# Patient Record
Sex: Female | Born: 1935
Health system: Southern US, Community
[De-identification: ages and names within clinical notes are randomized; demographics above are authoritative.]

## PROBLEM LIST (undated history)

## (undated) DIAGNOSIS — Z7901 Long term (current) use of anticoagulants: Secondary | ICD-10-CM

## (undated) DIAGNOSIS — I482 Chronic atrial fibrillation, unspecified: Secondary | ICD-10-CM

## (undated) DIAGNOSIS — I10 Essential (primary) hypertension: Secondary | ICD-10-CM

## (undated) DIAGNOSIS — K219 Gastro-esophageal reflux disease without esophagitis: Secondary | ICD-10-CM

## (undated) DIAGNOSIS — I639 Cerebral infarction, unspecified: Secondary | ICD-10-CM

## (undated) DIAGNOSIS — I519 Heart disease, unspecified: Secondary | ICD-10-CM

## (undated) DIAGNOSIS — Z8679 Personal history of other diseases of the circulatory system: Secondary | ICD-10-CM

## (undated) DIAGNOSIS — E7800 Pure hypercholesterolemia, unspecified: Secondary | ICD-10-CM

## (undated) DIAGNOSIS — I252 Old myocardial infarction: Secondary | ICD-10-CM

## (undated) DIAGNOSIS — N8110 Cystocele, unspecified: Secondary | ICD-10-CM

## (undated) DIAGNOSIS — E039 Hypothyroidism, unspecified: Secondary | ICD-10-CM

## (undated) DIAGNOSIS — Z8774 Personal history of (corrected) congenital malformations of heart and circulatory system: Secondary | ICD-10-CM

## (undated) DIAGNOSIS — C801 Malignant (primary) neoplasm, unspecified: Secondary | ICD-10-CM

## (undated) HISTORY — DX: Personal history of other diseases of the circulatory system: Z86.79

## (undated) HISTORY — PX: CHOLECYSTECTOMY: SHX55

## (undated) HISTORY — DX: Chronic atrial fibrillation, unspecified: I48.20

## (undated) HISTORY — DX: Malignant (primary) neoplasm, unspecified: C80.1

## (undated) HISTORY — DX: Old myocardial infarction: I25.2

## (undated) HISTORY — DX: Cerebral infarction, unspecified: I63.9

## (undated) HISTORY — DX: Essential (primary) hypertension: I10

## (undated) HISTORY — PX: CATARACT EXTRACTION: SUR2

## (undated) HISTORY — DX: Heart disease, unspecified: I51.9

## (undated) HISTORY — PX: ABDOMINAL HYSTERECTOMY: SHX81

## (undated) HISTORY — DX: Hypothyroidism, unspecified: E03.9

## (undated) HISTORY — DX: Gastro-esophageal reflux disease without esophagitis: K21.9

## (undated) HISTORY — PX: OTHER SURGICAL HISTORY: SHX169

## (undated) HISTORY — PX: TONSILLECTOMY: SUR1361

## (undated) HISTORY — DX: Personal history of (corrected) congenital malformations of heart and circulatory system: Z87.74

## (undated) HISTORY — DX: Chronic diastolic (congestive) heart failure: I50.32

## (undated) HISTORY — DX: Long term (current) use of anticoagulants: Z79.01

## (undated) HISTORY — DX: Pure hypercholesterolemia, unspecified: E78.00

---

## 1998-03-15 ENCOUNTER — Other Ambulatory Visit: Admission: RE | Admit: 1998-03-15 | Discharge: 1998-03-15 | Payer: Self-pay | Admitting: Gastroenterology

## 1998-03-24 ENCOUNTER — Inpatient Hospital Stay (HOSPITAL_COMMUNITY): Admission: EM | Admit: 1998-03-24 | Discharge: 1998-03-26 | Payer: Self-pay | Admitting: Internal Medicine

## 1998-04-28 ENCOUNTER — Emergency Department (HOSPITAL_COMMUNITY): Admission: EM | Admit: 1998-04-28 | Discharge: 1998-04-28 | Payer: Self-pay | Admitting: Emergency Medicine

## 1998-07-10 ENCOUNTER — Encounter: Admission: RE | Admit: 1998-07-10 | Discharge: 1998-10-08 | Payer: Self-pay | Admitting: Internal Medicine

## 1998-08-09 ENCOUNTER — Ambulatory Visit (HOSPITAL_COMMUNITY): Admission: RE | Admit: 1998-08-09 | Discharge: 1998-08-09 | Payer: Self-pay | Admitting: Gastroenterology

## 1998-09-27 ENCOUNTER — Other Ambulatory Visit: Admission: RE | Admit: 1998-09-27 | Discharge: 1998-09-27 | Payer: Self-pay | Admitting: Obstetrics and Gynecology

## 1998-11-21 ENCOUNTER — Emergency Department (HOSPITAL_COMMUNITY): Admission: EM | Admit: 1998-11-21 | Discharge: 1998-11-21 | Payer: Self-pay | Admitting: Internal Medicine

## 1999-03-14 ENCOUNTER — Encounter: Payer: Self-pay | Admitting: Gastroenterology

## 1999-03-14 ENCOUNTER — Ambulatory Visit (HOSPITAL_COMMUNITY): Admission: RE | Admit: 1999-03-14 | Discharge: 1999-03-14 | Payer: Self-pay | Admitting: Gastroenterology

## 1999-09-18 ENCOUNTER — Emergency Department (HOSPITAL_COMMUNITY): Admission: EM | Admit: 1999-09-18 | Discharge: 1999-09-18 | Payer: Self-pay | Admitting: Emergency Medicine

## 1999-09-19 ENCOUNTER — Encounter: Payer: Self-pay | Admitting: Emergency Medicine

## 1999-12-21 ENCOUNTER — Other Ambulatory Visit: Admission: RE | Admit: 1999-12-21 | Discharge: 1999-12-21 | Payer: Self-pay | Admitting: Obstetrics and Gynecology

## 2000-03-17 ENCOUNTER — Ambulatory Visit (HOSPITAL_COMMUNITY): Admission: RE | Admit: 2000-03-17 | Discharge: 2000-03-17 | Payer: Self-pay | Admitting: Gastroenterology

## 2000-03-25 ENCOUNTER — Encounter: Payer: Self-pay | Admitting: Emergency Medicine

## 2000-03-25 ENCOUNTER — Emergency Department (HOSPITAL_COMMUNITY): Admission: EM | Admit: 2000-03-25 | Discharge: 2000-03-25 | Payer: Self-pay | Admitting: Emergency Medicine

## 2000-03-28 ENCOUNTER — Encounter: Payer: Self-pay | Admitting: General Surgery

## 2000-03-28 ENCOUNTER — Observation Stay (HOSPITAL_COMMUNITY): Admission: RE | Admit: 2000-03-28 | Discharge: 2000-03-29 | Payer: Self-pay | Admitting: General Surgery

## 2000-08-20 ENCOUNTER — Encounter: Admission: RE | Admit: 2000-08-20 | Discharge: 2000-08-20 | Payer: Self-pay | Admitting: Family Medicine

## 2000-08-20 ENCOUNTER — Encounter: Payer: Self-pay | Admitting: Family Medicine

## 2000-11-17 ENCOUNTER — Encounter: Payer: Self-pay | Admitting: Emergency Medicine

## 2000-11-17 ENCOUNTER — Inpatient Hospital Stay (HOSPITAL_COMMUNITY): Admission: EM | Admit: 2000-11-17 | Discharge: 2000-11-18 | Payer: Self-pay | Admitting: Emergency Medicine

## 2002-03-08 ENCOUNTER — Other Ambulatory Visit: Admission: RE | Admit: 2002-03-08 | Discharge: 2002-03-08 | Payer: Self-pay | Admitting: Obstetrics and Gynecology

## 2002-09-22 ENCOUNTER — Ambulatory Visit (HOSPITAL_COMMUNITY): Admission: RE | Admit: 2002-09-22 | Discharge: 2002-09-22 | Payer: Self-pay | Admitting: Gastroenterology

## 2003-02-12 ENCOUNTER — Encounter: Payer: Self-pay | Admitting: Emergency Medicine

## 2003-02-12 ENCOUNTER — Encounter: Payer: Self-pay | Admitting: Family Medicine

## 2003-02-12 ENCOUNTER — Inpatient Hospital Stay (HOSPITAL_COMMUNITY): Admission: EM | Admit: 2003-02-12 | Discharge: 2003-02-15 | Payer: Self-pay | Admitting: Emergency Medicine

## 2003-03-30 ENCOUNTER — Ambulatory Visit (HOSPITAL_COMMUNITY): Admission: RE | Admit: 2003-03-30 | Discharge: 2003-03-30 | Payer: Self-pay | Admitting: Cardiology

## 2004-04-05 ENCOUNTER — Encounter: Admission: RE | Admit: 2004-04-05 | Discharge: 2004-04-05 | Payer: Self-pay | Admitting: Obstetrics and Gynecology

## 2004-06-06 ENCOUNTER — Ambulatory Visit (HOSPITAL_COMMUNITY): Admission: RE | Admit: 2004-06-06 | Discharge: 2004-06-06 | Payer: Self-pay | Admitting: Obstetrics and Gynecology

## 2004-10-25 ENCOUNTER — Encounter: Admission: RE | Admit: 2004-10-25 | Discharge: 2004-10-25 | Payer: Self-pay | Admitting: Obstetrics and Gynecology

## 2004-12-20 ENCOUNTER — Ambulatory Visit (HOSPITAL_COMMUNITY): Admission: RE | Admit: 2004-12-20 | Discharge: 2004-12-20 | Payer: Self-pay | Admitting: Surgery

## 2005-05-02 ENCOUNTER — Emergency Department (HOSPITAL_COMMUNITY): Admission: EM | Admit: 2005-05-02 | Discharge: 2005-05-03 | Payer: Self-pay | Admitting: Emergency Medicine

## 2005-08-11 ENCOUNTER — Inpatient Hospital Stay (HOSPITAL_COMMUNITY): Admission: EM | Admit: 2005-08-11 | Discharge: 2005-08-12 | Payer: Self-pay | Admitting: *Deleted

## 2005-10-12 ENCOUNTER — Emergency Department (HOSPITAL_COMMUNITY): Admission: EM | Admit: 2005-10-12 | Discharge: 2005-10-12 | Payer: Self-pay | Admitting: Emergency Medicine

## 2005-11-06 ENCOUNTER — Encounter: Admission: RE | Admit: 2005-11-06 | Discharge: 2005-11-06 | Payer: Self-pay | Admitting: Obstetrics and Gynecology

## 2005-12-09 ENCOUNTER — Ambulatory Visit (HOSPITAL_COMMUNITY): Admission: RE | Admit: 2005-12-09 | Discharge: 2005-12-09 | Payer: Self-pay | Admitting: Surgery

## 2007-04-02 ENCOUNTER — Encounter: Admission: RE | Admit: 2007-04-02 | Discharge: 2007-04-02 | Payer: Self-pay | Admitting: Obstetrics and Gynecology

## 2008-04-04 ENCOUNTER — Encounter: Admission: RE | Admit: 2008-04-04 | Discharge: 2008-04-04 | Payer: Self-pay | Admitting: Obstetrics and Gynecology

## 2008-06-15 DIAGNOSIS — Z8679 Personal history of other diseases of the circulatory system: Secondary | ICD-10-CM

## 2008-06-15 HISTORY — DX: Personal history of other diseases of the circulatory system: Z86.79

## 2008-08-23 ENCOUNTER — Encounter: Admission: RE | Admit: 2008-08-23 | Discharge: 2008-08-23 | Payer: Self-pay | Admitting: Surgery

## 2008-08-23 ENCOUNTER — Encounter (INDEPENDENT_AMBULATORY_CARE_PROVIDER_SITE_OTHER): Payer: Self-pay | Admitting: Interventional Radiology

## 2008-08-23 ENCOUNTER — Other Ambulatory Visit: Admission: RE | Admit: 2008-08-23 | Discharge: 2008-08-23 | Payer: Self-pay | Admitting: Surgery

## 2008-10-10 ENCOUNTER — Encounter (INDEPENDENT_AMBULATORY_CARE_PROVIDER_SITE_OTHER): Payer: Self-pay | Admitting: Surgery

## 2008-10-10 ENCOUNTER — Ambulatory Visit (HOSPITAL_COMMUNITY): Admission: RE | Admit: 2008-10-10 | Discharge: 2008-10-11 | Payer: Self-pay | Admitting: Surgery

## 2008-11-22 ENCOUNTER — Encounter (HOSPITAL_COMMUNITY): Admission: RE | Admit: 2008-11-22 | Discharge: 2009-02-20 | Payer: Self-pay | Admitting: Internal Medicine

## 2008-12-30 ENCOUNTER — Emergency Department (HOSPITAL_COMMUNITY): Admission: EM | Admit: 2008-12-30 | Discharge: 2008-12-30 | Payer: Self-pay | Admitting: Emergency Medicine

## 2009-04-10 ENCOUNTER — Encounter: Admission: RE | Admit: 2009-04-10 | Discharge: 2009-04-10 | Payer: Self-pay | Admitting: Internal Medicine

## 2009-04-26 ENCOUNTER — Encounter: Admission: RE | Admit: 2009-04-26 | Discharge: 2009-04-26 | Payer: Self-pay | Admitting: Family Medicine

## 2010-01-01 ENCOUNTER — Encounter (HOSPITAL_COMMUNITY): Admission: RE | Admit: 2010-01-01 | Discharge: 2010-03-20 | Payer: Self-pay | Admitting: Internal Medicine

## 2010-02-22 ENCOUNTER — Encounter: Admission: RE | Admit: 2010-02-22 | Discharge: 2010-02-22 | Payer: Self-pay | Admitting: Gastroenterology

## 2010-03-23 ENCOUNTER — Ambulatory Visit: Payer: Self-pay | Admitting: Cardiology

## 2010-04-24 ENCOUNTER — Ambulatory Visit: Payer: Self-pay | Admitting: Cardiology

## 2010-04-27 ENCOUNTER — Encounter: Admission: RE | Admit: 2010-04-27 | Discharge: 2010-04-27 | Payer: Self-pay | Admitting: Obstetrics and Gynecology

## 2010-05-25 ENCOUNTER — Ambulatory Visit: Payer: Self-pay | Admitting: Cardiology

## 2010-06-26 ENCOUNTER — Ambulatory Visit: Payer: Self-pay | Admitting: Cardiovascular Disease

## 2010-07-27 ENCOUNTER — Ambulatory Visit: Payer: Self-pay | Admitting: Cardiology

## 2010-08-28 ENCOUNTER — Ambulatory Visit: Payer: Self-pay | Admitting: Cardiology

## 2010-09-06 ENCOUNTER — Encounter
Admission: RE | Admit: 2010-09-06 | Discharge: 2010-09-06 | Payer: Self-pay | Source: Home / Self Care | Attending: Internal Medicine | Admitting: Internal Medicine

## 2010-09-09 ENCOUNTER — Encounter: Payer: Self-pay | Admitting: Surgery

## 2010-09-09 ENCOUNTER — Encounter: Payer: Self-pay | Admitting: Obstetrics and Gynecology

## 2010-09-09 ENCOUNTER — Encounter: Payer: Self-pay | Admitting: Internal Medicine

## 2010-09-25 ENCOUNTER — Encounter (INDEPENDENT_AMBULATORY_CARE_PROVIDER_SITE_OTHER): Payer: Medicare Other

## 2010-09-25 DIAGNOSIS — Z7901 Long term (current) use of anticoagulants: Secondary | ICD-10-CM

## 2010-09-25 DIAGNOSIS — I4892 Unspecified atrial flutter: Secondary | ICD-10-CM

## 2010-10-09 ENCOUNTER — Encounter (INDEPENDENT_AMBULATORY_CARE_PROVIDER_SITE_OTHER): Payer: Medicare Other

## 2010-10-09 DIAGNOSIS — Z7901 Long term (current) use of anticoagulants: Secondary | ICD-10-CM

## 2010-10-09 DIAGNOSIS — I4892 Unspecified atrial flutter: Secondary | ICD-10-CM

## 2010-10-23 ENCOUNTER — Encounter (INDEPENDENT_AMBULATORY_CARE_PROVIDER_SITE_OTHER): Payer: Medicare Other

## 2010-10-23 DIAGNOSIS — Z7901 Long term (current) use of anticoagulants: Secondary | ICD-10-CM

## 2010-10-23 DIAGNOSIS — I4892 Unspecified atrial flutter: Secondary | ICD-10-CM

## 2010-11-01 ENCOUNTER — Ambulatory Visit (INDEPENDENT_AMBULATORY_CARE_PROVIDER_SITE_OTHER): Payer: Medicare Other | Admitting: Cardiology

## 2010-11-01 DIAGNOSIS — Z7901 Long term (current) use of anticoagulants: Secondary | ICD-10-CM

## 2010-11-01 DIAGNOSIS — Z79899 Other long term (current) drug therapy: Secondary | ICD-10-CM

## 2010-11-01 DIAGNOSIS — I4891 Unspecified atrial fibrillation: Secondary | ICD-10-CM

## 2010-11-05 LAB — THYROGLOBULIN ANTIBODY: Thyroglobulin Ab: 36.2 U/mL (ref 0.0–60.0)

## 2010-11-05 LAB — THYROGLOBULIN LEVEL: Thyroglobulin: 0.7 ng/mL (ref 0.0–55.0)

## 2010-11-05 LAB — TSH: TSH: 69.104 u[IU]/mL — ABNORMAL HIGH (ref 0.350–4.500)

## 2010-11-07 ENCOUNTER — Telehealth: Payer: Self-pay | Admitting: *Deleted

## 2010-11-07 NOTE — Telephone Encounter (Signed)
Patient phoned c/o left wrist /hand pain. Was getting better, but some aching in elbow.  Advised to contact pcp per dr Patty Sermons.

## 2010-11-27 LAB — CBC
RBC: 4.3 MIL/uL (ref 3.87–5.11)
RDW: 17.9 % — ABNORMAL HIGH (ref 11.5–15.5)
WBC: 4.6 10*3/uL (ref 4.0–10.5)

## 2010-11-27 LAB — POCT I-STAT, CHEM 8
BUN: 15 mg/dL (ref 6–23)
Calcium, Ion: 1.01 mmol/L — ABNORMAL LOW (ref 1.12–1.32)
HCT: 42 % (ref 36.0–46.0)
Sodium: 139 mEq/L (ref 135–145)
TCO2: 25 mmol/L (ref 0–100)

## 2010-11-27 LAB — DIFFERENTIAL
Basophils Absolute: 0 10*3/uL (ref 0.0–0.1)
Eosinophils Absolute: 0.1 10*3/uL (ref 0.0–0.7)
Lymphocytes Relative: 28 % (ref 12–46)
Neutro Abs: 2.8 10*3/uL (ref 1.7–7.7)

## 2010-11-29 ENCOUNTER — Encounter: Payer: Self-pay | Admitting: *Deleted

## 2010-11-29 ENCOUNTER — Other Ambulatory Visit: Payer: Self-pay | Admitting: Dermatology

## 2010-12-03 ENCOUNTER — Ambulatory Visit (INDEPENDENT_AMBULATORY_CARE_PROVIDER_SITE_OTHER): Payer: Medicare Other | Admitting: *Deleted

## 2010-12-03 DIAGNOSIS — Z7901 Long term (current) use of anticoagulants: Secondary | ICD-10-CM

## 2010-12-03 DIAGNOSIS — I4891 Unspecified atrial fibrillation: Secondary | ICD-10-CM

## 2010-12-03 LAB — POCT INR: INR: 2.6

## 2010-12-04 LAB — BASIC METABOLIC PANEL
BUN: 13 mg/dL (ref 6–23)
Calcium: 9.6 mg/dL (ref 8.4–10.5)
Chloride: 102 mEq/L (ref 96–112)
Creatinine, Ser: 0.93 mg/dL (ref 0.4–1.2)
GFR calc Af Amer: >60 mL/min (ref >60)
GFR calc non Af Amer: 58 mL/min — ABNORMAL LOW (ref >60)

## 2010-12-04 LAB — CALCIUM: Calcium: 9.1 mg/dL (ref 8.4–10.5)

## 2010-12-04 LAB — DIFFERENTIAL
Eosinophils Absolute: 0.2 10*3/uL (ref 0.0–0.7)
Lymphocytes Relative: 49 % — ABNORMAL HIGH (ref 12–46)
Lymphs Abs: 2.7 10*3/uL (ref 0.7–4.0)
Monocytes Relative: 12 % (ref 3–12)
Neutro Abs: 1.9 10*3/uL (ref 1.7–7.7)
Neutrophils Relative %: 35 % — ABNORMAL LOW (ref 43–77)

## 2010-12-04 LAB — CBC
Platelets: 298 10*3/uL (ref 150–400)
RBC: 4.66 MIL/uL (ref 3.87–5.11)
WBC: 5.5 10*3/uL (ref 4.0–10.5)

## 2010-12-04 LAB — PROTIME-INR
INR: 1.2 (ref 0.00–1.49)
INR: 2.7 — ABNORMAL HIGH (ref 0.00–1.49)
Prothrombin Time: 15.5 seconds — ABNORMAL HIGH (ref 11.6–15.2)
Prothrombin Time: 30.8 seconds — ABNORMAL HIGH (ref 11.6–15.2)

## 2010-12-04 LAB — URINALYSIS, ROUTINE W REFLEX MICROSCOPIC
Hgb urine dipstick: NEGATIVE
Nitrite: NEGATIVE
Protein, ur: NEGATIVE mg/dL
Specific Gravity, Urine: 1.008 (ref 1.005–1.030)
Urobilinogen, UA: 0.2 mg/dL (ref 0.0–1.0)

## 2010-12-04 LAB — URINE MICROSCOPIC-ADD ON

## 2010-12-04 LAB — APTT: aPTT: 43 seconds — ABNORMAL HIGH (ref 24–37)

## 2011-01-01 ENCOUNTER — Ambulatory Visit (INDEPENDENT_AMBULATORY_CARE_PROVIDER_SITE_OTHER): Payer: Medicare Other | Admitting: *Deleted

## 2011-01-01 DIAGNOSIS — Z7901 Long term (current) use of anticoagulants: Secondary | ICD-10-CM

## 2011-01-01 DIAGNOSIS — I4891 Unspecified atrial fibrillation: Secondary | ICD-10-CM

## 2011-01-01 LAB — POCT INR: INR: 2.6

## 2011-01-01 NOTE — Op Note (Signed)
Kristen Whitehead, Kristen Whitehead           ACCOUNT NO.:  [id number]   MEDICAL RECORD NO.:  [id number]          PATIENT TYPE:  OIB   LOCATION:  5125                         FACILITY:  [facility]   PHYSICIAN:  Velora Heckler, MD      DATE OF BIRTH:  1923/05/09   DATE OF PROCEDURE:  10/10/2008  DATE OF DISCHARGE:                               OPERATIVE REPORT   PREOPERATIVE DIAGNOSIS:  Left thyroid nodule with cytologic atypia.   POSTOPERATIVE DIAGNOSIS:  Left thyroid nodule with cytologic atypia.   PROCEDURE:  Total thyroidectomy.   SURGEON:  Velora Heckler, MD, FACS   ANESTHESIA:  General per Dr. Autumn Patty.   ESTIMATED BLOOD LOSS:  Minimal.   PREPARATION:  Betadine.   COMPLICATIONS:  None.   INDICATIONS:  The patient is an 77 year old white female diagnosed with  thyroid nodules.  Ultrasound identified an abnormal-appearing 11.6-mm  nodule in the left thyroid lobe with calcifications.  Fine-needle  aspiration cytology was obtained and showed atypical follicular  epithelial cells, worrisome for malignancy.  The patient now comes to  Surgery for resection.   BODY OF REPORT:  Procedure was done in OR #60 at the [facility].  The patient was brought to the operating room and  placed in the supine position on the operating room table.  Following  administration of general anesthesia, the patient was positioned and  then prepped and draped in the usual strict aseptic fashion.  After  ascertaining that an adequate level of anesthesia had been achieved, a  Kocher incision was made with a #15 blade.  Dissection was carried  through the subcutaneous tissues.  Platysma was divided with  electrocautery.  Skin flaps were elevated cephalad and caudad from the  thyroid notch to the sternal notch.  A Mahorner self-retaining retractor  was placed for exposure.  Strap muscles were incised in the midline and  dissection was begun on the left side.  Strap muscles were reflected  laterally.  Strap muscles were mildly adherent to the left thyroid lobe.  Plane was developed with the electrocautery.  The middle thyroid vein  was divided between medium Ligaclips with the harmonic scalpel.  Superior pole vessels were divided between medium Ligaclips with the  harmonic scalpel.  Gland was rolled anteriorly.  Superior and inferior  parathyroid glands were identified and preserved on the vascular  pedicle.  Branches of the inferior thyroid artery were divided between  small Ligaclips with the harmonic scalpel.  Recurrent laryngeal nerve  was identified and preserved.  Gland was rolled further anteriorly.  Ligament of Allyson Sabal was transected with electrocautery and the gland was  dissected off the trachea.  Isthmus was mobilized across the midline.  A  moderate-sized pyramidal lobe was excised off the thyroid cartilage with  the electrocautery.  The dominant nodule in the mid left thyroid lobe  measures approximately 12 mm in size and it was quite firm, worrisome  for malignancy.   Next, we turned our attention to the right thyroid lobe.  Right thyroid  lobe was slightly larger in size.  It contained some subcentimeter  nodules.  Strap muscles were reflected laterally.  Middle thyroid vein  was divided between medium Ligaclips with the harmonic scalpel.  Superior pole was dissected out.  Superior pole vessels were divided  between medium Ligaclips with the harmonic scalpel.  Parathyroid tissue  was identified and preserved.  Recurrent laryngeal nerve was identified  and preserved.  Inferior venous tributaries were divided between medium  Ligaclips with the harmonic scalpel.  Ligament of Allyson Sabal was transected  with electrocautery and the gland was rolled onto the anterior trachea  and fully excised off the anterior trachea with the electrocautery.  The  entire thyroid was submitted to pathology.  The sutures were used to  mark the left superior pole.  Neck was irrigated with  warm saline.  Surgicel was placed in the operative field.  Good hemostasis was noted  bilaterally.  Strap muscles were reapproximated in the midline with  interrupted 3-0 Vicryl sutures.  Platysma was closed with interrupted 3-  0 Vicryl sutures.  Skin was closed with a running 4-0 Monocryl  subcuticular suture.  Wound was washed and dried.  Benzoin and Steri-  Strips were applied.  Sterile dressings were applied.  The patient was  awakened from anesthesia and brought to the recovery room in stable  condition.  The patient tolerated the procedure well.      Velora Heckler, MD  Electronically Signed     TMG/MEDQ  D:  10/10/2008  T:  10/10/2008  Job:  [id number]   cc:   Cassell Clement, M.D.  Juluis Mire, M.D.

## 2011-01-04 NOTE — Op Note (Signed)
NAME:  Kristen Whitehead, Kristen Whitehead                     ACCOUNT NO.:  [id number]   MEDICAL RECORD NO.:  [id number]                   PATIENT TYPE:  AMB   LOCATION:  ENDO                                 FACILITY:  [facility]   PHYSICIAN:  Bernette Redbird, M.D.                DATE OF BIRTH:  1923/01/05   DATE OF PROCEDURE:  09/22/2002  DATE OF DISCHARGE:                                 OPERATIVE REPORT   PROCEDURE:  Colonoscopy.   INDICATIONS FOR PROCEDURE:  A 77 year old female with recent hematochezia  after going on Coumadin for cardiac arrhythmias.   FINDINGS:  Sigmoid diverticulosis, prominent internal hemorrhoids.   DESCRIPTION OF PROCEDURE:  The nature, purpose, and risks of the procedure  were familiar to the patient from prior examination.  She provided written  consent.  Sedation was fentanyl 75 mcg and Versed 7.5 mg IV without  arrhythmias or desaturation.  The patient was hypotensive with measured  blood pressure in the 60 systolic range at the end of the procedure, but  without diaphoresis or any cardiac rhythm problem or hypoxemia. The patient  was arousable at this time. She was placed in Trendelenburg and given  further IV fluids.   The Olympus adjustable tension pediatric video colonoscope was advanced  without to much difficulty to the cecum as identified by typical cecal  appearance including the ileocecal valve. Pullback was then performed. The  quality of the prep was excellent and it is felt that all areas were well  seen.   There was rather extensive diverticulosis which I think involved the right  side of the colon as well as the left side.  No polyps, cancer, or colitis  were observed. Specifically, there was no evidence of ischemic colitis.  Retroflexion in the rectum as well as reinspection in the rectosigmoid was  unremarkable. There were internal hemorrhoids observed during retroflexion  and pullout through the anal canal and there was quite a bit of sigmoid  muscular thickening as well as the above mentioned diverticulosis in that  region.   No biopsies were obtained. The patient tolerated the procedure well and  there were no apparent complications.   PLAN:  The patient will be observed in recovery for restitution of normal  blood pressure.   I will get in touch with her cardiologist.  My best impression is that the  patient's rectal bleeding probably arose from her hemorrhoids based on the  character and pattern of the bleeding, although, a diverticular bleed would  also be a possibility.                                               Bernette Redbird, M.D.    RB/MEDQ  D:  09/22/2002  T:  09/22/2002  Job:  [id number]   cc:   Maisie Fus  Patty Sermons, M.D.  [address]  [place]  [place] [address]  Fax: [phone]   Marjory Lies, M.D.  [address]  [place]  [place] [address]  Fax: [phone]

## 2011-01-04 NOTE — H&P (Signed)
NAMECHRISTASIA, [name]           ACCOUNT NO.:  [id number]   MEDICAL RECORD NO.:  [id number]          PATIENT TYPE:  INP   LOCATION:  2005                         FACILITY:  [facility]   PHYSICIAN:  Kristen Whitehead, M.D.DATE OF BIRTH:  08/03/1923   DATE OF ADMISSION:  08/11/2005  DATE OF DISCHARGE:                                HISTORY & PHYSICAL   REASON FOR ADMISSION:  Left arm and chest discomfort and atrial  fibrillation.   HISTORY:  The patient is an 77 year old female admitted to the hospital to  rule out myocardial infarction and to evaluate for atrial fibrillation.  The  patient has a previous history of mitral valve prolapse, as well as  intermittent arrhythmias.  She had a previous TIA several years ago and was  in atrial fibrillation at that time and has been on Coumadin since then.  She had evidently an arm bleed in 2004 that was treated and was evaluated.  She had cardioversion done in August of 2004, converting back to sinus  rhythm, and has been maintained on propafenone since then.  She has been  under some stress taking care of her husband.  She was in the emergency room  with chest discomfort occurring while cooking dinner in September of this  year, and following that had a Cardiolite test which reportedly was  unremarkable.  Several weeks ago, she awoke with substernal chest discomfort  that was better if she laid still and did not move around.  She had this  episode for several days prior to that.  This then resolved.  This morning,  she awoke, and then when laying on her left side had the onset of fairly  significant left arm discomfort with radiation into her chest.  She took  nitroglycerin without relief, and felt as if her heart was irregular and  jumping out of her chest.  She presented to the emergency room and has been  found to be in atrial fibrillation.  She has not had any recurrent chest  pain, and is currently pain free, and her initial point of care  enzymes were  unremarkable, and her EKG was non-ischemic.  She complains that her heart is  irregular and has mild dyspnea and shortness of breath.  She has not had any  recent change in her medications.   PAST HISTORY:  1.  Remarkable for hypertension.  2.  Mitral valve prolapse.  3.  History of esophageal reflux.   PREVIOUS SURGERY:  1.  Cholecystectomy.  2.  Hysterectomy.  3.  Tonsillectomy.  4.  She has had a previous A&P bladder repair.   ALLERGIES:  None true allergies to medications.  Reports that  ERYTHROMYCIN,  TETRACYCLINE, TETANUS, DEMEROL and COMPAZINE make her nauseous.   CURRENT MEDICATIONS:  1.  Atenolol 25 mg  2.  Propafenone 150 t.i.d.  3.  Norvasc 5 mg.  4.  Tricor 145 mg daily.  5.  Protonix 40 mg daily.  6.  Coumadin 2.5 four days a week and 5 mg three days a week.  7.  Protonix 40 mg daily.  8.  Fosamax 70 mg.  9.  Furosemide 20 mg p.r.n.   SOCIAL HISTORY:  She lives at home with her elderly husband which is quite  disabled.  She is a nonsmoker.  Does not use alcohol to excess.  Attends  [employer].  She has a son who lives in [place].   REVIEW OF SYSTEMS:  She has been obese.  She has no skin problems.  She has  had bilateral cataract extractions previously.  Years ago, had a history of  vitreous deterioration that reportedly was miraculously healed.  No  difficulty hearing.  No difficult swallowing.  History of esophageal reflux.  History of bleeding diverticular disease in the past.  She has a history of  a prolapsed bladder and has some urinary incontinence, as well as urgency  and frequency.  She has a history of scoliosis, and also has a history of  celiac sprue.  She complains of some mild generalized arthritis.  History of  a TIA several years ago when she was in atrial fibrillation.  No definite  history of stroke or headache.  She is allergic to cats.  Other than as  noted above, the remainder of the review of systems is  unremarkable.   PHYSICAL EXAMINATION:  GENERAL:  She is an elderly woman who is polite and  is in no acute distress.  VITAL SIGNS:  Her blood pressure is 109/56, pulse is 74 and irregular.  SKIN:  Warm and dry.  HEENT:  Extraocular movements intact.  Pupils equal, round and reactive to  light and accommodation.  Conjunctivae and sclerae clear.  Fundi not  examined.  Pharynx negative.  NECK:  Supple without masses, JVD, thyromegaly, or bruits.  LUNGS:  Clear to auscultation and percussion.  CARDIAC:  Irregular rhythm.  Normal S1 and S2.  A mid-systolic click with  mild murmur noted.  ABDOMEN:  Soft, nontender.  Femoral and distal pulses are 3+.  There was no  edema noted.  NEUROLOGIC:  Normal.   EKG:  A 12-lead EKG shows atrial fibrillation with nonspecific ST  abnormality.   LABORATORY DATA:  Hemoglobin of 13.0, hematocrit 41.  Pro time INR is 1.9.  Sodium is 141, potassium 3.7, glucose 110.  Two point of care enzymes were  negative.   IMPRESSION:  1.  Chest and arm discomfort, rule out unstable angina.  2.  Atrial fibrillation with recurrence.  3.  Hypertensive heart disease.  4.  History of a transient ischemic attack.   RECOMMENDATIONS:  She will be admitted to a telemetry bed.  Continue  Coumadin at this time.  Rule out MI with serial enzymes.  Further work-up  per Dr. Patty Sermons.      Kristen Whitehead, M.D.  Electronically Signed     WST/MEDQ  D:  08/11/2005  T:  08/12/2005  Job:  [id number]   cc:   Kristen Whitehead, M.D.  Fax: [phone]   Kristen Whitehead, M.D.  Fax: [phone]

## 2011-01-04 NOTE — Discharge Summary (Signed)
NAME:  Kristen Whitehead, Kristen Whitehead                     ACCOUNT NO.:  [id number]   MEDICAL RECORD NO.:  [id number]                   PATIENT TYPE:  INP   LOCATION:  5702                                 FACILITY:  [facility]   PHYSICIAN:  Lonia Blood, M.D.            DATE OF BIRTH:  11-08-1922   DATE OF ADMISSION:  02/12/2003  DATE OF DISCHARGE:  02/15/2003                                 DISCHARGE SUMMARY   DISCHARGE DIAGNOSES:  1. Acute bleeding into right upper extremity secondary to supratherapeutic     INR, observation for compartment syndrome.     A. Celiac sprue.  2. Hypertension.  3. Degenerative joint disease of the spine.  4. Osteoarthritis.  5. Hyperlipidemia.  6. Atrial fibrillation on Coumadin.   DISCHARGE MEDICATIONS:  1. Coumadin 2.5 mg q.d.  2. No aspirin until further directed.  3. Evista 60 mg q.d.  4. Tenormin 25 mg q.d.  5. Norvasc 5 mg q.d.  6. Vioxx 12.5 mg q.d.  7. Tricor 160 mg q.d.  8. Protonix 40 mg q.d.  9. Imodium over-the-counter as directed.  10.      Vicodin 5/500 q.4h. p.r.n.   WOUND CARE:  Ace wrap to right arm.   FOLLOW UP:  1. Follow up with Dr. Amanda Pea, hand surgery, as directed.  2. Follow up with primary care doctor as needed.  3. Will check INR in two days.   CONSULTANTS:  Dr. Amanda Pea, hand surgery.   PROCEDURE:  X-ray of the right forearm negative for fracture.   HISTORY OF PRESENT ILLNESS:  Kristen Whitehead is a very pleasant 77 year old  female who was on chronic Coumadin for atrial fibrillation. She presented to  the emergency room with the development of acute swelling in the right arm.  On presentation to the emergency room, she was noted to have a markedly  enlarged right arm, and her INR was noted to be significantly elevated at  3.0. Evaluation revealed significant ecchymosis of the arm, and it was felt  that the patient likely was suffering a spontaneous bleed into her right  forearm. As a result, the patient was admitted to the  hospital for acute  care.   HOSPITAL COURSE:  Kristen Whitehead was admitted to the hospital to care for her  significant bleeding episode out of fear of compartment syndrome in the  right forearm. Dr. Amanda Pea, hand surgery, was consulted for evaluation. It  was not felt that the patient was suffering with compartment syndrome at  that time. The decision was made that she should be monitored closely. The  arm was immobilized, and serial enzymes were carried out. The Coumadin was  held. Fortunately, with close observation, the patient did improve  significantly. Coumadin was held until the patient's hemoglobin was stable,  and the compartment syndrome had been ruled out. Because of her atrial  fibrillation, it is felt that ongoing Coumadin is appropriate, and this was  agreed to by  Dr. Amanda Pea. It is recommended, however, that INR be kept closer  to the lower end of the therapeutic range with a goal of approximately 2.0.  By February 15, 2003, the patient was noted to have stable blood pressure and  vitals. Hemoglobin was stable at around 12.5. INR had decreased down to 1.6  with simple holding of Coumadin, and therefore, the patient's Coumadin was  resumed. She was advised to not use aspirin until followup with Dr. Amanda Pea  and until he clears her to do so. Otherwise, the patient will continue her  above  listed medications.                                                Lonia Blood, M.D.    JTM/MEDQ  D:  03/23/2003  T:  03/24/2003  Job:  [id number]

## 2011-01-04 NOTE — Op Note (Signed)
   NAME:  Kristen Whitehead, Kristen Whitehead                     ACCOUNT NO.:  [id number]   MEDICAL RECORD NO.:  [id number]                   PATIENT TYPE:  OIB   LOCATION:  2869                                 FACILITY:  [facility]   PHYSICIAN:  Cassell Clement, M.D.              DATE OF BIRTH:  1923/06/18   DATE OF PROCEDURE:  03/30/2003  DATE OF DISCHARGE:  03/30/2003                                 OPERATIVE REPORT   PROCEDURE:  DC cardioversion.   HISTORY:  This 77 year old woman has been followed with atrial fibrillation.  She has failed to convert on outpatient therapy.  She has been on long-term  Coumadin.  She is brought in now for elective electrical cardioversion.   HOSPITAL COURSE:  After 150 mg of IV Pentothal by Dr. Jacklynn Bue, the patient  was given a single 400 watt second shock using the biphasic defibrillator  and AP pads.  She converted promptly from atrial fibrillation to normal  sinus rhythm with first-degree AV block.  She tolerated the procedure well.  There were no immediate post-cardioversion complications.   The patient is to continue her present medications and to be rechecked in  the office in one week for office visit pro time and electrocardiogram.                                                    Cassell Clement, M.D.    TB/MEDQ  D:  03/30/2003  T:  03/31/2003  Job:  [id number]   cc:   Evelena Peat, M.D.  [facility]   Burna Forts, M.D.  [facility] [address], [place]  [place]  [place] [address]  Fax: [phone]

## 2011-01-04 NOTE — H&P (Signed)
NAME:  Kristen Whitehead, GAVEL                     ACCOUNT NO.:  [id number]   MEDICAL RECORD NO.:  [id number]                   PATIENT TYPE:  INP   LOCATION:  5702                                 FACILITY:  [facility]   PHYSICIAN:  Teena Irani. Arlyce Dice, M.D.               DATE OF BIRTH:  [birth year]   DATE OF ADMISSION:  02/12/2003  DATE OF DISCHARGE:                                HISTORY & PHYSICAL   CHIEF COMPLAINT:  My arm hurts.   HISTORY OF PRESENT ILLNESS:  This 77 year old female was fine until she  awoke this morning.  She noted pain in her right forearm.  There was a sense  of it radiating up towards her chest.  She was concerned that this may have  something to do with her heart.  She called her cardiologist, Dr. Patty Sermons.  Dr. Deborah Chalk was on call and he recommended that she come to the emergency  room.  In the emergency room, she was assessed by Lorre Nick, M.D.  He  found no evidence of acute cardiac problem, but she did have a hard, swollen  right forearm with some ecchymosis.  Because of the concern that being on  warfarin therapy that she may continue to bleed into that forearm, she is  admitted for observation.   She is on chronic warfarin therapy because of atrial fibrillation.  Her INR  today was 3.0.   PAST MEDICAL HISTORY:  1. Significant in that she has valvular heart disease which is asymptomatic.  2. She has celiac sprue which is diet controlled and monitored by Bernette Redbird, M.D.  3. She had childbirth x 3.  4. Hypertension.  5. DJD of her spine.  6. Osteoporosis.  7. Hyperlipidemia.   PAST SURGICAL HISTORY:  She had prior surgeries which included:  1. T&A in childhood.  2. Cholecystectomy.  3. Total abdominal hysterectomy.  4. AP repair.  Despite that, she has some urinary incontinence and she uses     estrogen vaginal cream plus Maxitrol patch for this.   CURRENT MEDICATIONS:  1. Coumadin.  2. Fosamax 70 mg.  3. Evista 60 mg.  4. Xanax 0.5 mg  h.s. p.r.n. sleep.  5. Atenolol 50 mg a day.  6. Norvasc 5 mg a day.  7. Celebrex 200 mg a day.  8. Aspirin 81 mg a day.  9. Furosemide 20 mg p.r.n.  10.      TriCor in its generic form 200 mg.   ALLERGIES:  She is allergic to IBUPROFEN, ERYTHROMYCIN, DEMEROL, TETANUS,  TETRACYCLINE, COMPAZINE, and ADRENALINE.   FAMILY AND SOCIAL HISTORY:  She does not smoke or use alcohol.  She does not  public work.  She lives with her spouse.  Three grown daughters live in this  area.   PHYSICAL EXAMINATION:  VITAL SIGNS:  Temperature 98.6 degrees, blood  pressure 136/51, pulse 70, respiratory rate 20, pulse oximetry 95.  GENERAL APPEARANCE:  She is alert, pleasant, and in no distress, but  complaining of right forearm pain.  HEENT:  Normocephalic.  ACs not obstructed.  TMs translucent.  No middle ear  fluid.  Pupils equal and reactive to light.  Fundi benign.  NECK:  Supple without nodes, masses, bruits, thyroid enlargement or tracheal  deviation.  There is no thyromegaly or bruits.  CHEST:  Expands symmetrically.  No wheezing, rhonchi, or rales.  HEART:  Normal size clinically.  There is an irregularly irregular rhythm  with an efficient rate of 70.  She has no murmurs, rubs, or gallops.  ABDOMEN:  Soft.  No masses, guarding, tenderness, or organomegaly.  She does  have right lower quadrant tenderness, however, which has been present for  20+ years.  EXTREMITIES:  No edema of the lower extremities.  No rash, atrophy, or  weakness.  The same with the left upper extremity, but the right upper  extremity shows full tight right forearm with ecchymosis of the medial  anterior forearm and tenderness to palpation is present.  CENTRAL NERVOUS SYSTEM:  Cranial nerves II-XII intact.  No gross motor or  sensory deficits.  Cerebellar function is intact.  Deep tendon reflexes are  present, equal, and brisk.   IMPRESSION:  1. Bleeding into the right forearm secondary to Coumadin.  2. Celiac sprue.  3.  Multiple surgeries.  4. Hypertension.  5. Degenerative joint disease of the spine.  6. Osteoporosis.  7. Hyperlipidemia.   PLAN:  1. Admit.  2. Consult Dr. Amanda Pea.  3. Observe for compartment syndrome.                                               Teena Irani. Arlyce Dice, M.D.    DMK/MEDQ  D:  02/12/2003  T:  02/13/2003  Job:  [id number]

## 2011-02-07 ENCOUNTER — Other Ambulatory Visit: Payer: Self-pay | Admitting: *Deleted

## 2011-02-07 DIAGNOSIS — F419 Anxiety disorder, unspecified: Secondary | ICD-10-CM

## 2011-02-09 MED ORDER — ALPRAZOLAM 0.5 MG PO TABS: 0.5000 mg | ORAL_TABLET | Freq: Every evening | ORAL | Status: DC | PRN

## 2011-02-11 ENCOUNTER — Ambulatory Visit (INDEPENDENT_AMBULATORY_CARE_PROVIDER_SITE_OTHER): Payer: Medicare Other | Admitting: Cardiology

## 2011-02-11 ENCOUNTER — Encounter: Payer: Self-pay | Admitting: Cardiology

## 2011-02-11 ENCOUNTER — Ambulatory Visit (INDEPENDENT_AMBULATORY_CARE_PROVIDER_SITE_OTHER): Payer: Medicare Other | Admitting: *Deleted

## 2011-02-11 ENCOUNTER — Ambulatory Visit: Payer: Medicare Other | Admitting: Cardiology

## 2011-02-11 VITALS — BP 120/70 | HR 80 | Wt 126.0 lb

## 2011-02-11 DIAGNOSIS — I4891 Unspecified atrial fibrillation: Secondary | ICD-10-CM

## 2011-02-11 DIAGNOSIS — E785 Hyperlipidemia, unspecified: Secondary | ICD-10-CM

## 2011-02-11 DIAGNOSIS — I059 Rheumatic mitral valve disease, unspecified: Secondary | ICD-10-CM

## 2011-02-11 DIAGNOSIS — I119 Hypertensive heart disease without heart failure: Secondary | ICD-10-CM

## 2011-02-11 DIAGNOSIS — E7800 Pure hypercholesterolemia, unspecified: Secondary | ICD-10-CM

## 2011-02-11 DIAGNOSIS — I340 Nonrheumatic mitral (valve) insufficiency: Secondary | ICD-10-CM | POA: Insufficient documentation

## 2011-02-11 DIAGNOSIS — Z79899 Other long term (current) drug therapy: Secondary | ICD-10-CM

## 2011-02-11 DIAGNOSIS — Z7901 Long term (current) use of anticoagulants: Secondary | ICD-10-CM

## 2011-02-11 DIAGNOSIS — E039 Hypothyroidism, unspecified: Secondary | ICD-10-CM | POA: Insufficient documentation

## 2011-02-11 DIAGNOSIS — G4700 Insomnia, unspecified: Secondary | ICD-10-CM

## 2011-02-11 DIAGNOSIS — I10 Essential (primary) hypertension: Secondary | ICD-10-CM | POA: Insufficient documentation

## 2011-02-11 LAB — POCT INR: INR: 1.9

## 2011-02-11 MED ORDER — TEMAZEPAM 15 MG PO CAPS: 15.0000 mg | ORAL_CAPSULE | Freq: Every evening | ORAL | Status: DC | PRN

## 2011-02-11 NOTE — Progress Notes (Signed)
Kristen Whitehead Date of Birth:  06/12/1923 [facility] / [facility] [address]   [address] [place], [place]  [address] [phone]           Fax   [phone]  HPI: This pleasant 39 her on his seen for a scheduled followup office visit.  She has a history of established atrial fibrillation.  She's had a history of mild mitral regurgitation possibly secondary to mitral valve prolapse per her last echocardiogram on 09/17/07 showed mild to moderate mitral regurgitation but no definite mitral valve prolapse.  She had normal systolic function and no segmental wall motion abnormalities and she was in atrial fibrillation.  He has not been expressing any TIA symptoms she has a history of hypothyroidism followed by Dr. Sharl Ma.  She's had a past history of mild essential hypertension.  Current Outpatient Prescriptions  Medication Sig Dispense Refill  . ALPRAZolam (XANAX) 0.5 MG tablet Take 1 tablet (0.5 mg total) by mouth at bedtime as needed.  45 tablet  3  . amLODipine (NORVASC) 5 MG tablet Take 2.5 mg by mouth daily. ( 1/2 tablet )       . atenolol (TENORMIN) 25 MG tablet Take 25 mg by mouth 2 (two) times daily.        . calcium gluconate 500 MG tablet Take 500 mg by mouth. 3 tablets daily       . Cholecalciferol (VITAMIN D) 2000 UNITS tablet Take 5,000 Units by mouth daily.       . cycloSPORINE (RESTASIS) 0.05 % ophthalmic emulsion Place 1 drop into both eyes daily.        Marland Kitchen estradiol (ESTRACE VAGINAL) 0.1 MG/GM vaginal cream Place 2 g vaginally. Twice a week      . fenofibrate (TRICOR) 145 MG tablet Take 145 mg by mouth daily.        . furosemide (LASIX) 40 MG tablet Take 40 mg by mouth daily.        . isosorbide dinitrate (ISORDIL) 30 MG tablet Take 30 mg by mouth daily.       Marland Kitchen levothyroxine (SYNTHROID, LEVOTHROID) 88 MCG tablet Take 88 mcg by mouth daily.        . nitroGLYCERIN (NITROSTAT) 0.4 MG SL tablet Place 0.4 mg under the tongue every 5 (five) minutes as needed.         . simvastatin (ZOCOR) 10 MG tablet Take 10 mg by mouth at bedtime.        . travoprost, benzalkonium, (TRAVATAN) 0.004 % ophthalmic solution Place 1 drop into both eyes at bedtime.        Marland Kitchen warfarin (COUMADIN) 5 MG tablet Take 5 mg by mouth as directed.        . temazepam (RESTORIL) 15 MG capsule Take 1 capsule (15 mg total) by mouth at bedtime as needed for sleep.  30 capsule  0    Allergies  Allergen Reactions  . Demerol   . Ebastine     EBS  . Ibuprofen   . Tetanus Toxoids   . Tetracyclines & Related     Patient Active Problem List  Diagnoses  . Atrial fibrillation  . Benign hypertensive heart disease without heart failure  . Mitral regurgitation  . Hypothyroidism  . Hypercholesterolemia    History  Smoking status  . Never Smoker   Smokeless tobacco  . Not on file    History  Alcohol Use No    Family History  Problem Relation Age of Onset  .  Heart attack Neg Hx   . Heart disease Neg Hx     Review of Systems: The patient denies any heat or cold intolerance.  No weight gain or weight loss.  The patient denies headaches or blurry vision.  There is no cough or sputum production.  The patient denies dizziness.  There is no hematuria or hematochezia.  The patient denies any muscle aches or arthritis.  The patient denies any rash.  The patient denies frequent falling or instability.  There is no history of depression or anxiety.  All other systems were reviewed and are negative.   Physical Exam: Filed Vitals:   02/11/11 1415  BP: 120/70  Pulse: 80  The general appearance feels a well-developed well-nourished elderly woman in no distress.Pupils equal and reactive.   Extraocular Movements are full.  There is no scleral icterus.  The mouth and pharynx are normal.  The neck is supple.  The carotids reveal no bruits.  The jugular venous pressure is normal.  The thyroid is not enlarged.  There is no lymphadenopathy.The chest is clear to percussion and auscultation. There  are no rales or rhonchi. Expansion of the chest is symmetrical.  The heart reveals a soft apical murmur of mitral regurgitation .The abdomen is soft and nontender. Bowel sounds are normal. The liver and spleen are not enlarged. There Are no abdominal masses. There are no bruits.  Normal extremity without phlebitis or edema.Strength is normal and symmetrical in all extremities.  There is no lateralizing weakness.  There are no sensory deficits.The skin is warm and dry.  There is no rash.    Assessment / Plan: Continue present medication.  INR is 1.9 and she'll return in 2 weeks for a followup protime will staying on the same dose.  She'll return in 3 months for followup office visit lipid panel chemistries and a CBC

## 2011-02-11 NOTE — Assessment & Plan Note (Signed)
The patient has a history of established atrial fibrillation and is on long-term Coumadin.  She has not been expressing any new cardiac symptoms.  She denies any chest pain or shortness of breath.  She has been on furosemide 20 mg twice a day and does have a lot of nocturia.  She feels that she could probably do just as well with a smaller dose and leaving off the evening dose.  She has not been sleeping well.  She goes to bed at 1 AM but falls asleep at about 4 or 5 am.  She does have nocturia 2-3 times during the night as well

## 2011-02-21 ENCOUNTER — Other Ambulatory Visit: Payer: Self-pay | Admitting: *Deleted

## 2011-02-21 DIAGNOSIS — I341 Nonrheumatic mitral (valve) prolapse: Secondary | ICD-10-CM

## 2011-02-21 MED ORDER — ISOSORBIDE MONONITRATE ER 30 MG PO TB24: 30.0000 mg | ORAL_TABLET | Freq: Every day | ORAL | Status: DC

## 2011-02-21 NOTE — Telephone Encounter (Signed)
Phoned refill for isosorbide mononitrate 30 mg.

## 2011-02-22 ENCOUNTER — Other Ambulatory Visit: Payer: Self-pay | Admitting: *Deleted

## 2011-02-22 DIAGNOSIS — G4700 Insomnia, unspecified: Secondary | ICD-10-CM

## 2011-02-27 ENCOUNTER — Other Ambulatory Visit: Payer: Self-pay | Admitting: *Deleted

## 2011-02-27 MED ORDER — TEMAZEPAM 15 MG PO CAPS: 15.0000 mg | ORAL_CAPSULE | Freq: Every evening | ORAL | Status: DC | PRN

## 2011-03-01 ENCOUNTER — Telehealth: Payer: Self-pay | Admitting: Cardiology

## 2011-03-01 ENCOUNTER — Ambulatory Visit (INDEPENDENT_AMBULATORY_CARE_PROVIDER_SITE_OTHER): Payer: Medicare Other | Admitting: *Deleted

## 2011-03-01 DIAGNOSIS — Z7901 Long term (current) use of anticoagulants: Secondary | ICD-10-CM

## 2011-03-01 DIAGNOSIS — I4891 Unspecified atrial fibrillation: Secondary | ICD-10-CM

## 2011-03-01 LAB — POCT INR: INR: 3

## 2011-03-01 NOTE — Telephone Encounter (Signed)
Will await results of protime

## 2011-03-01 NOTE — Telephone Encounter (Signed)
Pt said she has a few new bruises She wanted to talk you about

## 2011-03-01 NOTE — Telephone Encounter (Signed)
Spoke with [name] and patient to come today for protime

## 2011-03-11 ENCOUNTER — Encounter: Payer: Medicare Other | Admitting: *Deleted

## 2011-03-19 ENCOUNTER — Telehealth: Payer: Self-pay | Admitting: Cardiology

## 2011-03-19 ENCOUNTER — Ambulatory Visit (INDEPENDENT_AMBULATORY_CARE_PROVIDER_SITE_OTHER): Payer: Medicare Other | Admitting: *Deleted

## 2011-03-19 DIAGNOSIS — Z7901 Long term (current) use of anticoagulants: Secondary | ICD-10-CM

## 2011-03-19 DIAGNOSIS — I4891 Unspecified atrial fibrillation: Secondary | ICD-10-CM

## 2011-03-19 NOTE — Telephone Encounter (Signed)
Pt calling c/o more bruising noted on her legs and is concerned with her INR being to high;  Pt requesting an appt today to get this check;  appt given to pt;

## 2011-03-19 NOTE — Telephone Encounter (Signed)
Pt called said she has several bruises on her leg and she takes warfarin. She says that is sore to the touch please call she would like to see Dr Patty Sermons asap

## 2011-03-19 NOTE — Progress Notes (Signed)
Pt reports bruise to back of right calf. Note bruise to be approx 3" x 1" yellow/light purple in color. Pt reports she noted it last evening when taking off support hose. She does not have her hose on today due to them not being dry but usually wears them everyday.  She had no calf tenderndess except small area that she had to feel her leg several times to find. No redness or ankle swelling to lower extremities bilaterally. Positive pedal pulse.  She verbalizes understanding to call us if she has increase ankle swelling, redness, calf tenderness or increase in temperature to the lower extremity.

## 2011-03-29 ENCOUNTER — Encounter: Payer: Medicare Other | Admitting: *Deleted

## 2011-04-02 ENCOUNTER — Ambulatory Visit (INDEPENDENT_AMBULATORY_CARE_PROVIDER_SITE_OTHER): Payer: Medicare Other | Admitting: *Deleted

## 2011-04-02 DIAGNOSIS — Z7901 Long term (current) use of anticoagulants: Secondary | ICD-10-CM

## 2011-04-02 DIAGNOSIS — I4891 Unspecified atrial fibrillation: Secondary | ICD-10-CM

## 2011-04-08 ENCOUNTER — Ambulatory Visit (INDEPENDENT_AMBULATORY_CARE_PROVIDER_SITE_OTHER): Payer: Medicare Other | Admitting: Nurse Practitioner

## 2011-04-08 ENCOUNTER — Ambulatory Visit: Payer: Medicare Other | Admitting: Nurse Practitioner

## 2011-04-08 ENCOUNTER — Encounter: Payer: Self-pay | Admitting: Nurse Practitioner

## 2011-04-08 VITALS — BP 110/80 | HR 70 | Ht 61.5 in | Wt 125.2 lb

## 2011-04-08 DIAGNOSIS — R5383 Other fatigue: Secondary | ICD-10-CM

## 2011-04-08 DIAGNOSIS — I4891 Unspecified atrial fibrillation: Secondary | ICD-10-CM

## 2011-04-08 DIAGNOSIS — I10 Essential (primary) hypertension: Secondary | ICD-10-CM

## 2011-04-08 DIAGNOSIS — R5381 Other malaise: Secondary | ICD-10-CM

## 2011-04-08 DIAGNOSIS — I119 Hypertensive heart disease without heart failure: Secondary | ICD-10-CM

## 2011-04-08 LAB — BASIC METABOLIC PANEL
BUN: 13 mg/dL (ref 6–23)
CO2: 29 mEq/L (ref 19–32)
Calcium: 8.8 mg/dL (ref 8.4–10.5)
Chloride: 102 mEq/L (ref 96–112)
Creatinine, Ser: 0.8 mg/dL (ref 0.4–1.2)
GFR: 71.19 mL/min (ref >60.00)
Glucose, Bld: 96 mg/dL (ref 70–99)
Potassium: 4.7 mEq/L (ref 3.5–5.1)
Sodium: 139 mEq/L (ref 135–145)

## 2011-04-08 LAB — CBC WITH DIFFERENTIAL/PLATELET
Basophils Absolute: 0 10*3/uL (ref 0.0–0.1)
Basophils Relative: 0.6 % (ref 0.0–3.0)
Eosinophils Absolute: 0.1 10*3/uL (ref 0.0–0.7)
Eosinophils Relative: 2 % (ref 0.0–5.0)
HCT: 39.3 % (ref 36.0–46.0)
Hemoglobin: 13.2 g/dL (ref 12.0–15.0)
Lymphocytes Relative: 46.9 % — ABNORMAL HIGH (ref 12.0–46.0)
Lymphs Abs: 2.3 10*3/uL (ref 0.7–4.0)
MCHC: 33.5 g/dL (ref 30.0–36.0)
MCV: 94.1 fl (ref 78.0–100.0)
Monocytes Absolute: 0.6 10*3/uL (ref 0.1–1.0)
Monocytes Relative: 11.3 % (ref 3.0–12.0)
Neutro Abs: 1.9 10*3/uL (ref 1.4–7.7)
Neutrophils Relative %: 39.2 % — ABNORMAL LOW (ref 43.0–77.0)
Platelets: 261 10*3/uL (ref 150.0–400.0)
RBC: 4.18 Mil/uL (ref 3.87–5.11)
RDW: 14.9 % — ABNORMAL HIGH (ref 11.5–14.6)
WBC: 4.9 10*3/uL (ref 4.5–10.5)

## 2011-04-08 LAB — TSH: TSH: 0.19 u[IU]/mL — ABNORMAL LOW (ref 0.35–5.50)

## 2011-04-08 NOTE — Progress Notes (Signed)
Kristen Whitehead Date of Birth: 1923/07/11   History of Present Illness: Kristen Whitehead is seen today for a work in visit. She is seen for Dr. Patty Sermons. She has not really felt well for the past 3 weeks. She feels "lost". She has no energy. Last night her blood pressure was up and she felt like she had a belt tied tight around her waist. She was chilled. She had some indigestion and took pickle juice with relief. She did call EMS but refused to go the hospital. Today she just has no energy. She wants to know what is wrong. She is not sleeping. She has had no recent labs. Blood pressure normally runs on the lower side. No real chest pain. She has chronic atrial fib.   Current Outpatient Prescriptions on File Prior to Visit  Medication Sig Dispense Refill  . ALPRAZolam (XANAX) 0.5 MG tablet Take 1 tablet (0.5 mg total) by mouth at bedtime as needed.  45 tablet  3  . amLODipine (NORVASC) 5 MG tablet Take 2.5 mg by mouth daily. ( 1/2 tablet )       . atenolol (TENORMIN) 25 MG tablet Take 25 mg by mouth 2 (two) times daily.        . Cholecalciferol (VITAMIN D) 2000 UNITS tablet Take 5,000 Units by mouth daily.       . cycloSPORINE (RESTASIS) 0.05 % ophthalmic emulsion Place 1 drop into both eyes daily.        Marland Kitchen estradiol (ESTRACE VAGINAL) 0.1 MG/GM vaginal cream Place 2 g vaginally. Twice a week      . fenofibrate (TRICOR) 145 MG tablet Take 145 mg by mouth daily.        . furosemide (LASIX) 40 MG tablet Take 40 mg by mouth daily.        . isosorbide dinitrate (ISORDIL) 30 MG tablet Take 30 mg by mouth daily.       Marland Kitchen levothyroxine (SYNTHROID, LEVOTHROID) 88 MCG tablet Take 88 mcg by mouth daily.        . nitroGLYCERIN (NITROSTAT) 0.4 MG SL tablet Place 0.4 mg under the tongue every 5 (five) minutes as needed.        . simvastatin (ZOCOR) 10 MG tablet Take 10 mg by mouth at bedtime.        . travoprost, benzalkonium, (TRAVATAN) 0.004 % ophthalmic solution Place 1 drop into both eyes at  bedtime.        Marland Kitchen warfarin (COUMADIN) 5 MG tablet Take 5 mg by mouth as directed.          Allergies  Allergen Reactions  . Demerol   . Ebastine     EBS  . Ibuprofen   . Tetanus Toxoids   . Tetracyclines & Related     Past Medical History  Diagnosis Date  . History of mitral valve prolapse 06/15/2008  . Chronic atrial fibrillation   . Fatigue   . History of congenital mitral regurgitation     mild  . Edema   . Hypothyroidism   . Hypercholesterolemia   . GERD (gastroesophageal reflux disease)   . Chest pain   . Hypertension     Past Surgical History  Procedure Date  . Thyroidectomy   . Cholecystectomy     History  Smoking status  . Never Smoker   Smokeless tobacco  . Not on file    History  Alcohol Use No    Family History  Problem Relation Age of Onset  .  Heart attack Neg Hx   . Heart disease Neg Hx     Review of Systems: The review of systems is positive for significant fatigue. She wonders if she might be depressed.  All other systems were reviewed and are negative.  Physical Exam: BP 110/80  Pulse 70  Ht 5' 1.5" (1.562 m)  Wt 125 lb 3.2 oz (56.79 kg)  BMI 23.27 kg/m2 Patient is in no acute distress. Affect is flat. Skin is warm and dry. Color is normal.  HEENT is unremarkable. Normocephalic/atraumatic. PERRL. Sclera are nonicteric. Neck is supple. No masses. No JVD. Lungs are clear. Cardiac exam shows an irregular rhythm. Rate is controlled. Abdomen is soft. Extremities are without edema. Gait and ROM are intact. No gross neurologic deficits noted.   LABORATORY DATA:   Assessment / Plan:

## 2011-04-08 NOTE — Assessment & Plan Note (Signed)
Blood pressure is ok with her current regimen.

## 2011-04-08 NOTE — Patient Instructions (Signed)
We are going to check some labs today to see why you are not feeling well. For now, stay on your current medicines. See Dr. Patty Sermons as scheduled in September

## 2011-04-08 NOTE — Assessment & Plan Note (Signed)
This is chronic. She is on coumadin. Eating habits are very erratic. I tried to emphasize the importance of keeping her diet consistent with her coumadin therapy.

## 2011-04-08 NOTE — Assessment & Plan Note (Signed)
Will check her labs today. I wonder if she may just be depressed. We will see what the labs show. She is to stay on her current medicines. She has planned follow up with Dr. Patty Sermons next month. Patient is agreeable to this plan and will call if any problems develop in the interim.

## 2011-04-09 NOTE — Progress Notes (Signed)
No answer

## 2011-04-11 ENCOUNTER — Telehealth: Payer: Self-pay | Admitting: *Deleted

## 2011-04-11 NOTE — Progress Notes (Signed)
Patient states she doesn't want to change thyroid medications at this time.  Advised I would fax results to Dr Sharl Ma and let him handle it then.  States she will call his office and follow and try to get an appointment

## 2011-04-11 NOTE — Telephone Encounter (Signed)
Advised of labs. Patient states she does not want to change thyroid medication at this time.  Advise I would fax results to Dr Sharl Ma and she will call his office for follow up appointment.

## 2011-04-11 NOTE — Telephone Encounter (Signed)
Message copied by Burnell Blanks on Thu Apr 11, 2011  3:59 PM ------      Message from: Rosalio Macadamia      Created: Tue Apr 09, 2011  7:34 AM       Ok to report. TSH is abnormal. Cut her dose to 75 mcg. Send copy to Dr. Sharl Ma. I think he see her too. Recheck with him. This may be why she has not been feeling good. All other labs ok.

## 2011-04-16 NOTE — Telephone Encounter (Signed)
Agree with plan 

## 2011-04-18 ENCOUNTER — Other Ambulatory Visit: Payer: Self-pay | Admitting: *Deleted

## 2011-04-18 DIAGNOSIS — I4891 Unspecified atrial fibrillation: Secondary | ICD-10-CM

## 2011-04-18 MED ORDER — ATENOLOL 25 MG PO TABS: 25.0000 mg | ORAL_TABLET | Freq: Two times a day (BID) | ORAL | Status: DC

## 2011-04-23 ENCOUNTER — Other Ambulatory Visit: Payer: Self-pay | Admitting: *Deleted

## 2011-04-23 DIAGNOSIS — E781 Pure hyperglyceridemia: Secondary | ICD-10-CM

## 2011-04-24 MED ORDER — FENOFIBRATE 145 MG PO TABS: 145.0000 mg | ORAL_TABLET | Freq: Every day | ORAL | Status: DC

## 2011-04-24 NOTE — Telephone Encounter (Signed)
Discussed with [name] and ok since patient has been taking

## 2011-05-01 ENCOUNTER — Other Ambulatory Visit: Payer: Medicare Other | Admitting: *Deleted

## 2011-05-01 ENCOUNTER — Encounter: Payer: Medicare Other | Admitting: *Deleted

## 2011-05-03 ENCOUNTER — Ambulatory Visit (INDEPENDENT_AMBULATORY_CARE_PROVIDER_SITE_OTHER): Payer: Medicare Other | Admitting: *Deleted

## 2011-05-03 DIAGNOSIS — I4891 Unspecified atrial fibrillation: Secondary | ICD-10-CM

## 2011-05-03 DIAGNOSIS — Z7901 Long term (current) use of anticoagulants: Secondary | ICD-10-CM

## 2011-05-10 ENCOUNTER — Other Ambulatory Visit: Payer: Medicare Other | Admitting: *Deleted

## 2011-05-10 ENCOUNTER — Other Ambulatory Visit (INDEPENDENT_AMBULATORY_CARE_PROVIDER_SITE_OTHER): Payer: Medicare Other | Admitting: *Deleted

## 2011-05-10 DIAGNOSIS — E785 Hyperlipidemia, unspecified: Secondary | ICD-10-CM

## 2011-05-10 DIAGNOSIS — Z79899 Other long term (current) drug therapy: Secondary | ICD-10-CM

## 2011-05-10 LAB — HEPATIC FUNCTION PANEL
Albumin: 3.9 g/dL (ref 3.5–5.2)
Alkaline Phosphatase: 45 U/L (ref 39–117)
Bilirubin, Direct: 0.2 mg/dL (ref 0.0–0.3)
Total Protein: 6.5 g/dL (ref 6.0–8.3)

## 2011-05-10 LAB — CBC WITH DIFFERENTIAL/PLATELET
Basophils Relative: 0.4 % (ref 0.0–3.0)
HCT: 39.6 % (ref 36.0–46.0)
Hemoglobin: 13.1 g/dL (ref 12.0–15.0)
Lymphocytes Relative: 45.3 % (ref 12.0–46.0)
Lymphs Abs: 2 10*3/uL (ref 0.7–4.0)
Monocytes Relative: 13.2 % — ABNORMAL HIGH (ref 3.0–12.0)
Neutro Abs: 1.7 10*3/uL (ref 1.4–7.7)
RBC: 4.17 Mil/uL (ref 3.87–5.11)

## 2011-05-10 LAB — LIPID PANEL
Cholesterol: 114 mg/dL (ref 0–200)
LDL Cholesterol: 37 mg/dL (ref 0–99)
Total CHOL/HDL Ratio: 2
Triglycerides: 44 mg/dL (ref 0.0–149.0)
VLDL: 8.8 mg/dL (ref 0.0–40.0)

## 2011-05-13 ENCOUNTER — Other Ambulatory Visit: Payer: Self-pay | Admitting: *Deleted

## 2011-05-13 MED ORDER — NITROGLYCERIN 0.4 MG SL SUBL: 0.4000 mg | SUBLINGUAL_TABLET | SUBLINGUAL | Status: DC | PRN

## 2011-05-13 NOTE — Telephone Encounter (Signed)
Fax Received. Refill Completed. Kristen Whitehead (M.A)  

## 2011-05-15 ENCOUNTER — Encounter: Payer: Medicare Other | Admitting: *Deleted

## 2011-05-15 ENCOUNTER — Ambulatory Visit: Payer: Medicare Other | Admitting: Cardiology

## 2011-05-17 ENCOUNTER — Encounter: Payer: Medicare Other | Admitting: *Deleted

## 2011-05-17 ENCOUNTER — Ambulatory Visit: Payer: Medicare Other | Admitting: Cardiology

## 2011-05-17 ENCOUNTER — Ambulatory Visit (INDEPENDENT_AMBULATORY_CARE_PROVIDER_SITE_OTHER): Payer: Medicare Other | Admitting: Cardiology

## 2011-05-17 ENCOUNTER — Encounter: Payer: Self-pay | Admitting: Cardiology

## 2011-05-17 ENCOUNTER — Ambulatory Visit (INDEPENDENT_AMBULATORY_CARE_PROVIDER_SITE_OTHER): Payer: Medicare Other | Admitting: *Deleted

## 2011-05-17 VITALS — BP 110/70 | HR 74 | Ht 59.0 in | Wt 126.0 lb

## 2011-05-17 DIAGNOSIS — R5381 Other malaise: Secondary | ICD-10-CM

## 2011-05-17 DIAGNOSIS — R5383 Other fatigue: Secondary | ICD-10-CM

## 2011-05-17 DIAGNOSIS — I340 Nonrheumatic mitral (valve) insufficiency: Secondary | ICD-10-CM

## 2011-05-17 DIAGNOSIS — I059 Rheumatic mitral valve disease, unspecified: Secondary | ICD-10-CM

## 2011-05-17 DIAGNOSIS — I119 Hypertensive heart disease without heart failure: Secondary | ICD-10-CM

## 2011-05-17 DIAGNOSIS — E7800 Pure hypercholesterolemia, unspecified: Secondary | ICD-10-CM

## 2011-05-17 DIAGNOSIS — I4891 Unspecified atrial fibrillation: Secondary | ICD-10-CM

## 2011-05-17 LAB — POCT INR: INR: 2

## 2011-05-17 NOTE — Progress Notes (Signed)
Kristen Whitehead Date of Birth:  [birth year] [facility] / [facility] [address]   [address] [place], [place]  [address] [phone]           Fax   [phone]  History of Present Illness: This pleasant 77 year old woman is seen for a scheduled followup office visit.  She has a history of established atrial fibrillation.  She also has a history of hypercholesterolemia, essential hypertension, and hypothyroidism.  She is followed for her hypothyroidism by Dr. Sharl Ma.  Since last visit.  She has felt better since her dose of Synthroid was reduced.  She has not been expressing any chest pain or increased shortness of breath.  She is not expressing any dizziness or syncope  Current Outpatient Prescriptions  Medication Sig Dispense Refill  . ALPRAZolam (XANAX) 0.5 MG tablet Take 1 tablet (0.5 mg total) by mouth at bedtime as needed.  45 tablet  3  . amLODipine (NORVASC) 5 MG tablet Take 2.5 mg by mouth daily. ( 1/2 tablet )       . atenolol (TENORMIN) 25 MG tablet Take 1 tablet (25 mg total) by mouth 2 (two) times daily.  180 tablet  3  . calcium gluconate 500 MG tablet Take 500 mg by mouth daily.        . Cholecalciferol (VITAMIN D) 2000 UNITS tablet Take 5,000 Units by mouth daily.       . cycloSPORINE (RESTASIS) 0.05 % ophthalmic emulsion Place 1 drop into both eyes daily.        Marland Kitchen estradiol (ESTRACE VAGINAL) 0.1 MG/GM vaginal cream Place 2 g vaginally. Twice a week      . fenofibrate (TRICOR) 145 MG tablet Take 1 tablet (145 mg total) by mouth daily.  90 tablet  3  . furosemide (LASIX) 40 MG tablet 1/2 tab po bid      . isosorbide dinitrate (ISORDIL) 30 MG tablet Take 30 mg by mouth daily.       Marland Kitchen levothyroxine (SYNTHROID, LEVOTHROID) 75 MCG tablet Take 75 mcg by mouth daily.        . nitroGLYCERIN (NITROSTAT) 0.4 MG SL tablet Place 1 tablet (0.4 mg total) under the tongue every 5 (five) minutes as needed.  25 tablet  5  . simvastatin (ZOCOR) 10 MG tablet Take 10 mg by  mouth at bedtime.        . travoprost, benzalkonium, (TRAVATAN) 0.004 % ophthalmic solution Place 1 drop into both eyes at bedtime.        Marland Kitchen warfarin (COUMADIN) 5 MG tablet Take 5 mg by mouth as directed.          Allergies  Allergen Reactions  . Demerol   . Ebastine     EBS  . Ibuprofen   . Tetanus Toxoids   . Tetracyclines & Related     Patient Active Problem List  Diagnoses  . Atrial fibrillation  . Benign hypertensive heart disease without heart failure  . Mitral regurgitation  . Hypothyroidism  . Hypercholesterolemia  . Fatigue    History  Smoking status  . Never Smoker   Smokeless tobacco  . Not on file    History  Alcohol Use No    Family History  Problem Relation Age of Onset  . Heart attack Neg Hx   . Heart disease Neg Hx     Review of Systems: Constitutional: no fever chills diaphoresis or fatigue or change in weight.  Head and neck: no hearing loss, no epistaxis,  no photophobia or visual disturbance. Respiratory: No cough, shortness of breath or wheezing. Cardiovascular: No chest pain peripheral edema, palpitations. Gastrointestinal: No abdominal distention, no abdominal pain, no change in bowel habits hematochezia or melena. Genitourinary: No dysuria, no frequency, no urgency, no nocturia. Musculoskeletal:No arthralgias, no back pain, no gait disturbance or myalgias. Neurological: No dizziness, no headaches, no numbness, no seizures, no syncope, no weakness, no tremors. Hematologic: No lymphadenopathy, no easy bruising. Psychiatric: No confusion, no hallucinations, no sleep disturbance.    Physical Exam: Filed Vitals:   05/17/11 0920  BP: 110/70  Pulse: 74   the general appearance reveals an elderly woman in no acute distress.Pupils equal and reactive.   Extraocular Movements are full.  There is no scleral icterus.  The mouth and pharynx are normal.  The neck is supple.  The carotids reveal no bruits.  The jugular venous pressure is normal.   The thyroid is not enlarged.  There is no lymphadenopathy.  The chest is clear to percussion and auscultation. There are no rales or rhonchi. Expansion of the chest is symmetrical.  The precordium is quiet.  The first heart sound is normal.  The second heart sound is physiologically split.  There is no  gallop rub or click.  There is no abnormal lift or heave.  There is a soft apical murmur of mitral regurgitation.  The rhythm is irregular in nature fibrillation The abdomen is soft and nontender. Bowel sounds are normal. The liver and spleen are not enlarged. There Are no abdominal masses. There are no bruits.  The pedal pulses are good.  There is no phlebitis or edema.  There is no cyanosis or clubbing. Strength is normal and symmetrical in all extremities.  There is no lateralizing weakness.  There are no sensory deficits.  The skin is warm and dry.  There is no rash.      Assessment / Plan:

## 2011-05-17 NOTE — Assessment & Plan Note (Signed)
We reviewed today.  Her recent labs drawn one week ago and they are excellent

## 2011-05-17 NOTE — Assessment & Plan Note (Signed)
The patient feels better and her fatigue is improved.

## 2011-05-17 NOTE — Assessment & Plan Note (Signed)
The patient is on long-term Coumadin for her atrial fibrillation.  Her INR today is therapeutic at 2.0 and she will stay on same Coumadin.  She has not had any thromboembolic events or TIA events.

## 2011-05-17 NOTE — Patient Instructions (Signed)
Stay on your same medicines Stay active.   

## 2011-05-17 NOTE — Assessment & Plan Note (Signed)
No symptoms of congestive heart failure from her mitral regurgitation

## 2011-06-17 ENCOUNTER — Encounter: Payer: Medicare Other | Admitting: *Deleted

## 2011-07-05 ENCOUNTER — Ambulatory Visit (INDEPENDENT_AMBULATORY_CARE_PROVIDER_SITE_OTHER): Payer: Medicare Other | Admitting: *Deleted

## 2011-07-05 DIAGNOSIS — Z7901 Long term (current) use of anticoagulants: Secondary | ICD-10-CM

## 2011-07-05 DIAGNOSIS — I4891 Unspecified atrial fibrillation: Secondary | ICD-10-CM

## 2011-07-05 LAB — POCT INR: INR: 2.5

## 2011-07-24 ENCOUNTER — Other Ambulatory Visit: Payer: Self-pay | Admitting: Pharmacist

## 2011-07-24 MED ORDER — WARFARIN SODIUM 5 MG PO TABS: 5.0000 mg | ORAL_TABLET | ORAL | Status: DC

## 2011-08-16 ENCOUNTER — Ambulatory Visit (INDEPENDENT_AMBULATORY_CARE_PROVIDER_SITE_OTHER): Payer: Medicare Other | Admitting: *Deleted

## 2011-08-16 DIAGNOSIS — Z7901 Long term (current) use of anticoagulants: Secondary | ICD-10-CM

## 2011-08-16 DIAGNOSIS — I4891 Unspecified atrial fibrillation: Secondary | ICD-10-CM

## 2011-08-16 LAB — POCT INR: INR: 1.9

## 2011-09-09 ENCOUNTER — Telehealth: Payer: Self-pay | Admitting: Cardiology

## 2011-09-09 NOTE — Telephone Encounter (Signed)
New Problem:    Patient called in wanting you to call her back because her blood pressure is real high and she has bronchitis. Please call back.

## 2011-09-09 NOTE — Telephone Encounter (Signed)
Coughing a lot and feels bad,  blood pressure running 150's/80's-91.  Not taking an antibiotic but is taking a Benzonatate 100 mg three times a day. Did call her PCP this am and has not heard back, advised to call again.  She is just concerned about her blood pressure

## 2011-09-09 NOTE — Telephone Encounter (Signed)
Advised patient of increase and to continue to monitor.  Keep appointment 1/28.  PCP is calling in antibiotic for her.  Advised needs to keep INR check this week, not to worry about fasting labs if she doesn't feel up to it

## 2011-09-09 NOTE — Telephone Encounter (Signed)
Could go up to 5 mg of norvasc and continue to monitor her blood pressure.

## 2011-09-11 ENCOUNTER — Other Ambulatory Visit: Payer: Medicare Other | Admitting: *Deleted

## 2011-09-11 ENCOUNTER — Encounter: Payer: Medicare Other | Admitting: *Deleted

## 2011-09-13 ENCOUNTER — Encounter: Payer: Medicare Other | Admitting: *Deleted

## 2011-09-16 ENCOUNTER — Encounter: Payer: Self-pay | Admitting: Cardiology

## 2011-09-16 ENCOUNTER — Ambulatory Visit (INDEPENDENT_AMBULATORY_CARE_PROVIDER_SITE_OTHER): Payer: Medicare Other | Admitting: Pharmacist

## 2011-09-16 ENCOUNTER — Ambulatory Visit (INDEPENDENT_AMBULATORY_CARE_PROVIDER_SITE_OTHER): Payer: Medicare Other | Admitting: Cardiology

## 2011-09-16 VITALS — BP 100/70 | HR 80 | Ht 59.0 in | Wt 124.0 lb

## 2011-09-16 DIAGNOSIS — R05 Cough: Secondary | ICD-10-CM

## 2011-09-16 DIAGNOSIS — I119 Hypertensive heart disease without heart failure: Secondary | ICD-10-CM

## 2011-09-16 DIAGNOSIS — R059 Cough, unspecified: Secondary | ICD-10-CM

## 2011-09-16 DIAGNOSIS — I340 Nonrheumatic mitral (valve) insufficiency: Secondary | ICD-10-CM

## 2011-09-16 DIAGNOSIS — I4891 Unspecified atrial fibrillation: Secondary | ICD-10-CM

## 2011-09-16 DIAGNOSIS — E7800 Pure hypercholesterolemia, unspecified: Secondary | ICD-10-CM

## 2011-09-16 DIAGNOSIS — Z7901 Long term (current) use of anticoagulants: Secondary | ICD-10-CM

## 2011-09-16 DIAGNOSIS — I059 Rheumatic mitral valve disease, unspecified: Secondary | ICD-10-CM

## 2011-09-16 NOTE — Assessment & Plan Note (Signed)
Patient has a past history of high blood pressure.  She notes that for the past several weeks her pressure has been running borderline high.  She attributes this to the stress of having had a severe respiratory illness.

## 2011-09-16 NOTE — Assessment & Plan Note (Signed)
The patient has a history of cholesterolemia and is on simvastatin and TriCor.  She has not been having any myalgias or side effects from the statin therapy.

## 2011-09-16 NOTE — Assessment & Plan Note (Signed)
The patient has had a bad cough for several weeks.  A week ago she went to the inguinal walk-in clinic and had a chest x-ray which was negative for pneumonia.  She was treated for bronchitis with antibiotics.  She thinks it was amoxicillin.  It may have been Augmentin.  He was twice a day.  She went back yesterday because of persistent cough and was given a nebulizer treatment which helped.  She had been running fever but no longer is running fever.  She is coughing up small amounts of sputum.  He has been taking Tylenol for myalgias.  She did take the flu shot.  Her sputum is still thick and she will add Mucinex 600 mg twice a day.

## 2011-09-16 NOTE — Assessment & Plan Note (Signed)
The patient has not been having any symptoms from her mitral regurgitation.  She is not having any symptoms of congestive heart failure.  She has been able to decrease her furosemide to just 20 mg once a day.

## 2011-09-16 NOTE — Patient Instructions (Signed)
Will have you come back on 1/30 after 8:30 for fasting labs Your physician recommends that you continue on your current medications as directed. Please refer to the Current Medication list given to you today. Add Mucinex plain OTC twice daily as needed Your physician recommends that you schedule a follow-up appointment in: 4 months ov/ekg/bmet

## 2011-09-16 NOTE — Assessment & Plan Note (Signed)
The patient has a history of chronic established atrial fibrillation.  She is on long-term Coumadin.  Normally she gets her prothrombin times checked out at [facility] because it is closer to where she lives.  He had an INR yesterday which was 1.9.  She has been on recent antibiotics.

## 2011-09-16 NOTE — Progress Notes (Signed)
[name] Date of Birth:  05-17-1923 [facility] [address] [place], [place]  [address] [phone]         Fax   [phone]  History of Present Illness: This pleasant 78 year old woman is seen for a scheduled four-month followup office visit.  She has a history of established atrial fibrillation.  She has a past history of suspected mitral valve prolapse.  7 hypercholesterolemia essential hypertension and hypothyroidism as well as diastolic congestive heart failure.  Her last echocardiogram was 09/17/07 showed normal LV systolic function with an ejection fraction of 55-60% and mild aortic sclerosis, trivial aortic insufficiency, and mild to moderate mitral regurgitation with normal pulmonary artery pressure.  As the previous echocardiogram of 09/06/02, the mitral regurgitation had increased.  The patient does not have any history of ischemic heart disease and she had a normal myocardial perfusion imaging study on 09/06/09 and her ejection fraction on that study was 80% with no evidence of ischemia.  Current Outpatient Prescriptions  Medication Sig Dispense Refill  . amLODipine (NORVASC) 5 MG tablet Take 2.5 mg by mouth.       Marland Kitchen atenolol (TENORMIN) 25 MG tablet Take 1 tablet (25 mg total) by mouth 2 (two) times daily.  180 tablet  3  . calcium gluconate 500 MG tablet Take 500 mg by mouth daily.        . Cholecalciferol (VITAMIN D) 2000 UNITS tablet Take 5,000 Units by mouth daily.       . cycloSPORINE (RESTASIS) 0.05 % ophthalmic emulsion Place 1 drop into both eyes daily.        Marland Kitchen estradiol (ESTRACE VAGINAL) 0.1 MG/GM vaginal cream Place 2 g vaginally. Twice a week      . fenofibrate (TRICOR) 145 MG tablet Take 1 tablet (145 mg total) by mouth daily.  90 tablet  3  . furosemide (LASIX) 40 MG tablet 1/2 tab podaily      . isosorbide dinitrate (ISORDIL) 30 MG tablet Take 30 mg by mouth daily.       Marland Kitchen levothyroxine (SYNTHROID, LEVOTHROID) 75 MCG tablet Take 75  mcg by mouth daily.        . nitroGLYCERIN (NITROSTAT) 0.4 MG SL tablet Place 1 tablet (0.4 mg total) under the tongue every 5 (five) minutes as needed.  25 tablet  5  . simvastatin (ZOCOR) 10 MG tablet Take 10 mg by mouth at bedtime.        . temazepam (RESTORIL) 30 MG capsule Take 30 mg by mouth at bedtime as needed. As needed for sleep       . travoprost, benzalkonium, (TRAVATAN) 0.004 % ophthalmic solution Place 1 drop into both eyes at bedtime.        Marland Kitchen warfarin (COUMADIN) 5 MG tablet Take 1 tablet (5 mg total) by mouth as directed.  30 tablet  3    Allergies  Allergen Reactions  . Demerol   . Ebastine     EBS  . Ibuprofen   . Tetanus Toxoids   . Tetracyclines & Related     Patient Active Problem List  Diagnoses  . Atrial fibrillation  . Benign hypertensive heart disease without heart failure  . Mitral regurgitation  . Hypothyroidism  . Hypercholesterolemia  . Fatigue    History  Smoking status  . Never Smoker   Smokeless tobacco  . Not on file    History  Alcohol Use No    Family History  Problem Relation Age of Onset  .  Heart attack Neg Hx   . Heart disease Neg Hx     Review of Systems: Constitutional: no fever chills diaphoresis or fatigue or change in weight.  Head and neck: no hearing loss, no epistaxis, no photophobia or visual disturbance. Respiratory: No cough, shortness of breath or wheezing. Cardiovascular: No chest pain peripheral edema, palpitations. Gastrointestinal: No abdominal distention, no abdominal pain, no change in bowel habits hematochezia or melena. Genitourinary: No dysuria, no frequency, no urgency, no nocturia. Musculoskeletal:No arthralgias, no back pain, no gait disturbance or myalgias. Neurological: No dizziness, no headaches, no numbness, no seizures, no syncope, no weakness, no tremors. Hematologic: No lymphadenopathy, no easy bruising. Psychiatric: No confusion, no hallucinations, no sleep disturbance.    Physical  Exam: Filed Vitals:   09/16/11 1509  BP: 100/70  Pulse: 80   the general appearance reveals a elderly woman in no acute distress.Pupils equal and reactive.   Extraocular Movements are full.  There is no scleral icterus.  The mouth and pharynx are normal.  The neck is supple.  The carotids reveal no bruits.  The jugular venous pressure is normal.  The thyroid is not enlarged.  There is no lymphadenopathy.  Chest reveals coarse rhonchi throughout all lung fields.  The heart reveals a faint murmur of mitral regurgitation at apex.  Rhythm is irregular.  No gallop or rub.The abdomen is soft and nontender. Bowel sounds are normal. The liver and spleen are not enlarged. There Are no abdominal masses. There are no bruits.  The pedal pulses are good.  There is no phlebitis or edema.  There is no cyanosis or clubbing. Strength is normal and symmetrical in all extremities.  There is no lateralizing weakness.  There are no sensory deficits.  The skin is warm and dry.  There is no rash.      Assessment / Plan: She ate lunch today so we will have her return in several days for fasting lab work including CBC lipid panel hepatic function panel and basal metabolic panel.  She will return in 4 months for followup office visit EKG and basal metabolic panel

## 2011-09-18 ENCOUNTER — Other Ambulatory Visit (INDEPENDENT_AMBULATORY_CARE_PROVIDER_SITE_OTHER): Payer: Medicare Other | Admitting: *Deleted

## 2011-09-18 DIAGNOSIS — E7800 Pure hypercholesterolemia, unspecified: Secondary | ICD-10-CM

## 2011-09-18 DIAGNOSIS — I4891 Unspecified atrial fibrillation: Secondary | ICD-10-CM

## 2011-09-18 LAB — CBC WITH DIFFERENTIAL/PLATELET
Eosinophils Absolute: 0.2 10*3/uL (ref 0.0–0.7)
Eosinophils Relative: 2.2 % (ref 0.0–5.0)
Hemoglobin: 13.4 g/dL (ref 12.0–15.0)
Lymphocytes Relative: 38 % (ref 12.0–46.0)
Lymphs Abs: 2.8 10*3/uL (ref 0.7–4.0)
Monocytes Absolute: 0.8 10*3/uL (ref 0.1–1.0)
Monocytes Relative: 11.3 % (ref 3.0–12.0)
Neutro Abs: 3.5 10*3/uL (ref 1.4–7.7)
Platelets: 369 10*3/uL (ref 150.0–400.0)
RDW: 14.3 % (ref 11.5–14.6)
WBC: 7.3 10*3/uL (ref 4.5–10.5)

## 2011-09-18 LAB — BASIC METABOLIC PANEL
BUN: 14 mg/dL (ref 6–23)
Calcium: 8.7 mg/dL (ref 8.4–10.5)
GFR: 73.2 mL/min (ref >60.00)
Glucose, Bld: 98 mg/dL (ref 70–99)
Sodium: 139 mEq/L (ref 135–145)

## 2011-09-18 LAB — HEPATIC FUNCTION PANEL
ALT: 15 U/L (ref 0–35)
Bilirubin, Direct: 0.2 mg/dL (ref 0.0–0.3)
Total Protein: 7 g/dL (ref 6.0–8.3)

## 2011-09-18 LAB — LIPID PANEL
Total CHOL/HDL Ratio: 2
Triglycerides: 46 mg/dL (ref 0.0–149.0)

## 2011-09-19 ENCOUNTER — Telehealth: Payer: Self-pay | Admitting: *Deleted

## 2011-09-19 DIAGNOSIS — E876 Hypokalemia: Secondary | ICD-10-CM

## 2011-09-19 MED ORDER — POTASSIUM CHLORIDE CRYS ER 20 MEQ PO TBCR: 20.0000 meq | EXTENDED_RELEASE_TABLET | Freq: Every day | ORAL | Status: DC

## 2011-09-19 NOTE — Telephone Encounter (Signed)
Message copied by Burnell Blanks on Thu Sep 19, 2011 12:42 PM ------      Message from: Cassell Clement      Created: Thu Sep 19, 2011 12:08 PM       Lab okay except K is low 3.3.  Add Kdur 20 meq one daily.

## 2011-09-19 NOTE — Telephone Encounter (Signed)
Advised of labs and scheduled recheck at next INR visit

## 2011-10-10 ENCOUNTER — Other Ambulatory Visit (INDEPENDENT_AMBULATORY_CARE_PROVIDER_SITE_OTHER): Payer: Medicare Other

## 2011-10-10 ENCOUNTER — Ambulatory Visit (INDEPENDENT_AMBULATORY_CARE_PROVIDER_SITE_OTHER): Payer: Medicare Other | Admitting: *Deleted

## 2011-10-10 DIAGNOSIS — E876 Hypokalemia: Secondary | ICD-10-CM

## 2011-10-10 DIAGNOSIS — Z7901 Long term (current) use of anticoagulants: Secondary | ICD-10-CM

## 2011-10-10 DIAGNOSIS — I4891 Unspecified atrial fibrillation: Secondary | ICD-10-CM

## 2011-10-11 ENCOUNTER — Telehealth: Payer: Self-pay | Admitting: *Deleted

## 2011-10-11 LAB — BASIC METABOLIC PANEL
BUN: 16 mg/dL (ref 6–23)
Chloride: 106 mEq/L (ref 96–112)
Potassium: 3.9 mEq/L (ref 3.5–5.1)

## 2011-10-11 NOTE — Telephone Encounter (Signed)
Advised of labs 

## 2011-10-11 NOTE — Telephone Encounter (Signed)
Message copied by Burnell Blanks on Fri Oct 11, 2011  1:38 PM ------      Message from: Cassell Clement      Created: Fri Oct 11, 2011 12:18 PM       The potassium is back to normal now.  Continue same medication.

## 2011-10-24 ENCOUNTER — Ambulatory Visit (INDEPENDENT_AMBULATORY_CARE_PROVIDER_SITE_OTHER): Payer: Medicare Other | Admitting: Pharmacist

## 2011-10-24 DIAGNOSIS — Z7901 Long term (current) use of anticoagulants: Secondary | ICD-10-CM

## 2011-10-24 DIAGNOSIS — I4891 Unspecified atrial fibrillation: Secondary | ICD-10-CM

## 2011-10-24 LAB — POCT INR: INR: 3.2

## 2011-11-07 ENCOUNTER — Encounter (INDEPENDENT_AMBULATORY_CARE_PROVIDER_SITE_OTHER): Payer: Medicare Other | Admitting: *Deleted

## 2011-11-07 ENCOUNTER — Other Ambulatory Visit: Payer: Self-pay | Admitting: Cardiology

## 2011-11-07 DIAGNOSIS — Z7901 Long term (current) use of anticoagulants: Secondary | ICD-10-CM

## 2011-11-07 DIAGNOSIS — I4891 Unspecified atrial fibrillation: Secondary | ICD-10-CM

## 2011-11-07 LAB — POCT INR: INR: 4.1

## 2011-11-07 NOTE — Telephone Encounter (Signed)
Refilled simvastatin.

## 2011-11-21 ENCOUNTER — Ambulatory Visit (INDEPENDENT_AMBULATORY_CARE_PROVIDER_SITE_OTHER): Payer: Medicare Other | Admitting: Pharmacist

## 2011-11-21 DIAGNOSIS — Z7901 Long term (current) use of anticoagulants: Secondary | ICD-10-CM

## 2011-11-21 DIAGNOSIS — I4891 Unspecified atrial fibrillation: Secondary | ICD-10-CM

## 2011-11-21 LAB — POCT INR: INR: 1.5

## 2011-12-05 ENCOUNTER — Ambulatory Visit (INDEPENDENT_AMBULATORY_CARE_PROVIDER_SITE_OTHER): Payer: Medicare Other

## 2011-12-05 DIAGNOSIS — Z7901 Long term (current) use of anticoagulants: Secondary | ICD-10-CM

## 2011-12-05 DIAGNOSIS — I4891 Unspecified atrial fibrillation: Secondary | ICD-10-CM

## 2011-12-06 ENCOUNTER — Other Ambulatory Visit: Payer: Self-pay | Admitting: Cardiology

## 2011-12-06 NOTE — Telephone Encounter (Signed)
Refilled amlodipine 

## 2011-12-27 ENCOUNTER — Ambulatory Visit (INDEPENDENT_AMBULATORY_CARE_PROVIDER_SITE_OTHER): Payer: Medicare Other | Admitting: Pharmacist

## 2011-12-27 DIAGNOSIS — I4891 Unspecified atrial fibrillation: Secondary | ICD-10-CM

## 2011-12-27 DIAGNOSIS — Z7901 Long term (current) use of anticoagulants: Secondary | ICD-10-CM

## 2012-01-15 ENCOUNTER — Ambulatory Visit (INDEPENDENT_AMBULATORY_CARE_PROVIDER_SITE_OTHER): Payer: Medicare Other | Admitting: Pharmacist

## 2012-01-15 ENCOUNTER — Encounter: Payer: Self-pay | Admitting: Cardiology

## 2012-01-15 ENCOUNTER — Ambulatory Visit (INDEPENDENT_AMBULATORY_CARE_PROVIDER_SITE_OTHER): Payer: Medicare Other | Admitting: Cardiology

## 2012-01-15 VITALS — BP 130/76 | HR 76 | Ht 59.0 in | Wt 126.8 lb

## 2012-01-15 DIAGNOSIS — I340 Nonrheumatic mitral (valve) insufficiency: Secondary | ICD-10-CM

## 2012-01-15 DIAGNOSIS — E7800 Pure hypercholesterolemia, unspecified: Secondary | ICD-10-CM

## 2012-01-15 DIAGNOSIS — I4891 Unspecified atrial fibrillation: Secondary | ICD-10-CM

## 2012-01-15 DIAGNOSIS — Z7901 Long term (current) use of anticoagulants: Secondary | ICD-10-CM

## 2012-01-15 DIAGNOSIS — I119 Hypertensive heart disease without heart failure: Secondary | ICD-10-CM

## 2012-01-15 DIAGNOSIS — I059 Rheumatic mitral valve disease, unspecified: Secondary | ICD-10-CM

## 2012-01-15 DIAGNOSIS — M2550 Pain in unspecified joint: Secondary | ICD-10-CM

## 2012-01-15 NOTE — Patient Instructions (Signed)
Hold your Tricor and Simvastatin for 3 weeks and call us with update on your legs  Your physician recommends that you schedule a follow-up appointment in: 3 months with fasting labs (LP/BMET/HFP)

## 2012-01-15 NOTE — Progress Notes (Signed)
Kristen Whitehead Date of Birth:  March 22, 1923 [facility] [address] [place], [place]  [address] [phone]         Fax   [phone]  History of Present Illness: This pleasant 78 year old woman is seen for a scheduled followup office visit.  He has a past history of established atrial fibrillation.  She has a history of hypercholesterolemia, essential hypertension, diastolic congestive heart failure, hypothyroidism, and suspected mitral valve prolapse.  Last echocardiogram in January 2009 showed mild to moderate mitral regurgitation with normal pulmonary artery pressure.  The patient does not have any history of ischemic heart disease and had a normal bicarbonate perfusion imaging study on 09/06/09.  Her ejection fraction was 80%.  Current Outpatient Prescriptions  Medication Sig Dispense Refill  . amLODipine (NORVASC) 5 MG tablet TAKE 1/2 TABLET DAILY.  90 tablet  PRN  . atenolol (TENORMIN) 25 MG tablet Take 1 tablet (25 mg total) by mouth 2 (two) times daily.  180 tablet  3  . calcium gluconate 500 MG tablet Take 500 mg by mouth daily.        . Cholecalciferol (VITAMIN D) 2000 UNITS tablet Take 5,000 Units by mouth daily.       . cycloSPORINE (RESTASIS) 0.05 % ophthalmic emulsion Place 1 drop into both eyes daily.        Marland Kitchen estradiol (ESTRACE VAGINAL) 0.1 MG/GM vaginal cream Place 2 g vaginally. Twice a week      . fenofibrate (TRICOR) 145 MG tablet Take 1 tablet (145 mg total) by mouth daily.  90 tablet  3  . furosemide (LASIX) 40 MG tablet 1/2 tab podaily      . isosorbide dinitrate (ISORDIL) 30 MG tablet Take 30 mg by mouth daily.       Marland Kitchen levothyroxine (SYNTHROID, LEVOTHROID) 75 MCG tablet Take 75 mcg by mouth daily.        . nitroGLYCERIN (NITROSTAT) 0.4 MG SL tablet Place 1 tablet (0.4 mg total) under the tongue every 5 (five) minutes as needed.  25 tablet  5  . potassium chloride SA (K-DUR,KLOR-CON) 20 MEQ tablet Take 1 tablet (20 mEq total) by mouth  daily.  30 tablet  11  . simvastatin (ZOCOR) 10 MG tablet take 1 tablet by mouth once daily  90 tablet  PRN  . temazepam (RESTORIL) 30 MG capsule Take 30 mg by mouth at bedtime as needed. As needed for sleep       . travoprost, benzalkonium, (TRAVATAN) 0.004 % ophthalmic solution Place 1 drop into both eyes at bedtime.        Marland Kitchen warfarin (COUMADIN) 5 MG tablet Take 1 tablet (5 mg total) by mouth as directed.  30 tablet  3  . temazepam (RESTORIL) 15 MG capsule Take 1 capsule (15 mg total) by mouth at bedtime as needed for sleep.  30 capsule  3    Allergies  Allergen Reactions  . Demerol   . Ebastine     EBS  . Ibuprofen   . Tetanus Toxoids   . Tetracyclines & Related     Patient Active Problem List  Diagnoses  . Atrial fibrillation  . Benign hypertensive heart disease without heart failure  . Mitral regurgitation  . Hypothyroidism  . Hypercholesterolemia  . Fatigue  . Cough    History  Smoking status  . Never Smoker   Smokeless tobacco  . Not on file    History  Alcohol Use No    Family History  Problem  Relation Age of Onset  . Heart attack Neg Hx   . Heart disease Neg Hx     Review of Systems: Constitutional: no fever chills diaphoresis or fatigue or change in weight.  Head and neck: no hearing loss, no epistaxis, no photophobia or visual disturbance. Respiratory: No cough, shortness of breath or wheezing. Cardiovascular: No chest pain peripheral edema, palpitations. Gastrointestinal: No abdominal distention, no abdominal pain, no change in bowel habits hematochezia or melena. Genitourinary: No dysuria, no frequency, no urgency, no nocturia. Musculoskeletal:No arthralgias, no back pain, no gait disturbance or myalgias. Neurological: No dizziness, no headaches, no numbness, no seizures, no syncope, no weakness, no tremors. Hematologic: No lymphadenopathy, no easy bruising. Psychiatric: No confusion, no hallucinations, no sleep disturbance.    Physical  Exam: Filed Vitals:   01/15/12 1356  BP: 130/76  Pulse: 76   the general appearance reveals a well-developed elderly woman in no distress.Pupils equal and reactive.   Extraocular Movements are full.  There is no scleral icterus.  The mouth and pharynx are normal.  The neck is supple.  The carotids reveal no bruits.  The jugular venous pressure is normal.  The thyroid is not enlarged.  There is no lymphadenopathy.  The chest is clear to percussion and auscultation. There are no rales or rhonchi. Expansion of the chest is symmetrical.  The heart reveals a soft apical murmur of mitral regurgitation.  The rhythm is irregular The abdomen is soft and nontender. Bowel sounds are normal. The liver and spleen are not enlarged. There Are no abdominal masses. There are no bruits.  The pedal pulses are good.  There is no phlebitis or edema.  There is no cyanosis or clubbing. Strength is normal and symmetrical in all extremities.  There is no lateralizing weakness.  There are no sensory deficits.     Assessment / Plan: Continue same medication except hold her TriCor and her simvastatin for the next treat weeks to see if this will help her leg discomfort and cramps symptoms.  Recheck in 3 months for followup office visit and at that point we will plan to get fasting lab work

## 2012-01-15 NOTE — Assessment & Plan Note (Signed)
The patient has been having a lot of myalgias and which she describes as spasms in her legs and leg cramps.  On her she started to take some magnesium for the leg cramps.  She has been on both generic TriCor and simvastatin.  These could be causing her leg symptoms also.  We will have her hold both of these drugs for the next 3 weeks and she will report back as to whether she is improved or not.

## 2012-01-15 NOTE — Assessment & Plan Note (Signed)
The patient denies any orthopnea or paroxysmal nocturnal dyspnea.  She has not been experiencing any excessive peripheral edema

## 2012-01-15 NOTE — Assessment & Plan Note (Signed)
Her blood pressure has been stable on current medication.  She has not been having any symptoms of exacerbation of her congestive heart failure.

## 2012-01-15 NOTE — Assessment & Plan Note (Signed)
The patient is in chronic atrial fibrillation on Coumadin.  She has been discouraged that her prothrombin times have been quite labile over the past several months.  This may be due in part to the fact that she has had a lot of bronchitis and has been on several rounds of antibiotics.  The patient has not had any TIA symptoms from her atrial fib

## 2012-01-17 ENCOUNTER — Other Ambulatory Visit: Payer: Self-pay | Admitting: Cardiology

## 2012-02-05 ENCOUNTER — Ambulatory Visit (INDEPENDENT_AMBULATORY_CARE_PROVIDER_SITE_OTHER): Payer: Medicare Other | Admitting: Pharmacist

## 2012-02-05 DIAGNOSIS — I4891 Unspecified atrial fibrillation: Secondary | ICD-10-CM

## 2012-02-05 DIAGNOSIS — Z7901 Long term (current) use of anticoagulants: Secondary | ICD-10-CM

## 2012-02-05 LAB — POCT INR: INR: 1.1

## 2012-02-10 ENCOUNTER — Telehealth: Payer: Self-pay | Admitting: Cardiology

## 2012-02-10 NOTE — Telephone Encounter (Signed)
Has been off the Tricor and Simvastatin for 3 weeks now and leg symptoms are much better.  Would like to try Red Yeast Rice and pharmacist said she check cholesterol prior to starting.  Will forward to  Dr. Patty Sermons for review.  Patient aware will be Wednesday before call back and she is ok with that

## 2012-02-10 NOTE — Telephone Encounter (Signed)
Yes get a baseline Lipid panel then okay to start red yeast rice.

## 2012-02-10 NOTE — Telephone Encounter (Signed)
New msg Pt wants to discuss being off med for about three months. Please call

## 2012-02-12 ENCOUNTER — Other Ambulatory Visit: Payer: Self-pay | Admitting: *Deleted

## 2012-02-12 ENCOUNTER — Ambulatory Visit (INDEPENDENT_AMBULATORY_CARE_PROVIDER_SITE_OTHER): Payer: Medicare Other | Admitting: *Deleted

## 2012-02-12 DIAGNOSIS — Z7901 Long term (current) use of anticoagulants: Secondary | ICD-10-CM

## 2012-02-12 DIAGNOSIS — I4891 Unspecified atrial fibrillation: Secondary | ICD-10-CM

## 2012-02-12 DIAGNOSIS — E7800 Pure hypercholesterolemia, unspecified: Secondary | ICD-10-CM

## 2012-02-12 NOTE — Telephone Encounter (Signed)
Advised patient and she will get labs done in 2weeks at INR recheck

## 2012-02-12 NOTE — Telephone Encounter (Signed)
Left message

## 2012-02-14 ENCOUNTER — Telehealth: Payer: Self-pay | Admitting: Cardiology

## 2012-02-14 NOTE — Telephone Encounter (Signed)
New msg Pt wants to talk to you about upcoming blood work appt.

## 2012-02-14 NOTE — Telephone Encounter (Signed)
Will be getting fasting labs at PCP on 7/11 and will just have them send to [place]

## 2012-02-26 ENCOUNTER — Ambulatory Visit (INDEPENDENT_AMBULATORY_CARE_PROVIDER_SITE_OTHER): Payer: Medicare Other | Admitting: Pharmacist

## 2012-02-26 ENCOUNTER — Other Ambulatory Visit: Payer: Medicare Other

## 2012-02-26 DIAGNOSIS — I4891 Unspecified atrial fibrillation: Secondary | ICD-10-CM

## 2012-02-26 DIAGNOSIS — Z7901 Long term (current) use of anticoagulants: Secondary | ICD-10-CM

## 2012-02-26 LAB — POCT INR: INR: 1.1

## 2012-02-27 ENCOUNTER — Encounter: Payer: Self-pay | Admitting: Cardiology

## 2012-03-04 ENCOUNTER — Ambulatory Visit (INDEPENDENT_AMBULATORY_CARE_PROVIDER_SITE_OTHER): Payer: Medicare Other | Admitting: *Deleted

## 2012-03-04 DIAGNOSIS — Z7901 Long term (current) use of anticoagulants: Secondary | ICD-10-CM

## 2012-03-04 DIAGNOSIS — I4891 Unspecified atrial fibrillation: Secondary | ICD-10-CM

## 2012-03-04 LAB — POCT INR: INR: 1.4

## 2012-03-06 ENCOUNTER — Other Ambulatory Visit: Payer: Self-pay | Admitting: *Deleted

## 2012-03-06 MED ORDER — FUROSEMIDE 40 MG PO TABS: ORAL_TABLET | ORAL | Status: DC

## 2012-03-06 NOTE — Telephone Encounter (Signed)
Refilled lasix

## 2012-03-11 ENCOUNTER — Telehealth: Payer: Self-pay | Admitting: *Deleted

## 2012-03-11 ENCOUNTER — Ambulatory Visit (INDEPENDENT_AMBULATORY_CARE_PROVIDER_SITE_OTHER): Payer: Medicare Other | Admitting: *Deleted

## 2012-03-11 DIAGNOSIS — I4891 Unspecified atrial fibrillation: Secondary | ICD-10-CM

## 2012-03-11 DIAGNOSIS — Z7901 Long term (current) use of anticoagulants: Secondary | ICD-10-CM

## 2012-03-11 LAB — POCT INR: INR: 1.3

## 2012-03-11 NOTE — Telephone Encounter (Signed)
Patient came in for coumadin check today and was complaining of heaviness in chest for last couple of months.  Last night had some left arm pain and hand pain.  Patients states she has not been using her NTG, instructed to use as needed. Discussed with  Dr. Patty Sermons and will see patient tomorrow.  Advised patient of appointment

## 2012-03-12 ENCOUNTER — Ambulatory Visit (INDEPENDENT_AMBULATORY_CARE_PROVIDER_SITE_OTHER): Payer: Medicare Other | Admitting: Cardiology

## 2012-03-12 ENCOUNTER — Encounter: Payer: Self-pay | Admitting: Cardiology

## 2012-03-12 VITALS — BP 118/80 | HR 66 | Ht 59.0 in | Wt 132.0 lb

## 2012-03-12 DIAGNOSIS — R079 Chest pain, unspecified: Secondary | ICD-10-CM | POA: Insufficient documentation

## 2012-03-12 DIAGNOSIS — I4891 Unspecified atrial fibrillation: Secondary | ICD-10-CM

## 2012-03-12 NOTE — Assessment & Plan Note (Signed)
The patient is in chronic atrial fibrillation.  She has not had any TIA symptoms.

## 2012-03-12 NOTE — Patient Instructions (Signed)
Your physician has requested that you have a lexiscan myoview. For further information please visit [email or web address] Please follow instruction sheet, as given.  Your physician recommends that you continue on your current medications as directed. Please refer to the Current Medication list given to you today.  Keep your August appointment

## 2012-03-12 NOTE — Progress Notes (Signed)
Kristen Whitehead Date of Birth:  August 05, 1923 [facility] [address] [place], [place]  [address] [phone]         Fax   [phone]  History of Present Illness: This 78 year old woman is seen as a work in because of chest pain.  Tuesday night she stated that she felt weak all day.  She went to bed.  During the night she developed left shoulder discomfort which radiated across her chest.  She also noted some numbness and tingling of her left hand.  She used the words that it felt like an elephant standing on her chest.  She did not try any sublingual nitroglycerin.  Her last Myoview stress test was in January 2011.  She has a past history of established atrial fibrillation.  She states that her common times have been more erratic recently.  The patient had recent complete lab work on 02/24/12 from Dr. Catha Gosselin including lipid panel which showed total cholesterol of 188 with LDL of 87  Current Outpatient Prescriptions  Medication Sig Dispense Refill  . amLODipine (NORVASC) 5 MG tablet TAKE 1/2 TABLET DAILY.  90 tablet  PRN  . atenolol (TENORMIN) 25 MG tablet Take 1 tablet (25 mg total) by mouth 2 (two) times daily.  180 tablet  3  . calcium gluconate 500 MG tablet Take 500 mg by mouth daily.        . Cholecalciferol (VITAMIN D) 2000 UNITS tablet Take 5,000 Units by mouth daily.       Marland Kitchen estradiol (ESTRACE VAGINAL) 0.1 MG/GM vaginal cream Place 2 g vaginally. Twice a week      . furosemide (LASIX) 40 MG tablet 1/2 tab podaily  30 tablet  11  . isosorbide dinitrate (ISORDIL) 30 MG tablet Take 30 mg by mouth daily.       Marland Kitchen levothyroxine (SYNTHROID, LEVOTHROID) 75 MCG tablet Take 75 mcg by mouth daily.        . nitroGLYCERIN (NITROSTAT) 0.4 MG SL tablet Place 1 tablet (0.4 mg total) under the tongue every 5 (five) minutes as needed.  25 tablet  5  . potassium chloride SA (K-DUR,KLOR-CON) 20 MEQ tablet Take 1 tablet (20 mEq total) by mouth daily.  30 tablet  11  .  temazepam (RESTORIL) 30 MG capsule Take 30 mg by mouth at bedtime as needed. As needed for sleep       . travoprost, benzalkonium, (TRAVATAN) 0.004 % ophthalmic solution Place 1 drop into both eyes at bedtime.        Marland Kitchen warfarin (COUMADIN) 5 MG tablet Take as directed by [facility]  30 tablet  3  . temazepam (RESTORIL) 15 MG capsule Take 1 capsule (15 mg total) by mouth at bedtime as needed for sleep.  30 capsule  3    Allergies  Allergen Reactions  . Demerol   . Ebastine     EBS  . Ibuprofen   . Tetanus Toxoids   . Tetracyclines & Related     Patient Active Problem List  Diagnosis  . Atrial fibrillation  . Benign hypertensive heart disease without heart failure  . Mitral regurgitation  . Hypothyroidism  . Hypercholesterolemia  . Fatigue  . Cough    History  Smoking status  . Never Smoker   Smokeless tobacco  . Not on file    History  Alcohol Use No    Family History  Problem Relation Age of Onset  . Heart attack Neg Hx   .  Heart disease Neg Hx     Review of Systems: Constitutional: no fever chills diaphoresis or fatigue or change in weight.  Head and neck: no hearing loss, no epistaxis, no photophobia or visual disturbance. Respiratory: No cough, shortness of breath or wheezing. Cardiovascular: No chest pain peripheral edema, palpitations. Gastrointestinal: No abdominal distention, no abdominal pain, no change in bowel habits hematochezia or melena. Genitourinary: No dysuria, no frequency, no urgency, no nocturia. Musculoskeletal:No arthralgias, no back pain, no gait disturbance or myalgias. Neurological: No dizziness, no headaches, no numbness, no seizures, no syncope, no weakness, no tremors. Hematologic: No lymphadenopathy, no easy bruising. Psychiatric: No confusion, no hallucinations, no sleep disturbance.    Physical Exam: Filed Vitals:   03/12/12 1039  BP: 118/80  Pulse: 66   the general appearance reveals an elderly woman in no  distress.Pupils equal and reactive.   Extraocular Movements are full.  There is no scleral icterus.  The mouth and pharynx are normal.  The neck is supple.  The carotids reveal no bruits.  The jugular venous pressure is normal.  The thyroid is not enlarged.  There is no lymphadenopathy.  The chest is clear to percussion and auscultation. There are no rales or rhonchi. Expansion of the chest is symmetrical.  The precordium is quiet.  The first heart sound is normal.  The second heart sound is physiologically split.  There is a soft apical systolic murmur There is no abnormal lift or heave.  The rhythm is irregularly irregular. The abdomen is soft and nontender. Bowel sounds are normal. The liver and spleen are not enlarged. There Are no abdominal masses. There are no bruits.  The pedal pulses are good.  There is no phlebitis or edema.  There is no cyanosis or clubbing. Strength is normal and symmetrical in all extremities.  There is no lateralizing weakness.  There are no sensory deficits.  The skin is warm and dry.  There is no rash.  EKG shows no ischemic changes    Assessment / Plan:  Continue same medication.  Try sublingual nitroglycerin if pain recurs.  Return for [employer]

## 2012-03-12 NOTE — Assessment & Plan Note (Signed)
Her chest pain which occurred 36 hours ago began in her left shoulder blade area and radiated across her chest.  It also involved her left jaw and down her left arm to her hand.  She did not try sublingual nitroglycerin.  Her electrocardiogram today shows atrial fibrillation but no acute ST-T wave changes.  He is still having minimal residual left jaw pain.  She denies any recent dental problems.  For further evaluation of her chest discomfort she will return for a LexiScan Myoview soon.

## 2012-03-18 ENCOUNTER — Ambulatory Visit (HOSPITAL_COMMUNITY): Payer: Medicare Other | Attending: Cardiovascular Disease | Admitting: Radiology

## 2012-03-18 ENCOUNTER — Ambulatory Visit (INDEPENDENT_AMBULATORY_CARE_PROVIDER_SITE_OTHER): Payer: Medicare Other | Admitting: *Deleted

## 2012-03-18 VITALS — BP 100/68 | Ht 59.0 in | Wt 132.0 lb

## 2012-03-18 DIAGNOSIS — R0602 Shortness of breath: Secondary | ICD-10-CM

## 2012-03-18 DIAGNOSIS — I4891 Unspecified atrial fibrillation: Secondary | ICD-10-CM

## 2012-03-18 DIAGNOSIS — R079 Chest pain, unspecified: Secondary | ICD-10-CM

## 2012-03-18 DIAGNOSIS — Z7901 Long term (current) use of anticoagulants: Secondary | ICD-10-CM

## 2012-03-18 MED ORDER — AMINOPHYLLINE 25 MG/ML IV SOLN
75.0000 mg | Freq: Once | INTRAVENOUS | Status: AC
  Administered 2012-03-18: 75 mg via INTRAVENOUS

## 2012-03-18 MED ORDER — REGADENOSON 0.4 MG/5ML IV SOLN
0.4000 mg | Freq: Once | INTRAVENOUS | Status: AC
  Administered 2012-03-18: 0.4 mg via INTRAVENOUS

## 2012-03-18 MED ORDER — TECHNETIUM TC 99M TETROFOSMIN IV KIT
30.0000 | PACK | Freq: Once | INTRAVENOUS | Status: AC | PRN
  Administered 2012-03-18: 30 via INTRAVENOUS

## 2012-03-18 MED ORDER — TECHNETIUM TC 99M TETROFOSMIN IV KIT
10.0000 | PACK | Freq: Once | INTRAVENOUS | Status: AC | PRN
  Administered 2012-03-18: 10 via INTRAVENOUS

## 2012-03-18 NOTE — Progress Notes (Signed)
[facility] SITE 3 NUCLEAR MED [address] [place] [address] [phone]  Cardiology Nuclear Med Study  Kristen Whitehead is a 78 y.o. female     MRN : [id number]     DOB: 1923/05/04  Procedure Date: 03/18/2012  Nuclear Med Background Indication for Stress Test:  Evaluation for Ischemia History:  AFIB, 01/09, ECHO: EF: 55-60% mod MR mild AS 01/11 MPS: NL EF: 80%  (-) ischemia  Cardiac Risk Factors: Hypertension and Lipids  Symptoms:  Chest Pain, Fatigue and Palpitations   Nuclear Pre-Procedure Caffeine/Decaff Intake:  None NPO After: 11:00am   Lungs:  clear O2 Sat: 93% on room air. IV 0.9% NS with Angio Cath:  22g  IV Site: R Forearm  IV Started by:  Stanton Kidney, EMT-P  Chest Size (in):  38 Cup Size: B  Height: 4\' 11"  (1.499 m)  Weight:  132 lb (59.875 kg)  BMI:  Body mass index is 26.66 kg/(m^2). Tech Comments:  Atenolol held > 12 hours, per patient.    Nuclear Med Study 1 or 2 day study: 1 day  Stress Test Type:  Eugenie Birks  Reading MD: Charlton Haws, MD  Order Authorizing Provider:  T.Brackbill MD  Resting Radionuclide: Technetium 42m Tetrofosmin  Resting Radionuclide Dose: 10.9 mCi   Stress Radionuclide:  Technetium 71m Tetrofosmin  Stress Radionuclide Dose: 33.0 mCi           Stress Protocol Rest HR: 67 Stress HR: 126  Rest BP: 100/68 Stress BP: 126/77  Exercise Time (min): n/a METS: n/a   Predicted Max HR: 133 bpm % Max HR: 94.74 bpm Rate Pressure Product: 21308   Dose of Adenosine (mg):  n/a Dose of Lexiscan: 0.4 mg  Dose of Atropine (mg): n/a Dose of Dobutamine: n/a mcg/kg/min (at max HR)  Stress Test Technologist: Milana Na, EMT-P  Nuclear Technologist:  Domenic Polite, CNMT     Rest Procedure:  Myocardial perfusion imaging was performed at rest 45 minutes following the intravenous administration of Technetium 26m Tetrofosmin. Rest ECG: Atrial Fibrilliation  Stress Procedure:  The patient received IV Lexiscan 0.4 mg over  15-seconds.  Technetium 15m Tetrofosmin injected at 30-seconds.  There were no significant changes, + sob, and feels bad all over with Lexiscan.  Quantitative spect images were obtained after a 45 minute delay. Stress ECG: No significant change from baseline ECG  QPS Raw Data Images:  Normal; no motion artifact; normal heart/lung ratio. Stress Images:  Normal homogeneous uptake in all areas of the myocardium. Rest Images:  Normal homogeneous uptake in all areas of the myocardium. Subtraction (SDS):  Normal Transient Ischemic Dilatation (Normal <1.22):  0.99 Lung/Heart Ratio (Normal <0.45):  0.19  Quantitative Gated Spect Images QGS EDV:  47 ml QGS ESV:  12 ml  Impression Exercise Capacity:  Lexiscan with no exercise. BP Response:  Normal blood pressure response. Clinical Symptoms:  No significant symptoms noted. ECG Impression:  No significant ST segment change suggestive of ischemia. Comparison with Prior Nuclear Study: No images to compare  Overall Impression:  Normal stress nuclear study.  LV Ejection Fraction: 75%.  LV Wall Motion:  NL LV Function; NL Wall Motion    Charlton Haws

## 2012-03-20 ENCOUNTER — Telehealth: Payer: Self-pay | Admitting: *Deleted

## 2012-03-20 NOTE — Telephone Encounter (Signed)
Message copied by Burnell Blanks on Fri Mar 20, 2012  2:44 PM ------      Message from: Cassell Clement      Created: Thu Mar 19, 2012  2:47 PM       Stress test was normal.  No blockage. Continue same meds.

## 2012-03-20 NOTE — Telephone Encounter (Signed)
Advised of stress test results 

## 2012-03-22 ENCOUNTER — Other Ambulatory Visit: Payer: Self-pay | Admitting: Cardiology

## 2012-03-23 NOTE — Telephone Encounter (Signed)
Fax Received. Refill Completed. Kristen Whitehead (R.M.A)   

## 2012-04-16 ENCOUNTER — Other Ambulatory Visit (INDEPENDENT_AMBULATORY_CARE_PROVIDER_SITE_OTHER): Payer: Medicare Other

## 2012-04-16 ENCOUNTER — Ambulatory Visit (INDEPENDENT_AMBULATORY_CARE_PROVIDER_SITE_OTHER): Payer: Medicare Other | Admitting: Pharmacist

## 2012-04-16 ENCOUNTER — Ambulatory Visit (INDEPENDENT_AMBULATORY_CARE_PROVIDER_SITE_OTHER): Payer: Medicare Other | Admitting: Cardiology

## 2012-04-16 ENCOUNTER — Encounter: Payer: Self-pay | Admitting: Cardiology

## 2012-04-16 VITALS — BP 110/68 | HR 70 | Ht 60.0 in | Wt 135.0 lb

## 2012-04-16 DIAGNOSIS — I059 Rheumatic mitral valve disease, unspecified: Secondary | ICD-10-CM

## 2012-04-16 DIAGNOSIS — I4891 Unspecified atrial fibrillation: Secondary | ICD-10-CM

## 2012-04-16 DIAGNOSIS — E7800 Pure hypercholesterolemia, unspecified: Secondary | ICD-10-CM

## 2012-04-16 DIAGNOSIS — I340 Nonrheumatic mitral (valve) insufficiency: Secondary | ICD-10-CM

## 2012-04-16 DIAGNOSIS — Z7901 Long term (current) use of anticoagulants: Secondary | ICD-10-CM

## 2012-04-16 LAB — BASIC METABOLIC PANEL
Chloride: 102 mEq/L (ref 96–112)
Creatinine, Ser: 0.6 mg/dL (ref 0.4–1.2)
Potassium: 4 mEq/L (ref 3.5–5.1)

## 2012-04-16 LAB — HEPATIC FUNCTION PANEL
Albumin: 3.7 g/dL (ref 3.5–5.2)
Total Protein: 6.7 g/dL (ref 6.0–8.3)

## 2012-04-16 LAB — LIPID PANEL
Cholesterol: 199 mg/dL (ref 0–200)
LDL Cholesterol: 109 mg/dL — ABNORMAL HIGH (ref 0–99)
Triglycerides: 114 mg/dL (ref 0.0–149.0)
VLDL: 22.8 mg/dL (ref 0.0–40.0)

## 2012-04-16 NOTE — Assessment & Plan Note (Signed)
The patient has a history of mild mitral regurgitation.  She is on low-dose furosemide.  She objects to the furosemide because it keeps her running to the bathroom all the time.  We are going to decrease her furosemide to just 20 mg every third day.  She will also decrease her potassium to just every third day

## 2012-04-16 NOTE — Assessment & Plan Note (Signed)
The patient has chronic atrial fibrillation.  She is on Coumadin.  She has had no thromboembolic episodes.

## 2012-04-16 NOTE — Patient Instructions (Signed)
Decrease your Lasix to 40 mg 1/2 tablet to every 3 days  Decrease your Potassium to 1 every 3 days   Your physician recommends that you schedule a follow-up appointment in: 3 month ov/ekg

## 2012-04-16 NOTE — Progress Notes (Signed)
Kristen Whitehead Date of Birth:  07/01/1923 [facility] [address] [place], [place]  [address] [phone]  Fax   [phone]  HPI: This pleasant 78 year old woman is seen for a scheduled followup office visit.  She has a history of established atrial fibrillation.  She does not have any history of ischemic heart disease.  She had a normal Myoview stress test in January 2011.  Her saw her as a work in on 03/12/12 and she had another Myoview on 03/18/12 which showed no evidence of ischemia and her ejection fraction was 75% The patient has a history of compensated diastolic congestive heart failure.  She has been on furosemide 20 mg daily.  Current Outpatient Prescriptions  Medication Sig Dispense Refill  . amLODipine (NORVASC) 5 MG tablet TAKE 1/2 TABLET DAILY.  90 tablet  PRN  . atenolol (TENORMIN) 25 MG tablet take 1 tablet by mouth twice a day  180 tablet  3  . calcium gluconate 500 MG tablet Take 500 mg by mouth daily.        . Cholecalciferol (VITAMIN D) 2000 UNITS tablet Take 5,000 Units by mouth daily.       Marland Kitchen estradiol (ESTRACE VAGINAL) 0.1 MG/GM vaginal cream Place 2 g vaginally. Twice a week      . furosemide (LASIX) 40 MG tablet 1/2 tab every 3 days      . isosorbide dinitrate (ISORDIL) 30 MG tablet Take 30 mg by mouth daily.       . isosorbide mononitrate (IMDUR) 30 MG 24 hr tablet take 1 tablet by mouth once daily  30 tablet  5  . levothyroxine (SYNTHROID, LEVOTHROID) 75 MCG tablet Take 75 mcg by mouth daily.        . nitroGLYCERIN (NITROSTAT) 0.4 MG SL tablet Place 1 tablet (0.4 mg total) under the tongue every 5 (five) minutes as needed.  25 tablet  5  . potassium chloride SA (K-DUR,KLOR-CON) 20 MEQ tablet Take 20 mEq by mouth as directed. 1 tablet every 3 days      . temazepam (RESTORIL) 30 MG capsule Take 30 mg by mouth at bedtime as needed. As needed for sleep       . travoprost, benzalkonium, (TRAVATAN) 0.004 % ophthalmic solution Place 1 drop  into both eyes at bedtime.        Marland Kitchen warfarin (COUMADIN) 5 MG tablet Take as directed by [facility]  30 tablet  3  . DISCONTD: furosemide (LASIX) 40 MG tablet 1/2 tab podaily  30 tablet  11  . DISCONTD: potassium chloride SA (K-DUR,KLOR-CON) 20 MEQ tablet Take 1 tablet (20 mEq total) by mouth daily.  30 tablet  11  . temazepam (RESTORIL) 15 MG capsule Take 1 capsule (15 mg total) by mouth at bedtime as needed for sleep.  30 capsule  3    Allergies  Allergen Reactions  . Demerol   . Ebastine     EBS  . Ibuprofen   . Tetanus Toxoids   . Tetracyclines & Related     Patient Active Problem List  Diagnosis  . Atrial fibrillation  . Benign hypertensive heart disease without heart failure  . Mitral regurgitation  . Hypothyroidism  . Hypercholesterolemia  . Fatigue  . Cough  . Chest pain at rest    History  Smoking status  . Never Smoker   Smokeless tobacco  . Not on file    History  Alcohol Use No    Family History  Problem Relation  Age of Onset  . Heart attack Neg Hx   . Heart disease Neg Hx     Review of Systems: The patient denies any heat or cold intolerance.  No weight gain or weight loss.  The patient denies headaches or blurry vision.  There is no cough or sputum production.  The patient denies dizziness.  There is no hematuria or hematochezia.  The patient denies any muscle aches or arthritis.  The patient denies any rash.  The patient denies frequent falling or instability.  There is no history of depression or anxiety.  All other systems were reviewed and are negative.   Physical Exam: Filed Vitals:   04/16/12 1055  BP: 110/68  Pulse: 70   the general appearance reveals a well-developed elderly woman who appears younger than her stated age.The head and neck exam reveals pupils equal and reactive.  Extraocular movements are full.  There is no scleral icterus.  The mouth and pharynx are normal.  The neck is supple.  The carotids reveal no bruits.  The  jugular venous pressure is normal.  The  thyroid is not enlarged.  There is no lymphadenopathy.  The chest is clear to percussion and auscultation.  There are no rales or rhonchi.  Expansion of the chest is symmetrical.  The pulse is irregularly irregular The precordium is quiet.  The first heart sound is normal.  The second heart sound is physiologically split.  There is no  gallop rub or click.  There is a soft apical systolic murmur There is no abnormal lift or heave.  The abdomen is soft and nontender.  The bowel sounds are normal.  The liver and spleen are not enlarged.  There are no abdominal masses.  There are no abdominal bruits.  Extremities reveal good pedal pulses.  There is no phlebitis or edema.  There is no cyanosis or clubbing.  Strength is normal and symmetrical in all extremities.  There is no lateralizing weakness.  There are no sensory deficits.  The skin is warm and dry.  There is no rash.      Assessment / Plan:  Continue same medication except for the reduction in furosemide in the reduction in potassium to allow her not to have so much urinary frequency.  If she begins to retain fluid we will have to go back up on the dose.  She will continue to limit her dietary salt. Recheck in 3 months for office visit and EKG

## 2012-04-18 NOTE — Progress Notes (Signed)
Quick Note:  Please report to patient. The recent labs are stable. Continue same medication and careful diet.Cholesterol has gone back up. Watch diet carefully. ______

## 2012-05-05 ENCOUNTER — Telehealth: Payer: Self-pay | Admitting: Cardiology

## 2012-05-05 ENCOUNTER — Ambulatory Visit (INDEPENDENT_AMBULATORY_CARE_PROVIDER_SITE_OTHER): Payer: Medicare Other

## 2012-05-05 DIAGNOSIS — I4891 Unspecified atrial fibrillation: Secondary | ICD-10-CM

## 2012-05-05 DIAGNOSIS — Z7901 Long term (current) use of anticoagulants: Secondary | ICD-10-CM

## 2012-05-05 LAB — POCT INR: INR: 2.8

## 2012-05-05 NOTE — Telephone Encounter (Signed)
plz return call to patient at hm#, c/o of pain in left arm. Pt said she has not slept well, been up all night.

## 2012-05-05 NOTE — Telephone Encounter (Signed)
Per Weston Brass, pt. Is scheduled to have INR check today @ 12:15, pt. Aware.

## 2012-05-19 ENCOUNTER — Telehealth: Payer: Self-pay | Admitting: Cardiology

## 2012-05-19 MED ORDER — FUROSEMIDE 40 MG PO TABS: ORAL_TABLET | ORAL | Status: DC

## 2012-05-19 MED ORDER — ISOSORBIDE MONONITRATE ER 30 MG PO TB24: 30.0000 mg | ORAL_TABLET | Freq: Every day | ORAL | Status: DC

## 2012-05-19 NOTE — Telephone Encounter (Signed)
New problem:  Would like to discuss medication.

## 2012-05-19 NOTE — Telephone Encounter (Signed)
Refilled as requested  

## 2012-05-26 ENCOUNTER — Telehealth: Payer: Self-pay | Admitting: Cardiology

## 2012-05-26 NOTE — Telephone Encounter (Signed)
Pt requesting appt , not sure if here or pcp, feeling fatigue, last night had chest pain and took a nitro, felt better

## 2012-05-26 NOTE — Telephone Encounter (Signed)
At midnight on Saturday blood pressure up to 130's-140's and just didn't feel well.  Has been having problems with fatigue for last couple of weeks and just getting worse, visual changes (blurred vision) and headache for last several days.  Headache does seem to be some better this morning.   Patient stated that she had decreased her potassium and lasix but now back on what she was taking. Patient had a normal myoview 03/18/12, advised patient to contact her PCP to be evaluated, verbalized understanding.

## 2012-05-26 NOTE — Telephone Encounter (Signed)
Called patient to follow up and make sure seeing PCP, states she has appointment in 45 minutes with primary care office

## 2012-05-28 ENCOUNTER — Emergency Department (HOSPITAL_COMMUNITY): Payer: Medicare Other

## 2012-05-28 ENCOUNTER — Encounter (HOSPITAL_COMMUNITY): Payer: Self-pay | Admitting: Family Medicine

## 2012-05-28 ENCOUNTER — Emergency Department (HOSPITAL_COMMUNITY)
Admission: EM | Admit: 2012-05-28 | Discharge: 2012-05-28 | Disposition: A | Discharge status: Home / Self Care | Payer: Medicare Other | Attending: Emergency Medicine | Admitting: Emergency Medicine

## 2012-05-28 DIAGNOSIS — E7800 Pure hypercholesterolemia, unspecified: Secondary | ICD-10-CM | POA: Insufficient documentation

## 2012-05-28 DIAGNOSIS — R7989 Other specified abnormal findings of blood chemistry: Secondary | ICD-10-CM

## 2012-05-28 DIAGNOSIS — I10 Essential (primary) hypertension: Secondary | ICD-10-CM | POA: Insufficient documentation

## 2012-05-28 DIAGNOSIS — K219 Gastro-esophageal reflux disease without esophagitis: Secondary | ICD-10-CM | POA: Insufficient documentation

## 2012-05-28 DIAGNOSIS — F29 Unspecified psychosis not due to a substance or known physiological condition: Secondary | ICD-10-CM | POA: Insufficient documentation

## 2012-05-28 DIAGNOSIS — R5383 Other fatigue: Secondary | ICD-10-CM | POA: Insufficient documentation

## 2012-05-28 DIAGNOSIS — I4891 Unspecified atrial fibrillation: Secondary | ICD-10-CM | POA: Insufficient documentation

## 2012-05-28 DIAGNOSIS — R5381 Other malaise: Secondary | ICD-10-CM | POA: Insufficient documentation

## 2012-05-28 DIAGNOSIS — R51 Headache: Secondary | ICD-10-CM | POA: Insufficient documentation

## 2012-05-28 DIAGNOSIS — E039 Hypothyroidism, unspecified: Secondary | ICD-10-CM | POA: Insufficient documentation

## 2012-05-28 DIAGNOSIS — R799 Abnormal finding of blood chemistry, unspecified: Secondary | ICD-10-CM | POA: Insufficient documentation

## 2012-05-28 DIAGNOSIS — Z79899 Other long term (current) drug therapy: Secondary | ICD-10-CM | POA: Insufficient documentation

## 2012-05-28 DIAGNOSIS — R0602 Shortness of breath: Secondary | ICD-10-CM | POA: Insufficient documentation

## 2012-05-28 DIAGNOSIS — R079 Chest pain, unspecified: Secondary | ICD-10-CM | POA: Insufficient documentation

## 2012-05-28 HISTORY — DX: Cystocele, unspecified: N81.10

## 2012-05-28 LAB — CBC
HCT: 46 % (ref 36.0–46.0)
Hemoglobin: 15.7 g/dL — ABNORMAL HIGH (ref 12.0–15.0)
MCH: 30.9 pg (ref 26.0–34.0)
MCHC: 34.1 g/dL (ref 30.0–36.0)

## 2012-05-28 LAB — URINE MICROSCOPIC-ADD ON

## 2012-05-28 LAB — URINALYSIS, ROUTINE W REFLEX MICROSCOPIC
Nitrite: NEGATIVE
Specific Gravity, Urine: 1.005 (ref 1.005–1.030)
pH: 7.5 (ref 5.0–8.0)

## 2012-05-28 LAB — COMPREHENSIVE METABOLIC PANEL
BUN: 7 mg/dL (ref 6–23)
Calcium: 9 mg/dL (ref 8.4–10.5)
Creatinine, Ser: 0.53 mg/dL (ref 0.50–1.10)
GFR calc Af Amer: >90 mL/min (ref >90)
Glucose, Bld: 91 mg/dL (ref 70–99)
Sodium: 139 mEq/L (ref 135–145)
Total Protein: 8.1 g/dL (ref 6.0–8.3)

## 2012-05-28 LAB — POCT I-STAT TROPONIN I

## 2012-05-28 LAB — PRO B NATRIURETIC PEPTIDE: Pro B Natriuretic peptide (BNP): 1429 pg/mL — ABNORMAL HIGH (ref 0–450)

## 2012-05-28 LAB — PROTIME-INR: INR: 3.02 — ABNORMAL HIGH (ref 0.00–1.49)

## 2012-05-28 MED ORDER — ONDANSETRON HCL 4 MG/2ML IJ SOLN
INTRAMUSCULAR | Status: AC
  Filled 2012-05-28: qty 2

## 2012-05-28 MED ORDER — SODIUM CHLORIDE 0.9 % IV SOLN
1000.0000 mL | INTRAVENOUS | Status: DC
  Administered 2012-05-28: 1000 mL via INTRAVENOUS

## 2012-05-28 MED ORDER — ONDANSETRON HCL 4 MG/2ML IJ SOLN
4.0000 mg | Freq: Once | INTRAMUSCULAR | Status: AC
  Administered 2012-05-28: 4 mg via INTRAVENOUS

## 2012-05-28 NOTE — ED Notes (Signed)
Pt c/o generalized weakness and left sided headache.  Pt c/o dyspnea at rest and on exertion.  Pt states saw a physician Dr. Ihor Dow on Wednesday afternoon and had blood tests drawn.  Pt states Dr. Ihor Dow called today at 3pm with the test results and requested pt come to ED because "The test results looked like I was in heart failure".

## 2012-05-28 NOTE — ED Provider Notes (Signed)
History     CSN: [id number] Arrival date & time 05/28/12  1642 First MD Initiated Contact with Patient 05/28/12 1900     Chief Complaint  Patient presents with  . Weakness    HPI SHe has been feeling fatigued for weeks.  She has had some pain in her chest at times as well as shortness of breath.  That is intermittent.  She has been taking her nitro without relief.  No history of CAD. Some pain with deep breathing  She also has had some headache on the left side of her head and usually does not get headaches.  She had tests at her primary care doctors's office on Wednesday.  She was told today that her BNP was high and she should come to the ED.  Pt states she just does not want to do anything.  Hx normal stress test 3-4 months ago. Past Medical History  Diagnosis Date  . History of mitral valve prolapse 06/15/2008  . Chronic atrial fibrillation   . Fatigue   . History of congenital mitral regurgitation     mild  . Edema   . Hypothyroidism   . Hypercholesterolemia   . GERD (gastroesophageal reflux disease)   . Chest pain   . Hypertension   . Bladder prolapse, female, acquired     Past Surgical History  Procedure Date  . Thyroidectomy   . Cholecystectomy   . Abdominal hysterectomy     Family History  Problem Relation Age of Onset  . Heart attack Neg Hx   . Heart disease Neg Hx     History  Substance Use Topics  . Smoking status: Never Smoker   . Smokeless tobacco: Not on file  . Alcohol Use: No    OB History    Grav Para Term Preterm Abortions TAB SAB Ect Mult Living                  Review of Systems  Constitutional: Positive for fatigue. Negative for fever.  Respiratory: Negative for cough.   Cardiovascular: Positive for chest pain (none currently).  Gastrointestinal: Negative for nausea, vomiting and diarrhea.  Genitourinary: Negative for dysuria.  Musculoskeletal: Negative for myalgias.  Skin: Negative for rash.  Neurological: Positive for weakness  (general) and headaches. Negative for tremors, seizures and numbness.  Psychiatric/Behavioral: Positive for confusion (had some trouble finding words in conversation today).    Allergies  Demerol; Ebastine; Ibuprofen; Tetanus toxoids; and Tetracyclines & related  Home Medications   Current Outpatient Rx  Name Route Sig Dispense Refill  . AMLODIPINE BESYLATE 5 MG PO TABS Oral Take 2.5 mg by mouth daily.    . ATENOLOL 25 MG PO TABS Oral Take 25 mg by mouth 2 (two) times daily.    . OCUVITE PO TABS Oral Take 1 tablet by mouth daily.    Marland Kitchen CALCIUM CARBONATE 1250 MG PO TABS Oral Take 1 tablet by mouth daily.    Marland Kitchen VITAMIN D 2000 UNITS PO TABS Oral Take 2,000 Units by mouth daily.     Marland Kitchen ESTRADIOL 0.1 MG/GM VA CREA Vaginal Place 2 g vaginally 2 (two) times a week.     . FUROSEMIDE 20 MG PO TABS Oral Take 20 mg by mouth daily.    . ISOSORBIDE DINITRATE 30 MG PO TABS Oral Take 15 mg by mouth daily.     Marland Kitchen LEVOTHYROXINE SODIUM 75 MCG PO TABS Oral Take 75 mcg by mouth daily.      Marland Kitchen NITROGLYCERIN 0.4  MG SL SUBL Sublingual Place 0.4 mg under the tongue every 5 (five) minutes x 3 doses as needed. For chest pain.    Marland Kitchen POTASSIUM CHLORIDE CRYS ER 20 MEQ PO TBCR Oral Take 20 mEq by mouth every Monday, Wednesday, and Friday.     Marland Kitchen TEMAZEPAM 15 MG PO CAPS Oral Take 15 mg by mouth at bedtime.    . TRAVOPROST 0.004 % OP SOLN Left Eye Place 1 drop into the left eye at bedtime.     Marland Kitchen VITAMIN C 500 MG PO TABS Oral Take 500 mg by mouth daily.    . WARFARIN SODIUM 5 MG PO TABS Oral Take 5 mg by mouth daily.      BP 140/121  Pulse 66  Temp 98.2 F (36.8 C) (Oral)  Resp 11  SpO2 96%  Physical Exam  Nursing note and vitals reviewed. Constitutional: She is oriented to person, place, and time. She appears well-developed and well-nourished. No distress.  HENT:  Head: Normocephalic and atraumatic.  Right Ear: External ear normal.  Left Ear: External ear normal.  Eyes: Conjunctivae normal are normal. Right eye  exhibits no discharge. Left eye exhibits no discharge. No scleral icterus.  Neck: Neck supple. No tracheal deviation present.  Cardiovascular: Normal rate, regular rhythm and intact distal pulses.   Pulmonary/Chest: Effort normal and breath sounds normal. No stridor. No respiratory distress. She has no wheezes. She has no rales. She exhibits no tenderness.  Abdominal: Soft. Bowel sounds are normal. She exhibits no distension. There is no tenderness. There is no rebound and no guarding.  Musculoskeletal: She exhibits no edema and no tenderness.  Neurological: She is alert and oriented to person, place, and time. She has normal strength. No cranial nerve deficit ( no gross defecits noted) or sensory deficit. She exhibits normal muscle tone. She displays no seizure activity. Coordination normal.       Patient has been able to ambulate around the emergency department without difficulty  Skin: Skin is warm and dry. No rash noted.  Psychiatric: She has a normal mood and affect.    ED Course  Procedures (including critical care time)  Labs Reviewed  CBC - Abnormal; Notable for the following:    Hemoglobin 15.7 (*)     All other components within normal limits  COMPREHENSIVE METABOLIC PANEL - Abnormal; Notable for the following:    GFR calc non Af Amer 83 (*)     All other components within normal limits  URINALYSIS, ROUTINE W REFLEX MICROSCOPIC - Abnormal; Notable for the following:    Hgb urine dipstick SMALL (*)     Leukocytes, UA MODERATE (*)     All other components within normal limits  POCT I-STAT TROPONIN I  URINE MICROSCOPIC-ADD ON  PRO B NATRIURETIC PEPTIDE  PROTIME-INR   Dg Chest 2 View  05/28/2012  *RADIOLOGY REPORT*  Clinical Data: Headache, weakness, hypertension  CHEST - 2 VIEW  Comparison: 10/05/2008; 10/12/2005  Findings: Grossly unchanged cardiac silhouette and mediastinal contours and mild tortuosity of the thoracic aorta.  The lungs appear mildly hyperexpanded with  flattening of bilateral hemidiaphragms.  There is mild diffuse thickening of the pulmonary interstitium.  No focal airspace opacity.  No pleural effusion or pneumothorax.  No acute osseous abnormalities.  Post cholecystectomy.  IMPRESSION: Hyperexpanded lungs without acute cardiopulmonary disease.   Original Report Authenticated By: Waynard Reeds, M.D.    Ct Head Wo Contrast  05/28/2012  *RADIOLOGY REPORT*  Clinical Data: Generalized weakness with left  sided headache.  CT HEAD WITHOUT CONTRAST  Technique:  Contiguous axial images were obtained from the base of the skull through the vertex without contrast.  Comparison: Head CT 12/30/2008.  Findings: There is no evidence of acute intracranial hemorrhage, mass lesion, brain edema or extra-axial fluid collection.  The ventricles and subarachnoid spaces are appropriately sized for age. There is no CT evidence of acute cortical infarction.  Intracranial vascular calcifications are noted.  The visualized paranasal sinuses are clear. The calvarium is intact.  IMPRESSION: Stable age appropriate atrophy.  No acute intracranial findings.   Original Report Authenticated By: Gerrianne Scale, M.D.       MDM  Unclear as to the source of her fatigue.  No anemia.  No dehydration.  Pt does have an increase in her BNP but no overt sign of pulmonary edema on CXR.  This could be a contributing factor.  Pt does have BP that is mildly elevated.  Reviewed old records. Nl stress test in July of this year.  No history of CAD.  I have discussed her medications.  The pharmacy review suggest she is supposed to be on isordil and tenormin twice daily.  Pt states she is only taking medications in the morning.  Discussed with on call cardiology.  Will have her follow up with her cardiologist to review her medications as planned.  At this time, stable for discharge.        Celene Kras, MD 05/28/12 [phone]

## 2012-05-28 NOTE — ED Notes (Signed)
Patient is alert and oriented x3.  She was given DC instructions and follow up visit instructions.  Patient gave verbal understanding. She was DC via wheelchair with family to home.  V/S stable.  She was not showing any signs of distress on DC

## 2012-06-01 ENCOUNTER — Encounter: Payer: Self-pay | Admitting: Nurse Practitioner

## 2012-06-01 ENCOUNTER — Ambulatory Visit (INDEPENDENT_AMBULATORY_CARE_PROVIDER_SITE_OTHER): Payer: Medicare Other | Admitting: Nurse Practitioner

## 2012-06-01 VITALS — BP 110/66 | HR 73 | Resp 18 | Ht 59.0 in | Wt 134.4 lb

## 2012-06-01 DIAGNOSIS — R5381 Other malaise: Secondary | ICD-10-CM

## 2012-06-01 DIAGNOSIS — R5383 Other fatigue: Secondary | ICD-10-CM

## 2012-06-01 DIAGNOSIS — N390 Urinary tract infection, site not specified: Secondary | ICD-10-CM

## 2012-06-01 LAB — URINALYSIS WITH CULTURE, IF INDICATED
Bilirubin Urine: NEGATIVE
Ketones, ur: NEGATIVE
Nitrite: NEGATIVE
Specific Gravity, Urine: 1.01 (ref 1.000–1.030)
Total Protein, Urine: NEGATIVE
Urine Glucose: NEGATIVE
Urobilinogen, UA: 0.2 (ref 0.0–1.0)
pH: 8 (ref 5.0–8.0)

## 2012-06-01 MED ORDER — AMLODIPINE BESYLATE 5 MG PO TABS: ORAL_TABLET | ORAL | Status: DC

## 2012-06-01 NOTE — Progress Notes (Signed)
Kristen Whitehead Date of Birth: 1923-05-21 Medical Record #[id number]  History of Present Illness: [name] is seen back today for a work in visit. She is seen for Dr. Patty Sermons. She has a history of chronic atrial fib, on coumadin. Remote TIA. Other issues include diastolic heart failure, hypothyroidism, HTN and advanced age. She is 89. She does not have any known CAD and had a negative Myoview back in July showing nor ischemia and EF of 75%.   She comes in today. She is here with a friend. She says she is feeling "draggy" for the past several weeks. Feels "lazier". Has had some chest pains off and on. Uses NTG which helps. Blood pressure has been running higher at home. She originally went to [facility] for an evaluation. Had complete labs. BNP was up slightly. She was referred to the ER. There she had a CT of her head. Negative CXR. Labs were ok except she had a moderate amount of leukocytes in her urine. No med changes. She was told to follow up here. ProBNP there was 1400. She had already increased her Lasix back to every day due to some swelling. She is not all that short of breath. BP has been running higher at home. She has a headache. No longer on statin/Tricor due to "seizures" of her arms and legs which she says has improved greatly with the discontinuation of these medicines. Last echo in 2009 showed a normal EF with LVH.   Current Outpatient Prescriptions on File Prior to Visit  Medication Sig Dispense Refill  . atenolol (TENORMIN) 25 MG tablet Take 25 mg by mouth 2 (two) times daily.      . beta carotene w/minerals (OCUVITE) tablet Take 1 tablet by mouth daily.      . calcium carbonate (OS-CAL - DOSED IN MG OF ELEMENTAL CALCIUM) 1250 MG tablet Take 1 tablet by mouth daily.      . Cholecalciferol (VITAMIN D) 2000 UNITS tablet Take 2,000 Units by mouth daily.       Marland Kitchen estradiol (ESTRACE VAGINAL) 0.1 MG/GM vaginal cream Place 2 g vaginally 2 (two) times a week.       . furosemide  (LASIX) 20 MG tablet Take 20 mg by mouth daily.      . isosorbide dinitrate (ISORDIL) 30 MG tablet Take 30 mg by mouth daily.       Marland Kitchen levothyroxine (SYNTHROID, LEVOTHROID) 75 MCG tablet Take 75 mcg by mouth daily.        . nitroGLYCERIN (NITROSTAT) 0.4 MG SL tablet Place 0.4 mg under the tongue every 5 (five) minutes x 3 doses as needed. For chest pain.      . potassium chloride SA (K-DUR,KLOR-CON) 20 MEQ tablet Take 20 mEq by mouth every Monday, Wednesday, and Friday.       . temazepam (RESTORIL) 15 MG capsule Take 15 mg by mouth at bedtime.      . travoprost, benzalkonium, (TRAVATAN) 0.004 % ophthalmic solution Place 1 drop into the left eye at bedtime.       . vitamin C (ASCORBIC ACID) 500 MG tablet Take 500 mg by mouth daily.      Marland Kitchen warfarin (COUMADIN) 5 MG tablet Take 5 mg by mouth daily.        Allergies  Allergen Reactions  . Demerol   . Ebastine     EBS  . Ibuprofen   . Tetanus Toxoids   . Tetracyclines & Related     Past Medical History  Diagnosis Date  . History of mitral valve prolapse 06/15/2008  . Chronic atrial fibrillation     managed with rate control and coumadin  . Fatigue   . History of congenital mitral regurgitation     mild  . Edema   . Hypothyroidism   . Hypercholesterolemia   . GERD (gastroesophageal reflux disease)   . Chest pain     no known ischemic heart disease; negative Myoview July 2013. EF 75% with no ischemia.   . Hypertension   . Bladder prolapse, female, acquired   . Diastolic heart failure   . Chronic anticoagulation     Past Surgical History  Procedure Date  . Thyroidectomy   . Cholecystectomy   . Abdominal hysterectomy     History  Smoking status  . Never Smoker   Smokeless tobacco  . Not on file    History  Alcohol Use No    Family History  Problem Relation Age of Onset  . Heart attack Neg Hx   . Heart disease Neg Hx     Review of Systems: The review of systems is per the HPI.  All other systems were reviewed and  are negative.  Physical Exam: BP 110/66  Pulse 73  Resp 18  Ht 4\' 11"  (1.499 m)  Wt 134 lb 6.4 oz (60.963 kg)  BMI 27.15 kg/m2  SpO2 93% Patient is a very pleasant elderly female who is in no acute distress. Her weight is down a pound. Skin is warm and dry. Color is normal.  HEENT is unremarkable. Normocephalic/atraumatic. PERRL. Sclera are nonicteric. Neck is supple. No masses. No JVD. Lungs are clear. Cardiac exam shows an irregular rhythm. Rate is controlled. Abdomen is soft. Extremities are without any edema. Gait and ROM are intact. No gross neurologic deficits noted.  LABORATORY DATA:  Lab Results  Component Value Date   WBC 6.2 05/28/2012   HGB 15.7* 05/28/2012   HCT 46.0 05/28/2012   PLT 268 05/28/2012   GLUCOSE 91 05/28/2012   CHOL 199 04/16/2012   TRIG 114.0 04/16/2012   HDL 66.80 04/16/2012   LDLCALC 109* 04/16/2012   ALT 21 05/28/2012   AST 30 05/28/2012   NA 139 05/28/2012   K 3.8 05/28/2012   CL 100 05/28/2012   CREATININE 0.53 05/28/2012   BUN 7 05/28/2012   CO2 29 05/28/2012   TSH 0.19* 04/08/2011   INR 3.02* 05/28/2012    Lab Results  Component Value Date   INR 3.02* 05/28/2012   INR 2.8 05/05/2012   INR 2.3 04/16/2012   CXR IMPRESSION:  Hyperexpanded lungs without acute cardiopulmonary disease.  Original Report Authenticated By: Waynard Reeds, M.D.        Last Resulted: 05/28/12 7:59 PM     CT HEAD IMPRESSION: Stable age appropriate atrophy. No acute intracranial findings.   Original Report Authenticated By: Gerrianne Scale, M.D.        Last Resulted: 05/28/12 7:56 PM       Assessment / Plan: 1. Fatigue - probably multifactorial - need to recheck her urine today.   2. Elevated Pro BNP - she has already increased her Lasix. Weight is down. No evidence of volume overload on exam today.   3. HTN - Repeat blood pressure by me is 140/80. Her readings are elevated at home. She is agreeable to increasing her Norvasc to 2.5 mg BID. She  will continue to monitor.   4. Chest pain - has had a recent negative Myoview.  Has NTG on hand. Treating her blood pressure with extra Norvasc may help.  She is to see Dr. Patty Sermons next month. She is to monitor her blood pressure at home. Patient is agreeable to this plan and will call if any problems develop in the interim.

## 2012-06-01 NOTE — Patient Instructions (Addendum)
Increase your Norvasc to 1/2 in the am and 1/2 in the evening  Dr. Patty Sermons will see you next month  Monitor your blood pressure at home and record some readings for him to look at.  Let's recheck a urine today. You made need some antibiotics  You do not need to come tomorrow for your coumadin check.   Call the [facility] office at [phone] if you have any questions, problems or concerns.

## 2012-06-03 ENCOUNTER — Telehealth: Payer: Self-pay | Admitting: Cardiology

## 2012-06-03 NOTE — Telephone Encounter (Signed)
Advised of results

## 2012-06-03 NOTE — Telephone Encounter (Signed)
New problem:  Test results.  

## 2012-06-05 ENCOUNTER — Telehealth: Payer: Self-pay | Admitting: Cardiology

## 2012-06-05 NOTE — Telephone Encounter (Signed)
Discussed urinalysis with patient and sent to her PCP

## 2012-06-05 NOTE — Telephone Encounter (Signed)
New Problem:    Patient called in wanting to speak with you because she wanted to know if she is supposed to come by for an urinalysis test.  Patient would like to have a call back today.  Please call back.

## 2012-06-08 ENCOUNTER — Other Ambulatory Visit: Payer: Self-pay | Admitting: Cardiology

## 2012-06-09 ENCOUNTER — Other Ambulatory Visit: Payer: Self-pay | Admitting: *Deleted

## 2012-06-10 ENCOUNTER — Encounter: Payer: Self-pay | Admitting: Cardiology

## 2012-06-11 ENCOUNTER — Encounter: Payer: Self-pay | Admitting: Cardiology

## 2012-06-22 ENCOUNTER — Telehealth: Payer: Self-pay | Admitting: Cardiology

## 2012-06-22 NOTE — Telephone Encounter (Signed)
Advised patient

## 2012-06-22 NOTE — Telephone Encounter (Signed)
plz return call to patient [phone], elevated BP and swelling

## 2012-06-22 NOTE — Telephone Encounter (Signed)
Feet have been swelling, dose sleep with them elevated some.  Swelling does not go down at night.  Did recently have Amlodipine increased by Dawayne Patricia NP mid October to 5 mg 1/2 twice a day. Patient did double Lasix to 40 mg over the weekend, has helped some. Does watch her salt intake closely. Will forward to  Dr. Patty Sermons for review.

## 2012-06-22 NOTE — Telephone Encounter (Signed)
I would try reducing the amlodipine back to just one half tablet once a day and see if the edema will resolve

## 2012-07-06 ENCOUNTER — Ambulatory Visit (INDEPENDENT_AMBULATORY_CARE_PROVIDER_SITE_OTHER): Payer: Medicare Other | Admitting: Cardiology

## 2012-07-06 ENCOUNTER — Encounter: Payer: Self-pay | Admitting: Cardiology

## 2012-07-06 ENCOUNTER — Ambulatory Visit (INDEPENDENT_AMBULATORY_CARE_PROVIDER_SITE_OTHER): Payer: Medicare Other | Admitting: *Deleted

## 2012-07-06 VITALS — BP 112/64 | HR 65 | Ht 60.0 in | Wt 138.0 lb

## 2012-07-06 DIAGNOSIS — I4891 Unspecified atrial fibrillation: Secondary | ICD-10-CM

## 2012-07-06 DIAGNOSIS — I119 Hypertensive heart disease without heart failure: Secondary | ICD-10-CM

## 2012-07-06 DIAGNOSIS — Z7901 Long term (current) use of anticoagulants: Secondary | ICD-10-CM

## 2012-07-06 DIAGNOSIS — I340 Nonrheumatic mitral (valve) insufficiency: Secondary | ICD-10-CM

## 2012-07-06 DIAGNOSIS — I059 Rheumatic mitral valve disease, unspecified: Secondary | ICD-10-CM

## 2012-07-06 NOTE — Patient Instructions (Addendum)
Your physician recommends that you continue on your current medications as directed. Please refer to the Current Medication list given to you today.  Your physician recommends that you schedule a follow-up appointment in: 4 month ov  

## 2012-07-06 NOTE — Assessment & Plan Note (Signed)
The patient is an established atrial fibrillation on warfarin.  She has not been experiencing any TIAs.  She is not having any falling spells.

## 2012-07-06 NOTE — Progress Notes (Signed)
Kristen Whitehead Date of Birth:  [birth year] [facility] [address] [place], [place]  [address] [phone]         Fax   [phone]  History of Present Illness: This pleasant 78 year old woman is seen for a scheduled followup office visit. She has a history of established atrial fibrillation. She does not have any history of ischemic heart disease. She had a normal Myoview stress test in January 2011. Kristen Whitehead saw her as a work in on 03/12/12 and she had another Myoview on 03/18/12 which showed no evidence of ischemia and her ejection fraction was 75%  The patient has a history of compensated diastolic congestive heart failure. She has been on furosemide 20 mg daily.   Current Outpatient Prescriptions  Medication Sig Dispense Refill  . amLODipine (NORVASC) 5 MG tablet 1/2 tablet once a day      . atenolol (TENORMIN) 25 MG tablet Take 25 mg by mouth 2 (two) times daily.      . beta carotene w/minerals (OCUVITE) tablet Take 1 tablet by mouth daily.      . calcium carbonate (OS-CAL - DOSED IN MG OF ELEMENTAL CALCIUM) 1250 MG tablet Take 1 tablet by mouth daily.      . Cholecalciferol (VITAMIN D) 2000 UNITS tablet Take 2,000 Units by mouth daily.       Marland Kitchen estradiol (ESTRACE VAGINAL) 0.1 MG/GM vaginal cream Place 2 g vaginally 2 (two) times a week.       . furosemide (LASIX) 20 MG tablet Take 20 mg by mouth daily.      . isosorbide dinitrate (ISORDIL) 30 MG tablet Take 30 mg by mouth daily.       Marland Kitchen levothyroxine (SYNTHROID, LEVOTHROID) 75 MCG tablet Take 75 mcg by mouth daily.        . nitroGLYCERIN (NITROSTAT) 0.4 MG SL tablet Place 0.4 mg under the tongue every 5 (five) minutes x 3 doses as needed. For chest pain.      . potassium chloride SA (K-DUR,KLOR-CON) 20 MEQ tablet Take 20 mEq by mouth every Monday, Wednesday, and Friday.       . temazepam (RESTORIL) 15 MG capsule Take 15 mg by mouth at bedtime.      . travoprost, benzalkonium, (TRAVATAN) 0.004 % ophthalmic  solution Place 1 drop into the left eye at bedtime.       . vitamin C (ASCORBIC ACID) 500 MG tablet Take 500 mg by mouth daily.      Marland Kitchen warfarin (COUMADIN) 5 MG tablet take as directed BY COUMADIN CLINIC  40 tablet  3    Allergies  Allergen Reactions  . Demerol   . Ebastine     EBS  . Ibuprofen   . Statins     myalgias  . Tetanus Toxoids   . Tetracyclines & Related     Patient Active Problem List  Diagnosis  . Atrial fibrillation  . Benign hypertensive heart disease without heart failure  . Mitral regurgitation  . Hypothyroidism  . Hypercholesterolemia  . Fatigue  . Cough  . Chest pain at rest    History  Smoking status  . Never Smoker   Smokeless tobacco  . Not on file    History  Alcohol Use No    Family History  Problem Relation Age of Onset  . Heart attack Neg Hx   . Heart disease Neg Hx     Review of Systems: Constitutional: no fever chills diaphoresis or fatigue or change in  weight.  Head and neck: no hearing loss, no epistaxis, no photophobia or visual disturbance. Respiratory: No cough, shortness of breath or wheezing. Cardiovascular: No chest pain peripheral edema, palpitations. Gastrointestinal: No abdominal distention, no abdominal pain, no change in bowel habits hematochezia or melena. Genitourinary: No dysuria, no frequency, no urgency, no nocturia. Musculoskeletal:No arthralgias, no back pain, no gait disturbance or myalgias. Neurological: No dizziness, no headaches, no numbness, no seizures, no syncope, no weakness, no tremors. Hematologic: No lymphadenopathy, no easy bruising. Psychiatric: No confusion, no hallucinations, no sleep disturbance.    Physical Exam: Filed Vitals:   07/06/12 1420  BP: 112/64  Pulse: 65   the general appearance reveals a well-developed well-nourished elderly woman in no distress.The head and neck exam reveals pupils equal and reactive.  Extraocular movements are full.  There is no scleral icterus.  The mouth  and pharynx are normal.  The neck is supple.  The carotids reveal no bruits.  The jugular venous pressure is normal.  The  thyroid is not enlarged.  There is no lymphadenopathy.  The chest is clear to percussion and auscultation.  There are no rales or rhonchi.  Expansion of the chest is symmetrical.  The precordium is quiet.  The first heart sound is normal.  The second heart sound is physiologically split.  There is a soft grade 1/6 murmur of mitral regurgitation and apex.  The rhythm is irregular in atrial fibrillation  There is no abnormal lift or heave.  The abdomen is soft and nontender.  The bowel sounds are normal.  The liver and spleen are not enlarged.  There are no abdominal masses.  There are no abdominal bruits.  Extremities reveal good pedal pulses.  There is no phlebitis or edema.  There is no cyanosis or clubbing.  Strength is normal and symmetrical in all extremities.  There is no lateralizing weakness.  There are no sensory deficits.  The skin is warm and dry.  There is no rash.  EKG today she atrial fibrillation with a controlled ventricular response and no significant change since 05/28/12  Assessment / Plan: Continue same medication.  Okay to increase physical activity.  Check in 4 months for followup office visit.  Consider followup echo after next visit.

## 2012-07-06 NOTE — Assessment & Plan Note (Signed)
Blood pressure in the past has been labile but recently blood pressure has been in a therapeutic range and she has been feeling well

## 2012-07-06 NOTE — Assessment & Plan Note (Signed)
Patient has not had any symptoms of CHF.  She has been getting regular exercise by going to the [facility] and doing water walking exercises and I encouraged her to continue to do that

## 2012-08-03 ENCOUNTER — Other Ambulatory Visit: Payer: Self-pay | Admitting: Internal Medicine

## 2012-08-03 DIAGNOSIS — C73 Malignant neoplasm of thyroid gland: Secondary | ICD-10-CM

## 2012-08-07 ENCOUNTER — Ambulatory Visit (INDEPENDENT_AMBULATORY_CARE_PROVIDER_SITE_OTHER): Payer: Medicare Other | Admitting: Pharmacist

## 2012-08-07 DIAGNOSIS — Z7901 Long term (current) use of anticoagulants: Secondary | ICD-10-CM

## 2012-08-07 DIAGNOSIS — I4891 Unspecified atrial fibrillation: Secondary | ICD-10-CM

## 2012-08-17 ENCOUNTER — Ambulatory Visit
Admission: RE | Admit: 2012-08-17 | Discharge: 2012-08-17 | Disposition: A | Discharge status: Home / Self Care | Payer: Medicare Other | Source: Ambulatory Visit | Attending: Internal Medicine | Admitting: Internal Medicine

## 2012-08-17 DIAGNOSIS — C73 Malignant neoplasm of thyroid gland: Secondary | ICD-10-CM

## 2012-09-02 ENCOUNTER — Telehealth: Payer: Self-pay | Admitting: Physician Assistant

## 2012-09-02 NOTE — Telephone Encounter (Signed)
Patient called the answering service re: hand discoloration and pain. She takes Coumadin for atrial fibrillation, and has had difficulty maintaining a therapeutic INR in the past. She does not recall injuring her hand. It was painful throughout the day today, then she noticed a well defined blue/black lesion near her wrist, and bluish discoloration spreading to her hand. She has not noticed bleeding elsewhere. She otherwise has no other complaints, and has been doing well. She has a Coumadin appointment on Friday, but would like to be seen sooner as the discoloration is worrying her. Advised this may represent a hematoma with bruising which is common on blood thinners. Given her history of difficulty to control INRs, would like to exclude Coumadin-induced coagulopathy. Will call to see if she can have her appointment moved to tomorrow, and have someone evaluate her hand. Advised if it becomes excruciatingly painful or she experiences sensation changes in her hand, to present to the ED/urgent care. She understood and agreed.   Jacqulyn Bath, PA-C 09/02/2012 9:44 PM

## 2012-09-03 ENCOUNTER — Ambulatory Visit (INDEPENDENT_AMBULATORY_CARE_PROVIDER_SITE_OTHER): Payer: Medicare PPO | Admitting: *Deleted

## 2012-09-03 ENCOUNTER — Telehealth: Payer: Self-pay | Admitting: *Deleted

## 2012-09-03 DIAGNOSIS — I4891 Unspecified atrial fibrillation: Secondary | ICD-10-CM

## 2012-09-03 DIAGNOSIS — Z7901 Long term (current) use of anticoagulants: Secondary | ICD-10-CM

## 2012-09-03 LAB — POCT INR: INR: 4.6

## 2012-09-03 NOTE — Telephone Encounter (Signed)
Pt called stating has been having problems with HTN and that she has had edema left arm and noted black area at wrist that is 4 inches long and 2 inches wide and now has red streaks noted  States hand is not sore now. Did call PA on call last night and this nurse changed pt appt to today instead of on Friday to have INR checked and to assess area on arm.

## 2012-09-09 ENCOUNTER — Other Ambulatory Visit: Payer: Self-pay

## 2012-09-09 MED ORDER — NITROGLYCERIN 0.4 MG SL SUBL: 0.4000 mg | SUBLINGUAL_TABLET | SUBLINGUAL | Status: DC | PRN

## 2012-09-10 ENCOUNTER — Encounter: Payer: Self-pay | Admitting: Physician Assistant

## 2012-09-10 ENCOUNTER — Ambulatory Visit (INDEPENDENT_AMBULATORY_CARE_PROVIDER_SITE_OTHER): Payer: Medicare PPO | Admitting: Pharmacist

## 2012-09-10 ENCOUNTER — Ambulatory Visit (INDEPENDENT_AMBULATORY_CARE_PROVIDER_SITE_OTHER): Payer: Medicare PPO | Admitting: Physician Assistant

## 2012-09-10 ENCOUNTER — Telehealth: Payer: Self-pay | Admitting: Physician Assistant

## 2012-09-10 VITALS — BP 160/82 | HR 68 | Ht 60.0 in | Wt 140.1 lb

## 2012-09-10 DIAGNOSIS — R252 Cramp and spasm: Secondary | ICD-10-CM

## 2012-09-10 DIAGNOSIS — R5383 Other fatigue: Secondary | ICD-10-CM

## 2012-09-10 DIAGNOSIS — R0602 Shortness of breath: Secondary | ICD-10-CM

## 2012-09-10 DIAGNOSIS — Z7901 Long term (current) use of anticoagulants: Secondary | ICD-10-CM

## 2012-09-10 DIAGNOSIS — I059 Rheumatic mitral valve disease, unspecified: Secondary | ICD-10-CM

## 2012-09-10 DIAGNOSIS — I4891 Unspecified atrial fibrillation: Secondary | ICD-10-CM

## 2012-09-10 DIAGNOSIS — R5381 Other malaise: Secondary | ICD-10-CM

## 2012-09-10 DIAGNOSIS — I10 Essential (primary) hypertension: Secondary | ICD-10-CM

## 2012-09-10 DIAGNOSIS — I340 Nonrheumatic mitral (valve) insufficiency: Secondary | ICD-10-CM

## 2012-09-10 LAB — BASIC METABOLIC PANEL
BUN: 9 mg/dL (ref 6–23)
CO2: 32 mEq/L (ref 19–32)
Chloride: 101 mEq/L (ref 96–112)
Creatinine, Ser: 0.6 mg/dL (ref 0.4–1.2)
Glucose, Bld: 91 mg/dL (ref 70–99)
Potassium: 4 mEq/L (ref 3.5–5.1)

## 2012-09-10 MED ORDER — POTASSIUM CHLORIDE ER 10 MEQ PO TBCR: 10.0000 meq | EXTENDED_RELEASE_TABLET | Freq: Every day | ORAL | Status: DC

## 2012-09-10 MED ORDER — FUROSEMIDE 20 MG PO TABS: 20.0000 mg | ORAL_TABLET | Freq: Every day | ORAL | Status: DC

## 2012-09-10 NOTE — Patient Instructions (Addendum)
START LASIX 20 MG DAILY START POTASSIUM 10 MEQ DAILY  LABS TODAY; BMET, CBC W.DIFF, TSH, BNP, B12, IRON, FERRITIN  REPEAT BMET 1 WEEK  Your physician has requested that you have an echocardiogram DX MITRAL REGURGITATION. Echocardiography is a painless test that uses sound waves to create images of your heart. It provides your doctor with information about the size and shape of your heart and how well your heart's chambers and valves are working. This procedure takes approximately one hour. There are no restrictions for this procedure.  YOU HAVE A FOLLOW UP APPT WITH DR. Patty Sermons ON 11/04/12 @ 2:30

## 2012-09-10 NOTE — Telephone Encounter (Signed)
Pt was in today, would like rx for dizziness at [facility] [phone]

## 2012-09-10 NOTE — Progress Notes (Signed)
[address] [place], [place]  [address] Phone: [phone], Fax:  [phone]  Date:  09/10/2012   ID:  Kristen, Whitehead Dec 09, 1922, MRN [id number]  PCP:  Mickie Hillier, MD  Primary Cardiologist:  Dr. Cassell Clement     History of Present Illness: Kristen Whitehead is a 79 y.o. female who returns for followup on bruising.  She has a hx of AFib, HTN, HL, hypothyroidism.  Echo 1/09: EF 55-60%, mild LVH, trivial AI, mild to mod MR.  Lexiscan Myoview 7/13: no ischemia, EF 75%. Patient recently was seen by one of our nurse practitioners when she presented to the Coumadin clinic and noted some discoloration and pain in her left arm. By her description, it sounds as though she had extensive ecchymosis. This is completely resolved. She had a supratherapeutic INR that day. She is due for repeat INR today. The patient does note a history of dyspnea with exertion. She seems to be describing class IIb-III symptoms. She has occasional orthopnea and what sounds like PND. She denies significant cough. She has occasional atypical chest pain. She has been evaluated for this in the past. As noted, a Myoview was normal in the last 12 months. She thinks she may hear some wheezing at times. She denies syncope.  Labs (10/13): K 3.8, creatinine 0.53, ALT 21, BNP 1429, Hgb 15.7  Wt Readings from Last 3 Encounters:  09/10/12 140 lb 1.9 oz (63.558 kg)  07/06/12 138 lb (62.596 kg)  06/01/12 134 lb 6.4 oz (60.963 kg)     Past Medical History  Diagnosis Date  . History of mitral valve prolapse 06/15/2008  . Chronic atrial fibrillation     managed with rate control and coumadin  . Fatigue   . History of congenital mitral regurgitation     mild  . Edema   . Hypothyroidism   . Hypercholesterolemia   . GERD (gastroesophageal reflux disease)   . Chest pain     no known ischemic heart disease; negative Myoview July 2013. EF 75% with no ischemia.   . Hypertension   .  Bladder prolapse, female, acquired   . Diastolic heart failure   . Chronic anticoagulation     Current Outpatient Prescriptions  Medication Sig Dispense Refill  . amLODipine (NORVASC) 5 MG tablet 1/2 tablet once a day      . atenolol (TENORMIN) 25 MG tablet Take 25 mg by mouth 2 (two) times daily.      . beta carotene w/minerals (OCUVITE) tablet Take 1 tablet by mouth daily.      . calcium carbonate (OS-CAL - DOSED IN MG OF ELEMENTAL CALCIUM) 1250 MG tablet Take 1 tablet by mouth daily.      . Cholecalciferol (VITAMIN D) 2000 UNITS tablet Take 2,000 Units by mouth daily.       Marland Kitchen estradiol (ESTRACE VAGINAL) 0.1 MG/GM vaginal cream Place 2 g vaginally 2 (two) times a week.       . isosorbide dinitrate (ISORDIL) 30 MG tablet Take 30 mg by mouth daily.       Marland Kitchen levothyroxine (SYNTHROID, LEVOTHROID) 75 MCG tablet Take 75 mcg by mouth daily.        . nitroGLYCERIN (NITROSTAT) 0.4 MG SL tablet Place 1 tablet (0.4 mg total) under the tongue every 5 (five) minutes x 3 doses as needed. For chest pain.  25 tablet  5  . potassium chloride SA (K-DUR,KLOR-CON) 20 MEQ tablet Take 20 mEq by mouth every Monday, Wednesday,  and Friday.       . temazepam (RESTORIL) 15 MG capsule Take 15 mg by mouth at bedtime.      . travoprost, benzalkonium, (TRAVATAN) 0.004 % ophthalmic solution Place 1 drop into the left eye at bedtime.       . vitamin C (ASCORBIC ACID) 500 MG tablet Take 500 mg by mouth daily.      Marland Kitchen warfarin (COUMADIN) 5 MG tablet take as directed BY COUMADIN CLINIC  40 tablet  3    Allergies:    Allergies  Allergen Reactions  . Demerol   . Ebastine     EBS  . Ibuprofen   . Statins     myalgias  . Tetanus Toxoids   . Tetracyclines & Related     Social History:  The patient  reports that she has never smoked. She does not have any smokeless tobacco history on file. She reports that she does not drink alcohol or use illicit drugs.   ROS:  Please see the history of present illness.   She complains  of fatigue. She also complains of significant cramping in her legs that sounds consistent with restless leg syndrome. All other systems reviewed and negative.   PHYSICAL EXAM: VS:  BP 160/82  Pulse 68  Ht 5' (1.524 m)  Wt 140 lb 1.9 oz (63.558 kg)  BMI 27.37 kg/m2 Well nourished, well developed, in no acute distress HEENT: normal Neck: + JVD Cardiac:  normal S1, S2; RRR; 1/6 systolic murmur heard best at the left lower sternal border Lungs:  clear to auscultation bilaterally, no wheezing, rhonchi or rales Abd: soft, nontender, no hepatomegaly Ext: no edema; left arm without edema or ecchymosis Skin: warm and dry Neuro:  CNs 2-12 intact, no focal abnormalities noted  EKG:  Atrial fibrillation, HR 66, left axis deviation, nonspecific ST-T wave changes, no change from prior tracing     ASSESSMENT AND PLAN:  1. Atrial Fibrillation:  I had a long discussion with the patient today regarding continuing Coumadin versus other options with novel oral anticoagulants. At this point in time, I would recommend she remain on Coumadin. I reviewed her chart. She has not had a significant amount of time outside of therapeutic range. She had several weeks back in the summer where she was below 2. However, since that time, she has remained above 2. Her most recent supratherapeutic INR was the first time it has been above 3 in a while.  She can certainly discuss this further with Dr. Patty Sermons in the future.  Eliquis is an option if she decides she wants to try a NOAC. 2. Mitral Regurgitation:  I am concerned that she is having symptoms of congestive heart failure from her mitral regurgitation. I will set her up for a followup echocardiogram. Check a basic metabolic panel, BNP today. I have asked her to restart her Lasix at 20 mg daily. She will also resume potassium 10 mEq daily. Check a basic metabolic panel in one week. 3. Hypertension:  I have asked her to restart Lasix. Continue to monitor blood pressure for  now. 4. Leg Pain:  This is most distressing for her. I have recommended she have her iron levels checked as this may be restless leg syndrome. I will check an iron level, ferritin and CBC today. She would also like a B12 checked.  5. Fatigue:  Check TSH, CBC and B12 level. 6. Disposition: She has a followup with Dr. Patty Sermons scheduled in March. She should keep that  appointment.  Luna Glasgow, PA-C  12:08 PM 09/10/2012

## 2012-09-11 NOTE — Telephone Encounter (Signed)
cb to advise to call PCP for rx for dizziness. pt then states she is doing better said she bent over and when she stood up she felt better, said thank you though for me cb, told pt she was welcome

## 2012-09-16 ENCOUNTER — Telehealth: Payer: Self-pay | Admitting: Pharmacist

## 2012-09-16 NOTE — Telephone Encounter (Signed)
Pt noted new bruising on her thighs this morning and called to see if she should take whole or 1/2 tab today.  Instructed her to take 1/2 tab today and she will keep her appt for INR check 1/30.

## 2012-09-17 ENCOUNTER — Ambulatory Visit (INDEPENDENT_AMBULATORY_CARE_PROVIDER_SITE_OTHER): Payer: Medicare PPO | Admitting: *Deleted

## 2012-09-17 ENCOUNTER — Ambulatory Visit (HOSPITAL_COMMUNITY): Payer: Medicare PPO | Attending: Internal Medicine | Admitting: Radiology

## 2012-09-17 DIAGNOSIS — R5381 Other malaise: Secondary | ICD-10-CM | POA: Insufficient documentation

## 2012-09-17 DIAGNOSIS — I080 Rheumatic disorders of both mitral and aortic valves: Secondary | ICD-10-CM | POA: Insufficient documentation

## 2012-09-17 DIAGNOSIS — I4891 Unspecified atrial fibrillation: Secondary | ICD-10-CM | POA: Insufficient documentation

## 2012-09-17 DIAGNOSIS — I10 Essential (primary) hypertension: Secondary | ICD-10-CM | POA: Insufficient documentation

## 2012-09-17 DIAGNOSIS — I340 Nonrheumatic mitral (valve) insufficiency: Secondary | ICD-10-CM

## 2012-09-17 DIAGNOSIS — I509 Heart failure, unspecified: Secondary | ICD-10-CM | POA: Insufficient documentation

## 2012-09-17 LAB — BASIC METABOLIC PANEL
BUN: 15 mg/dL (ref 6–23)
CO2: 31 mEq/L (ref 19–32)
Calcium: 8.3 mg/dL — ABNORMAL LOW (ref 8.4–10.5)
GFR: 83.97 mL/min (ref >60.00)
Glucose, Bld: 90 mg/dL (ref 70–99)

## 2012-09-17 NOTE — Progress Notes (Signed)
Echocardiogram performed.  

## 2012-09-17 NOTE — Progress Notes (Signed)
Quick Note:  Please report to patient. The recent labs are stable. Continue same medication and careful diet. ______ 

## 2012-09-18 ENCOUNTER — Telehealth: Payer: Self-pay | Admitting: *Deleted

## 2012-09-18 ENCOUNTER — Encounter: Payer: Self-pay | Admitting: Physician Assistant

## 2012-09-18 NOTE — Telephone Encounter (Signed)
Advised patient of lab results  

## 2012-09-18 NOTE — Telephone Encounter (Signed)
Message copied by Burnell Blanks on Fri Sep 18, 2012  5:50 PM ------      Message from: Cassell Clement      Created: Thu Sep 17, 2012  9:08 PM       Please report to patient.  The recent labs are stable. Continue same medication and careful diet.

## 2012-09-21 ENCOUNTER — Telehealth: Payer: Self-pay | Admitting: *Deleted

## 2012-09-21 NOTE — Telephone Encounter (Signed)
Message copied by Tarri Fuller on Mon Sep 21, 2012 11:18 AM ------      Message from: VIA, PATRICIA M      Created: Fri Sep 18, 2012  4:16 PM       Will forward to nurse

## 2012-09-21 NOTE — Telephone Encounter (Signed)
pt notified about echo with verbal understanding; pt then c/o heart racing/pounding could hear in her ears when sho got up to the bathroom around 9 am, then again a few minutes ago, not racing/pounding when sitting still, will d/w PA

## 2012-09-21 NOTE — Telephone Encounter (Signed)
Message copied by Tarri Fuller on Mon Sep 21, 2012 11:07 AM ------      Message from: VIA, PATRICIA M      Created: Fri Sep 18, 2012  4:16 PM       Will forward to nurse

## 2012-09-24 ENCOUNTER — Ambulatory Visit (INDEPENDENT_AMBULATORY_CARE_PROVIDER_SITE_OTHER): Payer: Medicare PPO

## 2012-09-24 ENCOUNTER — Ambulatory Visit (INDEPENDENT_AMBULATORY_CARE_PROVIDER_SITE_OTHER): Payer: Medicare PPO | Admitting: *Deleted

## 2012-09-24 VITALS — BP 100/60 | HR 66 | Ht 60.0 in | Wt 140.8 lb

## 2012-09-24 DIAGNOSIS — I4891 Unspecified atrial fibrillation: Secondary | ICD-10-CM

## 2012-09-24 DIAGNOSIS — R079 Chest pain, unspecified: Secondary | ICD-10-CM

## 2012-09-24 DIAGNOSIS — R5381 Other malaise: Secondary | ICD-10-CM

## 2012-09-24 DIAGNOSIS — E7800 Pure hypercholesterolemia, unspecified: Secondary | ICD-10-CM

## 2012-09-24 DIAGNOSIS — I119 Hypertensive heart disease without heart failure: Secondary | ICD-10-CM

## 2012-09-24 DIAGNOSIS — E039 Hypothyroidism, unspecified: Secondary | ICD-10-CM

## 2012-09-24 DIAGNOSIS — R059 Cough, unspecified: Secondary | ICD-10-CM

## 2012-09-24 DIAGNOSIS — I059 Rheumatic mitral valve disease, unspecified: Secondary | ICD-10-CM

## 2012-09-24 DIAGNOSIS — R5383 Other fatigue: Secondary | ICD-10-CM

## 2012-09-24 DIAGNOSIS — R0609 Other forms of dyspnea: Secondary | ICD-10-CM

## 2012-09-24 DIAGNOSIS — Z7901 Long term (current) use of anticoagulants: Secondary | ICD-10-CM

## 2012-09-24 DIAGNOSIS — I340 Nonrheumatic mitral (valve) insufficiency: Secondary | ICD-10-CM

## 2012-09-24 DIAGNOSIS — R05 Cough: Secondary | ICD-10-CM

## 2012-09-24 LAB — FERRITIN: Ferritin: 25.9 ng/mL (ref 10.0–291.0)

## 2012-09-24 LAB — IRON: Iron: 72 ug/dL (ref 42–145)

## 2012-09-24 LAB — BASIC METABOLIC PANEL
CO2: 31 mEq/L (ref 19–32)
Calcium: 8 mg/dL — ABNORMAL LOW (ref 8.4–10.5)
Creatinine, Ser: 0.7 mg/dL (ref 0.4–1.2)
GFR: 88.32 mL/min (ref >60.00)
Sodium: 138 mEq/L (ref 135–145)

## 2012-09-24 LAB — CBC WITH DIFFERENTIAL/PLATELET
Basophils Absolute: 0 10*3/uL (ref 0.0–0.1)
Eosinophils Absolute: 0.2 10*3/uL (ref 0.0–0.7)
HCT: 41.9 % (ref 36.0–46.0)
Lymphs Abs: 1.8 10*3/uL (ref 0.7–4.0)
MCHC: 33.8 g/dL (ref 30.0–36.0)
Monocytes Absolute: 0.6 10*3/uL (ref 0.1–1.0)
Monocytes Relative: 10.4 % (ref 3.0–12.0)
Platelets: 250 10*3/uL (ref 150.0–400.0)
RDW: 14.5 % (ref 11.5–14.6)

## 2012-09-24 LAB — TSH: TSH: 1.76 u[IU]/mL (ref 0.35–5.50)

## 2012-09-24 NOTE — Progress Notes (Signed)
Patient had appointment for EKG.EKG was done and shown to DOD Dr.Klein.EKG revealed atrial fib rate 66 beats/min.

## 2012-09-25 ENCOUNTER — Telehealth: Payer: Self-pay | Admitting: *Deleted

## 2012-09-25 NOTE — Telephone Encounter (Signed)
pt notified about lab results with verbal understanding today.  

## 2012-09-25 NOTE — Telephone Encounter (Signed)
Message copied by Tarri Fuller on Fri Sep 25, 2012  4:12 PM ------      Message from: [place], [place] T      Created: Fri Sep 25, 2012  2:31 PM       Labs ok.      BNP up a little but improved when compared to previous.      Continue current dose of Lasix.      [name], PA-C  2:31 PM 09/25/2012

## 2012-10-05 ENCOUNTER — Ambulatory Visit (INDEPENDENT_AMBULATORY_CARE_PROVIDER_SITE_OTHER): Payer: Medicare PPO | Admitting: Pharmacist

## 2012-10-05 DIAGNOSIS — Z7901 Long term (current) use of anticoagulants: Secondary | ICD-10-CM

## 2012-10-05 DIAGNOSIS — I4891 Unspecified atrial fibrillation: Secondary | ICD-10-CM

## 2012-10-05 LAB — POCT INR: INR: 2.4

## 2012-10-14 ENCOUNTER — Telehealth: Payer: Self-pay | Admitting: Cardiology

## 2012-10-14 NOTE — Telephone Encounter (Signed)
Advised patient, to call if shortness of breath no better

## 2012-10-14 NOTE — Telephone Encounter (Signed)
New Prob   Pt is experiencing swelling and would like to know if she should increase one of her medications. Would like to speak to nurse.

## 2012-10-14 NOTE — Telephone Encounter (Signed)
Okay to double her lasix for the next two days to get rid of extra fluid.

## 2012-10-14 NOTE — Telephone Encounter (Signed)
Swelling in feet last few days but had been eating more salt lately. Decreased dietary salt intake and not really any better. Also c/o increase shortness of breath. Does sleep with her feet elevated. Takes Lasix 20 mg daily. Will forward to  Dr. Patty Sermons for review

## 2012-10-15 ENCOUNTER — Ambulatory Visit (INDEPENDENT_AMBULATORY_CARE_PROVIDER_SITE_OTHER): Payer: Medicare PPO | Admitting: *Deleted

## 2012-10-15 DIAGNOSIS — I4891 Unspecified atrial fibrillation: Secondary | ICD-10-CM

## 2012-10-15 DIAGNOSIS — Z7901 Long term (current) use of anticoagulants: Secondary | ICD-10-CM

## 2012-10-15 LAB — POCT INR: INR: 2.6

## 2012-11-04 ENCOUNTER — Encounter: Payer: Self-pay | Admitting: Cardiology

## 2012-11-04 ENCOUNTER — Ambulatory Visit (INDEPENDENT_AMBULATORY_CARE_PROVIDER_SITE_OTHER): Payer: Medicare PPO | Admitting: *Deleted

## 2012-11-04 ENCOUNTER — Ambulatory Visit (INDEPENDENT_AMBULATORY_CARE_PROVIDER_SITE_OTHER): Payer: Medicare PPO | Admitting: Cardiology

## 2012-11-04 VITALS — BP 120/68 | HR 62 | Ht 59.0 in | Wt 144.1 lb

## 2012-11-04 DIAGNOSIS — I119 Hypertensive heart disease without heart failure: Secondary | ICD-10-CM

## 2012-11-04 DIAGNOSIS — Z7901 Long term (current) use of anticoagulants: Secondary | ICD-10-CM

## 2012-11-04 DIAGNOSIS — I4891 Unspecified atrial fibrillation: Secondary | ICD-10-CM

## 2012-11-04 DIAGNOSIS — E039 Hypothyroidism, unspecified: Secondary | ICD-10-CM

## 2012-11-04 DIAGNOSIS — E7800 Pure hypercholesterolemia, unspecified: Secondary | ICD-10-CM

## 2012-11-04 LAB — LDL CHOLESTEROL, DIRECT: Direct LDL: 98.8 mg/dL

## 2012-11-04 LAB — BASIC METABOLIC PANEL
CO2: 33 mEq/L — ABNORMAL HIGH (ref 19–32)
Calcium: 8.7 mg/dL (ref 8.4–10.5)
Creatinine, Ser: 0.7 mg/dL (ref 0.4–1.2)
GFR: 85.35 mL/min (ref >60.00)
Sodium: 139 mEq/L (ref 135–145)

## 2012-11-04 LAB — LIPID PANEL
HDL: 66.8 mg/dL (ref >39.00)
Total CHOL/HDL Ratio: 3
Triglycerides: 201 mg/dL — ABNORMAL HIGH (ref 0.0–149.0)
VLDL: 40.2 mg/dL — ABNORMAL HIGH (ref 0.0–40.0)

## 2012-11-04 LAB — HEPATIC FUNCTION PANEL
Alkaline Phosphatase: 69 U/L (ref 39–117)
Bilirubin, Direct: 0.1 mg/dL (ref 0.0–0.3)
Total Bilirubin: 0.7 mg/dL (ref 0.3–1.2)
Total Protein: 7.2 g/dL (ref 6.0–8.3)

## 2012-11-04 LAB — POCT INR: INR: 1.8

## 2012-11-04 NOTE — Assessment & Plan Note (Signed)
The patient is in established permanent atrial fibrillation.  She is not having any TIA symptoms.  She is not having any problems from the warfarin

## 2012-11-04 NOTE — Assessment & Plan Note (Signed)
The patient is clinically euthyroid on current therapy 

## 2012-11-04 NOTE — Progress Notes (Signed)
Kristen Whitehead Date of Birth:  05/06/1923 [facility] [address] [place], [place]  [address] [phone]         Fax   [phone]  History of Present Illness: This pleasant 79 year old woman is seen for a scheduled followup office visit. She has a history of established atrial fibrillation. She does not have any history of ischemic heart disease. She had a normal Myoview stress test in January 2011. Kristen Whitehead saw her as a work in on 03/12/12 and she had another Myoview on 03/18/12 which showed no evidence of ischemia and her ejection fraction was 75%.  In January 2014 she was seen by Kristen Whitehead.  She subsequently had an update of her echocardiogram on 09/17/12 which showed an ejection fraction of 60-65% and biatrial enlargement.  There was mild mitral regurgitation. The patient has a history of compensated diastolic congestive heart failure. She has been on furosemide 20 mg daily.  Since last visit the patient has generally been doing well.  She will sometimes have a sensation of an elephant sitting on her chest with radiation to the left arm.  When this occurs she prays and rests quietly and the symptoms resolve.  Current Outpatient Prescriptions  Medication Sig Dispense Refill  . amLODipine (NORVASC) 5 MG tablet 1/2 tablet once a day      . atenolol (TENORMIN) 25 MG tablet Take 25 mg by mouth 2 (two) times daily.      . beta carotene w/minerals (OCUVITE) tablet Take 1 tablet by mouth daily.      . calcium carbonate (OS-CAL - DOSED IN MG OF ELEMENTAL CALCIUM) 1250 MG tablet Take 1 tablet by mouth daily.      . Cholecalciferol (VITAMIN D) 2000 UNITS tablet Take 2,000 Units by mouth daily.       Marland Kitchen estradiol (ESTRACE VAGINAL) 0.1 MG/GM vaginal cream Place 2 g vaginally 2 (two) times a week.       . furosemide (LASIX) 20 MG tablet Take 1 tablet (20 mg total) by mouth daily.  30 tablet  11  . isosorbide dinitrate (ISORDIL) 30 MG tablet Take 30 mg by mouth daily.       Marland Kitchen  levothyroxine (SYNTHROID, LEVOTHROID) 75 MCG tablet Take 75 mcg by mouth daily.        . nitroGLYCERIN (NITROSTAT) 0.4 MG SL tablet Place 1 tablet (0.4 mg total) under the tongue every 5 (five) minutes x 3 doses as needed. For chest pain.  25 tablet  5  . potassium chloride (K-DUR) 10 MEQ tablet Take 1 tablet (10 mEq total) by mouth daily.  30 tablet  11  . temazepam (RESTORIL) 15 MG capsule Take 15 mg by mouth at bedtime.      . travoprost, benzalkonium, (TRAVATAN) 0.004 % ophthalmic solution Place 1 drop into the left eye at bedtime.       . vitamin C (ASCORBIC ACID) 500 MG tablet Take 500 mg by mouth daily.      Marland Kitchen warfarin (COUMADIN) 5 MG tablet take as directed BY COUMADIN CLINIC  40 tablet  3  . potassium chloride SA (K-DUR,KLOR-CON) 20 MEQ tablet Take 20 mEq by mouth every Monday, Wednesday, and Friday.        No current facility-administered medications for this visit.    Allergies  Allergen Reactions  . Demerol   . Ebastine     EBS  . Ibuprofen   . Statins     myalgias  . Tetanus Toxoids   .  Tetracyclines & Related     Patient Active Problem List  Diagnosis  . Atrial fibrillation  . Benign hypertensive heart disease without heart failure  . Mitral regurgitation  . Hypothyroidism  . Hypercholesterolemia  . Fatigue  . Cough  . Chest pain at rest    History  Smoking status  . Never Smoker   Smokeless tobacco  . Not on file    History  Alcohol Use No    Family History  Problem Relation Age of Onset  . Heart attack Neg Hx   . Heart disease Neg Hx     Review of Systems: Constitutional: no fever chills diaphoresis or fatigue or change in weight.  Head and neck: no hearing loss, no epistaxis, no photophobia or visual disturbance. Respiratory: No cough, shortness of breath or wheezing. Cardiovascular: No chest pain peripheral edema, palpitations. Gastrointestinal: No abdominal distention, no abdominal pain, no change in bowel habits hematochezia or  melena. Genitourinary: No dysuria, no frequency, no urgency, no nocturia. Musculoskeletal:No arthralgias, no back pain, no gait disturbance or myalgias. Neurological: No dizziness, no headaches, no numbness, no seizures, no syncope, no weakness, no tremors. Hematologic: No lymphadenopathy, no easy bruising. Psychiatric: No confusion, no hallucinations, no sleep disturbance.    Physical Exam: Filed Vitals:   11/04/12 1427  BP: 120/68  Pulse: 62   the general appearance reveals a well-developed elderly woman in no distress.  She has gained 4 pounds since last visit.The head and neck exam reveals pupils equal and reactive.  Extraocular movements are full.  There is no scleral icterus.  The mouth and pharynx are normal.  The neck is supple.  The carotids reveal no bruits.  The jugular venous pressure is normal.  The  thyroid is not enlarged.  There is no lymphadenopathy.  The chest is clear to percussion and auscultation.  There are no rales or rhonchi.  Expansion of the chest is symmetrical.  The precordium is quiet.  The first heart sound is normal.  The second heart sound is physiologically split.  There is no  gallop rub or click.  There is a faint systolic murmur at the left sternal edge and apex There is no abnormal lift or heave.  The abdomen is soft and nontender.  The bowel sounds are normal.  The liver and spleen are not enlarged.  There are no abdominal masses.  There are no abdominal bruits.  Extremities reveal good pedal pulses.  There is no phlebitis or edema.  There is no cyanosis or clubbing.  Strength is normal and symmetrical in all extremities.  There is no lateralizing weakness.  There are no sensory deficits.  The skin is warm and dry.  There is no rash.     Assessment / Plan: The patient is to continue same medication.  We are checking blood work today including lipid panel hepatic function panel and basal metabolic panel.  She'll return in 2 months for followup office visit EKG  and basal metabolic panel

## 2012-11-04 NOTE — Patient Instructions (Signed)
Will obtain labs today and call you with the results (lp/bmet/hfp)  Your physician recommends that you continue on your current medications as directed. Please refer to the Current Medication list given to you today.  Your physician recommends that you schedule a follow-up appointment in: 2 month ov/bmet/ekg

## 2012-11-04 NOTE — Assessment & Plan Note (Signed)
Blood pressures remaining stable on current therapy. The patient recently had a [facility] screening exam which showed that she had good arterial pulses and good carotids and no evidence of abdominal aortic aneurysm.

## 2012-11-05 ENCOUNTER — Telehealth: Payer: Self-pay | Admitting: *Deleted

## 2012-11-05 NOTE — Progress Notes (Signed)
Quick Note:  Please report to patient. The recent labs are stable. Continue same medication and careful diet. LFTs are normal. TG are too high so avoid sweets and carbs as much as possible. ______

## 2012-11-05 NOTE — Telephone Encounter (Signed)
Message copied by Burnell Blanks on Thu Nov 05, 2012  8:59 AM ------      Message from: Cassell Clement      Created: Thu Nov 05, 2012  5:43 AM       Please report to patient.  The recent labs are stable. Continue same medication and careful diet. LFTs are normal. TG are too high so avoid sweets and carbs as much as possible. ------

## 2012-11-05 NOTE — Telephone Encounter (Signed)
Advised patient of lab results  

## 2012-11-16 ENCOUNTER — Other Ambulatory Visit: Payer: Self-pay | Admitting: *Deleted

## 2012-11-16 DIAGNOSIS — R0602 Shortness of breath: Secondary | ICD-10-CM

## 2012-11-16 MED ORDER — FUROSEMIDE 20 MG PO TABS
20.0000 mg | ORAL_TABLET | Freq: Every day | ORAL | Status: DC
Start: 2012-11-16 — End: 2012-11-18

## 2012-11-16 MED ORDER — ISOSORBIDE DINITRATE 30 MG PO TABS: 30.0000 mg | ORAL_TABLET | Freq: Every day | ORAL | Status: DC

## 2012-11-16 MED ORDER — AMLODIPINE BESYLATE 5 MG PO TABS: 2.5000 mg | ORAL_TABLET | Freq: Every day | ORAL | Status: DC

## 2012-11-16 MED ORDER — POTASSIUM CHLORIDE CRYS ER 20 MEQ PO TBCR: 20.0000 meq | EXTENDED_RELEASE_TABLET | ORAL | Status: DC

## 2012-11-16 MED ORDER — NITROGLYCERIN 0.4 MG SL SUBL: 0.4000 mg | SUBLINGUAL_TABLET | SUBLINGUAL | Status: DC | PRN

## 2012-11-16 MED ORDER — ATENOLOL 25 MG PO TABS: 25.0000 mg | ORAL_TABLET | Freq: Two times a day (BID) | ORAL | Status: DC

## 2012-11-18 ENCOUNTER — Telehealth: Payer: Self-pay | Admitting: Cardiology

## 2012-11-18 ENCOUNTER — Ambulatory Visit (INDEPENDENT_AMBULATORY_CARE_PROVIDER_SITE_OTHER): Payer: Medicare PPO | Admitting: *Deleted

## 2012-11-18 DIAGNOSIS — Z7901 Long term (current) use of anticoagulants: Secondary | ICD-10-CM

## 2012-11-18 DIAGNOSIS — I4891 Unspecified atrial fibrillation: Secondary | ICD-10-CM

## 2012-11-18 LAB — POCT INR: INR: 2.4

## 2012-11-18 NOTE — Telephone Encounter (Signed)
Increase Lasix to 40 mg daily and observe response

## 2012-11-18 NOTE — Telephone Encounter (Signed)
Will forward to coumadin clinic so it can be refilled

## 2012-11-18 NOTE — Telephone Encounter (Signed)
New problem    Per pt you need to notify [facility] of her warfarin prescription because they still didn't get it

## 2012-11-18 NOTE — Telephone Encounter (Signed)
Mostly in right foot, ankle. Does not go down at night. Sleeps with feet elevated. Does wear support stockings. Does not seem to leave and indention when she presses on it. Will forward to  Dr. Patty Sermons for review

## 2012-11-18 NOTE — Telephone Encounter (Signed)
Advised patient, will call back if no better 

## 2012-11-18 NOTE — Telephone Encounter (Signed)
New Prob   Pt states her ankles are swelling and would like to know if Dr. Patty Sermons would like her to increase medication. Would like to speak to nurse.

## 2012-11-19 MED ORDER — WARFARIN SODIUM 5 MG PO TABS: ORAL_TABLET | ORAL | Status: DC

## 2012-11-24 ENCOUNTER — Telehealth: Payer: Self-pay | Admitting: Cardiology

## 2012-12-01 MED ORDER — ISOSORBIDE MONONITRATE ER 30 MG PO TB24: 30.0000 mg | ORAL_TABLET | Freq: Every day | ORAL | Status: DC

## 2012-12-01 NOTE — Telephone Encounter (Signed)
Spoke with patient and she was requesting refill of Imdur (verified Isosorbide mon with patient) be sent to mail order pharmacy

## 2012-12-01 NOTE — Telephone Encounter (Signed)
New problem    Need to discuss RX clarification .

## 2012-12-16 ENCOUNTER — Ambulatory Visit (INDEPENDENT_AMBULATORY_CARE_PROVIDER_SITE_OTHER): Payer: Medicare PPO | Admitting: Pharmacist

## 2012-12-16 DIAGNOSIS — I4891 Unspecified atrial fibrillation: Secondary | ICD-10-CM

## 2012-12-16 DIAGNOSIS — Z7901 Long term (current) use of anticoagulants: Secondary | ICD-10-CM

## 2012-12-16 LAB — POCT INR: INR: 1.8

## 2012-12-30 ENCOUNTER — Telehealth: Payer: Self-pay | Admitting: Cardiology

## 2012-12-30 NOTE — Telephone Encounter (Signed)
New Prob      Pt is having some issues with her BP and would like to speak to nurse regarding this.

## 2012-12-30 NOTE — Telephone Encounter (Signed)
Advised patient

## 2012-12-30 NOTE — Telephone Encounter (Signed)
Increase atenolol to 25 mg 3 times a day

## 2012-12-30 NOTE — Telephone Encounter (Signed)
Yesterday no energy and felt "staggery". Blood pressure this am 163/86, sat down and rested 145. If she gets up and does anything her blood pressure goes up. Blood pressure now 134/77 heart rate 94 and she is just watching TV. Heart rate is 77-105. She did take a 1/2 of Norvasc about 10 minutes ago. States she does not seem to be thinking clearly and worn out. Will forward to  Dr. Patty Sermons for review

## 2013-01-13 ENCOUNTER — Telehealth: Payer: Self-pay | Admitting: Cardiology

## 2013-01-13 NOTE — Telephone Encounter (Signed)
New Problem  Pt states she noticed a bruise on her rt knee about a week ago. She says she has two new bruises on her right thigh. She states she has an appt on Friday with Dr Patty Sermons.  Pt states she is on Amoxicillin & Prednisone for Bronchitis.  She is wondering if that is affecting her medicine and could be causing the bruising.

## 2013-01-13 NOTE — Telephone Encounter (Signed)
Patient states 4  to 5 days ago she got 2 big bruce's on her leg at the beginning the bruce's were  brite red, now they are purple . Last night she got another bruce on her inner thigh and is purple. She notice that, because in such of short time she got three bruce's. pt wants to know if she needs to weight to come till Friday for the Coumadin clinic appointment.

## 2013-01-14 NOTE — Telephone Encounter (Signed)
New Prob      Pt would like to speak to nurse regarding some bruises she currently have. Wants to know if coumadin dosage should be adjusted. Please call.

## 2013-01-14 NOTE — Telephone Encounter (Signed)
Pt is being treated for bronchitis started on Amoxicillin and Prednisone taper 20mg  tablets 3x2, 2x2, 1x2, 1/2x2, has 2 tablets left.  Went back to [facility] on 01/07/13 abx changed to Levaquin 500mg  has 2 days left of abx.  Pt is coming in tomorrow to see Dr Patty Sermons.  Consulted with Weston Brass PharmD, advised pt both Levaquin and Prednisone can increase the effects of Coumadin.  Given increased bruising and appt scheduled for tomorrow instructed pt to hold Coumadin dosage today 1/2 tablet dosage and keep scheduled appt for tomorrow to check INR.

## 2013-01-15 ENCOUNTER — Ambulatory Visit (INDEPENDENT_AMBULATORY_CARE_PROVIDER_SITE_OTHER): Payer: Medicare PPO | Admitting: Cardiology

## 2013-01-15 ENCOUNTER — Ambulatory Visit (INDEPENDENT_AMBULATORY_CARE_PROVIDER_SITE_OTHER): Payer: Medicare PPO

## 2013-01-15 ENCOUNTER — Encounter: Payer: Self-pay | Admitting: Cardiology

## 2013-01-15 VITALS — BP 126/70 | HR 72 | Ht 59.0 in | Wt 145.4 lb

## 2013-01-15 DIAGNOSIS — E7800 Pure hypercholesterolemia, unspecified: Secondary | ICD-10-CM

## 2013-01-15 DIAGNOSIS — I340 Nonrheumatic mitral (valve) insufficiency: Secondary | ICD-10-CM

## 2013-01-15 DIAGNOSIS — I4891 Unspecified atrial fibrillation: Secondary | ICD-10-CM

## 2013-01-15 DIAGNOSIS — Z7901 Long term (current) use of anticoagulants: Secondary | ICD-10-CM

## 2013-01-15 DIAGNOSIS — I059 Rheumatic mitral valve disease, unspecified: Secondary | ICD-10-CM

## 2013-01-15 NOTE — Assessment & Plan Note (Signed)
She has a history of hypercholesterolemia.  She is not on statin therapy.  She has had a lot of leg and foot and hand cramps. She is using natural foods to try to bring her cholesterol down

## 2013-01-15 NOTE — Progress Notes (Signed)
Kristen Whitehead Date of Birth:  1922-09-22 [facility] [address] [place], [place]  [address] [phone]         Fax   [phone]  History of Present Illness:  This pleasant 79 year old woman is seen for a scheduled followup office visit. She has a history of established atrial fibrillation. She does not have any history of ischemic heart disease. She had a normal Myoview stress test in January 2011. Kristen Whitehead saw her as a work in on 03/12/12 and she had another Myoview on 03/18/12 which showed no evidence of ischemia and her ejection fraction was 75%. In January 2014 she was seen by Kristen Whitehead. She subsequently had an update of her echocardiogram on 09/17/12 which showed an ejection fraction of 60-65% and biatrial enlargement. There was mild mitral regurgitation.  The patient has a history of compensated diastolic congestive heart failure. She has been on furosemide 20 mg daily. Since last visit the patient has generally been doing well. She will sometimes have a sensation of an elephant sitting on her chest with radiation to the left arm. When this occurs she prays and rests quietly and the symptoms resolve.  Since last visit she had a episode of bronchitis for which she was given Levaquin with good results.  Current Outpatient Prescriptions  Medication Sig Dispense Refill  . amLODipine (NORVASC) 5 MG tablet Take 0.5 tablets (2.5 mg total) by mouth daily. 1/2 tablet once a day  45 tablet  3  . atenolol (TENORMIN) 25 MG tablet Take 25 mg by mouth as directed. 2 to 3 times a day      . beta carotene w/minerals (OCUVITE) tablet Take 1 tablet by mouth daily.      . calcium carbonate (OS-CAL - DOSED IN MG OF ELEMENTAL CALCIUM) 1250 MG tablet Take 1 tablet by mouth daily.      . Cholecalciferol (VITAMIN D) 2000 UNITS tablet Take 2,000 Units by mouth daily.       Marland Kitchen estradiol (ESTRACE VAGINAL) 0.1 MG/GM vaginal cream Place 2 g vaginally 2 (two) times a week.       .  furosemide (LASIX) 20 MG tablet Take 40 mg by mouth daily.      . isosorbide mononitrate (IMDUR) 30 MG 24 hr tablet Take 1 tablet (30 mg total) by mouth daily.  90 tablet  3  . levothyroxine (SYNTHROID, LEVOTHROID) 75 MCG tablet Take 75 mcg by mouth daily.        . nitroGLYCERIN (NITROSTAT) 0.4 MG SL tablet Place 1 tablet (0.4 mg total) under the tongue every 5 (five) minutes x 3 doses as needed. For chest pain.  25 tablet  5  . potassium chloride (K-DUR) 10 MEQ tablet Take 1 tablet (10 mEq total) by mouth daily.  30 tablet  11  . potassium chloride SA (K-DUR,KLOR-CON) 20 MEQ tablet Take 1 tablet (20 mEq total) by mouth every Monday, Wednesday, and Friday.  40 tablet  3  . temazepam (RESTORIL) 15 MG capsule Take 15 mg by mouth at bedtime.      . travoprost, benzalkonium, (TRAVATAN) 0.004 % ophthalmic solution Place 1 drop into the left eye at bedtime.       . vitamin C (ASCORBIC ACID) 500 MG tablet Take 500 mg by mouth daily.      Marland Kitchen warfarin (COUMADIN) 5 MG tablet Take as directed by anticoagulation clinic  90 tablet  1   No current facility-administered medications for this visit.    Allergies  Allergen Reactions  . Demerol   . Ebastine     EBS  . Ibuprofen   . Statins     myalgias  . Tetanus Toxoids   . Tetracyclines & Related     Patient Active Problem List   Diagnosis Date Noted  . Chest pain at rest 03/12/2012  . Cough 09/16/2011  . Fatigue 04/08/2011  . Benign hypertensive heart disease without heart failure 02/11/2011  . Mitral regurgitation 02/11/2011  . Hypothyroidism 02/11/2011  . Hypercholesterolemia 02/11/2011  . Atrial fibrillation 12/03/2010    History  Smoking status  . Never Smoker   Smokeless tobacco  . Not on file    History  Alcohol Use No    Family History  Problem Relation Age of Onset  . Heart attack Neg Hx   . Heart disease Neg Hx     Review of Systems: Constitutional: no fever chills diaphoresis or fatigue or change in weight.  Head and  neck: no hearing loss, no epistaxis, no photophobia or visual disturbance. Respiratory: No cough, shortness of breath or wheezing. Cardiovascular: No chest pain peripheral edema, palpitations. Gastrointestinal: No abdominal distention, no abdominal pain, no change in bowel habits hematochezia or melena. Genitourinary: No dysuria, no frequency, no urgency, no nocturia. Musculoskeletal:No arthralgias, no back pain, no gait disturbance or myalgias. Neurological: No dizziness, no headaches, no numbness, no seizures, no syncope, no weakness, no tremors. Hematologic: No lymphadenopathy, no easy bruising. Psychiatric: No confusion, no hallucinations, no sleep disturbance.    Physical Exam: Filed Vitals:   01/15/13 1338  BP: 126/70  Pulse: 72   the general appearance reveals a well-developed well-nourished woman in no distress.  Rhythm is irregularly irregular.The head and neck exam reveals pupils equal and reactive.  Extraocular movements are full.  There is no scleral icterus.  The mouth and pharynx are normal.  The neck is supple.  The carotids reveal no bruits.  The jugular venous pressure is normal.  The  thyroid is not enlarged.  There is no lymphadenopathy.  The chest is clear to percussion and auscultation.  There are no rales or rhonchi.  Expansion of the chest is symmetrical.  The precordium is quiet.  The first heart sound is normal.  The second heart sound is physiologically split.  There is no murmur gallop rub or click.  There is no abnormal lift or heave.  The abdomen is soft and nontender.  The bowel sounds are normal.  The liver and spleen are not enlarged.  There are no abdominal masses.  There are no abdominal bruits.  Extremities reveal good pedal pulses.  There is no phlebitis or edema.  There is no cyanosis or clubbing.  Strength is normal and symmetrical in all extremities.  There is no lateralizing weakness.  There are no sensory deficits.  The skin is warm and dry.  There is no  rash.  EKG shows atrial fibrillation with controlled ventricular response and no ischemic changes   Assessment / Plan: Continue same medication.  Recheck in 3 months for followup office visit.

## 2013-01-15 NOTE — Assessment & Plan Note (Signed)
The patient is not having any symptoms of congestive heart failure.  She's not having any paroxysmal nocturnal dyspnea.

## 2013-01-15 NOTE — Patient Instructions (Addendum)
Your physician recommends that you continue on your current medications as directed. Please refer to the Current Medication list given to you today.  Your physician recommends that you schedule a follow-up appointment in: 3 months  

## 2013-01-15 NOTE — Assessment & Plan Note (Signed)
The patient has established atrial fibrillation.  She is on long-term Coumadin.  She has not been having any TIA symptoms.

## 2013-01-21 ENCOUNTER — Telehealth: Payer: Self-pay | Admitting: Cardiology

## 2013-01-21 NOTE — Telephone Encounter (Signed)
New Problem  Pt states she has a bad bruise on her right hand. She was advised to call when she gets a bruise due to her medication.

## 2013-01-21 NOTE — Telephone Encounter (Signed)
Spoke wit patient and she is not aware of any injury to hand. Scheduled INR for tomorrow, patient aware

## 2013-01-22 ENCOUNTER — Ambulatory Visit (INDEPENDENT_AMBULATORY_CARE_PROVIDER_SITE_OTHER): Payer: Medicare PPO | Admitting: *Deleted

## 2013-01-22 DIAGNOSIS — I4891 Unspecified atrial fibrillation: Secondary | ICD-10-CM

## 2013-01-22 DIAGNOSIS — Z7901 Long term (current) use of anticoagulants: Secondary | ICD-10-CM

## 2013-01-25 ENCOUNTER — Telehealth: Payer: Self-pay | Admitting: Cardiology

## 2013-01-25 NOTE — Telephone Encounter (Signed)
Will also use NTG as needed for chest pains

## 2013-01-25 NOTE — Telephone Encounter (Signed)
Advised patient. Will call back if swelling continues

## 2013-01-25 NOTE — Telephone Encounter (Signed)
New Problem:    Patient called in because her right foot is swelling, her right hand has a bruise and she is having pain in her left chest.  Patient statiend that she is not going to the hospital and would like to speak with you.  Please call back.

## 2013-01-25 NOTE — Telephone Encounter (Signed)
Continue current medication and get extra rest

## 2013-01-25 NOTE — Telephone Encounter (Signed)
Had coumadin check on Friday for her right hand being bruised, states it is sore denies injury.  Patient having swelling in right foot, took lasix 40 mg yesterday (takes 20 mg daily), not sure if that helped or if keeping them elevated helped. Last night around 2:30 am had a sharp pain in chest, laid down on her right side. Woke up about 4 hours later and continued to have pain. Since up and moving around pain has subsided. Patient didn't use any NTG. Did take her blood pressure and it was ok. Will forward to  Dr. Patty Sermons for review

## 2013-02-09 ENCOUNTER — Other Ambulatory Visit: Payer: Self-pay | Admitting: *Deleted

## 2013-02-09 MED ORDER — ATENOLOL 25 MG PO TABS: 25.0000 mg | ORAL_TABLET | ORAL | Status: DC

## 2013-02-12 ENCOUNTER — Ambulatory Visit (INDEPENDENT_AMBULATORY_CARE_PROVIDER_SITE_OTHER): Payer: Medicare PPO

## 2013-02-12 DIAGNOSIS — Z7901 Long term (current) use of anticoagulants: Secondary | ICD-10-CM

## 2013-02-12 DIAGNOSIS — I4891 Unspecified atrial fibrillation: Secondary | ICD-10-CM

## 2013-02-17 ENCOUNTER — Ambulatory Visit (INDEPENDENT_AMBULATORY_CARE_PROVIDER_SITE_OTHER): Payer: Medicare PPO | Admitting: Neurology

## 2013-02-17 ENCOUNTER — Encounter: Payer: Self-pay | Admitting: Neurology

## 2013-02-17 VITALS — BP 106/65 | HR 68 | Temp 97.1°F | Ht 59.0 in | Wt 140.0 lb

## 2013-02-17 DIAGNOSIS — G609 Hereditary and idiopathic neuropathy, unspecified: Secondary | ICD-10-CM

## 2013-02-17 DIAGNOSIS — G2581 Restless legs syndrome: Secondary | ICD-10-CM

## 2013-02-17 MED ORDER — PRAMIPEXOLE DIHYDROCHLORIDE 0.125 MG PO TABS: ORAL_TABLET | ORAL | Status: DC

## 2013-02-17 NOTE — Progress Notes (Signed)
Subjective:    Patient ID: Kristen Whitehead is a 79 y.o. female.  HPI  Huston Foley, MD, PhD [facility] [address] [address] [place], [place] [address]  Dear Dr. Clarene Duke, I saw your patient, Kristen Whitehead, upon your kind request in my neurologic clinic today for initial consultation of her foot pain, concern for neuropathy. The patient is accompanied by her friend, [place], today. As you know, Ms. Pompa is a very pleasant 79 year old right-handed woman with an underlying medical history of hypertension, hyperlipidemia, atrial fibrillation, celiac disease, scoliosis, reflux disease, who has been experiencing bilateral foot pain particularly in her salt 4 months or possibly for years. She has seen a podiatrist in the past 2 has done nerve biopsy which reportedly was normal. She has had swelling of her feet for the past few months. She does have a stricture fibrillation as well as compensated tach systolic heart failure and is followed by Dr. Patty Sermons. Echocardiogram in January of this year showed biatrial enlargement and mild mitral regurgitation and an EF of 60-65% with diastolic dysfunction. She is currently on nitroglycerin as needed, atenolol 25 mg 3 times a day, vitamin D, Coumadin, Klor-Con, Lasix 20 mg daily, isosorbide dinitrate 30 mg twice daily, Travatan, magnesium, fish oil, calcium, Estrace, Norvasc 2.5 mg, Synthroid 88 mcg, temazepam 15 mg as needed for sleep. For years she has had burning in her feet and some months ago she started having a crawling sensation in her distal legs. She has also had cramping in her muscles for years and she was taken off of her statin about a year ago and her cramping has lately been somewhat better. She has had pins and needles sensation and it helps to walk around. She has worse symptoms at night or by the end of the day.  She remembers having "terrible growing pains" as a child and does not have a FHx of RLS  as far as she knows. She had nerve biopsies both legs by her podiatrist, which did not show inflammation.  While she has a history of chronic back pain she denies any radiating back pain to her legs. In fact she feels that her pins and needle and crawling sensation starts in the distal lower extremities in works itself up.   Her Past Medical History Is Significant For: Past Medical History  Diagnosis Date  . History of mitral valve prolapse 06/15/2008    a. echo 1/14: mild LVH, EF 60-65%, mild MR, mild to mod BAE, PASP 35  . Chronic atrial fibrillation     managed with rate control and coumadin  . History of congenital mitral regurgitation     mild  . Hypothyroidism   . Hypercholesterolemia   . GERD (gastroesophageal reflux disease)   . Chest pain     no known ischemic heart disease; negative Myoview July 2013. EF 75% with no ischemia.   . Hypertension   . Bladder prolapse, female, acquired   . Chronic diastolic heart failure   . Chronic anticoagulation   . Heart disease     Her Past Surgical History Is Significant For: Past Surgical History  Procedure Laterality Date  . Thyroidectomy    . Cholecystectomy    . Abdominal hysterectomy      Her Family History Is Significant For: Family History  Problem Relation Age of Onset  . Heart attack Neg Hx   . Heart disease Neg Hx   . Stroke Mother   . Stroke Father  Her Social History Is Significant For: History   Social History  . Marital Status: Divorced    Spouse Name: N/A    Number of Children: 3  . Years of Education: BA   Occupational History  .     Social History Main Topics  . Smoking status: Never Smoker   . Smokeless tobacco: Never Used  . Alcohol Use: No  . Drug Use: No  . Sexually Active: No   Other Topics Concern  . None   Social History Narrative   Pt lives at home alone.   Caffeine Use: quit 76yrs ago    Her Allergies Are:  Allergies  Allergen Reactions  . Demerol   . Ebastine     EBS   . Ibuprofen   . Statins     myalgias  . Tetanus Toxoids   . Tetracyclines & Related   :   Her Current Medications Are:  Outpatient Encounter Prescriptions as of 02/17/2013  Medication Sig Dispense Refill  . amLODipine (NORVASC) 5 MG tablet Take 0.5 tablets (2.5 mg total) by mouth daily. 1/2 tablet once a day  45 tablet  3  . atenolol (TENORMIN) 25 MG tablet Take 1 tablet (25 mg total) by mouth as directed. 2 to 3 times a day  60 tablet  3  . beta carotene w/minerals (OCUVITE) tablet Take 1 tablet by mouth daily.      . calcium carbonate (OS-CAL - DOSED IN MG OF ELEMENTAL CALCIUM) 1250 MG tablet Take 1 tablet by mouth daily.      . Cholecalciferol (VITAMIN D) 2000 UNITS tablet Take 2,000 Units by mouth daily.       Marland Kitchen estradiol (ESTRACE VAGINAL) 0.1 MG/GM vaginal cream Place 2 g vaginally 2 (two) times a week.       . furosemide (LASIX) 20 MG tablet Take 40 mg by mouth daily.      . isosorbide mononitrate (IMDUR) 30 MG 24 hr tablet Take 1 tablet (30 mg total) by mouth daily.  90 tablet  3  . levothyroxine (SYNTHROID, LEVOTHROID) 75 MCG tablet Take 75 mcg by mouth daily.        . nitroGLYCERIN (NITROSTAT) 0.4 MG SL tablet Place 1 tablet (0.4 mg total) under the tongue every 5 (five) minutes x 3 doses as needed. For chest pain.  25 tablet  5  . potassium chloride (K-DUR) 10 MEQ tablet Take 1 tablet (10 mEq total) by mouth daily.  30 tablet  11  . pramipexole (MIRAPEX) 0.125 MG tablet 1 pill each night for 1 week, the 2 pills each night thereafter.  60 tablet  5  . temazepam (RESTORIL) 15 MG capsule Take 15 mg by mouth at bedtime.      . travoprost, benzalkonium, (TRAVATAN) 0.004 % ophthalmic solution Place 1 drop into the left eye at bedtime.       . vitamin C (ASCORBIC ACID) 500 MG tablet Take 500 mg by mouth daily.      Marland Kitchen warfarin (COUMADIN) 5 MG tablet Take as directed by anticoagulation clinic  90 tablet  1  . [DISCONTINUED] potassium chloride SA (K-DUR,KLOR-CON) 20 MEQ tablet Take 1 tablet  (20 mEq total) by mouth every Monday, Wednesday, and Friday.  40 tablet  3   No facility-administered encounter medications on file as of 02/17/2013.   Review of Systems  Constitutional: Positive for fatigue and unexpected weight change (weight gain).  HENT: Positive for trouble swallowing and tinnitus.   Eyes:  Blurred vision  Respiratory: Positive for cough and shortness of breath.   Cardiovascular: Positive for chest pain and leg swelling.  Gastrointestinal:       Incontinence  Endocrine: Positive for cold intolerance, heat intolerance and polydipsia.  Genitourinary:       Incontinence  Musculoskeletal:       Cramps  Neurological: Positive for tremors, weakness and numbness.       Difficulty swallowing  Hematological: Bruises/bleeds easily.  Psychiatric/Behavioral: Positive for sleep disturbance (insomnia).       Decreased energy    Objective:  Neurologic Exam  Physical Exam Physical Examination:   Filed Vitals:   02/17/13 1328  BP: 106/65  Pulse: 68  Temp: 97.1 F (36.2 C)    General Examination: The patient is a very pleasant 79 y.o. female in no acute distress. She appears well-developed and well-nourished and well groomed.   HEENT: Normocephalic, atraumatic, pupils are equal, round and reactive to light and accommodation. Extraocular tracking is good without limitation to gaze excursion or nystagmus noted. Normal smooth pursuit is noted. Hearing is grossly intact. Tympanic membranes are clear bilaterally. Face is symmetric with normal facial animation and normal facial sensation. Speech is clear with no dysarthria noted. There is no hypophonia. There is no lip, neck/head, jaw or voice tremor. Neck is supple with full range of passive and active motion. There are no carotid bruits on auscultation. Oropharynx exam reveals: mild mouth dryness, adequate dental hygiene and no airway crowding. Tongue protrudes centrally and palate elevates symmetrically.    Chest: Clear  to auscultation without wheezing, rhonchi or crackles noted.  Heart: S1+S2+0, regular and normal without murmurs, rubs or gallops noted.   Abdomen: Soft, non-tender and non-distended with normal bowel sounds appreciated on auscultation.  Extremities: There is 1+ pitting edema in the distal lower extremities bilaterally. Pedal pulses are intact.  Skin: Warm and dry without trophic changes noted. There are no varicose veins.  Musculoskeletal: exam reveals no obvious joint deformities, tenderness or joint swelling or erythema.   Neurologically:  Mental status: The patient is awake, alert and oriented in all 4 spheres. Her memory, attention, language and knowledge are appropriate. There is no aphasia, agnosia, apraxia or anomia. Speech is clear with normal prosody and enunciation. Thought process is linear. Mood is congruent and affect is normal.  Cranial nerves are as described above under HEENT exam. In addition, shoulder shrug is normal with equal shoulder height noted. Motor exam: Normal bulk, strength and tone is noted. There is no drift, tremor or rebound. Romberg is negative. Reflexes are 1+ in the UEs and trace in the knees and absent in both ankles. Toes are downgoing bilaterally. Fine motor skills are intact with normal finger taps, normal hand movements, normal rapid alternating patting, normal foot taps and normal foot agility.  Cerebellar testing shows no dysmetria or intention tremor on finger to nose testing. Heel to shin is unremarkable bilaterally. There is no truncal or gait ataxia.  Sensory exam is intact to light touch, pinprick, vibration, temperature sense in the upper extremities, but decreased to PP and temp and to a lesser degree to vibration sense in the distal LEs.  Gait, station and balance:  are unremarkable. No veering to one side is noted. No leaning to one side is noted. Posture is age-appropriate and stance is narrow based. No problems turning are noted. She turns in 3  steps. Tandem walk is unremarkable. Intact toe and heel stance is noted.  Assessment and Plan:   In summary, KATERINA ZURN is a very pleasant 79 y.o.-year old female with a history of leg and foot pain. Her physical exam is consistent with mild neuropathy in the absence of a Hx of DM with normal HBA1c recently and normal B12. I do believe, there is a component of restless leg syndrome as well.  I had a long chat with the patient and her friend about my findings and the diagnosis of neuropathy and RLS, the prognosis and treatment options. We talked about medical treatments and non-pharmacological approaches.  As far as further diagnostic testing is concerned, I suggested the following today: EMG and nerve conduction test her lower extremities, blood work. As far symptomatic treatment: At this juncture I would like to try her on a small dose of Mirapex for restless leg syndrome type symptoms. This will be 0.125 mg strength at night only. She will start with one pill for one week and then increase it to 2 pills. I did talk to her about common side effects with this medication and what to look out for. Since she has trouble sleeping at night it may help her sleep as well. We will be calling her with the test results. I would like to see her back in 3 months from now, sooner if the need arises. She is encouraged to call with any interim questions, concerns, problems or updates and refill requests and test results.   Thank you very much for allowing me to participate in the care of this nice patient. If I can be of any further assistance to you please do not hesitate to call me at [phone].  Sincerely,   Huston Foley, MD, PhD

## 2013-02-17 NOTE — Patient Instructions (Addendum)
I think overall you are doing fairly well but I do want to suggest a few things today:  Remember to drink plenty of fluid, eat healthy meals and do not skip any meals. Try to eat protein with a every meal and eat a healthy snack such as fruit or nuts in between meals. Try to keep a regular sleep-wake schedule and try to exercise daily, particularly in the form of walking, 20-30 minutes a day, if you can.   Engage in social activities in your community and with your family and try to keep up with current events by reading the newspaper or watching the news.   As far as your medications are concerned, I would like to suggest: Mirapex 0.125 mg: Take 1 pill each night for 1 week, the 2 pills each night thereafter.    As far as diagnostic testing: nerve and muscle electrical testing. Blood work today.  I would like to see you back in 3 months, sooner if we need to. Please call us with any interim questions, concerns, problems, updates or refill requests.  Please also call us for any test results so we can go over those with you on the phone. Brett Canales is my clinical assistant and will answer any of your questions and relay your messages to me and also relay most of my messages to you.  Our phone number is [phone]. We also have an after hours call service for urgent matters and there is a physician on-call for urgent questions. For any emergencies you know to call 911 or go to the nearest emergency room.

## 2013-02-22 LAB — IFE AND PE, SERUM
Albumin SerPl Elph-Mcnc: 3.8 g/dL (ref 3.2–5.6)
Alpha 1: 0.2 g/dL (ref 0.1–0.4)
B-Globulin SerPl Elph-Mcnc: 1 g/dL (ref 0.6–1.3)
Gamma Glob SerPl Elph-Mcnc: 1 g/dL (ref 0.5–1.6)
IgA/Immunoglobulin A, Serum: 222 mg/dL (ref 91–414)

## 2013-02-22 LAB — CBC WITH DIFFERENTIAL
Basophils Absolute: 0 10*3/uL (ref 0.0–0.2)
Eosinophils Absolute: 0.1 10*3/uL (ref 0.0–0.4)
Immature Grans (Abs): 0 10*3/uL (ref 0.0–0.1)
Lymphs: 35 % (ref 14–46)
MCHC: 33.6 g/dL (ref 31.5–35.7)
Neutrophils Relative %: 50 % (ref 40–74)
RDW: 15.1 % (ref 12.3–15.4)

## 2013-02-22 LAB — ANA W/REFLEX: Anti Nuclear Antibody(ANA): NEGATIVE

## 2013-02-22 LAB — CK TOTAL AND CKMB (NOT AT ARMC)
CK-MB Index: 3.2 ng/mL — ABNORMAL HIGH (ref 0.0–2.9)
Total CK: 99 U/L (ref 24–173)

## 2013-02-22 NOTE — Progress Notes (Signed)
Quick Note:  Please call patient and advise her that her blood work was for the most part unremarkable. We checked for inflammatory markers and muscle enzymes. Her cardiac muscle enzyme is borderline elevated but unless she has any shortness of breath or history of chest pain there is nothing we need to do further or differently at this time. Please inquire and related to patient, thanks Huston Foley, MD, PhD [facility] ([facility]) ______

## 2013-02-24 ENCOUNTER — Ambulatory Visit (INDEPENDENT_AMBULATORY_CARE_PROVIDER_SITE_OTHER): Payer: Medicare PPO | Admitting: *Deleted

## 2013-02-24 DIAGNOSIS — Z7901 Long term (current) use of anticoagulants: Secondary | ICD-10-CM

## 2013-02-24 DIAGNOSIS — I4891 Unspecified atrial fibrillation: Secondary | ICD-10-CM

## 2013-02-24 NOTE — Progress Notes (Signed)
Quick Note:  Spoke with patient and relayed results of blood work. The pt did state that she has recently been diagnosed with Diastolic heart failure and that she does occasionally have shortness of breath.   ______

## 2013-03-03 ENCOUNTER — Ambulatory Visit (INDEPENDENT_AMBULATORY_CARE_PROVIDER_SITE_OTHER): Payer: Medicare PPO | Admitting: Neurology

## 2013-03-03 ENCOUNTER — Encounter (INDEPENDENT_AMBULATORY_CARE_PROVIDER_SITE_OTHER): Payer: Medicare PPO | Admitting: Radiology

## 2013-03-03 DIAGNOSIS — Z0289 Encounter for other administrative examinations: Secondary | ICD-10-CM

## 2013-03-03 DIAGNOSIS — G2581 Restless legs syndrome: Secondary | ICD-10-CM

## 2013-03-03 DIAGNOSIS — G544 Lumbosacral root disorders, not elsewhere classified: Secondary | ICD-10-CM

## 2013-03-03 DIAGNOSIS — G609 Hereditary and idiopathic neuropathy, unspecified: Secondary | ICD-10-CM

## 2013-03-03 NOTE — Procedures (Signed)
HISTORY:  Kristen Whitehead is an 79 year old patient with a history of lumbosacral spondylosis and scoliosis. The patient has chronic low back pain, and she has also had chronic discomfort in the feet with occasional muscle cramps. The patient is being evaluated for a possible neuropathy or a lumbosacral radiculopathy.  NERVE CONDUCTION STUDIES:  Nerve conduction studies were performed on the left upper extremity. The distal motor latencies and motor amplitudes for the median and ulnar nerves were within normal limits. The F wave latencies and nerve conduction velocities for these nerves were also normal. The sensory latencies for the median and ulnar nerves were normal.  Nerve conduction studies were performed on both lower extremities. The distal motor latencies and motor amplitudes for the peroneal and posterior tibial nerves were within normal limits. The nerve conduction velocities for these nerves were also normal. The H reflex latencies were normal. The sensory latencies for the peroneal nerves were within normal limits on the right, absent on the left.    EMG STUDIES:  EMG study was performed on the left lower extremity:  The tibialis anterior muscle reveals 2 to 4K motor units with full recruitment. No fibrillations or positive waves were seen. The peroneus tertius muscle reveals 2 to 5K motor units with decreased recruitment. No fibrillations or positive waves were seen. The medial gastrocnemius muscle reveals 2 to 6K motor units with decreased recruitment. No fibrillations or positive waves were seen. The vastus lateralis muscle reveals 2 to 4K motor units with full recruitment. No fibrillations or positive waves were seen. The iliopsoas muscle reveals 2 to 4K motor units with full recruitment. No fibrillations or positive waves were seen. The biceps femoris muscle (long head) reveals 2 to 4K motor units with full recruitment. No fibrillations or positive waves were seen. Complex  repetitive discharges were seen The lumbosacral paraspinal muscles were tested at 3 levels, and revealed 1+ fibrillations and positive waves at all 3 levels tested. Complex repetitive discharges were seen at the middle level. There was good relaxation.  EMG study was performed on the right lower extremity:  The tibialis anterior muscle reveals 2 to 6K motor units with decreased recruitment. 3+ fibrillations and positive waves were seen. The peroneus tertius muscle reveals 2 to 5K motor units with decreased recruitment. No fibrillations or positive waves were seen. The medial gastrocnemius muscle reveals 1 to 3K motor units with full recruitment. One plus fibrillations and positive waves were seen. The vastus lateralis muscle reveals 2 to 4K motor units with full recruitment. No fibrillations or positive waves were seen. The iliopsoas muscle reveals 2 to 4K motor units with full recruitment. No fibrillations or positive waves were seen. The biceps femoris muscle (long head) reveals 2 to 4K motor units with full recruitment. No fibrillations or positive waves were seen. Complex repetitive discharges were seen. The lumbosacral paraspinal muscles were tested at 3 levels, and revealed 1+ fibrillations and positive waves at the upper and middle levels tested. No abnormalities of insertional activity were seen at the lower level. There was good relaxation.   IMPRESSION:  Nerve conduction studies done on both lower extremities and on the left upper extremity were relatively unremarkable, without clear evidence of a peripheral neuropathy. EMG evaluation of the left lower extremity shows findings consistent with a primarily chronic, low-grade S1 radiculopathy. EMG evaluation of the right lower extremity again shows a primarily chronic, low-grade S1 radiculopathy with some features of a mild acute L5 radiculopathy as well. No other significant abnormalities were seen.  Marlan Palau MD 03/03/2013 4:45  PM  [facility] [address] [place], [place] [address]  Phone [phone] Fax [phone]

## 2013-03-09 ENCOUNTER — Telehealth: Payer: Self-pay | Admitting: Cardiology

## 2013-03-09 NOTE — Telephone Encounter (Signed)
Will forward to the [facility] for review of frequency of need and to see if they can do a letter for her .

## 2013-03-09 NOTE — Telephone Encounter (Signed)
New prob  Pt needs a letter for her to send to [facility] because they are refusing to pay for her coumadin checks.  She said they need a letter how frequently she is suppose to have them. Pt will be bring the letter from [facility] when she comes in for her coumadin check on tom.

## 2013-03-09 NOTE — Progress Notes (Signed)
Quick Note:  Please advise patient that the EMG and nerve conduction study which is the nerve and muscle electrical testing showed no significant abnormalities in terms of nerve damage. There is mild evidence of a staple or chronic radiculopathy meaning compression of nerve roots coming from the back. This may cause symptoms such as radiating back pain and chronic low back pain. Huston Foley, MD, PhD [facility]  ______

## 2013-03-10 ENCOUNTER — Ambulatory Visit (INDEPENDENT_AMBULATORY_CARE_PROVIDER_SITE_OTHER): Payer: Medicare PPO | Admitting: *Deleted

## 2013-03-10 DIAGNOSIS — Z7901 Long term (current) use of anticoagulants: Secondary | ICD-10-CM

## 2013-03-10 DIAGNOSIS — I4891 Unspecified atrial fibrillation: Secondary | ICD-10-CM

## 2013-03-11 NOTE — Progress Notes (Signed)
Quick Note:  I called pt and relayed the results as per below. Will fax results and ofv note to Dr. Catha Gosselin, pcp. ______

## 2013-03-11 NOTE — Telephone Encounter (Signed)
Pt brought in EOB from [facility] yesterday.  OV from 7/9 was denied.  Spoke with [facility]; they state it was denied because Dr. Johney Frame (DOD for that day) is listed as out-of-network.  This has not been the case with any other physicians within the practice.  Unsure why only affects him.  Have left message with Cydney Ok to help.  Pt aware we are working on the situation.

## 2013-03-11 NOTE — Telephone Encounter (Signed)
Follow up  Pt would to speak with you regarding her [employer] and some referrals.

## 2013-03-18 ENCOUNTER — Other Ambulatory Visit: Payer: Self-pay

## 2013-03-18 DIAGNOSIS — Z1231 Encounter for screening mammogram for malignant neoplasm of breast: Secondary | ICD-10-CM

## 2013-03-19 ENCOUNTER — Other Ambulatory Visit: Payer: Self-pay

## 2013-03-21 ENCOUNTER — Other Ambulatory Visit: Payer: Self-pay | Admitting: Family Medicine

## 2013-03-21 ENCOUNTER — Ambulatory Visit (INDEPENDENT_AMBULATORY_CARE_PROVIDER_SITE_OTHER): Payer: Medicare PPO | Admitting: Family Medicine

## 2013-03-21 ENCOUNTER — Ambulatory Visit: Payer: Medicare PPO

## 2013-03-21 VITALS — BP 146/74 | HR 74 | Temp 97.8°F | Resp 16 | Ht 60.0 in | Wt 141.0 lb

## 2013-03-21 DIAGNOSIS — R8290 Unspecified abnormal findings in urine: Secondary | ICD-10-CM

## 2013-03-21 DIAGNOSIS — R5381 Other malaise: Secondary | ICD-10-CM

## 2013-03-21 DIAGNOSIS — R109 Unspecified abdominal pain: Secondary | ICD-10-CM

## 2013-03-21 DIAGNOSIS — R5383 Other fatigue: Secondary | ICD-10-CM

## 2013-03-21 DIAGNOSIS — R110 Nausea: Secondary | ICD-10-CM

## 2013-03-21 DIAGNOSIS — R197 Diarrhea, unspecified: Secondary | ICD-10-CM

## 2013-03-21 DIAGNOSIS — Z8679 Personal history of other diseases of the circulatory system: Secondary | ICD-10-CM

## 2013-03-21 DIAGNOSIS — K529 Noninfective gastroenteritis and colitis, unspecified: Secondary | ICD-10-CM

## 2013-03-21 LAB — POCT CBC
Granulocyte percent: 51.7 % (ref 37–80)
HCT, POC: 44.8 % (ref 37.7–47.9)
Hemoglobin: 14.5 g/dL (ref 12.2–16.2)
Lymph, poc: 2.1 (ref 0.6–3.4)
MCH, POC: 31.3 pg — AB (ref 27–31.2)
MCHC: 32.4 g/dL (ref 31.8–35.4)
MCV: 96.5 fL (ref 80–97)
MID (cbc): 0.6 (ref 0–0.9)
MPV: 8.2 fL (ref 0–99.8)
POC Granulocyte: 2.9 (ref 2–6.9)
POC LYMPH PERCENT: 37 %L (ref 10–50)
POC MID %: 11.3 %M (ref 0–12)
Platelet Count, POC: 269 10*3/uL (ref 142–424)
RBC: 4.64 M/uL (ref 4.04–5.48)
RDW, POC: 14.9 %
WBC: 5.6 10*3/uL (ref 4.6–10.2)

## 2013-03-21 LAB — POCT UA - MICROSCOPIC ONLY
Casts, Ur, LPF, POC: NEGATIVE
Crystals, Ur, HPF, POC: NEGATIVE
Mucus, UA: NEGATIVE
Yeast, UA: NEGATIVE

## 2013-03-21 LAB — POCT URINALYSIS DIPSTICK
Bilirubin, UA: NEGATIVE
Glucose, UA: NEGATIVE
Ketones, UA: NEGATIVE
Nitrite, UA: NEGATIVE
Protein, UA: NEGATIVE
Spec Grav, UA: 1.01
Urobilinogen, UA: 0.2
pH, UA: 7

## 2013-03-21 NOTE — Patient Instructions (Signed)
Diet for Diarrhea, Adult Frequent, runny stools (diarrhea) may be caused or worsened by food or drink. Diarrhea may be relieved by changing your diet. Since diarrhea can last up to 7 days, it is easy for you to lose too much fluid from the body and become dehydrated. Fluids that are lost need to be replaced. Along with a modified diet, make sure you drink enough fluids to keep your urine clear or pale yellow. DIET INSTRUCTIONS  Ensure adequate fluid intake (hydration): have 1 cup (8 oz) of fluid for each diarrhea episode. Avoid fluids that contain simple sugars or sports drinks, fruit juices, whole milk products, and sodas. Your urine should be clear or pale yellow if you are drinking enough fluids. Hydrate with an oral rehydration solution that you can purchase at pharmacies, retail stores, and online. You can prepare an oral rehydration solution at home by mixing the following ingredients together:    tsp table salt.   tsp baking soda.   tsp salt substitute containing potassium chloride.  1  tablespoons sugar.  1 L (34 oz) of water.  Certain foods and beverages may increase the speed at which food moves through the gastrointestinal (GI) tract. These foods and beverages should be avoided and include:  Caffeinated and alcoholic beverages.  High-fiber foods, such as raw fruits and vegetables, nuts, seeds, and whole grain breads and cereals.  Foods and beverages sweetened with sugar alcohols, such as xylitol, sorbitol, and mannitol.  Some foods may be well tolerated and may help thicken stool including:  Starchy foods, such as rice, toast, pasta, low-sugar cereal, oatmeal, grits, baked potatoes, crackers, and bagels.   Bananas.   Applesauce.  Add probiotic-rich foods to help increase healthy bacteria in the GI tract, such as yogurt and fermented milk products. RECOMMENDED FOODS AND BEVERAGES Starches Choose foods with less than 2 g of fiber per serving.  Recommended:  White,  [place], and pita breads, plain rolls, buns, bagels. Plain muffins, matzo. Soda, saltine, or graham crackers. Pretzels, melba toast, zwieback. Cooked cereals made with water: cornmeal, farina, cream cereals. Dry cereals: refined corn, wheat, rice. Potatoes prepared any way without skins, refined macaroni, spaghetti, noodles, refined rice.  Avoid:  Bread, rolls, or crackers made with whole wheat, multi-grains, rye, bran seeds, nuts, or coconut. Corn tortillas or taco shells. Cereals containing whole grains, multi-grains, bran, coconut, nuts, raisins. Cooked or dry oatmeal. Coarse wheat cereals, granola. Cereals advertised as "high-fiber." Potato skins. Whole grain pasta, wild or brown rice. Popcorn. Sweet potatoes, yams. Sweet rolls, doughnuts, waffles, pancakes, sweet breads. Vegetables  Recommended: Strained tomato and vegetable juices. Most well-cooked and canned vegetables without seeds. Fresh: Tender lettuce, cucumber without the skin, cabbage, spinach, bean sprouts.  Avoid: Fresh, cooked, or canned: Artichokes, baked beans, beet greens, broccoli, Brussels sprouts, corn, kale, legumes, peas, sweet potatoes. Cooked: Green or red cabbage, spinach. Avoid large servings of any vegetables because vegetables shrink when cooked, and they contain more fiber per serving than fresh vegetables. Fruit  Recommended: Cooked or canned: Apricots, applesauce, cantaloupe, cherries, fruit cocktail, grapefruit, grapes, kiwi, mandarin oranges, peaches, pears, plums, watermelon. Fresh: Apples without skin, ripe banana, grapes, cantaloupe, cherries, grapefruit, peaches, oranges, plums. Keep servings limited to  cup or 1 piece.  Avoid: Fresh: Apples with skin, apricots, mangoes, pears, raspberries, strawberries. Prune juice, stewed or dried prunes. Dried fruits, raisins, dates. Large servings of all fresh fruits. Protein  Recommended: Ground or well-cooked tender beef, ham, veal, lamb, pork, or poultry. Eggs. Fish,  oysters, shrimp,  lobster, other seafoods. Liver, organ meats.  Avoid: Tough, fibrous meats with gristle. Peanut butter, smooth or chunky. Cheese, nuts, seeds, legumes, dried peas, beans, lentils. Dairy  Recommended: Yogurt, lactose-free milk, kefir, drinkable yogurt, buttermilk, soy milk, or plain hard cheese.  Avoid: Milk, chocolate milk, beverages made with milk, such as milkshakes. Soups  Recommended: Bouillon, broth, or soups made from allowed foods. Any strained soup.  Avoid: Soups made from vegetables that are not allowed, cream or milk-based soups. Desserts and Sweets  Recommended: Sugar-free gelatin, sugar-free frozen ice pops made without sugar alcohol.  Avoid: Plain cakes and cookies, pie made with fruit, pudding, custard, cream pie. Gelatin, fruit, ice, sherbet, frozen ice pops. Ice cream, ice milk without nuts. Plain hard candy, honey, jelly, molasses, syrup, sugar, chocolate syrup, gumdrops, marshmallows. Fats and Oils  Recommended: Limit fats to less than 8 tsp per day.  Avoid: Seeds, nuts, olives, avocados. Margarine, butter, cream, mayonnaise, salad oils, plain salad dressings. Plain gravy, crisp bacon without rind. Beverages  Recommended: Water, decaffeinated teas, oral rehydration solutions, sugar-free beverages not sweetened with sugar alcohols.  Avoid: Fruit juices, caffeinated beverages (coffee, tea, soda), alcohol, sports drinks, or lemon-lime soda. Condiments  Recommended: Ketchup, mustard, horseradish, vinegar, cocoa powder. Spices in moderation: allspice, basil, bay leaves, celery powder or leaves, cinnamon, cumin powder, curry powder, ginger, mace, marjoram, onion or garlic powder, oregano, paprika, parsley flakes, ground pepper, rosemary, sage, savory, tarragon, thyme, turmeric.  Avoid: Coconut, honey. Document Released: 10/26/2003 Document Revised: 04/29/2012 Document Reviewed: 12/20/2011 [facility] Patient Information 2014 [place],  [place]. Diarrhea Diarrhea is watery poop (stool). It can make you feel weak, tired, thirsty, or give you a dry mouth (signs of dehydration). Watery poop is a sign of another problem, most often an infection. It often lasts 2 3 days. It can last longer if it is a sign of something serious. Take care of yourself as told by your doctor. HOME CARE   Drink 1 cup (8 ounces) of fluid each time you have watery poop.  Do not drink the following fluids:  Those that contain simple sugars (fructose, glucose, galactose, lactose, sucrose, maltose).  Sports drinks.  Fruit juices.  Whole milk products.  Sodas.  Drinks with caffeine (coffee, tea, soda) or alcohol.  Oral rehydration solution may be used if the doctor says it is okay. You may make your own solution. Follow this recipe:    teaspoon table salt.   teaspoon baking soda.   teaspoon salt substitute containing potassium chloride.  1 tablespoons sugar.  1 liter (34 ounces) of water.  Avoid the following foods:  High fiber foods, such as raw fruits and vegetables.  Nuts, seeds, and whole grain breads and cereals.   Those that are sweetened with sugar alcohols (xylitol, sorbitol, mannitol).  Try eating the following foods:  Starchy foods, such as rice, toast, pasta, low-sugar cereal, oatmeal, baked potatoes, crackers, and bagels.  Bananas.  Applesauce.  Eat probiotic-rich foods, such as yogurt and milk products that are fermented.  Wash your hands well after each time you have watery poop.  Only take medicine as told by your doctor.  Take a warm bath to help lessen burning or pain from having watery poop. GET HELP RIGHT AWAY IF:   You cannot drink fluids without throwing up (vomiting).  You keep throwing up.  You have blood in your poop, or your poop looks black and tarry.  You do not pee (urinate) in 6 8 hours, or there is only a small amount  of very dark pee.  You have belly (abdominal) pain that gets worse or  stays in the same spot (localizes).  You are weak, dizzy, confused, or lightheaded.  You have a very bad headache.  Your watery poop gets worse or does not get better.  You have a fever or lasting symptoms for more than 2 3 days.  You have a fever and your symptoms suddenly get worse. MAKE SURE YOU:   Understand these instructions.  Will watch your condition.  Will get help right away if you are not doing well or get worse. Document Released: 01/22/2008 Document Revised: 04/29/2012 Document Reviewed: 04/12/2012 [facility] Patient Information 2014 [place], [place].

## 2013-03-21 NOTE — Progress Notes (Signed)
[facility]:  Office Visit  Chief Complaint:  Chief Complaint  Patient presents with  . Abdominal Pain  . Diarrhea    x 3 days    HPI: Kristen Whitehead is a 79 y.o. female who complains of  abdmonial pain from midepigastrum to pelvis, has been tired and not feeling herself.  Has had nonbloody diarrhea, green loose stool, 8-9 epsiodes x 3 days. Has been on abx. Last BM was yesterday. Took immodium with some relief. Yesterday she went 3-4 times and did not want to do it and so got Immodium. She has not had BM since. No family history of colitis, colon cancer, diverticular disease. Med changes about 1-2 months ago, had been on abx several months ago for bronchitis, no new foods, no new travels, no rashes.  Denies fevers or chills. Deneis CP, she has chronic SOB. She had to buy new clothes last week since nothing was fitting her. Denies any leg swelling She has cough, she sleeps with her feet up with 2 foam pieces, She uses 1 pillow, she has a history of afib which she is chronic anticoagulation and also CHF.  She has been compliant with her meds, she has not had any increased salt intake.  She just has "not been feeling like myself" She has a prolapsed bladder and wears a pessary She denies any new urinary  She has not taken any new meds except for a newly dx of Parkinsons and was started on Mirapex in July She also has a history of thyroid cancer and her thyroid medication was incrased from 88 mcg to 100 mcg about 2 months ago, has not had recheck of TSH since change   Past Medical History  Diagnosis Date  . History of mitral valve prolapse 06/15/2008    a. echo 1/14: mild LVH, EF 60-65%, mild MR, mild to mod BAE, PASP 35  . Chronic atrial fibrillation     managed with rate control and coumadin  . History of congenital mitral regurgitation     mild  . Hypothyroidism   . Hypercholesterolemia   . GERD (gastroesophageal reflux disease)   . Chest pain     no known  ischemic heart disease; negative Myoview July 2013. EF 75% with no ischemia.   . Hypertension   . Bladder prolapse, female, acquired   . Chronic diastolic heart failure   . Chronic anticoagulation    Thyroid Cancer   . Heart disease    Past Surgical History  Procedure Laterality Date  . Thyroidectomy    . Cholecystectomy    . Abdominal hysterectomy     History   Social History  . Marital Status: Widowed    Spouse Name: N/A    Number of Children: 3  . Years of Education: BA   Occupational History  .     Social History Main Topics  . Smoking status: Never Smoker   . Smokeless tobacco: Never Used  . Alcohol Use: No  . Drug Use: No  . Sexually Active: No   Other Topics Concern  . None   Social History Narrative   Pt lives at home alone.   Caffeine Use: quit 65yrs ago   Family History  Problem Relation Age of Onset  . Heart attack Neg Hx   . Heart disease Neg Hx   . Stroke Mother   . Stroke Father    Allergies  Allergen Reactions  . Demerol   . Ebastine  EBS  . Ibuprofen   . Statins     myalgias  . Tetanus Toxoids   . Tetracyclines & Related    Prior to Admission medications   Medication Sig Start Date End Date Taking? Authorizing Provider  amLODipine (NORVASC) 5 MG tablet Take 0.5 tablets (2.5 mg total) by mouth daily. 1/2 tablet once a day 11/16/12  Yes Cassell Clement, MD  atenolol (TENORMIN) 25 MG tablet Take 1 tablet (25 mg total) by mouth as directed. 2 to 3 times a day 02/09/13  Yes Cassell Clement, MD  beta carotene w/minerals (OCUVITE) tablet Take 1 tablet by mouth daily.   Yes Historical Provider, MD  calcium carbonate (OS-CAL - DOSED IN MG OF ELEMENTAL CALCIUM) 1250 MG tablet Take 1 tablet by mouth daily.   Yes Historical Provider, MD  Cholecalciferol (VITAMIN D) 2000 UNITS tablet Take 2,000 Units by mouth daily.    Yes Historical Provider, MD  estradiol (ESTRACE VAGINAL) 0.1 MG/GM vaginal cream Place 2 g vaginally 2 (two) times a week.    Yes  Historical Provider, MD  furosemide (LASIX) 20 MG tablet Take 40 mg by mouth daily. 11/16/12  Yes Cassell Clement, MD  isosorbide mononitrate (IMDUR) 30 MG 24 hr tablet Take 1 tablet (30 mg total) by mouth daily. 12/01/12  Yes Cassell Clement, MD  levothyroxine (SYNTHROID, LEVOTHROID) 75 MCG tablet Take 75 mcg by mouth daily.     Yes Historical Provider, MD  nitroGLYCERIN (NITROSTAT) 0.4 MG SL tablet Place 1 tablet (0.4 mg total) under the tongue every 5 (five) minutes x 3 doses as needed. For chest pain. 11/16/12  Yes Cassell Clement, MD  potassium chloride (K-DUR) 10 MEQ tablet Take 1 tablet (10 mEq total) by mouth daily. 09/10/12  Yes Scott Moishe Spice, PA-C  pramipexole (MIRAPEX) 0.125 MG tablet 1 pill each night for 1 week, the 2 pills each night thereafter. 02/17/13  Yes Huston Foley, MD  temazepam (RESTORIL) 15 MG capsule Take 15 mg by mouth at bedtime.   Yes Historical Provider, MD  travoprost, benzalkonium, (TRAVATAN) 0.004 % ophthalmic solution Place 1 drop into the left eye at bedtime.    Yes Historical Provider, MD  vitamin C (ASCORBIC ACID) 500 MG tablet Take 500 mg by mouth daily.   Yes Historical Provider, MD  warfarin (COUMADIN) 5 MG tablet Take as directed by anticoagulation clinic 11/19/12  Yes Cassell Clement, MD     ROS: The patient denies fevers, chills, night sweats, unintentional weight loss, chest pain, wheezing,  nausea, vomiting,  dysuria, hematuria, melena, numbness, weakness, or tingling. + abd pain, diarrhea  All other systems have been reviewed and were otherwise negative with the exception of those mentioned in the HPI and as above.    PHYSICAL EXAM: Filed Vitals:   03/21/13 1548  BP: 146/74  Pulse: 74  Temp: 97.8 F (36.6 C)  Resp: 16   Filed Vitals:   03/21/13 1548  Height: 5' (1.524 m)  Weight: 141 lb (63.957 kg)   Body mass index is 27.54 kg/(m^2).  General: Alert, no acute distress HEENT:  Normocephalic, atraumatic, oropharynx patent.  Cardiovascular:   Irregular rate and rhythm, no rubs, + murmur, no gallops.  No Carotid bruits, radial pulse intact. No pedal edema.  Respiratory: Clear to auscultation bilaterally.  No wheezes, rales, or rhonchi.  No cyanosis, no use of accessory musculature GI: No organomegaly, abdomen is soft and non-tender, positive bowel sounds.  No masses. Skin: No rashes. Neurologic: Facial musculature symmetric. Psychiatric: Patient is appropriate  throughout our interaction. Lymphatic: No cervical lymphadenopathy Musculoskeletal: Gait intact.   LABS: Results for orders placed in visit on 03/21/13  POCT CBC      Result Value Range   WBC 5.6  4.6 - 10.2 K/uL   Lymph, poc 2.1  0.6 - 3.4   POC LYMPH PERCENT 37.0  10 - 50 %L   MID (cbc) 0.6  0 - 0.9   POC MID % 11.3  0 - 12 %M   POC Granulocyte 2.9  2 - 6.9   Granulocyte percent 51.7  37 - 80 %G   RBC 4.64  4.04 - 5.48 M/uL   Hemoglobin 14.5  12.2 - 16.2 g/dL   HCT, POC 57.8  46.9 - 47.9 %   MCV 96.5  80 - 97 fL   MCH, POC 31.3 (*) 27 - 31.2 pg   MCHC 32.4  31.8 - 35.4 g/dL   RDW, POC 62.9     Platelet Count, POC 269  142 - 424 K/uL   MPV 8.2  0 - 99.8 fL  POCT UA - MICROSCOPIC ONLY      Result Value Range   WBC, Ur, HPF, POC 4-6     RBC, urine, microscopic 5-11     Bacteria, U Microscopic trace     Mucus, UA neg     Epithelial cells, urine per micros 1-2     Crystals, Ur, HPF, POC neg     Casts, Ur, LPF, POC neg     Yeast, UA neg    POCT URINALYSIS DIPSTICK      Result Value Range   Color, UA yellow     Clarity, UA clear     Glucose, UA neg     Bilirubin, UA neg     Ketones, UA neg     Spec Grav, UA 1.010     Blood, UA mod     pH, UA 7.0     Protein, UA neg     Urobilinogen, UA 0.2     Nitrite, UA neg     Leukocytes, UA moderate (2+)       EKG/XRAY:   Primary read interpreted by Dr. Conley Rolls at [facility]. Chest shows no acute cardiuopulmonary process She has a circular pessary Please comment on well circumscribed opacity in right pelvic area, at  11 oclock above pessary   ASSESSMENT/PLAN: Encounter Diagnoses  Name Primary?  . Abdominal pain, acute Yes  . Nausea alone   . History of atrial fibrillation   . History of CHF (congestive heart failure)   . Diarrhea   . Other malaise and fatigue   . Gastroenteritis   . Abnormal urine    Pleasant 79 y/o female with multiple chronic medical problems who presents with abd pain, diarrhea and "not feeling like herself":  Will cx urine due to abnormal UA Labs pending before putting her on any meds This may be a contaminant since she wears a pessary She is to see nurse practitioner tomorrow for her pessary and perhaps they can get a clean catch if they see anything abnormal upon pessary removal, otherwise we will wait for urine cx  EKG and CXR shows no acute changes CMP pending F/u by phone or prn, f/u with Dr. Clent Ridges her GI doctor  If she continues to have diarrhea then I will also get her to do a stool sample for C/ diff , colitis.  Currently she does not have localized LLQ abd pain, no blood inher stools and  a non-impressive white count.     Sufyaan Palma PHUONG, DO 03/21/2013 6:10 PM

## 2013-03-22 ENCOUNTER — Other Ambulatory Visit: Payer: Self-pay | Admitting: Gastroenterology

## 2013-03-22 DIAGNOSIS — R109 Unspecified abdominal pain: Secondary | ICD-10-CM

## 2013-03-22 LAB — COMPREHENSIVE METABOLIC PANEL
AST: 217 U/L — ABNORMAL HIGH (ref 0–37)
Alkaline Phosphatase: 119 U/L — ABNORMAL HIGH (ref 39–117)
BUN: 10 mg/dL (ref 6–23)
Creat: 0.68 mg/dL (ref 0.50–1.10)
Potassium: 3.7 mEq/L (ref 3.5–5.3)
Total Bilirubin: 0.8 mg/dL (ref 0.3–1.2)

## 2013-03-22 LAB — TSH: TSH: 0.145 u[IU]/mL — ABNORMAL LOW (ref 0.350–4.500)

## 2013-03-22 LAB — COMPREHENSIVE METABOLIC PANEL WITH GFR
ALT: 125 U/L — ABNORMAL HIGH (ref 0–35)
Albumin: 3.9 g/dL (ref 3.5–5.2)
CO2: 31 meq/L (ref 19–32)
Calcium: 8.6 mg/dL (ref 8.4–10.5)
Chloride: 102 meq/L (ref 96–112)
Glucose, Bld: 100 mg/dL — ABNORMAL HIGH (ref 70–99)
Sodium: 138 meq/L (ref 135–145)
Total Protein: 6.5 g/dL (ref 6.0–8.3)

## 2013-03-23 ENCOUNTER — Encounter: Payer: Self-pay | Admitting: Family Medicine

## 2013-03-23 ENCOUNTER — Telehealth: Payer: Self-pay | Admitting: Family Medicine

## 2013-03-23 LAB — LIPASE: Lipase: 27 U/L (ref 0–75)

## 2013-03-23 NOTE — Telephone Encounter (Signed)
Spoke with patient about abnormal labs. She has not had any diarrhea since we last spoke. She has been to see Dr. Matthias Hughs, she is scheduled for a CT abd/pelvis on Thursday. I advise her that I have added additional labs specifically hepatitis panel and also Lipase to her blood work. I will forward the CMP and the TSH to her [facility] physicians, Drs. Buccinni, Sharl Ma, and Little. Will f/u with her once we get labs.

## 2013-03-24 ENCOUNTER — Encounter: Payer: Self-pay | Admitting: Cardiology

## 2013-03-24 ENCOUNTER — Ambulatory Visit (INDEPENDENT_AMBULATORY_CARE_PROVIDER_SITE_OTHER): Payer: Medicare PPO | Admitting: Cardiology

## 2013-03-24 ENCOUNTER — Ambulatory Visit (INDEPENDENT_AMBULATORY_CARE_PROVIDER_SITE_OTHER): Payer: Medicare PPO | Admitting: *Deleted

## 2013-03-24 ENCOUNTER — Telehealth: Payer: Self-pay | Admitting: Family Medicine

## 2013-03-24 VITALS — BP 118/70 | HR 72 | Ht 59.0 in | Wt 140.1 lb

## 2013-03-24 DIAGNOSIS — I340 Nonrheumatic mitral (valve) insufficiency: Secondary | ICD-10-CM

## 2013-03-24 DIAGNOSIS — R079 Chest pain, unspecified: Secondary | ICD-10-CM

## 2013-03-24 DIAGNOSIS — I5032 Chronic diastolic (congestive) heart failure: Secondary | ICD-10-CM

## 2013-03-24 DIAGNOSIS — Z7901 Long term (current) use of anticoagulants: Secondary | ICD-10-CM

## 2013-03-24 DIAGNOSIS — I4891 Unspecified atrial fibrillation: Secondary | ICD-10-CM

## 2013-03-24 DIAGNOSIS — I059 Rheumatic mitral valve disease, unspecified: Secondary | ICD-10-CM

## 2013-03-24 LAB — URINE CULTURE: Colony Count: 30000

## 2013-03-24 LAB — HEPATITIS PANEL, ACUTE
HCV Ab: NEGATIVE
Hep A IgM: NEGATIVE
Hep B C IgM: NEGATIVE
Hepatitis B Surface Ag: NEGATIVE

## 2013-03-24 NOTE — Assessment & Plan Note (Signed)
The patient has had no recurrent chest pain or angina 

## 2013-03-24 NOTE — Patient Instructions (Addendum)
Your physician recommends that you continue on your current medications as directed. Please refer to the Current Medication list given to you today.  Your physician recommends that you schedule a follow-up appointment in: 3 months  

## 2013-03-24 NOTE — Telephone Encounter (Signed)
Told patient urine culture did not show any predmoninat growth, no treatment needed. She feels better now. Also LM with Drs Sharl Ma and Buccini nurses regarding abnormal lab findings, she will f/u with Dr. Matthias Hughs after CT abdomen/pelvis.  I have not adjusted her thyroid medication since she has had thyroid cancer s/p resection, I do not know how long ago and type and if they want her TSH to be slightly below nl or at the lower end of the normal range. Will defer to Dr. Sharl Ma.

## 2013-03-24 NOTE — Assessment & Plan Note (Signed)
The patient is not having any recent worsening of her chronic dyspnea

## 2013-03-24 NOTE — Assessment & Plan Note (Signed)
The patient has not had any TIA symptoms.  Her recent prothrombin times have been somewhat erratic

## 2013-03-24 NOTE — Progress Notes (Signed)
Kristen Whitehead Date of Birth:  04/18/1923 [facility] [address] [place], [place]  [address] [phone]         Fax   [phone]  History of Present Illness: This pleasant 79 year old woman is seen for a scheduled followup office visit. She has a history of established atrial fibrillation. She does not have any history of ischemic heart disease. She had a normal Myoview stress test in January 2011. [name] saw her as a work in on 03/12/12 and she had another Myoview on 03/18/12 which showed no evidence of ischemia and her ejection fraction was 75%. In January 2014 she was seen by [name]. She subsequently had an update of her echocardiogram on 09/17/12 which showed an ejection fraction of 60-65% and biatrial enlargement. There was mild mitral regurgitation.  The patient has a history of compensated diastolic congestive heart failure.  Dr. Elmyra Ricks did a B. natruretic peptide on 01/27/13 which was elevated at 443 the patient had electrolytes drawn on 03/04/13 showing BUN of 8 and creatinine 0.61 On Sunday, August 3 the patient went to urgent medical care Center because of abdominal pain and was evaluated.  She was found to have elevated liver function studies with AST of 217 and ALT of 125.  She saw her gastroenterologist Dr. Matthias Hughs yesterday who ordered a CT scan of the abdomen.  The patient regarding her gallbladder out.  The patient had a chest x-ray on 83/14 which showed normal heart size and clear lungs    Current Outpatient Prescriptions  Medication Sig Dispense Refill  . amLODipine (NORVASC) 5 MG tablet Take 0.5 tablets (2.5 mg total) by mouth daily. 1/2 tablet once a day  45 tablet  3  . atenolol (TENORMIN) 25 MG tablet Take 1 tablet (25 mg total) by mouth as directed. 2 to 3 times a day  60 tablet  3  . beta carotene w/minerals (OCUVITE) tablet Take 1 tablet by mouth daily.      . calcium carbonate (OS-CAL - DOSED IN MG OF ELEMENTAL CALCIUM) 1250 MG  tablet Take 1 tablet by mouth daily.      . Cholecalciferol (VITAMIN D) 2000 UNITS tablet Take 2,000 Units by mouth daily.       Marland Kitchen estradiol (ESTRACE VAGINAL) 0.1 MG/GM vaginal cream Place 2 g vaginally 2 (two) times a week.       . furosemide (LASIX) 20 MG tablet Take 40 mg by mouth every other day. Alternates between 20 and 40 mg      . isosorbide mononitrate (IMDUR) 30 MG 24 hr tablet Take 1 tablet (30 mg total) by mouth daily.  90 tablet  3  . latanoprost (XALATAN) 0.005 % ophthalmic solution Place 1 drop into the left eye at bedtime.       Marland Kitchen levothyroxine (SYNTHROID, LEVOTHROID) 75 MCG tablet Take 75 mcg by mouth daily.        . nitroGLYCERIN (NITROSTAT) 0.4 MG SL tablet Place 1 tablet (0.4 mg total) under the tongue every 5 (five) minutes x 3 doses as needed. For chest pain.  25 tablet  5  . potassium chloride (K-DUR) 10 MEQ tablet Take 1 tablet (10 mEq total) by mouth daily.  30 tablet  11  . pramipexole (MIRAPEX) 0.125 MG tablet 1 pill each night for 1 week, the 2 pills each night thereafter.  60 tablet  5  . temazepam (RESTORIL) 15 MG capsule Take 15 mg by mouth at bedtime.      Marland Kitchen  vitamin C (ASCORBIC ACID) 500 MG tablet Take 500 mg by mouth daily.      Marland Kitchen warfarin (COUMADIN) 5 MG tablet Take as directed by anticoagulation clinic  90 tablet  1   No current facility-administered medications for this visit.    Allergies  Allergen Reactions  . Demerol   . Ebastine     EBS  . Ibuprofen   . Statins     myalgias  . Tetanus Toxoids   . Tetracyclines & Related     Patient Active Problem List   Diagnosis Date Noted  . Chest pain at rest 03/12/2012  . Cough 09/16/2011  . Fatigue 04/08/2011  . Benign hypertensive heart disease without heart failure 02/11/2011  . Mitral regurgitation 02/11/2011  . Hypothyroidism 02/11/2011  . Hypercholesterolemia 02/11/2011  . Atrial fibrillation 12/03/2010    History  Smoking status  . Never Smoker   Smokeless tobacco  . Never Used     History  Alcohol Use No    Family History  Problem Relation Age of Onset  . Heart attack Neg Hx   . Heart disease Neg Hx   . Stroke Mother   . Stroke Father     Review of Systems: Constitutional: no fever chills diaphoresis or fatigue or change in weight.  Head and neck: no hearing loss, no epistaxis, no photophobia or visual disturbance. Respiratory: No cough, shortness of breath or wheezing. Cardiovascular: No chest pain peripheral edema, palpitations. Gastrointestinal: No abdominal distention, no abdominal pain, no change in bowel habits hematochezia or melena. Genitourinary: No dysuria, no frequency, no urgency, no nocturia. Musculoskeletal:No arthralgias, no back pain, no gait disturbance or myalgias. Neurological: No dizziness, no headaches, no numbness, no seizures, no syncope, no weakness, no tremors. Hematologic: No lymphadenopathy, no easy bruising. Psychiatric: No confusion, no hallucinations, no sleep disturbance.    Physical Exam: Filed Vitals:   03/24/13 1532  BP: 118/70  Pulse: 72   the general appearance reveals a well-developed well-nourished woman in no acute distress.The head and neck exam reveals pupils equal and reactive.  Extraocular movements are full.  There is no scleral icterus.  The mouth and pharynx are normal.  The neck is supple.  The carotids reveal no bruits.  The jugular venous pressure is normal.  The  thyroid is not enlarged.  There is no lymphadenopathy.  The chest is clear to percussion and auscultation.  There are no rales or rhonchi.  Expansion of the chest is symmetrical.  The precordium is quiet.  The first heart sound is normal.  The second heart sound is physiologically split.  There is no murmur gallop rub or click.  There is no abnormal lift or heave.  The abdomen is soft and nontender.  The bowel sounds are normal.  The liver and spleen are not enlarged.  There are no abdominal masses.  There are no abdominal bruits.  Extremities  reveal good pedal pulses.  There is no phlebitis or edema.  There is no cyanosis or clubbing.  Strength is normal and symmetrical in all extremities.  There is no lateralizing weakness.  There are no sensory deficits.  The skin is warm and dry.  There is no rash.     Assessment / Plan: She is to continue same medication.  Current dose of Lasix appears to be appropriate.  Her recent chest x-ray showed no evidence of failure and her heart size is normal and she has no peripheral edema. Recheck here in 3 months for followup office  visit and EKG

## 2013-03-25 ENCOUNTER — Other Ambulatory Visit: Payer: Medicare PPO

## 2013-03-25 ENCOUNTER — Telehealth: Payer: Self-pay | Admitting: Family Medicine

## 2013-03-25 ENCOUNTER — Ambulatory Visit
Admission: RE | Admit: 2013-03-25 | Discharge: 2013-03-25 | Disposition: A | Discharge status: Home / Self Care | Payer: Medicare PPO | Source: Ambulatory Visit | Attending: Gastroenterology | Admitting: Gastroenterology

## 2013-03-25 DIAGNOSIS — R109 Unspecified abdominal pain: Secondary | ICD-10-CM

## 2013-03-25 MED ORDER — IOHEXOL 300 MG/ML  SOLN
100.0000 mL | Freq: Once | INTRAMUSCULAR | Status: AC | PRN
  Administered 2013-03-25: 100 mL via INTRAVENOUS

## 2013-03-25 NOTE — Telephone Encounter (Signed)
LM that Acute Hepatitis Panel Normal.

## 2013-03-30 ENCOUNTER — Telehealth: Payer: Self-pay | Admitting: Neurology

## 2013-04-01 NOTE — Telephone Encounter (Signed)
Patient calling for test results. Returned call, no answer.

## 2013-04-02 ENCOUNTER — Ambulatory Visit
Admission: RE | Admit: 2013-04-02 | Discharge: 2013-04-02 | Disposition: A | Discharge status: Home / Self Care | Payer: Medicare PPO | Source: Ambulatory Visit

## 2013-04-02 DIAGNOSIS — Z1231 Encounter for screening mammogram for malignant neoplasm of breast: Secondary | ICD-10-CM

## 2013-04-06 ENCOUNTER — Other Ambulatory Visit: Payer: Self-pay | Admitting: *Deleted

## 2013-04-06 ENCOUNTER — Encounter: Payer: Self-pay | Admitting: Neurology

## 2013-04-06 ENCOUNTER — Other Ambulatory Visit: Payer: Self-pay | Admitting: Obstetrics and Gynecology

## 2013-04-06 DIAGNOSIS — R928 Other abnormal and inconclusive findings on diagnostic imaging of breast: Secondary | ICD-10-CM

## 2013-04-07 ENCOUNTER — Ambulatory Visit (INDEPENDENT_AMBULATORY_CARE_PROVIDER_SITE_OTHER): Payer: Medicare PPO | Admitting: *Deleted

## 2013-04-07 ENCOUNTER — Ambulatory Visit: Payer: Medicare PPO | Admitting: Cardiology

## 2013-04-07 DIAGNOSIS — I4891 Unspecified atrial fibrillation: Secondary | ICD-10-CM

## 2013-04-07 DIAGNOSIS — Z7901 Long term (current) use of anticoagulants: Secondary | ICD-10-CM

## 2013-04-08 ENCOUNTER — Telehealth: Payer: Self-pay | Admitting: Neurology

## 2013-04-09 ENCOUNTER — Other Ambulatory Visit: Payer: Self-pay | Admitting: Cardiology

## 2013-04-09 MED ORDER — FUROSEMIDE 20 MG PO TABS: 40.0000 mg | ORAL_TABLET | ORAL | Status: DC

## 2013-04-09 NOTE — Telephone Encounter (Signed)
Can you please find out from the patient by talking to her directly as to what test results she would like to have the results of. Please note that we have already relate blood work results and EMG results to her before.

## 2013-04-21 ENCOUNTER — Ambulatory Visit (INDEPENDENT_AMBULATORY_CARE_PROVIDER_SITE_OTHER): Payer: Medicare HMO | Admitting: Pharmacist

## 2013-04-21 ENCOUNTER — Telehealth: Payer: Self-pay | Admitting: Pharmacist

## 2013-04-21 DIAGNOSIS — I4891 Unspecified atrial fibrillation: Secondary | ICD-10-CM

## 2013-04-21 DIAGNOSIS — Z7901 Long term (current) use of anticoagulants: Secondary | ICD-10-CM

## 2013-04-21 NOTE — Telephone Encounter (Signed)
Returned call to pt, advised we will not adjust Coumadin dosage until we check INR.  If concerned INR too high, blood too thin we can check today in office.  Appt made for today 04/21/13 at 1:15pm.  Pt aware, verbalized understanding.

## 2013-04-21 NOTE — Telephone Encounter (Signed)
New Problem  Pt states she has developed a bruise and did not hurt herself.. asks if she may need her blood thinner reduced.

## 2013-04-29 ENCOUNTER — Ambulatory Visit
Admission: RE | Admit: 2013-04-29 | Discharge: 2013-04-29 | Disposition: A | Discharge status: Home / Self Care | Payer: Commercial Managed Care - HMO | Source: Ambulatory Visit | Attending: Obstetrics and Gynecology | Admitting: Obstetrics and Gynecology

## 2013-04-29 DIAGNOSIS — R928 Other abnormal and inconclusive findings on diagnostic imaging of breast: Secondary | ICD-10-CM

## 2013-05-04 ENCOUNTER — Ambulatory Visit (INDEPENDENT_AMBULATORY_CARE_PROVIDER_SITE_OTHER): Payer: Medicare PPO

## 2013-05-04 DIAGNOSIS — I4891 Unspecified atrial fibrillation: Secondary | ICD-10-CM

## 2013-05-04 DIAGNOSIS — Z7901 Long term (current) use of anticoagulants: Secondary | ICD-10-CM

## 2013-05-04 LAB — POCT INR: INR: 2

## 2013-05-05 ENCOUNTER — Other Ambulatory Visit: Payer: Self-pay | Admitting: Cardiology

## 2013-05-07 ENCOUNTER — Ambulatory Visit (INDEPENDENT_AMBULATORY_CARE_PROVIDER_SITE_OTHER): Payer: Medicare PPO | Admitting: Cardiology

## 2013-05-07 ENCOUNTER — Other Ambulatory Visit: Payer: Self-pay

## 2013-05-07 ENCOUNTER — Encounter: Payer: Self-pay | Admitting: Cardiology

## 2013-05-07 VITALS — BP 142/78 | HR 85 | Ht 59.0 in | Wt 142.8 lb

## 2013-05-07 DIAGNOSIS — I119 Hypertensive heart disease without heart failure: Secondary | ICD-10-CM

## 2013-05-07 DIAGNOSIS — I340 Nonrheumatic mitral (valve) insufficiency: Secondary | ICD-10-CM

## 2013-05-07 DIAGNOSIS — I4891 Unspecified atrial fibrillation: Secondary | ICD-10-CM

## 2013-05-07 DIAGNOSIS — I059 Rheumatic mitral valve disease, unspecified: Secondary | ICD-10-CM

## 2013-05-07 NOTE — Assessment & Plan Note (Signed)
Blood pressure has been remaining stable on current medication. 

## 2013-05-07 NOTE — Assessment & Plan Note (Signed)
The patient has known mild to moderate mitral regurgitation.  She is salt sensitive.  When she develops increased dyspnea she knows to take extra Lasix and it is normally successful promptly.

## 2013-05-07 NOTE — Assessment & Plan Note (Signed)
The patient has chronic atrial fibrillation.  She is on long-term Coumadin.  She has had no TIA symptoms.

## 2013-05-07 NOTE — Progress Notes (Signed)
Kristen Whitehead Date of Birth:  August 14, 1923 [facility] [address] [place], [place]  [address] [phone]         Fax   [phone]  History of Present Illness: This pleasant 79 year old woman is seen for a scheduled followup office visit. She has a history of established atrial fibrillation. She does not have any history of ischemic heart disease. She had a normal Myoview stress test in January 2011. [name] saw her as a work in on 03/12/12 and she had another Myoview on 03/18/12 which showed no evidence of ischemia and her ejection fraction was 75%. In January 2014 she was seen by [name]. She subsequently had an update of her echocardiogram on 09/17/12 which showed an ejection fraction of 60-65% and biatrial enlargement. There was mild mitral regurgitation.  The patient has a history of compensated diastolic congestive heart failure.  Dr. Catha Gosselin did a B. natruretic peptide on 01/27/13 which was elevated at 443 the patient had electrolytes drawn on 03/04/13 showing BUN of 8 and creatinine 0.61 On Sunday, August 3 the patient went to urgent medical care Center because of abdominal pain and was evaluated.  She was found to have elevated liver function studies with AST of 217 and ALT of 125.  She saw her gastroenterologist Dr. Matthias Hughs subsequently who ordered a CT scan of the abdomen.    The patient had a chest x-ray on 83/14 which showed normal heart size and clear lungs. Generally she does not have much chest discomfort.  Last week she did have to take 3 sublingual nitroglycerin for one episode of discomfort with eventual relief.    Current Outpatient Prescriptions  Medication Sig Dispense Refill  . amLODipine (NORVASC) 5 MG tablet Take 0.5 tablets (2.5 mg total) by mouth daily. 1/2 tablet once a day  45 tablet  3  . atenolol (TENORMIN) 25 MG tablet TAKE 1 TABLET  TWO  TO THREE TIMES DAILY AS DIRECTED  260 tablet  3  . beta carotene w/minerals (OCUVITE) tablet  Take 1 tablet by mouth daily.      . calcium carbonate (OS-CAL - DOSED IN MG OF ELEMENTAL CALCIUM) 1250 MG tablet Take 1 tablet by mouth daily.      . Cholecalciferol (VITAMIN D) 2000 UNITS tablet Take 2,000 Units by mouth daily.       Marland Kitchen estradiol (ESTRACE VAGINAL) 0.1 MG/GM vaginal cream Place 2 g vaginally 2 (two) times a week.       . furosemide (LASIX) 20 MG tablet Take 2 tablets (40 mg total) by mouth every other day. Alternates between 20 and 40 mg  135 tablet  1  . isosorbide mononitrate (IMDUR) 30 MG 24 hr tablet Take 1 tablet (30 mg total) by mouth daily.  90 tablet  3  . latanoprost (XALATAN) 0.005 % ophthalmic solution Place 1 drop into the left eye at bedtime.       Marland Kitchen levothyroxine (SYNTHROID, LEVOTHROID) 75 MCG tablet Take 75 mcg by mouth daily.        . nitroGLYCERIN (NITROSTAT) 0.4 MG SL tablet Place 1 tablet (0.4 mg total) under the tongue every 5 (five) minutes x 3 doses as needed. For chest pain.  25 tablet  5  . potassium chloride (K-DUR) 10 MEQ tablet Take 1 tablet (10 mEq total) by mouth daily.  30 tablet  11  . pramipexole (MIRAPEX) 0.125 MG tablet 1 pill each night for 1 week, the 2 pills each night thereafter.  60 tablet  5  . temazepam (RESTORIL) 15 MG capsule Take 15 mg by mouth at bedtime.      . vitamin C (ASCORBIC ACID) 500 MG tablet Take 500 mg by mouth daily.      Marland Kitchen warfarin (COUMADIN) 5 MG tablet Take as directed by anticoagulation clinic  90 tablet  1   No current facility-administered medications for this visit.    Allergies  Allergen Reactions  . Demerol   . Ebastine     EBS  . Ibuprofen   . Statins     myalgias  . Tetanus Toxoids   . Tetracyclines & Related     Patient Active Problem List   Diagnosis Date Noted  . Chest pain at rest 03/12/2012  . Cough 09/16/2011  . Fatigue 04/08/2011  . Benign hypertensive heart disease without heart failure 02/11/2011  . Mitral regurgitation 02/11/2011  . Hypothyroidism 02/11/2011  . Hypercholesterolemia  02/11/2011  . Atrial fibrillation 12/03/2010    History  Smoking status  . Never Smoker   Smokeless tobacco  . Never Used    History  Alcohol Use No    Family History  Problem Relation Age of Onset  . Heart attack Neg Hx   . Heart disease Neg Hx   . Stroke Mother   . Stroke Father     Review of Systems: Constitutional: no fever chills diaphoresis or fatigue or change in weight.  Head and neck: no hearing loss, no epistaxis, no photophobia or visual disturbance. Respiratory: No cough, shortness of breath or wheezing. Cardiovascular: No chest pain peripheral edema, palpitations. Gastrointestinal: No abdominal distention, no abdominal pain, no change in bowel habits hematochezia or melena. Genitourinary: No dysuria, no frequency, no urgency, no nocturia. Musculoskeletal:No arthralgias, no back pain, no gait disturbance or myalgias. Neurological: No dizziness, no headaches, no numbness, no seizures, no syncope, no weakness, no tremors. Hematologic: No lymphadenopathy, no easy bruising. Psychiatric: No confusion, no hallucinations, no sleep disturbance.    Physical Exam: Filed Vitals:   05/07/13 1154  BP: 142/78  Pulse: 85   the general appearance reveals a well-developed well-nourished woman in no acute distress.The head and neck exam reveals pupils equal and reactive.  Extraocular movements are full.  There is no scleral icterus.  The mouth and pharynx are normal.  The neck is supple.  The carotids reveal no bruits.  The jugular venous pressure is normal.  The  thyroid is not enlarged.  There is no lymphadenopathy.  The chest is clear to percussion and auscultation.  There are no rales or rhonchi.  Expansion of the chest is symmetrical.  The precordium is quiet.  The first heart sound is normal.  The second heart sound is physiologically split.  There is no murmur gallop rub or click.  There is no abnormal lift or heave.  The abdomen is soft and nontender.  The bowel sounds are  normal.  The liver and spleen are not enlarged.  There are no abdominal masses.  There are no abdominal bruits.  Extremities reveal good pedal pulses.  There is no phlebitis or edema.  There is no cyanosis or clubbing.  Strength is normal and symmetrical in all extremities.  There is no lateralizing weakness.  There are no sensory deficits.  The skin is warm and dry.  There is no rash.     Assessment / Plan: She is to continue same medication.  Current dose of Lasix appears to be appropriate.  When she has increased dyspnea  she knows to take an extra Lasix as necessary until the dyspnea returns to baseline.  Her recent chest x-ray showed no evidence of failure and her heart size is normal and she has no peripheral edema. Recheck here in 3 months for followup office visit and EKG

## 2013-05-07 NOTE — Patient Instructions (Addendum)
Your physician recommends that you continue on your current medications as directed. Please refer to the Current Medication list given to you today.  Your physician recommends that you schedule a follow-up appointment in: 3 MONTH OV/EKG  

## 2013-05-13 ENCOUNTER — Telehealth: Payer: Self-pay | Admitting: Neurology

## 2013-05-14 NOTE — Telephone Encounter (Signed)
Spoke with patient and relayed the results of her EMG from 03/03/13.  I reviewed Dr Teofilo Pod findings with her from her OV on 02/17/2013.  Kristen Whitehead acknowledged the information and had no further questions.

## 2013-05-25 ENCOUNTER — Ambulatory Visit: Payer: Medicare PPO | Admitting: Neurology

## 2013-05-28 ENCOUNTER — Ambulatory Visit (INDEPENDENT_AMBULATORY_CARE_PROVIDER_SITE_OTHER): Payer: Medicare PPO | Admitting: *Deleted

## 2013-05-28 DIAGNOSIS — Z7901 Long term (current) use of anticoagulants: Secondary | ICD-10-CM

## 2013-05-28 DIAGNOSIS — I4891 Unspecified atrial fibrillation: Secondary | ICD-10-CM

## 2013-05-28 LAB — POCT INR: INR: 2.7

## 2013-06-09 ENCOUNTER — Other Ambulatory Visit: Payer: Self-pay | Admitting: Cardiology

## 2013-06-24 ENCOUNTER — Other Ambulatory Visit: Payer: Self-pay

## 2013-06-28 ENCOUNTER — Encounter: Payer: Self-pay | Admitting: Neurology

## 2013-06-28 ENCOUNTER — Ambulatory Visit (INDEPENDENT_AMBULATORY_CARE_PROVIDER_SITE_OTHER): Payer: Medicare PPO | Admitting: Neurology

## 2013-06-28 VITALS — BP 126/71 | HR 80 | Temp 98.4°F | Ht 59.0 in | Wt 145.0 lb

## 2013-06-28 DIAGNOSIS — G609 Hereditary and idiopathic neuropathy, unspecified: Secondary | ICD-10-CM

## 2013-06-28 DIAGNOSIS — G2581 Restless legs syndrome: Secondary | ICD-10-CM

## 2013-06-28 NOTE — Progress Notes (Signed)
Subjective:    Patient ID: Kristen Whitehead is a 79 y.o. female.  HPI  Interim history:   Kristen Whitehead is a very pleasant 79 year old right-handed woman with an underlying medical history of hypertension, hyperlipidemia, atrial fibrillation, celiac disease, scoliosis, reflux disease, who presents for followup consultation of her bilateral foot pain. She is unaccompanied today. I first met her on 02/17/2013, to which time I suggested EMG and nerve conduction testing and a treatment trial with Mirapex because of possible underlying RLS. She has previously seen a podiatrist who has done I nerve biopsy which she reported as normal. She has a history of A. fib. She has a history of congestive heart failure and is followed by cardiology. Echocardiogram in January of this year showed biatrial enlargement and mild mitral regurgitation and an EF of 60-65% with diastolic dysfunction.  For years she has had burning in her feet and some months ago she started having a crawling sensation in her distal legs. She has also had cramping in her muscles for years and she was taken off of her statin about a year ago and her cramping has lately been somewhat better. She has had pins and needles sensation and it helps to walk around. She has worse symptoms at night or by the end of the day. She remembers having "terrible growing pains" as a child and does not have a FHx of RLS as far as she knows. She had nerve biopsies both legs by her podiatrist, which did not show inflammation. While she has a history of chronic back pain she denies any radiating back pain to her legs. In fact she described pins and needle and crawling sensation in the distal lower extremities, radiating up. She has been taking OTC Mg supplements, which helped her cramps.  Her EMG and nerve conduction testing from 03/03/2013 showed no evidence of peripheral neuropathy. We called her with her test results. Her foot pain is better and the paresthesias are  better. She has not had any SEs for Mirapex, which is now at 0.25 mg each night. She does not have much in the way of improvement in her sleep, however. She had a skin cancer removed in the L mid face recently. She no longer feels an excruciating pain.  Her Past Medical History Is Significant For: Past Medical History  Diagnosis Date  . History of mitral valve prolapse 06/15/2008    a. echo 1/14: mild LVH, EF 60-65%, mild MR, mild to mod BAE, PASP 35  . Chronic atrial fibrillation     managed with rate control and coumadin  . History of congenital mitral regurgitation     mild  . Hypothyroidism   . Hypercholesterolemia   . GERD (gastroesophageal reflux disease)   . Chest pain     no known ischemic heart disease; negative Myoview July 2013. EF 75% with no ischemia.   . Hypertension   . Bladder prolapse, female, acquired   . Chronic diastolic heart failure   . Chronic anticoagulation   . Heart disease   . Cancer     thyroid    Her Past Surgical History Is Significant For: Past Surgical History  Procedure Laterality Date  . Thyroidectomy    . Cholecystectomy    . Abdominal hysterectomy      Her Family History Is Significant For: Family History  Problem Relation Age of Onset  . Heart attack Neg Hx   . Heart disease Neg Hx   . Stroke Mother   .  Stroke Father     Her Social History Is Significant For: History   Social History  . Marital Status: Widowed    Spouse Name: N/A    Number of Children: 3  . Years of Education: BA   Occupational History  .     Social History Main Topics  . Smoking status: Never Smoker   . Smokeless tobacco: Never Used  . Alcohol Use: No  . Drug Use: No  . Sexual Activity: No   Other Topics Concern  . None   Social History Narrative   Pt lives at home alone.   Caffeine Use: quit 71yrs ago    Her Allergies Are:  Allergies  Allergen Reactions  . Demerol   . Ebastine     EBS  . Ibuprofen   . Statins     myalgias  . Tetanus  Toxoids   . Tetracyclines & Related   :   Her Current Medications Are:  Outpatient Encounter Prescriptions as of 06/28/2013  Medication Sig  . amLODipine (NORVASC) 5 MG tablet Take 0.5 tablets (2.5 mg total) by mouth daily. 1/2 tablet once a day  . atenolol (TENORMIN) 25 MG tablet TAKE 1 TABLET  TWO  TO THREE TIMES DAILY AS DIRECTED  . beta carotene w/minerals (OCUVITE) tablet Take 1 tablet by mouth daily.  . calcium carbonate (OS-CAL - DOSED IN MG OF ELEMENTAL CALCIUM) 1250 MG tablet Take 1 tablet by mouth daily.  . Cholecalciferol (VITAMIN D) 2000 UNITS tablet Take 2,000 Units by mouth daily.   Marland Kitchen estradiol (ESTRACE VAGINAL) 0.1 MG/GM vaginal cream Place 2 g vaginally 2 (two) times a week.   . fluocinonide cream (LIDEX) 0.05 %   . FLUZONE HIGH-DOSE injection   . furosemide (LASIX) 20 MG tablet Take 2 tablets (40 mg total) by mouth every other day. Alternates between 20 and 40 mg  . HYDROcodone-acetaminophen (NORCO/VICODIN) 5-325 MG per tablet Take 1 tablet by mouth as needed.  . isosorbide mononitrate (IMDUR) 30 MG 24 hr tablet Take 1 tablet (30 mg total) by mouth daily.  Marland Kitchen latanoprost (XALATAN) 0.005 % ophthalmic solution Place 1 drop into the left eye at bedtime.   Marland Kitchen levothyroxine (SYNTHROID, LEVOTHROID) 75 MCG tablet Take 75 mcg by mouth daily.    . nitroGLYCERIN (NITROSTAT) 0.4 MG SL tablet Place 1 tablet (0.4 mg total) under the tongue every 5 (five) minutes x 3 doses as needed. For chest pain.  . potassium chloride (K-DUR) 10 MEQ tablet Take 1 tablet (10 mEq total) by mouth daily.  . pramipexole (MIRAPEX) 0.125 MG tablet 1 pill each night for 1 week, the 2 pills each night thereafter.  . temazepam (RESTORIL) 15 MG capsule Take 15 mg by mouth at bedtime.  . vitamin C (ASCORBIC ACID) 500 MG tablet Take 500 mg by mouth daily.  Marland Kitchen warfarin (COUMADIN) 5 MG tablet TAKE AS DIRECTED BY ANTICOAGULATION CLINIC  :  Review of Systems:  Out of a complete 14 point review of systems, all are  reviewed and negative with the exception of these symptoms as listed below:  Review of Systems  Constitutional: Positive for unexpected weight change.  HENT: Negative.   Eyes: Negative.   Respiratory: Negative.   Cardiovascular: Negative.   Gastrointestinal: Negative.   Endocrine: Negative.   Genitourinary: Negative.   Musculoskeletal: Positive for joint swelling.  Skin: Negative.   Allergic/Immunologic: Negative.   Neurological: Negative.   Hematological: Negative.   Psychiatric/Behavioral: Positive for sleep disturbance.    Objective:  Neurologic Exam  Physical Exam Physical Examination:   Filed Vitals:   06/28/13 1501  BP: 126/71  Pulse: 80  Temp: 98.4 F (36.9 C)    General Examination: The patient is a very pleasant 79 y.o. female in no acute distress. She appears well-developed and well-nourished and well groomed.   HEENT: Normocephalic, atraumatic, pupils are equal, round and reactive to light and accommodation. Extraocular tracking is good without limitation to gaze excursion or nystagmus noted. Normal smooth pursuit is noted. Hearing is grossly intact. Face is symmetric with normal facial animation and normal facial sensation. Speech is clear with no dysarthria noted. There is no hypophonia. There is no lip, neck/head, jaw or voice tremor. Neck is supple with full range of passive and active motion. There are no carotid bruits on auscultation. Oropharynx exam reveals: mild mouth dryness, adequate dental hygiene and no airway crowding. Tongue protrudes centrally and palate elevates symmetrically.    Chest: Clear to auscultation without wheezing, rhonchi or crackles noted.  Heart: S1+S2+0, regular and normal without murmurs, rubs or gallops noted.   Abdomen: Soft, non-tender and non-distended with normal bowel sounds appreciated on auscultation.  Extremities: There is 1+ pitting edema in the distal lower extremities bilaterally. Pedal pulses are intact.  Skin: Warm  and dry without trophic changes noted. There are no varicose veins.  Musculoskeletal: exam reveals no obvious joint deformities, tenderness or joint swelling or erythema.   Neurologically:  Mental status: The patient is awake, alert and oriented in all 4 spheres. Her memory, attention, language and knowledge are appropriate. There is no aphasia, agnosia, apraxia or anomia. Speech is clear with normal prosody and enunciation. Thought process is linear. Mood is congruent and affect is normal.  Cranial nerves are as described above under HEENT exam. In addition, shoulder shrug is normal with equal shoulder height noted. Motor exam: Normal bulk, strength and tone is noted. There is no drift, tremor or rebound. Romberg is negative. Reflexes are 1+ in the UEs and trace in the knees and absent in both ankles. Toes are downgoing bilaterally. Fine motor skills are intact with normal finger taps, normal hand movements, normal rapid alternating patting, normal foot taps and normal foot agility.  Cerebellar testing shows no dysmetria or intention tremor on finger to nose testing. There is no truncal or gait ataxia.  Sensory exam is intact to light touch, pinprick, vibration, temperature sense in the upper extremities, but mildly decreased to PP and temp and to a lesser degree to vibration sense in the distal LEs.  Gait, station and balance: she stands with no significant difficulty. No veering to one side is noted. No leaning to one side is noted. Posture is age-appropriate and stance is narrow based. No problems turning are noted. Her feet tend to point outward while walking.  Assessment and Plan:   In summary, ORAH SONNEN is a very pleasant 79 year old female with a history of leg and foot pain. Her physical exam is consistent with mild neuropathy in the absence of a Hx of DM with normal HBA1c recently and normal B12. I still believe, there is a significant component of restless leg syndrome. Her EMG and  nerve conduction study as well as blood work did not show any evidence of significant neuropathy. She has done fairly well with Mirapex. She denies any side effects and feels improved as far as her foot pain and her paresthesias are concerned. She is on 0.25 mg each night and tolerates this well and reports  no new problems today. Her exam is stable. I do not think we need to do any additional testing at this time. I would like for her to continue Mirapex at this dose. She did not need a refill today. Most of her 30 minute visit with me was spent in counseling and coordination of care, reviewing test results. Since she is doing better would like to see her back in 6 months from now, sooner if the need arises and she is encouraged to call with any interim questions, concerns, problems or updates and refill requests. She was in agreement.

## 2013-06-28 NOTE — Patient Instructions (Addendum)
I think overall you are doing fairly well but I do want to suggest a few things today:  Remember to drink plenty of fluid, eat healthy meals and do not skip any meals. Try to eat protein with a every meal and eat a healthy snack such as fruit or nuts in between meals. Try to keep a regular sleep-wake schedule and try to exercise daily, particularly in the form of walking, 20-30 minutes a day, if you can.   As far as your medications are concerned, I would like to suggest no new changes. Continue pramipexole 0.125 mg 2 pills each night for now.   As far as diagnostic testing: no new test.   I would like to see you back in 6 months, sooner if we need to. Please call us with any interim questions, concerns, problems, updates or refill requests.  Brett Canales is my clinical assistant and will answer any of your questions and relay your messages to me and also relay most of my messages to you.  Our phone number is [phone]. We also have an after hours call service for urgent matters and there is a physician on-call for urgent questions. For any emergencies you know to call 911 or go to the nearest emergency room.

## 2013-06-29 ENCOUNTER — Ambulatory Visit (INDEPENDENT_AMBULATORY_CARE_PROVIDER_SITE_OTHER): Payer: Medicare PPO | Admitting: Pharmacist

## 2013-06-29 DIAGNOSIS — I4891 Unspecified atrial fibrillation: Secondary | ICD-10-CM

## 2013-06-29 DIAGNOSIS — Z7901 Long term (current) use of anticoagulants: Secondary | ICD-10-CM

## 2013-06-30 ENCOUNTER — Ambulatory Visit: Payer: Medicare PPO | Admitting: Cardiology

## 2013-07-07 ENCOUNTER — Telehealth: Payer: Self-pay | Admitting: Cardiology

## 2013-07-07 MED ORDER — AMLODIPINE BESYLATE 2.5 MG PO TABS: 2.5000 mg | ORAL_TABLET | Freq: Every day | ORAL | Status: DC

## 2013-07-07 NOTE — Telephone Encounter (Signed)
Sent Rx for 2.5 mg as requested

## 2013-07-07 NOTE — Telephone Encounter (Signed)
FYI    Pt called to inform Dr. Hardin Negus will be faxing a Presctision doseage (mg) change on  Norvasc 5mg  they have a 2.5mg  one so that she does not have to cut the 5 mg in half.  pt would like this.

## 2013-07-17 ENCOUNTER — Other Ambulatory Visit: Payer: Self-pay

## 2013-07-17 DIAGNOSIS — G2581 Restless legs syndrome: Secondary | ICD-10-CM

## 2013-07-17 DIAGNOSIS — G609 Hereditary and idiopathic neuropathy, unspecified: Secondary | ICD-10-CM

## 2013-07-17 MED ORDER — PRAMIPEXOLE DIHYDROCHLORIDE 0.125 MG PO TABS: 0.2500 mg | ORAL_TABLET | Freq: Every evening | ORAL | Status: DC

## 2013-08-03 ENCOUNTER — Ambulatory Visit (INDEPENDENT_AMBULATORY_CARE_PROVIDER_SITE_OTHER): Payer: Medicare HMO | Admitting: Pharmacist

## 2013-08-03 ENCOUNTER — Encounter: Payer: Self-pay | Admitting: Nurse Practitioner

## 2013-08-03 ENCOUNTER — Ambulatory Visit (INDEPENDENT_AMBULATORY_CARE_PROVIDER_SITE_OTHER): Payer: Medicare HMO | Admitting: Nurse Practitioner

## 2013-08-03 VITALS — BP 160/82 | HR 66 | Ht 59.0 in | Wt 150.0 lb

## 2013-08-03 DIAGNOSIS — R5383 Other fatigue: Secondary | ICD-10-CM

## 2013-08-03 DIAGNOSIS — I5032 Chronic diastolic (congestive) heart failure: Secondary | ICD-10-CM

## 2013-08-03 DIAGNOSIS — R0609 Other forms of dyspnea: Secondary | ICD-10-CM

## 2013-08-03 DIAGNOSIS — I4891 Unspecified atrial fibrillation: Secondary | ICD-10-CM

## 2013-08-03 DIAGNOSIS — R5381 Other malaise: Secondary | ICD-10-CM

## 2013-08-03 DIAGNOSIS — R0600 Dyspnea, unspecified: Secondary | ICD-10-CM

## 2013-08-03 DIAGNOSIS — Z7901 Long term (current) use of anticoagulants: Secondary | ICD-10-CM

## 2013-08-03 LAB — BASIC METABOLIC PANEL
BUN: 13 mg/dL (ref 6–23)
CO2: 29 mEq/L (ref 19–32)
Calcium: 8.3 mg/dL — ABNORMAL LOW (ref 8.4–10.5)
Chloride: 103 mEq/L (ref 96–112)
Creatinine, Ser: 0.6 mg/dL (ref 0.4–1.2)
GFR: 102.07 mL/min (ref >60.00)
Glucose, Bld: 86 mg/dL (ref 70–99)
Potassium: 3.9 mEq/L (ref 3.5–5.1)
Sodium: 138 mEq/L (ref 135–145)

## 2013-08-03 LAB — CBC WITH DIFFERENTIAL/PLATELET
Basophils Absolute: 0 10*3/uL (ref 0.0–0.1)
Basophils Relative: 0.5 % (ref 0.0–3.0)
Eosinophils Absolute: 0.2 10*3/uL (ref 0.0–0.7)
Eosinophils Relative: 4.2 % (ref 0.0–5.0)
HCT: 41.2 % (ref 36.0–46.0)
Hemoglobin: 13.6 g/dL (ref 12.0–15.0)
Lymphocytes Relative: 33 % (ref 12.0–46.0)
Lymphs Abs: 1.8 10*3/uL (ref 0.7–4.0)
MCHC: 33.2 g/dL (ref 30.0–36.0)
MCV: 92.8 fl (ref 78.0–100.0)
Monocytes Absolute: 0.7 10*3/uL (ref 0.1–1.0)
Monocytes Relative: 12.4 % — ABNORMAL HIGH (ref 3.0–12.0)
Neutro Abs: 2.7 10*3/uL (ref 1.4–7.7)
Neutrophils Relative %: 49.9 % (ref 43.0–77.0)
Platelets: 237 10*3/uL (ref 150.0–400.0)
RBC: 4.43 Mil/uL (ref 3.87–5.11)
RDW: 15.1 % — ABNORMAL HIGH (ref 11.5–14.6)
WBC: 5.4 10*3/uL (ref 4.5–10.5)

## 2013-08-03 LAB — HEPATIC FUNCTION PANEL
ALT: 23 U/L (ref 0–35)
AST: 29 U/L (ref 0–37)
Albumin: 3.7 g/dL (ref 3.5–5.2)
Alkaline Phosphatase: 61 U/L (ref 39–117)
Bilirubin, Direct: 0 mg/dL (ref 0.0–0.3)
Total Bilirubin: 0.6 mg/dL (ref 0.3–1.2)
Total Protein: 6.7 g/dL (ref 6.0–8.3)

## 2013-08-03 LAB — TSH: TSH: 2.57 u[IU]/mL (ref 0.35–5.50)

## 2013-08-03 LAB — BRAIN NATRIURETIC PEPTIDE: Pro B Natriuretic peptide (BNP): 362 pg/mL — ABNORMAL HIGH (ref 0.0–100.0)

## 2013-08-03 NOTE — Progress Notes (Signed)
Kristen Whitehead Date of Birth: 01-18-1923 Medical Record #[id number]  History of Present Illness: [name] is seen back today for her 3 month check. Seen for Dr. Patty Sermons. She has chronic atrial fib. No known ischemic heart disease. Normal Myoview back in July of 2013 with no evidence of ischemia and a normal EF of 75%. Echo updated in January with a normal EF of 60 to 65% and biatrial enlargement with mild MR. Other issues include diastolic dysfunction, past elevated LFTs and advanced age.   Last seen in September and seemed to be doing ok.   Comes back today. Here alone. Doing ok. No chest pain. Says she gets short of breath. More fatigued. Not as active as she has been - her dog has died. She tires easily with walking and admits that she is just not doing very much but wanting to know why she is tired. Does not sound like she restricts her salt. Wears her support stockings. Some palpitations. No falls. No syncope. She is 79 years of age.   Current Outpatient Prescriptions  Medication Sig Dispense Refill  . amLODipine (NORVASC) 2.5 MG tablet Take 1 tablet (2.5 mg total) by mouth daily.  90 tablet  3  . atenolol (TENORMIN) 25 MG tablet THREE TIMES DAILY AS DIRECTED      . beta carotene w/minerals (OCUVITE) tablet Take 1 tablet by mouth daily.      . calcium carbonate (OS-CAL - DOSED IN MG OF ELEMENTAL CALCIUM) 1250 MG tablet Take 1 tablet by mouth daily.      . Cholecalciferol (VITAMIN D) 2000 UNITS tablet Take 2,000 Units by mouth daily.       Marland Kitchen estradiol (ESTRACE VAGINAL) 0.1 MG/GM vaginal cream Place 2 g vaginally 2 (two) times a week.       . fluocinonide cream (LIDEX) 0.05 %       . FLUZONE HIGH-DOSE injection       . furosemide (LASIX) 20 MG tablet Take 2 tablets (40 mg total) by mouth every other day. Alternates between 20 and 40 mg  135 tablet  1  . HYDROcodone-acetaminophen (NORCO/VICODIN) 5-325 MG per tablet Take 1 tablet by mouth as needed.      . isosorbide mononitrate  (IMDUR) 30 MG 24 hr tablet Take 1 tablet (30 mg total) by mouth daily.  90 tablet  3  . latanoprost (XALATAN) 0.005 % ophthalmic solution Place 1 drop into the left eye at bedtime.       Marland Kitchen levothyroxine (SYNTHROID, LEVOTHROID) 75 MCG tablet Take 75 mcg by mouth daily.        . nitroGLYCERIN (NITROSTAT) 0.4 MG SL tablet Place 1 tablet (0.4 mg total) under the tongue every 5 (five) minutes x 3 doses as needed. For chest pain.  25 tablet  5  . potassium chloride (K-DUR) 10 MEQ tablet Take 1 tablet (10 mEq total) by mouth daily.  30 tablet  11  . pramipexole (MIRAPEX) 0.125 MG tablet Take 2 tablets (0.25 mg total) by mouth every evening.  180 tablet  1  . temazepam (RESTORIL) 15 MG capsule Take 15 mg by mouth at bedtime.      . vitamin C (ASCORBIC ACID) 500 MG tablet Take 500 mg by mouth daily.      Marland Kitchen warfarin (COUMADIN) 5 MG tablet TAKE AS DIRECTED BY ANTICOAGULATION CLINIC  90 tablet  1   No current facility-administered medications for this visit.    Allergies  Allergen Reactions  . Demerol   .  Ebastine     EBS  . Ibuprofen   . Statins     myalgias  . Tetanus Toxoids   . Tetracyclines & Related     Past Medical History  Diagnosis Date  . History of mitral valve prolapse 06/15/2008    a. echo 1/14: mild LVH, EF 60-65%, mild MR, mild to mod BAE, PASP 35  . Chronic atrial fibrillation     managed with rate control and coumadin  . History of congenital mitral regurgitation     mild  . Hypothyroidism   . Hypercholesterolemia   . GERD (gastroesophageal reflux disease)   . Chest pain     no known ischemic heart disease; negative Myoview July 2013. EF 75% with no ischemia.   . Hypertension   . Bladder prolapse, female, acquired   . Chronic diastolic heart failure   . Chronic anticoagulation   . Heart disease   . Cancer     thyroid    Past Surgical History  Procedure Laterality Date  . Thyroidectomy    . Cholecystectomy    . Abdominal hysterectomy      History  Smoking  status  . Never Smoker   Smokeless tobacco  . Never Used    History  Alcohol Use No    Family History  Problem Relation Age of Onset  . Heart attack Neg Hx   . Heart disease Neg Hx   . Stroke Mother   . Stroke Father     Review of Systems: The review of systems is per the HPI.  All other systems were reviewed and are negative.  Physical Exam: BP 160/82  Pulse 66  Ht 4\' 11"  (1.499 m)  Wt 150 lb (68.04 kg)  BMI 30.28 kg/m2  SpO2 96% Patient is a very elderly female who is in no acute distress. Skin is warm and dry. Color is normal.  HEENT is unremarkable. Normocephalic/atraumatic. PERRL. Sclera are nonicteric. Neck is supple. No masses. No JVD. Lungs are clear. Cardiac exam shows an irregular rhythm. Rate is ok. Abdomen is soft. Extremities are without edema. Gait and ROM are intact. No gross neurologic deficits noted.  Wt Readings from Last 3 Encounters:  08/03/13 150 lb (68.04 kg)  06/28/13 145 lb (65.772 kg)  05/07/13 142 lb 12.8 oz (64.774 kg)     LABORATORY DATA:  Lab Results  Component Value Date   WBC 5.6 03/21/2013   HGB 14.5 03/21/2013   HCT 44.8 03/21/2013   PLT 245 02/17/2013   GLUCOSE 100* 03/21/2013   CHOL 211* 11/04/2012   TRIG 201.0* 11/04/2012   HDL 66.80 11/04/2012   LDLDIRECT 98.8 11/04/2012   LDLCALC 109* 04/16/2012   ALT 125* 03/21/2013   AST 217* 03/21/2013   NA 138 03/21/2013   K 3.7 03/21/2013   CL 102 03/21/2013   CREATININE 0.68 03/21/2013   BUN 10 03/21/2013   CO2 31 03/21/2013   TSH 0.145* 03/21/2013   INR 3.0 08/03/2013    Echo Study Conclusions from January 2014  - Left ventricle: The cavity size was normal. Wall thickness was increased in a pattern of mild LVH. Systolic function was normal. The estimated ejection fraction was in the range of 60% to 65%. - Mitral valve: Mild regurgitation. - Left atrium: The atrium was mildly to moderately dilated. - Right atrium: The atrium was mildly to moderately dilated. - Pulmonary arteries: PA peak pressure:  35mm Hg (S).   Assessment / Plan: 1. Chronic atrial fib - managed with  rate control and coumadin.   2. Chronic anticoagulation - no adverse effects noted.   3. Diastolic HF - seems compensated but with a multitude of complaints - will recheck her labs today. Try to be as active as possible. No change in medicines for now.   Patient is agreeable to this plan and will call if any problems develop in the interim.   Rosalio Macadamia, RN, ANP-C [facility] [address] [place], [place]  [address]

## 2013-08-03 NOTE — Patient Instructions (Signed)
We need to check lab today  For now, stay on your current medicines  Try to be as active as possible  See Dr. Patty Sermons back in 3 months  Call the [facility] office at [phone] if you have any questions, problems or concerns.

## 2013-08-04 ENCOUNTER — Ambulatory Visit: Payer: Medicare HMO | Admitting: Nurse Practitioner

## 2013-08-06 ENCOUNTER — Ambulatory Visit: Payer: Medicare PPO | Admitting: Cardiology

## 2013-08-06 ENCOUNTER — Other Ambulatory Visit: Payer: Self-pay | Admitting: Cardiology

## 2013-08-06 ENCOUNTER — Ambulatory Visit: Payer: Commercial Managed Care - HMO | Admitting: Nurse Practitioner

## 2013-08-09 ENCOUNTER — Other Ambulatory Visit: Payer: Self-pay | Admitting: Internal Medicine

## 2013-08-09 DIAGNOSIS — C73 Malignant neoplasm of thyroid gland: Secondary | ICD-10-CM

## 2013-08-16 ENCOUNTER — Other Ambulatory Visit: Payer: Medicare HMO

## 2013-09-02 ENCOUNTER — Ambulatory Visit (INDEPENDENT_AMBULATORY_CARE_PROVIDER_SITE_OTHER): Payer: Medicare HMO | Admitting: *Deleted

## 2013-09-02 DIAGNOSIS — Z7901 Long term (current) use of anticoagulants: Secondary | ICD-10-CM

## 2013-09-02 DIAGNOSIS — I4891 Unspecified atrial fibrillation: Secondary | ICD-10-CM

## 2013-09-02 LAB — POCT INR: INR: 2.2

## 2013-09-15 ENCOUNTER — Telehealth: Payer: Self-pay | Admitting: Cardiology

## 2013-09-15 NOTE — Telephone Encounter (Signed)
New problem   Pt is bruising and need to know if she need to come in. Please call pt.

## 2013-09-15 NOTE — Telephone Encounter (Signed)
Spoke with pt, she has had some bruises noted on her hands and is concern, wants INR checked tomorrow. She has not had any change in meds, diet, diarrhea or N&V. Appt S/C for 09/16/13.

## 2013-09-16 ENCOUNTER — Ambulatory Visit
Admission: RE | Admit: 2013-09-16 | Discharge: 2013-09-16 | Disposition: A | Discharge status: Home / Self Care | Payer: Commercial Managed Care - HMO | Source: Ambulatory Visit | Attending: Internal Medicine | Admitting: Internal Medicine

## 2013-09-16 ENCOUNTER — Ambulatory Visit (INDEPENDENT_AMBULATORY_CARE_PROVIDER_SITE_OTHER): Payer: Medicare HMO | Admitting: *Deleted

## 2013-09-16 DIAGNOSIS — I4891 Unspecified atrial fibrillation: Secondary | ICD-10-CM

## 2013-09-16 DIAGNOSIS — Z7901 Long term (current) use of anticoagulants: Secondary | ICD-10-CM

## 2013-09-16 DIAGNOSIS — Z5181 Encounter for therapeutic drug level monitoring: Secondary | ICD-10-CM

## 2013-09-16 DIAGNOSIS — C73 Malignant neoplasm of thyroid gland: Secondary | ICD-10-CM

## 2013-09-16 LAB — POCT INR: INR: 3.5

## 2013-09-29 ENCOUNTER — Other Ambulatory Visit: Payer: Self-pay | Admitting: Cardiology

## 2013-10-13 ENCOUNTER — Other Ambulatory Visit: Payer: Self-pay | Admitting: Cardiology

## 2013-10-13 ENCOUNTER — Ambulatory Visit (INDEPENDENT_AMBULATORY_CARE_PROVIDER_SITE_OTHER): Payer: Commercial Managed Care - HMO | Admitting: Pharmacist

## 2013-10-13 DIAGNOSIS — Z5181 Encounter for therapeutic drug level monitoring: Secondary | ICD-10-CM | POA: Diagnosis not present

## 2013-10-13 DIAGNOSIS — Z7901 Long term (current) use of anticoagulants: Secondary | ICD-10-CM | POA: Diagnosis not present

## 2013-10-13 DIAGNOSIS — I4891 Unspecified atrial fibrillation: Secondary | ICD-10-CM | POA: Diagnosis not present

## 2013-10-13 LAB — POCT INR: INR: 3.2

## 2013-10-18 ENCOUNTER — Encounter (INDEPENDENT_AMBULATORY_CARE_PROVIDER_SITE_OTHER): Payer: Medicare PPO | Admitting: Ophthalmology

## 2013-10-18 DIAGNOSIS — H43819 Vitreous degeneration, unspecified eye: Secondary | ICD-10-CM

## 2013-10-18 DIAGNOSIS — I10 Essential (primary) hypertension: Secondary | ICD-10-CM

## 2013-10-18 DIAGNOSIS — H4010X0 Unspecified open-angle glaucoma, stage unspecified: Secondary | ICD-10-CM

## 2013-10-18 DIAGNOSIS — S0550XA Penetrating wound with foreign body of unspecified eyeball, initial encounter: Secondary | ICD-10-CM

## 2013-10-18 DIAGNOSIS — H2700 Aphakia, unspecified eye: Secondary | ICD-10-CM

## 2013-10-18 DIAGNOSIS — H35039 Hypertensive retinopathy, unspecified eye: Secondary | ICD-10-CM

## 2013-10-18 DIAGNOSIS — H35349 Macular cyst, hole, or pseudohole, unspecified eye: Secondary | ICD-10-CM

## 2013-10-27 ENCOUNTER — Encounter: Payer: Self-pay | Admitting: Cardiology

## 2013-10-27 ENCOUNTER — Ambulatory Visit (INDEPENDENT_AMBULATORY_CARE_PROVIDER_SITE_OTHER): Payer: Medicare HMO | Admitting: Cardiology

## 2013-10-27 ENCOUNTER — Ambulatory Visit (INDEPENDENT_AMBULATORY_CARE_PROVIDER_SITE_OTHER): Payer: Medicare HMO | Admitting: *Deleted

## 2013-10-27 VITALS — BP 140/77 | HR 65 | Ht 59.0 in | Wt 150.9 lb

## 2013-10-27 DIAGNOSIS — I5032 Chronic diastolic (congestive) heart failure: Secondary | ICD-10-CM

## 2013-10-27 DIAGNOSIS — Z5181 Encounter for therapeutic drug level monitoring: Secondary | ICD-10-CM

## 2013-10-27 DIAGNOSIS — R5381 Other malaise: Secondary | ICD-10-CM

## 2013-10-27 DIAGNOSIS — I340 Nonrheumatic mitral (valve) insufficiency: Secondary | ICD-10-CM

## 2013-10-27 DIAGNOSIS — I059 Rheumatic mitral valve disease, unspecified: Secondary | ICD-10-CM

## 2013-10-27 DIAGNOSIS — I4891 Unspecified atrial fibrillation: Secondary | ICD-10-CM

## 2013-10-27 DIAGNOSIS — Z7901 Long term (current) use of anticoagulants: Secondary | ICD-10-CM

## 2013-10-27 DIAGNOSIS — R5383 Other fatigue: Secondary | ICD-10-CM

## 2013-10-27 DIAGNOSIS — I119 Hypertensive heart disease without heart failure: Secondary | ICD-10-CM

## 2013-10-27 LAB — POCT INR: INR: 2

## 2013-10-27 NOTE — Assessment & Plan Note (Signed)
The patient is in chronic atrial fibrillation.  She is on long-term Coumadin.  Her blood levels vary from test the test but she still prefers being on Coumadin than on one of the newer agents.

## 2013-10-27 NOTE — Patient Instructions (Signed)
Your physician recommends that you continue on your current medications as directed. Please refer to the Current Medication list given to you today.  Your physician recommends that you schedule a follow-up appointment in: 3 month ov/ekg 

## 2013-10-27 NOTE — Assessment & Plan Note (Signed)
Blood pressure has been remaining stable on current therapy.  She does have trace pretibial and ankle edema

## 2013-10-27 NOTE — Assessment & Plan Note (Signed)
The patient complains of lack of energy.  However she stays quite busy.  She cares for her grandchild or her great-grandchild a regular basis.

## 2013-10-27 NOTE — Assessment & Plan Note (Signed)
The patient has not been experiencing any acute episodes of dyspnea to suggest worsening of her mitral regurgitation.  She does complain of lack of energy which has been a chronic problem.

## 2013-10-27 NOTE — Progress Notes (Signed)
Kristen Whitehead Date of Birth:  02/09/1923 [address] [place], [place]  [address] [phone]         Fax   [phone]  History of Present Illness: This pleasant 80 year old woman is seen for a scheduled followup office visit. She has a history of established atrial fibrillation. She does not have any history of ischemic heart disease. She had a normal Myoview stress test in January 2011. Kristen Whitehead saw her as a work in on 03/12/12 and she had another Myoview on 03/18/12 which showed no evidence of ischemia and her ejection fraction was 75%. In January 2014 she was seen by Kristen Whitehead. She subsequently had an update of her echocardiogram on 09/17/12 which showed an ejection fraction of 60-65% and biatrial enlargement. There was mild mitral regurgitation.  The patient has a history of compensated diastolic congestive heart failure.  [name] did a B. natruretic peptide on 01/27/13 which was elevated at 443 the patient had electrolytes drawn on 03/04/13 showing BUN of 8 and creatinine 0.61 On 03/21/13 the patient went to urgent medical care [facility] because of abdominal pain and was evaluated.  She was found to have elevated liver function studies with AST of 217 and ALT of 125.  She saw her gastroenterologist Dr. Cristina Whitehead subsequently who ordered a CT scan of the abdomen.    The patient had a chest x-ray on 83/14 which showed normal heart size and clear lungs. Since her last saw her she saw her ophthalmologist Dr. Nehemiah Whitehead who noted that the patient had a hole in the retina of her right eye and referred her to Dr. Zigmund Whitehead.  Dr. Zigmund Whitehead has recommended surgery which would be a 90 minute general anesthesia and then she has to keep her head in a certain position for 10 days afterwards.  The patient is undecided at this point as to whether to proceed with the surgery or not.   Current Outpatient Prescriptions  Medication Sig Dispense Refill  . amLODipine (NORVASC) 2.5 MG tablet  Take 1 tablet (2.5 mg total) by mouth daily.  90 tablet  3  . atenolol (TENORMIN) 25 MG tablet THREE TIMES DAILY AS DIRECTED      . beta carotene w/minerals (OCUVITE) tablet Take 1 tablet by mouth daily.      . calcium carbonate (OS-CAL - DOSED IN MG OF ELEMENTAL CALCIUM) 1250 MG tablet Take 1 tablet by mouth daily.      . Cholecalciferol (VITAMIN D) 2000 UNITS tablet Take 2,000 Units by mouth daily.       Marland Kitchen estradiol (ESTRACE VAGINAL) 0.1 MG/GM vaginal cream Place 2 g vaginally 2 (two) times a week.       . fluocinonide cream (LIDEX) 0.05 %       . fluticasone (FLONASE) 50 MCG/ACT nasal spray as needed.       Marland Kitchen FLUZONE HIGH-DOSE injection       . furosemide (LASIX) 20 MG tablet TAKE 2 TABLETS (40 MG) EVERY OTHER DAY ALTERNATING WITH 1 TABLET (20MG ) EVERY OTHER DAY AS INSTRUCTED  135 tablet  0  . HYDROcodone-acetaminophen (NORCO/VICODIN) 5-325 MG per tablet Take 1 tablet by mouth as needed.      . isosorbide mononitrate (IMDUR) 30 MG 24 hr tablet TAKE 1 TABLET EVERY DAY  90 tablet  0  . latanoprost (XALATAN) 0.005 % ophthalmic solution Place 1 drop into the left eye at bedtime.       Marland Kitchen levothyroxine (SYNTHROID, LEVOTHROID) 75 MCG tablet  Take 75 mcg by mouth daily.        . nitroGLYCERIN (NITROSTAT) 0.4 MG SL tablet Place 1 tablet (0.4 mg total) under the tongue every 5 (five) minutes x 3 doses as needed. For chest pain.  25 tablet  5  . potassium chloride (K-DUR) 10 MEQ tablet Take 1 tablet (10 mEq total) by mouth daily.  30 tablet  11  . pramipexole (MIRAPEX) 0.125 MG tablet Take 2 tablets (0.25 mg total) by mouth every evening.  180 tablet  1  . PREMARIN vaginal cream       . temazepam (RESTORIL) 15 MG capsule Take 15 mg by mouth at bedtime.      . vitamin C (ASCORBIC ACID) 500 MG tablet Take 500 mg by mouth daily.      Marland Kitchen warfarin (COUMADIN) 5 MG tablet TAKE AS DIRECTED BY ANTICOAGULATION CLINIC  90 tablet  1   No current facility-administered medications for this visit.    Allergies    Allergen Reactions  . Demerol   . Ebastine     EBS  . Ibuprofen   . Statins     myalgias  . Tetanus Toxoids   . Tetracyclines & Related     Patient Active Problem List   Diagnosis Date Noted  . Encounter for therapeutic drug monitoring 09/16/2013  . Chest pain at rest 03/12/2012  . Cough 09/16/2011  . Fatigue 04/08/2011  . Benign hypertensive heart disease without heart failure 02/11/2011  . Mitral regurgitation 02/11/2011  . Hypothyroidism 02/11/2011  . Hypercholesterolemia 02/11/2011  . Atrial fibrillation 12/03/2010    History  Smoking status  . Never Smoker   Smokeless tobacco  . Never Used    History  Alcohol Use No    Family History  Problem Relation Age of Onset  . Heart attack Neg Hx   . Heart disease Neg Hx   . Stroke Mother   . Stroke Father     Review of Systems: Constitutional: no fever chills diaphoresis or fatigue or change in weight.  Head and neck: no hearing loss, no epistaxis, no photophobia or visual disturbance. Respiratory: No cough, shortness of breath or wheezing. Cardiovascular: No chest pain peripheral edema, palpitations. Gastrointestinal: No abdominal distention, no abdominal pain, no change in bowel habits hematochezia or melena. Genitourinary: No dysuria, no frequency, no urgency, no nocturia. Musculoskeletal:No arthralgias, no back pain, no gait disturbance or myalgias. Neurological: No dizziness, no headaches, no numbness, no seizures, no syncope, no weakness, no tremors. Hematologic: No lymphadenopathy, no easy bruising. Psychiatric: No confusion, no hallucinations, no sleep disturbance.    Physical Exam: Filed Vitals:   10/27/13 1127  BP: 140/77  Pulse: 65   the general appearance reveals a well-developed well-nourished woman in no acute distress.The head and neck exam reveals pupils equal and reactive.  Extraocular movements are full.  There is no scleral icterus.  The mouth and pharynx are normal.  The neck is supple.   The carotids reveal no bruits.  The jugular venous pressure is normal.  The  thyroid is not enlarged.  There is no lymphadenopathy.  The chest is clear to percussion and auscultation.  There are no rales or rhonchi.  Expansion of the chest is symmetrical.  The precordium is quiet.  The first heart sound is normal.  The second heart sound is physiologically split.  There is no murmur gallop rub or click.  There is no abnormal lift or heave.  The abdomen is soft and nontender.  The  bowel sounds are normal.  The liver and spleen are not enlarged.  There are no abdominal masses.  There are no abdominal bruits.  Extremities reveal good pedal pulses.  There is no phlebitis or edema.  There is no cyanosis or clubbing.  Strength is normal and symmetrical in all extremities.  There is no lateralizing weakness.  There are no sensory deficits.  The skin is warm and dry.  There is no rash.     Assessment / Plan: She is to continue same medication.  Current dose of Lasix appears to be appropriate.  When she has increased dyspnea she knows to take an extra Lasix as necessary until the dyspnea returns to baseline.  Her recent chest x-ray showed no evidence of failure and her heart size is normal and she has no peripheral edema. Recheck here in 3 months for followup office visit and EKG. I filled out papers for cardiac clearance for the patient for her retinal surgery if she decides to go ahead with it.  I encouraged her to proceed.

## 2013-10-29 ENCOUNTER — Ambulatory Visit (INDEPENDENT_AMBULATORY_CARE_PROVIDER_SITE_OTHER): Payer: Medicare PPO | Admitting: Podiatrist

## 2013-10-29 ENCOUNTER — Encounter: Payer: Self-pay | Admitting: Podiatrist

## 2013-10-29 ENCOUNTER — Ambulatory Visit (INDEPENDENT_AMBULATORY_CARE_PROVIDER_SITE_OTHER): Payer: Medicare PPO

## 2013-10-29 DIAGNOSIS — L603 Nail dystrophy: Secondary | ICD-10-CM

## 2013-10-29 DIAGNOSIS — M7742 Metatarsalgia, left foot: Secondary | ICD-10-CM

## 2013-10-29 DIAGNOSIS — M79609 Pain in unspecified limb: Secondary | ICD-10-CM

## 2013-10-29 DIAGNOSIS — R52 Pain, unspecified: Secondary | ICD-10-CM

## 2013-10-29 DIAGNOSIS — M779 Enthesopathy, unspecified: Secondary | ICD-10-CM

## 2013-10-29 DIAGNOSIS — M7741 Metatarsalgia, right foot: Secondary | ICD-10-CM

## 2013-10-29 DIAGNOSIS — L608 Other nail disorders: Secondary | ICD-10-CM

## 2013-10-29 DIAGNOSIS — M2040 Other hammer toe(s) (acquired), unspecified foot: Secondary | ICD-10-CM

## 2013-10-29 NOTE — Progress Notes (Signed)
   Subjective:    Patient ID: Kristen Whitehead, female    DOB: April 04, 1923, 80 y.o.   MRN: [id number]  HPI PT STATED BOTH FEET HAVE BURNING SENSATION AND PAINFUL FOR 10 YEARS. THE FEET IS BEEN THE SAME AND FEEL THEY STIFF BUT START WALKING AND GET BETTER. THE SHOES AGGRAVATED MY FEET. TRIED TO WEAR  COMPRESSION SOCKS AND DR. Hester Mates GAVE ME PREMIPEXOLE FOR NERVE PROBLEMS.    Review of Systems  Constitutional: Positive for activity change and fatigue.  Eyes: Positive for visual disturbance.  Gastrointestinal: Positive for abdominal pain, diarrhea and abdominal distention.  Allergic/Immunologic: Positive for food allergies.  Neurological: Positive for tremors and weakness.  All other systems reviewed and are negative.       Objective:   Physical Exam  GENERAL APPEARANCE: Alert, conversant. Appropriately groomed. No acute distress.  VASCULAR: Pedal pulses palpable at 2/4 DP and PT bilateral.  Capillary refill time is immediate to all digits,  Proximal to distal cooling it warm to warm.  Digital hair growth is present bilateral  NEUROLOGIC: sensation is intact epicritically and protectively to 5.07 monofilament at 5/5 sites bilateral.  Light touch is intact bilateral, vibratory sensation intact bilateral, achilles tendon reflex is intact bilateral.  MUSCULOSKELETAL: diffuse discomfort present at the metatarsal heads noted bilateral.  Digits are also uncomfortable 4,5 bilateral from the pressure of the shoes.  She has a decrease in arch height and the majority of her discomfort is due to the shoes being too pointed in the toes and worn in.  Hammertoe deformity present.  DERMATOLOGIC: decreased fat pad is present at the metatarsal heads bilaterally.  Generalized fat pad atrophy consistent with patient's age is present.        Assessment & Plan:  Metatarsalgia, hammertoe, tendonitis  Plan:  Gave suggestions for shoe gear changes and inserts.  I can make a custom insert if she would like to  pursue this option.

## 2013-10-29 NOTE — Patient Instructions (Signed)
I would suggest you go to [employer] (on [address] near [address]) for proper shoe fittings.  You need a square shaped toe for your shoe to keep the added pressure off your small toes.    You need a soft insert to support your arch and forefoot  -  [facility] should carry these as well.   I can also make you a custom orthotic device as well however these are usually more costly than those found at fine shoe stores.

## 2013-11-02 NOTE — Progress Notes (Signed)
Dr Valentina Lucks ordered lateral stabilizer bars to be attached to B/L shoe for stability and recommended the fabrication be from [employer], [address], [place], [place].

## 2013-11-10 ENCOUNTER — Ambulatory Visit (INDEPENDENT_AMBULATORY_CARE_PROVIDER_SITE_OTHER): Payer: Commercial Managed Care - HMO | Admitting: Pharmacist

## 2013-11-10 DIAGNOSIS — Z5181 Encounter for therapeutic drug level monitoring: Secondary | ICD-10-CM

## 2013-11-10 DIAGNOSIS — I4891 Unspecified atrial fibrillation: Secondary | ICD-10-CM

## 2013-11-10 DIAGNOSIS — Z7901 Long term (current) use of anticoagulants: Secondary | ICD-10-CM

## 2013-11-10 LAB — POCT INR: INR: 2.2

## 2013-11-21 ENCOUNTER — Telehealth: Payer: Self-pay | Admitting: Cardiology

## 2013-11-21 NOTE — Telephone Encounter (Signed)
Pt called saying she has had bleeding in her Rt eye. It is not painful and has not affected her vision. She has had recent issue with her OD retina and has seen Dr Zigmund Daniel. I suggested she not take Coumadin today and contact Dr Rodena Piety in am to see what he recomends. She'll contact the RN in the Coumadin clinic after this.  Kerin Ransom PA-C 11/21/2013 4:29 PM

## 2013-11-22 ENCOUNTER — Telehealth: Payer: Self-pay | Admitting: Cardiology

## 2013-11-22 ENCOUNTER — Ambulatory Visit (INDEPENDENT_AMBULATORY_CARE_PROVIDER_SITE_OTHER): Payer: Commercial Managed Care - HMO | Admitting: *Deleted

## 2013-11-22 DIAGNOSIS — Z7901 Long term (current) use of anticoagulants: Secondary | ICD-10-CM | POA: Diagnosis not present

## 2013-11-22 DIAGNOSIS — I4891 Unspecified atrial fibrillation: Secondary | ICD-10-CM | POA: Diagnosis not present

## 2013-11-22 DIAGNOSIS — Z5181 Encounter for therapeutic drug level monitoring: Secondary | ICD-10-CM

## 2013-11-22 LAB — POCT INR: INR: 3.1

## 2013-11-25 ENCOUNTER — Other Ambulatory Visit: Payer: Self-pay

## 2013-11-25 DIAGNOSIS — G609 Hereditary and idiopathic neuropathy, unspecified: Secondary | ICD-10-CM

## 2013-11-25 DIAGNOSIS — G2581 Restless legs syndrome: Secondary | ICD-10-CM

## 2013-11-25 MED ORDER — PRAMIPEXOLE DIHYDROCHLORIDE 0.125 MG PO TABS: 0.2500 mg | ORAL_TABLET | Freq: Every evening | ORAL | Status: DC

## 2013-11-25 NOTE — Telephone Encounter (Signed)
Patient has appt scheduled

## 2013-12-03 ENCOUNTER — Other Ambulatory Visit: Payer: Self-pay | Admitting: Cardiology

## 2013-12-14 ENCOUNTER — Ambulatory Visit (INDEPENDENT_AMBULATORY_CARE_PROVIDER_SITE_OTHER): Payer: Medicare HMO | Admitting: *Deleted

## 2013-12-14 DIAGNOSIS — Z5181 Encounter for therapeutic drug level monitoring: Secondary | ICD-10-CM

## 2013-12-14 DIAGNOSIS — Z7901 Long term (current) use of anticoagulants: Secondary | ICD-10-CM

## 2013-12-14 DIAGNOSIS — I4891 Unspecified atrial fibrillation: Secondary | ICD-10-CM

## 2013-12-14 LAB — POCT INR: INR: 3.1

## 2013-12-27 ENCOUNTER — Ambulatory Visit (INDEPENDENT_AMBULATORY_CARE_PROVIDER_SITE_OTHER): Payer: Commercial Managed Care - HMO | Admitting: Neurology

## 2013-12-27 ENCOUNTER — Encounter: Payer: Self-pay | Admitting: Neurology

## 2013-12-27 VITALS — BP 142/71 | HR 70 | Temp 96.6°F | Ht 59.0 in | Wt 152.0 lb

## 2013-12-27 DIAGNOSIS — R609 Edema, unspecified: Secondary | ICD-10-CM

## 2013-12-27 DIAGNOSIS — G609 Hereditary and idiopathic neuropathy, unspecified: Secondary | ICD-10-CM

## 2013-12-27 DIAGNOSIS — R600 Localized edema: Secondary | ICD-10-CM

## 2013-12-27 DIAGNOSIS — G2581 Restless legs syndrome: Secondary | ICD-10-CM

## 2013-12-27 MED ORDER — PRAMIPEXOLE DIHYDROCHLORIDE 0.125 MG PO TABS: 0.1250 mg | ORAL_TABLET | Freq: Every evening | ORAL | Status: DC

## 2013-12-27 NOTE — Patient Instructions (Signed)
We are going to decrease the pramipexole to 0.125 mg only one pill each night, to see if your swelling improves. You have an appointment with your cardiologist next month. He may do another echocardiogram.

## 2013-12-27 NOTE — Progress Notes (Signed)
Subjective:    Patient ID: Kristen Whitehead is a 80 y.o. female.  HPI    Interim history:   Kristen Whitehead is a very pleasant 80 year old right-handed woman with an underlying medical history of hypertension, hyperlipidemia, atrial fibrillation, celiac disease, scoliosis, reflux disease, who presents for followup consultation of her bilateral foot pain, likely RLS. She is accompanied by her friend, Kristen Whitehead, today. I last saw her on 06/28/2013, at which time I noted on clinical exam that she had mild evidence of neuropathy in the absence of a history of diabetes with normal hemoglobin A1c noted and normal B12 levels. She had EMG and nerve conduction studies that did not show any significant neuropathy. I felt that she had most likely RLS. She had done well with Mirapex. I did not make any changes to her medication regimen and asked her to continue taking Mirapex 0.25 mg each night. She did not have any side effects at the time.  Today, she reports, that she has noted ankle swelling in the last week. She has not felt "so good" recently. She went into cardiac failure last summer, she says, and she feels similar to that. She has an appointment with Dr. Mare Whitehead next month, 02/01/14. Her last echocardiogram was in 1/14, which showed stable findings. She does have some more SOBOE.   I first met her on 02/17/2013, at which time I suggested EMG and nerve conduction testing and a treatment trial with Mirapex because of possible underlying RLS. She has previously seen a podiatrist who had done a nerve biopsy which she reported was normal. She has a history of A. fib. She has a history of congestive heart failure and is followed by cardiology. Echocardiogram in January 2014 showed biatrial enlargement and mild mitral regurgitation and an EF of 47-82% with diastolic dysfunction.  For years she has had burning in her feet and started having a crawling sensation in her distal legs in 2014. She has also had  cramping in her muscles for years and she was taken off of her statin in 2013 and her cramping eventually became better. She has had pins and needles sensation and it helps to walk around. She has worse symptoms at night or by the end of the day. She remembers having "terrible growing pains" as a child and does not have a FHx of RLS as far as she knows. She had nerve biopsies both legs by her podiatrist, which did not show inflammation. While she has a history of chronic back pain she denied any radiating back pain to her legs. In fact she described pins and needle and crawling sensation in the distal lower extremities, radiating upwards. She has been taking OTC Mg supplements, which helped her cramps.  Her EMG and nerve conduction testing from 03/03/2013 showed no evidence of peripheral neuropathy. We called her with her test results. Her foot pain and paresthesias improved.                          Her Past Medical History Is Significant For: Past Medical History  Diagnosis Date  . History of mitral valve prolapse 06/15/2008    a. echo 1/14: mild LVH, EF 60-65%, mild MR, mild to mod BAE, PASP 35  . Chronic atrial fibrillation     managed with rate control and coumadin  . History of congenital mitral regurgitation     mild  . Hypothyroidism   . Hypercholesterolemia   . GERD (gastroesophageal reflux  disease)   . Chest pain     no known ischemic heart disease; negative Myoview July 2013. EF 75% with no ischemia.   . Hypertension   . Bladder prolapse, female, acquired   . Chronic diastolic heart failure   . Chronic anticoagulation   . Heart disease   . Cancer     thyroid    Her Past Surgical History Is Significant For: Past Surgical History  Procedure Laterality Date  . Thyroidectomy    . Cholecystectomy    . Abdominal hysterectomy      Her Family History Is Significant For: Family History  Problem Relation Age of Onset  . Heart attack Neg Hx   . Heart disease Neg Hx   . Stroke  Mother   . Stroke Father     Her Social History Is Significant For: History   Social History  . Marital Status: Widowed    Spouse Name: N/A    Number of Children: 3  . Years of Education: BA   Occupational History  .     Social History Main Topics  . Smoking status: Never Smoker   . Smokeless tobacco: Never Used  . Alcohol Use: No  . Drug Use: No  . Sexual Activity: No   Other Topics Concern  . None   Social History Narrative   Pt lives at home alone.   Caffeine Use: quit 35yr ago    Her Allergies Are:  Allergies  Allergen Reactions  . Demerol   . Ebastine     EBS  . Ibuprofen   . Statins     myalgias  . Tetanus Toxoids   . Tetracyclines & Related   :   Her Current Medications Are:  Outpatient Encounter Prescriptions as of 12/27/2013  Medication Sig  . amLODipine (NORVASC) 2.5 MG tablet Take 1 tablet (2.5 mg total) by mouth daily.  .Marland Kitchenatenolol (TENORMIN) 25 MG tablet THREE TIMES DAILY AS DIRECTED  . augmented betamethasone dipropionate (DIPROLENE-AF) 0.05 % cream Apply 1 application topically daily.  . beta carotene w/minerals (OCUVITE) tablet Take 1 tablet by mouth daily.  . calcium carbonate (OS-CAL - DOSED IN MG OF ELEMENTAL CALCIUM) 1250 MG tablet Take 1 tablet by mouth daily.  . Cholecalciferol (VITAMIN D) 2000 UNITS tablet Take 2,000 Units by mouth daily.   .Marland Kitchenestradiol (ESTRACE VAGINAL) 0.1 MG/GM vaginal cream Place 2 g vaginally 2 (two) times a week.   . fluocinonide cream (LIDEX) 0.05 %   . fluticasone (FLONASE) 50 MCG/ACT nasal spray as needed.   .Marland KitchenFLUZONE HIGH-DOSE injection   . furosemide (LASIX) 20 MG tablet TAKE 2 TABLETS (40 MG) EVERY OTHER DAY ALTERNATING WITH 1 TABLET (20MG) EVERY OTHER DAY AS INSTRUCTED  . HYDROcodone-acetaminophen (NORCO/VICODIN) 5-325 MG per tablet Take 1 tablet by mouth as needed.  . isosorbide mononitrate (IMDUR) 30 MG 24 hr tablet TAKE 1 TABLET EVERY DAY  . latanoprost (XALATAN) 0.005 % ophthalmic solution Place 1 drop  into the left eye at bedtime.   .Marland Kitchenlevothyroxine (SYNTHROID, LEVOTHROID) 75 MCG tablet Take 75 mcg by mouth daily.    . nitroGLYCERIN (NITROSTAT) 0.4 MG SL tablet Place 1 tablet (0.4 mg total) under the tongue every 5 (five) minutes x 3 doses as needed. For chest pain.  . potassium chloride (K-DUR) 10 MEQ tablet Take 1 tablet (10 mEq total) by mouth daily.  . pramipexole (MIRAPEX) 0.125 MG tablet Take 2 tablets (0.25 mg total) by mouth every evening.  .Marland KitchenPREMARIN vaginal cream   .  temazepam (RESTORIL) 15 MG capsule Take 15 mg by mouth at bedtime.  . vitamin C (ASCORBIC ACID) 500 MG tablet Take 500 mg by mouth daily.  Marland Kitchen warfarin (COUMADIN) 5 MG tablet TAKE AS DIRECTED BY ANTICOAGULATION CLINIC  :  Review of Systems:  Out of a complete 14 point review of systems, all are reviewed and negative with the exception of these symptoms as listed below:  Review of Systems  Constitutional: Positive for activity change, fatigue and unexpected weight change.  HENT: Positive for facial swelling and tinnitus.   Eyes: Negative.   Respiratory: Positive for shortness of breath.   Cardiovascular: Positive for leg swelling.  Gastrointestinal: Negative.   Endocrine: Negative.   Genitourinary: Positive for enuresis.  Musculoskeletal: Positive for myalgias.  Skin: Negative.   Allergic/Immunologic: Negative.   Neurological: Negative.   Hematological: Negative.   Psychiatric/Behavioral: Positive for sleep disturbance (restless leg, insomnia).    Objective:  Neurologic Exam  Physical Exam Physical Examination:   Filed Vitals:   12/27/13 1159  BP: 142/71  Pulse: 70  Temp: 96.6 F (35.9 C)    General Examination: The patient is a very pleasant 80 y.o. female in no acute distress. She appears well-developed and well-nourished and well groomed.   HEENT: Normocephalic, atraumatic, pupils are equal, round and reactive to light and accommodation. Extraocular tracking is good without limitation to gaze  excursion or nystagmus noted. Normal smooth pursuit is noted. Her funduscopy was normal. She is s/p cataract repairs. Hearing is grossly intact. Face is symmetric with normal facial animation and normal facial sensation. Speech is clear with no dysarthria noted. There is no hypophonia. There is no lip, neck/head, jaw or voice tremor. Neck is supple with full range of passive and active motion. There are no carotid bruits on auscultation. Oropharynx exam reveals: mild mouth dryness, adequate dental hygiene and no airway crowding. Tongue protrudes centrally and palate elevates symmetrically.    Chest: Clear to auscultation without wheezing, rhonchi or crackles noted.  Heart: S1+S2+0, regular and normal without murmurs, rubs or gallops noted.   Abdomen: Soft, non-tender and non-distended with normal bowel sounds appreciated on auscultation.  Extremities: There is 1+ pitting edema in the distal lower extremities bilaterally. Pedal pulses are intact.  Skin: Warm and dry without trophic changes noted. There are no varicose veins.  Musculoskeletal: exam reveals no obvious joint deformities, tenderness or joint swelling or erythema.   Neurologically:  Mental status: The patient is awake, alert and oriented in all 4 spheres. Her memory, attention, language and knowledge are appropriate. There is no aphasia, agnosia, apraxia or anomia. Speech is clear with normal prosody and enunciation. Thought process is linear. Mood is congruent and affect is normal.  Cranial nerves are as described above under HEENT exam. In addition, shoulder shrug is normal with equal shoulder height noted. Motor exam: Normal bulk, strength and tone is noted. There is no drift, tremor or rebound. Romberg is negative. Reflexes are 1+ in the UEs and trace in the knees and absent in both ankles. Toes are downgoing bilaterally. Fine motor skills are intact with normal finger taps, normal hand movements, normal rapid alternating patting,  normal foot taps and normal foot agility.  Cerebellar testing shows no dysmetria or intention tremor on finger to nose testing. There is no truncal or gait ataxia.  Sensory exam is intact to light touch, pinprick, vibration, temperature sense in the upper extremities, but mildly decreased to all modalities in the distal LEs.  Gait, station and balance:  she stands with no significant difficulty. No veering to one side is noted. No leaning to one side is noted. Posture is age-appropriate and stance is narrow based. No problems turning are noted. Her R foot tends to point outward some.   Assessment and Plan:   In summary, Kristen Whitehead is a very pleasant 80 year old female with a history of leg and foot pain. Her physical exam is consistent with mild neuropathy in the absence of a Hx of DM and with normal B12. Her neuropathy seems stable and I think she has a combination of mild PN and RLS. Her EMG/NCV from last year did not show any evidence of significant neuropathy. She has done fairly well with Mirapex, but has had LE swelling, which seems stable from last time. She has had some recent increase in SOB and has an appointment with Dr. Mare Whitehead in June. She denies any side effects and feels improved as far as her foot pain and her paresthesias are concerned, and reports, that foot swelling in her feet in the past has always been heart related. She is on pramipexole 0.25 mg each night and tolerates this well, but I would like to see if her swelling improves with lowering the dose to 0.125 mg each night. She was in agreement. If she has flareup of her restless leg symptoms we may decide to increase the Mirapex back up to 0.25 mg nightly but I would really like to wait until after her cardiology appointment next month.

## 2013-12-31 ENCOUNTER — Ambulatory Visit (INDEPENDENT_AMBULATORY_CARE_PROVIDER_SITE_OTHER): Payer: Commercial Managed Care - HMO | Admitting: Pharmacist

## 2013-12-31 DIAGNOSIS — Z7901 Long term (current) use of anticoagulants: Secondary | ICD-10-CM | POA: Diagnosis not present

## 2013-12-31 DIAGNOSIS — Z5181 Encounter for therapeutic drug level monitoring: Secondary | ICD-10-CM | POA: Diagnosis not present

## 2013-12-31 DIAGNOSIS — I4891 Unspecified atrial fibrillation: Secondary | ICD-10-CM

## 2013-12-31 LAB — POCT INR: INR: 1.5

## 2014-01-05 ENCOUNTER — Other Ambulatory Visit: Payer: Self-pay | Admitting: Cardiology

## 2014-01-13 ENCOUNTER — Ambulatory Visit (INDEPENDENT_AMBULATORY_CARE_PROVIDER_SITE_OTHER): Payer: Commercial Managed Care - HMO

## 2014-01-13 DIAGNOSIS — Z7901 Long term (current) use of anticoagulants: Secondary | ICD-10-CM

## 2014-01-13 DIAGNOSIS — I4891 Unspecified atrial fibrillation: Secondary | ICD-10-CM

## 2014-01-13 DIAGNOSIS — Z5181 Encounter for therapeutic drug level monitoring: Secondary | ICD-10-CM

## 2014-01-13 LAB — POCT INR: INR: 1.7

## 2014-01-18 ENCOUNTER — Telehealth: Payer: Self-pay | Admitting: Cardiology

## 2014-01-18 NOTE — Telephone Encounter (Signed)
Follow up ° ° ° ° °Returning Kristen Whitehead's call °

## 2014-01-18 NOTE — Telephone Encounter (Signed)
New message     Pt says her feet are swollen.  She slept with them elevated last night but this am they were still swollen.  Please advise

## 2014-01-18 NOTE — Telephone Encounter (Signed)
Spoke with patient and she has only been taking her Lasix 20 mg daily instead of 20 mg alternating with 40 mg. She has had increased swelling and shortness of breath. Advised to get back on her 20 mg alternating with 40 mg and call Friday if no better. Discussed with  [name] and he agreed with advise given

## 2014-01-18 NOTE — Telephone Encounter (Signed)
Left message to call back  

## 2014-01-24 ENCOUNTER — Telehealth: Payer: Self-pay | Admitting: Cardiology

## 2014-01-24 DIAGNOSIS — Z79899 Other long term (current) drug therapy: Secondary | ICD-10-CM

## 2014-01-24 NOTE — Telephone Encounter (Signed)
Patient saw PCP last week and was instructed to call  [name]. Follow up with PCP Thursday and  [name] on 6/16.  [name] reviewed medications and will have patient increase Lasix to 40 mg daily and K+ to 2 tablets daily until ov next week. Advised patient, verbalized understanding. Will get bmet at office visit

## 2014-01-24 NOTE — Telephone Encounter (Signed)
New Message  Pt called states that she is having problems with swelling in her feet, legs and abdomen.  Pt states that her left knee has alot of pain. Pt also states that she went to see Dr. Jacelyn Grip at [facility] and he has advised her to follow up with [name].. Requests a call back from the nurse. Please assist

## 2014-01-27 ENCOUNTER — Ambulatory Visit (INDEPENDENT_AMBULATORY_CARE_PROVIDER_SITE_OTHER): Payer: Commercial Managed Care - HMO

## 2014-01-27 DIAGNOSIS — Z7901 Long term (current) use of anticoagulants: Secondary | ICD-10-CM

## 2014-01-27 DIAGNOSIS — Z5181 Encounter for therapeutic drug level monitoring: Secondary | ICD-10-CM

## 2014-01-27 DIAGNOSIS — I4891 Unspecified atrial fibrillation: Secondary | ICD-10-CM

## 2014-01-27 LAB — POCT INR: INR: 2.4

## 2014-01-31 ENCOUNTER — Ambulatory Visit: Payer: Medicare HMO | Admitting: Cardiology

## 2014-02-01 ENCOUNTER — Other Ambulatory Visit: Payer: Commercial Managed Care - HMO

## 2014-02-01 ENCOUNTER — Ambulatory Visit (INDEPENDENT_AMBULATORY_CARE_PROVIDER_SITE_OTHER): Payer: Commercial Managed Care - HMO | Admitting: Cardiology

## 2014-02-01 ENCOUNTER — Encounter: Payer: Self-pay | Admitting: Cardiology

## 2014-02-01 VITALS — BP 136/68 | HR 75 | Ht 59.0 in | Wt 151.0 lb

## 2014-02-01 DIAGNOSIS — I340 Nonrheumatic mitral (valve) insufficiency: Secondary | ICD-10-CM

## 2014-02-01 DIAGNOSIS — I5033 Acute on chronic diastolic (congestive) heart failure: Secondary | ICD-10-CM

## 2014-02-01 DIAGNOSIS — R0600 Dyspnea, unspecified: Secondary | ICD-10-CM

## 2014-02-01 DIAGNOSIS — I5032 Chronic diastolic (congestive) heart failure: Secondary | ICD-10-CM

## 2014-02-01 DIAGNOSIS — R0609 Other forms of dyspnea: Secondary | ICD-10-CM

## 2014-02-01 DIAGNOSIS — R0989 Other specified symptoms and signs involving the circulatory and respiratory systems: Secondary | ICD-10-CM

## 2014-02-01 DIAGNOSIS — I059 Rheumatic mitral valve disease, unspecified: Secondary | ICD-10-CM

## 2014-02-01 DIAGNOSIS — R059 Cough, unspecified: Secondary | ICD-10-CM

## 2014-02-01 DIAGNOSIS — I119 Hypertensive heart disease without heart failure: Secondary | ICD-10-CM

## 2014-02-01 DIAGNOSIS — R05 Cough: Secondary | ICD-10-CM

## 2014-02-01 DIAGNOSIS — I4891 Unspecified atrial fibrillation: Secondary | ICD-10-CM

## 2014-02-01 HISTORY — DX: Acute on chronic diastolic (congestive) heart failure: I50.33

## 2014-02-01 NOTE — Assessment & Plan Note (Addendum)
The patient has a past history high blood pressure she remains on low-dose amlodipine and is presently taking a total of 40 mg of Lasix a day.  Her blood pressure today is satisfactory.  She has not been having any dizzy spells or syncope.

## 2014-02-01 NOTE — Patient Instructions (Signed)
Your physician recommends that you continue on your current medications as directed. Please refer to the Current Medication list given to you today.   A chest x-ray takes a picture of the organs and structures inside the chest, including the heart, lungs, and blood vessels. This test can show several things, including, whether the heart is enlarges; whether fluid is building up in the lungs; and whether pacemaker / defibrillator leads are still in place. Go to [facility]  Your physician has requested that you have an echocardiogram.  Echocardiography is a painless test that uses sound waves to create images of your heart. It provides your doctor with information about the size and shape of your heart and how well your heart's chambers and valves are working. This procedure takes approximately one hour. There are no restrictions for this procedure.  Your physician recommends that you keep your scheduled  follow-up appointment on 8/17 at 11:00 am.  Heart Failure Heart failure is a condition in which the heart has trouble pumping blood. This means your heart does not pump blood efficiently for your body to work well. In some cases of heart failure, fluid may back up into your lungs or you may have swelling (edema) in your lower legs. Heart failure is usually a long-term (chronic) condition. It is important for you to take good care of yourself and follow your caregiver's treatment plan. CAUSES  Some health conditions can cause heart failure. Those health conditions include:  High blood pressure (hypertension) causes the heart muscle to work harder than normal. When pressure in the blood vessels is high, the heart needs to pump (contract) with more force in order to circulate blood throughout the body. High blood pressure eventually causes the heart to become stiff and weak.  Coronary artery disease (CAD) is the buildup of cholesterol and fat (plaque) in the arteries of the heart. The  blockage in the arteries deprives the heart muscle of oxygen and blood. This can cause chest pain and may lead to a heart attack. High blood pressure can also contribute to CAD.  Heart attack (myocardial infarction) occurs when 1 or more arteries in the heart become blocked. The loss of oxygen damages the muscle tissue of the heart. When this happens, part of the heart muscle dies. The injured tissue does not contract as well and weakens the heart's ability to pump blood.  Abnormal heart valves can cause heart failure when the heart valves do not open and close properly. This makes the heart muscle pump harder to keep the blood flowing.  Heart muscle disease (cardiomyopathy or myocarditis) is damage to the heart muscle from a variety of causes. These can include drug or alcohol abuse, infections, or unknown reasons. These can increase the risk of heart failure.  Lung disease makes the heart work harder because the lungs do not work properly. This can cause a strain on the heart, leading it to fail.  Diabetes increases the risk of heart failure. High blood sugar contributes to high fat (lipid) levels in the blood. Diabetes can also cause slow damage to tiny blood vessels that carry important nutrients to the heart muscle. When the heart does not get enough oxygen and food, it can cause the heart to become weak and stiff. This leads to a heart that does not contract efficiently.  Other conditions can contribute to heart failure. These include abnormal heart rhythms, thyroid problems, and low blood counts (anemia). Certain unhealthy behaviors can increase the risk of  heart failure. Those unhealthy behaviors include:  Being overweight.  Smoking or chewing tobacco.  Eating foods high in fat and cholesterol.  Abusing illicit drugs or alcohol.  Lacking physical activity. SYMPTOMS  Heart failure symptoms may vary and can be hard to detect. Symptoms may include:  Shortness of breath with activity,  such as climbing stairs.  Persistent cough.  Swelling of the feet, ankles, legs, or abdomen.  Unexplained weight gain.  Difficulty breathing when lying flat (orthopnea).  Waking from sleep because of the need to sit up and get more air.  Rapid heartbeat.  Fatigue and loss of energy.  Feeling lightheaded, dizzy, or close to fainting.  Loss of appetite.  Nausea.  Increased urination during the night (nocturia). DIAGNOSIS  A diagnosis of heart failure is based on your history, symptoms, physical examination, and diagnostic tests. Diagnostic tests for heart failure may include:  Echocardiography.  Electrocardiography.  Chest X-ray.  Blood tests.  Exercise stress test.  Cardiac angiography.  Radionuclide scans. TREATMENT  Treatment is aimed at managing the symptoms of heart failure. Medicines, behavioral changes, or surgical intervention may be necessary to treat heart failure.  Medicines to help treat heart failure may include:  Angiotensin-converting enzyme (ACE) inhibitors. This type of medicine blocks the effects of a blood protein called angiotensin-converting enzyme. ACE inhibitors relax (dilate) the blood vessels and help lower blood pressure.  Angiotensin receptor blockers. This type of medicine blocks the actions of a blood protein called angiotensin. Angiotensin receptor blockers dilate the blood vessels and help lower blood pressure.  Water pills (diuretics). Diuretics cause the kidneys to remove salt and water from the blood. The extra fluid is removed through urination. This loss of extra fluid lowers the volume of blood the heart pumps.  Beta blockers. These prevent the heart from beating too fast and improve heart muscle strength.  Digitalis. This increases the force of the heartbeat.  Healthy behavior changes include:  Obtaining and maintaining a healthy weight.  Stopping smoking or chewing tobacco.  Eating heart healthy foods.  Limiting or  avoiding alcohol.  Stopping illicit drug use.  Physical activity as directed by your caregiver.  Surgical treatment for heart failure may include:  A procedure to open blocked arteries, repair damaged heart valves, or remove damaged heart muscle tissue.  A pacemaker to improve heart muscle function and control certain abnormal heart rhythms.  An internal cardioverter defibrillator to treat certain serious abnormal heart rhythms.  A left ventricular assist device to assist the pumping ability of the heart. HOME CARE INSTRUCTIONS   Take your medicine as directed by your caregiver. Medicines are important in reducing the workload of your heart, slowing the progression of heart failure, and improving your symptoms.  Do not stop taking your medicine unless directed by your caregiver.  Do not skip any dose of medicine.  Refill your prescriptions before you run out of medicine. Your medicines are needed every day.  Take over-the-counter medicine only as directed by your caregiver or pharmacist.  Engage in moderate physical activity if directed by your caregiver. Moderate physical activity can benefit some people. The elderly and people with severe heart failure should consult with a caregiver for physical activity recommendations.  Eat heart healthy foods. Food choices should be free of trans fat and low in saturated fat, cholesterol, and salt (sodium). Healthy choices include fresh or frozen fruits and vegetables, fish, lean meats, legumes, fat-free or low-fat dairy products, and whole grain or high fiber foods. Talk to a  dietitian to learn more about heart healthy foods.  Limit sodium if directed by your caregiver. Sodium restriction may reduce symptoms of heart failure in some people. Talk to a dietitian to learn more about heart healthy seasonings.  Use healthy cooking methods. Healthy cooking methods include roasting, grilling, broiling, baking, poaching, steaming, or stir-frying. Talk  to a dietitian to learn more about healthy cooking methods.  Limit fluids if directed by your caregiver. Fluid restriction may reduce symptoms of heart failure in some people.  Weigh yourself every day. Daily weights are important in the early recognition of excess fluid. You should weigh yourself every morning after you urinate and before you eat breakfast. Wear the same amount of clothing each time you weigh yourself. Record your daily weight. Provide your caregiver with your weight record.  Monitor and record your blood pressure if directed by your caregiver.  Check your pulse if directed by your caregiver.  Lose weight if directed by your caregiver. Weight loss may reduce symptoms of heart failure in some people.  Stop smoking or chewing tobacco. Nicotine makes your heart work harder by causing your blood vessels to constrict. Do not use nicotine gum or patches before talking to your caregiver.  Schedule and attend follow-up visits as directed by your caregiver. It is important to keep all your appointments.  Limit alcohol intake to no more than 1 drink per day for nonpregnant women and 2 drinks per day for men. Drinking more than that is harmful to your heart. Tell your caregiver if you drink alcohol several times a week. Talk with your caregiver about whether alcohol is safe for you. If your heart has already been damaged by alcohol or you have severe heart failure, drinking alcohol should be stopped completely.  Stop illicit drug use.  Stay up-to-date with immunizations. It is especially important to prevent respiratory infections through current pneumococcal and influenza immunizations.  Manage other health conditions such as hypertension, diabetes, thyroid disease, or abnormal heart rhythms as directed by your caregiver.  Learn to manage stress.  Plan rest periods when fatigued.  Learn strategies to manage high temperatures. If the weather is extremely hot:  Avoid vigorous  physical activity.  Use air conditioning or fans or seek a cooler location.  Avoid caffeine and alcohol.  Wear loose-fitting, lightweight, and light-colored clothing.  Learn strategies to manage cold temperatures. If the weather is extremely cold:  Avoid vigorous physical activity.  Layer clothes.  Wear mittens or gloves, a hat, and a scarf when going outside.  Avoid alcohol.  Obtain ongoing education and support as needed.  Participate or seek rehabilitation as needed to maintain or improve independence and quality of life. SEEK MEDICAL CARE IF:   Your weight increases by 03 lb/1.4 kg in 1 day or 05 lb/2.3 kg in a week.  You have increasing shortness of breath that is unusual for you.  You are unable to participate in your usual physical activities.  You tire easily.  You cough more than normal, especially with physical activity.  You have any or more swelling in areas such as your hands, feet, ankles, or abdomen.  You are unable to sleep because it is hard to breathe.  You feel like your heart is beating fast (palpitations).  You become dizzy or lightheaded upon standing up. SEEK IMMEDIATE MEDICAL CARE IF:   You have difficulty breathing.  There is a change in mental status such as decreased alertness or difficulty with concentration.  You have a pain  or discomfort in your chest.  You have an episode of fainting (syncope). MAKE SURE YOU:   Understand these instructions.  Will watch your condition.  Will get help right away if you are not doing well or get worse. Document Released: 08/05/2005 Document Revised: 11/30/2012 Document Reviewed: 08/27/2012 [facility] Patient Information 2014 [place], [place].

## 2014-02-01 NOTE — Assessment & Plan Note (Signed)
She has had a nonproductive cough.  I suspect that this is from her diastolic heart failure.  We will update her chest x-ray and her echocardiogram

## 2014-02-01 NOTE — Progress Notes (Signed)
Kristen Whitehead Date of Birth:  June 07, 1923 [facility] [address] [place], [place]  [address] [phone]        Fax   [phone]   History of Present Illness: This pleasant 80 year old woman is seen for a scheduled followup office visit. She has a history of established atrial fibrillation. She does not have any history of ischemic heart disease. She had a normal Myoview stress test in January 2011. Kristen Whitehead saw her as a work in on 03/12/12 and she had another Myoview on 03/18/12 which showed no evidence of ischemia and her ejection fraction was 75%. In January 2014 she was seen by Kristen Whitehead. She subsequently had an update of her echocardiogram on 09/17/12 which showed an ejection fraction of 60-65% and biatrial enlargement. There was mild mitral regurgitation.  The patient has a history of compensated diastolic congestive heart failure. Dr. Hulan Whitehead did a B. natruretic peptide on 01/27/13 which was elevated at 443 the patient had electrolytes drawn on 03/04/13 showing BUN of 8 and creatinine 0.61  . The patient had a chest x-ray on 03/21/13 which showed normal heart size and clear lungs.  Recently the patient has had increasing problems with weight gain edema and exertional dyspnea she saw Dr. Yaakov Whitehead who will be her dose of diuretic with improvement.  Current Outpatient Prescriptions  Medication Sig Dispense Refill  . amLODipine (NORVASC) 2.5 MG tablet Take 1 tablet (2.5 mg total) by mouth daily.  90 tablet  3  . atenolol (TENORMIN) 25 MG tablet THREE TIMES DAILY AS DIRECTED      . augmented betamethasone dipropionate (DIPROLENE-AF) 0.05 % cream Apply 1 application topically daily.      . beta carotene w/minerals (OCUVITE) tablet Take 1 tablet by mouth daily.      . calcium carbonate (OS-CAL - DOSED IN MG OF ELEMENTAL CALCIUM) 1250 MG tablet Take 1 tablet by mouth daily.      . Cholecalciferol (VITAMIN D) 2000 UNITS tablet Take 2,000 Units by mouth daily.        Marland Kitchen estradiol (ESTRACE VAGINAL) 0.1 MG/GM vaginal cream Place 2 g vaginally 2 (two) times a week.       . fluocinonide cream (LIDEX) 0.05 %       . fluticasone (FLONASE) 50 MCG/ACT nasal spray as needed.       Marland Kitchen FLUZONE HIGH-DOSE injection       . furosemide (LASIX) 20 MG tablet TAKE 2 TABLETS (40 MG) EVERY DAILY      . HYDROcodone-acetaminophen (NORCO/VICODIN) 5-325 MG per tablet Take 1 tablet by mouth as needed.      . isosorbide mononitrate (IMDUR) 30 MG 24 hr tablet TAKE 1 TABLET EVERY DAY  90 tablet  0  . latanoprost (XALATAN) 0.005 % ophthalmic solution Place 1 drop into the left eye at bedtime.       Marland Kitchen levothyroxine (SYNTHROID, LEVOTHROID) 75 MCG tablet Take 75 mcg by mouth daily.        Marland Kitchen NITROSTAT 0.4 MG SL tablet DISSOLVE 1 TABLET UNDER THE TONGUE EVERY 5 MINUTES FOR 3 DOSES AS NEEDED  FOR  CHEST  PAIN  25 tablet  5  . potassium chloride (K-DUR) 10 MEQ tablet Take 10 mEq by mouth 2 (two) times daily.      . pramipexole (MIRAPEX) 0.125 MG tablet Take 1 tablet (0.125 mg total) by mouth every evening.  90 tablet  3  . PREMARIN vaginal cream       .  temazepam (RESTORIL) 15 MG capsule Take 15 mg by mouth at bedtime.      . vitamin C (ASCORBIC ACID) 500 MG tablet Take 500 mg by mouth daily.      Marland Kitchen warfarin (COUMADIN) 5 MG tablet TAKE AS DIRECTED BY ANTICOAGULATION CLINIC  90 tablet  1   No current facility-administered medications for this visit.    Allergies  Allergen Reactions  . Demerol   . Ebastine     EBS  . Ibuprofen   . Statins     myalgias  . Tetanus Toxoids   . Tetracyclines & Related     Patient Active Problem List   Diagnosis Date Noted  . Chronic diastolic heart failure 27/02/8674  . Dyspnea 02/01/2014  . Encounter for therapeutic drug monitoring 09/16/2013  . Chest pain at rest 03/12/2012  . Cough 09/16/2011  . Fatigue 04/08/2011  . Benign hypertensive heart disease without heart failure 02/11/2011  . Mitral regurgitation 02/11/2011  . Hypothyroidism  02/11/2011  . Hypercholesterolemia 02/11/2011  . Atrial fibrillation 12/03/2010    History  Smoking status  . Never Smoker   Smokeless tobacco  . Never Used    History  Alcohol Use No    Family History  Problem Relation Age of Onset  . Heart attack Neg Hx   . Heart disease Neg Hx   . Stroke Mother   . Stroke Father     Review of Systems: Constitutional: no fever chills diaphoresis or fatigue or change in weight.  Head and neck: no hearing loss, no epistaxis, no photophobia or visual disturbance. Respiratory: No cough, shortness of breath or wheezing. Cardiovascular: No chest pain peripheral edema, palpitations. Gastrointestinal: No abdominal distention, no abdominal pain, no change in bowel habits hematochezia or melena. Genitourinary: No dysuria, no frequency, no urgency, no nocturia. Musculoskeletal:No arthralgias, no back pain, no gait disturbance or myalgias. Neurological: No dizziness, no headaches, no numbness, no seizures, no syncope, no weakness, no tremors. Hematologic: No lymphadenopathy, no easy bruising. Psychiatric: No confusion, no hallucinations, no sleep disturbance.    Physical Exam: Filed Vitals:   02/01/14 1517  BP: 136/68  Pulse: 75   the general appearance reveals an alert elderly woman in no distress.The head and neck exam reveals pupils equal and reactive.  Extraocular movements are full.  There is no scleral icterus.  The mouth and pharynx are normal.  The neck is supple.  The carotids reveal no bruits.  The jugular venous pressure is normal.  The  thyroid is not enlarged.  There is no lymphadenopathy.  The chest is clear to percussion and auscultation.  There are no rales or rhonchi.  Expansion of the chest is symmetrical.  The precordium is quiet.  The pulse is irregularly irregular The first heart sound is normal.  The second heart sound is physiologically split.  There is a soft apical holosystolic murmur. There is no abnormal lift or heave.  The  abdomen is soft and nontender.  The bowel sounds are normal.  The liver and spleen are not enlarged.  There are no abdominal masses.  There are no abdominal bruits.  Extremities reveal good pedal pulses.  There is no phlebitis.  There is mild pretibial and pedal edema.  She is wearing support hose.  There is no cyanosis or clubbing.  Strength is normal and symmetrical in all extremities.  There is no lateralizing weakness.  There are no sensory deficits.  The skin is warm and dry.  There is no rash.  EKG today shows atrial fibrillation with a controlled ventricular response of 75 per minute.  No ischemic changes.   Assessment / Plan: 1. permanent atrial fibrillation 2. chronic diastolic heart failure 3. mitral regurgitation 4. hypertensive heart disease . Plan: Update her chest x-ray and her 2-D echo.  Continue current dose of Lasix 40 mg a day.  However I told her that if she starts to notice her weight go up quickly are that her edema is worsening she may increase her furosemide from 40 mg up to 60 mg each day.

## 2014-02-01 NOTE — Assessment & Plan Note (Signed)
The patient's weight today is up 1 pound since we saw her 3 months ago.  She reports that her weight was up higher than that prior to going on the stronger dose of furosemide 40 mg daily.  She is still having some daily peripheral edema which goes down at night when she sleeps with her legs elevated.  She is still having some exertional dyspnea.

## 2014-02-01 NOTE — Assessment & Plan Note (Signed)
The patient is in permanent atrial fibrillation on Coumadin.  She has not been having any TIA or stroke symptoms

## 2014-02-11 ENCOUNTER — Telehealth: Payer: Self-pay | Admitting: Cardiology

## 2014-02-11 ENCOUNTER — Ambulatory Visit (INDEPENDENT_AMBULATORY_CARE_PROVIDER_SITE_OTHER): Payer: Commercial Managed Care - HMO

## 2014-02-11 DIAGNOSIS — Z5181 Encounter for therapeutic drug level monitoring: Secondary | ICD-10-CM

## 2014-02-11 DIAGNOSIS — I4891 Unspecified atrial fibrillation: Secondary | ICD-10-CM

## 2014-02-11 DIAGNOSIS — Z7901 Long term (current) use of anticoagulants: Secondary | ICD-10-CM

## 2014-02-11 LAB — POCT INR: INR: 3.7

## 2014-02-11 NOTE — Telephone Encounter (Signed)
Pt reports increased bruising over past few days.  Reports she has been in CHF and has not had much of an appetite and has not been eating her green leafy vegetables as she normally would.  Advised pt not to decrease her Coumadin dosage without having INR checked.  Made appt to check INR today at 1:15pm/ewj

## 2014-02-11 NOTE — Telephone Encounter (Signed)
New problem    Pt is bruising really bad and thinks she is taking to much blood thinner. Pt thinks she might need to come in and have her INR checked. Please call pt.

## 2014-02-15 ENCOUNTER — Telehealth: Payer: Self-pay | Admitting: Cardiology

## 2014-02-15 NOTE — Telephone Encounter (Signed)
Advised patient order in computer system and she just goes and gives her name and [facility] can access order. Verbalized understanding and will be going soon

## 2014-02-15 NOTE — Telephone Encounter (Signed)
New Message   Pt called states that she was advised per a nurse to have a chest xray and that an appt was not needed. Pt states that she wasn't given any forms or information. Pt requests a call back to clarify how to move forward.

## 2014-02-17 ENCOUNTER — Ambulatory Visit
Admission: RE | Admit: 2014-02-17 | Discharge: 2014-02-17 | Disposition: A | Discharge status: Home / Self Care | Payer: Commercial Managed Care - HMO | Source: Ambulatory Visit | Attending: Cardiology | Admitting: Cardiology

## 2014-02-17 DIAGNOSIS — R0600 Dyspnea, unspecified: Secondary | ICD-10-CM

## 2014-02-17 DIAGNOSIS — I4891 Unspecified atrial fibrillation: Secondary | ICD-10-CM

## 2014-02-17 DIAGNOSIS — I5032 Chronic diastolic (congestive) heart failure: Secondary | ICD-10-CM

## 2014-02-17 DIAGNOSIS — I340 Nonrheumatic mitral (valve) insufficiency: Secondary | ICD-10-CM

## 2014-02-23 ENCOUNTER — Other Ambulatory Visit: Payer: Self-pay | Admitting: Cardiology

## 2014-02-25 ENCOUNTER — Ambulatory Visit (INDEPENDENT_AMBULATORY_CARE_PROVIDER_SITE_OTHER): Payer: Commercial Managed Care - HMO | Admitting: Pharmacist

## 2014-02-25 DIAGNOSIS — Z7901 Long term (current) use of anticoagulants: Secondary | ICD-10-CM

## 2014-02-25 DIAGNOSIS — I4891 Unspecified atrial fibrillation: Secondary | ICD-10-CM

## 2014-02-25 DIAGNOSIS — Z5181 Encounter for therapeutic drug level monitoring: Secondary | ICD-10-CM

## 2014-02-25 LAB — POCT INR: INR: 2.6

## 2014-03-01 ENCOUNTER — Ambulatory Visit (HOSPITAL_COMMUNITY): Payer: Medicare HMO | Attending: Cardiology | Admitting: Radiology

## 2014-03-01 DIAGNOSIS — I509 Heart failure, unspecified: Secondary | ICD-10-CM | POA: Insufficient documentation

## 2014-03-01 DIAGNOSIS — I059 Rheumatic mitral valve disease, unspecified: Secondary | ICD-10-CM | POA: Insufficient documentation

## 2014-03-01 DIAGNOSIS — R0600 Dyspnea, unspecified: Secondary | ICD-10-CM

## 2014-03-01 DIAGNOSIS — I340 Nonrheumatic mitral (valve) insufficiency: Secondary | ICD-10-CM

## 2014-03-01 DIAGNOSIS — I4891 Unspecified atrial fibrillation: Secondary | ICD-10-CM | POA: Insufficient documentation

## 2014-03-01 DIAGNOSIS — I379 Nonrheumatic pulmonary valve disorder, unspecified: Secondary | ICD-10-CM | POA: Insufficient documentation

## 2014-03-01 DIAGNOSIS — I5032 Chronic diastolic (congestive) heart failure: Secondary | ICD-10-CM

## 2014-03-01 DIAGNOSIS — I079 Rheumatic tricuspid valve disease, unspecified: Secondary | ICD-10-CM | POA: Insufficient documentation

## 2014-03-01 DIAGNOSIS — I359 Nonrheumatic aortic valve disorder, unspecified: Secondary | ICD-10-CM | POA: Insufficient documentation

## 2014-03-01 DIAGNOSIS — R0602 Shortness of breath: Secondary | ICD-10-CM | POA: Insufficient documentation

## 2014-03-01 NOTE — Progress Notes (Signed)
Echocardiogram performed.  

## 2014-03-18 ENCOUNTER — Ambulatory Visit (INDEPENDENT_AMBULATORY_CARE_PROVIDER_SITE_OTHER): Payer: Commercial Managed Care - HMO | Admitting: Pharmacist

## 2014-03-18 DIAGNOSIS — Z7901 Long term (current) use of anticoagulants: Secondary | ICD-10-CM | POA: Diagnosis not present

## 2014-03-18 DIAGNOSIS — Z5181 Encounter for therapeutic drug level monitoring: Secondary | ICD-10-CM | POA: Diagnosis not present

## 2014-03-18 DIAGNOSIS — I4891 Unspecified atrial fibrillation: Secondary | ICD-10-CM

## 2014-03-18 LAB — POCT INR: INR: 3.5

## 2014-03-25 ENCOUNTER — Ambulatory Visit (INDEPENDENT_AMBULATORY_CARE_PROVIDER_SITE_OTHER): Payer: Commercial Managed Care - HMO | Admitting: Podiatrist

## 2014-03-25 ENCOUNTER — Encounter: Payer: Self-pay | Admitting: Podiatrist

## 2014-03-25 VITALS — BP 121/73 | HR 65 | Resp 16 | Ht 60.0 in | Wt 145.0 lb

## 2014-03-25 DIAGNOSIS — B351 Tinea unguium: Secondary | ICD-10-CM

## 2014-03-25 DIAGNOSIS — M7742 Metatarsalgia, left foot: Principal | ICD-10-CM

## 2014-03-25 DIAGNOSIS — G629 Polyneuropathy, unspecified: Secondary | ICD-10-CM

## 2014-03-25 DIAGNOSIS — M2040 Other hammer toe(s) (acquired), unspecified foot: Secondary | ICD-10-CM

## 2014-03-25 DIAGNOSIS — M79609 Pain in unspecified limb: Secondary | ICD-10-CM

## 2014-03-25 DIAGNOSIS — M7741 Metatarsalgia, right foot: Secondary | ICD-10-CM

## 2014-03-25 DIAGNOSIS — M7750 Other enthesopathy of unspecified foot: Secondary | ICD-10-CM

## 2014-03-25 DIAGNOSIS — L603 Nail dystrophy: Secondary | ICD-10-CM

## 2014-03-25 DIAGNOSIS — L84 Corns and callosities: Secondary | ICD-10-CM

## 2014-03-25 DIAGNOSIS — L608 Other nail disorders: Secondary | ICD-10-CM

## 2014-03-25 DIAGNOSIS — G589 Mononeuropathy, unspecified: Secondary | ICD-10-CM

## 2014-03-25 DIAGNOSIS — M79673 Pain in unspecified foot: Secondary | ICD-10-CM

## 2014-03-25 MED ORDER — GABAPENTIN 300 MG PO CAPS: 300.0000 mg | ORAL_CAPSULE | Freq: Every day | ORAL | Status: DC

## 2014-03-25 NOTE — Patient Instructions (Signed)
We do not carry any inserts that medicare will cover aside from Diabetic inserts that are a special order product.  You can bring by the insert you have received from Dr. Fritzi Mandes

## 2014-03-25 NOTE — Progress Notes (Signed)
Subjective: Patient presents today with a multiple list of problems and questions. She would like her toenails trimmed, she has corns on her fifth toes bilateral, she has a thick right great toe, she has problems with her toes going forward in her shoes, she would like an arch support like the kind she received from Dr. Fritzi Mandes when she was her patient before she changed insurance, and she complains of terrible burning on the bottom of her feet.  She is not diabetic.  Objective: Vascular examination is intact with palpable pedal pulses DP and PT bilateral. Neurological sensation is also intact both epicriticaly and protectively. She has a prominent plantarflexed metatarsals with fat pad atrophy noted at the metatarsal heads. Mild flexible contracture of the lesser digits is also present. Right hallux has a mild dorsal contracture from the extensor hallucis longus tendon. The right hallux nail is thickened and dystrophic. The remainder of the nails are elongated and uncomfortable. Slight hammertoe contracture of bilateral fifth digits is noted with overlying callus is present. High arched foot type is present bilateral.  Assessment: Symptomatic toenails x10, onychodystrophy right great toenail, hammertoe fifth digits bilateral with associated corns, high arched foot type, contracture of lesser digits, neuropathy  Plan: Discussed treatment options and alternatives and I did my best to address all of the issues that she had on her list today. For the burning I recommended a medication of gabapentin as she has failed conservative treatment. 300 mg by mouth each bedtime was written. Toenails are debrided today without complication. Corns are also debrided today without complication. Discussed proper fitting shoes as it sounds like a shoe she has purchased have either been too large or too small. Discussed the inserts that she got through Medicare and told her that I don't know what she is referring to as we do not  carried item in our office. She would like to bring the insert by however likely I will not he would get this for her. She will have to see Dr. Fritzi Mandes again if she would like this insert. I did offer her a power step inserts however she does not want to pay for it if she can get some through Medicare.  She will be seen back for routine care in 3 months and will call of problems arise in the meantime

## 2014-04-04 ENCOUNTER — Ambulatory Visit (INDEPENDENT_AMBULATORY_CARE_PROVIDER_SITE_OTHER): Payer: Commercial Managed Care - HMO | Admitting: Cardiology

## 2014-04-04 ENCOUNTER — Ambulatory Visit (INDEPENDENT_AMBULATORY_CARE_PROVIDER_SITE_OTHER): Payer: Commercial Managed Care - HMO | Admitting: Pharmacist

## 2014-04-04 ENCOUNTER — Encounter: Payer: Self-pay | Admitting: Cardiology

## 2014-04-04 VITALS — BP 120/80 | HR 85 | Ht 60.0 in | Wt 147.0 lb

## 2014-04-04 DIAGNOSIS — Z7901 Long term (current) use of anticoagulants: Secondary | ICD-10-CM

## 2014-04-04 DIAGNOSIS — I119 Hypertensive heart disease without heart failure: Secondary | ICD-10-CM

## 2014-04-04 DIAGNOSIS — I4891 Unspecified atrial fibrillation: Secondary | ICD-10-CM

## 2014-04-04 DIAGNOSIS — Z5181 Encounter for therapeutic drug level monitoring: Secondary | ICD-10-CM

## 2014-04-04 DIAGNOSIS — I5032 Chronic diastolic (congestive) heart failure: Secondary | ICD-10-CM

## 2014-04-04 DIAGNOSIS — I482 Chronic atrial fibrillation, unspecified: Secondary | ICD-10-CM

## 2014-04-04 LAB — POCT INR: INR: 2.9

## 2014-04-04 NOTE — Patient Instructions (Signed)
**Note De-identified  Obfuscation** Your physician recommends that you continue on your current medications as directed. Please refer to the Current Medication list given to you today.  Your physician recommends that you schedule a follow-up appointment in: 4 months  

## 2014-04-04 NOTE — Assessment & Plan Note (Signed)
Her symptoms of exertional dyspnea are controlled with furosemide.  She does not weigh herself everyday but if she fears that she is gaining weight then she will check her scale.  She knows that she can take an extra 20 mg Lasix on a when necessary basis for weight gain or increased peripheral edema.

## 2014-04-04 NOTE — Progress Notes (Signed)
Kristen Whitehead Date of Birth:  07-Jan-1923 [facility] [address] [place],   [address] [phone]        Fax   [phone]   History of Present Illness: This pleasant 80 year old woman is seen for a scheduled followup office visit. She has a history of established atrial fibrillation. She does not have any history of ischemic heart disease. She had a normal Myoview stress test in January 2011. Kristen Whitehead saw her as a work in on 03/12/12 and she had another Myoview on 03/18/12 which showed no evidence of ischemia and her ejection fraction was 75%. In January 2014 she was seen by Kristen Whitehead. She subsequently had an update of her echocardiogram on 09/17/12 which showed an ejection fraction of 60-65% and biatrial enlargement. There was mild mitral regurgitation.  The patient has a history of compensated diastolic congestive heart failure. [name] did a B. natruretic peptide on 01/27/13 which was elevated at 443 the patient had electrolytes drawn on 03/04/13 showing BUN of 8 and creatinine 0.61  . The patient had a chest x-ray on 03/21/13 which showed normal heart size and clear lungs.  She has been doing well on furosemide 40 mg daily.  She has chronic diastolic heart failure.  Her last echocardiogram on 03/01/14 showed an ejection fraction of 65-70% and there was mild aortic insufficiency and mild mitral regurgitation.  Her chest x-ray on 02/17/14 showed normal heart size and clear lungs.  Current Outpatient Prescriptions  Medication Sig Dispense Refill  . amLODipine (NORVASC) 2.5 MG tablet Take 1 tablet (2.5 mg total) by mouth daily.  90 tablet  3  . atenolol (TENORMIN) 25 MG tablet THREE TIMES DAILY AS DIRECTED      . augmented betamethasone dipropionate (DIPROLENE-AF) 0.05 % cream Apply 1 application topically daily.      . beta carotene w/minerals (OCUVITE) tablet Take 1 tablet by mouth daily.      . calcium carbonate (OS-CAL - DOSED IN MG OF ELEMENTAL  CALCIUM) 1250 MG tablet Take 1 tablet by mouth daily.      . Cholecalciferol (VITAMIN D) 2000 UNITS tablet Take 2,000 Units by mouth daily.       Marland Kitchen estradiol (ESTRACE VAGINAL) 0.1 MG/GM vaginal cream Place 2 g vaginally 2 (two) times a week.       . fluocinonide cream (LIDEX) 0.05 %       . fluticasone (FLONASE) 50 MCG/ACT nasal spray as needed.       . furosemide (LASIX) 20 MG tablet Take 2 tablets (40 mg total) by mouth daily.  180 tablet  0  . HYDROcodone-acetaminophen (NORCO/VICODIN) 5-325 MG per tablet Take 1 tablet by mouth as needed.      . isosorbide mononitrate (IMDUR) 30 MG 24 hr tablet TAKE 1 TABLET EVERY DAY  90 tablet  0  . latanoprost (XALATAN) 0.005 % ophthalmic solution Place 1 drop into the left eye at bedtime.       Marland Kitchen levothyroxine (SYNTHROID, LEVOTHROID) 75 MCG tablet Take 75 mcg by mouth daily.        Marland Kitchen NITROSTAT 0.4 MG SL tablet DISSOLVE 1 TABLET UNDER THE TONGUE EVERY 5 MINUTES FOR 3 DOSES AS NEEDED  FOR  CHEST  PAIN  25 tablet  5  . potassium chloride (K-DUR) 10 MEQ tablet Take 10 mEq by mouth 2 (two) times daily.      . pramipexole (MIRAPEX) 0.125 MG tablet Take 1 tablet (0.125 mg total) by  mouth every evening.  90 tablet  3  . PREMARIN vaginal cream       . temazepam (RESTORIL) 15 MG capsule Take 15 mg by mouth at bedtime.      . vitamin C (ASCORBIC ACID) 500 MG tablet Take 500 mg by mouth daily.      Marland Kitchen warfarin (COUMADIN) 5 MG tablet TAKE AS DIRECTED BY ANTICOAGULATION CLINIC  90 tablet  1  . FLUZONE HIGH-DOSE injection       . gabapentin (NEURONTIN) 300 MG capsule Take 1 capsule (300 mg total) by mouth at bedtime.  30 capsule  2   No current facility-administered medications for this visit.    Allergies  Allergen Reactions  . Demerol   . Ebastine     EBS  . Ibuprofen   . Statins     myalgias  . Tetanus Toxoids   . Tetracyclines & Related     Patient Active Problem List   Diagnosis Date Noted  . Chronic diastolic heart failure 02/63/7858  . Dyspnea  02/01/2014  . Encounter for therapeutic drug monitoring 09/16/2013  . Chest pain at rest 03/12/2012  . Cough 09/16/2011  . Fatigue 04/08/2011  . Benign hypertensive heart disease without heart failure 02/11/2011  . Mitral regurgitation 02/11/2011  . Hypothyroidism 02/11/2011  . Hypercholesterolemia 02/11/2011  . Atrial fibrillation 12/03/2010    History  Smoking status  . Never Smoker   Smokeless tobacco  . Never Used    History  Alcohol Use No    Family History  Problem Relation Age of Onset  . Heart attack Neg Hx   . Heart disease Neg Hx   . Stroke Mother   . Stroke Father     Review of Systems: Constitutional: no fever chills diaphoresis or fatigue or change in weight.  Head and neck: no hearing loss, no epistaxis, no photophobia or visual disturbance. Respiratory: No cough, shortness of breath or wheezing. Cardiovascular: No chest pain peripheral edema, palpitations. Gastrointestinal: No abdominal distention, no abdominal pain, no change in bowel habits hematochezia or melena. Genitourinary: No dysuria, no frequency, no urgency, no nocturia. Musculoskeletal:No arthralgias, no back pain, no gait disturbance or myalgias. Neurological: No dizziness, no headaches, no numbness, no seizures, no syncope, no weakness, no tremors. Hematologic: No lymphadenopathy, no easy bruising. Psychiatric: No confusion, no hallucinations, no sleep disturbance.    Physical Exam: Filed Vitals:   04/04/14 1123  BP: 120/80  Pulse: 85   the general appearance reveals an elderly woman in no acute distress.The head and neck exam reveals pupils equal and reactive.  Extraocular movements are full.  There is no scleral icterus.  The mouth and pharynx are normal.  The neck is supple.  The carotids reveal no bruits.  The jugular venous pressure is normal.  The  thyroid is not enlarged.  There is no lymphadenopathy.  The chest is clear to percussion and auscultation.  There are no rales or  rhonchi.  Expansion of the chest is symmetrical.  The precordium is quiet.  The pulse is irregularly irregular. The first heart sound is normal.  The second heart sound is physiologically split.  There is a soft apical systolic murmur.  No diastolic murmur.  No rub.  There is no abnormal lift or heave.  The abdomen is soft and nontender.  The bowel sounds are normal.  The liver and spleen are not enlarged.  There are no abdominal masses.  There are no abdominal bruits.  Extremities reveal good pedal pulses.  There is no phlebitis or edema.  There is no cyanosis or clubbing.  Strength is normal and symmetrical in all extremities.  There is no lateralizing weakness.  There are no sensory deficits.  The skin is warm and dry.  There is no rash.     Assessment / Plan:  1. permanent atrial fibrillation. 2.  Chronic diastolic heart failure 3. mitral regurgitation, mild by echo 4.  Mild aortic insufficiency by echo 5.  Hypertensive heart disease. 6. Hypercholesterolemia 7. hypothyroidism  Disposition: Continue same medications.  Recheck in 4 months for office visit and EKG

## 2014-04-04 NOTE — Assessment & Plan Note (Signed)
Her blood pressure tends to run high at times in the 140s.  She is tolerating low dose amlodipine without excessive peripheral edema.  We will continue current antihypertensive therapy.  When she does have edema the left leg swells more than the right leg.

## 2014-04-04 NOTE — Assessment & Plan Note (Signed)
She is in permanent atrial fibrillation.  She really is not aware of her heartbeat.  She is on long-term Coumadin.  She has not had any TIA or stroke symptoms.

## 2014-04-15 ENCOUNTER — Other Ambulatory Visit: Payer: Self-pay

## 2014-04-15 DIAGNOSIS — Z1231 Encounter for screening mammogram for malignant neoplasm of breast: Secondary | ICD-10-CM

## 2014-04-20 ENCOUNTER — Telehealth: Payer: Self-pay | Admitting: Cardiology

## 2014-04-20 NOTE — Telephone Encounter (Signed)
Spoke with pt and she noticed the blood shot eye on Monday, went to [place] after hours clinic last night. She states her INR last night was 3.2 and MD at [facility] instructed her to skip last night's dosage. She states she hasn't been eating as many leafy green vegetables but will increase her intake. Instructed her if vision changes to follow up with her eye doctor.

## 2014-04-20 NOTE — Telephone Encounter (Signed)
New message           Pt was seen in urgent care last for a broken vein in right eye / does she need to come in for her coumadin earlier than 9/14

## 2014-04-21 ENCOUNTER — Ambulatory Visit: Payer: Commercial Managed Care - HMO

## 2014-05-02 ENCOUNTER — Ambulatory Visit (INDEPENDENT_AMBULATORY_CARE_PROVIDER_SITE_OTHER): Payer: Commercial Managed Care - HMO | Admitting: *Deleted

## 2014-05-02 DIAGNOSIS — Z5181 Encounter for therapeutic drug level monitoring: Secondary | ICD-10-CM

## 2014-05-02 DIAGNOSIS — I4891 Unspecified atrial fibrillation: Secondary | ICD-10-CM

## 2014-05-02 DIAGNOSIS — Z7901 Long term (current) use of anticoagulants: Secondary | ICD-10-CM

## 2014-05-02 LAB — POCT INR: INR: 2.3

## 2014-05-04 ENCOUNTER — Encounter (INDEPENDENT_AMBULATORY_CARE_PROVIDER_SITE_OTHER): Payer: Commercial Managed Care - HMO | Admitting: Ophthalmology

## 2014-05-04 DIAGNOSIS — H3530 Unspecified macular degeneration: Secondary | ICD-10-CM

## 2014-05-04 DIAGNOSIS — H35349 Macular cyst, hole, or pseudohole, unspecified eye: Secondary | ICD-10-CM

## 2014-05-04 DIAGNOSIS — H35039 Hypertensive retinopathy, unspecified eye: Secondary | ICD-10-CM

## 2014-05-04 DIAGNOSIS — H43819 Vitreous degeneration, unspecified eye: Secondary | ICD-10-CM

## 2014-05-04 DIAGNOSIS — H2700 Aphakia, unspecified eye: Secondary | ICD-10-CM

## 2014-05-04 DIAGNOSIS — I10 Essential (primary) hypertension: Secondary | ICD-10-CM

## 2014-05-09 ENCOUNTER — Other Ambulatory Visit: Payer: Self-pay | Admitting: Cardiology

## 2014-05-11 ENCOUNTER — Other Ambulatory Visit: Payer: Self-pay

## 2014-05-11 MED ORDER — POTASSIUM CHLORIDE ER 10 MEQ PO TBCR: 10.0000 meq | EXTENDED_RELEASE_TABLET | Freq: Two times a day (BID) | ORAL | Status: DC

## 2014-05-30 ENCOUNTER — Ambulatory Visit (INDEPENDENT_AMBULATORY_CARE_PROVIDER_SITE_OTHER): Payer: Commercial Managed Care - HMO

## 2014-05-30 DIAGNOSIS — Z7901 Long term (current) use of anticoagulants: Secondary | ICD-10-CM

## 2014-05-30 DIAGNOSIS — Z5181 Encounter for therapeutic drug level monitoring: Secondary | ICD-10-CM

## 2014-05-30 DIAGNOSIS — I4891 Unspecified atrial fibrillation: Secondary | ICD-10-CM

## 2014-05-30 LAB — POCT INR: INR: 4

## 2014-06-13 ENCOUNTER — Ambulatory Visit (INDEPENDENT_AMBULATORY_CARE_PROVIDER_SITE_OTHER): Payer: Commercial Managed Care - HMO | Admitting: Pharmacist

## 2014-06-13 DIAGNOSIS — Z7901 Long term (current) use of anticoagulants: Secondary | ICD-10-CM

## 2014-06-13 DIAGNOSIS — Z5181 Encounter for therapeutic drug level monitoring: Secondary | ICD-10-CM

## 2014-06-13 DIAGNOSIS — I4891 Unspecified atrial fibrillation: Secondary | ICD-10-CM

## 2014-06-13 LAB — POCT INR: INR: 3

## 2014-06-24 ENCOUNTER — Telehealth: Payer: Self-pay | Admitting: *Deleted

## 2014-06-24 NOTE — Telephone Encounter (Signed)
Called pt and lft VM to notify her of the apt date and time change due to Dr. Guadelupe Sabin schedule changes. Tef

## 2014-06-29 ENCOUNTER — Ambulatory Visit: Payer: Commercial Managed Care - HMO | Admitting: Neurology

## 2014-06-30 ENCOUNTER — Ambulatory Visit: Payer: Commercial Managed Care - HMO | Admitting: Neurology

## 2014-07-04 ENCOUNTER — Ambulatory Visit (INDEPENDENT_AMBULATORY_CARE_PROVIDER_SITE_OTHER): Payer: Commercial Managed Care - HMO | Admitting: Pharmacist

## 2014-07-04 DIAGNOSIS — Z5181 Encounter for therapeutic drug level monitoring: Secondary | ICD-10-CM

## 2014-07-04 DIAGNOSIS — I4891 Unspecified atrial fibrillation: Secondary | ICD-10-CM

## 2014-07-04 DIAGNOSIS — Z7901 Long term (current) use of anticoagulants: Secondary | ICD-10-CM

## 2014-07-04 LAB — POCT INR: INR: 3.5

## 2014-07-21 ENCOUNTER — Ambulatory Visit (INDEPENDENT_AMBULATORY_CARE_PROVIDER_SITE_OTHER): Payer: Commercial Managed Care - HMO | Admitting: Neurology

## 2014-07-21 ENCOUNTER — Encounter: Payer: Self-pay | Admitting: Neurology

## 2014-07-21 VITALS — BP 124/71 | HR 91 | Temp 98.3°F | Ht 61.0 in | Wt 146.0 lb

## 2014-07-21 DIAGNOSIS — G2581 Restless legs syndrome: Secondary | ICD-10-CM

## 2014-07-21 MED ORDER — PRAMIPEXOLE DIHYDROCHLORIDE 0.125 MG PO TABS: 0.1250 mg | ORAL_TABLET | Freq: Every evening | ORAL | Status: DC

## 2014-07-21 NOTE — Progress Notes (Signed)
Subjective:    Patient ID: Kristen Whitehead is a 80 y.o. female.  HPI     Interim history:   [name] is a very pleasant 80 year old right-handed woman with an underlying medical history of hypertension, hyperlipidemia, atrial fibrillation, celiac disease, scoliosis, reflux disease, who presents for followup consultation of her bilateral foot pain, likely RLS. She is accompanied by a  friend, Bailey Mech, today. I last saw her on 12/27/2013, at which time she noted ankle swelling. She had gone into cardiac failure the year before and felt similar to that. Nevertheless, because of her lower extremity swelling I suggested she cut back on Mirapex to 0.125 mg each night and see if the swelling improves. If she had flareup in her restless leg symptoms I suggested she could go back to 0.25 mg nightly.  Today, she reports that she had a fall about 3 months ago. She tripped over something. She fell in the house, on hardwood floor, her head hit the table. No soreness, no headache. Thankfully, no sequelae. She does not always drink enough water. As far as her burning foot pain, she feels the same. She has sometimes cramping and different areas of her body. Her leg swelling is about the same.    I saw her on 06/28/2013, at which time I noted on clinical exam that she had mild evidence of neuropathy in the absence of a history of diabetes with normal hemoglobin A1c noted and normal B12 levels. She had EMG and nerve conduction studies that did not show any significant neuropathy. I felt that she had most likely RLS. She had done well with Mirapex. I did not make any changes to her medication regimen and asked her to continue taking Mirapex 0.25 mg each night. She did not have any side effects at the time.  I first met her on 02/17/2013, at which time I suggested EMG and nerve conduction testing and a treatment trial with Mirapex because of possible underlying RLS. She has previously seen a podiatrist who had  done a nerve biopsy which she reported was normal. She has a history of A. fib. She has a history of congestive heart failure and is followed by cardiology. Echocardiogram in January 2014 showed biatrial enlargement and mild mitral regurgitation and an EF of 28-78% with diastolic dysfunction.   For years she has had burning in her feet and started having a crawling sensation in her distal legs in 2014. She has also had cramping in her muscles for years and she was taken off of her statin in 2013 and her cramping eventually became better. She has had pins and needles sensation and it helps to walk around. She has worse symptoms at night or by the end of the day. She remembers having "terrible growing pains" as a child and does not have a FHx of RLS as far as she knows. She had nerve biopsies both legs by her podiatrist, which did not show inflammation. While she has a history of chronic back pain she denied any radiating back pain to her legs. In fact she described pins and needle and crawling sensation in the distal lower extremities, radiating upwards. She has been taking OTC Mg supplements, which helped her cramps.    Her EMG and nerve conduction testing from 03/03/2013 showed no evidence of peripheral neuropathy. We called her with her test results. Her foot pain and paresthesias improved.  Her Past Medical History Is Significant For: Past Medical History  Diagnosis Date  . History  of mitral valve prolapse 06/15/2008    a. echo 1/14: mild LVH, EF 60-65%, mild MR, mild to mod BAE, PASP 35  . Chronic atrial fibrillation     managed with rate control and coumadin  . History of congenital mitral regurgitation     mild  . Hypothyroidism   . Hypercholesterolemia   . GERD (gastroesophageal reflux disease)   . Chest pain     no known ischemic heart disease; negative Myoview July 2013. EF 75% with no ischemia.   . Hypertension   . Bladder prolapse, female, acquired   . Chronic diastolic heart failure    . Chronic anticoagulation   . Heart disease   . Cancer     thyroid    Her Past Surgical History Is Significant For: Past Surgical History  Procedure Laterality Date  . Thyroidectomy    . Cholecystectomy    . Abdominal hysterectomy      Her Family History Is Significant For: Family History  Problem Relation Age of Onset  . Heart attack Neg Hx   . Heart disease Neg Hx   . Stroke Mother   . Stroke Father     Her Social History Is Significant For: History   Social History  . Marital Status: Widowed    Spouse Name: N/A    Number of Children: 3  . Years of Education: BA   Occupational History  .     Social History Main Topics  . Smoking status: Never Smoker   . Smokeless tobacco: Never Used  . Alcohol Use: No  . Drug Use: No  . Sexual Activity: No   Other Topics Concern  . None   Social History Narrative   Pt lives at home alone.   Caffeine Use: quit 52yr ago    Her Allergies Are:  Allergies  Allergen Reactions  . Demerol   . Ebastine     EBS  . Ibuprofen   . Statins     myalgias  . Tetanus Toxoids   . Tetracyclines & Related   :   Her Current Medications Are:  Outpatient Encounter Prescriptions as of 07/21/2014  Medication Sig  . amLODipine (NORVASC) 2.5 MG tablet TAKE 1 TABLET EVERY DAY  . atenolol (TENORMIN) 25 MG tablet TAKE 1 TABLET  TWO  TO THREE TIMES DAILY AS DIRECTED  . augmented betamethasone dipropionate (DIPROLENE-AF) 0.05 % cream Apply 1 application topically daily.  . beta carotene w/minerals (OCUVITE) tablet Take 1 tablet by mouth daily.  . calcium carbonate (OS-CAL - DOSED IN MG OF ELEMENTAL CALCIUM) 1250 MG tablet Take 1 tablet by mouth daily.  . Cholecalciferol (VITAMIN D) 2000 UNITS tablet Take 2,000 Units by mouth daily.   .Marland Kitchenestradiol (ESTRACE VAGINAL) 0.1 MG/GM vaginal cream Place 2 g vaginally 2 (two) times a week.   . fluocinonide cream (LIDEX) 0.05 %   . fluticasone (FLONASE) 50 MCG/ACT nasal spray as needed.   .Marland KitchenFLUZONE  HIGH-DOSE injection   . furosemide (LASIX) 20 MG tablet TAKE 2 TABLETS EVERY DAY  . gabapentin (NEURONTIN) 300 MG capsule Take 1 capsule (300 mg total) by mouth at bedtime.  .Marland KitchenHYDROcodone-acetaminophen (NORCO/VICODIN) 5-325 MG per tablet Take 1 tablet by mouth as needed.  . isosorbide mononitrate (IMDUR) 30 MG 24 hr tablet TAKE 1 TABLET EVERY DAY  . latanoprost (XALATAN) 0.005 % ophthalmic solution Place 1 drop into the left eye at bedtime.   .Marland Kitchenlevothyroxine (SYNTHROID, LEVOTHROID) 75 MCG tablet Take 75 mcg by mouth daily.    .Marland Kitchen  NITROSTAT 0.4 MG SL tablet DISSOLVE 1 TABLET UNDER THE TONGUE EVERY 5 MINUTES FOR 3 DOSES AS NEEDED  FOR  CHEST  PAIN  . potassium chloride (K-DUR) 10 MEQ tablet Take 1 tablet (10 mEq total) by mouth 2 (two) times daily.  . pramipexole (MIRAPEX) 0.125 MG tablet Take 1 tablet (0.125 mg total) by mouth every evening.  Marland Kitchen PREMARIN vaginal cream   . temazepam (RESTORIL) 15 MG capsule Take 15 mg by mouth at bedtime.  . vitamin C (ASCORBIC ACID) 500 MG tablet Take 500 mg by mouth daily.  Marland Kitchen warfarin (COUMADIN) 5 MG tablet TAKE AS DIRECTED BY ANTICOAGULATION CLINIC  :  Review of Systems:  Out of a complete 14 point review of systems, all are reviewed and negative with the exception of these symptoms as listed below:   Review of Systems  Constitutional: Positive for activity change and fatigue.  HENT:       Ringing in ears  Eyes: Positive for redness.       Loss of vision  Respiratory: Positive for cough and shortness of breath.   Cardiovascular: Positive for leg swelling.       Murmur  Gastrointestinal: Positive for abdominal pain and diarrhea.       Swollen abdomen  Endocrine: Positive for cold intolerance.       Excessive eating  Musculoskeletal: Positive for gait problem.       Aching muscles, muscle cramps, neck stiffness  Neurological: Positive for dizziness and seizures.       Insomnia, restless legs, frequent waking  Hematological: Bruises/bleeds easily.     Objective:  Neurologic Exam  Physical Exam Physical Examination:   Filed Vitals:   07/21/14 1110  BP: 124/71  Pulse: 91  Temp: 98.3 F (36.8 C)    General Examination: The patient is a very pleasant 80 y.o. female in no acute distress. She appears well-developed and well-nourished and well groomed.   HEENT: Normocephalic, atraumatic, pupils are equal, round and reactive to light and accommodation. She is s/p bilateral cataract repairs. She has what appears to be a scar in the medial aspect of her R cornea. Extraocular tracking is good without limitation to gaze excursion or nystagmus noted. Normal smooth pursuit is noted. Her funduscopy was normal. Hearing is grossly intact. Face is symmetric with normal facial animation and normal facial sensation. Speech is clear with no dysarthria noted. There is no hypophonia. There is no lip, neck/head, jaw or voice tremor. Neck is supple with full range of passive and active motion. There are no carotid bruits on auscultation. Oropharynx exam reveals: mild mouth dryness, adequate dental hygiene and no airway crowding. Tongue protrudes centrally and palate elevates symmetrically.    Chest: Clear to auscultation without wheezing, rhonchi or crackles noted.  Heart: S1+S2+0, irregularly irregular.    Abdomen: Soft, non-tender and non-distended with normal bowel sounds appreciated on auscultation.  Extremities: There is 1+ pitting edema in the distal lower extremities bilaterally, unchanged. Pedal pulses are intact.  Skin: Warm and dry without trophic changes noted. There are no varicose veins.  Musculoskeletal: exam reveals no obvious joint deformities, tenderness or joint swelling or erythema.   Neurologically:  Mental status: The patient is awake, alert and oriented in all 4 spheres. Her memory, attention, language and knowledge are appropriate. There is no aphasia, agnosia, apraxia or anomia. Speech is clear with normal prosody and  enunciation. Thought process is linear. Mood is congruent and affect is normal.  Cranial nerves are as described above  under HEENT exam. In addition, shoulder shrug is normal with equal shoulder height noted. Motor exam: Normal bulk, strength and tone is noted. There is no drift, tremor or rebound. Romberg is negative. Reflexes are 1+ in the UEs and trace in the knees and absent in both ankles. Toes are downgoing bilaterally. Fine motor skills are intact with normal finger taps, normal hand movements, normal rapid alternating patting, normal foot taps and normal foot agility.  Cerebellar testing shows no dysmetria or intention tremor on finger to nose testing. There is no truncal or gait ataxia.  Sensory exam is intact to light touch, pinprick, vibration, temperature sense in the upper extremities, but mildly decreased to all modalities in the distal LEs.  Gait, station and balance: she stands with no significant difficulty. No veering to one side is noted. No leaning to one side is noted. Posture is age-appropriate and stance is slightly wide-based. No problems turning are noted. Her R foot tends to point outward some, unchanged.  she walks cautiously. She does not use any assistive device.   Assessment and Plan:   In summary, CHAYAH MCKEE is a very pleasant 80 year old female with a history of leg and foot pain. Her history and physical exam are consistent with mild neuropathy. She has had symptoms for 10 years with very little progression of at all. She's not diabetic and had a normal B12 level. Her exam is stable and she may have a combination of mild PN and RLS. Her EMG/NCV from last year did not show any evidence of significant neuropathy. She has done fairly well with Mirapex low dose, which I reduced last time because of lower extremity swelling. This seems stable. She has an appointment with [name] in June. She has an appointment with her new eye Dr. next year in January. She is on  pramipexole 0.125 mg each night.  for her to continue at this current dose. I will see her back in about 6 months, sooner if need be. I answered all her questions today and the patient was in agreement.

## 2014-07-21 NOTE — Patient Instructions (Signed)
We will continue with the pramipexole at 0.125 mg once daily at night.

## 2014-07-26 ENCOUNTER — Ambulatory Visit (INDEPENDENT_AMBULATORY_CARE_PROVIDER_SITE_OTHER): Payer: Commercial Managed Care - HMO

## 2014-07-26 DIAGNOSIS — Z7901 Long term (current) use of anticoagulants: Secondary | ICD-10-CM

## 2014-07-26 DIAGNOSIS — Z5181 Encounter for therapeutic drug level monitoring: Secondary | ICD-10-CM

## 2014-07-26 DIAGNOSIS — I4891 Unspecified atrial fibrillation: Secondary | ICD-10-CM

## 2014-07-26 LAB — POCT INR: INR: 3.1

## 2014-08-01 ENCOUNTER — Emergency Department (HOSPITAL_COMMUNITY)
Admission: EM | Admit: 2014-08-01 | Discharge: 2014-08-01 | Disposition: A | Discharge status: Home / Self Care | Payer: Commercial Managed Care - HMO | Attending: Emergency Medicine | Admitting: Emergency Medicine

## 2014-08-01 ENCOUNTER — Encounter (HOSPITAL_COMMUNITY): Payer: Self-pay | Admitting: Emergency Medicine

## 2014-08-01 ENCOUNTER — Emergency Department (HOSPITAL_COMMUNITY): Payer: Commercial Managed Care - HMO

## 2014-08-01 DIAGNOSIS — Z79899 Other long term (current) drug therapy: Secondary | ICD-10-CM | POA: Diagnosis not present

## 2014-08-01 DIAGNOSIS — E039 Hypothyroidism, unspecified: Secondary | ICD-10-CM | POA: Insufficient documentation

## 2014-08-01 DIAGNOSIS — Z8742 Personal history of other diseases of the female genital tract: Secondary | ICD-10-CM | POA: Diagnosis not present

## 2014-08-01 DIAGNOSIS — I5032 Chronic diastolic (congestive) heart failure: Secondary | ICD-10-CM | POA: Insufficient documentation

## 2014-08-01 DIAGNOSIS — Z7901 Long term (current) use of anticoagulants: Secondary | ICD-10-CM | POA: Diagnosis not present

## 2014-08-01 DIAGNOSIS — Z7952 Long term (current) use of systemic steroids: Secondary | ICD-10-CM | POA: Diagnosis not present

## 2014-08-01 DIAGNOSIS — K5732 Diverticulitis of large intestine without perforation or abscess without bleeding: Secondary | ICD-10-CM

## 2014-08-01 DIAGNOSIS — R1030 Lower abdominal pain, unspecified: Secondary | ICD-10-CM | POA: Diagnosis present

## 2014-08-01 DIAGNOSIS — Z85850 Personal history of malignant neoplasm of thyroid: Secondary | ICD-10-CM | POA: Diagnosis not present

## 2014-08-01 DIAGNOSIS — G8929 Other chronic pain: Secondary | ICD-10-CM | POA: Insufficient documentation

## 2014-08-01 DIAGNOSIS — K5730 Diverticulosis of large intestine without perforation or abscess without bleeding: Secondary | ICD-10-CM | POA: Diagnosis not present

## 2014-08-01 DIAGNOSIS — I482 Chronic atrial fibrillation: Secondary | ICD-10-CM | POA: Insufficient documentation

## 2014-08-01 DIAGNOSIS — Z8719 Personal history of other diseases of the digestive system: Secondary | ICD-10-CM | POA: Insufficient documentation

## 2014-08-01 DIAGNOSIS — E780 Pure hypercholesterolemia: Secondary | ICD-10-CM | POA: Insufficient documentation

## 2014-08-01 DIAGNOSIS — I10 Essential (primary) hypertension: Secondary | ICD-10-CM | POA: Insufficient documentation

## 2014-08-01 DIAGNOSIS — Q233 Congenital mitral insufficiency: Secondary | ICD-10-CM | POA: Diagnosis not present

## 2014-08-01 LAB — URINALYSIS, ROUTINE W REFLEX MICROSCOPIC
BILIRUBIN URINE: NEGATIVE
Glucose, UA: NEGATIVE mg/dL
KETONES UR: NEGATIVE mg/dL
NITRITE: NEGATIVE
PROTEIN: NEGATIVE mg/dL
Specific Gravity, Urine: 1.008 (ref 1.005–1.030)
UROBILINOGEN UA: 0.2 mg/dL (ref 0.0–1.0)
pH: 7.5 (ref 5.0–8.0)

## 2014-08-01 LAB — COMPREHENSIVE METABOLIC PANEL
ALK PHOS: 75 U/L (ref 39–117)
ALT: 18 U/L (ref 0–35)
ANION GAP: 14 (ref 5–15)
AST: 28 U/L (ref 0–37)
Albumin: 3.6 g/dL (ref 3.5–5.2)
BILIRUBIN TOTAL: 0.7 mg/dL (ref 0.3–1.2)
BUN: 9 mg/dL (ref 6–23)
CHLORIDE: 98 meq/L (ref 96–112)
CO2: 24 mEq/L (ref 19–32)
Calcium: 8.4 mg/dL (ref 8.4–10.5)
Creatinine, Ser: 0.65 mg/dL (ref 0.50–1.10)
GFR, EST AFRICAN AMERICAN: 89 mL/min — AB (ref >90)
GFR, EST NON AFRICAN AMERICAN: 76 mL/min — AB (ref >90)
GLUCOSE: 113 mg/dL — AB (ref 70–99)
POTASSIUM: 4.1 meq/L (ref 3.7–5.3)
Sodium: 136 mEq/L — ABNORMAL LOW (ref 137–147)
Total Protein: 7.2 g/dL (ref 6.0–8.3)

## 2014-08-01 LAB — CBC WITH DIFFERENTIAL/PLATELET
BASOS PCT: 0 % (ref 0–1)
Basophils Absolute: 0 10*3/uL (ref 0.0–0.1)
EOS ABS: 0.1 10*3/uL (ref 0.0–0.7)
Eosinophils Relative: 1 % (ref 0–5)
HEMATOCRIT: 40.2 % (ref 36.0–46.0)
Hemoglobin: 13.3 g/dL (ref 12.0–15.0)
Lymphocytes Relative: 21 % (ref 12–46)
Lymphs Abs: 1.8 10*3/uL (ref 0.7–4.0)
MCH: 31.2 pg (ref 26.0–34.0)
MCHC: 33.1 g/dL (ref 30.0–36.0)
MCV: 94.4 fL (ref 78.0–100.0)
MONO ABS: 1.1 10*3/uL — AB (ref 0.1–1.0)
MONOS PCT: 13 % — AB (ref 3–12)
NEUTROS ABS: 5.5 10*3/uL (ref 1.7–7.7)
Neutrophils Relative %: 65 % (ref 43–77)
Platelets: 197 10*3/uL (ref 150–400)
RBC: 4.26 MIL/uL (ref 3.87–5.11)
RDW: 14.6 % (ref 11.5–15.5)
WBC: 8.5 10*3/uL (ref 4.0–10.5)

## 2014-08-01 LAB — URINE MICROSCOPIC-ADD ON

## 2014-08-01 LAB — LIPASE, BLOOD: Lipase: 30 U/L (ref 11–59)

## 2014-08-01 LAB — PROTIME-INR
INR: 3.01 — ABNORMAL HIGH (ref 0.00–1.49)
Prothrombin Time: 31.5 seconds — ABNORMAL HIGH (ref 11.6–15.2)

## 2014-08-01 LAB — PRO B NATRIURETIC PEPTIDE: Pro B Natriuretic peptide (BNP): 2372 pg/mL — ABNORMAL HIGH (ref 0–450)

## 2014-08-01 MED ORDER — AMOXICILLIN-POT CLAVULANATE 875-125 MG PO TABS: 1.0000 | ORAL_TABLET | Freq: Two times a day (BID) | ORAL | Status: DC

## 2014-08-01 MED ORDER — DOCUSATE SODIUM 100 MG PO CAPS: 100.0000 mg | ORAL_CAPSULE | Freq: Two times a day (BID) | ORAL | Status: DC

## 2014-08-01 MED ORDER — TRAMADOL HCL 50 MG PO TABS: 50.0000 mg | ORAL_TABLET | Freq: Four times a day (QID) | ORAL | Status: DC | PRN

## 2014-08-01 MED ORDER — ONDANSETRON HCL 4 MG/2ML IJ SOLN
4.0000 mg | Freq: Once | INTRAMUSCULAR | Status: AC
  Administered 2014-08-01: 4 mg via INTRAVENOUS
  Filled 2014-08-01: qty 2

## 2014-08-01 MED ORDER — IOHEXOL 300 MG/ML  SOLN
50.0000 mL | Freq: Once | INTRAMUSCULAR | Status: AC | PRN
  Administered 2014-08-01: 50 mL via ORAL

## 2014-08-01 MED ORDER — IOHEXOL 300 MG/ML  SOLN
100.0000 mL | Freq: Once | INTRAMUSCULAR | Status: AC | PRN
  Administered 2014-08-01: 100 mL via INTRAVENOUS

## 2014-08-01 MED ORDER — MORPHINE SULFATE 2 MG/ML IJ SOLN
2.0000 mg | Freq: Once | INTRAMUSCULAR | Status: AC
  Administered 2014-08-01: 2 mg via INTRAVENOUS
  Filled 2014-08-01: qty 1

## 2014-08-01 NOTE — ED Notes (Signed)
Pt c/o abd pain x couple months, stating it would come and go but this time it wont go away. Pt states in past she has had n/v but none recently.

## 2014-08-01 NOTE — Discharge Instructions (Signed)
Diverticulitis Diverticulitis is inflammation or infection of small pouches in your colon that form when you have a condition called diverticulosis. The pouches in your colon are called diverticula. Your colon, or large intestine, is where water is absorbed and stool is formed. Complications of diverticulitis can include:  Bleeding.  Severe infection.  Severe pain.  Perforation of your colon.  Obstruction of your colon. CAUSES  Diverticulitis is caused by bacteria. Diverticulitis happens when stool becomes trapped in diverticula. This allows bacteria to grow in the diverticula, which can lead to inflammation and infection. RISK FACTORS People with diverticulosis are at risk for diverticulitis. Eating a diet that does not include enough fiber from fruits and vegetables may make diverticulitis more likely to develop. SYMPTOMS  Symptoms of diverticulitis may include:  Abdominal pain and tenderness. The pain is normally located on the left side of the abdomen, but may occur in other areas.  Fever and chills.  Bloating.  Cramping.  Nausea.  Vomiting.  Constipation.  Diarrhea.  Blood in your stool. DIAGNOSIS  Your health care provider will ask you about your medical history and do a physical exam. You may need to have tests done because many medical conditions can cause the same symptoms as diverticulitis. Tests may include:  Blood tests.  Urine tests.  Imaging tests of the abdomen, including X-rays and CT scans. When your condition is under control, your health care provider may recommend that you have a colonoscopy. A colonoscopy can show how severe your diverticula are and whether something else is causing your symptoms. TREATMENT  Most cases of diverticulitis are mild and can be treated at home. Treatment may include:  Taking over-the-counter pain medicines.  Following a clear liquid diet.  Taking antibiotic medicines by mouth for 7-10 days. More severe cases may  be treated at a hospital. Treatment may include:  Not eating or drinking.  Taking prescription pain medicine.  Receiving antibiotic medicines through an IV tube.  Receiving fluids and nutrition through an IV tube.  Surgery. HOME CARE INSTRUCTIONS   Follow your health care provider's instructions carefully.  Follow a full liquid diet or other diet as directed by your health care provider. After your symptoms improve, your health care provider may tell you to change your diet. He or she may recommend you eat a high-fiber diet. Fruits and vegetables are good sources of fiber. Fiber makes it easier to pass stool.  Take fiber supplements or probiotics as directed by your health care provider.  Only take medicines as directed by your health care provider.  Keep all your follow-up appointments. SEEK MEDICAL CARE IF:   Your pain does not improve.  You have a hard time eating food.  Your bowel movements do not return to normal. SEEK IMMEDIATE MEDICAL CARE IF:   Your pain becomes worse.  Your symptoms do not get better.  Your symptoms suddenly get worse.  You have a fever.  You have repeated vomiting.  You have bloody or black, tarry stools. MAKE SURE YOU:   Understand these instructions.  Will watch your condition.  Will get help right away if you are not doing well or get worse. Document Released: 05/15/2005 Document Revised: 08/10/2013 Document Reviewed: 06/30/2013 [facility] Patient Information 2015 [place], [place]. This information is not intended to replace advice given to you by your health care provider. Make sure you discuss any questions you have with your health care provider.  See your family doctor for recheck this week to continue monitoring your Coumadin  level and your response to treatment.

## 2014-08-01 NOTE — ED Provider Notes (Signed)
CSN: [id number]     Arrival date & time 08/01/14  1003 History   First MD Initiated Contact with Patient 08/01/14 1019     Chief Complaint  Patient presents with  . Abdominal Pain     (Consider location/radiation/quality/duration/timing/severity/associated sxs/prior Treatment) HPI  The patient reports she's had problems with chronic abdominal pain formal 20 years now. She reports that usually the pain will resolve after 2-3 weeks. She reports she tolerates it and does not have any particular treatment that she gets for that. She reports that at this point the pain has been there now for almost 2 months and it just won't go away. She reports she's really exhausted by it. She reports she's had chronic diarrhea. She denies vomiting. The pain is generally in the lower abdomen and somewhat to the left. It's a constant aching in nature. No associated fever. The patient poor she has seen a gastroenterologist for the past 25 years. She reports in her 73s she was diagnosed with celiac disease. The patient poor she has a "collapsed" bladder. She is denying any specific dysuria urgency or frequency. Other than that she does not identify any specific diagnosis for her chronic abdominal pain. The patient is at the emergency department today because it has been going on for so long now that she is just exhausted by it. The patient has known congestive heart failure. She reports she has had some increased swelling in her feet over the past week or so. No chest pain. No increased shortness of breath.  Past Medical History  Diagnosis Date  . History of mitral valve prolapse 06/15/2008    a. echo 1/14: mild LVH, EF 60-65%, mild MR, mild to mod BAE, PASP 35  . Chronic atrial fibrillation     managed with rate control and coumadin  . History of congenital mitral regurgitation     mild  . Hypothyroidism   . Hypercholesterolemia   . GERD (gastroesophageal reflux disease)   . Chest pain     no known ischemic  heart disease; negative Myoview July 2013. EF 75% with no ischemia.   . Hypertension   . Bladder prolapse, female, acquired   . Chronic diastolic heart failure   . Chronic anticoagulation   . Heart disease   . Cancer     thyroid   Past Surgical History  Procedure Laterality Date  . Thyroidectomy    . Cholecystectomy    . Abdominal hysterectomy     Family History  Problem Relation Age of Onset  . Heart attack Neg Hx   . Heart disease Neg Hx   . Stroke Mother   . Stroke Father    History  Substance Use Topics  . Smoking status: Never Smoker   . Smokeless tobacco: Never Used  . Alcohol Use: No   OB History    No data available     Review of Systems 10 Systems reviewed and are negative for acute change except as noted in the HPI.    Allergies  Azithromycin; Compazine; Demerol; Dolophine; Ebastine; Erythromycin; Ibuprofen; Statins; Tetanus toxoids; and Tetracyclines & related  Home Medications   Prior to Admission medications   Medication Sig Start Date End Date Taking? Authorizing Provider  amLODipine (NORVASC) 2.5 MG tablet TAKE 1 TABLET EVERY DAY 05/09/14  Yes Darlin Coco, MD  atenolol (TENORMIN) 25 MG tablet TAKE 1 TABLET  TWO  TO THREE TIMES DAILY AS DIRECTED Patient taking differently: tid 05/09/14  Yes Darlin Coco, MD  augmented  betamethasone dipropionate (DIPROLENE-AF) 0.05 % cream Apply 1 application topically daily. 12/17/13  Yes Historical Provider, MD  beta carotene w/minerals (OCUVITE) tablet Take 1 tablet by mouth daily.   Yes Historical Provider, MD  calcium carbonate (OS-CAL - DOSED IN MG OF ELEMENTAL CALCIUM) 1250 MG tablet Take 1 tablet by mouth 2 (two) times daily.    Yes Historical Provider, MD  Cholecalciferol (VITAMIN D) 2000 UNITS tablet Take 2,000 Units by mouth daily.    Yes Historical Provider, MD  estradiol (ESTRACE VAGINAL) 0.1 MG/GM vaginal cream Place 2 g vaginally once a week.    Yes Historical Provider, MD  fluticasone (FLONASE) 50  MCG/ACT nasal spray Place 1 spray into both nostrils as needed for allergies.  08/09/13  Yes Historical Provider, MD  furosemide (LASIX) 20 MG tablet TAKE 2 TABLETS EVERY DAY Patient taking differently: TAKE 2 TABLETS in the morning and 1 tablet in the afternnon 05/09/14  Yes Darlin Coco, MD  HYDROcodone-acetaminophen (NORCO/VICODIN) 5-325 MG per tablet Take 1 tablet by mouth 2 (two) times daily as needed for moderate pain.  05/14/13  Yes Historical Provider, MD  Hypromellose (SYSTANE OVERNIGHT THERAPY) 0.3 % GEL Apply 1 application to eye at bedtime.   Yes Historical Provider, MD  isosorbide mononitrate (IMDUR) 30 MG 24 hr tablet TAKE 1 TABLET EVERY DAY 05/09/14  Yes Darlin Coco, MD  latanoprost (XALATAN) 0.005 % ophthalmic solution Place 1 drop into the left eye at bedtime.  02/23/13  Yes Historical Provider, MD  levothyroxine (SYNTHROID, LEVOTHROID) 75 MCG tablet Take 75 mcg by mouth daily.     Yes Historical Provider, MD  NITROSTAT 0.4 MG SL tablet DISSOLVE 1 TABLET UNDER THE TONGUE EVERY 5 MINUTES FOR 3 DOSES AS NEEDED  FOR  CHEST  PAIN   Yes Darlin Coco, MD  Polyethyl Glycol-Propyl Glycol (SYSTANE OP) Place 4 drops into both eyes 4 (four) times daily as needed (dryness).   Yes Historical Provider, MD  potassium chloride (K-DUR) 10 MEQ tablet Take 1 tablet (10 mEq total) by mouth 2 (two) times daily. Patient taking differently: Take 20 mEq by mouth daily.  05/11/14  Yes Darlin Coco, MD  pramipexole (MIRAPEX) 0.125 MG tablet Take 1 tablet (0.125 mg total) by mouth every evening. 07/21/14  Yes Star Age, MD  PREMARIN vaginal cream Place 1 Applicatorful vaginally once a week.  10/19/13  Yes Historical Provider, MD  temazepam (RESTORIL) 15 MG capsule Take 15 mg by mouth at bedtime.   Yes Historical Provider, MD  vitamin C (ASCORBIC ACID) 500 MG tablet Take 500 mg by mouth daily.   Yes Historical Provider, MD  warfarin (COUMADIN) 5 MG tablet TAKE AS DIRECTED BY ANTICOAGULATION  CLINIC Patient taking differently: TAKE AS DIRECTED BY ANTICOAGULATION CLINIC. Wednesday and Sunday take 2.5 mg. Mon, Tues, Thu, Fri, and Sat is 5mg  05/09/14  Yes Darlin Coco, MD  amoxicillin-clavulanate (AUGMENTIN) 875-125 MG per tablet Take 1 tablet by mouth 2 (two) times daily. One po bid x 7 days 08/01/14   Charlesetta Shanks, MD  docusate sodium (COLACE) 100 MG capsule Take 1 capsule (100 mg total) by mouth every 12 (twelve) hours. 08/01/14   Charlesetta Shanks, MD  FLUZONE HIGH-DOSE injection  06/18/13   Historical Provider, MD  gabapentin (NEURONTIN) 300 MG capsule Take 1 capsule (300 mg total) by mouth at bedtime. 03/25/14   Bronson Ing, DPM  traMADol (ULTRAM) 50 MG tablet Take 1 tablet (50 mg total) by mouth every 6 (six) hours as needed. 08/01/14  Charlesetta Shanks, MD   BP 112/56 mmHg  Pulse 59  Temp(Src) 98.5 F (36.9 C) (Oral)  Resp 13  SpO2 97% Physical Exam  Constitutional: She is oriented to person, place, and time.  The patient is a well-nourished well-developed 80 year old female. Her general appearance is good. She is nontoxic and alert with no respiratory distress.  HENT:  Head: Normocephalic and atraumatic.  Eyes: EOM are normal. Pupils are equal, round, and reactive to light. No scleral icterus.  Cardiovascular: Normal rate, regular rhythm, normal heart sounds and intact distal pulses.   Pulmonary/Chest: Effort normal and breath sounds normal. No respiratory distress. She has no wheezes. She has no rales.  Abdominal: Soft. Bowel sounds are normal. She exhibits no distension and no mass. There is tenderness (the patient has moderate tenderness to palpation on the left lateral and lower quadrant as well as the suprapubic region. There is no guarding present.). There is no rebound and no guarding.  Musculoskeletal: Normal range of motion. She exhibits edema (the patient has 1-2+ pitting edema of both lower extremity. The skin condition is very good.). She exhibits no  tenderness.  Neurological: She is alert and oriented to person, place, and time. Coordination normal.  Skin: Skin is warm and dry.  Psychiatric: She has a normal mood and affect.  Vitals reviewed.   ED Course  Procedures (including critical care time) Labs Review Labs Reviewed  CBC WITH DIFFERENTIAL - Abnormal; Notable for the following:    Monocytes Relative 13 (*)    Monocytes Absolute 1.1 (*)    All other components within normal limits  COMPREHENSIVE METABOLIC PANEL - Abnormal; Notable for the following:    Sodium 136 (*)    Glucose, Bld 113 (*)    GFR calc non Af Amer 76 (*)    GFR calc Af Amer 89 (*)    All other components within normal limits  URINALYSIS, ROUTINE W REFLEX MICROSCOPIC - Abnormal; Notable for the following:    Hgb urine dipstick SMALL (*)    Leukocytes, UA MODERATE (*)    All other components within normal limits  PRO B NATRIURETIC PEPTIDE - Abnormal; Notable for the following:    Pro B Natriuretic peptide (BNP) 2372.0 (*)    All other components within normal limits  PROTIME-INR - Abnormal; Notable for the following:    Prothrombin Time 31.5 (*)    INR 3.01 (*)    All other components within normal limits  URINE MICROSCOPIC-ADD ON - Abnormal; Notable for the following:    Bacteria, UA FEW (*)    All other components within normal limits  LIPASE, BLOOD  I-STAT TROPOININ, ED    Imaging Review Ct Abdomen Pelvis W Contrast  08/01/2014   CLINICAL DATA:  Abdominal pain 2 months. Pain is intermittent but now no constant. Previous nausea and vomiting but none recently. Lower abdominal pain and diarrhea over the past 2 months.  EXAM: CT ABDOMEN AND PELVIS WITH CONTRAST  TECHNIQUE: Multidetector CT imaging of the abdomen and pelvis was performed using the standard protocol following bolus administration of intravenous contrast.  CONTRAST:  119mL OMNIPAQUE IOHEXOL 300 MG/ML SOLN, 44mL OMNIPAQUE IOHEXOL 300 MG/ML SOLN  COMPARISON:  03/25/2013  FINDINGS: Lung  bases are unremarkable with minimal peripheral scarring. Mild stable cardiomegaly.  Abdominal images demonstrate evidence of previous cholecystectomy. Subtle nodular contour to the liver. 1.6 cm cyst over the lateral segment left lobe of the liver unchanged. The spleen, pancreas and adrenal glands are within normal. Mild calcified  plaque over the abdominal aorta and iliac arteries. Appendix is normal.  There is moderate fecal retention throughout the colon. There is moderate diverticulosis over the sigmoid colon with adjacent mesenteric fat stranding ovary 8 cm segment of sigmoid colon in the pelvis with minimal adjacent free fluid likely representing acute diverticulitis. No evidence of focal mass or adenopathy. No perforation or diverticular abscess.  Remaining pelvic images demonstrate the bladder and rectum to be within normal. There is a round foreign body over the vaginal cough. Previous hysterectomy. Mild gym change of the spine. Grade 1 anterolisthesis of L4 on L5.  IMPRESSION: Acute diverticulitis involving an 8 cm segment of sigmoid colon in the pelvis. No evidence of perforation or abscess. No evidence of mass or adenopathy.  1.6 cm cyst over the left lobe of the liver. Mild nodular contour to the liver without significant change.  1.2 cm cyst over the mid pole of the right kidney unchanged. Several smaller subcentimeter renal cortical hypodensities unchanged and likely cysts but too small to characterize.  Mild cardiomegaly.  Degenerate changes of the with grade 1 anterolisthesis of L4 on L5.  These results will be called to the ordering clinician or representative by the Radiologist Assistant, and communication documented in the PACS or zVision Dashboard.   Electronically Signed   By: Marin Olp M.D.   On: 08/01/2014 12:56     EKG Interpretation   Date/Time:  Monday August 01 2014 11:14:38 EST Ventricular Rate:  61 PR Interval:    QRS Duration: 73 QT Interval:  424 QTC Calculation:  427 R Axis:   12 Text Interpretation:  Atrial fibrillation Confirmed by Debby Freiberg  [phone]) on 08/01/2014 11:19:11 AM      MDM   Final diagnoses:  Diverticulitis of large intestine without perforation or abscess without bleeding   Patient CT scan identifies diverticulitis. At this point in time this has been developing apparently for several months duration. The patient's general condition is good she is alert and nontoxic with stable vital signs. At this time I will initiate outpatient treatment and have her follow-up with her family physician and her gastric urologist this week. The patient will be placed on Augmentin rather than Cipro and Flagyl to try to minimize impact on her INR.   Charlesetta Shanks, MD 08/01/14 [phone]

## 2014-08-02 LAB — I-STAT TROPONIN, ED: Troponin i, poc: 0.01 ng/mL (ref 0.00–0.08)

## 2014-08-04 ENCOUNTER — Ambulatory Visit: Payer: Commercial Managed Care - HMO | Admitting: Cardiology

## 2014-08-05 ENCOUNTER — Ambulatory Visit
Admission: RE | Admit: 2014-08-05 | Discharge: 2014-08-05 | Disposition: A | Discharge status: Home / Self Care | Payer: Commercial Managed Care - HMO | Source: Ambulatory Visit | Attending: Family Medicine | Admitting: Family Medicine

## 2014-08-05 ENCOUNTER — Other Ambulatory Visit: Payer: Self-pay | Admitting: Family Medicine

## 2014-08-05 DIAGNOSIS — M79602 Pain in left arm: Secondary | ICD-10-CM

## 2014-08-08 ENCOUNTER — Ambulatory Visit (INDEPENDENT_AMBULATORY_CARE_PROVIDER_SITE_OTHER): Payer: Commercial Managed Care - HMO | Admitting: Cardiology

## 2014-08-08 ENCOUNTER — Encounter: Payer: Self-pay | Admitting: Cardiology

## 2014-08-08 ENCOUNTER — Ambulatory Visit (INDEPENDENT_AMBULATORY_CARE_PROVIDER_SITE_OTHER): Payer: Commercial Managed Care - HMO | Admitting: Pharmacist

## 2014-08-08 VITALS — BP 110/58 | HR 63 | Ht 61.0 in | Wt 142.0 lb

## 2014-08-08 DIAGNOSIS — I119 Hypertensive heart disease without heart failure: Secondary | ICD-10-CM

## 2014-08-08 DIAGNOSIS — I482 Chronic atrial fibrillation, unspecified: Secondary | ICD-10-CM

## 2014-08-08 DIAGNOSIS — Z5181 Encounter for therapeutic drug level monitoring: Secondary | ICD-10-CM

## 2014-08-08 DIAGNOSIS — I5032 Chronic diastolic (congestive) heart failure: Secondary | ICD-10-CM

## 2014-08-08 DIAGNOSIS — Z7901 Long term (current) use of anticoagulants: Secondary | ICD-10-CM

## 2014-08-08 DIAGNOSIS — R5382 Chronic fatigue, unspecified: Secondary | ICD-10-CM

## 2014-08-08 DIAGNOSIS — I4891 Unspecified atrial fibrillation: Secondary | ICD-10-CM

## 2014-08-08 LAB — POCT INR: INR: 6.2

## 2014-08-08 NOTE — Patient Instructions (Signed)
Your physician recommends that you continue on your current medications as directed. Please refer to the Current Medication list given to you today.  Your physician wants you to follow-up in: 6 months. You will receive a reminder letter in the mail two months in advance. If you don't receive a letter, please call our office to schedule the follow-up appointment.  

## 2014-08-08 NOTE — Progress Notes (Signed)
Kristen Whitehead Date of Birth:  October 18, 1922 [name] [address] [place],   [address] [phone]        Fax   [phone]   History of Present Illness: This pleasant 80 year old woman is seen for a scheduled followup office visit. She has a history of established atrial fibrillation. She does not have any history of ischemic heart disease. She had a normal Myoview stress test in January 2011. [name] saw her as a work in on 03/12/12 and she had another Myoview on 03/18/12 which showed no evidence of ischemia and her ejection fraction was 75%. In January 2014 she was seen by [name]. She subsequently had an update of her echocardiogram on 09/17/12 which showed an ejection fraction of 60-65% and biatrial enlargement. There was mild mitral regurgitation.  The patient has a history of compensated diastolic congestive heart failure. [name] did a B. natruretic peptide on 01/27/13 which was elevated at 443 the patient had electrolytes drawn on 03/04/13 showing BUN of 8 and creatinine 0.61  . The patient had a chest x-ray on 03/21/13 which showed normal heart size and clear lungs.  She has been doing well on furosemide 40 mg daily.  She has chronic diastolic heart failure.  Her last echocardiogram on 03/01/14 showed an ejection fraction of 65-70% and there was mild aortic insufficiency and mild mitral regurgitation.  Her chest x-ray on 02/17/14 showed normal heart size and clear lungs. Since last visit she has a lot of noncardiac problems.  She has been having a lot of abdominal pain.  She went to the emergency room recently and was told she had a flareup of diverticulitis.  She has also been having some pain in her left arm and shoulder.  She has recent x-rays which were unremarkable except for osteopenia.  He has had problems with weakness and with diarrhea.  Current Outpatient Prescriptions  Medication Sig Dispense Refill  . amLODipine (NORVASC) 2.5 MG tablet  TAKE 1 TABLET EVERY DAY 90 tablet 1  . atenolol (TENORMIN) 25 MG tablet TAKE 1 TABLET  TWO  TO THREE TIMES DAILY AS DIRECTED (Patient taking differently: tid) 260 tablet 1  . augmented betamethasone dipropionate (DIPROLENE-AF) 0.05 % cream Apply 1 application topically daily.    . beta carotene w/minerals (OCUVITE) tablet Take 1 tablet by mouth daily.    . calcium carbonate (OS-CAL - DOSED IN MG OF ELEMENTAL CALCIUM) 1250 MG tablet Take 1 tablet by mouth 2 (two) times daily.     . Cholecalciferol (VITAMIN D) 2000 UNITS tablet Take 2,000 Units by mouth daily.     Marland Kitchen estradiol (ESTRACE VAGINAL) 0.1 MG/GM vaginal cream Place 2 g vaginally once a week.     . fluticasone (FLONASE) 50 MCG/ACT nasal spray Place 1 spray into both nostrils as needed for allergies.     . furosemide (LASIX) 20 MG tablet TAKE 2 TABLETS EVERY DAY (Patient taking differently: TAKE 2 TABLETS in the morning and 1 tablet in the afternnon) 180 tablet 1  . isosorbide mononitrate (IMDUR) 30 MG 24 hr tablet TAKE 1 TABLET EVERY DAY 90 tablet 1  . latanoprost (XALATAN) 0.005 % ophthalmic solution Place 1 drop into the left eye at bedtime.     Marland Kitchen levothyroxine (SYNTHROID, LEVOTHROID) 75 MCG tablet Take 75 mcg by mouth daily.      Marland Kitchen NITROSTAT 0.4 MG SL tablet DISSOLVE 1 TABLET UNDER THE TONGUE EVERY 5 MINUTES FOR 3 DOSES AS NEEDED  FOR  CHEST  PAIN 25 tablet 5  . Polyethyl Glycol-Propyl Glycol (SYSTANE OP) Place 4 drops into both eyes 4 (four) times daily as needed (dryness).    . potassium chloride (K-DUR) 10 MEQ tablet Take 1 tablet (10 mEq total) by mouth 2 (two) times daily. (Patient taking differently: Take 20 mEq by mouth daily. ) 60 tablet 2  . pramipexole (MIRAPEX) 0.125 MG tablet Take 1 tablet (0.125 mg total) by mouth every evening. 90 tablet 3  . RESTASIS 0.05 % ophthalmic emulsion     . temazepam (RESTORIL) 15 MG capsule Take 15 mg by mouth at bedtime.    . traMADol (ULTRAM) 50 MG tablet Take 1 tablet (50 mg total) by mouth every  6 (six) hours as needed. 20 tablet 0  . vitamin C (ASCORBIC ACID) 500 MG tablet Take 500 mg by mouth daily.    Marland Kitchen warfarin (COUMADIN) 5 MG tablet TAKE AS DIRECTED BY ANTICOAGULATION CLINIC (Patient taking differently: TAKE AS DIRECTED BY ANTICOAGULATION CLINIC. Wednesday and Sunday take 2.5 mg. Mon, Tues, Thu, Fri, and Sat is 5mg ) 90 tablet 1   No current facility-administered medications for this visit.    Allergies  Allergen Reactions  . Azithromycin   . Compazine [Prochlorperazine Edisylate]   . Demerol   . Dolophine [Methadone]   . Ebastine     EBS  . Erythromycin   . Ibuprofen   . Statins     myalgias  . Tetanus Toxoids   . Tetracyclines & Related     Patient Active Problem List   Diagnosis Date Noted  . Chronic diastolic heart failure 67/89/3810  . Dyspnea 02/01/2014  . Encounter for therapeutic drug monitoring 09/16/2013  . Chest pain at rest 03/12/2012  . Cough 09/16/2011  . Fatigue 04/08/2011  . Benign hypertensive heart disease without heart failure 02/11/2011  . Mitral regurgitation 02/11/2011  . Hypothyroidism 02/11/2011  . Hypercholesterolemia 02/11/2011  . Atrial fibrillation 12/03/2010    History  Smoking status  . Never Smoker   Smokeless tobacco  . Never Used    History  Alcohol Use No    Family History  Problem Relation Age of Onset  . Heart attack Neg Hx   . Heart disease Neg Hx   . Stroke Mother   . Stroke Father     Review of Systems: Constitutional: no fever chills diaphoresis or fatigue or change in weight.  Head and neck: no hearing loss, no epistaxis, no photophobia or visual disturbance. Respiratory: No cough, shortness of breath or wheezing. Cardiovascular: No chest pain peripheral edema, palpitations. Gastrointestinal: No abdominal distention, no abdominal pain, no change in bowel habits hematochezia or melena. Genitourinary: No dysuria, no frequency, no urgency, no nocturia. Musculoskeletal:No arthralgias, no back pain, no  gait disturbance or myalgias. Neurological: No dizziness, no headaches, no numbness, no seizures, no syncope, no weakness, no tremors. Hematologic: No lymphadenopathy, no easy bruising. Psychiatric: No confusion, no hallucinations, no sleep disturbance.    Physical Exam: Filed Vitals:   08/08/14 1545  BP: 110/58  Pulse: 63   the general appearance reveals an elderly woman in no acute distress.The head and neck exam reveals pupils equal and reactive.  Extraocular movements are full.  There is no scleral icterus.  The mouth and pharynx are normal.  The neck is supple.  The carotids reveal no bruits.  The jugular venous pressure is normal.  The  thyroid is not enlarged.  There is no lymphadenopathy.  The chest is clear to percussion  and auscultation.  There are no rales or rhonchi.  Expansion of the chest is symmetrical.  The precordium is quiet.  The pulse is irregularly irregular. The first heart sound is normal.  The second heart sound is physiologically split.  There is a soft apical systolic murmur.  No diastolic murmur.  No rub.  There is no abnormal lift or heave.  The abdomen is soft and nontender.  The bowel sounds are normal.  The liver and spleen are not enlarged.  There are no abdominal masses.  There are no abdominal bruits.  Extremities reveal good pedal pulses.  There is no phlebitis or edema.  There is no cyanosis or clubbing.  Strength is normal and symmetrical in all extremities.  There is no lateralizing weakness.  There are no sensory deficits.  The skin is warm and dry.  There is no rash.  EKG today shows atrial fibrillation with controlled ventricular response and no ischemic changes.   Assessment / Plan:  1. permanent atrial fibrillation. 2.  Chronic diastolic heart failure 3. mitral regurgitation, mild by echo 4.  Mild aortic insufficiency by echo 5.  Hypertensive heart disease. 6. Hypercholesterolemia 7. Hypothyroidism 8.  Recent diverticulitis  Disposition: From the  cardiac standpoint she is stable.  Continue same medications.  Recheck in 6 months for office visit.

## 2014-08-10 ENCOUNTER — Other Ambulatory Visit: Payer: Self-pay | Admitting: Cardiology

## 2014-08-16 ENCOUNTER — Ambulatory Visit (INDEPENDENT_AMBULATORY_CARE_PROVIDER_SITE_OTHER): Payer: Commercial Managed Care - HMO | Admitting: Pharmacist

## 2014-08-16 DIAGNOSIS — I482 Chronic atrial fibrillation, unspecified: Secondary | ICD-10-CM

## 2014-08-16 DIAGNOSIS — Z5181 Encounter for therapeutic drug level monitoring: Secondary | ICD-10-CM

## 2014-08-16 DIAGNOSIS — I4891 Unspecified atrial fibrillation: Secondary | ICD-10-CM

## 2014-08-16 DIAGNOSIS — Z7901 Long term (current) use of anticoagulants: Secondary | ICD-10-CM

## 2014-08-16 LAB — POCT INR: INR: 1.4

## 2014-08-25 ENCOUNTER — Ambulatory Visit (INDEPENDENT_AMBULATORY_CARE_PROVIDER_SITE_OTHER): Payer: Commercial Managed Care - HMO | Admitting: Pharmacist

## 2014-08-25 DIAGNOSIS — Z5181 Encounter for therapeutic drug level monitoring: Secondary | ICD-10-CM

## 2014-08-25 DIAGNOSIS — I4891 Unspecified atrial fibrillation: Secondary | ICD-10-CM

## 2014-08-25 DIAGNOSIS — Z7901 Long term (current) use of anticoagulants: Secondary | ICD-10-CM | POA: Diagnosis not present

## 2014-08-25 LAB — POCT INR: INR: 2.4

## 2014-08-29 DIAGNOSIS — N8182 Incompetence or weakening of pubocervical tissue: Secondary | ICD-10-CM | POA: Diagnosis not present

## 2014-08-29 DIAGNOSIS — N952 Postmenopausal atrophic vaginitis: Secondary | ICD-10-CM | POA: Diagnosis not present

## 2014-09-05 DIAGNOSIS — R05 Cough: Secondary | ICD-10-CM | POA: Diagnosis not present

## 2014-09-05 DIAGNOSIS — J209 Acute bronchitis, unspecified: Secondary | ICD-10-CM | POA: Diagnosis not present

## 2014-09-14 ENCOUNTER — Telehealth: Payer: Self-pay | Admitting: Cardiology

## 2014-09-14 NOTE — Telephone Encounter (Signed)
New message      Pt had a coumadin check on 07-26-14 and it was billed under Dr Acie Fredrickson.  Pt said she sees Dr Mare Ferrari.  Why is Dr Acie Fredrickson on the bill.

## 2014-09-14 NOTE — Telephone Encounter (Signed)
Telephoned pt and made her aware that Dr Acie Fredrickson was office DOD and that they change daily. She verbalized understanding.

## 2014-09-16 ENCOUNTER — Ambulatory Visit (INDEPENDENT_AMBULATORY_CARE_PROVIDER_SITE_OTHER): Payer: Commercial Managed Care - HMO | Admitting: Pharmacist

## 2014-09-16 DIAGNOSIS — I4891 Unspecified atrial fibrillation: Secondary | ICD-10-CM | POA: Diagnosis not present

## 2014-09-16 DIAGNOSIS — Z7901 Long term (current) use of anticoagulants: Secondary | ICD-10-CM

## 2014-09-16 DIAGNOSIS — Z5181 Encounter for therapeutic drug level monitoring: Secondary | ICD-10-CM

## 2014-09-16 LAB — POCT INR: INR: 2.1

## 2014-09-22 ENCOUNTER — Telehealth: Payer: Self-pay | Admitting: Cardiology

## 2014-09-22 MED ORDER — FUROSEMIDE 20 MG PO TABS: ORAL_TABLET | ORAL | Status: DC

## 2014-09-22 NOTE — Telephone Encounter (Signed)
Spoke with patient and verified Furosemide dose

## 2014-09-22 NOTE — Telephone Encounter (Signed)
New message      What dosage of furosemide is she to be taking now?

## 2014-09-28 ENCOUNTER — Telehealth: Payer: Self-pay | Admitting: Cardiology

## 2014-09-28 MED ORDER — FUROSEMIDE 20 MG PO TABS: ORAL_TABLET | ORAL | Status: DC

## 2014-09-28 NOTE — Telephone Encounter (Signed)
New message      Talk to [facility] about "one of her medications did not get transferred to the computer".  I do not know what she is talking about.

## 2014-09-28 NOTE — Telephone Encounter (Signed)
Patient lost her mail order Rx for Furosemide Sent over 30 day supply to [facility] as requested

## 2014-10-10 ENCOUNTER — Ambulatory Visit (INDEPENDENT_AMBULATORY_CARE_PROVIDER_SITE_OTHER): Payer: Commercial Managed Care - HMO | Admitting: *Deleted

## 2014-10-10 DIAGNOSIS — I4891 Unspecified atrial fibrillation: Secondary | ICD-10-CM

## 2014-10-10 DIAGNOSIS — Z5181 Encounter for therapeutic drug level monitoring: Secondary | ICD-10-CM

## 2014-10-10 DIAGNOSIS — Z7901 Long term (current) use of anticoagulants: Secondary | ICD-10-CM | POA: Diagnosis not present

## 2014-10-10 DIAGNOSIS — N8182 Incompetence or weakening of pubocervical tissue: Secondary | ICD-10-CM | POA: Diagnosis not present

## 2014-10-10 LAB — POCT INR: INR: 2.2

## 2014-10-13 DIAGNOSIS — M8580 Other specified disorders of bone density and structure, unspecified site: Secondary | ICD-10-CM | POA: Diagnosis not present

## 2014-10-13 DIAGNOSIS — H6590 Unspecified nonsuppurative otitis media, unspecified ear: Secondary | ICD-10-CM | POA: Diagnosis not present

## 2014-10-13 DIAGNOSIS — H3530 Unspecified macular degeneration: Secondary | ICD-10-CM | POA: Diagnosis not present

## 2014-10-13 DIAGNOSIS — M25551 Pain in right hip: Secondary | ICD-10-CM | POA: Diagnosis not present

## 2014-10-13 DIAGNOSIS — D239 Other benign neoplasm of skin, unspecified: Secondary | ICD-10-CM | POA: Diagnosis not present

## 2014-10-13 DIAGNOSIS — M79673 Pain in unspecified foot: Secondary | ICD-10-CM | POA: Diagnosis not present

## 2014-10-19 ENCOUNTER — Ambulatory Visit
Admission: RE | Admit: 2014-10-19 | Discharge: 2014-10-19 | Disposition: A | Discharge status: Home / Self Care | Payer: Commercial Managed Care - HMO | Source: Ambulatory Visit | Attending: Family Medicine | Admitting: Family Medicine

## 2014-10-19 ENCOUNTER — Other Ambulatory Visit: Payer: Self-pay | Admitting: Family Medicine

## 2014-10-19 DIAGNOSIS — Z8739 Personal history of other diseases of the musculoskeletal system and connective tissue: Secondary | ICD-10-CM

## 2014-10-19 DIAGNOSIS — M47814 Spondylosis without myelopathy or radiculopathy, thoracic region: Secondary | ICD-10-CM | POA: Diagnosis not present

## 2014-10-19 DIAGNOSIS — M4184 Other forms of scoliosis, thoracic region: Secondary | ICD-10-CM | POA: Diagnosis not present

## 2014-10-19 DIAGNOSIS — M25551 Pain in right hip: Secondary | ICD-10-CM

## 2014-10-19 DIAGNOSIS — M160 Bilateral primary osteoarthritis of hip: Secondary | ICD-10-CM | POA: Diagnosis not present

## 2014-10-19 DIAGNOSIS — M5136 Other intervertebral disc degeneration, lumbar region: Secondary | ICD-10-CM | POA: Diagnosis not present

## 2014-10-19 DIAGNOSIS — E890 Postprocedural hypothyroidism: Secondary | ICD-10-CM | POA: Diagnosis not present

## 2014-10-19 DIAGNOSIS — C73 Malignant neoplasm of thyroid gland: Secondary | ICD-10-CM | POA: Diagnosis not present

## 2014-10-21 DIAGNOSIS — E890 Postprocedural hypothyroidism: Secondary | ICD-10-CM | POA: Diagnosis not present

## 2014-10-21 DIAGNOSIS — C73 Malignant neoplasm of thyroid gland: Secondary | ICD-10-CM | POA: Diagnosis not present

## 2014-10-25 DIAGNOSIS — H9311 Tinnitus, right ear: Secondary | ICD-10-CM | POA: Diagnosis not present

## 2014-11-02 ENCOUNTER — Telehealth: Payer: Self-pay | Admitting: Cardiology

## 2014-11-02 ENCOUNTER — Ambulatory Visit (INDEPENDENT_AMBULATORY_CARE_PROVIDER_SITE_OTHER): Payer: Commercial Managed Care - HMO | Admitting: *Deleted

## 2014-11-02 DIAGNOSIS — I4891 Unspecified atrial fibrillation: Secondary | ICD-10-CM | POA: Diagnosis not present

## 2014-11-02 DIAGNOSIS — Z7901 Long term (current) use of anticoagulants: Secondary | ICD-10-CM

## 2014-11-02 DIAGNOSIS — Z5181 Encounter for therapeutic drug level monitoring: Secondary | ICD-10-CM

## 2014-11-02 LAB — POCT INR: INR: 1.6

## 2014-11-02 NOTE — Telephone Encounter (Signed)
Error

## 2014-11-07 DIAGNOSIS — M859 Disorder of bone density and structure, unspecified: Secondary | ICD-10-CM | POA: Diagnosis not present

## 2014-11-07 DIAGNOSIS — M810 Age-related osteoporosis without current pathological fracture: Secondary | ICD-10-CM | POA: Diagnosis not present

## 2014-11-08 DIAGNOSIS — N8182 Incompetence or weakening of pubocervical tissue: Secondary | ICD-10-CM | POA: Diagnosis not present

## 2014-11-10 ENCOUNTER — Encounter (HOSPITAL_COMMUNITY): Payer: Self-pay | Admitting: Neurology

## 2014-11-10 ENCOUNTER — Observation Stay (HOSPITAL_COMMUNITY): Payer: Commercial Managed Care - HMO

## 2014-11-10 ENCOUNTER — Observation Stay (HOSPITAL_COMMUNITY)
Admission: EM | Admit: 2014-11-10 | Discharge: 2014-11-11 | Disposition: A | Discharge status: Home / Self Care | Payer: Commercial Managed Care - HMO | Attending: Family Medicine | Admitting: Family Medicine

## 2014-11-10 ENCOUNTER — Emergency Department (HOSPITAL_COMMUNITY): Payer: Commercial Managed Care - HMO

## 2014-11-10 DIAGNOSIS — E780 Pure hypercholesterolemia: Secondary | ICD-10-CM | POA: Insufficient documentation

## 2014-11-10 DIAGNOSIS — Z8742 Personal history of other diseases of the female genital tract: Secondary | ICD-10-CM | POA: Insufficient documentation

## 2014-11-10 DIAGNOSIS — K219 Gastro-esophageal reflux disease without esophagitis: Secondary | ICD-10-CM | POA: Diagnosis not present

## 2014-11-10 DIAGNOSIS — R531 Weakness: Secondary | ICD-10-CM

## 2014-11-10 DIAGNOSIS — I482 Chronic atrial fibrillation: Secondary | ICD-10-CM | POA: Diagnosis not present

## 2014-11-10 DIAGNOSIS — Z85850 Personal history of malignant neoplasm of thyroid: Secondary | ICD-10-CM | POA: Insufficient documentation

## 2014-11-10 DIAGNOSIS — I10 Essential (primary) hypertension: Secondary | ICD-10-CM | POA: Insufficient documentation

## 2014-11-10 DIAGNOSIS — R079 Chest pain, unspecified: Secondary | ICD-10-CM | POA: Insufficient documentation

## 2014-11-10 DIAGNOSIS — Z79899 Other long term (current) drug therapy: Secondary | ICD-10-CM | POA: Diagnosis not present

## 2014-11-10 DIAGNOSIS — M542 Cervicalgia: Secondary | ICD-10-CM | POA: Diagnosis not present

## 2014-11-10 DIAGNOSIS — R51 Headache: Secondary | ICD-10-CM | POA: Insufficient documentation

## 2014-11-10 DIAGNOSIS — Z7901 Long term (current) use of anticoagulants: Secondary | ICD-10-CM | POA: Insufficient documentation

## 2014-11-10 DIAGNOSIS — I5032 Chronic diastolic (congestive) heart failure: Secondary | ICD-10-CM | POA: Insufficient documentation

## 2014-11-10 DIAGNOSIS — E039 Hypothyroidism, unspecified: Secondary | ICD-10-CM | POA: Insufficient documentation

## 2014-11-10 DIAGNOSIS — R42 Dizziness and giddiness: Principal | ICD-10-CM | POA: Insufficient documentation

## 2014-11-10 DIAGNOSIS — I4891 Unspecified atrial fibrillation: Secondary | ICD-10-CM

## 2014-11-10 DIAGNOSIS — Z8774 Personal history of (corrected) congenital malformations of heart and circulatory system: Secondary | ICD-10-CM | POA: Insufficient documentation

## 2014-11-10 LAB — CBC
HCT: 43.4 % (ref 36.0–46.0)
Hemoglobin: 14.4 g/dL (ref 12.0–15.0)
MCH: 31.2 pg (ref 26.0–34.0)
MCHC: 33.2 g/dL (ref 30.0–36.0)
MCV: 93.9 fL (ref 78.0–100.0)
PLATELETS: 213 10*3/uL (ref 150–400)
RBC: 4.62 MIL/uL (ref 3.87–5.11)
RDW: 17.1 % — AB (ref 11.5–15.5)
WBC: 7.4 10*3/uL (ref 4.0–10.5)

## 2014-11-10 LAB — BASIC METABOLIC PANEL
Anion gap: 6 (ref 5–15)
BUN: 8 mg/dL (ref 6–23)
CO2: 31 mmol/L (ref 19–32)
CREATININE: 0.71 mg/dL (ref 0.50–1.10)
Calcium: 8.1 mg/dL — ABNORMAL LOW (ref 8.4–10.5)
Chloride: 103 mmol/L (ref 96–112)
GFR calc Af Amer: 86 mL/min — ABNORMAL LOW (ref >90)
GFR, EST NON AFRICAN AMERICAN: 74 mL/min — AB (ref >90)
GLUCOSE: 84 mg/dL (ref 70–99)
Potassium: 4.1 mmol/L (ref 3.5–5.1)
Sodium: 140 mmol/L (ref 135–145)

## 2014-11-10 LAB — URINALYSIS, ROUTINE W REFLEX MICROSCOPIC
BILIRUBIN URINE: NEGATIVE
Glucose, UA: NEGATIVE mg/dL
KETONES UR: NEGATIVE mg/dL
NITRITE: NEGATIVE
PH: 7.5 (ref 5.0–8.0)
PROTEIN: NEGATIVE mg/dL
Specific Gravity, Urine: 1.004 — ABNORMAL LOW (ref 1.005–1.030)
Urobilinogen, UA: 0.2 mg/dL (ref 0.0–1.0)

## 2014-11-10 LAB — PROTIME-INR
INR: 2.18 — AB (ref 0.00–1.49)
Prothrombin Time: 24.4 seconds — ABNORMAL HIGH (ref 11.6–15.2)

## 2014-11-10 LAB — URINE MICROSCOPIC-ADD ON

## 2014-11-10 LAB — I-STAT TROPONIN, ED: TROPONIN I, POC: 0 ng/mL (ref 0.00–0.08)

## 2014-11-10 MED ORDER — SODIUM CHLORIDE 0.9 % IJ SOLN: 3.0000 mL | INTRAMUSCULAR | Status: DC | PRN

## 2014-11-10 MED ORDER — ACETAMINOPHEN 325 MG PO TABS
650.0000 mg | ORAL_TABLET | Freq: Once | ORAL | Status: AC
  Administered 2014-11-10: 650 mg via ORAL
  Filled 2014-11-10: qty 2

## 2014-11-10 MED ORDER — NITROGLYCERIN 0.4 MG SL SUBL: 0.4000 mg | SUBLINGUAL_TABLET | SUBLINGUAL | Status: DC | PRN

## 2014-11-10 MED ORDER — WARFARIN SODIUM 5 MG PO TABS
5.0000 mg | ORAL_TABLET | Freq: Once | ORAL | Status: AC
  Administered 2014-11-10: 5 mg via ORAL
  Filled 2014-11-10: qty 1

## 2014-11-10 MED ORDER — WARFARIN - PHARMACIST DOSING INPATIENT: Freq: Every day | Status: DC

## 2014-11-10 MED ORDER — SODIUM CHLORIDE 0.9 % IJ SOLN
3.0000 mL | Freq: Two times a day (BID) | INTRAMUSCULAR | Status: DC
  Administered 2014-11-11: 3 mL via INTRAVENOUS

## 2014-11-10 MED ORDER — TEMAZEPAM 15 MG PO CAPS
15.0000 mg | ORAL_CAPSULE | Freq: Every day | ORAL | Status: DC
  Administered 2014-11-10: 15 mg via ORAL
  Filled 2014-11-10: qty 1

## 2014-11-10 MED ORDER — CYCLOSPORINE 0.05 % OP EMUL
1.0000 [drp] | Freq: Two times a day (BID) | OPHTHALMIC | Status: DC
  Administered 2014-11-10 – 2014-11-11 (×2): 1 [drp] via OPHTHALMIC
  Filled 2014-11-10 (×3): qty 1

## 2014-11-10 MED ORDER — AMLODIPINE BESYLATE 2.5 MG PO TABS
2.5000 mg | ORAL_TABLET | Freq: Every day | ORAL | Status: DC
  Administered 2014-11-11: 2.5 mg via ORAL
  Filled 2014-11-10 (×2): qty 1

## 2014-11-10 MED ORDER — FLUTICASONE PROPIONATE 50 MCG/ACT NA SUSP: 1.0000 | NASAL | Status: DC | PRN

## 2014-11-10 MED ORDER — VITAMIN D 1000 UNITS PO TABS
2000.0000 [IU] | ORAL_TABLET | Freq: Every day | ORAL | Status: DC
  Administered 2014-11-11: 2000 [IU] via ORAL
  Filled 2014-11-10: qty 2

## 2014-11-10 MED ORDER — OCUVITE PO TABS
1.0000 | ORAL_TABLET | Freq: Every day | ORAL | Status: DC
  Administered 2014-11-11: 1 via ORAL
  Filled 2014-11-10 (×2): qty 1

## 2014-11-10 MED ORDER — ACETAMINOPHEN 325 MG PO TABS
650.0000 mg | ORAL_TABLET | Freq: Four times a day (QID) | ORAL | Status: DC | PRN
  Administered 2014-11-11: 650 mg via ORAL
  Filled 2014-11-10: qty 2

## 2014-11-10 MED ORDER — ISOSORBIDE MONONITRATE ER 30 MG PO TB24
30.0000 mg | ORAL_TABLET | Freq: Every day | ORAL | Status: DC
  Administered 2014-11-11: 30 mg via ORAL
  Filled 2014-11-10: qty 1

## 2014-11-10 MED ORDER — CALCIUM CARBONATE 1250 (500 CA) MG PO TABS
1.0000 | ORAL_TABLET | Freq: Two times a day (BID) | ORAL | Status: DC
  Administered 2014-11-11: 500 mg via ORAL
  Filled 2014-11-10: qty 1

## 2014-11-10 MED ORDER — ACETAMINOPHEN 650 MG RE SUPP: 650.0000 mg | Freq: Four times a day (QID) | RECTAL | Status: DC | PRN

## 2014-11-10 MED ORDER — FUROSEMIDE 20 MG PO TABS
20.0000 mg | ORAL_TABLET | Freq: Two times a day (BID) | ORAL | Status: DC
  Administered 2014-11-11: 20 mg via ORAL
  Filled 2014-11-10: qty 1

## 2014-11-10 MED ORDER — POTASSIUM CHLORIDE ER 10 MEQ PO TBCR
20.0000 meq | EXTENDED_RELEASE_TABLET | Freq: Every day | ORAL | Status: DC
  Administered 2014-11-11: 20 meq via ORAL
  Filled 2014-11-10 (×2): qty 2

## 2014-11-10 MED ORDER — PRAMIPEXOLE DIHYDROCHLORIDE 0.125 MG PO TABS
0.1250 mg | ORAL_TABLET | Freq: Every evening | ORAL | Status: DC
  Administered 2014-11-10: 0.125 mg via ORAL
  Filled 2014-11-10 (×2): qty 1

## 2014-11-10 MED ORDER — TRAMADOL HCL 50 MG PO TABS: 50.0000 mg | ORAL_TABLET | Freq: Four times a day (QID) | ORAL | Status: DC | PRN

## 2014-11-10 MED ORDER — LEVOTHYROXINE SODIUM 75 MCG PO TABS
75.0000 ug | ORAL_TABLET | Freq: Every day | ORAL | Status: DC
  Administered 2014-11-11: 75 ug via ORAL
  Filled 2014-11-10: qty 1

## 2014-11-10 MED ORDER — ESTRADIOL 0.1 MG/GM VA CREA: 2.0000 g | TOPICAL_CREAM | VAGINAL | Status: DC

## 2014-11-10 MED ORDER — SODIUM CHLORIDE 0.9 % IV SOLN: 250.0000 mL | INTRAVENOUS | Status: DC | PRN

## 2014-11-10 MED ORDER — VITAMIN C 500 MG PO TABS
500.0000 mg | ORAL_TABLET | Freq: Every day | ORAL | Status: DC
  Administered 2014-11-11: 500 mg via ORAL
  Filled 2014-11-10: qty 1

## 2014-11-10 MED ORDER — ATENOLOL 25 MG PO TABS
25.0000 mg | ORAL_TABLET | Freq: Three times a day (TID) | ORAL | Status: DC
  Administered 2014-11-10 – 2014-11-11 (×2): 25 mg via ORAL
  Filled 2014-11-10 (×2): qty 1

## 2014-11-10 MED ORDER — LATANOPROST 0.005 % OP SOLN
1.0000 [drp] | Freq: Every day | OPHTHALMIC | Status: DC
  Administered 2014-11-10: 1 [drp] via OPHTHALMIC
  Filled 2014-11-10: qty 2.5

## 2014-11-10 NOTE — H&P (Addendum)
Kristen Whitehead is an 81 y.o. female.    [name] [facility], pcp)  Chief Complaint: chest pain HPI: 81 yo female with hx of chronic afib (CHADS2=3), mvp, hypothyroidism c/o neck pain, and radiation to the left arm and squeezing in her chest on Tuesday while at [facility],  The pain lasted for about 1-2 hours.  Pt has been tired since then.  Pt was told by her friends to go to the ER due to persistent weakness.  Pt denies focal neurological symptoms, dysuria, hematuria, fever, chills, cough.  Pt was seen in ED and ekg showed Afib, CXR negative, ua pending.  CT scan brain pending.  Pt was noted to be hypokalemic.  Pt will be admitted for weakness and also chest pain evaluation.   Past Medical History  Diagnosis Date  . History of mitral valve prolapse 06/15/2008    a. echo 1/14: mild LVH, EF 60-65%, mild MR, mild to mod BAE, PASP 35  . Chronic atrial fibrillation     managed with rate control and coumadin  . History of congenital mitral regurgitation     mild  . Hypothyroidism   . Hypercholesterolemia   . GERD (gastroesophageal reflux disease)   . Chest pain     no known ischemic heart disease; negative Myoview July 2013. EF 75% with no ischemia.   . Hypertension   . Bladder prolapse, female, acquired   . Chronic diastolic heart failure   . Chronic anticoagulation   . Heart disease   . Cancer     thyroid    Past Surgical History  Procedure Laterality Date  . Thyroidectomy    . Cholecystectomy    . Abdominal hysterectomy    . Cataract extraction      Family History  Problem Relation Age of Onset  . Heart attack Neg Hx   . Heart disease Neg Hx   . Stroke Mother   . Stroke Father    Social History:  reports that she has never smoked. She has never used smokeless tobacco. She reports that she does not drink alcohol or use illicit drugs.  Allergies:  Allergies  Allergen Reactions  . Azithromycin   . Compazine [Prochlorperazine Edisylate]   . Demerol   . Dolophine  [Methadone]   . Ebastine     EBS  . Erythromycin   . Ibuprofen   . Statins     myalgias  . Tetanus Toxoids   . Tetracyclines & Related    Medications reviewed  (Not in a hospital admission)  Results for orders placed or performed during the hospital encounter of 11/10/14 (from the past 48 hour(s))  CBC  (at [facility])     Status: Abnormal   Collection Time: 11/10/14  3:53 PM  Result Value Ref Range   WBC 7.4 4.0 - 10.5 K/uL   RBC 4.62 3.87 - 5.11 MIL/uL   Hemoglobin 14.4 12.0 - 15.0 g/dL   HCT 43.4 36.0 - 46.0 %   MCV 93.9 78.0 - 100.0 fL   MCH 31.2 26.0 - 34.0 pg   MCHC 33.2 30.0 - 36.0 g/dL   RDW 17.1 (H) 11.5 - 15.5 %   Platelets 213 150 - 400 K/uL  Basic metabolic panel  (at [facility])     Status: Abnormal   Collection Time: 11/10/14  3:53 PM  Result Value Ref Range   Sodium 140 135 - 145 mmol/L   Potassium 4.1 3.5 - 5.1 mmol/L  Chloride 103 96 - 112 mmol/L   CO2 31 19 - 32 mmol/L   Glucose, Bld 84 70 - 99 mg/dL   BUN 8 6 - 23 mg/dL   Creatinine, Ser 0.71 0.50 - 1.10 mg/dL   Calcium 8.1 (L) 8.4 - 10.5 mg/dL   GFR calc non Af Amer 74 (L) >90 mL/min   GFR calc Af Amer 86 (L) >90 mL/min    Comment: (NOTE) The eGFR has been calculated using the CKD EPI equation. This calculation has not been validated in all clinical situations. eGFR's persistently <90 mL/min signify possible Chronic Kidney Disease.    Anion gap 6 5 - 15  Protime-INR     Status: Abnormal   Collection Time: 11/10/14  3:53 PM  Result Value Ref Range   Prothrombin Time 24.4 (H) 11.6 - 15.2 seconds   INR 2.18 (H) 0.00 - 1.49  I-Stat Troponin, ED (not at [facility])     Status: None   Collection Time: 11/10/14  4:00 PM  Result Value Ref Range   Troponin i, poc 0.00 0.00 - 0.08 ng/mL   Comment 3            Comment: Due to the release kinetics of cTnI, a negative result within the first hours of the onset of symptoms does not rule out myocardial infarction with certainty. If  myocardial infarction is still suspected, repeat the test at appropriate intervals.    Dg Chest 2 View  11/10/2014   CLINICAL DATA:  81 year old female with 2 days of weakness. Chest and jaw pain. Initial encounter.  EXAM: CHEST  2 VIEW  COMPARISON:  09/05/2014 and earlier.  FINDINGS: Stable cardiac size and mediastinal contours. Stable lung volumes. No pneumothorax, pulmonary edema, pleural effusion or acute pulmonary opacity. Stable mild increased interstitial markings. Osteopenia. No acute osseous abnormality identified. Stable cholecystectomy clips. Stable thyroid bed surgical clips.  IMPRESSION: No acute cardiopulmonary abnormality.   Electronically Signed   By: Genevie Ann M.D.   On: 11/10/2014 16:29    Review of Systems  Constitutional: Negative for fever, chills, weight loss, malaise/fatigue and diaphoresis.  HENT: Negative.   Eyes: Negative.   Respiratory: Negative for cough, hemoptysis, sputum production, shortness of breath and wheezing.   Cardiovascular: Positive for chest pain. Negative for palpitations, orthopnea, claudication, leg swelling and PND.  Gastrointestinal: Negative for heartburn, nausea, vomiting, abdominal pain, diarrhea, constipation, blood in stool and melena.  Genitourinary: Negative for dysuria, urgency, frequency, hematuria and flank pain.  Musculoskeletal: Negative for myalgias, back pain, joint pain, falls and neck pain.  Skin: Negative.   Neurological: Positive for weakness. Negative for dizziness, tingling, tremors, sensory change, speech change, focal weakness, seizures and loss of consciousness.  Endo/Heme/Allergies: Negative for environmental allergies and polydipsia. Does not bruise/bleed easily.  Psychiatric/Behavioral: Negative for depression, suicidal ideas, hallucinations, memory loss and substance abuse. The patient is not nervous/anxious and does not have insomnia.     Blood pressure 176/68, pulse 80, temperature 98.3 F (36.8 C), temperature source  Oral, resp. rate 20, SpO2 99 %. Physical Exam  Constitutional: She is oriented to person, place, and time. She appears well-developed and well-nourished.  HENT:  Head: Normocephalic and atraumatic.  Eyes: Conjunctivae and EOM are normal. Pupils are equal, round, and reactive to light. No scleral icterus.  Neck: Normal range of motion. Neck supple. No JVD present. No tracheal deviation present. No thyromegaly present.  Cardiovascular: Exam reveals no gallop and no friction rub.   Murmur heard. Irr, irr s1,  s2  Respiratory: Effort normal and breath sounds normal. No respiratory distress. She has no wheezes. She has no rales. She exhibits no tenderness.  GI: Soft. Bowel sounds are normal. She exhibits no distension. There is no tenderness. There is no rebound and no guarding.  Musculoskeletal: Normal range of motion. She exhibits no edema or tenderness.  Lymphadenopathy:    She has no cervical adenopathy.  Neurological: She is alert and oriented to person, place, and time. She has normal reflexes. She displays normal reflexes. No cranial nerve deficit. She exhibits normal muscle tone. Coordination normal.  Skin: Skin is warm and dry. No rash noted. No erythema. No pallor.  Psychiatric: She has a normal mood and affect. Her behavior is normal. Judgment and thought content normal.     Assessment/Plan Weakness Check urinalysis, check CT scan brain Check esr   Chest pain Telemetry Check cpk, mb, trop i q6h x3 Check echocardiogram  Hypothyroidism Cont levothyroxine Check tsh  Pafib (CHADS2=3) Coumadin pharmacy to dose  Hypocalcemia Check calcium, check magnesium  DVT prophylaxis:  Scd, cont coumadin   Jani Gravel 11/10/2014, 7:34 PM

## 2014-11-10 NOTE — Progress Notes (Signed)
ANTICOAGULATION CONSULT NOTE - Initial Consult  Pharmacy Consult for Coumadin Indication: atrial fibrillation  Allergies  Allergen Reactions  . Azithromycin   . Compazine [Prochlorperazine Edisylate]   . Demerol   . Dolophine [Methadone]   . Ebastine     EBS  . Erythromycin   . Ibuprofen   . Statins     myalgias  . Tetanus Toxoids   . Tetracyclines & Related     Patient Measurements: Height: 5' (152.4 cm) Weight: 140 lb 12.8 oz (63.866 kg) IBW/kg (Calculated) : 45.5  Vital Signs: Temp: 98.2 F (36.8 C) (03/24 2204) Temp Source: Oral (03/24 2204) BP: 149/63 mmHg (03/24 2204) Pulse Rate: 67 (03/24 2204)  Labs:  Recent Labs  11/10/14 1553  HGB 14.4  HCT 43.4  PLT 213  LABPROT 24.4*  INR 2.18*  CREATININE 0.71    Estimated Creatinine Clearance: 39.8 mL/min (by C-G formula based on Cr of 0.71).   Medical History: Past Medical History  Diagnosis Date  . History of mitral valve prolapse 06/15/2008    a. echo 1/14: mild LVH, EF 60-65%, mild MR, mild to mod BAE, PASP 35  . Chronic atrial fibrillation     managed with rate control and coumadin  . History of congenital mitral regurgitation     mild  . Hypothyroidism   . Hypercholesterolemia   . GERD (gastroesophageal reflux disease)   . Chest pain     no known ischemic heart disease; negative Myoview July 2013. EF 75% with no ischemia.   . Hypertension   . Bladder prolapse, female, acquired   . Chronic diastolic heart failure   . Chronic anticoagulation   . Heart disease   . Cancer     thyroid    Assessment: 81 year old female admitted with chest pain on Coumadin PTA for Afib Admit INR therapeutic at 2.18 Dose PTA = 2.5 mg Wednesday, Sunday // 5 mg other days  (last dose 3/23)  Goal of Therapy:  INR 2-3 Monitor platelets by anticoagulation protocol: Yes   Plan:  Coumadin 5 mg po x 1 dose tonight Daily INR  Thank you. Anette Guarneri, PharmD [phone]  Tad Moore 11/10/2014,10:27 PM

## 2014-11-10 NOTE — ED Notes (Signed)
Attempt to call report to floor x1. 

## 2014-11-10 NOTE — ED Notes (Signed)
Report given to [place], RN on 3E - states she will call Rapid Response to evaluate pt b/c pt is still having rib cage pain.

## 2014-11-10 NOTE — ED Notes (Signed)
Pt reports 2 days ago generalized weakness. Also 2 days ago developed CP and jaw pain. No pain at current, states she feels "fuzzy headed and sleepy". Neuro intact. C/o headache to back of head. Pt is able to stand. Is a x 4.

## 2014-11-10 NOTE — ED Notes (Signed)
Paged Dr. Maudie Mercury - okay for pt to go to telemetry floor, no new orders at this time.

## 2014-11-10 NOTE — ED Notes (Signed)
Report given to RN on 3W  

## 2014-11-10 NOTE — ED Provider Notes (Signed)
CSN: [id number]     Arrival date & time 11/10/14  1535 History   First MD Initiated Contact with Patient 11/10/14 1811     Chief Complaint  Patient presents with  . Weakness     (Consider location/radiation/quality/duration/timing/severity/associated sxs/prior Treatment) Patient is a 81 y.o. female presenting with weakness. The history is provided by the patient. No language interpreter was used.  Weakness This is a new problem. The current episode started in the past 7 days. The problem occurs constantly. The problem has been gradually worsening. Associated symptoms include chest pain, headaches, neck pain and weakness. Pertinent negatives include no abdominal pain or fever.    Past Medical History  Diagnosis Date  . History of mitral valve prolapse 06/15/2008    a. echo 1/14: mild LVH, EF 60-65%, mild MR, mild to mod BAE, PASP 35  . Chronic atrial fibrillation     managed with rate control and coumadin  . History of congenital mitral regurgitation     mild  . Hypothyroidism   . Hypercholesterolemia   . GERD (gastroesophageal reflux disease)   . Chest pain     no known ischemic heart disease; negative Myoview July 2013. EF 75% with no ischemia.   . Hypertension   . Bladder prolapse, female, acquired   . Chronic diastolic heart failure   . Chronic anticoagulation   . Heart disease   . Cancer     thyroid   Past Surgical History  Procedure Laterality Date  . Thyroidectomy    . Cholecystectomy    . Abdominal hysterectomy     Family History  Problem Relation Age of Onset  . Heart attack Neg Hx   . Heart disease Neg Hx   . Stroke Mother   . Stroke Father    History  Substance Use Topics  . Smoking status: Never Smoker   . Smokeless tobacco: Never Used  . Alcohol Use: No   OB History    No data available     Review of Systems  Constitutional: Negative for fever.  Cardiovascular: Positive for chest pain.  Gastrointestinal: Negative for abdominal pain.   Musculoskeletal: Positive for neck pain.  Neurological: Positive for weakness and headaches.  All other systems reviewed and are negative.     Allergies  Azithromycin; Compazine; Demerol; Dolophine; Ebastine; Erythromycin; Ibuprofen; Statins; Tetanus toxoids; and Tetracyclines & related  Home Medications   Prior to Admission medications   Medication Sig Start Date End Date Taking? Authorizing Provider  amLODipine (NORVASC) 2.5 MG tablet TAKE 1 TABLET EVERY DAY 05/09/14  Yes Darlin Coco, MD  atenolol (TENORMIN) 25 MG tablet TAKE 1 TABLET  TWO  TO THREE TIMES DAILY AS DIRECTED Patient taking differently: tid 05/09/14  Yes Darlin Coco, MD  beta carotene w/minerals (OCUVITE) tablet Take 1 tablet by mouth daily.   Yes Historical Provider, MD  calcium carbonate (OS-CAL - DOSED IN MG OF ELEMENTAL CALCIUM) 1250 MG tablet Take 1 tablet by mouth 2 (two) times daily.    Yes Historical Provider, MD  Cholecalciferol (VITAMIN D) 2000 UNITS tablet Take 2,000 Units by mouth daily.    Yes Historical Provider, MD  estradiol (ESTRACE VAGINAL) 0.1 MG/GM vaginal cream Place 2 g vaginally once a week.    Yes Historical Provider, MD  fluticasone (FLONASE) 50 MCG/ACT nasal spray Place 1 spray into both nostrils as needed for allergies.  08/09/13  Yes Historical Provider, MD  furosemide (LASIX) 20 MG tablet Take 2 tablets in the morning and 1 tablet  in the afternnon 09/28/14  Yes Darlin Coco, MD  isosorbide mononitrate (IMDUR) 30 MG 24 hr tablet TAKE 1 TABLET EVERY DAY 05/09/14  Yes Darlin Coco, MD  latanoprost (XALATAN) 0.005 % ophthalmic solution Place 1 drop into the left eye at bedtime.  02/23/13  Yes Historical Provider, MD  levothyroxine (SYNTHROID, LEVOTHROID) 75 MCG tablet Take 75 mcg by mouth daily.     Yes Historical Provider, MD  NITROSTAT 0.4 MG SL tablet DISSOLVE 1 TABLET UNDER THE TONGUE EVERY 5 MINUTES FOR 3 DOSES AS NEEDED  FOR  CHEST  PAIN   Yes Darlin Coco, MD  potassium  chloride (K-DUR) 10 MEQ tablet Take 1 tablet (10 mEq total) by mouth 2 (two) times daily. Patient taking differently: Take 20 mEq by mouth daily.  05/11/14  Yes Darlin Coco, MD  pramipexole (MIRAPEX) 0.125 MG tablet Take 1 tablet (0.125 mg total) by mouth every evening. 07/21/14  Yes Star Age, MD  RESTASIS 0.05 % ophthalmic emulsion Place 1 drop into both eyes 2 (two) times daily.  07/29/14  Yes Historical Provider, MD  temazepam (RESTORIL) 15 MG capsule Take 15 mg by mouth at bedtime.   Yes Historical Provider, MD  traMADol (ULTRAM) 50 MG tablet Take 1 tablet (50 mg total) by mouth every 6 (six) hours as needed. 08/01/14  Yes Charlesetta Shanks, MD  vitamin C (ASCORBIC ACID) 500 MG tablet Take 500 mg by mouth daily.   Yes Historical Provider, MD  warfarin (COUMADIN) 5 MG tablet TAKE AS DIRECTED BY ANTICOAGULATION CLINIC Patient taking differently: TAKE AS DIRECTED BY ANTICOAGULATION CLINIC. Wednesday and Sunday take 2.5 mg. Mon, Tues, Thu, Fri, and Sat is 5mg  05/09/14  Yes Darlin Coco, MD   BP 157/73 mmHg  Pulse 73  Temp(Src) 98.9 F (37.2 C) (Oral)  Resp 18  SpO2 98% Physical Exam  Constitutional: She is oriented to person, place, and time. She appears well-developed and well-nourished.  HENT:  Head: Normocephalic.  Eyes: Pupils are equal, round, and reactive to light.  Neck: Neck supple.  Cardiovascular: Normal rate and regular rhythm.   Pulmonary/Chest: Effort normal and breath sounds normal. She has no rales. She exhibits tenderness.  Abdominal: Soft. Bowel sounds are normal.  Musculoskeletal: She exhibits no edema or tenderness.  Lymphadenopathy:    She has no cervical adenopathy.  Neurological: She is alert and oriented to person, place, and time.  Skin: Skin is warm and dry.  Psychiatric: She has a normal mood and affect.  Nursing note and vitals reviewed.   ED Course  Procedures (including critical care time) Labs Review Labs Reviewed  CBC - Abnormal; Notable for  the following:    RDW 17.1 (*)    All other components within normal limits  BASIC METABOLIC PANEL - Abnormal; Notable for the following:    Calcium 8.1 (*)    GFR calc non Af Amer 74 (*)    GFR calc Af Amer 86 (*)    All other components within normal limits  PROTIME-INR - Abnormal; Notable for the following:    Prothrombin Time 24.4 (*)    INR 2.18 (*)    All other components within normal limits  Randolm Idol, ED    Imaging Review Dg Chest 2 View  11/10/2014   CLINICAL DATA:  81 year old female with 2 days of weakness. Chest and jaw pain. Initial encounter.  EXAM: CHEST  2 VIEW  COMPARISON:  09/05/2014 and earlier.  FINDINGS: Stable cardiac size and mediastinal contours. Stable lung volumes. No  pneumothorax, pulmonary edema, pleural effusion or acute pulmonary opacity. Stable mild increased interstitial markings. Osteopenia. No acute osseous abnormality identified. Stable cholecystectomy clips. Stable thyroid bed surgical clips.  IMPRESSION: No acute cardiopulmonary abnormality.   Electronically Signed   By: Genevie Ann M.D.   On: 11/10/2014 16:29     EKG Interpretation None     81 year old female reports sudden onset of weakness while shopping two days ago, associated with pressure like pain in left anterior chest with radiation to left side of jaw, mild shortness of breath.  Pressure lasted about 30 minutes and spontaneously resolved without intervention, but patient continues to report persistent weakness.  Patient also reporting bilateral lower rib pain, onset today, increased with deep inspiration. Mild occipital headache.  History of atrial fib, MVP and diastolic heart failure.  No history of ischemic heart disease.  Negative troponin.  No anemia or leukocytosis.  Therapeutic INR. No acute findings on CXR.  Discussed with Dr. Regenia Skeeter and with hospitalist Maudie Mercury).  Admit to telemetry. MDM   Final diagnoses:  None    Weakness--question cardiac etiology.    Etta Quill,  NP 11/10/14 3295  Sherwood Gambler, MD 11/14/14 [phone]

## 2014-11-10 NOTE — ED Notes (Signed)
Attempt to call report to floor x2. 

## 2014-11-10 NOTE — ED Notes (Signed)
Pt continues to c/o bilateral rib cage pain, rated 5/10, admitting MD paged to discuss treatment of pain and plan for patients admission, telemetry floor vs step down.

## 2014-11-10 NOTE — ED Notes (Signed)
Attempted to call report to floor-no answer.

## 2014-11-11 DIAGNOSIS — I4891 Unspecified atrial fibrillation: Secondary | ICD-10-CM | POA: Diagnosis not present

## 2014-11-11 DIAGNOSIS — R42 Dizziness and giddiness: Secondary | ICD-10-CM | POA: Diagnosis not present

## 2014-11-11 DIAGNOSIS — I10 Essential (primary) hypertension: Secondary | ICD-10-CM | POA: Diagnosis not present

## 2014-11-11 DIAGNOSIS — E039 Hypothyroidism, unspecified: Secondary | ICD-10-CM | POA: Diagnosis not present

## 2014-11-11 DIAGNOSIS — M542 Cervicalgia: Secondary | ICD-10-CM | POA: Diagnosis not present

## 2014-11-11 DIAGNOSIS — K219 Gastro-esophageal reflux disease without esophagitis: Secondary | ICD-10-CM | POA: Diagnosis not present

## 2014-11-11 DIAGNOSIS — I482 Chronic atrial fibrillation: Secondary | ICD-10-CM | POA: Diagnosis not present

## 2014-11-11 DIAGNOSIS — R51 Headache: Secondary | ICD-10-CM | POA: Diagnosis not present

## 2014-11-11 DIAGNOSIS — R531 Weakness: Secondary | ICD-10-CM | POA: Diagnosis not present

## 2014-11-11 DIAGNOSIS — E780 Pure hypercholesterolemia: Secondary | ICD-10-CM | POA: Diagnosis not present

## 2014-11-11 DIAGNOSIS — R079 Chest pain, unspecified: Secondary | ICD-10-CM | POA: Diagnosis not present

## 2014-11-11 LAB — COMPREHENSIVE METABOLIC PANEL
ALBUMIN: 3.1 g/dL — AB (ref 3.5–5.2)
ALK PHOS: 59 U/L (ref 39–117)
ALT: 31 U/L (ref 0–35)
ALT: 32 U/L (ref 0–35)
AST: 33 U/L (ref 0–37)
AST: 33 U/L (ref 0–37)
Albumin: 3 g/dL — ABNORMAL LOW (ref 3.5–5.2)
Alkaline Phosphatase: 63 U/L (ref 39–117)
Anion gap: 9 (ref 5–15)
Anion gap: 6 (ref 5–15)
BILIRUBIN TOTAL: 0.5 mg/dL (ref 0.3–1.2)
BUN: 10 mg/dL (ref 6–23)
BUN: 10 mg/dL (ref 6–23)
CHLORIDE: 104 mmol/L (ref 96–112)
CO2: 25 mmol/L (ref 19–32)
CO2: 29 mmol/L (ref 19–32)
Calcium: 7.9 mg/dL — ABNORMAL LOW (ref 8.4–10.5)
Calcium: 7.7 mg/dL — ABNORMAL LOW (ref 8.4–10.5)
Chloride: 104 mmol/L (ref 96–112)
Creatinine, Ser: 0.66 mg/dL (ref 0.50–1.10)
Creatinine, Ser: 0.67 mg/dL (ref 0.50–1.10)
GFR calc Af Amer: 88 mL/min — ABNORMAL LOW (ref >90)
GFR calc non Af Amer: 76 mL/min — ABNORMAL LOW (ref >90)
GFR, EST AFRICAN AMERICAN: 88 mL/min — AB (ref >90)
GFR, EST NON AFRICAN AMERICAN: 76 mL/min — AB (ref >90)
GLUCOSE: 94 mg/dL (ref 70–99)
Glucose, Bld: 95 mg/dL (ref 70–99)
Potassium: 3.7 mmol/L (ref 3.5–5.1)
Potassium: 3.8 mmol/L (ref 3.5–5.1)
SODIUM: 139 mmol/L (ref 135–145)
Sodium: 138 mmol/L (ref 135–145)
TOTAL PROTEIN: 6 g/dL (ref 6.0–8.3)
Total Bilirubin: 0.8 mg/dL (ref 0.3–1.2)
Total Protein: 5.5 g/dL — ABNORMAL LOW (ref 6.0–8.3)

## 2014-11-11 LAB — CK TOTAL AND CKMB (NOT AT ARMC)
CK, MB: 2.5 ng/mL (ref 0.3–4.0)
CK, MB: 1.9 ng/mL (ref 0.3–4.0)
RELATIVE INDEX: INVALID (ref 0.0–2.5)
Relative Index: INVALID (ref 0.0–2.5)
Total CK: 64 U/L (ref 7–177)
Total CK: 65 U/L (ref 7–177)

## 2014-11-11 LAB — CBC
HEMATOCRIT: 41.2 % (ref 36.0–46.0)
HEMOGLOBIN: 13.5 g/dL (ref 12.0–15.0)
MCH: 30.6 pg (ref 26.0–34.0)
MCHC: 32.8 g/dL (ref 30.0–36.0)
MCV: 93.4 fL (ref 78.0–100.0)
Platelets: 204 10*3/uL (ref 150–400)
RBC: 4.41 MIL/uL (ref 3.87–5.11)
RDW: 16.9 % — AB (ref 11.5–15.5)
WBC: 5.7 10*3/uL (ref 4.0–10.5)

## 2014-11-11 LAB — TROPONIN I
TROPONIN I: 0.03 ng/mL (ref <0.031)
Troponin I: <0.03 ng/mL (ref <0.031)

## 2014-11-11 LAB — MAGNESIUM: Magnesium: 1.9 mg/dL (ref 1.5–2.5)

## 2014-11-11 LAB — PROTIME-INR
INR: 1.93 — AB (ref 0.00–1.49)
Prothrombin Time: 22.3 seconds — ABNORMAL HIGH (ref 11.6–15.2)

## 2014-11-11 LAB — TSH: TSH: 4.393 u[IU]/mL (ref 0.350–4.500)

## 2014-11-11 LAB — SEDIMENTATION RATE: Sed Rate: 17 mm/hr (ref 0–22)

## 2014-11-11 MED ORDER — ALUM & MAG HYDROXIDE-SIMETH 200-200-20 MG/5ML PO SUSP
30.0000 mL | Freq: Once | ORAL | Status: AC
  Administered 2014-11-11: 30 mL via ORAL
  Filled 2014-11-11: qty 30

## 2014-11-11 MED ORDER — ATENOLOL 25 MG PO TABS: 25.0000 mg | ORAL_TABLET | Freq: Two times a day (BID) | ORAL | Status: DC

## 2014-11-11 MED ORDER — ATENOLOL 25 MG PO TABS: 25.0000 mg | ORAL_TABLET | Freq: Three times a day (TID) | ORAL | Status: DC

## 2014-11-11 NOTE — Discharge Summary (Signed)
Physician Discharge Summary  Kristen Whitehead KYH:[id number] DOB: 1923-01-22 DOA: 11/10/2014  PCP: Gennette Pac, MD  Admit date: 11/10/2014 Discharge date: 11/11/2014  Time spent: *25 minutes  Recommendations for Outpatient Follow-up:  1. Follow up cardiology in one week 2. Follow up PCP in 2 weeks  Discharge Diagnoses:  Active Problems:   Atrial fibrillation   Hypothyroidism   Weakness   Chest pain   Discharge Condition: Stable  Diet recommendation: Low salt diet  Filed Weights   11/10/14 2204  Weight: 63.866 kg (140 lb 12.8 oz)    History of present illness:  81 yo female with hx of chronic afib (CHADS2=3), mvp, hypothyroidism c/o neck pain, and radiation to the left arm and squeezing in her chest on Tuesday while at [facility], The pain lasted for about 1-2 hours. Pt has been tired since then. Pt was told by her friends to go to the ER due to persistent weakness. Pt denies focal neurological symptoms, dysuria, hematuria, fever, chills, cough. Pt was seen in ED and ekg showed Afib, CXR negative, ua negative. CT scan brain negative. Pt was noted to be hypokalemic. Pt will be admitted for weakness and also chest pain evaluation  Hospital Course:   Chest pain- patient was seen by cardiology, cardiac enzymes 2 negative. At this time chest pain has resolved.  Postural  hypertension- patient was found to be hypotensive on standing. Discussed with cardiology Dr. [name] Challenger, and she recommends to discontinue amlodipine and continue other medications. Patient will follow with Dr. [name] Challenger in one week. Patient also recommended to use elastic stockings while out of bed.  Social situation- patient's friends were concerned that patient's house is not in good condition. Will get home health RN, aide and social work to check on the patient's living condition.  Hypothyroidism- patient's TSH within normal range. Continue  Synthroid.     Procedures:  None  Consultations:  Cardiology  Discharge Exam: Filed Vitals:   11/11/14 0554  BP: 140/60  Pulse: 60  Temp: 98.7 F (37.1 C)  Resp: 16    General: Appearing  In no acute distress Cardiovascular: S1-S2 regular Respiratory: Clear bilaterally  Discharge Instructions   Discharge Instructions    Diet - low sodium heart healthy    Complete by:  As directed      Increase activity slowly    Complete by:  As directed           Current Discharge Medication List    CONTINUE these medications which have NOT CHANGED   Details  atenolol (TENORMIN) 25 MG tablet TAKE 1 TABLET  TWO  TO THREE TIMES DAILY AS DIRECTED Qty: 260 tablet, Refills: 1    beta carotene w/minerals (OCUVITE) tablet Take 1 tablet by mouth daily.    calcium carbonate (OS-CAL - DOSED IN MG OF ELEMENTAL CALCIUM) 1250 MG tablet Take 1 tablet by mouth 2 (two) times daily.     Cholecalciferol (VITAMIN D) 2000 UNITS tablet Take 2,000 Units by mouth daily.     estradiol (ESTRACE VAGINAL) 0.1 MG/GM vaginal cream Place 2 g vaginally once a week.     fluticasone (FLONASE) 50 MCG/ACT nasal spray Place 1 spray into both nostrils as needed for allergies.     furosemide (LASIX) 20 MG tablet Take 2 tablets in the morning and 1 tablet in the afternnon Qty: 90 tablet, Refills: 3    isosorbide mononitrate (IMDUR) 30 MG 24 hr tablet TAKE 1 TABLET EVERY DAY Qty: 90 tablet, Refills: 1  latanoprost (XALATAN) 0.005 % ophthalmic solution Place 1 drop into the left eye at bedtime.     levothyroxine (SYNTHROID, LEVOTHROID) 75 MCG tablet Take 75 mcg by mouth daily.      NITROSTAT 0.4 MG SL tablet DISSOLVE 1 TABLET UNDER THE TONGUE EVERY 5 MINUTES FOR 3 DOSES AS NEEDED  FOR  CHEST  PAIN Qty: 25 tablet, Refills: 5    potassium chloride (K-DUR) 10 MEQ tablet Take 1 tablet (10 mEq total) by mouth 2 (two) times daily. Qty: 60 tablet, Refills: 2    pramipexole (MIRAPEX) 0.125 MG tablet Take 1  tablet (0.125 mg total) by mouth every evening. Qty: 90 tablet, Refills: 3   Associated Diagnoses: RLS (restless legs syndrome)    RESTASIS 0.05 % ophthalmic emulsion Place 1 drop into both eyes 2 (two) times daily.     temazepam (RESTORIL) 15 MG capsule Take 15 mg by mouth at bedtime.    traMADol (ULTRAM) 50 MG tablet Take 1 tablet (50 mg total) by mouth every 6 (six) hours as needed. Qty: 20 tablet, Refills: 0    vitamin C (ASCORBIC ACID) 500 MG tablet Take 500 mg by mouth daily.    warfarin (COUMADIN) 5 MG tablet TAKE AS DIRECTED BY ANTICOAGULATION CLINIC Qty: 90 tablet, Refills: 1      STOP taking these medications     amLODipine (NORVASC) 2.5 MG tablet        Allergies  Allergen Reactions  . Azithromycin   . Compazine [Prochlorperazine Edisylate]   . Demerol   . Dolophine [Methadone]   . Ebastine     EBS  . Erythromycin   . Ibuprofen   . Statins     myalgias  . Tetanus Toxoids   . Tetracyclines & Related    Follow-up Information    Follow up with Dorris Carnes, MD. Schedule an appointment as soon as possible for a visit in 1 week.   Specialty:  Cardiology   Contact information:   [place] [address] [phone]        The results of significant diagnostics from this hospitalization (including imaging, microbiology, ancillary and laboratory) are listed below for reference.    Significant Diagnostic Studies: Dg Chest 2 View  11/10/2014   CLINICAL DATA:  81 year old female with 2 days of weakness. Chest and jaw pain. Initial encounter.  EXAM: CHEST  2 VIEW  COMPARISON:  09/05/2014 and earlier.  FINDINGS: Stable cardiac size and mediastinal contours. Stable lung volumes. No pneumothorax, pulmonary edema, pleural effusion or acute pulmonary opacity. Stable mild increased interstitial markings. Osteopenia. No acute osseous abnormality identified. Stable cholecystectomy clips. Stable thyroid bed surgical clips.  IMPRESSION: No acute  cardiopulmonary abnormality.   Electronically Signed   By: Genevie Ann M.D.   On: 11/10/2014 16:29   Dg Thoracic Spine W/swimmers  10/19/2014   CLINICAL DATA:  Chronic mid thoracic spine pain with acute right hip joint pain radiating down to the knee for 1 month. Neuropathy in the bilateral lower extremities. No trauma. Initial encounter.  EXAM: THORACIC SPINE - 2 VIEW + SWIMMERS  COMPARISON:  Chest radiograph 09/05/2014.  FINDINGS: Mild dextro convex curvature of the thoracolumbar spine. Alignment is otherwise anatomic. Vertebral body height is maintained. Endplate degenerative changes are seen throughout the mid and lower thoracic spine. Cervicothoracic junction is in alignment. Thyroidectomy clips are noted.  IMPRESSION: Spondylosis and mild scoliosis.  No acute findings.   Electronically Signed   By: Lorin Picket M.D.  On: 10/19/2014 17:43   Ct Head Wo Contrast  11/11/2014   CLINICAL DATA:  Acute onset of occipital headache and dizziness. Generalized weakness. Initial encounter.  EXAM: CT HEAD WITHOUT CONTRAST  TECHNIQUE: Contiguous axial images were obtained from the base of the skull through the vertex without intravenous contrast.  COMPARISON:  CT of the head performed 05/28/2012  FINDINGS: There is no evidence of acute infarction, mass lesion, or intra- or extra-axial hemorrhage on CT.  Prominence of the ventricles and sulci reflects mild to moderate cortical volume loss. Mild cerebellar atrophy is noted. Scattered periventricular and subcortical white matter change likely reflects small vessel ischemic microangiopathy.  The brainstem and fourth ventricle are within normal limits. The basal ganglia are unremarkable in appearance. The cerebral hemispheres demonstrate grossly normal gray-white differentiation. No mass effect or midline shift is seen.  There is no evidence of fracture; visualized osseous structures are unremarkable in appearance. The orbits are within normal limits. A mucus retention cyst  or polyp is noted at the right maxillary sinus. The remaining paranasal sinuses and mastoid air cells are well-aerated. No significant soft tissue abnormalities are seen.  IMPRESSION: 1. No acute intracranial pathology seen on CT. 2. Mild to moderate cortical volume loss and scattered small vessel ischemic microangiopathy. 3. Mucus retention cyst or polyp at the right maxillary sinus.   Electronically Signed   By: Garald Balding M.D.   On: 11/11/2014 01:24   Dg Hip Unilat With Pelvis 2-3 Views Right  10/19/2014   CLINICAL DATA:  Patient has acute pain in right hip that radiates down to lateral knee for about 1 month. Current history of neuropathy in bilateral lower extremities.  EXAM: RIGHT HIP (WITH PELVIS) 2-3 VIEWS  COMPARISON:  None.  FINDINGS: Both hips appear located. There is mild symmetric bilateral hip osteoarthritis with joint space narrowing and marginal spur formation. No acute fracture or subluxation identified. Lumbar degenerative disc disease noted.  IMPRESSION: 1. No acute findings. 2. Mild bilateral hip osteoarthritis. 3. Lumbar degenerative disc disease.   Electronically Signed   By: Kerby Moors M.D.   On: 10/19/2014 17:39    Microbiology: No results found for this or any previous visit (from the past 240 hour(s)).   Labs: Basic Metabolic Panel:  Recent Labs Lab 11/10/14 1553 11/11/14 0051 11/11/14 0452  NA 140 138 139  K 4.1 3.7 3.8  CL 103 104 104  CO2 31 25 29   GLUCOSE 84 95 94  BUN 8 10 10   CREATININE 0.71 0.66 0.67  CALCIUM 8.1* 7.9* 7.7*  MG  --  1.9  --    Liver Function Tests:  Recent Labs Lab 11/11/14 0051 11/11/14 0452  AST 33 33  ALT 31 32  ALKPHOS 59 63  BILITOT 0.5 0.8  PROT 5.5* 6.0  ALBUMIN 3.0* 3.1*   No results for input(s): LIPASE, AMYLASE in the last 168 hours. No results for input(s): AMMONIA in the last 168 hours. CBC:  Recent Labs Lab 11/10/14 1553 11/11/14 0452  WBC 7.4 5.7  HGB 14.4 13.5  HCT 43.4 41.2  MCV 93.9 93.4  PLT  213 204   Cardiac Enzymes:  Recent Labs Lab 11/11/14 0051 11/11/14 0452  CKTOTAL 64 65  CKMB 2.5 1.9  TROPONINI 0.03 <0.03   BNP: BNP (last 3 results) No results for input(s): BNP in the last 8760 hours.  ProBNP (last 3 results)  Recent Labs  08/01/14 1114  PROBNP 2372.0*    CBG: No results for input(s): GLUCAP  in the last 168 hours.     SignedEleonore Chiquito S  [facility] 11/11/2014, 1:40 PM

## 2014-11-11 NOTE — Progress Notes (Signed)
UR completed 

## 2014-11-11 NOTE — Care Management Note (Signed)
    Page 1 of 1   11/11/2014     1:51:22 PM CARE MANAGEMENT NOTE 11/11/2014  Patient:  Kristen Whitehead, Kristen Whitehead   Account Number:  [id number]  Date Initiated:  11/11/2014  Documentation initiated by:  GRAVES-BIGELOW,Cassandra Mcmanaman  Subjective/Objective Assessment:   Pt admitted for generalized weakness. Pt lives alone and has support of daughter.     Action/Plan:   Pt is agreeable to [facility] services. CM did make referral with [facility] for RN, SW and Aide. SOC to begin within 24-48 hrs post d/c. No further needs from CM at this time.   Anticipated DC Date:  11/11/2014   Anticipated DC Plan:  [place]  CM consult      [facility] Choice  HOME HEALTH   Choice offered to / List presented to:  C-1 Patient        [place] arranged  HH-1 RN  [place].   Status of service:  Completed, signed off Medicare Important Message given?  NO (If response is "NO", the following Medicare IM given date fields will be blank) Date Medicare IM given:   Medicare IM given by:   Date Additional Medicare IM given:   Additional Medicare IM given by:    Discharge Disposition:  [place]  Per UR Regulation:  Reviewed for med. necessity/level of care/duration of stay  If discussed at [place] of Stay Meetings, dates discussed:    Comments:

## 2014-11-11 NOTE — Consult Note (Addendum)
Primary Physician: Primary Cardiologist:  Mare Ferrari   HPI:  Askdd to see re CP  81 yo with history of atrial fib and diastolic CHF, HTN    FOllowed by T Brackbill  Seen in December   On Tuesday was at [facility] tired that day  Pushing cart  Felt like knees going to buckle Felt weak, Then Jaw and neck hurt  L arm  Squeezing in chest  [place] and sat in car  [place] away in about 1/2 hour.  Used to have spells like this in the past  Has not had in a long time   Would take nitro and go away  Has not taken in long time  Prays  Again, hasnt had in year   Wed was laying around house  No discomfort  Exhausted  Napped  Had nightmares  Finally friends told her she had to get checked out    Patient had myoview in 2013.  No ischemia  LVEF 75%  Echo in [place] 2015  LVEF 65 to 70%  Mild MR   Since here has not had jaw or arm pain  Tired because didn't sleep well last night  Had CT scan late      Past Medical History  Diagnosis Date  . History of mitral valve prolapse 06/15/2008    a. echo 1/14: mild LVH, EF 60-65%, mild MR, mild to mod BAE, PASP 35  . Chronic atrial fibrillation     managed with rate control and coumadin  . History of congenital mitral regurgitation     mild  . Hypothyroidism   . Hypercholesterolemia   . GERD (gastroesophageal reflux disease)   . Chest pain     no known ischemic heart disease; negative Myoview July 2013. EF 75% with no ischemia.   . Hypertension   . Bladder prolapse, female, acquired   . Chronic diastolic heart failure   . Chronic anticoagulation   . Heart disease   . Cancer     thyroid    Medications Prior to Admission  Medication Sig Dispense Refill  . amLODipine (NORVASC) 2.5 MG tablet TAKE 1 TABLET EVERY DAY 90 tablet 1  . atenolol (TENORMIN) 25 MG tablet TAKE 1 TABLET  TWO  TO THREE TIMES DAILY AS DIRECTED (Patient taking differently: tid) 260 tablet 1  . beta carotene w/minerals (OCUVITE) tablet Take 1 tablet by mouth daily.    . calcium carbonate  (OS-CAL - DOSED IN MG OF ELEMENTAL CALCIUM) 1250 MG tablet Take 1 tablet by mouth 2 (two) times daily.     . Cholecalciferol (VITAMIN D) 2000 UNITS tablet Take 2,000 Units by mouth daily.     Marland Kitchen estradiol (ESTRACE VAGINAL) 0.1 MG/GM vaginal cream Place 2 g vaginally once a week.     . fluticasone (FLONASE) 50 MCG/ACT nasal spray Place 1 spray into both nostrils as needed for allergies.     . furosemide (LASIX) 20 MG tablet Take 2 tablets in the morning and 1 tablet in the afternnon 90 tablet 3  . isosorbide mononitrate (IMDUR) 30 MG 24 hr tablet TAKE 1 TABLET EVERY DAY 90 tablet 1  . latanoprost (XALATAN) 0.005 % ophthalmic solution Place 1 drop into the left eye at bedtime.     Marland Kitchen levothyroxine (SYNTHROID, LEVOTHROID) 75 MCG tablet Take 75 mcg by mouth daily.      Marland Kitchen NITROSTAT 0.4 MG SL tablet DISSOLVE 1 TABLET UNDER THE TONGUE EVERY 5 MINUTES FOR 3 DOSES AS NEEDED  FOR  CHEST  PAIN 25 tablet 5  . potassium chloride (K-DUR) 10 MEQ tablet Take 1 tablet (10 mEq total) by mouth 2 (two) times daily. (Patient taking differently: Take 20 mEq by mouth daily. ) 60 tablet 2  . pramipexole (MIRAPEX) 0.125 MG tablet Take 1 tablet (0.125 mg total) by mouth every evening. 90 tablet 3  . RESTASIS 0.05 % ophthalmic emulsion Place 1 drop into both eyes 2 (two) times daily.     . temazepam (RESTORIL) 15 MG capsule Take 15 mg by mouth at bedtime.    . traMADol (ULTRAM) 50 MG tablet Take 1 tablet (50 mg total) by mouth every 6 (six) hours as needed. 20 tablet 0  . vitamin C (ASCORBIC ACID) 500 MG tablet Take 500 mg by mouth daily.    Marland Kitchen warfarin (COUMADIN) 5 MG tablet TAKE AS DIRECTED BY ANTICOAGULATION CLINIC (Patient taking differently: TAKE AS DIRECTED BY ANTICOAGULATION CLINIC. Wednesday and Sunday take 2.5 mg. Mon, Tues, Thu, Fri, and Sat is 5mg ) 90 tablet 1     . amLODipine  2.5 mg Oral Daily  . atenolol  25 mg Oral TID  . beta carotene w/minerals  1 tablet Oral Daily  . calcium carbonate  1 tablet Oral BID WC   . cholecalciferol  2,000 Units Oral Daily  . cycloSPORINE  1 drop Both Eyes BID  . [START ON 11/14/2014] estradiol  2 g Vaginal Weekly  . furosemide  20 mg Oral BID  . isosorbide mononitrate  30 mg Oral Daily  . latanoprost  1 drop Left Eye QHS  . levothyroxine  75 mcg Oral QAC breakfast  . potassium chloride  20 mEq Oral Daily  . pramipexole  0.125 mg Oral QPM  . sodium chloride  3 mL Intravenous Q12H  . sodium chloride  3 mL Intravenous Q12H  . temazepam  15 mg Oral QHS  . vitamin C  500 mg Oral Daily  . Warfarin - Pharmacist Dosing Inpatient   Does not apply q1800    Infusions:    Allergies  Allergen Reactions  . Azithromycin   . Compazine [Prochlorperazine Edisylate]   . Demerol   . Dolophine [Methadone]   . Ebastine     EBS  . Erythromycin   . Ibuprofen   . Statins     myalgias  . Tetanus Toxoids   . Tetracyclines & Related     History   Social History  . Marital Status: Widowed    Spouse Name: N/A  . Number of Children: 3  . Years of Education: BA   Occupational History  .     Social History Main Topics  . Smoking status: Never Smoker   . Smokeless tobacco: Never Used  . Alcohol Use: No  . Drug Use: No  . Sexual Activity: No   Other Topics Concern  . Not on file   Social History Narrative   Pt lives at home alone.   Caffeine Use: quit 38yrs ago    Family History  Problem Relation Age of Onset  . Heart attack Neg Hx   . Heart disease Neg Hx   . Stroke Mother   . Stroke Father     REVIEW OF SYSTEMS:  All systems reviewed  Negative to the above problem except as noted above.    PHYSICAL EXAM: Filed Vitals:   11/11/14 0554  BP: 140/60  Pulse: 60  Temp: 98.7 F (37.1 C)  Resp: 16     Intake/Output Summary (Last 24 hours)  at 11/11/14 0752 Last data filed at 11/10/14 2230  Gross per 24 hour  Intake    240 ml  Output      0 ml  Net    240 ml    General:  Well appearing. No respiratory difficulty HEENT: normal Neck: supple. no  JVD. Carotids 2+ bilat; no bruits. No lymphadenopathy or thryomegaly appreciated. Cor: PMI nondisplaced. Regular rate & rhythm. No rubs, gallops or murmurs. Lungs: clear Abdomen: soft, nontender, nondistended. No hepatosplenomegaly. No bruits or masses. Good bowel sounds. Extremities: no cyanosis, clubbing, rash, edema Neuro: alert & oriented x 3, cranial nerves grossly intact. moves all 4 extremities w/o difficulty. Affect pleasant.  ECG:  Atrial fib 65 bpm    Results for orders placed or performed during the hospital encounter of 11/10/14 (from the past 24 hour(s))  CBC  (at [facility])     Status: Abnormal   Collection Time: 11/10/14  3:53 PM  Result Value Ref Range   WBC 7.4 4.0 - 10.5 K/uL   RBC 4.62 3.87 - 5.11 MIL/uL   Hemoglobin 14.4 12.0 - 15.0 g/dL   HCT 43.4 36.0 - 46.0 %   MCV 93.9 78.0 - 100.0 fL   MCH 31.2 26.0 - 34.0 pg   MCHC 33.2 30.0 - 36.0 g/dL   RDW 17.1 (H) 11.5 - 15.5 %   Platelets 213 150 - 400 K/uL  Basic metabolic panel  (at [facility])     Status: Abnormal   Collection Time: 11/10/14  3:53 PM  Result Value Ref Range   Sodium 140 135 - 145 mmol/L   Potassium 4.1 3.5 - 5.1 mmol/L   Chloride 103 96 - 112 mmol/L   CO2 31 19 - 32 mmol/L   Glucose, Bld 84 70 - 99 mg/dL   BUN 8 6 - 23 mg/dL   Creatinine, Ser 0.71 0.50 - 1.10 mg/dL   Calcium 8.1 (L) 8.4 - 10.5 mg/dL   GFR calc non Af Amer 74 (L) >90 mL/min   GFR calc Af Amer 86 (L) >90 mL/min   Anion gap 6 5 - 15  Protime-INR     Status: Abnormal   Collection Time: 11/10/14  3:53 PM  Result Value Ref Range   Prothrombin Time 24.4 (H) 11.6 - 15.2 seconds   INR 2.18 (H) 0.00 - 1.49  I-Stat Troponin, ED (not at [facility])     Status: None   Collection Time: 11/10/14  4:00 PM  Result Value Ref Range   Troponin i, poc 0.00 0.00 - 0.08 ng/mL   Comment 3          Urinalysis, Routine w reflex microscopic     Status: Abnormal   Collection Time: 11/10/14  9:44 PM  Result Value Ref Range   Color,  Urine YELLOW YELLOW   APPearance CLEAR CLEAR   Specific Gravity, Urine 1.004 (L) 1.005 - 1.030   pH 7.5 5.0 - 8.0   Glucose, UA NEGATIVE NEGATIVE mg/dL   Hgb urine dipstick SMALL (A) NEGATIVE   Bilirubin Urine NEGATIVE NEGATIVE   Ketones, ur NEGATIVE NEGATIVE mg/dL   Protein, ur NEGATIVE NEGATIVE mg/dL   Urobilinogen, UA 0.2 0.0 - 1.0 mg/dL   Nitrite NEGATIVE NEGATIVE   Leukocytes, UA SMALL (A) NEGATIVE  Urine microscopic-add on     Status: None   Collection Time: 11/10/14  9:44 PM  Result Value Ref Range   Squamous Epithelial / LPF RARE RARE   WBC, UA 7-10 <  3 WBC/hpf   RBC / HPF 3-6 <3 RBC/hpf   Bacteria, UA RARE RARE  Sedimentation rate     Status: None   Collection Time: 11/10/14 11:00 PM  Result Value Ref Range   Sed Rate 17 0 - 22 mm/hr  CK total and CKMB (cardiac)     Status: None   Collection Time: 11/11/14 12:51 AM  Result Value Ref Range   Total CK 64 7 - 177 U/L   CK, MB 2.5 0.3 - 4.0 ng/mL   Relative Index RELATIVE INDEX IS INVALID 0.0 - 2.5  Comprehensive metabolic panel     Status: Abnormal   Collection Time: 11/11/14 12:51 AM  Result Value Ref Range   Sodium 138 135 - 145 mmol/L   Potassium 3.7 3.5 - 5.1 mmol/L   Chloride 104 96 - 112 mmol/L   CO2 25 19 - 32 mmol/L   Glucose, Bld 95 70 - 99 mg/dL   BUN 10 6 - 23 mg/dL   Creatinine, Ser 0.66 0.50 - 1.10 mg/dL   Calcium 7.9 (L) 8.4 - 10.5 mg/dL   Total Protein 5.5 (L) 6.0 - 8.3 g/dL   Albumin 3.0 (L) 3.5 - 5.2 g/dL   AST 33 0 - 37 U/L   ALT 31 0 - 35 U/L   Alkaline Phosphatase 59 39 - 117 U/L   Total Bilirubin 0.5 0.3 - 1.2 mg/dL   GFR calc non Af Amer 76 (L) >90 mL/min   GFR calc Af Amer 88 (L) >90 mL/min   Anion gap 9 5 - 15  Magnesium     Status: None   Collection Time: 11/11/14 12:51 AM  Result Value Ref Range   Magnesium 1.9 1.5 - 2.5 mg/dL  Troponin I     Status: None   Collection Time: 11/11/14 12:51 AM  Result Value Ref Range   Troponin I 0.03 <0.031 ng/mL  TSH     Status: None    Collection Time: 11/11/14 12:51 AM  Result Value Ref Range   TSH 4.393 0.350 - 4.500 uIU/mL  CBC     Status: Abnormal   Collection Time: 11/11/14  4:52 AM  Result Value Ref Range   WBC 5.7 4.0 - 10.5 K/uL   RBC 4.41 3.87 - 5.11 MIL/uL   Hemoglobin 13.5 12.0 - 15.0 g/dL   HCT 41.2 36.0 - 46.0 %   MCV 93.4 78.0 - 100.0 fL   MCH 30.6 26.0 - 34.0 pg   MCHC 32.8 30.0 - 36.0 g/dL   RDW 16.9 (H) 11.5 - 15.5 %   Platelets 204 150 - 400 K/uL  Comprehensive metabolic panel     Status: Abnormal   Collection Time: 11/11/14  4:52 AM  Result Value Ref Range   Sodium 139 135 - 145 mmol/L   Potassium 3.8 3.5 - 5.1 mmol/L   Chloride 104 96 - 112 mmol/L   CO2 29 19 - 32 mmol/L   Glucose, Bld 94 70 - 99 mg/dL   BUN 10 6 - 23 mg/dL   Creatinine, Ser 0.67 0.50 - 1.10 mg/dL   Calcium 7.7 (L) 8.4 - 10.5 mg/dL   Total Protein 6.0 6.0 - 8.3 g/dL   Albumin 3.1 (L) 3.5 - 5.2 g/dL   AST 33 0 - 37 U/L   ALT 32 0 - 35 U/L   Alkaline Phosphatase 63 39 - 117 U/L   Total Bilirubin 0.8 0.3 - 1.2 mg/dL   GFR calc non Af Amer 76 (L) >  90 mL/min   GFR calc Af Amer 88 (L) >90 mL/min   Anion gap 6 5 - 15  Troponin I (q 6hr x 3)     Status: None   Collection Time: 11/11/14  4:52 AM  Result Value Ref Range   Troponin I <0.03 <0.031 ng/mL   CK total and CKMB (cardiac)     Status: None   Collection Time: 11/11/14  4:52 AM  Result Value Ref Range   Total CK 65 7 - 177 U/L   CK, MB 1.9 0.3 - 4.0 ng/mL   Relative Index RELATIVE INDEX IS INVALID 0.0 - 2.5  Protime-INR     Status: Abnormal   Collection Time: 11/11/14  4:52 AM  Result Value Ref Range   Prothrombin Time 22.3 (H) 11.6 - 15.2 seconds   INR 1.93 (H) 0.00 - 1.49   Dg Chest 2 View  11/10/2014   CLINICAL DATA:  81 year old female with 2 days of weakness. Chest and jaw pain. Initial encounter.  EXAM: CHEST  2 VIEW  COMPARISON:  09/05/2014 and earlier.  FINDINGS: Stable cardiac size and mediastinal contours. Stable lung volumes. No pneumothorax, pulmonary  edema, pleural effusion or acute pulmonary opacity. Stable mild increased interstitial markings. Osteopenia. No acute osseous abnormality identified. Stable cholecystectomy clips. Stable thyroid bed surgical clips.  IMPRESSION: No acute cardiopulmonary abnormality.   Electronically Signed   By: Genevie Ann M.D.   On: 11/10/2014 16:29   Ct Head Wo Contrast  11/11/2014   CLINICAL DATA:  Acute onset of occipital headache and dizziness. Generalized weakness. Initial encounter.  EXAM: CT HEAD WITHOUT CONTRAST  TECHNIQUE: Contiguous axial images were obtained from the base of the skull through the vertex without intravenous contrast.  COMPARISON:  CT of the head performed 05/28/2012  FINDINGS: There is no evidence of acute infarction, mass lesion, or intra- or extra-axial hemorrhage on CT.  Prominence of the ventricles and sulci reflects mild to moderate cortical volume loss. Mild cerebellar atrophy is noted. Scattered periventricular and subcortical white matter change likely reflects small vessel ischemic microangiopathy.  The brainstem and fourth ventricle are within normal limits. The basal ganglia are unremarkable in appearance. The cerebral hemispheres demonstrate grossly normal gray-white differentiation. No mass effect or midline shift is seen.  There is no evidence of fracture; visualized osseous structures are unremarkable in appearance. The orbits are within normal limits. A mucus retention cyst or polyp is noted at the right maxillary sinus. The remaining paranasal sinuses and mastoid air cells are well-aerated. No significant soft tissue abnormalities are seen.  IMPRESSION: 1. No acute intracranial pathology seen on CT. 2. Mild to moderate cortical volume loss and scattered small vessel ischemic microangiopathy. 3. Mucus retention cyst or polyp at the right maxillary sinus.   Electronically Signed   By: Garald Balding M.D.   On: 11/11/2014 01:24     ASSESSMENT:  Kristen Whitehead is an 81 yo who was admitted for  weakness, chest pain   Had "spell" on Tues of weakness associated with chest /jaw discomfort.  This eased on own.  No CP since   On exam, HR have been good   BP a little low yesterday  Exam otherwise unremarkable  Labs show troponin negative.  I am suspicious that BP was low in [place] and then symptoms developed.   She denies palpitations to sugg rapid HR. I am not convinced of active ischemia.    Patient tired now but I would recomm ambulating later this AM  Follow  BP, HR and symptoms.   Check orthostatics    If no further symptoms I would d/c with close outpatient f/u  2. Afib  Watch rates.  I would continue anticoaguation.  3.  HTN  Check orthostatics.

## 2014-11-11 NOTE — Progress Notes (Signed)
ANTICOAGULATION CONSULT NOTE - Follow Up Consult  Pharmacy Consult for coumadin Indication: atrial fibrillation  Allergies  Allergen Reactions  . Azithromycin   . Compazine [Prochlorperazine Edisylate]   . Demerol   . Dolophine [Methadone]   . Ebastine     EBS  . Erythromycin   . Ibuprofen   . Statins     myalgias  . Tetanus Toxoids   . Tetracyclines & Related     Patient Measurements: Height: 5' (152.4 cm) Weight: 140 lb 12.8 oz (63.866 kg) IBW/kg (Calculated) : 45.5   Vital Signs: Temp: 98.7 F (37.1 C) (03/25 0554) Temp Source: Oral (03/25 0554) BP: 140/60 mmHg (03/25 0554) Pulse Rate: 60 (03/25 0554)  Labs:  Recent Labs  11/10/14 1553 11/11/14 0051 11/11/14 0452  HGB 14.4  --  13.5  HCT 43.4  --  41.2  PLT 213  --  204  LABPROT 24.4*  --  22.3*  INR 2.18*  --  1.93*  CREATININE 0.71 0.66 0.67  CKTOTAL  --  64 65  CKMB  --  2.5 1.9  TROPONINI  --  0.03 <0.03    Estimated Creatinine Clearance: 39.8 mL/min (by C-G formula based on Cr of 0.67).   Medications:  Scheduled:  . amLODipine  2.5 mg Oral Daily  . atenolol  25 mg Oral TID  . beta carotene w/minerals  1 tablet Oral Daily  . calcium carbonate  1 tablet Oral BID WC  . cholecalciferol  2,000 Units Oral Daily  . cycloSPORINE  1 drop Both Eyes BID  . [START ON 11/14/2014] estradiol  2 g Vaginal Weekly  . furosemide  20 mg Oral BID  . isosorbide mononitrate  30 mg Oral Daily  . latanoprost  1 drop Left Eye QHS  . levothyroxine  75 mcg Oral QAC breakfast  . potassium chloride  20 mEq Oral Daily  . pramipexole  0.125 mg Oral QPM  . sodium chloride  3 mL Intravenous Q12H  . sodium chloride  3 mL Intravenous Q12H  . temazepam  15 mg Oral QHS  . vitamin C  500 mg Oral Daily  . Warfarin - Pharmacist Dosing Inpatient   Does not apply q1800    Assessment: 81 yo female w/ CP on coumadin PTA for afib and pharmacy has been consulted to dose.  INR today is 1.93.  Home coumadin regimen reported as  2.5 mg Wednesday, Sunday; 5 mg other days  Goal of Therapy:  INR 2-3 Monitor platelets by anticoagulation protocol: Yes   Plan:  -Coumadin 5mg  po today -Daily PT/INR  Hildred Laser, Pharm D 11/11/2014 10:00 AM   e

## 2014-11-11 NOTE — Progress Notes (Signed)
  Echocardiogram 2D Echocardiogram has been performed.  Donata Clay 11/11/2014, 4:44 PM

## 2014-11-14 DIAGNOSIS — Z85850 Personal history of malignant neoplasm of thyroid: Secondary | ICD-10-CM | POA: Diagnosis not present

## 2014-11-14 DIAGNOSIS — Z7901 Long term (current) use of anticoagulants: Secondary | ICD-10-CM | POA: Diagnosis not present

## 2014-11-14 DIAGNOSIS — R531 Weakness: Secondary | ICD-10-CM | POA: Diagnosis not present

## 2014-11-14 DIAGNOSIS — E039 Hypothyroidism, unspecified: Secondary | ICD-10-CM | POA: Diagnosis not present

## 2014-11-14 DIAGNOSIS — I5032 Chronic diastolic (congestive) heart failure: Secondary | ICD-10-CM | POA: Diagnosis not present

## 2014-11-14 DIAGNOSIS — I4891 Unspecified atrial fibrillation: Secondary | ICD-10-CM | POA: Diagnosis not present

## 2014-11-14 DIAGNOSIS — I341 Nonrheumatic mitral (valve) prolapse: Secondary | ICD-10-CM | POA: Diagnosis not present

## 2014-11-17 ENCOUNTER — Other Ambulatory Visit: Payer: Self-pay | Admitting: Cardiology

## 2014-11-17 ENCOUNTER — Ambulatory Visit (INDEPENDENT_AMBULATORY_CARE_PROVIDER_SITE_OTHER): Payer: Commercial Managed Care - HMO | Admitting: Pharmacist

## 2014-11-17 DIAGNOSIS — I4891 Unspecified atrial fibrillation: Secondary | ICD-10-CM | POA: Diagnosis not present

## 2014-11-17 DIAGNOSIS — Z85850 Personal history of malignant neoplasm of thyroid: Secondary | ICD-10-CM | POA: Diagnosis not present

## 2014-11-17 DIAGNOSIS — I341 Nonrheumatic mitral (valve) prolapse: Secondary | ICD-10-CM | POA: Diagnosis not present

## 2014-11-17 DIAGNOSIS — Z7901 Long term (current) use of anticoagulants: Secondary | ICD-10-CM | POA: Diagnosis not present

## 2014-11-17 DIAGNOSIS — I5032 Chronic diastolic (congestive) heart failure: Secondary | ICD-10-CM | POA: Diagnosis not present

## 2014-11-17 DIAGNOSIS — R531 Weakness: Secondary | ICD-10-CM | POA: Diagnosis not present

## 2014-11-17 DIAGNOSIS — E039 Hypothyroidism, unspecified: Secondary | ICD-10-CM | POA: Diagnosis not present

## 2014-11-17 DIAGNOSIS — Z5181 Encounter for therapeutic drug level monitoring: Secondary | ICD-10-CM

## 2014-11-17 LAB — POCT INR: INR: 1.3

## 2014-11-22 ENCOUNTER — Ambulatory Visit (INDEPENDENT_AMBULATORY_CARE_PROVIDER_SITE_OTHER): Payer: Commercial Managed Care - HMO | Admitting: *Deleted

## 2014-11-22 ENCOUNTER — Encounter: Payer: Self-pay | Admitting: Physician Assistant

## 2014-11-22 ENCOUNTER — Ambulatory Visit (INDEPENDENT_AMBULATORY_CARE_PROVIDER_SITE_OTHER): Payer: Commercial Managed Care - HMO | Admitting: Physician Assistant

## 2014-11-22 VITALS — BP 122/66 | HR 73 | Ht 60.0 in | Wt 142.0 lb

## 2014-11-22 DIAGNOSIS — I951 Orthostatic hypotension: Secondary | ICD-10-CM | POA: Diagnosis not present

## 2014-11-22 DIAGNOSIS — I5032 Chronic diastolic (congestive) heart failure: Secondary | ICD-10-CM

## 2014-11-22 DIAGNOSIS — R079 Chest pain, unspecified: Secondary | ICD-10-CM | POA: Diagnosis not present

## 2014-11-22 DIAGNOSIS — I482 Chronic atrial fibrillation, unspecified: Secondary | ICD-10-CM

## 2014-11-22 DIAGNOSIS — H40053 Ocular hypertension, bilateral: Secondary | ICD-10-CM | POA: Diagnosis not present

## 2014-11-22 DIAGNOSIS — I4891 Unspecified atrial fibrillation: Secondary | ICD-10-CM | POA: Diagnosis not present

## 2014-11-22 DIAGNOSIS — I119 Hypertensive heart disease without heart failure: Secondary | ICD-10-CM

## 2014-11-22 DIAGNOSIS — H35342 Macular cyst, hole, or pseudohole, left eye: Secondary | ICD-10-CM | POA: Diagnosis not present

## 2014-11-22 DIAGNOSIS — Z7901 Long term (current) use of anticoagulants: Secondary | ICD-10-CM | POA: Diagnosis not present

## 2014-11-22 DIAGNOSIS — Z5181 Encounter for therapeutic drug level monitoring: Secondary | ICD-10-CM

## 2014-11-22 DIAGNOSIS — I340 Nonrheumatic mitral (valve) insufficiency: Secondary | ICD-10-CM

## 2014-11-22 DIAGNOSIS — H40013 Open angle with borderline findings, low risk, bilateral: Secondary | ICD-10-CM | POA: Diagnosis not present

## 2014-11-22 DIAGNOSIS — H5200 Hypermetropia, unspecified eye: Secondary | ICD-10-CM | POA: Diagnosis not present

## 2014-11-22 LAB — POCT INR: INR: 2.1

## 2014-11-22 NOTE — Progress Notes (Signed)
Cardiology Office Note   Date:  11/22/2014   ID:  Kristen Whitehead, DOB Apr 26, 1923, MRN [id number]  PCP:  Kristen Pac, MD  Cardiologist:  Dr. Darlin Whitehead     Chief Complaint  Patient presents with  . Hospitalization Follow-up    Admit 3/24-3/25 with chest pain, low BP  . Atrial Fibrillation  . Congestive Heart Failure     History of Present Illness: Kristen Whitehead is a 81 y.o. female with a hx of chronic AFib, mitral regurgitation, aortic insufficiency, HTN, diastolic HF, HL. Last seen by Dr. Mare Whitehead 07/2014.  Admitted 3/24-3/25 with weakness and jaw and chest pain (occurred 4 days prior to presentation). Cardiac enzymes remained negative. Echo demonstrated normal LVF, mild BAE, mild AI and mild MR.  Orthostatic vital signs were positive. Amlodipine was discontinued. Compression stockings were recommended. Close follow-up was recommended.  She returns for follow-up.  After discharge, she ate out at [employer]. That night she had nausea and vomiting 1. She felt much better after this. Since that time, she denies any further chest pain, dizziness, syncope, orthopnea, PND or edema. Dyspnea with exertion remains stable. She is NYHA 2b. She has been wearing compression stockings. She's had some visual disturbances recently. She has significant ophthalmologic issues including retinal detachment, glaucoma and macular degeneration. She sees her ophthalmologist later today. Overall, she has noted increased incidences of indigestion.   Studies/Reports Reviewed Today:  Echo 11/11/14 - EF 60% to 65%. - Aortic valve: There was mild regurgitation. - Mitral valve: Calcified annulus. Mildly thickened leaflets.  There was mild regurgitation. - Left atrium: The atrium was mildly dilated. - Right atrium: The atrium was mildly dilated.  Nuclear 03/18/12 Normal stress nuclear study.  LV Ejection Fraction: 75%. LV Wall Motion: NL LV Function; NL Wall Motion   Past Medical  History  Diagnosis Date  . History of mitral valve prolapse 06/15/2008    a. echo 1/14: mild LVH, EF 60-65%, mild MR, mild to mod BAE, PASP 35  . Chronic atrial fibrillation     managed with rate control and coumadin  . History of congenital mitral regurgitation     mild  . Hypothyroidism   . Hypercholesterolemia   . GERD (gastroesophageal reflux disease)   . Chest pain     no known ischemic heart disease; negative Myoview July 2013. EF 75% with no ischemia.   . Hypertension   . Bladder prolapse, female, acquired   . Chronic diastolic heart failure   . Chronic anticoagulation   . Heart disease   . Cancer     thyroid    Past Surgical History  Procedure Laterality Date  . Thyroidectomy    . Cholecystectomy    . Abdominal hysterectomy    . Cataract extraction       Current Outpatient Prescriptions  Medication Sig Dispense Refill  . atenolol (TENORMIN) 25 MG tablet TAKE 1 TABLET  TWO  TO THREE TIMES DAILY AS DIRECTED (Patient taking differently: tid) 260 tablet 1  . beta carotene w/minerals (OCUVITE) tablet Take 1 tablet by mouth daily.    . calcium carbonate (OS-CAL - DOSED IN MG OF ELEMENTAL CALCIUM) 1250 MG tablet Take 1 tablet by mouth 2 (two) times daily.     . Cholecalciferol (VITAMIN D) 2000 UNITS tablet Take 2,000 Units by mouth daily.     Marland Kitchen estradiol (ESTRACE VAGINAL) 0.1 MG/GM vaginal cream Place 2 g vaginally once a week.     . fluticasone (FLONASE) 50 MCG/ACT nasal  spray Place 1 spray into both nostrils as needed for allergies.     . furosemide (LASIX) 20 MG tablet Take 2 tablets in the morning and 1 tablet in the afternnon 90 tablet 3  . isosorbide mononitrate (IMDUR) 30 MG 24 hr tablet TAKE 1 TABLET EVERY DAY 90 tablet 1  . latanoprost (XALATAN) 0.005 % ophthalmic solution Place 1 drop into the left eye at bedtime.     Marland Kitchen levothyroxine (SYNTHROID, LEVOTHROID) 75 MCG tablet Take 75 mcg by mouth daily.      Marland Kitchen levothyroxine (SYNTHROID, LEVOTHROID) 88 MCG tablet     .  levothyroxine (SYNTHROID, LEVOTHROID) 88 MCG tablet Take 88 mcg by mouth daily before breakfast.    . NITROSTAT 0.4 MG SL tablet DISSOLVE 1 TABLET UNDER THE TONGUE EVERY 5 MINUTES FOR 3 DOSES AS NEEDED  FOR  CHEST  PAIN 25 tablet 5  . potassium chloride (K-DUR) 10 MEQ tablet Take 1 tablet (10 mEq total) by mouth 2 (two) times daily. (Patient taking differently: Take 20 mEq by mouth daily. ) 60 tablet 2  . pramipexole (MIRAPEX) 0.125 MG tablet Take 1 tablet (0.125 mg total) by mouth every evening. 90 tablet 3  . RESTASIS 0.05 % ophthalmic emulsion Place 1 drop into both eyes 2 (two) times daily.     . temazepam (RESTORIL) 15 MG capsule Take 15 mg by mouth at bedtime.    . vitamin C (ASCORBIC ACID) 500 MG tablet Take 500 mg by mouth daily.    Marland Kitchen warfarin (COUMADIN) 5 MG tablet TAKE AS DIRECTED BY ANTICOAGULATION CLINIC (Patient taking differently: TAKE AS DIRECTED BY ANTICOAGULATION CLINIC. Monday, Wednesday and Friday take 2.5 mg. Sun, Tues, Thu, and Sat take 5mg ) 90 tablet 1   No current facility-administered medications for this visit.    Allergies:   Azithromycin; Compazine; Demerol; Dolophine; Ebastine; Erythromycin; Ibuprofen; Statins; Tetanus toxoids; and Tetracyclines & related    Social History:  The patient  reports that she has never smoked. She has never used smokeless tobacco. She reports that she does not drink alcohol or use illicit drugs.   Family History:  The patient's family history includes Stroke in her father and mother. There is no history of Heart attack or Heart disease.    ROS:   Please see the history of present illness.   Review of Systems  Constitution: Positive for chills.  Eyes: Positive for visual disturbance.  Cardiovascular: Positive for chest pain, dyspnea on exertion, irregular heartbeat, leg swelling and orthopnea.  Respiratory: Positive for cough.   Musculoskeletal: Positive for back pain, joint pain and myalgias.  Gastrointestinal: Positive for  diarrhea, nausea and vomiting.  Neurological: Positive for loss of balance.  All other systems reviewed and are negative.    PHYSICAL EXAM: VS:  BP 122/66 mmHg  Pulse 73  Ht 5' (1.524 m)  Wt 142 lb (64.411 kg)  BMI 27.73 kg/m2    Wt Readings from Last 3 Encounters:  11/22/14 142 lb (64.411 kg)  11/10/14 140 lb 12.8 oz (63.866 kg)  08/08/14 142 lb (64.411 kg)     GEN: Well nourished, well developed, in no acute distress HEENT: normal Neck: no JVD, no masses Cardiac:  Normal S1/S2, irregularly irregular rhythm; no murmur ,  no rubs or gallops, no edema  Respiratory:  clear to auscultation bilaterally, no wheezing, rhonchi or rales. GI: soft, nontender, nondistended, + BS MS: no deformity or atrophy Skin: warm and dry  Neuro:  CNs II-XII intact, Strength and sensation are  intact Psych: Normal affect   EKG:  EKG is ordered today.  It demonstrates:   Atrial fibrillation, HR 73, no change from prior tracing dated 08/08/14   Recent Labs: 08/01/2014: Pro B Natriuretic peptide (BNP) 2372.0* 11/11/2014: ALT 32; BUN 10; Creatinine 0.67; Hemoglobin 13.5; Magnesium 1.9; Platelets 204; Potassium 3.8; Sodium 139; TSH 4.393    Lipid Panel    Component Value Date/Time   CHOL 211* 11/04/2012 1524   TRIG 201.0* 11/04/2012 1524   HDL 66.80 11/04/2012 1524   CHOLHDL 3 11/04/2012 1524   VLDL 40.2* 11/04/2012 1524   LDLCALC 109* 04/16/2012 1043   LDLDIRECT 98.8 11/04/2012 1524      ASSESSMENT AND PLAN:  Postural hypotension This is resolved. She remains off of amlodipine. Continue wearing compression stockings.  Chest pain, unspecified chest pain type This is resolved. With her increased indigestion, I suspect her chest pain is related to GI etiology. I have asked her to take Pepcid as needed.  Chronic atrial fibrillation Rate remains controlled. Continue Coumadin. Follow-up INR today  Benign hypertensive heart disease without heart failure Blood pressure remains  controlled.  Chronic diastolic heart failure Volume is stable.  Mitral regurgitation  Recent echocardiogram with mild mitral regurgitation and normal ejection fraction.   Current medicines are reviewed at length with the patient today.  The patient does not have concerns regarding medicines.  The following changes have been made:    Pepcid 20 mg (OTC) Twice daily as needed.   Labs/ tests ordered today include:   Orders Placed This Encounter  Procedures  . EKG 12-Lead    Disposition:   FU with Dr. Mare Whitehead in June as planned.   Signed, Versie Starks, MHS 11/22/2014 10:59 AM    [facility] [address], [place], [place]  [address] Phone: [phone]; Fax: [phone]

## 2014-11-22 NOTE — Patient Instructions (Signed)
START OTC PEPCID 20 MG 1 CAP TWICE DAILY AS NEEDED  FOLLOW UP WITH [name] IN 01/2015

## 2014-11-25 DIAGNOSIS — Z7901 Long term (current) use of anticoagulants: Secondary | ICD-10-CM | POA: Diagnosis not present

## 2014-11-25 DIAGNOSIS — R531 Weakness: Secondary | ICD-10-CM | POA: Diagnosis not present

## 2014-11-25 DIAGNOSIS — I341 Nonrheumatic mitral (valve) prolapse: Secondary | ICD-10-CM | POA: Diagnosis not present

## 2014-11-25 DIAGNOSIS — Z85850 Personal history of malignant neoplasm of thyroid: Secondary | ICD-10-CM | POA: Diagnosis not present

## 2014-11-25 DIAGNOSIS — I4891 Unspecified atrial fibrillation: Secondary | ICD-10-CM | POA: Diagnosis not present

## 2014-11-25 DIAGNOSIS — E039 Hypothyroidism, unspecified: Secondary | ICD-10-CM | POA: Diagnosis not present

## 2014-11-25 DIAGNOSIS — I5032 Chronic diastolic (congestive) heart failure: Secondary | ICD-10-CM | POA: Diagnosis not present

## 2014-11-30 DIAGNOSIS — M79673 Pain in unspecified foot: Secondary | ICD-10-CM | POA: Diagnosis not present

## 2014-11-30 DIAGNOSIS — L989 Disorder of the skin and subcutaneous tissue, unspecified: Secondary | ICD-10-CM | POA: Diagnosis not present

## 2014-11-30 DIAGNOSIS — I4891 Unspecified atrial fibrillation: Secondary | ICD-10-CM | POA: Diagnosis not present

## 2014-11-30 DIAGNOSIS — I509 Heart failure, unspecified: Secondary | ICD-10-CM | POA: Diagnosis not present

## 2014-11-30 DIAGNOSIS — I10 Essential (primary) hypertension: Secondary | ICD-10-CM | POA: Diagnosis not present

## 2014-12-01 DIAGNOSIS — Z7901 Long term (current) use of anticoagulants: Secondary | ICD-10-CM | POA: Diagnosis not present

## 2014-12-01 DIAGNOSIS — E039 Hypothyroidism, unspecified: Secondary | ICD-10-CM | POA: Diagnosis not present

## 2014-12-01 DIAGNOSIS — Z85850 Personal history of malignant neoplasm of thyroid: Secondary | ICD-10-CM | POA: Diagnosis not present

## 2014-12-01 DIAGNOSIS — I341 Nonrheumatic mitral (valve) prolapse: Secondary | ICD-10-CM | POA: Diagnosis not present

## 2014-12-01 DIAGNOSIS — I4891 Unspecified atrial fibrillation: Secondary | ICD-10-CM | POA: Diagnosis not present

## 2014-12-01 DIAGNOSIS — R531 Weakness: Secondary | ICD-10-CM | POA: Diagnosis not present

## 2014-12-01 DIAGNOSIS — I5032 Chronic diastolic (congestive) heart failure: Secondary | ICD-10-CM | POA: Diagnosis not present

## 2014-12-02 DIAGNOSIS — E039 Hypothyroidism, unspecified: Secondary | ICD-10-CM | POA: Diagnosis not present

## 2014-12-02 DIAGNOSIS — I4891 Unspecified atrial fibrillation: Secondary | ICD-10-CM | POA: Diagnosis not present

## 2014-12-02 DIAGNOSIS — Z85850 Personal history of malignant neoplasm of thyroid: Secondary | ICD-10-CM | POA: Diagnosis not present

## 2014-12-02 DIAGNOSIS — I5032 Chronic diastolic (congestive) heart failure: Secondary | ICD-10-CM | POA: Diagnosis not present

## 2014-12-02 DIAGNOSIS — I341 Nonrheumatic mitral (valve) prolapse: Secondary | ICD-10-CM | POA: Diagnosis not present

## 2014-12-02 DIAGNOSIS — Z7901 Long term (current) use of anticoagulants: Secondary | ICD-10-CM | POA: Diagnosis not present

## 2014-12-02 DIAGNOSIS — R531 Weakness: Secondary | ICD-10-CM | POA: Diagnosis not present

## 2014-12-05 DIAGNOSIS — I341 Nonrheumatic mitral (valve) prolapse: Secondary | ICD-10-CM | POA: Diagnosis not present

## 2014-12-05 DIAGNOSIS — I5032 Chronic diastolic (congestive) heart failure: Secondary | ICD-10-CM | POA: Diagnosis not present

## 2014-12-05 DIAGNOSIS — E039 Hypothyroidism, unspecified: Secondary | ICD-10-CM | POA: Diagnosis not present

## 2014-12-05 DIAGNOSIS — Z85850 Personal history of malignant neoplasm of thyroid: Secondary | ICD-10-CM | POA: Diagnosis not present

## 2014-12-05 DIAGNOSIS — R531 Weakness: Secondary | ICD-10-CM | POA: Diagnosis not present

## 2014-12-05 DIAGNOSIS — Z7901 Long term (current) use of anticoagulants: Secondary | ICD-10-CM | POA: Diagnosis not present

## 2014-12-05 DIAGNOSIS — I4891 Unspecified atrial fibrillation: Secondary | ICD-10-CM | POA: Diagnosis not present

## 2014-12-08 ENCOUNTER — Ambulatory Visit (INDEPENDENT_AMBULATORY_CARE_PROVIDER_SITE_OTHER): Payer: Commercial Managed Care - HMO | Admitting: Internal Medicine

## 2014-12-08 DIAGNOSIS — I341 Nonrheumatic mitral (valve) prolapse: Secondary | ICD-10-CM | POA: Diagnosis not present

## 2014-12-08 DIAGNOSIS — R531 Weakness: Secondary | ICD-10-CM | POA: Diagnosis not present

## 2014-12-08 DIAGNOSIS — I4891 Unspecified atrial fibrillation: Secondary | ICD-10-CM | POA: Diagnosis not present

## 2014-12-08 DIAGNOSIS — Z5181 Encounter for therapeutic drug level monitoring: Secondary | ICD-10-CM

## 2014-12-08 DIAGNOSIS — I482 Chronic atrial fibrillation, unspecified: Secondary | ICD-10-CM

## 2014-12-08 DIAGNOSIS — E039 Hypothyroidism, unspecified: Secondary | ICD-10-CM | POA: Diagnosis not present

## 2014-12-08 DIAGNOSIS — Z85850 Personal history of malignant neoplasm of thyroid: Secondary | ICD-10-CM | POA: Diagnosis not present

## 2014-12-08 DIAGNOSIS — Z7901 Long term (current) use of anticoagulants: Secondary | ICD-10-CM | POA: Diagnosis not present

## 2014-12-08 DIAGNOSIS — I5032 Chronic diastolic (congestive) heart failure: Secondary | ICD-10-CM | POA: Diagnosis not present

## 2014-12-08 LAB — POCT INR: INR: 1.6

## 2014-12-09 ENCOUNTER — Ambulatory Visit (INDEPENDENT_AMBULATORY_CARE_PROVIDER_SITE_OTHER): Payer: Commercial Managed Care - HMO | Admitting: Podiatrist

## 2014-12-09 ENCOUNTER — Encounter: Payer: Self-pay | Admitting: Podiatrist

## 2014-12-09 DIAGNOSIS — I4891 Unspecified atrial fibrillation: Secondary | ICD-10-CM | POA: Diagnosis not present

## 2014-12-09 DIAGNOSIS — I341 Nonrheumatic mitral (valve) prolapse: Secondary | ICD-10-CM | POA: Diagnosis not present

## 2014-12-09 DIAGNOSIS — Z7901 Long term (current) use of anticoagulants: Secondary | ICD-10-CM | POA: Diagnosis not present

## 2014-12-09 DIAGNOSIS — L603 Nail dystrophy: Secondary | ICD-10-CM

## 2014-12-09 DIAGNOSIS — R531 Weakness: Secondary | ICD-10-CM | POA: Diagnosis not present

## 2014-12-09 DIAGNOSIS — I5032 Chronic diastolic (congestive) heart failure: Secondary | ICD-10-CM | POA: Diagnosis not present

## 2014-12-09 DIAGNOSIS — E039 Hypothyroidism, unspecified: Secondary | ICD-10-CM | POA: Diagnosis not present

## 2014-12-09 DIAGNOSIS — L308 Other specified dermatitis: Secondary | ICD-10-CM | POA: Diagnosis not present

## 2014-12-09 DIAGNOSIS — M79673 Pain in unspecified foot: Secondary | ICD-10-CM

## 2014-12-09 DIAGNOSIS — G629 Polyneuropathy, unspecified: Secondary | ICD-10-CM

## 2014-12-09 DIAGNOSIS — L84 Corns and callosities: Secondary | ICD-10-CM

## 2014-12-09 DIAGNOSIS — Z85850 Personal history of malignant neoplasm of thyroid: Secondary | ICD-10-CM | POA: Diagnosis not present

## 2014-12-09 DIAGNOSIS — M2040 Other hammer toe(s) (acquired), unspecified foot: Secondary | ICD-10-CM

## 2014-12-09 NOTE — Progress Notes (Signed)
Subjective: Patient presents today for follow up of foot and nail care.  She would like her toenails trimmed, she has corns on her fifth toes bilateral, she has a thick right great toe, she would like an arch support like the kind she received from Dr. Fritzi Mandes when she was her patient before she changed insurance of which she states Medicare paid for 1 pair per year. She brings in the inserts at today's visit and they appear to be an off the shelf accomidative insert.  She is not diabetic. I rx'd gabapentin at the last visit and she states she did not take it because her primary care doctor told her not to.  She continue to complain of burning to her feet.    Objective: Vascular examination is intact with palpable pedal pulses DP and PT bilateral. Neurological sensation is also intact both epicriticaly and protectively. She has a prominent plantarflexed metatarsals with fat pad atrophy noted at the metatarsal heads. Mild flexible contracture of the lesser digits is also present. Right hallux has a mild dorsal contracture from the extensor hallucis longus tendon. The right hallux nail is thickened and dystrophic. The remainder of the nails are elongated and uncomfortable. Slight hammertoe contracture of bilateral fifth digits is noted with overlying callus is present. High arched foot type is present bilateral.  Assessment: Symptomatic toenails x10, onychodystrophy right great toenail, hammertoe fifth digits bilateral with associated corns, high arched foot type, contracture of lesser digits, neuropathy  Plan: Discussed treatment options and alternatives Toenails are debrided today without complication. Corns are also debrided today without complication. Discussed that the inserts she is after are not covered under medicare. she will have to see Dr. Fritzi Mandes again if she would like this insert.Threasa Beards also explained why the inserts are  Not covered as she is not a diabetic at check out.  She will be seen back  for routine care in 3 months and will call of problems arise in the meantime

## 2014-12-09 NOTE — Patient Instructions (Signed)

## 2014-12-13 ENCOUNTER — Telehealth: Payer: Self-pay | Admitting: Cardiology

## 2014-12-13 MED ORDER — LOSARTAN POTASSIUM 25 MG PO TABS: 25.0000 mg | ORAL_TABLET | Freq: Every day | ORAL | Status: DC

## 2014-12-13 NOTE — Telephone Encounter (Signed)
Patient had not been taking her Isosorbide since d/c from the hospital  Advised patient to go back on per  [name], verbalized understanding

## 2014-12-13 NOTE — Telephone Encounter (Signed)
[place] calling stating that pt's bp is increasing, was 168-103 yesterday and is now 174/101 today. Pt has no headache, no sob, and no chest pain or any other symptoms. Wanting to know if [name] wants to make any changes to pt's medications or if he wants her to come in for an office visit. Please call back and advise.

## 2014-12-13 NOTE — Telephone Encounter (Signed)
Spoke with patient and advised of change

## 2014-12-13 NOTE — Telephone Encounter (Signed)
New Message  Pt c/o BP issue: STAT if pt c/o blurred vision, one-sided weakness or slurred speech  1. What are your last 5 BP readings?158/86, 161/93, 168/95, 174/101  2. Are you having any other symptoms (ex. Dizziness, headache, blurred vision, passed out)? No   3. What is your BP issue? Keeps going up  Patient states that the doctor took her off of her B/P medication and thinks that this is the reason her BP is high.

## 2014-12-13 NOTE — Telephone Encounter (Signed)
Start losartan 25 mg one daily.  Recheck a BMET 7-10 days after starting.

## 2014-12-13 NOTE — Telephone Encounter (Signed)
Spoke with Nira Conn and patient has been checking her blood pressure at home and gradually going up Will forward to  [name] for review

## 2014-12-14 DIAGNOSIS — I4891 Unspecified atrial fibrillation: Secondary | ICD-10-CM | POA: Diagnosis not present

## 2014-12-14 DIAGNOSIS — R531 Weakness: Secondary | ICD-10-CM | POA: Diagnosis not present

## 2014-12-14 DIAGNOSIS — Z7901 Long term (current) use of anticoagulants: Secondary | ICD-10-CM | POA: Diagnosis not present

## 2014-12-14 DIAGNOSIS — I341 Nonrheumatic mitral (valve) prolapse: Secondary | ICD-10-CM | POA: Diagnosis not present

## 2014-12-14 DIAGNOSIS — E039 Hypothyroidism, unspecified: Secondary | ICD-10-CM | POA: Diagnosis not present

## 2014-12-14 DIAGNOSIS — I5032 Chronic diastolic (congestive) heart failure: Secondary | ICD-10-CM | POA: Diagnosis not present

## 2014-12-14 DIAGNOSIS — Z85850 Personal history of malignant neoplasm of thyroid: Secondary | ICD-10-CM | POA: Diagnosis not present

## 2014-12-15 ENCOUNTER — Telehealth: Payer: Self-pay | Admitting: Cardiology

## 2014-12-15 DIAGNOSIS — H40053 Ocular hypertension, bilateral: Secondary | ICD-10-CM | POA: Diagnosis not present

## 2014-12-15 DIAGNOSIS — H40013 Open angle with borderline findings, low risk, bilateral: Secondary | ICD-10-CM | POA: Diagnosis not present

## 2014-12-15 NOTE — Telephone Encounter (Signed)
Pt c/o BP issue: STAT if pt c/o blurred vision, one-sided weakness or slurred speech  1. What are your last 5 BP readings? *107/67  2. Are you having any other symptoms (ex. Dizziness, headache, blurred vision, passed out)? No  3. What is your BP issue? Pt is tired

## 2014-12-15 NOTE — Telephone Encounter (Signed)
Spoke with patient and she stated she never took Losartan Patient checked her blood pressure with her home monitor and blood pressure ok Home health nurse came out next day and checked blood pressure with both her machine and patients, patients was accurate Per patient the equipment supplied to her to do daily blood pressure checks was not accurate   [name] aware

## 2014-12-16 ENCOUNTER — Other Ambulatory Visit: Payer: Self-pay | Admitting: Cardiology

## 2014-12-16 NOTE — Telephone Encounter (Signed)
Patient d/c from hospital still taking with Atenolol, confirmed with patient

## 2014-12-16 NOTE — Telephone Encounter (Signed)
Is the patient still to be taking this? Please advise. Thanks, MI

## 2014-12-21 DIAGNOSIS — N8182 Incompetence or weakening of pubocervical tissue: Secondary | ICD-10-CM | POA: Diagnosis not present

## 2014-12-21 DIAGNOSIS — R531 Weakness: Secondary | ICD-10-CM | POA: Diagnosis not present

## 2014-12-21 DIAGNOSIS — E039 Hypothyroidism, unspecified: Secondary | ICD-10-CM | POA: Diagnosis not present

## 2014-12-21 DIAGNOSIS — I341 Nonrheumatic mitral (valve) prolapse: Secondary | ICD-10-CM | POA: Diagnosis not present

## 2014-12-21 DIAGNOSIS — Z7901 Long term (current) use of anticoagulants: Secondary | ICD-10-CM | POA: Diagnosis not present

## 2014-12-21 DIAGNOSIS — Z85850 Personal history of malignant neoplasm of thyroid: Secondary | ICD-10-CM | POA: Diagnosis not present

## 2014-12-21 DIAGNOSIS — I5032 Chronic diastolic (congestive) heart failure: Secondary | ICD-10-CM | POA: Diagnosis not present

## 2014-12-21 DIAGNOSIS — I4891 Unspecified atrial fibrillation: Secondary | ICD-10-CM | POA: Diagnosis not present

## 2014-12-22 ENCOUNTER — Telehealth: Payer: Self-pay | Admitting: Cardiology

## 2014-12-22 NOTE — Telephone Encounter (Signed)
Patient was told to let her doctor's nurse know that her BP is elevated. Yesterday BP was 155/72, and today it is BP 153/77 hr 67. Patient is not too concerned, but knows if her BP is up she was told to take Losartan. Patient has not taking any Losartan at this time. Patient has been taking all her other BP medications. Informed patient that message would be forward to [name] and his nurse [name] to see about changing any medications.

## 2014-12-22 NOTE — Telephone Encounter (Signed)
NEw Message  Pt was told by [facility] to contact office to speak about BP problems. Please call back and dsicuss.    Pt c/o BP issue: STAT if pt c/o blurred vision, one-sided weakness or slurred speech  1. What are your last 5 BP readings? n/a  2. Are you having any other symptoms (ex. Dizziness, headache, blurred vision, passed out)? no  3. What is your BP issue? Higher than normal

## 2014-12-23 ENCOUNTER — Ambulatory Visit (INDEPENDENT_AMBULATORY_CARE_PROVIDER_SITE_OTHER): Payer: Commercial Managed Care - HMO | Admitting: Pharmacist

## 2014-12-23 DIAGNOSIS — I482 Chronic atrial fibrillation, unspecified: Secondary | ICD-10-CM

## 2014-12-23 DIAGNOSIS — Z5181 Encounter for therapeutic drug level monitoring: Secondary | ICD-10-CM

## 2014-12-23 LAB — POCT INR: INR: 2.2

## 2014-12-23 NOTE — Telephone Encounter (Signed)
Patient had new blood pressure equipment from [place] brought out yesterday. Patient stated her personal blood pressure machine is consistent with the nurse who does her home visits She will continue to use that for blood pressure checks until she discusses blood pressure equipment with [facility] when she comes back next week.  Patient just wanted for  [name] to be aware she never started Losartan Blood pressure systolic is in 300'F with her machine.  Did advise ok to take Losartan it systolic above 110, verbalized understanding

## 2014-12-23 NOTE — Telephone Encounter (Signed)
If her BP is above 241 systolic she can take losartan 25 mg that day.

## 2014-12-28 DIAGNOSIS — T83711A Erosion of implanted vaginal mesh and other prosthetic materials to surrounding organ or tissue, initial encounter: Secondary | ICD-10-CM | POA: Diagnosis not present

## 2014-12-29 DIAGNOSIS — E039 Hypothyroidism, unspecified: Secondary | ICD-10-CM | POA: Diagnosis not present

## 2014-12-29 DIAGNOSIS — I4891 Unspecified atrial fibrillation: Secondary | ICD-10-CM | POA: Diagnosis not present

## 2014-12-29 DIAGNOSIS — I5032 Chronic diastolic (congestive) heart failure: Secondary | ICD-10-CM | POA: Diagnosis not present

## 2014-12-29 DIAGNOSIS — Z7901 Long term (current) use of anticoagulants: Secondary | ICD-10-CM | POA: Diagnosis not present

## 2014-12-29 DIAGNOSIS — Z85850 Personal history of malignant neoplasm of thyroid: Secondary | ICD-10-CM | POA: Diagnosis not present

## 2014-12-29 DIAGNOSIS — I341 Nonrheumatic mitral (valve) prolapse: Secondary | ICD-10-CM | POA: Diagnosis not present

## 2014-12-29 DIAGNOSIS — R531 Weakness: Secondary | ICD-10-CM | POA: Diagnosis not present

## 2015-01-04 DIAGNOSIS — T83711A Erosion of implanted vaginal mesh and other prosthetic materials to surrounding organ or tissue, initial encounter: Secondary | ICD-10-CM | POA: Diagnosis not present

## 2015-01-05 DIAGNOSIS — N8182 Incompetence or weakening of pubocervical tissue: Secondary | ICD-10-CM | POA: Diagnosis not present

## 2015-01-05 DIAGNOSIS — N8111 Cystocele, midline: Secondary | ICD-10-CM | POA: Diagnosis not present

## 2015-01-09 DIAGNOSIS — N8182 Incompetence or weakening of pubocervical tissue: Secondary | ICD-10-CM | POA: Diagnosis not present

## 2015-01-09 DIAGNOSIS — N8111 Cystocele, midline: Secondary | ICD-10-CM | POA: Diagnosis not present

## 2015-01-12 ENCOUNTER — Ambulatory Visit (INDEPENDENT_AMBULATORY_CARE_PROVIDER_SITE_OTHER): Payer: Commercial Managed Care - HMO | Admitting: Cardiology

## 2015-01-12 DIAGNOSIS — I482 Chronic atrial fibrillation, unspecified: Secondary | ICD-10-CM

## 2015-01-12 DIAGNOSIS — I5032 Chronic diastolic (congestive) heart failure: Secondary | ICD-10-CM | POA: Diagnosis not present

## 2015-01-12 DIAGNOSIS — R531 Weakness: Secondary | ICD-10-CM | POA: Diagnosis not present

## 2015-01-12 DIAGNOSIS — E039 Hypothyroidism, unspecified: Secondary | ICD-10-CM | POA: Diagnosis not present

## 2015-01-12 DIAGNOSIS — Z7901 Long term (current) use of anticoagulants: Secondary | ICD-10-CM | POA: Diagnosis not present

## 2015-01-12 DIAGNOSIS — Z5181 Encounter for therapeutic drug level monitoring: Secondary | ICD-10-CM

## 2015-01-12 DIAGNOSIS — Z85850 Personal history of malignant neoplasm of thyroid: Secondary | ICD-10-CM | POA: Diagnosis not present

## 2015-01-12 DIAGNOSIS — I4891 Unspecified atrial fibrillation: Secondary | ICD-10-CM | POA: Diagnosis not present

## 2015-01-12 DIAGNOSIS — I341 Nonrheumatic mitral (valve) prolapse: Secondary | ICD-10-CM | POA: Diagnosis not present

## 2015-01-12 LAB — POCT INR: INR: 2.5

## 2015-01-13 DIAGNOSIS — I482 Chronic atrial fibrillation: Secondary | ICD-10-CM | POA: Diagnosis not present

## 2015-01-13 DIAGNOSIS — I341 Nonrheumatic mitral (valve) prolapse: Secondary | ICD-10-CM | POA: Diagnosis not present

## 2015-01-13 DIAGNOSIS — I10 Essential (primary) hypertension: Secondary | ICD-10-CM | POA: Diagnosis not present

## 2015-01-13 DIAGNOSIS — I5032 Chronic diastolic (congestive) heart failure: Secondary | ICD-10-CM | POA: Diagnosis not present

## 2015-01-17 ENCOUNTER — Other Ambulatory Visit: Payer: Self-pay | Admitting: Cardiology

## 2015-01-17 NOTE — Telephone Encounter (Signed)
furosemide (LASIX) 20 MG tablet [[id number]]       furosemide (LASIX) 20 MG tablet [[id number]] DISCONTINUED     Order Details    Dose, Route, Frequency: As Directed Order Comments:   Patient lost her mail order      Dispense Quantity:  90 tablet Refills:  3 Fills Remaining:  3          Sig: Take 2 tablets in the morning and 1 tablet in the afternnon         Discontinue Date:  12/09/2014 1201 Discontinue User:  Karlyne Greenspan [facility] Discontinue Reason:  Error   Written Date:  09/28/14 Expiration Date:  09/28/15     Start Date:  09/28/14 End Date:  12/09/14     Ordering Provider:  Darlin Coco, MD Authorizing Provider:  Darlin Coco, MD Ordering User:  Earvin Hansen

## 2015-01-18 ENCOUNTER — Encounter: Payer: Self-pay | Admitting: Cardiology

## 2015-01-18 ENCOUNTER — Ambulatory Visit (INDEPENDENT_AMBULATORY_CARE_PROVIDER_SITE_OTHER): Payer: Commercial Managed Care - HMO | Admitting: Cardiology

## 2015-01-18 VITALS — BP 110/76 | HR 69 | Ht 60.0 in | Wt 142.8 lb

## 2015-01-18 DIAGNOSIS — I119 Hypertensive heart disease without heart failure: Secondary | ICD-10-CM

## 2015-01-18 DIAGNOSIS — I340 Nonrheumatic mitral (valve) insufficiency: Secondary | ICD-10-CM

## 2015-01-18 DIAGNOSIS — I482 Chronic atrial fibrillation, unspecified: Secondary | ICD-10-CM

## 2015-01-18 MED ORDER — FUROSEMIDE 20 MG PO TABS: ORAL_TABLET | ORAL | Status: DC

## 2015-01-18 NOTE — Progress Notes (Signed)
Cardiology Office Note   Date:  01/18/2015   ID:  Kristen Whitehead, DOB February 11, 1923, MRN [id number]  PCP:  Kristen Pac, MD  Cardiologist: Darlin Coco MD  No chief complaint on file.     History of Present Illness:  Kristen Whitehead is a 81 y.o. female with a hx of chronic AFib, mitral regurgitation, aortic insufficiency, HTN, diastolic HF, HL.  Admitted 3/24-3/25 with weakness and jaw and chest pain (occurred 4 days prior to presentation). Cardiac enzymes remained negative. Echo demonstrated normal LVF, mild BAE, mild AI and mild MR. Orthostatic vital signs were positive. Amlodipine was discontinued. Compression stockings were recommended. Close follow-up was recommended.  Since then she has been feeling well.  She is not having any dizziness or syncope.  Her blood pressure has been stable without taking the amlodipine.  She has losartan on hand but has not had to take it either.   Studies/Reports Reviewed Today:  Echo 11/11/14 - EF 60% to 65%. - Aortic valve: There was mild regurgitation. - Mitral valve: Calcified annulus. Mildly thickened leaflets. There was mild regurgitation. - Left atrium: The atrium was mildly dilated. - Right atrium: The atrium was mildly dilated.  Nuclear 03/18/12 Normal stress nuclear study. LV Ejection Fraction: 75%. LV Wall Motion: NL LV Function; NL Wall Motion  Since last visit she has been feeling well.  She is pleased to be feeling so well at age 14  Past Medical History  Diagnosis Date  . History of mitral valve prolapse 06/15/2008    a. echo 1/14: mild LVH, EF 60-65%, mild MR, mild to mod BAE, PASP 35  . Chronic atrial fibrillation     managed with rate control and coumadin  . History of congenital mitral regurgitation     mild  . Hypothyroidism   . Hypercholesterolemia   . GERD (gastroesophageal reflux disease)   . Chest pain     no known ischemic heart disease; negative Myoview July 2013. EF 75% with no ischemia.    . Hypertension   . Bladder prolapse, female, acquired   . Chronic diastolic heart failure   . Chronic anticoagulation   . Heart disease   . Cancer     thyroid    Past Surgical History  Procedure Laterality Date  . Thyroidectomy    . Cholecystectomy    . Abdominal hysterectomy    . Cataract extraction       Current Outpatient Prescriptions  Medication Sig Dispense Refill  . atenolol (TENORMIN) 25 MG tablet Take 1 tablet (25 mg total) by mouth 3 (three) times daily. 270 tablet 3  . beta carotene w/minerals (OCUVITE) tablet Take 1 tablet by mouth daily.    . calcium carbonate (OS-CAL - DOSED IN MG OF ELEMENTAL CALCIUM) 1250 MG tablet Take 1 tablet by mouth 2 (two) times daily.     . Cholecalciferol (VITAMIN D) 2000 UNITS tablet Take 2,000 Units by mouth daily.     . clobetasol ointment (TEMOVATE) 4.09 % Apply 1 application topically daily as needed. For skin irritation    . estradiol (ESTRACE VAGINAL) 0.1 MG/GM vaginal cream Place 2 g vaginally once a week.     . furosemide (LASIX) 20 MG tablet Take 2 tablets by mouth  in the morning and 1 tablet in the evening 270 tablet 3  . isosorbide mononitrate (IMDUR) 30 MG 24 hr tablet Take 30 mg by mouth daily.    Marland Kitchen latanoprost (XALATAN) 0.005 % ophthalmic solution Place 1 drop into  the left eye at bedtime.     Marland Kitchen levothyroxine (SYNTHROID, LEVOTHROID) 88 MCG tablet Take 88 mcg by mouth daily before breakfast.     . losartan (COZAAR) 25 MG tablet Take 25 mg by mouth as needed (for systlic blood pressure above 150).    . Magnesium 250 MG TABS Take 250 mg by mouth at bedtime.    . Magnesium 500 MG TABS Take 500 mg by mouth at bedtime.    Marland Kitchen NITROSTAT 0.4 MG SL tablet DISSOLVE 1 TABLET UNDER THE TONGUE EVERY 5 MINUTES FOR 3 DOSES AS NEEDED  FOR  CHEST  PAIN 25 tablet 5  . potassium chloride (K-DUR,KLOR-CON) 10 MEQ tablet Take 20 mEq by mouth daily.    . pramipexole (MIRAPEX) 0.125 MG tablet Take 1 tablet (0.125 mg total) by mouth every evening. 90  tablet 3  . RESTASIS 0.05 % ophthalmic emulsion Place 1 drop into both eyes 2 (two) times daily.     . temazepam (RESTORIL) 15 MG capsule Take 15 mg by mouth at bedtime.    . vitamin C (ASCORBIC ACID) 500 MG tablet Take 500 mg by mouth daily.    Marland Kitchen warfarin (COUMADIN) 5 MG tablet TAKE AS DIRECTED BY ANTICOAGULATION CLINIC 90 tablet 1   No current facility-administered medications for this visit.    Allergies:   Azithromycin; Compazine; Demerol; Dolophine; Ebastine; Erythromycin; Ibuprofen; Statins; Tetanus toxoids; and Tetracyclines & related    Social History:  The patient  reports that she has never smoked. She has never used smokeless tobacco. She reports that she does not drink alcohol or use illicit drugs.   Family History:  The patient's family history includes Stroke in her father and mother. There is no history of Heart attack or Heart disease.    ROS:  Please see the history of present illness.   Otherwise, review of systems are positive for none.   All other systems are reviewed and negative.    PHYSICAL EXAM: VS:  BP 110/76 mmHg  Pulse 69  Ht 5' (1.524 m)  Wt 142 lb 12.8 oz (64.774 kg)  BMI 27.89 kg/m2 , BMI Body mass index is 27.89 kg/(m^2). GEN: Well nourished, well developed, in no acute distress HEENT: normal Neck: no JVD, carotid bruits, or masses Cardiac: Irregularly irregular.  There is a grade 1/6 apical systolic murmur of mitral regurgitation.  There is no pitting edema. Respiratory:  clear to auscultation bilaterally, normal work of breathing GI: soft, nontender, nondistended, + BS MS: no deformity or atrophy Skin: warm and dry, no rash Neuro:  Strength and sensation are intact Psych: euthymic mood, full affect   EKG:  EKG is not ordered today.   Recent Labs: 08/01/2014: Pro B Natriuretic peptide (BNP) 2372.0* 11/11/2014: ALT 32; BUN 10; Creatinine 0.67; Hemoglobin 13.5; Magnesium 1.9; Platelets 204; Potassium 3.8; Sodium 139; TSH 4.393    Lipid  Panel    Component Value Date/Time   CHOL 211* 11/04/2012 1524   TRIG 201.0* 11/04/2012 1524   HDL 66.80 11/04/2012 1524   CHOLHDL 3 11/04/2012 1524   VLDL 40.2* 11/04/2012 1524   LDLCALC 109* 04/16/2012 1043   LDLDIRECT 98.8 11/04/2012 1524      Wt Readings from Last 3 Encounters:  01/18/15 142 lb 12.8 oz (64.774 kg)  11/22/14 142 lb (64.411 kg)  11/10/14 140 lb 12.8 oz (63.866 kg)        ASSESSMENT AND PLAN:  Postural hypotension Improved.  She does not always wear support stockings.  Chest  pain, unspecified chest pain type This is resolved. With her increased indigestion, I suspect her chest pain is related to GI etiology. I have asked her to take Pepcid as needed.  Chronic atrial fibrillation Rate remains controlled. Continue Coumadin.   Benign hypertensive heart disease without heart failure Blood pressure remains controlled.  Chronic diastolic heart failure Volume is stable.  Mitral regurgitation  Recent echocardiogram with mild mitral regurgitation and normal ejection fraction.   Current medicines are reviewed at length with the patient today.  The patient does not have concerns regarding medicines.  The following changes have been made:  no change  Labs/ tests ordered today include:  No orders of the defined types were placed in this encounter.    Plan: Continue same medication.  Recheck in 4 months for follow-up office visit and EKG   Signed, Darlin Coco MD 01/18/2015 1:44 PM    [facility] [address], [place], [place]  [address] Phone: [phone]; Fax: [phone]

## 2015-01-18 NOTE — Patient Instructions (Signed)
Medication Instructions:  Your physician recommends that you continue on your current medications as directed. Please refer to the Current Medication list given to you today.  Labwork: none  Testing/Procedures: none  Follow-Up: Your physician wants you to follow-up in: 4 month ov/ekg  You will receive a reminder letter in the mail two months in advance. If you don't receive a letter, please call our office to schedule the follow-up appointment.   Any Other Special Instructions Will Be Listed Below (If Applicable).   

## 2015-01-19 DIAGNOSIS — L309 Dermatitis, unspecified: Secondary | ICD-10-CM | POA: Diagnosis not present

## 2015-01-19 DIAGNOSIS — Z85828 Personal history of other malignant neoplasm of skin: Secondary | ICD-10-CM | POA: Diagnosis not present

## 2015-01-19 DIAGNOSIS — D1801 Hemangioma of skin and subcutaneous tissue: Secondary | ICD-10-CM | POA: Diagnosis not present

## 2015-01-19 DIAGNOSIS — L570 Actinic keratosis: Secondary | ICD-10-CM | POA: Diagnosis not present

## 2015-01-19 DIAGNOSIS — L720 Epidermal cyst: Secondary | ICD-10-CM | POA: Diagnosis not present

## 2015-01-19 DIAGNOSIS — L821 Other seborrheic keratosis: Secondary | ICD-10-CM | POA: Diagnosis not present

## 2015-01-19 DIAGNOSIS — D485 Neoplasm of uncertain behavior of skin: Secondary | ICD-10-CM | POA: Diagnosis not present

## 2015-01-19 DIAGNOSIS — C44622 Squamous cell carcinoma of skin of right upper limb, including shoulder: Secondary | ICD-10-CM | POA: Diagnosis not present

## 2015-01-20 ENCOUNTER — Encounter: Payer: Self-pay | Admitting: Neurology

## 2015-01-20 ENCOUNTER — Ambulatory Visit (INDEPENDENT_AMBULATORY_CARE_PROVIDER_SITE_OTHER): Payer: Commercial Managed Care - HMO | Admitting: Neurology

## 2015-01-20 VITALS — BP 118/72 | HR 82 | Resp 16 | Ht 60.0 in | Wt 138.0 lb

## 2015-01-20 DIAGNOSIS — I482 Chronic atrial fibrillation, unspecified: Secondary | ICD-10-CM

## 2015-01-20 DIAGNOSIS — G2581 Restless legs syndrome: Secondary | ICD-10-CM | POA: Diagnosis not present

## 2015-01-20 DIAGNOSIS — R600 Localized edema: Secondary | ICD-10-CM

## 2015-01-20 DIAGNOSIS — R0789 Other chest pain: Secondary | ICD-10-CM | POA: Diagnosis not present

## 2015-01-20 NOTE — Progress Notes (Signed)
Subjective:    Kristen Whitehead ID: Kristen Whitehead is a 81 y.o. female.  HPI     Interim history:  Kristen Whitehead is a very pleasant 81 year old right-handed woman with an underlying medical history of hypertension, hyperlipidemia, atrial fibrillation, celiac disease, scoliosis, reflux disease, who presents for followup consultation of Kristen Whitehead bilateral foot pain, likely RLS. She is unaccompanied today. I last saw Kristen Whitehead on 07/21/2014, at which time she reported a fall about 3 months prior. She had tripped over something. She had no significant injuries and no sequelae. She was not drinking enough water. Kristen Whitehead leg swelling was stable. I suggested she continue with low-dose Mirapex, 0.125 mg once nightly.   Today, 01/20/2015: She reports feeling stable for the most part. Kristen Whitehead burning sensation seems about the same and Kristen Whitehead leg cramping seems a little less overall. She is tolerating low-dose Mirapex. She saw Kristen Whitehead cardiologist 2 days ago. I reviewed the office note from [name] from 01/18/2015. She presented to the emergency room on 11/10/2014 with chest pain and dizziness. She was admitted for workup. Cardiac workup was negative. She has been in A. Fib. She is rate controlled. She has stable lower extremity swelling and was reminded by Kristen Whitehead cardiologist to use he compression stockings. She admits that she does not drink enough water.  Previously:    I saw Kristen Whitehead on 12/27/2013, at which time she noted ankle swelling. She had gone into cardiac failure the year before and felt similar to that. Nevertheless, because of Kristen Whitehead lower extremity swelling I suggested she cut back on Mirapex to 0.125 mg each night and see if the swelling improves. If she had flareup in Kristen Whitehead restless leg symptoms I suggested she could go back to 0.25 mg nightly.   I saw Kristen Whitehead on 06/28/2013, at which time I noted on clinical exam that she had mild evidence of neuropathy in the absence of a history of diabetes with normal hemoglobin A1c noted and  normal B12 levels. She had EMG and nerve conduction studies that did not show any significant neuropathy. I felt that she had most likely RLS. She had done well with Mirapex. I did not make any changes to Kristen Whitehead medication regimen and asked Kristen Whitehead to continue taking Mirapex 0.25 mg each night. She did not have any side effects at the time.  I first met Kristen Whitehead on 02/17/2013, at which time I suggested EMG and nerve conduction testing and a treatment trial with Mirapex because of possible underlying RLS. She has previously seen a podiatrist who had done a nerve biopsy which she reported was normal. She has a history of A. fib. She has a history of congestive heart failure and is followed by cardiology. Echocardiogram in January 2014 showed biatrial enlargement and mild mitral regurgitation and an EF of 67-12% with diastolic dysfunction.   For years she has had burning in Kristen Whitehead feet and started having a crawling sensation in Kristen Whitehead distal legs in 2014. She has also had cramping in Kristen Whitehead muscles for years and she was taken off of Kristen Whitehead statin in 2013 and Kristen Whitehead cramping eventually became better. She has had pins and needles sensation and it helps to walk around. She has worse symptoms at night or by the end of the day. She remembers having "terrible growing pains" as a child and does not have a FHx of RLS as far as she knows. She had nerve biopsies both legs by Kristen Whitehead podiatrist, which did not show inflammation. While she has a history of chronic back  pain she denied any radiating back pain to Kristen Whitehead legs. In fact she described pins and needle and crawling sensation in the distal lower extremities, radiating upwards. She has been taking OTC Mg supplements, which helped Kristen Whitehead cramps.    Kristen Whitehead EMG and nerve conduction testing from 03/03/2013 showed no evidence of peripheral neuropathy. We called Kristen Whitehead with Kristen Whitehead test results. Kristen Whitehead foot pain and paresthesias improved.  Kristen Whitehead Past Medical History Is Significant For: Past Medical History  Diagnosis Date   . History of mitral valve prolapse 06/15/2008    a. echo 1/14: mild LVH, EF 60-65%, mild MR, mild to mod BAE, PASP 35  . Chronic atrial fibrillation     managed with rate control and coumadin  . History of congenital mitral regurgitation     mild  . Hypothyroidism   . Hypercholesterolemia   . GERD (gastroesophageal reflux disease)   . Chest pain     no known ischemic heart disease; negative Myoview July 2013. EF 75% with no ischemia.   . Hypertension   . Bladder prolapse, female, acquired   . Chronic diastolic heart failure   . Chronic anticoagulation   . Heart disease   . Cancer     thyroid    Kristen Whitehead Past Surgical History Is Significant For: Past Surgical History  Procedure Laterality Date  . Thyroidectomy    . Cholecystectomy    . Abdominal hysterectomy    . Cataract extraction      Kristen Whitehead Family History Is Significant For: Family History  Problem Relation Age of Onset  . Heart attack Neg Hx   . Heart disease Neg Hx   . Stroke Mother   . Stroke Father     Kristen Whitehead Social History Is Significant For: History   Social History  . Marital Status: Widowed    Spouse Name: N/A  . Number of Children: 3  . Years of Education: BA   Occupational History  .     Social History Main Topics  . Smoking status: Never Smoker   . Smokeless tobacco: Never Used  . Alcohol Use: No  . Drug Use: No  . Sexual Activity: No   Other Topics Concern  . None   Social History Narrative   Pt lives at home alone.   Caffeine Use: quit 65yr ago    Kristen Whitehead Allergies Are:  Allergies  Allergen Reactions  . Azithromycin   . Compazine [Prochlorperazine Edisylate]   . Demerol   . Dolophine [Methadone]   . Ebastine     EBS  . Erythromycin   . Ibuprofen   . Statins     myalgias  . Tetanus Toxoids   . Tetracyclines & Related   :   Kristen Whitehead Current Medications Are:  Outpatient Encounter Prescriptions as of 01/20/2015  Medication Sig  . atenolol (TENORMIN) 25 MG tablet Take 1 tablet (25 mg total)  by mouth 3 (three) times daily.  . beta carotene w/minerals (OCUVITE) tablet Take 1 tablet by mouth daily.  . calcium carbonate (OS-CAL - DOSED IN MG OF ELEMENTAL CALCIUM) 1250 MG tablet Take 1 tablet by mouth 2 (two) times daily.   . Cholecalciferol (VITAMIN D) 2000 UNITS tablet Take 2,000 Units by mouth daily.   . clobetasol ointment (TEMOVATE) 06.96% Apply 1 application topically daily as needed. For skin irritation  . estradiol (ESTRACE VAGINAL) 0.1 MG/GM vaginal cream Place 2 g vaginally once a week.   . furosemide (LASIX) 20 MG tablet Take 2 tablets by mouth  in  the morning and 1 tablet in the evening  . isosorbide mononitrate (IMDUR) 30 MG 24 hr tablet Take 30 mg by mouth daily.  Marland Kitchen latanoprost (XALATAN) 0.005 % ophthalmic solution Place 1 drop into the left eye at bedtime.   Marland Kitchen levothyroxine (SYNTHROID, LEVOTHROID) 88 MCG tablet Take 88 mcg by mouth daily before breakfast.   . losartan (COZAAR) 25 MG tablet Take 25 mg by mouth as needed (for systlic blood pressure above 150).  . Magnesium 250 MG TABS Take 250 mg by mouth at bedtime.  . Magnesium 500 MG TABS Take 500 mg by mouth at bedtime.  Marland Kitchen NITROSTAT 0.4 MG SL tablet DISSOLVE 1 TABLET UNDER THE TONGUE EVERY 5 MINUTES FOR 3 DOSES AS NEEDED  FOR  CHEST  PAIN  . potassium chloride (K-DUR,KLOR-CON) 10 MEQ tablet Take 20 mEq by mouth daily.  . pramipexole (MIRAPEX) 0.125 MG tablet Take 1 tablet (0.125 mg total) by mouth every evening.  . RESTASIS 0.05 % ophthalmic emulsion Place 1 drop into both eyes 2 (two) times daily.   . temazepam (RESTORIL) 15 MG capsule Take 15 mg by mouth at bedtime.  . vitamin C (ASCORBIC ACID) 500 MG tablet Take 500 mg by mouth daily.  Marland Kitchen warfarin (COUMADIN) 5 MG tablet TAKE AS DIRECTED BY ANTICOAGULATION CLINIC   No facility-administered encounter medications on file as of 01/20/2015.  :  Review of Systems:  Out of a complete 14 point review of systems, all are reviewed and negative with the exception of these  symptoms as listed below:   Review of Systems  Neurological:       Reports cramping in feet but states it is less often now, takes magnesium suppliment. Burning sensation in both feet.     Objective:  Neurologic Exam  Physical Exam Physical Examination:   Filed Vitals:   01/20/15 1205  BP: 118/72  Pulse: 82  Resp: 16    General Examination: The Kristen Whitehead is a very pleasant 81 y.o. female in no acute distress. She appears well-developed and well-nourished and well groomed.   HEENT: Normocephalic, atraumatic, pupils are equal, round and reactive to light and accommodation. She is s/p bilateral cataract repairs. She has what appears to be a scar in the medial aspect of Kristen Whitehead R cornea. Extraocular tracking is good without limitation to gaze excursion or nystagmus noted. Normal smooth pursuit is noted. Hearing is grossly intact. Face is symmetric with normal facial animation and normal facial sensation. Speech is clear with no dysarthria noted. There is no hypophonia. There is no lip, neck/head, jaw or voice tremor. Neck is supple with full range of passive and active motion. There are no carotid bruits on auscultation. Oropharynx exam reveals: moderate mouth dryness, adequate dental hygiene and no airway crowding. Tongue protrudes centrally and palate elevates symmetrically.   Chest: Clear to auscultation without wheezing, rhonchi or crackles noted.  Heart: S1+S2+0, irregularly irregular, slight systolic murmur noted.    Abdomen: Soft, non-tender and non-distended with normal bowel sounds appreciated on auscultation.  Extremities: There is 1 to 2+ pitting edema in the distal lower extremities bilaterally, unchanged. Pedal pulses are intact.  Skin: Warm and dry without trophic changes noted. There are no varicose veins.  Musculoskeletal: exam reveals no obvious joint deformities, tenderness or joint swelling or erythema.   Neurologically:  Mental status: The Kristen Whitehead is awake, alert and  oriented in all 4 spheres. Kristen Whitehead memory, attention, language and knowledge are appropriate. There is no aphasia, agnosia, apraxia or anomia. Speech is  clear with normal prosody and enunciation. Thought process is linear. Mood is congruent and affect is normal.  Cranial nerves are as described above under HEENT exam. In addition, shoulder shrug is normal with equal shoulder height noted. Motor exam: Normal bulk, strength and tone is noted. There is no drift, tremor or rebound. Romberg is negative. Reflexes are 1+ in the UEs and trace in the knees and absent in both ankles. Toes are downgoing bilaterally. Fine motor skills are intact with normal finger taps, normal hand movements, normal rapid alternating patting, normal foot taps and normal foot agility.  Cerebellar testing shows no dysmetria or intention tremor on finger to nose testing. There is no truncal or gait ataxia.  Sensory exam is intact to light touch, pinprick, vibration, temperature sense in the upper extremities, but mildly decreased to all modalities in the distal LEs, up to mid-shin.  Gait, station and balance: she stands with no significant difficulty. No veering to one side is noted. No leaning to one side is noted. Posture is age-appropriate and stance is slightly wide-based. No problems turning are noted. Kristen Whitehead R foot tends to point outward some, unchanged. She walks cautiously. She does not use any assistive device.   Assessment and Plan:   In summary, Kristen Whitehead is a very pleasant 81 year old female with a history of leg and foot pain. Kristen Whitehead history and physical exam are consistent with mild neuropathy. She has had symptoms for about 10 to 15 years with very little progression reported. She's not diabetic and had a normal B12 level. Kristen Whitehead exam is stable and she may have a combination of mild PN and RLS. Kristen Whitehead EMG/NCV from 2014 did not show any evidence of significant neuropathy. She has done somewhat better after starting Mirapex low  dose, which I reduced to just 0.125 mg in 5/15 because of lower extremity swelling. This seems stable. She had an appointment with [name] on 01/18/15. She is on pramipexole 0.125 mg each night. I asked  Kristen Whitehead to drink more water, use compression stockings to Kristen Whitehead lower extremities and continue Mirapex at the current dose. She did not need a refill today. I suggested a follow-up in 6 months, sooner if the need arises. I answered all Kristen Whitehead questions today and she was in agreement. I spent 15 minutes in total face-to-face time with the Kristen Whitehead, more than 50% of which was spent in counseling and coordination of care, reviewing test results, reviewing medication and discussing or reviewing the diagnosis of PN, RLS, the prognosis and treatment options.

## 2015-01-20 NOTE — Patient Instructions (Signed)
We will continue with low dose pramipexole.   Drink more water.   Use your compression hoses.   Use a cane for safety.   I will see you back in 6 months.

## 2015-01-24 DIAGNOSIS — N8110 Cystocele, unspecified: Secondary | ICD-10-CM | POA: Diagnosis not present

## 2015-01-24 DIAGNOSIS — I341 Nonrheumatic mitral (valve) prolapse: Secondary | ICD-10-CM | POA: Diagnosis not present

## 2015-01-24 DIAGNOSIS — Z7901 Long term (current) use of anticoagulants: Secondary | ICD-10-CM | POA: Diagnosis not present

## 2015-01-24 DIAGNOSIS — I482 Chronic atrial fibrillation: Secondary | ICD-10-CM | POA: Diagnosis not present

## 2015-01-24 DIAGNOSIS — Z85850 Personal history of malignant neoplasm of thyroid: Secondary | ICD-10-CM | POA: Diagnosis not present

## 2015-01-24 DIAGNOSIS — Z5181 Encounter for therapeutic drug level monitoring: Secondary | ICD-10-CM | POA: Diagnosis not present

## 2015-01-24 DIAGNOSIS — I5032 Chronic diastolic (congestive) heart failure: Secondary | ICD-10-CM | POA: Diagnosis not present

## 2015-01-24 DIAGNOSIS — Q233 Congenital mitral insufficiency: Secondary | ICD-10-CM | POA: Diagnosis not present

## 2015-01-24 DIAGNOSIS — I10 Essential (primary) hypertension: Secondary | ICD-10-CM | POA: Diagnosis not present

## 2015-01-27 DIAGNOSIS — N8182 Incompetence or weakening of pubocervical tissue: Secondary | ICD-10-CM | POA: Diagnosis not present

## 2015-01-30 DIAGNOSIS — L03113 Cellulitis of right upper limb: Secondary | ICD-10-CM | POA: Diagnosis not present

## 2015-01-30 DIAGNOSIS — Z85828 Personal history of other malignant neoplasm of skin: Secondary | ICD-10-CM | POA: Diagnosis not present

## 2015-02-01 ENCOUNTER — Telehealth: Payer: Self-pay | Admitting: Cardiology

## 2015-02-01 ENCOUNTER — Ambulatory Visit (INDEPENDENT_AMBULATORY_CARE_PROVIDER_SITE_OTHER): Payer: Commercial Managed Care - HMO | Admitting: Internal Medicine

## 2015-02-01 DIAGNOSIS — N390 Urinary tract infection, site not specified: Secondary | ICD-10-CM | POA: Diagnosis not present

## 2015-02-01 DIAGNOSIS — Z5181 Encounter for therapeutic drug level monitoring: Secondary | ICD-10-CM | POA: Diagnosis not present

## 2015-02-01 DIAGNOSIS — N8110 Cystocele, unspecified: Secondary | ICD-10-CM | POA: Diagnosis not present

## 2015-02-01 DIAGNOSIS — I5032 Chronic diastolic (congestive) heart failure: Secondary | ICD-10-CM | POA: Diagnosis not present

## 2015-02-01 DIAGNOSIS — Z85850 Personal history of malignant neoplasm of thyroid: Secondary | ICD-10-CM | POA: Diagnosis not present

## 2015-02-01 DIAGNOSIS — Z7901 Long term (current) use of anticoagulants: Secondary | ICD-10-CM | POA: Diagnosis not present

## 2015-02-01 DIAGNOSIS — I482 Chronic atrial fibrillation, unspecified: Secondary | ICD-10-CM

## 2015-02-01 DIAGNOSIS — Q233 Congenital mitral insufficiency: Secondary | ICD-10-CM | POA: Diagnosis not present

## 2015-02-01 DIAGNOSIS — I341 Nonrheumatic mitral (valve) prolapse: Secondary | ICD-10-CM | POA: Diagnosis not present

## 2015-02-01 DIAGNOSIS — I10 Essential (primary) hypertension: Secondary | ICD-10-CM | POA: Diagnosis not present

## 2015-02-01 LAB — POCT INR: INR: 2.4

## 2015-02-01 NOTE — Telephone Encounter (Signed)
New problem   Pt need call back concerning all her medication she is suppose to be taken. Please advise.

## 2015-02-01 NOTE — Telephone Encounter (Signed)
Pt called because she was not sure if she needs to continue taking Imdur. Pt said that she had stop taking that medication since after the last office visit, because she though Dr Mare Ferrari had D/C that medication, and pt has been feels bad.   Pt was made aware that she supposed to take the Imdur 30 mg daily, and she needs to restart that medication today. Pt verbalized understanding.

## 2015-02-02 ENCOUNTER — Telehealth: Payer: Self-pay | Admitting: Cardiology

## 2015-02-02 NOTE — Telephone Encounter (Signed)
Spoke with pt and she states that nurse from [facility] advised her that she really needed to start taking her PRN Losartan.  BP values listed in the original message repeated to me by pt. Pt states that she took the Losartan last night and woke up this morning and her BP was 134/87. Pt wasn't sure on how often to take this medication.  Informed pt to take once daily. Pt states that she is feeling much better today then she has been feeling. Pt denies CP, dizziness, lightheadedness, or SOB. Pt states that she has been feeling fatigued but feels better today. Informed pt to record her BP and HR for one week and record how often she is taking the Losartan and to call our office in a week with this information. Informed pt that I would route this information to [name] for review and advisement in the meantime. Pt verbalized understanding and was agreeable to call back next Thurs or Fri with information.

## 2015-02-02 NOTE — Telephone Encounter (Signed)
Agree with advice given

## 2015-02-02 NOTE — Telephone Encounter (Signed)
   Pt c/o BP issue: STAT if pt c/o blurred vision, one-sided weakness or slurred speech  1. What are your last 5 BP readings?  Per [facility] BP machine- 6/16- 134/87 6/15- 153/89  166/84 6/14- 164/82 6/13- 162/96 6/12 174/95 2. Are you having any other symptoms (ex. Dizziness, headache, blurred vision, passed out)? Feels "fading" no energy, no pain- just not with it.   3. What is your BP issue? Started losartan yesterday, is not quite sure when/how often to take medicine.Pt thinks BP lowered since then. Requests to speak w/ RN about current condition and what to do going forward. Please call back and discuss.

## 2015-02-06 ENCOUNTER — Telehealth: Payer: Self-pay | Admitting: Cardiology

## 2015-02-06 NOTE — Telephone Encounter (Signed)
Spoke with patient and she is very concerned with her blood pressure being elevated in the morning when she gets up, systolic running [id number]'M-210'Z most days Patient stated that her blood pressure is taken first thing in the morning and does come down to 130's after her medications Patient did take her Losartan in the evening for a few days and this seemed to help some but still elevated one morning Patient does not use the Losartan if systolic above 128 in am secondary to having no meds when she takes it first thing Advised patient ok to continue taking the Losartan in the evening and would forward to  [name] for review Patient stated no systolic blood pressure below 100 and only one as low as 104, usually 120 or above

## 2015-02-06 NOTE — Telephone Encounter (Signed)
Pt c/o BP issue: STAT if pt c/o blurred vision, one-sided weakness or slurred speech  1. What are your last 5 BP readings? 133/82, 169/86, 161/79, 127/86  2. Are you having any other symptoms (ex. Dizziness, headache, blurred vision, passed out)? Pt stating that she just isn't herself, weak  3. What is your BP issue? Pt calling stating that her bp is high.

## 2015-02-07 NOTE — Telephone Encounter (Signed)
Yes agree with taking the losartan in the evenings.

## 2015-02-07 NOTE — Telephone Encounter (Signed)
Advised patient

## 2015-02-08 DIAGNOSIS — I341 Nonrheumatic mitral (valve) prolapse: Secondary | ICD-10-CM | POA: Diagnosis not present

## 2015-02-08 DIAGNOSIS — I482 Chronic atrial fibrillation: Secondary | ICD-10-CM | POA: Diagnosis not present

## 2015-02-08 DIAGNOSIS — Z7901 Long term (current) use of anticoagulants: Secondary | ICD-10-CM | POA: Diagnosis not present

## 2015-02-08 DIAGNOSIS — Z5181 Encounter for therapeutic drug level monitoring: Secondary | ICD-10-CM | POA: Diagnosis not present

## 2015-02-08 DIAGNOSIS — Q233 Congenital mitral insufficiency: Secondary | ICD-10-CM | POA: Diagnosis not present

## 2015-02-08 DIAGNOSIS — Z85850 Personal history of malignant neoplasm of thyroid: Secondary | ICD-10-CM | POA: Diagnosis not present

## 2015-02-08 DIAGNOSIS — I10 Essential (primary) hypertension: Secondary | ICD-10-CM | POA: Diagnosis not present

## 2015-02-08 DIAGNOSIS — N8110 Cystocele, unspecified: Secondary | ICD-10-CM | POA: Diagnosis not present

## 2015-02-08 DIAGNOSIS — I5032 Chronic diastolic (congestive) heart failure: Secondary | ICD-10-CM | POA: Diagnosis not present

## 2015-02-09 ENCOUNTER — Ambulatory Visit (INDEPENDENT_AMBULATORY_CARE_PROVIDER_SITE_OTHER): Payer: Commercial Managed Care - HMO | Admitting: Internal Medicine

## 2015-02-09 DIAGNOSIS — I10 Essential (primary) hypertension: Secondary | ICD-10-CM | POA: Diagnosis not present

## 2015-02-09 DIAGNOSIS — Z7901 Long term (current) use of anticoagulants: Secondary | ICD-10-CM | POA: Diagnosis not present

## 2015-02-09 DIAGNOSIS — Z85850 Personal history of malignant neoplasm of thyroid: Secondary | ICD-10-CM | POA: Diagnosis not present

## 2015-02-09 DIAGNOSIS — I482 Chronic atrial fibrillation, unspecified: Secondary | ICD-10-CM

## 2015-02-09 DIAGNOSIS — I5032 Chronic diastolic (congestive) heart failure: Secondary | ICD-10-CM | POA: Diagnosis not present

## 2015-02-09 DIAGNOSIS — N8182 Incompetence or weakening of pubocervical tissue: Secondary | ICD-10-CM | POA: Diagnosis not present

## 2015-02-09 DIAGNOSIS — Q233 Congenital mitral insufficiency: Secondary | ICD-10-CM | POA: Diagnosis not present

## 2015-02-09 DIAGNOSIS — I341 Nonrheumatic mitral (valve) prolapse: Secondary | ICD-10-CM | POA: Diagnosis not present

## 2015-02-09 DIAGNOSIS — N8110 Cystocele, unspecified: Secondary | ICD-10-CM | POA: Diagnosis not present

## 2015-02-09 DIAGNOSIS — Z5181 Encounter for therapeutic drug level monitoring: Secondary | ICD-10-CM

## 2015-02-09 LAB — POCT INR: INR: 2.1

## 2015-02-13 ENCOUNTER — Other Ambulatory Visit: Payer: Self-pay

## 2015-02-17 ENCOUNTER — Telehealth: Payer: Self-pay | Admitting: Cardiology

## 2015-02-17 NOTE — Telephone Encounter (Signed)
° ° ° °  Pt c/o BP issue: STAT if pt c/o blurred vision, one-sided weakness or slurred speech  1. What are your last 5 BP readings? 02/17/15-  174/94,                                                               02/16/15- 158/93,                                                                02/15/15- 168/86,                                                               02/14/15- 175/94                                                                 02/13/15- 142/84  2. Are you having any other symptoms (ex. Dizziness, headache, blurred vision, passed out)? NO  3. What is your BP issue?    HIGH

## 2015-02-17 NOTE — Telephone Encounter (Signed)
Spoke with patient and her blood pressure 30 minutes after taking her morning medications was down to 149/84 Advised patient to stop taking her blood pressure first thing in the morning before her medications, monitor blood pressure at least an hour after medications  Advised to continue her daily weights first thing in am Patient verbalized understanding

## 2015-02-17 NOTE — Telephone Encounter (Signed)
Status: Signed       Expand All Collapse All       Pt c/o BP issue: STAT if pt c/o blurred vision, one-sided weakness or slurred speech  1. What are your last 5 BP readings? 02/17/15- 174/94,   02/16/15- 158/93,   02/15/15- 168/86,   02/14/15- 175/94   02/13/15- 142/84  2. Are you having any other symptoms (ex. Dizziness, headache, blurred vision, passed out)? NO  3. What is your BP issue? HIGH

## 2015-02-17 NOTE — Telephone Encounter (Signed)
Agree with advice given

## 2015-02-17 NOTE — Telephone Encounter (Signed)
   Blood Pressures Recorded at Home:  02/13/15 142/84 02/14/15 175/84 02/15/15 168/86 02/16/15 158/93 02/17/15 174/94  Denies any chest pain, shortness of breath, headache or visual changes.  Has been eating out more than normal.  Does not add salt to food,  Feels better with her blood pressure up.  Has been on Lorsartan 25 mg daily for 3 weeks.   Took blood pressure medications 15 minutes ago and just took blood pressure.  Reading is 165/84.  She will retake her blood pressure again before noon when she goes out and if still up will stop by the office for a nurse visit with blood pressure check

## 2015-02-24 ENCOUNTER — Other Ambulatory Visit: Payer: Self-pay | Admitting: Cardiology

## 2015-02-24 DIAGNOSIS — Z7901 Long term (current) use of anticoagulants: Secondary | ICD-10-CM | POA: Diagnosis not present

## 2015-02-24 DIAGNOSIS — Q233 Congenital mitral insufficiency: Secondary | ICD-10-CM | POA: Diagnosis not present

## 2015-02-24 DIAGNOSIS — Z85850 Personal history of malignant neoplasm of thyroid: Secondary | ICD-10-CM | POA: Diagnosis not present

## 2015-02-24 DIAGNOSIS — I341 Nonrheumatic mitral (valve) prolapse: Secondary | ICD-10-CM | POA: Diagnosis not present

## 2015-02-24 DIAGNOSIS — I482 Chronic atrial fibrillation: Secondary | ICD-10-CM | POA: Diagnosis not present

## 2015-02-24 DIAGNOSIS — I5032 Chronic diastolic (congestive) heart failure: Secondary | ICD-10-CM | POA: Diagnosis not present

## 2015-02-24 DIAGNOSIS — N8110 Cystocele, unspecified: Secondary | ICD-10-CM | POA: Diagnosis not present

## 2015-02-24 DIAGNOSIS — I10 Essential (primary) hypertension: Secondary | ICD-10-CM | POA: Diagnosis not present

## 2015-02-24 DIAGNOSIS — Z5181 Encounter for therapeutic drug level monitoring: Secondary | ICD-10-CM | POA: Diagnosis not present

## 2015-03-08 ENCOUNTER — Telehealth: Payer: Self-pay | Admitting: Cardiology

## 2015-03-08 DIAGNOSIS — E890 Postprocedural hypothyroidism: Secondary | ICD-10-CM | POA: Diagnosis not present

## 2015-03-08 DIAGNOSIS — R531 Weakness: Secondary | ICD-10-CM | POA: Diagnosis not present

## 2015-03-08 NOTE — Telephone Encounter (Signed)
Spoke with patient and she stated when she saw PA at [facility] today they called and spoke with Dr Lovena Le Per patient Dr Lovena Le told PA to have patient hold her Losartan, decrease her Atenolol to just once a day, and needed to be seen tomorrow in office Advised patient to do as advised and would call her tomorrow with ov time

## 2015-03-08 NOTE — Telephone Encounter (Signed)
New Message  Pt c/o BP issue:  1. What are your last 5 BP readings?   07/19 92/63 and 74 hear rate   07/20 113/77 and 99 heart rate  2. Are you having any other symptoms (ex. Dizziness, headache, blurred vision, passed out)? Has a fever and balance is off   3. What is your medication issue? Pt called states that her BP was recently high and that she has taken her BP meds. She reports that her BP is now too low and the pt is inquiring if she should continue to take the medication because of this issue.  Pt states that she was seen by a PCP with [facility] [address] whom alerted the pt that she will need an appointment within 24 hours. Please call back to discuss.

## 2015-03-09 ENCOUNTER — Ambulatory Visit (INDEPENDENT_AMBULATORY_CARE_PROVIDER_SITE_OTHER): Payer: Commercial Managed Care - HMO | Admitting: Cardiology

## 2015-03-09 ENCOUNTER — Telehealth: Payer: Self-pay | Admitting: *Deleted

## 2015-03-09 ENCOUNTER — Encounter: Payer: Self-pay | Admitting: Cardiology

## 2015-03-09 DIAGNOSIS — I5032 Chronic diastolic (congestive) heart failure: Secondary | ICD-10-CM | POA: Diagnosis not present

## 2015-03-09 DIAGNOSIS — Z5181 Encounter for therapeutic drug level monitoring: Secondary | ICD-10-CM | POA: Diagnosis not present

## 2015-03-09 DIAGNOSIS — I341 Nonrheumatic mitral (valve) prolapse: Secondary | ICD-10-CM | POA: Diagnosis not present

## 2015-03-09 DIAGNOSIS — I482 Chronic atrial fibrillation, unspecified: Secondary | ICD-10-CM

## 2015-03-09 DIAGNOSIS — I10 Essential (primary) hypertension: Secondary | ICD-10-CM | POA: Diagnosis not present

## 2015-03-09 DIAGNOSIS — Z85850 Personal history of malignant neoplasm of thyroid: Secondary | ICD-10-CM | POA: Diagnosis not present

## 2015-03-09 DIAGNOSIS — Q233 Congenital mitral insufficiency: Secondary | ICD-10-CM | POA: Diagnosis not present

## 2015-03-09 DIAGNOSIS — Z7901 Long term (current) use of anticoagulants: Secondary | ICD-10-CM | POA: Diagnosis not present

## 2015-03-09 DIAGNOSIS — N8110 Cystocele, unspecified: Secondary | ICD-10-CM | POA: Diagnosis not present

## 2015-03-09 LAB — POCT INR: INR: 5.1

## 2015-03-09 LAB — PROTIME-INR: INR: 4.4 — AB (ref 0.9–1.1)

## 2015-03-09 NOTE — Telephone Encounter (Signed)
Spoke with [facility] and INR 5.1 Venipuncture done and lab will be faxed to office Patient has not eaten well over the last few days per patient Nurse stated she had diarrhea (about 5-6 BM's) yesterday Will forward to [facility]in Clinic

## 2015-03-09 NOTE — Telephone Encounter (Signed)
Follow Up  [facility] nurse called back for the appointment.

## 2015-03-09 NOTE — Telephone Encounter (Signed)
Follow UP  RN from [facility] calling to speak w/ Kristen Whitehead concerning possible PT/INR and follow up for the pt in the office today 7/21. RN is at PT's home and can help facilitate. Please call back and discuss.

## 2015-03-09 NOTE — Telephone Encounter (Signed)
Result noted, will await venipuncture results.  Pt has not taken Coumadin dosage today, instructed pt to hold Coumadin dosage today and we will call her once results received from lab with further instructions on adjusting her current Coumadin regimen.  Advised pt to go to the ED with any bleeding problems.  Pt verbalized understanding.

## 2015-03-09 NOTE — Telephone Encounter (Signed)
Spoke with patient and she saw her PA at PCP yesterday PA called and spoke with Dr Lovena Le Per notes from [facility] and patient Dr Lovena Le recommended decreasing the Atenolol to once a day and holding Losartan  This am patients blood pressure 142/78 HR 69 prior to medications  Discussed with Dr Radford Pax DOD and patient needs to proceed with Dr Tanna Furry recommendations and call back Tuesday with update Advised patient, verbalized understanding

## 2015-03-09 NOTE — Telephone Encounter (Signed)
Follow Up  Pt calling to speak w/ [name] concerning previous note. Please call backl and discuss.

## 2015-03-10 NOTE — Telephone Encounter (Signed)
Agree with plans

## 2015-03-13 DIAGNOSIS — N8110 Cystocele, unspecified: Secondary | ICD-10-CM | POA: Diagnosis not present

## 2015-03-13 DIAGNOSIS — Z85850 Personal history of malignant neoplasm of thyroid: Secondary | ICD-10-CM | POA: Diagnosis not present

## 2015-03-13 DIAGNOSIS — I482 Chronic atrial fibrillation: Secondary | ICD-10-CM | POA: Diagnosis not present

## 2015-03-13 DIAGNOSIS — Q233 Congenital mitral insufficiency: Secondary | ICD-10-CM | POA: Diagnosis not present

## 2015-03-13 DIAGNOSIS — Z5181 Encounter for therapeutic drug level monitoring: Secondary | ICD-10-CM | POA: Diagnosis not present

## 2015-03-13 DIAGNOSIS — I341 Nonrheumatic mitral (valve) prolapse: Secondary | ICD-10-CM | POA: Diagnosis not present

## 2015-03-13 DIAGNOSIS — I10 Essential (primary) hypertension: Secondary | ICD-10-CM | POA: Diagnosis not present

## 2015-03-13 DIAGNOSIS — Z7901 Long term (current) use of anticoagulants: Secondary | ICD-10-CM | POA: Diagnosis not present

## 2015-03-13 DIAGNOSIS — I5032 Chronic diastolic (congestive) heart failure: Secondary | ICD-10-CM | POA: Diagnosis not present

## 2015-03-14 DIAGNOSIS — I482 Chronic atrial fibrillation: Secondary | ICD-10-CM | POA: Diagnosis not present

## 2015-03-14 DIAGNOSIS — I5032 Chronic diastolic (congestive) heart failure: Secondary | ICD-10-CM | POA: Diagnosis not present

## 2015-03-14 DIAGNOSIS — I341 Nonrheumatic mitral (valve) prolapse: Secondary | ICD-10-CM | POA: Diagnosis not present

## 2015-03-14 DIAGNOSIS — I10 Essential (primary) hypertension: Secondary | ICD-10-CM | POA: Diagnosis not present

## 2015-03-15 NOTE — Telephone Encounter (Signed)
Patient continues to have elevated blood pressure in the morning. Last 3 mornings patients blood pressure has been elevated 166/90, 175/99 and 181/80, both prior to taking morning medications Blood pressure does come down after medications, yesterday 113/72 Discussed with  [name] and will have her take her Imdur in the evening and ok to take her Levothyroxine and Atenolol together in am Advised patient, verbalized understanding

## 2015-03-15 NOTE — Telephone Encounter (Signed)
New Prob    Pt is calling regarding follow up for blood pressure and possibly getting in as she states was previously discussed. Please call.

## 2015-03-15 NOTE — Telephone Encounter (Signed)
New message  Anderson Malta with [facility] called states that there are two orders that were faxed over around 06/15 and 07/22 for verbal orders concerning the pt coumadin will fax another order at the same time. Please call back to discuss

## 2015-03-16 DIAGNOSIS — N8182 Incompetence or weakening of pubocervical tissue: Secondary | ICD-10-CM | POA: Diagnosis not present

## 2015-03-16 DIAGNOSIS — N390 Urinary tract infection, site not specified: Secondary | ICD-10-CM | POA: Diagnosis not present

## 2015-03-17 ENCOUNTER — Ambulatory Visit (INDEPENDENT_AMBULATORY_CARE_PROVIDER_SITE_OTHER): Payer: Commercial Managed Care - HMO | Admitting: Internal Medicine

## 2015-03-17 ENCOUNTER — Telehealth: Payer: Self-pay | Admitting: Cardiology

## 2015-03-17 DIAGNOSIS — Z7901 Long term (current) use of anticoagulants: Secondary | ICD-10-CM | POA: Diagnosis not present

## 2015-03-17 DIAGNOSIS — I10 Essential (primary) hypertension: Secondary | ICD-10-CM | POA: Diagnosis not present

## 2015-03-17 DIAGNOSIS — Q233 Congenital mitral insufficiency: Secondary | ICD-10-CM | POA: Diagnosis not present

## 2015-03-17 DIAGNOSIS — Z5181 Encounter for therapeutic drug level monitoring: Secondary | ICD-10-CM | POA: Diagnosis not present

## 2015-03-17 DIAGNOSIS — N8110 Cystocele, unspecified: Secondary | ICD-10-CM | POA: Diagnosis not present

## 2015-03-17 DIAGNOSIS — I482 Chronic atrial fibrillation, unspecified: Secondary | ICD-10-CM

## 2015-03-17 DIAGNOSIS — I341 Nonrheumatic mitral (valve) prolapse: Secondary | ICD-10-CM | POA: Diagnosis not present

## 2015-03-17 DIAGNOSIS — I5032 Chronic diastolic (congestive) heart failure: Secondary | ICD-10-CM | POA: Diagnosis not present

## 2015-03-17 DIAGNOSIS — Z85850 Personal history of malignant neoplasm of thyroid: Secondary | ICD-10-CM | POA: Diagnosis not present

## 2015-03-17 LAB — POCT INR: INR: 1.6

## 2015-03-17 NOTE — Telephone Encounter (Signed)
Result noted, see anticoagulation note in Epic. 

## 2015-03-17 NOTE — Telephone Encounter (Signed)
[facility] calling to report INR 1.6, PT 19.6 Call pt back directly to advise on coumadin and when to have labs rechecked.

## 2015-03-20 ENCOUNTER — Telehealth: Payer: Self-pay | Admitting: Cardiology

## 2015-03-20 NOTE — Telephone Encounter (Signed)
New message   Patient calling   Pt c/o BP issue: STAT if pt c/o blurred vision, one-sided weakness or slurred speech  1. What are your last 5 BP readings? Today 150/56, pulse 70 .   2. Are you having any other symptoms (ex. Dizziness, headache, blurred vision, passed out)? Gain 2 lb from Saturday 137 / Sunday 139  3. What is your BP issue? Patient wants to discuss

## 2015-03-20 NOTE — Telephone Encounter (Signed)
**Note De-Identified  Obfuscation** Per Truitt Merle, NP the pt is advised to continue to monitor her BP, record the readings and to call back in 1 week with readings if she continues to have concerns about her BP's. Also, the pt is advised that since she has only gained 2 lbs we will not make any changes to her therapy at this time but to continue to weigh daily and to call if she has increased weight by 3 lbs in a 24 hour period, SOB or if she has any swelling. She verbalized understanding and is in agreement with recommendations.  Will forward message to Dr Mare Ferrari and his nurse, [name], as Juluis Rainier.

## 2015-03-20 NOTE — Telephone Encounter (Signed)
The pt is calling to report her BP readings. They are as follows: 03/13/15  BP = 104/53 03/14/15  BP= 175/99 03/15/15  BP= 181/80 03/16/15  BP= 163/86 03/17/15  BP= 145/74 03/18/15  BP= 138/74 03/20/15    BP= 150/56 She reports that she checks her BP between 11 am and 12 pm daily and that she waits at least an hour after taking he morning medications before checking her BP. She states that she takes her Atenolol and Levothyroxine in the mornings as soon as she wakes up (states she wakes up at different times in the am). The pt says she started taking Cipro 250 mg BID on this past Thursday (7/28) and will continue to take for another 9 days (14 day antibiotic therapy) for a bladder infection. She is concerned with her BP readings and her weight gain of 2 lbs from Saturday to Sunday. She is advised that I will discuss with our DOD or Flex and call her back with recommendations.

## 2015-03-21 ENCOUNTER — Telehealth: Payer: Self-pay | Admitting: Cardiology

## 2015-03-21 DIAGNOSIS — Z5181 Encounter for therapeutic drug level monitoring: Secondary | ICD-10-CM | POA: Diagnosis not present

## 2015-03-21 DIAGNOSIS — Z85850 Personal history of malignant neoplasm of thyroid: Secondary | ICD-10-CM | POA: Diagnosis not present

## 2015-03-21 DIAGNOSIS — Z7901 Long term (current) use of anticoagulants: Secondary | ICD-10-CM | POA: Diagnosis not present

## 2015-03-21 DIAGNOSIS — I482 Chronic atrial fibrillation: Secondary | ICD-10-CM | POA: Diagnosis not present

## 2015-03-21 DIAGNOSIS — N8110 Cystocele, unspecified: Secondary | ICD-10-CM | POA: Diagnosis not present

## 2015-03-21 DIAGNOSIS — I10 Essential (primary) hypertension: Secondary | ICD-10-CM | POA: Diagnosis not present

## 2015-03-21 DIAGNOSIS — I341 Nonrheumatic mitral (valve) prolapse: Secondary | ICD-10-CM | POA: Diagnosis not present

## 2015-03-21 DIAGNOSIS — I5032 Chronic diastolic (congestive) heart failure: Secondary | ICD-10-CM | POA: Diagnosis not present

## 2015-03-21 DIAGNOSIS — Q233 Congenital mitral insufficiency: Secondary | ICD-10-CM | POA: Diagnosis not present

## 2015-03-21 NOTE — Telephone Encounter (Signed)
Spoke with Anderson Malta at [facility] and informed her that [name] was out of the office until 8/9.  Informed her that I did see some [facility] orders in his folder.  Anderson Malta verbalized understanding and was appreciative for call back.

## 2015-03-21 NOTE — Telephone Encounter (Signed)
New message     Kristen Whitehead has faxed 2 order. One from July 15th and one from July 22nd   Verbal orders have been done but they need the signed orders from the doctor for compliance and billing issues Please call to discuss

## 2015-03-23 DIAGNOSIS — Z85828 Personal history of other malignant neoplasm of skin: Secondary | ICD-10-CM | POA: Diagnosis not present

## 2015-03-23 DIAGNOSIS — D1801 Hemangioma of skin and subcutaneous tissue: Secondary | ICD-10-CM | POA: Diagnosis not present

## 2015-03-23 DIAGNOSIS — D0461 Carcinoma in situ of skin of right upper limb, including shoulder: Secondary | ICD-10-CM | POA: Diagnosis not present

## 2015-03-23 DIAGNOSIS — D225 Melanocytic nevi of trunk: Secondary | ICD-10-CM | POA: Diagnosis not present

## 2015-03-23 DIAGNOSIS — L814 Other melanin hyperpigmentation: Secondary | ICD-10-CM | POA: Diagnosis not present

## 2015-03-23 DIAGNOSIS — L720 Epidermal cyst: Secondary | ICD-10-CM | POA: Diagnosis not present

## 2015-03-23 DIAGNOSIS — D485 Neoplasm of uncertain behavior of skin: Secondary | ICD-10-CM | POA: Diagnosis not present

## 2015-03-23 DIAGNOSIS — L570 Actinic keratosis: Secondary | ICD-10-CM | POA: Diagnosis not present

## 2015-03-23 DIAGNOSIS — D2262 Melanocytic nevi of left upper limb, including shoulder: Secondary | ICD-10-CM | POA: Diagnosis not present

## 2015-03-23 DIAGNOSIS — D2261 Melanocytic nevi of right upper limb, including shoulder: Secondary | ICD-10-CM | POA: Diagnosis not present

## 2015-03-24 ENCOUNTER — Ambulatory Visit (INDEPENDENT_AMBULATORY_CARE_PROVIDER_SITE_OTHER): Payer: Commercial Managed Care - HMO | Admitting: Internal Medicine

## 2015-03-24 DIAGNOSIS — N8110 Cystocele, unspecified: Secondary | ICD-10-CM | POA: Diagnosis not present

## 2015-03-24 DIAGNOSIS — I341 Nonrheumatic mitral (valve) prolapse: Secondary | ICD-10-CM | POA: Diagnosis not present

## 2015-03-24 DIAGNOSIS — Z5181 Encounter for therapeutic drug level monitoring: Secondary | ICD-10-CM

## 2015-03-24 DIAGNOSIS — Z85850 Personal history of malignant neoplasm of thyroid: Secondary | ICD-10-CM | POA: Diagnosis not present

## 2015-03-24 DIAGNOSIS — I10 Essential (primary) hypertension: Secondary | ICD-10-CM | POA: Diagnosis not present

## 2015-03-24 DIAGNOSIS — Q233 Congenital mitral insufficiency: Secondary | ICD-10-CM | POA: Diagnosis not present

## 2015-03-24 DIAGNOSIS — I5032 Chronic diastolic (congestive) heart failure: Secondary | ICD-10-CM | POA: Diagnosis not present

## 2015-03-24 DIAGNOSIS — I482 Chronic atrial fibrillation, unspecified: Secondary | ICD-10-CM

## 2015-03-24 DIAGNOSIS — Z7901 Long term (current) use of anticoagulants: Secondary | ICD-10-CM | POA: Diagnosis not present

## 2015-03-24 LAB — POCT INR: INR: 2.4

## 2015-03-31 DIAGNOSIS — I341 Nonrheumatic mitral (valve) prolapse: Secondary | ICD-10-CM | POA: Diagnosis not present

## 2015-03-31 DIAGNOSIS — I482 Chronic atrial fibrillation: Secondary | ICD-10-CM | POA: Diagnosis not present

## 2015-03-31 DIAGNOSIS — Z5181 Encounter for therapeutic drug level monitoring: Secondary | ICD-10-CM | POA: Diagnosis not present

## 2015-03-31 DIAGNOSIS — Z85850 Personal history of malignant neoplasm of thyroid: Secondary | ICD-10-CM | POA: Diagnosis not present

## 2015-03-31 DIAGNOSIS — N8110 Cystocele, unspecified: Secondary | ICD-10-CM | POA: Diagnosis not present

## 2015-03-31 DIAGNOSIS — I5032 Chronic diastolic (congestive) heart failure: Secondary | ICD-10-CM | POA: Diagnosis not present

## 2015-03-31 DIAGNOSIS — I10 Essential (primary) hypertension: Secondary | ICD-10-CM | POA: Diagnosis not present

## 2015-03-31 DIAGNOSIS — Z7901 Long term (current) use of anticoagulants: Secondary | ICD-10-CM | POA: Diagnosis not present

## 2015-03-31 DIAGNOSIS — Q233 Congenital mitral insufficiency: Secondary | ICD-10-CM | POA: Diagnosis not present

## 2015-04-07 ENCOUNTER — Ambulatory Visit (INDEPENDENT_AMBULATORY_CARE_PROVIDER_SITE_OTHER): Payer: Commercial Managed Care - HMO | Admitting: Cardiovascular Disease

## 2015-04-07 DIAGNOSIS — I5032 Chronic diastolic (congestive) heart failure: Secondary | ICD-10-CM | POA: Diagnosis not present

## 2015-04-07 DIAGNOSIS — I10 Essential (primary) hypertension: Secondary | ICD-10-CM | POA: Diagnosis not present

## 2015-04-07 DIAGNOSIS — I482 Chronic atrial fibrillation, unspecified: Secondary | ICD-10-CM

## 2015-04-07 DIAGNOSIS — Z5181 Encounter for therapeutic drug level monitoring: Secondary | ICD-10-CM | POA: Diagnosis not present

## 2015-04-07 DIAGNOSIS — Z85850 Personal history of malignant neoplasm of thyroid: Secondary | ICD-10-CM | POA: Diagnosis not present

## 2015-04-07 DIAGNOSIS — I341 Nonrheumatic mitral (valve) prolapse: Secondary | ICD-10-CM | POA: Diagnosis not present

## 2015-04-07 DIAGNOSIS — N8110 Cystocele, unspecified: Secondary | ICD-10-CM | POA: Diagnosis not present

## 2015-04-07 DIAGNOSIS — Q233 Congenital mitral insufficiency: Secondary | ICD-10-CM | POA: Diagnosis not present

## 2015-04-07 DIAGNOSIS — Z7901 Long term (current) use of anticoagulants: Secondary | ICD-10-CM | POA: Diagnosis not present

## 2015-04-07 LAB — POCT INR: INR: 2.5

## 2015-04-13 DIAGNOSIS — I5032 Chronic diastolic (congestive) heart failure: Secondary | ICD-10-CM | POA: Diagnosis not present

## 2015-04-13 DIAGNOSIS — Q233 Congenital mitral insufficiency: Secondary | ICD-10-CM | POA: Diagnosis not present

## 2015-04-13 DIAGNOSIS — M899 Disorder of bone, unspecified: Secondary | ICD-10-CM | POA: Diagnosis not present

## 2015-04-13 DIAGNOSIS — Z0000 Encounter for general adult medical examination without abnormal findings: Secondary | ICD-10-CM | POA: Diagnosis not present

## 2015-04-13 DIAGNOSIS — I509 Heart failure, unspecified: Secondary | ICD-10-CM | POA: Diagnosis not present

## 2015-04-13 DIAGNOSIS — I482 Chronic atrial fibrillation: Secondary | ICD-10-CM | POA: Diagnosis not present

## 2015-04-13 DIAGNOSIS — Z7901 Long term (current) use of anticoagulants: Secondary | ICD-10-CM | POA: Diagnosis not present

## 2015-04-13 DIAGNOSIS — E785 Hyperlipidemia, unspecified: Secondary | ICD-10-CM | POA: Diagnosis not present

## 2015-04-13 DIAGNOSIS — Z85850 Personal history of malignant neoplasm of thyroid: Secondary | ICD-10-CM | POA: Diagnosis not present

## 2015-04-13 DIAGNOSIS — R8290 Unspecified abnormal findings in urine: Secondary | ICD-10-CM | POA: Diagnosis not present

## 2015-04-13 DIAGNOSIS — N8110 Cystocele, unspecified: Secondary | ICD-10-CM | POA: Diagnosis not present

## 2015-04-13 DIAGNOSIS — I10 Essential (primary) hypertension: Secondary | ICD-10-CM | POA: Diagnosis not present

## 2015-04-13 DIAGNOSIS — Z5181 Encounter for therapeutic drug level monitoring: Secondary | ICD-10-CM | POA: Diagnosis not present

## 2015-04-13 DIAGNOSIS — E890 Postprocedural hypothyroidism: Secondary | ICD-10-CM | POA: Diagnosis not present

## 2015-04-13 DIAGNOSIS — I341 Nonrheumatic mitral (valve) prolapse: Secondary | ICD-10-CM | POA: Diagnosis not present

## 2015-04-13 DIAGNOSIS — H3530 Unspecified macular degeneration: Secondary | ICD-10-CM | POA: Diagnosis not present

## 2015-04-14 ENCOUNTER — Emergency Department (HOSPITAL_COMMUNITY): Payer: Commercial Managed Care - HMO

## 2015-04-14 ENCOUNTER — Emergency Department (HOSPITAL_COMMUNITY)
Admission: EM | Admit: 2015-04-14 | Discharge: 2015-04-14 | Disposition: A | Discharge status: Home / Self Care | Payer: Commercial Managed Care - HMO | Attending: Emergency Medicine | Admitting: Emergency Medicine

## 2015-04-14 ENCOUNTER — Encounter (HOSPITAL_COMMUNITY): Payer: Self-pay | Admitting: Emergency Medicine

## 2015-04-14 DIAGNOSIS — E039 Hypothyroidism, unspecified: Secondary | ICD-10-CM | POA: Insufficient documentation

## 2015-04-14 DIAGNOSIS — Z7901 Long term (current) use of anticoagulants: Secondary | ICD-10-CM | POA: Insufficient documentation

## 2015-04-14 DIAGNOSIS — Y9289 Other specified places as the place of occurrence of the external cause: Secondary | ICD-10-CM | POA: Diagnosis not present

## 2015-04-14 DIAGNOSIS — R791 Abnormal coagulation profile: Secondary | ICD-10-CM | POA: Diagnosis not present

## 2015-04-14 DIAGNOSIS — Y9389 Activity, other specified: Secondary | ICD-10-CM | POA: Diagnosis not present

## 2015-04-14 DIAGNOSIS — S29001A Unspecified injury of muscle and tendon of front wall of thorax, initial encounter: Secondary | ICD-10-CM | POA: Diagnosis present

## 2015-04-14 DIAGNOSIS — E780 Pure hypercholesterolemia: Secondary | ICD-10-CM | POA: Insufficient documentation

## 2015-04-14 DIAGNOSIS — Z79899 Other long term (current) drug therapy: Secondary | ICD-10-CM | POA: Diagnosis not present

## 2015-04-14 DIAGNOSIS — S20212A Contusion of left front wall of thorax, initial encounter: Secondary | ICD-10-CM | POA: Diagnosis not present

## 2015-04-14 DIAGNOSIS — S0990XA Unspecified injury of head, initial encounter: Secondary | ICD-10-CM | POA: Diagnosis not present

## 2015-04-14 DIAGNOSIS — I482 Chronic atrial fibrillation: Secondary | ICD-10-CM | POA: Diagnosis not present

## 2015-04-14 DIAGNOSIS — Y998 Other external cause status: Secondary | ICD-10-CM | POA: Insufficient documentation

## 2015-04-14 DIAGNOSIS — Z859 Personal history of malignant neoplasm, unspecified: Secondary | ICD-10-CM | POA: Diagnosis not present

## 2015-04-14 DIAGNOSIS — S299XXA Unspecified injury of thorax, initial encounter: Secondary | ICD-10-CM | POA: Diagnosis not present

## 2015-04-14 DIAGNOSIS — S199XXA Unspecified injury of neck, initial encounter: Secondary | ICD-10-CM | POA: Diagnosis not present

## 2015-04-14 DIAGNOSIS — I10 Essential (primary) hypertension: Secondary | ICD-10-CM | POA: Diagnosis not present

## 2015-04-14 DIAGNOSIS — W1839XA Other fall on same level, initial encounter: Secondary | ICD-10-CM | POA: Insufficient documentation

## 2015-04-14 DIAGNOSIS — Z8719 Personal history of other diseases of the digestive system: Secondary | ICD-10-CM | POA: Insufficient documentation

## 2015-04-14 DIAGNOSIS — I5032 Chronic diastolic (congestive) heart failure: Secondary | ICD-10-CM | POA: Diagnosis not present

## 2015-04-14 DIAGNOSIS — W19XXXA Unspecified fall, initial encounter: Secondary | ICD-10-CM

## 2015-04-14 DIAGNOSIS — R51 Headache: Secondary | ICD-10-CM | POA: Diagnosis not present

## 2015-04-14 DIAGNOSIS — R0781 Pleurodynia: Secondary | ICD-10-CM | POA: Diagnosis not present

## 2015-04-14 LAB — I-STAT CHEM 8, ED
BUN: 10 mg/dL (ref 6–20)
CALCIUM ION: 1.1 mmol/L — AB (ref 1.13–1.30)
Chloride: 95 mmol/L — ABNORMAL LOW (ref 101–111)
Creatinine, Ser: 0.8 mg/dL (ref 0.44–1.00)
Glucose, Bld: 101 mg/dL — ABNORMAL HIGH (ref 65–99)
HEMATOCRIT: 47 % — AB (ref 36.0–46.0)
HEMOGLOBIN: 16 g/dL — AB (ref 12.0–15.0)
Potassium: 4.7 mmol/L (ref 3.5–5.1)
SODIUM: 136 mmol/L (ref 135–145)
TCO2: 28 mmol/L (ref 0–100)

## 2015-04-14 LAB — PROTIME-INR
INR: 3.37 — AB (ref 0.00–1.49)
Prothrombin Time: 33.4 seconds — ABNORMAL HIGH (ref 11.6–15.2)

## 2015-04-14 MED ORDER — ONDANSETRON 4 MG PO TBDP
4.0000 mg | ORAL_TABLET | Freq: Once | ORAL | Status: AC
  Administered 2015-04-14: 4 mg via ORAL
  Filled 2015-04-14: qty 1

## 2015-04-14 MED ORDER — HYDROCODONE-ACETAMINOPHEN 5-325 MG PO TABS: 1.0000 | ORAL_TABLET | ORAL | Status: DC | PRN

## 2015-04-14 MED ORDER — HYDROCODONE-ACETAMINOPHEN 5-325 MG PO TABS
2.0000 | ORAL_TABLET | Freq: Once | ORAL | Status: AC
  Administered 2015-04-14: 2 via ORAL
  Filled 2015-04-14: qty 2

## 2015-04-14 NOTE — ED Notes (Signed)
Pt info me she felt fine walking to and from restroom

## 2015-04-14 NOTE — ED Notes (Signed)
Patient not in room

## 2015-04-14 NOTE — ED Provider Notes (Signed)
CSN: [id number]     Arrival date & time 04/14/15  1132 History   First MD Initiated Contact with Patient 04/14/15 1142     Chief Complaint  Patient presents with  . Fall  . rib cage      (Consider location/radiation/quality/duration/timing/severity/associated sxs/prior Treatment) HPI  Kristen Whitehead is a(n) 81 y.o. female who presents to the ED with cc of rib pain. Patient fell against a door 5 days ago. She c/o left rib pain which is worse with mvmt and inhalation. She denies hemoptysis. She states that she has had difficulty taking deep breaths. Patient also c/o headache which she describes as intermittent, located in the occiput and on the right lower parietal region. She has associated nausea. She denies visual disturbances. Patient states that she feels her mentation is off. Patient is on a blood thinner currently and didn't hit her head on the left side during the fall. She denies neck pain. Past Medical History  Diagnosis Date  . History of mitral valve prolapse 06/15/2008    a. echo 1/14: mild LVH, EF 60-65%, mild MR, mild to mod BAE, PASP 35  . Chronic atrial fibrillation     managed with rate control and coumadin  . History of congenital mitral regurgitation     mild  . Hypothyroidism   . Hypercholesterolemia   . GERD (gastroesophageal reflux disease)   . Chest pain     no known ischemic heart disease; negative Myoview July 2013. EF 75% with no ischemia.   . Hypertension   . Bladder prolapse, female, acquired   . Chronic diastolic heart failure   . Chronic anticoagulation   . Heart disease   . Cancer     thyroid   Past Surgical History  Procedure Laterality Date  . Thyroidectomy    . Cholecystectomy    . Abdominal hysterectomy    . Cataract extraction     Family History  Problem Relation Age of Onset  . Heart attack Neg Hx   . Heart disease Neg Hx   . Stroke Mother   . Stroke Father    Social History  Substance Use Topics  . Smoking status: Never  Smoker   . Smokeless tobacco: Never Used  . Alcohol Use: No   OB History    No data available     Review of Systems  Ten systems reviewed and are negative for acute change, except as noted in the HPI.    Allergies  Azithromycin; Compazine; Demerol; Dolophine; Ebastine; Erythromycin; Ibuprofen; Statins; Tetanus toxoids; and Tetracyclines & related  Home Medications   Prior to Admission medications   Medication Sig Start Date End Date Taking? Authorizing Provider  atenolol (TENORMIN) 25 MG tablet Take by mouth daily.    Historical Provider, MD  beta carotene w/minerals (OCUVITE) tablet Take 1 tablet by mouth daily.    Historical Provider, MD  calcium carbonate (OS-CAL - DOSED IN MG OF ELEMENTAL CALCIUM) 1250 MG tablet Take 1 tablet by mouth 2 (two) times daily.     Historical Provider, MD  Cholecalciferol (VITAMIN D) 2000 UNITS tablet Take 2,000 Units by mouth daily.     Historical Provider, MD  clobetasol ointment (TEMOVATE) 7.49 % Apply 1 application topically daily as needed. For skin irritation 12/09/14   Historical Provider, MD  estradiol (ESTRACE VAGINAL) 0.1 MG/GM vaginal cream Place 2 g vaginally once a week.     Historical Provider, MD  furosemide (LASIX) 20 MG tablet Take 2 tablets by mouth  in the morning and 1 tablet in the evening 01/18/15   Darlin Coco, MD  isosorbide mononitrate (IMDUR) 30 MG 24 hr tablet TAKE 1 TABLET EVERY DAY Patient taking differently: TAKE 1 TABLET EVERY DAY IN THE EVENING 02/24/15   Darlin Coco, MD  latanoprost (XALATAN) 0.005 % ophthalmic solution Place 1 drop into the left eye at bedtime.  02/23/13   Historical Provider, MD  levothyroxine (SYNTHROID, LEVOTHROID) 88 MCG tablet Take 88 mcg by mouth daily before breakfast.  10/24/14   Historical Provider, MD  Magnesium 250 MG TABS Take 250 mg by mouth at bedtime.    Historical Provider, MD  Magnesium 500 MG TABS Take 500 mg by mouth at bedtime.    Historical Provider, MD  NITROSTAT 0.4 MG SL tablet  DISSOLVE 1 TABLET UNDER THE TONGUE EVERY 5 MINUTES FOR 3 DOSES AS NEEDED  FOR  CHEST  PAIN    Darlin Coco, MD  potassium chloride (K-DUR,KLOR-CON) 10 MEQ tablet Take 20 mEq by mouth daily.    Historical Provider, MD  pramipexole (MIRAPEX) 0.125 MG tablet Take 1 tablet (0.125 mg total) by mouth every evening. 07/21/14   Star Age, MD  RESTASIS 0.05 % ophthalmic emulsion Place 1 drop into both eyes 2 (two) times daily.  07/29/14   Historical Provider, MD  temazepam (RESTORIL) 15 MG capsule Take 15 mg by mouth at bedtime.    Historical Provider, MD  vitamin C (ASCORBIC ACID) 500 MG tablet Take 500 mg by mouth daily.    Historical Provider, MD  warfarin (COUMADIN) 5 MG tablet TAKE AS DIRECTED BY ANTICOAGULATION CLINIC 12/16/14   Darlin Coco, MD   BP 123/66 mmHg  Pulse 85  Temp(Src) 98.3 F (36.8 C) (Oral)  Resp 18  Ht 5' (1.524 m)  Wt 140 lb (63.504 kg)  BMI 27.34 kg/m2  SpO2 97% Physical Exam  Constitutional: She is oriented to person, place, and time. She appears well-developed and well-nourished. No distress.  HENT:  Head: Normocephalic and atraumatic.    Eyes: Conjunctivae are normal. No scleral icterus.  Neck: Normal range of motion.  Cardiovascular: Normal rate, regular rhythm and normal heart sounds.  Exam reveals no gallop and no friction rub.   No murmur heard. Pulmonary/Chest: Effort normal and breath sounds normal. No respiratory distress.    Abdominal: Soft. Bowel sounds are normal. She exhibits no distension and no mass. There is no tenderness. There is no guarding.  Musculoskeletal:       Arms: Neurological: She is alert and oriented to person, place, and time.  Skin: Skin is warm and dry. She is not diaphoretic.  Nursing note and vitals reviewed.   ED Course  Procedures (including critical care time) Labs Review Labs Reviewed - No data to display  Imaging Review No results found. I have personally reviewed and evaluated these images and lab results as  part of my medical decision-making.   EKG Interpretation None      MDM   Final diagnoses:  Fall  Rib contusion, left, initial encounter  Supratherapeutic INR   BP 116/49 mmHg  Pulse 72  Temp(Src) 98 F (36.7 C) (Oral)  Resp 18  Ht 5' (1.524 m)  Wt 140 lb (63.504 kg)  BMI 27.34 kg/m2  SpO2 94% Patient with out fracture or intracranial abnormalitiy on imaging. Pain treated in the ED. She is able to ambulate. Definitive Fracture Care:  Definitive fracture care performred for the RIB.  This included analgesia in the ED, incentive spiromoter which has  been provided.  I have counseled the pt on possible complications of the fractures and signs and symptoms which would mandate return for further care.  +supratherapeutic INR. She will need to hold the next to doses. Patient seen in shared visit with attending physician. The patient appears reasonably screened and/or stabilized for discharge and I doubt any other medical condition or other [facility] requiring further screening, evaluation, or treatment in the ED at this time prior to discharge.        Margarita Mail, PA-C 04/14/15 [place], MD 04/14/15 [phone]

## 2015-04-14 NOTE — ED Notes (Signed)
Pt states that she fell against door on Monday and has large bruise on posterior left upper arm. Pt c/o rib cage pain on left side.  Pt had physical yesterday and PCP today pt to go imaging for xray for possible rib fracture, then this am pt headache as well so PCP told her to come to ED for further eval.

## 2015-04-14 NOTE — Discharge Instructions (Signed)
Your INR (coumadin level) is above the normal level (today was 3.37) Please skip the next 2 doses of your coumadin.  ).   Rib Contusion A rib contusion (bruise) can occur by a blow to the chest or by a fall against a hard object. Usually these will be much better in a couple weeks. If X-rays were taken today and there are no broken bones (fractures), the diagnosis of bruising is made. However, broken ribs may not show up for several days, or may be discovered later on a routine X-ray when signs of healing show up. If this happens to you, it does not mean that something was missed on the X-ray, but simply that it did not show up on the first X-rays. Earlier diagnosis will not usually change the treatment. HOME CARE INSTRUCTIONS   Avoid strenuous activity. Be careful during activities and avoid bumping the injured ribs. Activities that pull on the injured ribs and cause pain should be avoided, if possible.  For the first day or two, an ice pack used every 20 minutes while awake may be helpful. Put ice in a plastic bag and put a towel between the bag and the skin.  Eat a normal, well-balanced diet. Drink plenty of fluids to avoid constipation.  Take deep breaths several times a day to keep lungs free of infection. Try to cough several times a day. Splint the injured area with a pillow while coughing to ease pain. Coughing can help prevent pneumonia.  Wear a rib belt or binder only if told to do so by your caregiver. If you are wearing a rib belt or binder, you must do the breathing exercises as directed by your caregiver. If not used properly, rib belts or binders restrict breathing which can lead to pneumonia.  Only take over-the-counter or prescription medicines for pain, discomfort, or fever as directed by your caregiver. SEEK MEDICAL CARE IF:   You or your child has an oral temperature above 102 F (38.9 C).  Your baby is older than 3 months with a rectal temperature of 100.5 F (38.1 C)  or higher for more than 1 day.  You develop a cough, with thick or bloody sputum. SEEK IMMEDIATE MEDICAL CARE IF:   You have difficulty breathing.  You feel sick to your stomach (nausea), have vomiting or belly (abdominal) pain.  You have worsening pain, not controlled with medications, or there is a change in the location of the pain.  You develop sweating or radiation of the pain into the arms, jaw or shoulders, or become light headed or faint.  You or your child has an oral temperature above 102 F (38.9 C), not controlled by medicine.  Your or your baby is older than 3 months with a rectal temperature of 102 F (38.9 C) or higher.  Your baby is 44 months old or younger with a rectal temperature of 100.4 F (38 C) or higher. MAKE SURE YOU:   Understand these instructions.  Will watch your condition.  Will get help right away if you are not doing well or get worse. Document Released: 04/30/2001 Document Revised: 11/30/2012 Document Reviewed: 03/23/2008 [facility] Patient Information 2015 [place], [place]. This information is not intended to replace advice given to you by your health care provider. Make sure you discuss any questions you have with your health care provider.  Warfarin: What You Need to Know Warfarin is an anticoagulant. Anticoagulants help prevent the formation of blood clots. They also help stop the growth of  blood clots. Warfarin is sometimes referred to as a "blood thinner."  Normally, when body tissues are cut or damaged, the blood clots in order to prevent blood loss. Sometimes clots form inside your blood vessels and obstruct the flow of blood through your circulatory system (thrombosis). These clots may travel through your bloodstream and become lodged in smaller blood vessels in your brain, which can cause a stroke, or in your lungs (pulmonary embolism). WHO SHOULD USE WARFARIN? Warfarin is prescribed for people at risk of developing harmful blood  clots:  People with surgically implanted mechanical heart valves, irregular heart rhythms called atrial fibrillation, and certain clotting disorders.  People who have developed harmful blood clotting in the past, including those who have had a stroke or a pulmonary embolism, or thrombosis in their legs (deep vein thrombosis [DVT]).  People with an existing blood clot, such as a pulmonary embolism. WARFARIN DOSING Warfarin tablets come in different strengths. Each tablet strength is a different color, with the amount of warfarin (in milligrams) clearly printed on the tablet. If the color of your tablet is different than usual when you receive a new prescription, report it immediately to your pharmacist or health care provider. WARFARIN MONITORING The goal of warfarin therapy is to lessen the clotting tendency of blood but not prevent clotting completely. Your health care provider will monitor the anticoagulation effect of warfarin closely and adjust your dose as needed. For your safety, blood tests called prothrombin time (PT) or international normalized ratio (INR) are used to measure the effects of warfarin. Both of these tests can be done with a finger stick or a blood draw. The longer it takes the blood to clot, the higher the PT or INR. Your health care provider will inform you of your "target" PT or INR range. If, at any time, your PT or INR is above the target range, there is a risk of bleeding. If your PT or INR is below the target range, there is a risk of clotting. Whether you are started on warfarin while you are in the hospital or in your health care provider's office, you will need to have your PT or INR checked within one week of starting the medicine. Initially, some people are asked to have their PT or INR checked as much as twice a week. Once you are on a stable maintenance dose, the PT or INR is checked less often, usually once every 2 to 4 weeks. The warfarin dose may be adjusted if  the PT or INR is not within the target range. It is important to keep all laboratory and health care provider follow-up appointments. Not keeping appointments could result in a chronic or permanent injury, pain, or disability because warfarin is a medicine that requires close monitoring. WHAT ARE THE SIDE EFFECTS OF WARFARIN?  Too much warfarin can cause bleeding (hemorrhage) from any part of the body. This may include bleeding from the gums, blood in the urine, bloody or dark stools, a nosebleed that is not easily stopped, coughing up blood, or vomiting blood.  Too little warfarin can increase the risk of blood clots.  Too little or too much warfarin can also increase the risk of a stroke.  Warfarin use may cause a skin rash or irritation, an unusual fever, continual nausea or stomach upset, or severe pain in your joints or back. SPECIAL PRECAUTIONS WHILE TAKING WARFARIN Warfarin should be taken exactly as directed. It is very important to take warfarin as directed since bleeding or  blood clots could result in chronic or permanent injury, pain, or disability.  Take your medicine at the same time every day. If you forget to take your dose, you can take it if it is within 6 hours of when it was due.  Do not change the dose of warfarin on your own to make up for missed or extra doses.  If you miss more than 2 doses in a row, you should contact your health care provider for advice. Avoid situations that cause bleeding. You may have a tendency to bleed more easily than usual while taking warfarin. The following actions can limit bleeding:  Using a softer toothbrush.  Flossing with waxed floss rather than unwaxed floss.  Shaving with an Copy rather than a blade.  Limiting the use of sharp objects.  Avoiding potentially harmful activities, such as contact sports. Warfarin and Pregnancy or Breastfeeding  Warfarin is not advised during the first trimester of pregnancy due to an  increased risk of birth defects. In certain situations, a woman may take warfarin after her first trimester of pregnancy. A woman who becomes pregnant or plans to become pregnant while taking warfarin should notify her health care provider immediately.  Although warfarin does not pass into breast milk, a woman who wishes to breastfeed while taking warfarin should also consult with her health care provider. Alcohol, Smoking, and Illicit Drug Use  Alcohol affects how warfarin works in the body. It is best to avoid alcoholic drinks or consume very small amounts while taking warfarin. In general, alcohol intake should be limited to 1 oz (30 mL) of liquor, 6 oz (180 mL) of wine, or 12 oz (360 mL) of beer each day. Notify your health care provider if you change your alcohol intake.  Smoking affects how warfarin works. It is best to avoid smoking while taking warfarin. Notify your health care provider if you change your smoking habits.  It is best to avoid all illicit drugs while taking warfarin since there are few studies that show how warfarin interacts with these drugs. Other Medicines and Dietary Supplements Many prescription and over-the-counter medicines can interfere with warfarin. Be sure all of your health care providers know you are taking warfarin. Notify your health care provider who prescribed warfarin for you or your pharmacist before starting or stopping any new medicines, including over-the-counter vitamins, dietary supplements, and pain medicines. Your warfarin dose may need to be adjusted. Some common over-the-counter medicines that may increase the risk of bleeding while taking warfarin include:   Acetaminophen.  Aspirin.  Nonsteroidal anti-inflammatory medicines (NSAIDs), such as ibuprofen or naproxen.  Vitamin E. Dietary Considerations  Foods that have moderate or high amounts of vitamin K can interfere with warfarin. Avoid major changes in your diet or notify your health care  provider before changing your diet. Eat a consistent amount of foods that have moderate or high amounts of vitamin K. Eating less foods containing vitamin K can increase the risk of bleeding. Eating more foods containing vitamin K can increase the risk of blood clots. Additional questions about dietary considerations can be discussed with a dietitian. Foods that are very high in vitamin K:  Greens, such as Swiss chard and beet, collard, mustard, or turnip greens (fresh or frozen, cooked).  Kale (fresh or frozen, cooked).  Parsley (raw).  Spinach (cooked). Foods that are high in vitamin K:  Asparagus (frozen, cooked).  Beans, green (frozen, cooked).  Broccoli.  Bok choy (cooked).  Brussels sprouts (fresh or frozen,  cooked).  Cabbage (cooked).   Coleslaw. Foods that are moderately high in vitamin K:  Blueberries.  Black-eyed peas.  Endive (raw).  Green leaf lettuce (raw).  Green scallions (raw).  Kale (raw).  Okra (frozen, cooked).  Plantains (fried).  Romaine lettuce (raw).  Sauerkraut (canned).  Spinach (raw). CALL YOUR CLINIC OR HEALTH CARE PROVIDER IF YOU:  Plan to have any surgery or procedure.  Feel sick, especially if you have diarrhea or vomiting.  Experience or anticipate any major changes in your diet.  Start or stop a prescription or over-the-counter medicine.  Become, plan to become, or think you may be pregnant.  Are having heavier than usual menstrual periods.  Have had a fall, accident, or any symptoms of bleeding or unusual bruising.  Develop an unusual fever. CALL 911 IN THE [place] OR GO TO THE EMERGENCY DEPARTMENT IF YOU:   Think you may be having an allergic reaction to warfarin. The signs of an allergic reaction could include itching, rash, hives, swelling, chest tightness, or trouble breathing.  See signs of blood in your urine. The signs could include reddish, pinkish, or tea-colored urine.  See signs of blood in your stools.  The signs could include bright red or black stools.  Vomit or cough up blood. In these instances, the blood could have either a bright red or a "coffee-grounds" appearance.  Have bleeding that will not stop after applying pressure for 30 minutes such as cuts, nosebleeds, or other injuries.  Have severe pain in your joints or back.  Have a new and severe headache.  Have sudden weakness or numbness of your face, arm, or leg, especially on one side of your body.  Have sudden confusion or trouble understanding.  Have sudden trouble seeing in one or both eyes.  Have sudden trouble walking, dizziness, loss of balance, or coordination.  Have trouble speaking or understanding (aphasia). Document Released: 08/05/2005 Document Revised: 12/20/2013 Document Reviewed: 01/29/2013 [facility] Patient Information 2015 [place], [place]. This information is not intended to replace advice given to you by your health care provider. Make sure you discuss any questions you have with your health care provider.

## 2015-04-18 ENCOUNTER — Telehealth: Payer: Self-pay | Admitting: Cardiology

## 2015-04-18 DIAGNOSIS — I10 Essential (primary) hypertension: Secondary | ICD-10-CM | POA: Diagnosis not present

## 2015-04-18 DIAGNOSIS — I482 Chronic atrial fibrillation: Secondary | ICD-10-CM | POA: Diagnosis not present

## 2015-04-18 DIAGNOSIS — I5032 Chronic diastolic (congestive) heart failure: Secondary | ICD-10-CM | POA: Diagnosis not present

## 2015-04-18 DIAGNOSIS — Z5181 Encounter for therapeutic drug level monitoring: Secondary | ICD-10-CM | POA: Diagnosis not present

## 2015-04-18 DIAGNOSIS — N8110 Cystocele, unspecified: Secondary | ICD-10-CM | POA: Diagnosis not present

## 2015-04-18 DIAGNOSIS — Z7901 Long term (current) use of anticoagulants: Secondary | ICD-10-CM | POA: Diagnosis not present

## 2015-04-18 DIAGNOSIS — I341 Nonrheumatic mitral (valve) prolapse: Secondary | ICD-10-CM | POA: Diagnosis not present

## 2015-04-18 DIAGNOSIS — Q233 Congenital mitral insufficiency: Secondary | ICD-10-CM | POA: Diagnosis not present

## 2015-04-18 DIAGNOSIS — Z85850 Personal history of malignant neoplasm of thyroid: Secondary | ICD-10-CM | POA: Diagnosis not present

## 2015-04-18 NOTE — Telephone Encounter (Signed)
New Message       Pt weighed 137.6 on Sunday and weighed 145 lbs on Mon and Tuesday missed her mid day Lasix yesterday she is having sob and her abdomen is feeling very tight.BP 136/82 today Oxygen 94% Please call back and advise.

## 2015-04-18 NOTE — Telephone Encounter (Signed)
Patient states she is feeling better today and shortness of breath better, no abdominal tightness  Per patient weight on her scales not consistent with the [facility] scales but has been until now. Her scales weight only up a few pounds Patient did eat [place] for lunch today  Discussed with  [name] and will have her take an extra today, continue to monitor. Call back Thursday if no improvement

## 2015-04-19 ENCOUNTER — Other Ambulatory Visit: Payer: Self-pay | Admitting: Cardiology

## 2015-04-20 ENCOUNTER — Telehealth: Payer: Self-pay | Admitting: Cardiology

## 2015-04-20 NOTE — Telephone Encounter (Signed)
Spoke with patient and she does have some tightness her her abdomen by the end of the day Patient denies any swelling in legs Discussed with  [name] and will have patient take lasix 2 in the morning and 2 in the afternoon Call back next week with update Advised patient ,verbalized understanding

## 2015-04-20 NOTE — Addendum Note (Signed)
Addended by: Alvina Filbert B on: 04/20/2015 06:04 PM   Modules accepted: Orders, Medications

## 2015-04-20 NOTE — Telephone Encounter (Signed)
New message      Today patient weighs 144 lbs.  Melinda please call

## 2015-04-25 DIAGNOSIS — N8189 Other female genital prolapse: Secondary | ICD-10-CM | POA: Diagnosis not present

## 2015-04-27 ENCOUNTER — Ambulatory Visit (INDEPENDENT_AMBULATORY_CARE_PROVIDER_SITE_OTHER): Payer: Commercial Managed Care - HMO | Admitting: Pharmacist

## 2015-04-27 DIAGNOSIS — I482 Chronic atrial fibrillation, unspecified: Secondary | ICD-10-CM

## 2015-04-27 DIAGNOSIS — Z85850 Personal history of malignant neoplasm of thyroid: Secondary | ICD-10-CM | POA: Diagnosis not present

## 2015-04-27 DIAGNOSIS — N8110 Cystocele, unspecified: Secondary | ICD-10-CM | POA: Diagnosis not present

## 2015-04-27 DIAGNOSIS — Q233 Congenital mitral insufficiency: Secondary | ICD-10-CM | POA: Diagnosis not present

## 2015-04-27 DIAGNOSIS — Z7901 Long term (current) use of anticoagulants: Secondary | ICD-10-CM | POA: Diagnosis not present

## 2015-04-27 DIAGNOSIS — I341 Nonrheumatic mitral (valve) prolapse: Secondary | ICD-10-CM | POA: Diagnosis not present

## 2015-04-27 DIAGNOSIS — Z5181 Encounter for therapeutic drug level monitoring: Secondary | ICD-10-CM

## 2015-04-27 DIAGNOSIS — I5032 Chronic diastolic (congestive) heart failure: Secondary | ICD-10-CM | POA: Diagnosis not present

## 2015-04-27 DIAGNOSIS — I10 Essential (primary) hypertension: Secondary | ICD-10-CM | POA: Diagnosis not present

## 2015-04-27 LAB — POCT INR: INR: 2.4

## 2015-04-29 ENCOUNTER — Other Ambulatory Visit: Payer: Self-pay | Admitting: Cardiology

## 2015-05-11 ENCOUNTER — Ambulatory Visit (INDEPENDENT_AMBULATORY_CARE_PROVIDER_SITE_OTHER): Payer: Commercial Managed Care - HMO | Admitting: Cardiovascular Disease

## 2015-05-11 DIAGNOSIS — I482 Chronic atrial fibrillation, unspecified: Secondary | ICD-10-CM

## 2015-05-11 DIAGNOSIS — I5032 Chronic diastolic (congestive) heart failure: Secondary | ICD-10-CM | POA: Diagnosis not present

## 2015-05-11 DIAGNOSIS — Z85850 Personal history of malignant neoplasm of thyroid: Secondary | ICD-10-CM | POA: Diagnosis not present

## 2015-05-11 DIAGNOSIS — Z7901 Long term (current) use of anticoagulants: Secondary | ICD-10-CM | POA: Diagnosis not present

## 2015-05-11 DIAGNOSIS — I10 Essential (primary) hypertension: Secondary | ICD-10-CM | POA: Diagnosis not present

## 2015-05-11 DIAGNOSIS — N8110 Cystocele, unspecified: Secondary | ICD-10-CM | POA: Diagnosis not present

## 2015-05-11 DIAGNOSIS — Z5181 Encounter for therapeutic drug level monitoring: Secondary | ICD-10-CM

## 2015-05-11 DIAGNOSIS — Q233 Congenital mitral insufficiency: Secondary | ICD-10-CM | POA: Diagnosis not present

## 2015-05-11 DIAGNOSIS — I341 Nonrheumatic mitral (valve) prolapse: Secondary | ICD-10-CM | POA: Diagnosis not present

## 2015-05-11 LAB — POCT INR: INR: 3.3

## 2015-05-12 ENCOUNTER — Telehealth: Payer: Self-pay | Admitting: Cardiology

## 2015-05-12 ENCOUNTER — Ambulatory Visit (INDEPENDENT_AMBULATORY_CARE_PROVIDER_SITE_OTHER): Payer: Commercial Managed Care - HMO | Admitting: Ophthalmology

## 2015-05-12 DIAGNOSIS — H35342 Macular cyst, hole, or pseudohole, left eye: Secondary | ICD-10-CM

## 2015-05-12 DIAGNOSIS — H35033 Hypertensive retinopathy, bilateral: Secondary | ICD-10-CM

## 2015-05-12 DIAGNOSIS — H3531 Nonexudative age-related macular degeneration: Secondary | ICD-10-CM

## 2015-05-12 DIAGNOSIS — H43813 Vitreous degeneration, bilateral: Secondary | ICD-10-CM | POA: Diagnosis not present

## 2015-05-12 DIAGNOSIS — I10 Essential (primary) hypertension: Secondary | ICD-10-CM | POA: Diagnosis not present

## 2015-05-12 NOTE — Telephone Encounter (Signed)
Kristen Whitehead calling to get re-certification for CHF management /med teaching for once every 2 weeks for the next 60 days-needs verbal order at [phone]

## 2015-05-12 NOTE — Telephone Encounter (Signed)
Left message ok to recert

## 2015-05-15 DIAGNOSIS — Q233 Congenital mitral insufficiency: Secondary | ICD-10-CM | POA: Diagnosis not present

## 2015-05-15 DIAGNOSIS — I5032 Chronic diastolic (congestive) heart failure: Secondary | ICD-10-CM | POA: Diagnosis not present

## 2015-05-15 DIAGNOSIS — N8110 Cystocele, unspecified: Secondary | ICD-10-CM | POA: Diagnosis not present

## 2015-05-15 DIAGNOSIS — Z85850 Personal history of malignant neoplasm of thyroid: Secondary | ICD-10-CM | POA: Diagnosis not present

## 2015-05-15 DIAGNOSIS — I341 Nonrheumatic mitral (valve) prolapse: Secondary | ICD-10-CM | POA: Diagnosis not present

## 2015-05-15 DIAGNOSIS — Z7901 Long term (current) use of anticoagulants: Secondary | ICD-10-CM | POA: Diagnosis not present

## 2015-05-15 DIAGNOSIS — I482 Chronic atrial fibrillation: Secondary | ICD-10-CM | POA: Diagnosis not present

## 2015-05-15 DIAGNOSIS — Z5181 Encounter for therapeutic drug level monitoring: Secondary | ICD-10-CM | POA: Diagnosis not present

## 2015-05-15 DIAGNOSIS — I10 Essential (primary) hypertension: Secondary | ICD-10-CM | POA: Diagnosis not present

## 2015-05-15 NOTE — Telephone Encounter (Signed)
Agree 

## 2015-05-16 ENCOUNTER — Telehealth: Payer: Self-pay | Admitting: Cardiology

## 2015-05-16 NOTE — Telephone Encounter (Signed)
Patient states she is feeling good No visible swelling in feet or legs Does thinks she is a little shortness of breath  Blood pressure 125/78 this am Patient only taking Lasix 20 mg 2 in the morning and 1 in the evening Advised to take an extra tablet tonight and tomorrow night, call back Thursday with update  Patient verbalized understanding

## 2015-05-16 NOTE — Telephone Encounter (Signed)
Agree with plan 

## 2015-05-16 NOTE — Telephone Encounter (Signed)
New Message  Pt requested to speak w/ RN- pt stated she gained 5 lbs overnight. Please call back and discuss.

## 2015-05-18 ENCOUNTER — Telehealth: Payer: Self-pay | Admitting: Cardiology

## 2015-05-18 DIAGNOSIS — L659 Nonscarring hair loss, unspecified: Secondary | ICD-10-CM | POA: Diagnosis not present

## 2015-05-18 DIAGNOSIS — D0461 Carcinoma in situ of skin of right upper limb, including shoulder: Secondary | ICD-10-CM | POA: Diagnosis not present

## 2015-05-18 DIAGNOSIS — Z85828 Personal history of other malignant neoplasm of skin: Secondary | ICD-10-CM | POA: Diagnosis not present

## 2015-05-18 NOTE — Telephone Encounter (Signed)
New message  ° ° °Patient calling back to speak with nurse  °

## 2015-05-18 NOTE — Telephone Encounter (Signed)
Spoke with patient and her weight has improved on Lasix 20 mg 2 twice a day but still up about 8 pounds from baseline Advised patient to continue Lasix 20 mg 2 twice a day and scheduled ov for next week per  [name]

## 2015-05-22 DIAGNOSIS — N8182 Incompetence or weakening of pubocervical tissue: Secondary | ICD-10-CM | POA: Diagnosis not present

## 2015-05-23 ENCOUNTER — Other Ambulatory Visit: Payer: Self-pay

## 2015-05-24 ENCOUNTER — Ambulatory Visit (INDEPENDENT_AMBULATORY_CARE_PROVIDER_SITE_OTHER): Payer: Commercial Managed Care - HMO | Admitting: Cardiology

## 2015-05-24 ENCOUNTER — Encounter: Payer: Self-pay | Admitting: Cardiology

## 2015-05-24 VITALS — BP 130/74 | HR 75 | Ht 60.0 in | Wt 140.1 lb

## 2015-05-24 DIAGNOSIS — I119 Hypertensive heart disease without heart failure: Secondary | ICD-10-CM

## 2015-05-24 DIAGNOSIS — I340 Nonrheumatic mitral (valve) insufficiency: Secondary | ICD-10-CM

## 2015-05-24 DIAGNOSIS — I482 Chronic atrial fibrillation, unspecified: Secondary | ICD-10-CM

## 2015-05-24 DIAGNOSIS — I5032 Chronic diastolic (congestive) heart failure: Secondary | ICD-10-CM | POA: Diagnosis not present

## 2015-05-24 NOTE — Progress Notes (Signed)
Cardiology Office Note   Date:  05/24/2015   ID:  Kaiah, Hosea 27-Feb-1923, MRN [id number]  PCP:  Gennette Pac, MD  Cardiologist: Darlin Coco MD  No chief complaint on file.     History of Present Illness: Kristen Whitehead is a 81 y.o. female who presents for four-month follow-up visit.  Kristen Whitehead is a 81 y.o. female with a hx of chronic AFib, mitral regurgitation, aortic insufficiency, HTN, diastolic HF, HL.  Admitted 3/24-3/25 with weakness and jaw and chest pain (occurred 4 days prior to presentation). Cardiac enzymes remained negative. Echo demonstrated normal LVF, mild BAE, mild AI and mild MR. Orthostatic vital signs were positive. Amlodipine was discontinued. Compression stockings were recommended. Close follow-up was recommended. Since then she has been feeling well. She is not having any dizziness or syncope. She is tolerating her current dose of losartan.  She had recent renal function studies.  On 04/14/15 which were satisfactory. She is on long-term warfarin for her atrial fibrillation.  This is followed in our clinic.  Most recent INR was slightly elevated at 3.3.  She has not been having any evidence of bleeding.  Past Medical History  Diagnosis Date  . History of mitral valve prolapse 06/15/2008    a. echo 1/14: mild LVH, EF 60-65%, mild MR, mild to mod BAE, PASP 35  . Chronic atrial fibrillation (HCC)     managed with rate control and coumadin  . History of congenital mitral regurgitation     mild  . Hypothyroidism   . Hypercholesterolemia   . GERD (gastroesophageal reflux disease)   . Chest pain     no known ischemic heart disease; negative Myoview July 2013. EF 75% with no ischemia.   . Hypertension   . Bladder prolapse, female, acquired   . Chronic diastolic heart failure ([place])   . Chronic anticoagulation   . Heart disease   . Cancer [facility])     thyroid    Past Surgical History  Procedure Laterality Date  .  Thyroidectomy    . Cholecystectomy    . Abdominal hysterectomy    . Cataract extraction       Current Outpatient Prescriptions  Medication Sig Dispense Refill  . amoxicillin (AMOXIL) 500 MG capsule Take 2,000 mg by mouth as needed (1 hour prior to dental appointment).     Marland Kitchen atenolol (TENORMIN) 25 MG tablet Take 25 mg by mouth daily.     . beta carotene w/minerals (OCUVITE) tablet Take 1 tablet by mouth daily.    . calcium carbonate (OS-CAL - DOSED IN MG OF ELEMENTAL CALCIUM) 1250 MG tablet Take 1 tablet by mouth 2 (two) times daily.     . Cholecalciferol (VITAMIN D) 2000 UNITS tablet Take 2,000 Units by mouth daily.     . clobetasol ointment (TEMOVATE) 7.82 % Apply 1 application topically daily as needed (when skin cancer is removed).   0  . estradiol (ESTRACE VAGINAL) 0.1 MG/GM vaginal cream Place 2 g vaginally once a week.     . furosemide (LASIX) 20 MG tablet Take 40 mg by mouth 2 (two) times daily. 2 tablets by mouth twice a day    . isosorbide mononitrate (IMDUR) 30 MG 24 hr tablet TAKE 1 TABLET EVERY DAY (Patient taking differently: TAKE 30 MG BY MOUTH DAILY) 90 tablet 2  . isosorbide mononitrate (IMDUR) 30 MG 24 hr tablet Take 30 mg by mouth daily.    Marland Kitchen latanoprost (XALATAN) 0.005 % ophthalmic solution  Place 1 drop into the left eye at bedtime.     Marland Kitchen levothyroxine (SYNTHROID, LEVOTHROID) 100 MCG tablet Take 100 mcg by mouth daily before breakfast.     . Magnesium 100 MG CAPS Take 100 mg by mouth at bedtime.    . Magnesium 500 MG TABS Take 500 mg by mouth at bedtime.    . mupirocin ointment (BACTROBAN) 2 % Apply 1 application topically daily.     Marland Kitchen NITROSTAT 0.4 MG SL tablet DISSOLVE 1 TABLET UNDER THE TONGUE EVERY 5 MINUTES FOR 3 DOSES AS NEEDED  FOR  CHEST  PAIN 25 tablet 5  . potassium chloride (K-DUR,KLOR-CON) 10 MEQ tablet TAKE 2 TABLETS (20 MEQ TOTAL) BY MOUTH DAILY. 180 tablet 0  . pramipexole (MIRAPEX) 0.125 MG tablet Take 1 tablet (0.125 mg total) by mouth every evening. 90  tablet 3  . RESTASIS 0.05 % ophthalmic emulsion Place 1 drop into both eyes 2 (two) times daily.     . temazepam (RESTORIL) 15 MG capsule Take 15 mg by mouth at bedtime.    . vitamin C (ASCORBIC ACID) 500 MG tablet Take 500 mg by mouth daily.    Marland Kitchen warfarin (COUMADIN) 5 MG tablet Take by mouth daily. Take by mouth daily as directed by the coumadin clinic    . HYDROcodone-acetaminophen (NORCO/VICODIN) 5-325 MG tablet Take 1 tablet by mouth daily as needed (pain).      No current facility-administered medications for this visit.    Allergies:   Azithromycin; Compazine; Demerol; Dolophine; Ebastine; Erythromycin; Ibuprofen; Statins; Tetanus toxoids; and Tetracyclines & related    Social History:  The patient  reports that she has never smoked. She has never used smokeless tobacco. She reports that she does not drink alcohol or use illicit drugs.   Family History:  The patient's family history includes Stroke in her father and mother. There is no history of Heart attack or Heart disease.    ROS:  Please see the history of present illness.   Otherwise, review of systems are positive for none.   All other systems are reviewed and negative.    PHYSICAL EXAM: VS:  BP 130/74 mmHg  Pulse 75  Ht 5' (1.524 m)  Wt 140 lb 1.9 oz (63.558 kg)  BMI 27.37 kg/m2 , BMI Body mass index is 27.37 kg/(m^2). GEN: Well nourished, well developed, in no acute distress HEENT: normal Neck: no JVD, carotid bruits, or masses Cardiac: Irregularly irregular.;  Grade 1/6 apical murmur , no rubs, or gallops,no edema  Respiratory:  clear to auscultation bilaterally, normal work of breathing GI: soft, nontender, nondistended, + BS MS: no deformity or atrophy Skin: warm and dry, no rash Neuro:  Strength and sensation are intact Psych: euthymic mood, full affect   EKG:  EKG is not ordered today.    Recent Labs: 08/01/2014: Pro B Natriuretic peptide (BNP) 2372.0* 11/11/2014: ALT 32; Magnesium 1.9; Platelets 204;  TSH 4.393 04/14/2015: BUN 10; Creatinine, Ser 0.80; Hemoglobin 16.0*; Potassium 4.7; Sodium 136    Lipid Panel    Component Value Date/Time   CHOL 211* 11/04/2012 1524   TRIG 201.0* 11/04/2012 1524   HDL 66.80 11/04/2012 1524   CHOLHDL 3 11/04/2012 1524   VLDL 40.2* 11/04/2012 1524   LDLCALC 109* 04/16/2012 1043   LDLDIRECT 98.8 11/04/2012 1524      Wt Readings from Last 3 Encounters:  05/24/15 140 lb 1.9 oz (63.558 kg)  04/14/15 140 lb (63.504 kg)  01/20/15 138 lb (62.596 kg)  ASSESSMENT AND PLAN:  Postural hypotension Improved. She does not always wear support stockings.  Chest pain, unspecified chest pain type This is resolved. With her increased indigestion, I suspect her chest pain is related to GI etiology. I have asked her to take Pepcid as needed.  Chronic atrial fibrillation Rate remains controlled. Continue Coumadin.   Benign hypertensive heart disease without heart failure Blood pressure remains controlled.  Chronic diastolic heart failure Volume is stable.  Her weight gain appears to be due to increased flesh rather than increased fluid retention.  She will try cutting back further on calories.  Mitral regurgitation  Recent echocardiogram with mild mitral regurgitation and normal ejection fraction.   Current medicines are reviewed at length with the patient today.  The patient does not have concerns regarding medicines.  The following changes have been made:  no change  Labs/ tests ordered today include:  No orders of the defined types were placed in this encounter.     Disposition: Continue current medication.  Recheck in 4 months for office visit and EKG.  The patient does not like coming to the [employer] because of the parking difficulty for her.  She still drives herself around 10.  She would like to go to the [place] office going forward after my retirement.  We will try to get her set up with Dr.  Oval Linsey.  Berna Spare MD 05/24/2015 1:34 PM    [facility] [address], [place], [place]  [address] Phone: [phone]; Fax: [phone]

## 2015-05-24 NOTE — Patient Instructions (Signed)
Medication Instructions:  Your physician recommends that you continue on your current medications as directed. Please refer to the Current Medication list given to you today.  Labwork: NONE  Testing/Procedures: NONE  Follow-Up: Your physician wants you to follow-up in: [place] will receive a reminder letter in the mail two months in advance. If you don't receive a letter, please call our office to schedule the follow-up appointment.

## 2015-05-25 DIAGNOSIS — Z7901 Long term (current) use of anticoagulants: Secondary | ICD-10-CM | POA: Diagnosis not present

## 2015-05-25 DIAGNOSIS — Z85850 Personal history of malignant neoplasm of thyroid: Secondary | ICD-10-CM | POA: Diagnosis not present

## 2015-05-25 DIAGNOSIS — Q233 Congenital mitral insufficiency: Secondary | ICD-10-CM | POA: Diagnosis not present

## 2015-05-25 DIAGNOSIS — I482 Chronic atrial fibrillation: Secondary | ICD-10-CM | POA: Diagnosis not present

## 2015-05-25 DIAGNOSIS — I5032 Chronic diastolic (congestive) heart failure: Secondary | ICD-10-CM | POA: Diagnosis not present

## 2015-05-25 DIAGNOSIS — I341 Nonrheumatic mitral (valve) prolapse: Secondary | ICD-10-CM | POA: Diagnosis not present

## 2015-05-25 DIAGNOSIS — I10 Essential (primary) hypertension: Secondary | ICD-10-CM | POA: Diagnosis not present

## 2015-05-25 DIAGNOSIS — N8110 Cystocele, unspecified: Secondary | ICD-10-CM | POA: Diagnosis not present

## 2015-05-25 DIAGNOSIS — Z5181 Encounter for therapeutic drug level monitoring: Secondary | ICD-10-CM | POA: Diagnosis not present

## 2015-05-26 ENCOUNTER — Ambulatory Visit (INDEPENDENT_AMBULATORY_CARE_PROVIDER_SITE_OTHER): Payer: Commercial Managed Care - HMO | Admitting: Cardiology

## 2015-05-26 DIAGNOSIS — I482 Chronic atrial fibrillation, unspecified: Secondary | ICD-10-CM

## 2015-05-26 DIAGNOSIS — Z5181 Encounter for therapeutic drug level monitoring: Secondary | ICD-10-CM

## 2015-05-26 LAB — POCT INR: INR: 2.4

## 2015-05-31 DIAGNOSIS — E890 Postprocedural hypothyroidism: Secondary | ICD-10-CM | POA: Diagnosis not present

## 2015-06-01 DIAGNOSIS — I482 Chronic atrial fibrillation: Secondary | ICD-10-CM | POA: Diagnosis not present

## 2015-06-01 DIAGNOSIS — Z5181 Encounter for therapeutic drug level monitoring: Secondary | ICD-10-CM | POA: Diagnosis not present

## 2015-06-01 DIAGNOSIS — Z7901 Long term (current) use of anticoagulants: Secondary | ICD-10-CM | POA: Diagnosis not present

## 2015-06-01 DIAGNOSIS — I341 Nonrheumatic mitral (valve) prolapse: Secondary | ICD-10-CM | POA: Diagnosis not present

## 2015-06-01 DIAGNOSIS — Q233 Congenital mitral insufficiency: Secondary | ICD-10-CM | POA: Diagnosis not present

## 2015-06-01 DIAGNOSIS — N8110 Cystocele, unspecified: Secondary | ICD-10-CM | POA: Diagnosis not present

## 2015-06-01 DIAGNOSIS — I5032 Chronic diastolic (congestive) heart failure: Secondary | ICD-10-CM | POA: Diagnosis not present

## 2015-06-01 DIAGNOSIS — Z85850 Personal history of malignant neoplasm of thyroid: Secondary | ICD-10-CM | POA: Diagnosis not present

## 2015-06-01 DIAGNOSIS — I10 Essential (primary) hypertension: Secondary | ICD-10-CM | POA: Diagnosis not present

## 2015-06-09 DIAGNOSIS — H401131 Primary open-angle glaucoma, bilateral, mild stage: Secondary | ICD-10-CM | POA: Diagnosis not present

## 2015-06-15 ENCOUNTER — Ambulatory Visit (INDEPENDENT_AMBULATORY_CARE_PROVIDER_SITE_OTHER): Payer: Commercial Managed Care - HMO | Admitting: Cardiology

## 2015-06-15 DIAGNOSIS — Z5181 Encounter for therapeutic drug level monitoring: Secondary | ICD-10-CM

## 2015-06-15 DIAGNOSIS — I341 Nonrheumatic mitral (valve) prolapse: Secondary | ICD-10-CM | POA: Diagnosis not present

## 2015-06-15 DIAGNOSIS — N8110 Cystocele, unspecified: Secondary | ICD-10-CM | POA: Diagnosis not present

## 2015-06-15 DIAGNOSIS — I10 Essential (primary) hypertension: Secondary | ICD-10-CM | POA: Diagnosis not present

## 2015-06-15 DIAGNOSIS — Z7901 Long term (current) use of anticoagulants: Secondary | ICD-10-CM | POA: Diagnosis not present

## 2015-06-15 DIAGNOSIS — I482 Chronic atrial fibrillation, unspecified: Secondary | ICD-10-CM

## 2015-06-15 DIAGNOSIS — I5032 Chronic diastolic (congestive) heart failure: Secondary | ICD-10-CM | POA: Diagnosis not present

## 2015-06-15 DIAGNOSIS — Z85850 Personal history of malignant neoplasm of thyroid: Secondary | ICD-10-CM | POA: Diagnosis not present

## 2015-06-15 DIAGNOSIS — Q233 Congenital mitral insufficiency: Secondary | ICD-10-CM | POA: Diagnosis not present

## 2015-06-15 LAB — POCT INR: INR: 2.1

## 2015-06-16 ENCOUNTER — Encounter: Payer: Self-pay | Admitting: Cardiology

## 2015-06-19 ENCOUNTER — Other Ambulatory Visit: Payer: Self-pay | Admitting: Cardiology

## 2015-06-20 DIAGNOSIS — N8182 Incompetence or weakening of pubocervical tissue: Secondary | ICD-10-CM | POA: Diagnosis not present

## 2015-06-21 ENCOUNTER — Telehealth: Payer: Self-pay | Admitting: Cardiology

## 2015-06-21 NOTE — Telephone Encounter (Signed)
Patient had problems last night with chills for several hours and did vomit once Today feeling better but was concerned about her lower blood pressure, she has not checked since this am.  Advised patient to continue to current medications and drink plenty of fluids today If continues to have chills and vomiting to call PCP Discussed with  [name] and if systolic blood pressure drops below 100 or remains in the low 100's to call back Verbalized understanding

## 2015-06-21 NOTE — Telephone Encounter (Signed)
New message      Pt states that she had chills all night long, threw up once and had a temp.  Bp this am 101/61 HR 63.  Please advise on what to do.

## 2015-06-30 ENCOUNTER — Ambulatory Visit (INDEPENDENT_AMBULATORY_CARE_PROVIDER_SITE_OTHER): Payer: Commercial Managed Care - HMO | Admitting: Cardiology

## 2015-06-30 ENCOUNTER — Encounter: Payer: Self-pay | Admitting: Cardiology

## 2015-06-30 ENCOUNTER — Ambulatory Visit (INDEPENDENT_AMBULATORY_CARE_PROVIDER_SITE_OTHER): Payer: Commercial Managed Care - HMO | Admitting: *Deleted

## 2015-06-30 VITALS — BP 110/62 | HR 84 | Ht 60.0 in | Wt 140.6 lb

## 2015-06-30 DIAGNOSIS — Z7901 Long term (current) use of anticoagulants: Secondary | ICD-10-CM

## 2015-06-30 DIAGNOSIS — I482 Chronic atrial fibrillation, unspecified: Secondary | ICD-10-CM

## 2015-06-30 DIAGNOSIS — I4891 Unspecified atrial fibrillation: Secondary | ICD-10-CM

## 2015-06-30 DIAGNOSIS — I5032 Chronic diastolic (congestive) heart failure: Secondary | ICD-10-CM

## 2015-06-30 DIAGNOSIS — I340 Nonrheumatic mitral (valve) insufficiency: Secondary | ICD-10-CM | POA: Diagnosis not present

## 2015-06-30 DIAGNOSIS — I119 Hypertensive heart disease without heart failure: Secondary | ICD-10-CM | POA: Diagnosis not present

## 2015-06-30 DIAGNOSIS — Z5181 Encounter for therapeutic drug level monitoring: Secondary | ICD-10-CM

## 2015-06-30 LAB — POCT INR: INR: 1.6

## 2015-06-30 NOTE — Progress Notes (Signed)
Cardiology Office Note   Date:  06/30/2015   ID:  Kristen Whitehead, DOB 02/21/1923, MRN [id number]  PCP:  Kristen Pac, MD  Cardiologist: Darlin Coco MD  Chief Complaint  Patient presents with  . Atrial Fibrillation      History of Present Illness: Kristen Whitehead is a 81 y.o. female who presents for .  Kristen Whitehead is a 81 y.o. female with a hx of chronic AFib, mitral regurgitation, aortic insufficiency, HTN, diastolic HF, HL.  Admitted 3/24-3/25 with weakness and jaw and chest pain (occurred 4 days prior to presentation). Cardiac enzymes remained negative. Echo demonstrated normal LVF, mild BAE, mild AI and mild MR. Orthostatic vital signs were positive. Amlodipine was discontinued. Compression stockings were recommended. Close follow-up was recommended. Since then she has been feeling well. She is not having any dizziness or syncope. She is tolerating her current dose of losartan. She had recent renal function studies. On 04/14/15 which were satisfactory. She is on long-term warfarin for her atrial fibrillation. This is followed in our clinic.  She has not been having any evidence of bleeding. In the past she was having wide swings in her weight from day to day.  She now appears to be euvolemic on current dose of furosemide 40 mg twice a day. She denies any chest discomfort or shortness of breath. Her dyspepsia is better and she did not fill the Pepcid that we talked about last time  Past Medical History  Diagnosis Date  . History of mitral valve prolapse 06/15/2008    a. echo 1/14: mild LVH, EF 60-65%, mild MR, mild to mod BAE, PASP 35  . Chronic atrial fibrillation (HCC)     managed with rate control and coumadin  . History of congenital mitral regurgitation     mild  . Hypothyroidism   . Hypercholesterolemia   . GERD (gastroesophageal reflux disease)   . Chest pain     no known ischemic heart disease; negative Myoview July 2013. EF 75%  with no ischemia.   . Hypertension   . Bladder prolapse, female, acquired   . Chronic diastolic heart failure ([place])   . Chronic anticoagulation   . Heart disease   . Cancer [facility])     thyroid    Past Surgical History  Procedure Laterality Date  . Thyroidectomy    . Cholecystectomy    . Abdominal hysterectomy    . Cataract extraction       Current Outpatient Prescriptions  Medication Sig Dispense Refill  . amoxicillin (AMOXIL) 500 MG capsule Take 2,000 mg by mouth as needed (1 hour prior to dental appointment).     Marland Kitchen atenolol (TENORMIN) 25 MG tablet Take 25 mg by mouth daily.     . beta carotene w/minerals (OCUVITE) tablet Take 1 tablet by mouth daily.    . calcium carbonate (OS-CAL - DOSED IN MG OF ELEMENTAL CALCIUM) 1250 MG tablet Take 1 tablet by mouth 2 (two) times daily.     . Cholecalciferol (VITAMIN D) 2000 UNITS tablet Take 2,000 Units by mouth daily.     . clobetasol ointment (TEMOVATE) [id number] % Apply 1 application topically daily as needed (when skin cancer is removed).   0  . estradiol (ESTRACE VAGINAL) 0.1 MG/GM vaginal cream Place 2 g vaginally once a week.     . furosemide (LASIX) 20 MG tablet Take 40 mg by mouth 2 (two) times daily. 2 tablets by mouth twice a day    . HYDROcodone-acetaminophen (  NORCO/VICODIN) 5-325 MG tablet Take 1 tablet by mouth daily as needed (pain).     . isosorbide mononitrate (IMDUR) 30 MG 24 hr tablet Take 30 mg by mouth daily.    Marland Kitchen latanoprost (XALATAN) 0.005 % ophthalmic solution Place 1 drop into the left eye at bedtime.     Marland Kitchen levothyroxine (SYNTHROID, LEVOTHROID) 100 MCG tablet Take 100 mcg by mouth every other day. Alternating days with Levothyroxine sodium 22mcg.    . levothyroxine (SYNTHROID, LEVOTHROID) 88 MCG tablet Take 88 mcg by mouth every other day.  0  . Magnesium 100 MG CAPS Take 100 mg by mouth at bedtime.    . Magnesium 500 MG TABS Take 500 mg by mouth at bedtime.    . mupirocin ointment (BACTROBAN) 2 % Apply 1 application  topically daily.     Marland Kitchen NITROSTAT 0.4 MG SL tablet DISSOLVE 1 TABLET UNDER THE TONGUE EVERY 5 MINUTES FOR 3 DOSES AS NEEDED  FOR  CHEST  PAIN 25 tablet 5  . potassium chloride (K-DUR,KLOR-CON) 10 MEQ tablet TAKE 2 TABLETS (20 MEQ TOTAL) BY MOUTH DAILY. 180 tablet 0  . pramipexole (MIRAPEX) 0.125 MG tablet Take 1 tablet (0.125 mg total) by mouth every evening. 90 tablet 3  . RESTASIS 0.05 % ophthalmic emulsion Place 1 drop into both eyes 2 (two) times daily.     . temazepam (RESTORIL) 15 MG capsule Take 15 mg by mouth at bedtime.    . vitamin C (ASCORBIC ACID) 500 MG tablet Take 500 mg by mouth daily.    Marland Kitchen warfarin (COUMADIN) 5 MG tablet TAKE AS DIRECTED BY ANTICOAGULATION CLINIC 90 tablet 1   No current facility-administered medications for this visit.    Allergies:   Azithromycin; Compazine; Demerol; Dolophine; Ebastine; Erythromycin; Ibuprofen; Statins; Tetanus toxoids; and Tetracyclines & related    Social History:  The patient  reports that she has never smoked. She has never used smokeless tobacco. She reports that she does not drink alcohol or use illicit drugs.   Family History:  The patient's family history includes Stroke in her father and mother. There is no history of Heart attack or Heart disease.    ROS:  Please see the history of present illness.   Otherwise, review of systems are positive for none.   All other systems are reviewed and negative.    PHYSICAL EXAM: VS:  BP 110/62 mmHg  Pulse 84  Ht 5' (1.524 m)  Wt 140 lb 9.6 oz (63.776 kg)  BMI 27.46 kg/m2 , BMI Body mass index is 27.46 kg/(m^2). GEN: Well nourished, well developed, in no acute distress HEENT: normal Neck: no JVD, carotid bruits, or masses Cardiac: Irregularly irregular; grade 1/6 apical systolic murmur.  No, rubs, or gallops,no edema  Respiratory:  clear to auscultation bilaterally, normal work of breathing GI: soft, nontender, nondistended, + BS MS: no deformity or atrophy Skin: warm and dry, no  rash Neuro:  Strength and sensation are intact Psych: euthymic mood, full affect   EKG:  EKG is ordered today. The ekg ordered today demonstrates atrial fibrillation with controlled ventricular response at 84 bpm.  No ischemic changes   Recent Labs: 08/01/2014: Pro B Natriuretic peptide (BNP) 2372.0* 11/11/2014: ALT 32; Magnesium 1.9; Platelets 204; TSH 4.393 04/14/2015: BUN 10; Creatinine, Ser 0.80; Hemoglobin 16.0*; Potassium 4.7; Sodium 136    Lipid Panel    Component Value Date/Time   CHOL 211* 11/04/2012 1524   TRIG 201.0* 11/04/2012 1524   HDL 66.80 11/04/2012 1524  CHOLHDL 3 11/04/2012 1524   VLDL 40.2* 11/04/2012 1524   LDLCALC 109* 04/16/2012 1043   LDLDIRECT 98.8 11/04/2012 1524      Wt Readings from Last 3 Encounters:  06/30/15 140 lb 9.6 oz (63.776 kg)  05/24/15 140 lb 1.9 oz (63.558 kg)  04/14/15 140 lb (63.504 kg)         ASSESSMENT AND PLAN:  Postural hypotension Improved. She does not always wear support stockings.  Chest pain, unspecified chest pain type This is resolved. With her increased indigestion, I suspect her chest pain is related to GI etiology. I have asked her to take Pepcid as needed.  Chronic atrial fibrillation Rate remains controlled. Continue Coumadin.   Benign hypertensive heart disease without heart failure Blood pressure remains controlled.  Chronic diastolic heart failure Volume is stable. Her weight gain appears to be due to increased flesh rather than increased fluid retention. She will try cutting back further on calories.  Mitral regurgitation  Recent echocardiogram with mild mitral regurgitation and normal ejection fraction.   Current medicines are reviewed at length with the patient today. The patient does not have concerns regarding medicines.  The following changes have been made: no change  Labs/ tests ordered today include:  No orders of the defined types were placed in this  encounter.    Disposition: Continue current medication. Recheck in 4 months for office visit and EKG. The patient does not like coming to the [employer] because of the parking difficulty for her. She still drives herself around 10. She would like to go to the [place] office going forward after my retirement. We will try to get her set up with Dr. Oval Linsey.   Current medicines are reviewed at length with the patient today.  The patient does not have concerns regarding medicines.  The following changes have been made:  no change  Labs/ tests ordered today include:   Orders Placed This Encounter  Procedures  . EKG 12-Lead     Disposition:   Continue current medication.  She would like to be followed up at [facility].  We will have her see Dr. Oval Linsey in 4 months  Signed, Darlin Coco MD 06/30/2015 5:47 PM    [address] [place], [place],   [address] Phone: [phone]; Fax: [phone]

## 2015-06-30 NOTE — Patient Instructions (Signed)
Medication Instructions:  Your physician recommends that you continue on your current medications as directed. Please refer to the Current Medication list given to you today.  Labwork: none  Testing/Procedures: none  Follow-Up: Your physician recommends that you schedule a follow-up appointment in: 4 month ov with Dr Oval Linsey   If you need a refill on your cardiac medications before your next appointment, please call your pharmacy.

## 2015-07-12 ENCOUNTER — Ambulatory Visit (INDEPENDENT_AMBULATORY_CARE_PROVIDER_SITE_OTHER): Payer: Commercial Managed Care - HMO | Admitting: Pharmacist Clinician (PhC)/ Clinical Pharmacy Specialist

## 2015-07-12 DIAGNOSIS — Z7901 Long term (current) use of anticoagulants: Secondary | ICD-10-CM

## 2015-07-12 DIAGNOSIS — I4891 Unspecified atrial fibrillation: Secondary | ICD-10-CM | POA: Diagnosis not present

## 2015-07-12 DIAGNOSIS — I482 Chronic atrial fibrillation, unspecified: Secondary | ICD-10-CM

## 2015-07-12 DIAGNOSIS — Z5181 Encounter for therapeutic drug level monitoring: Secondary | ICD-10-CM | POA: Diagnosis not present

## 2015-07-12 LAB — POCT INR: INR: 2.4

## 2015-07-14 DIAGNOSIS — L03031 Cellulitis of right toe: Secondary | ICD-10-CM | POA: Diagnosis not present

## 2015-07-18 ENCOUNTER — Ambulatory Visit: Payer: Commercial Managed Care - HMO | Admitting: Podiatry

## 2015-07-18 DIAGNOSIS — Z1231 Encounter for screening mammogram for malignant neoplasm of breast: Secondary | ICD-10-CM | POA: Diagnosis not present

## 2015-07-18 DIAGNOSIS — Z01419 Encounter for gynecological examination (general) (routine) without abnormal findings: Secondary | ICD-10-CM | POA: Diagnosis not present

## 2015-07-18 DIAGNOSIS — N819 Female genital prolapse, unspecified: Secondary | ICD-10-CM | POA: Diagnosis not present

## 2015-07-18 DIAGNOSIS — Z6828 Body mass index (BMI) 28.0-28.9, adult: Secondary | ICD-10-CM | POA: Diagnosis not present

## 2015-07-18 DIAGNOSIS — R3915 Urgency of urination: Secondary | ICD-10-CM | POA: Diagnosis not present

## 2015-07-21 DIAGNOSIS — E890 Postprocedural hypothyroidism: Secondary | ICD-10-CM | POA: Diagnosis not present

## 2015-07-24 ENCOUNTER — Ambulatory Visit (INDEPENDENT_AMBULATORY_CARE_PROVIDER_SITE_OTHER): Payer: Commercial Managed Care - HMO | Admitting: Neurology

## 2015-07-24 ENCOUNTER — Encounter: Payer: Self-pay | Admitting: Neurology

## 2015-07-24 VITALS — BP 126/68 | HR 68 | Resp 14 | Ht 60.0 in | Wt 141.0 lb

## 2015-07-24 DIAGNOSIS — G609 Hereditary and idiopathic neuropathy, unspecified: Secondary | ICD-10-CM

## 2015-07-24 DIAGNOSIS — G2581 Restless legs syndrome: Secondary | ICD-10-CM | POA: Diagnosis not present

## 2015-07-24 DIAGNOSIS — R600 Localized edema: Secondary | ICD-10-CM

## 2015-07-24 MED ORDER — PRAMIPEXOLE DIHYDROCHLORIDE 0.125 MG PO TABS: 0.1250 mg | ORAL_TABLET | Freq: Every evening | ORAL | Status: DC

## 2015-07-24 NOTE — Progress Notes (Signed)
Subjective:    Patient ID: Kristen Whitehead is Kristen 81 y.o. Whitehead.  HPI     Interim history:    Kristen Whitehead is Kristen very pleasant 81 year old right-handed woman with an underlying medical history of hypertension, hyperlipidemia, atrial fibrillation, celiac disease, scoliosis, reflux disease, who presents for followup consultation of her bilateral foot pain, likely RLS. She is unaccompanied today. I last saw her on 01/20/2015 at which time she reported feeling stable for the most part. Her burning sensation seemed about the same and her leg cramping seemed Kristen little less overall. She was tolerating low-dose Mirapex. She saw her cardiologist 2 days prior. She had presented to the emergency room on 11/10/2014 with chest pain and dizziness. She was admitted for workup. Cardiac workup was negative. She has Kristen. Fib, rate controlled. She has stable lower extremity swelling and was reminded by her cardiologist to use compression stockings. She was not drinking enough water. I asked her to continue low-dose Mirapex each night.  Today, 07/24/2015: She reports feeling stable. Feels that there is tingling in the legs and feet have Kristen burning sensation. She feels, her Sx improved when she started Mirapex. She has some tingling in the fingers intermittently. She feels she has more forgetful.   Previously:   I saw her on 07/21/2014, at which time she reported Kristen fall about 3 months prior. She had tripped over something. She had no significant injuries and no sequelae. She was not drinking enough water. Her leg swelling was stable. I suggested she continue with low-dose Mirapex, 0.125 mg once nightly.  I saw her on 12/27/2013, at which time she noted ankle swelling. She had gone into cardiac failure the year before and felt similar to that. Nevertheless, because of her lower extremity swelling I suggested she cut back on Mirapex to 0.125 mg each night and see if the swelling improves. If she had flareup in her  restless leg symptoms I suggested she could go back to 0.25 mg nightly.   I saw her on 06/28/2013, at which time I noted on clinical exam that she had mild evidence of neuropathy in the absence of Kristen history of diabetes with normal hemoglobin A1c noted and normal B12 levels. She had EMG and nerve conduction studies that did not show any significant neuropathy. I felt that she had most likely RLS. She had done well with Mirapex. I did not make any changes to her medication regimen and asked her to continue taking Mirapex 0.25 mg each night. She did not have any side effects at the time.  I first met her on 02/17/2013, at which time I suggested EMG and nerve conduction testing and Kristen treatment trial with Mirapex because of possible underlying RLS. She has previously seen Kristen podiatrist who had done Kristen nerve biopsy which she reported was normal. She has Kristen history of Kristen. fib. She has Kristen history of congestive heart failure and is followed by cardiology. Echocardiogram in January 2014 showed biatrial enlargement and mild mitral regurgitation and an EF of 08-14% with diastolic dysfunction.   For years she has had burning in her feet and started having Kristen crawling sensation in her distal legs in 2014. She has also had cramping in her muscles for years and she was taken off of her statin in 2013 and her cramping eventually became better. She has had pins and needles sensation and it helps to walk around. She has worse symptoms at night or by the end of the day. She  remembers having "terrible growing pains" as Kristen child and does not have Kristen FHx of RLS as far as she knows. She had nerve biopsies both legs by her podiatrist, which did not show inflammation. While she has Kristen history of chronic back pain she denied any radiating back pain to her legs. In fact she described pins and needle and crawling sensation in the distal lower extremities, radiating upwards. She has been taking OTC Mg supplements, which helped her cramps.    Her  EMG and nerve conduction testing from 03/03/2013 showed no evidence of peripheral neuropathy. We called her with her test results. Her foot pain and paresthesias improved.   Her Past Medical History Is Significant For: Past Medical History  Diagnosis Date  . History of mitral valve prolapse 06/15/2008    Kristen. echo 1/14: mild LVH, EF 60-65%, mild MR, mild to mod BAE, PASP 35  . Chronic atrial fibrillation (HCC)     managed with rate control and coumadin  . History of congenital mitral regurgitation     mild  . Hypothyroidism   . Hypercholesterolemia   . GERD (gastroesophageal reflux disease)   . Chest pain     no known ischemic heart disease; negative Myoview July 2013. EF 75% with no ischemia.   . Hypertension   . Bladder prolapse, [place]head, acquired   . Chronic diastolic heart failure ([place])   . Chronic anticoagulation   . Heart disease   . Cancer [facility])     thyroid    Her Past Surgical History Is Significant For: Past Surgical History  Procedure Laterality Date  . Thyroidectomy    . Cholecystectomy    . Abdominal hysterectomy    . Cataract extraction      Her Family History Is Significant For: Family History  Problem Relation Age of Onset  . Heart attack Neg Hx   . Heart disease Neg Hx   . Stroke Mother   . Stroke Father     Her Social History Is Significant For: Social History   Social History  . Marital Status: Widowed    Spouse Name: N/Kristen  . Number of Children: 3  . Years of Education: BA   Occupational History  .     Social History Main Topics  . Smoking status: Never Smoker   . Smokeless tobacco: Never Used  . Alcohol Use: No  . Drug Use: No  . Sexual Activity: No   Other Topics Concern  . None   Social History Narrative   Pt lives at home alone.   Caffeine Use: quit 27yrs ago    Her Allergies Are:  Allergies  Allergen Reactions  . Azithromycin Nausea Only  . Compazine [Prochlorperazine Edisylate] Nausea Only  . Demerol Nausea Only  .  Dolophine [Methadone] Nausea Only  . Ebastine Nausea Only    EBS  . Erythromycin Nausea Only  . Ibuprofen Nausea Only  . Statins Other (See Comments)    myalgias  . Tetanus Toxoids Nausea Only  . Tetracyclines & Related Nausea Only  :   Her Current Medications Are:  Outpatient Encounter Prescriptions as of 07/24/2015  Medication Sig  . amoxicillin (AMOXIL) 500 MG capsule Take 2,000 mg by mouth as needed (1 hour prior to dental appointment).   Marland Kitchen atenolol (TENORMIN) 25 MG tablet Take 25 mg by mouth daily.   . beta carotene w/minerals (OCUVITE) tablet Take 1 tablet by mouth daily.  . calcium carbonate (OS-CAL - DOSED IN MG OF ELEMENTAL CALCIUM) 1250  MG tablet Take 1 tablet by mouth 2 (two) times daily.   . Cholecalciferol (VITAMIN D) 2000 UNITS tablet Take 2,000 Units by mouth daily.   . clobetasol ointment (TEMOVATE) 7.26 % Apply 1 application topically daily as needed (when skin cancer is removed).   Marland Kitchen estradiol (ESTRACE VAGINAL) 0.1 MG/GM vaginal cream Place 2 g vaginally once Kristen week.   . furosemide (LASIX) 20 MG tablet Take 40 mg by mouth 2 (two) times daily. 2 tablets by mouth twice Kristen day  . HYDROcodone-acetaminophen (NORCO/VICODIN) 5-325 MG tablet Take 1 tablet by mouth daily as needed (pain).   . isosorbide mononitrate (IMDUR) 30 MG 24 hr tablet Take 30 mg by mouth daily.  Marland Kitchen latanoprost (XALATAN) 0.005 % ophthalmic solution Place 1 drop into the left eye at bedtime.   Marland Kitchen levothyroxine (SYNTHROID, LEVOTHROID) 100 MCG tablet Take 100 mcg by mouth every other day. Alternating days with Levothyroxine sodium 95mcg.  . levothyroxine (SYNTHROID, LEVOTHROID) 88 MCG tablet Take 88 mcg by mouth every other day.  . Magnesium 100 MG CAPS Take 100 mg by mouth at bedtime.  . Magnesium 500 MG TABS Take 500 mg by mouth at bedtime.  . mupirocin ointment (BACTROBAN) 2 % Apply 1 application topically daily.   Marland Kitchen NITROSTAT 0.4 MG SL tablet DISSOLVE 1 TABLET UNDER THE TONGUE EVERY 5 MINUTES FOR 3 DOSES AS  NEEDED  FOR  CHEST  PAIN  . potassium chloride (K-DUR,KLOR-CON) 10 MEQ tablet TAKE 2 TABLETS (20 MEQ TOTAL) BY MOUTH DAILY.  Marland Kitchen pramipexole (MIRAPEX) 0.125 MG tablet Take 1 tablet (0.125 mg total) by mouth every evening.  . RESTASIS 0.05 % ophthalmic emulsion Place 1 drop into both eyes 2 (two) times daily.   . temazepam (RESTORIL) 15 MG capsule Take 15 mg by mouth at bedtime.  . vitamin C (ASCORBIC ACID) 500 MG tablet Take 500 mg by mouth daily.  Marland Kitchen warfarin (COUMADIN) 5 MG tablet TAKE AS DIRECTED BY ANTICOAGULATION CLINIC  . [DISCONTINUED] pramipexole (MIRAPEX) 0.125 MG tablet Take 1 tablet (0.125 mg total) by mouth every evening.   No facility-administered encounter medications on file as of 07/24/2015.  :  Review of Systems:  Out of Kristen complete 14 point review of systems, all are reviewed and negative with the exception of these symptoms as listed below:   Review of Systems  Neurological:       Patient reports that most of her symptoms have resolve.  Recently notice involuntary movements in toes on R foot. Having small spasms in her R leg. Involuntary movements in fingers on R hand.     Objective:  Neurologic Exam  Physical Exam Physical Examination:   Filed Vitals:   07/24/15 1114  BP: 126/68  Pulse: 68  Resp: 14   General Examination: The patient is Kristen very pleasant 81 y.o. Whitehead in no acute distress. She appears well-developed and well-nourished and well groomed.   HEENT: Normocephalic, atraumatic, pupils are equal, round and reactive to light and accommodation. She is s/p bilateral cataract repairs. She has what appears to be Kristen scar in the medial aspect of her R cornea. Extraocular tracking is good without limitation to gaze excursion or nystagmus noted. Normal smooth pursuit is noted. Hearing is grossly intact. Face is symmetric with normal facial animation and normal facial sensation. Speech is clear with no dysarthria noted. There is no hypophonia. There is no lip,  neck/head, jaw or voice tremor. Neck is supple with full range of passive and active motion. There are  no carotid bruits on auscultation. Oropharynx exam reveals: moderate mouth dryness, adequate dental hygiene and no airway crowding. Tongue protrudes centrally and palate elevates symmetrically.    Chest: Clear to auscultation without wheezing, rhonchi or crackles noted.  Heart: S1+S2+0, irregularly irregular, slight systolic murmur noted.    Abdomen: Soft, non-tender and non-distended with normal bowel sounds appreciated on auscultation.  Extremities: There is 1+ pitting edema in the distal lower extremities bilaterally, better, she is wearing compression stockings up to knees. Pedal pulses are intact.  Skin: Warm and dry without trophic changes noted. There are no varicose veins.  Musculoskeletal: exam reveals no obvious joint deformities, tenderness or joint swelling or erythema.   Neurologically:  Mental status: The patient is awake, alert and oriented in all 4 spheres. Her memory, attention, language and knowledge are appropriate. There is no aphasia, agnosia, apraxia or anomia. Speech is clear with normal prosody and enunciation. Thought process is linear. Mood is congruent and affect is normal.  Cranial nerves are as described above under HEENT exam. In addition, shoulder shrug is normal with equal shoulder height noted. Motor exam: Normal bulk, strength and tone is noted. There is no drift, tremor or rebound. Romberg is negative. Reflexes are 1+ in the UEs and trace in the knees and absent in both ankles. Fine motor skills are intact.  Cerebellar testing shows no dysmetria or intention tremor on finger to nose testing. There is no truncal or gait ataxia.  Sensory exam is intact to light touch, pinprick, vibration, temperature sense in the upper extremities, but mildly decreased to all modalities in the distal LEs, up to mid-shin, stable.  Gait, station and balance: she stands with no  significant difficulty. No veering to one side is noted. No leaning to one side is noted. Posture is age-appropriate and stance is slightly wide-based. No problems turning are noted. Her R foot tends to point outward some, unchanged. She walks cautiously. She does not use any assistive device.   Assessment and Plan:   In summary, Kristen Whitehead is Kristen very pleasant 81 year old Whitehead with Kristen history of leg and foot pain. Her history and physical exam are consistent with mild neuropathy and RLS. She has been on low dose generic Mirapex each night, 0.125 mg. She has had symptoms for about 10 to 15 years with very little progression reported. She is not diabetic and had Kristen normal B12 level. Her exam is stable. Her EMG/NCV from 2014 did not show any evidence of significant neuropathy. She has done better after starting Mirapex low dose, 0.125 mg, which we reduced in 5/15 because of lower extremity swelling. This seems stable. She is seeing [name], but she is retiring. I asked her to drink more water, use compression stockings to her lower extremities and continue Mirapex at the current dose. I renewed the Rx today. I suggested Kristen follow-up in 6 months, sooner if the need arises. I answered all her questions today and she was in agreement. I spent 20 minutes in total face-to-face time with the patient, more than 50% of which was spent in counseling and coordination of care, reviewing test results, reviewing medication and discussing or reviewing the diagnosis of PN, RLS, the prognosis and treatment options.

## 2015-07-24 NOTE — Patient Instructions (Signed)
We will continue with your current medication, Pramipexole, 0.125 mg once daily at night.

## 2015-07-26 ENCOUNTER — Ambulatory Visit (INDEPENDENT_AMBULATORY_CARE_PROVIDER_SITE_OTHER): Payer: Commercial Managed Care - HMO | Admitting: Podiatry

## 2015-07-26 ENCOUNTER — Encounter: Payer: Self-pay | Admitting: Podiatry

## 2015-07-26 DIAGNOSIS — Q828 Other specified congenital malformations of skin: Secondary | ICD-10-CM | POA: Diagnosis not present

## 2015-07-26 DIAGNOSIS — L603 Nail dystrophy: Secondary | ICD-10-CM

## 2015-07-27 NOTE — Progress Notes (Signed)
Patient ID: Kristen Whitehead, female   DOB: Jul 30, 1923, 81 y.o.   MRN: [id number]  Subjective: This patient presents today request running of toenails without any history of discomfort in the nails. She also is complaining of multiple plantar lesions and request debridement. The last visit for similar service was on 12/09/2014  Objective: Review of note from [place] dated 12/13/2014: Intact vascular status Neurologic sensation intact Prominent plantar flexed metatarsal with fat pad atrophy at metatarsal heads Mild flexible contracture lesser digits Right hallux is mild dorsal contracture The toenails are elongated and uncomfortable  Today's exam Palpable pedal pulses bilaterally Toenails are incurvated in elongated with normal texture 6-10 Nucleated plantar keratoses sub-first, fifth, second MPJ right  Assessment: Incurvated toenails 6-10 Porokeratosis 3  Plan: Debrided toenails 6-10 mechanically electronically without a bleeding Debride plantar keratoses 3 without a bleeding  Reappoint at patient's request

## 2015-08-02 ENCOUNTER — Telehealth: Payer: Self-pay | Admitting: Cardiology

## 2015-08-02 ENCOUNTER — Encounter: Payer: Commercial Managed Care - HMO | Admitting: Pharmacist Clinician (PhC)/ Clinical Pharmacy Specialist

## 2015-08-02 NOTE — Telephone Encounter (Signed)
Pt c/o BP issue: STAT if pt c/o blurred vision, one-sided weakness or slurred speech  1. What are your last 5 BP readings?  Today  129/115 2. Are you having any other symptoms (ex. Dizziness, headache, blurred vision, passed out)? No  3. What is your BP issue? Stated it's a little high

## 2015-08-02 NOTE — Telephone Encounter (Signed)
Left message to call back  

## 2015-08-02 NOTE — Telephone Encounter (Signed)
Spoke with patient and she only checked her blood pressure once on one arm at the 129/115 reading Before medications and asymptomatic Explained to patient if another reading like this she should check again and check in the other arm since the reading was unusual  Discussed with  [name] and he did not feel reading accurate, continue to monitor and call back if any further problems Patient aware

## 2015-08-02 NOTE — Telephone Encounter (Signed)
Follow up      Calling to let [name] know that her bp (bottom number) went down to 144/80 at around 11am.

## 2015-08-03 ENCOUNTER — Encounter: Payer: Commercial Managed Care - HMO | Admitting: Pharmacist Clinician (PhC)/ Clinical Pharmacy Specialist

## 2015-08-09 ENCOUNTER — Ambulatory Visit (INDEPENDENT_AMBULATORY_CARE_PROVIDER_SITE_OTHER): Payer: Commercial Managed Care - HMO | Admitting: Pharmacist Clinician (PhC)/ Clinical Pharmacy Specialist

## 2015-08-09 DIAGNOSIS — Z5181 Encounter for therapeutic drug level monitoring: Secondary | ICD-10-CM | POA: Diagnosis not present

## 2015-08-09 DIAGNOSIS — I482 Chronic atrial fibrillation, unspecified: Secondary | ICD-10-CM

## 2015-08-09 DIAGNOSIS — Z7901 Long term (current) use of anticoagulants: Secondary | ICD-10-CM | POA: Diagnosis not present

## 2015-08-09 DIAGNOSIS — I4891 Unspecified atrial fibrillation: Secondary | ICD-10-CM

## 2015-08-09 LAB — POCT INR: INR: 2

## 2015-08-17 ENCOUNTER — Telehealth: Payer: Self-pay | Admitting: Cardiology

## 2015-08-17 NOTE — Telephone Encounter (Signed)
Agree with advice given

## 2015-08-17 NOTE — Telephone Encounter (Signed)
SPOKE WITH   PT  HAD  EPISODE  OF ELEVATED  B/P ON   Friday  08-11-15 HAD FAMILY OVER  FOR HOLIDAYS  AND  B/P IS BETTER .MAIN  COMPLAINT AT THIS TIME  IS  FATIGUE . PER PT  IN PAST  WITH THESE EPISODES  SHE WOULD  BOUNCE BACK  QUICKER   WILL CONTINUE   TO REST IN MEANTIME AND    WILL FORWARD  TO  DR BRACKBILL FOR REVIEW./CY

## 2015-08-17 NOTE — Telephone Encounter (Signed)
Pt c/o BP issue: STAT if pt c/o blurred vision, one-sided weakness or slurred speech  1. What are your last 5 BP readings?  Today 137/74  2. Are you having any other symptoms (ex. Dizziness, headache, blurred vision, passed out)? no  3. What is your BP issue? Pt stated he bp has been high

## 2015-08-17 NOTE — Telephone Encounter (Signed)
PT  NOTIFIED  WILL CALL BACK NEXT WEEK  WITH UPDATE  HAS APPT  WITH  DR  LITTLE  TOMORROW ./CY

## 2015-08-18 DIAGNOSIS — R079 Chest pain, unspecified: Secondary | ICD-10-CM | POA: Diagnosis not present

## 2015-08-22 ENCOUNTER — Encounter: Payer: Self-pay | Admitting: Nurse Practitioner

## 2015-08-22 ENCOUNTER — Ambulatory Visit (INDEPENDENT_AMBULATORY_CARE_PROVIDER_SITE_OTHER): Payer: Commercial Managed Care - HMO | Admitting: Nurse Practitioner

## 2015-08-22 VITALS — BP 146/76 | HR 68 | Ht 60.0 in | Wt 144.8 lb

## 2015-08-22 DIAGNOSIS — I482 Chronic atrial fibrillation, unspecified: Secondary | ICD-10-CM

## 2015-08-22 DIAGNOSIS — Z7901 Long term (current) use of anticoagulants: Secondary | ICD-10-CM

## 2015-08-22 DIAGNOSIS — I4891 Unspecified atrial fibrillation: Secondary | ICD-10-CM | POA: Diagnosis not present

## 2015-08-22 DIAGNOSIS — I340 Nonrheumatic mitral (valve) insufficiency: Secondary | ICD-10-CM | POA: Diagnosis not present

## 2015-08-22 DIAGNOSIS — I5032 Chronic diastolic (congestive) heart failure: Secondary | ICD-10-CM

## 2015-08-22 DIAGNOSIS — R079 Chest pain, unspecified: Secondary | ICD-10-CM

## 2015-08-22 DIAGNOSIS — I348 Other nonrheumatic mitral valve disorders: Secondary | ICD-10-CM | POA: Diagnosis not present

## 2015-08-22 LAB — CBC
HCT: 43.4 % (ref 36.0–46.0)
Hemoglobin: 14.4 g/dL (ref 12.0–15.0)
MCH: 30.9 pg (ref 26.0–34.0)
MCHC: 33.2 g/dL (ref 30.0–36.0)
MCV: 93.1 fL (ref 78.0–100.0)
MPV: 9.4 fL (ref 8.6–12.4)
Platelets: 234 10*3/uL (ref 150–400)
RBC: 4.66 MIL/uL (ref 3.87–5.11)
RDW: 14 % (ref 11.5–15.5)
WBC: 5.4 10*3/uL (ref 4.0–10.5)

## 2015-08-22 LAB — BASIC METABOLIC PANEL
BUN: 15 mg/dL (ref 7–25)
CO2: 29 mmol/L (ref 20–31)
Calcium: 8.9 mg/dL (ref 8.6–10.4)
Chloride: 99 mmol/L (ref 98–110)
Creat: 0.72 mg/dL (ref 0.60–0.88)
Glucose, Bld: 99 mg/dL (ref 65–99)
Potassium: 4.2 mmol/L (ref 3.5–5.3)
Sodium: 138 mmol/L (ref 135–146)

## 2015-08-22 LAB — CK TOTAL AND CKMB (NOT AT ARMC)
CK, MB: 2.2 ng/mL (ref 0.5–5.0)
Relative Index: INVALID (ref 0.0–2.5)
Total CK: 81 U/L (ref 38–234)

## 2015-08-22 LAB — TROPONIN I: Troponin I: <0.03 ng/mL (ref <0.031)
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
## 2015-08-22 NOTE — Patient Instructions (Addendum)
We will be checking the following labs today - STAT CKMB, STAT troponin, BMET and CBC   Medication Instructions:    Continue with your current medicines.     Testing/Procedures To Be Arranged:  Lexiscan Myoview  Follow-Up:   See Dr. Oval Linsey as planned.     Other Special Instructions:   N/A    If you need a refill on your cardiac medications before your next appointment, please call your pharmacy.   Call the [place] office at [phone] if you have any questions, problems or concerns.

## 2015-08-23 ENCOUNTER — Telehealth (HOSPITAL_COMMUNITY): Payer: Self-pay | Admitting: *Deleted

## 2015-08-23 ENCOUNTER — Telehealth: Payer: Self-pay | Admitting: *Deleted

## 2015-08-23 NOTE — Telephone Encounter (Signed)
Called Kristen Whitehead, per Truitt Merle, NP, to let the Kristen Whitehead know that her labs are stable and we will proceed as planned with her stress test.  Kristen Whitehead verbalized understanding.

## 2015-08-23 NOTE — Telephone Encounter (Signed)
-----   Message from Burtis Junes, NP sent at 08/23/2015  7:38 AM EST ----- Ok to report. Labs are stable. Will proceed with her stress test as planned.

## 2015-08-23 NOTE — Telephone Encounter (Signed)
Patient given detailed instructions per Myocardial Perfusion Study Information Sheet for the test on 08/28/15 at 1000. Patient notified to arrive 15 minutes early and that it is imperative to arrive on time for appointment to keep from having the test rescheduled.  If you need to cancel or reschedule your appointment, please call the office within 24 hours of your appointment. Failure to do so may result in a cancellation of your appointment, and a $50 no show fee. Patient verbalized understanding.Eriel Dunckel J Clary Boulais, RN  

## 2015-08-25 ENCOUNTER — Encounter: Payer: Self-pay | Admitting: Cardiology

## 2015-08-28 ENCOUNTER — Telehealth (HOSPITAL_COMMUNITY): Payer: Self-pay | Admitting: *Deleted

## 2015-08-28 ENCOUNTER — Encounter (HOSPITAL_COMMUNITY): Payer: Commercial Managed Care - HMO

## 2015-08-31 ENCOUNTER — Ambulatory Visit: Payer: Commercial Managed Care - HMO | Admitting: Podiatry

## 2015-08-31 DIAGNOSIS — N8182 Incompetence or weakening of pubocervical tissue: Secondary | ICD-10-CM | POA: Diagnosis not present

## 2015-09-01 ENCOUNTER — Encounter (HOSPITAL_COMMUNITY): Payer: Commercial Managed Care - HMO

## 2015-09-06 ENCOUNTER — Ambulatory Visit (INDEPENDENT_AMBULATORY_CARE_PROVIDER_SITE_OTHER): Payer: Commercial Managed Care - HMO | Admitting: Pharmacist Clinician (PhC)/ Clinical Pharmacy Specialist

## 2015-09-06 ENCOUNTER — Encounter: Payer: Commercial Managed Care - HMO | Admitting: Pharmacist Clinician (PhC)/ Clinical Pharmacy Specialist

## 2015-09-06 DIAGNOSIS — Z5181 Encounter for therapeutic drug level monitoring: Secondary | ICD-10-CM | POA: Diagnosis not present

## 2015-09-06 DIAGNOSIS — I482 Chronic atrial fibrillation, unspecified: Secondary | ICD-10-CM

## 2015-09-06 DIAGNOSIS — Z7901 Long term (current) use of anticoagulants: Secondary | ICD-10-CM | POA: Diagnosis not present

## 2015-09-06 DIAGNOSIS — I4891 Unspecified atrial fibrillation: Secondary | ICD-10-CM

## 2015-09-06 LAB — POCT INR: INR: 2.5

## 2015-09-07 ENCOUNTER — Ambulatory Visit (INDEPENDENT_AMBULATORY_CARE_PROVIDER_SITE_OTHER): Payer: Commercial Managed Care - HMO | Admitting: Podiatry

## 2015-09-07 ENCOUNTER — Encounter: Payer: Self-pay | Admitting: Podiatry

## 2015-09-07 VITALS — BP 129/80 | HR 74 | Resp 16

## 2015-09-07 DIAGNOSIS — M7750 Other enthesopathy of unspecified foot: Secondary | ICD-10-CM | POA: Diagnosis not present

## 2015-09-07 DIAGNOSIS — M779 Enthesopathy, unspecified: Secondary | ICD-10-CM | POA: Diagnosis not present

## 2015-09-07 DIAGNOSIS — Q828 Other specified congenital malformations of skin: Secondary | ICD-10-CM | POA: Diagnosis not present

## 2015-09-09 NOTE — Progress Notes (Signed)
She presents today after having had her calluses trimmed about a month ago. She states they grew back so quickly now that painful again.  Objective: Vital signs are stable she is alert and oriented 3. Pulses are palpable. Multiple porokeratosis to the forefoot bilaterally particularly first and fifth. The right foot seems to be the worst pain on palpation and range of motion of the fifth metatarsophalangeal joint.  Assessment: Capsulitis fifth metatarsophalangeal joint right foot with bursitis beneath the fifth metatarsal head. Porokeratotic lesion sub-first and fifth metatarsals bilateral.  Plan: Discussed etiology pathology conservative versus surgical therapies injected dexamethasone sub-fifth metatarsophalangeal joint right foot. I also debrided her reactive hyperkeratotic lesions for her. We discussed the reason the hallux nail and nail plate and the tip of the toe may be red and sore associated with shoe gear. I will follow-up with her in a few weeks read debridement.

## 2015-09-12 ENCOUNTER — Telehealth: Payer: Self-pay | Admitting: Cardiology

## 2015-09-12 NOTE — Telephone Encounter (Signed)
New message      Pt c/o BP issue: STAT if pt c/o blurred vision, one-sided weakness or slurred speech  1. What are your last 5 BP readings? 191/106 HR 108, 206/127 HR 138 2. Are you having any other symptoms (ex. Dizziness, headache, blurred vision, passed out)?  Feels aweful  3. What is your BP issue? Pt took her bp with two different bp cuffs.  Pt feels bad and want to be seen

## 2015-09-12 NOTE — Telephone Encounter (Signed)
Spoke with patient and her blood pressure was down to 146/88 HR 69 Patient was scheduled to have a lexiscan myoview but cancelled secondary to the weather. Plus she did not want Lexiscan, prefers to walk on treadmill Patient has not been feeling well since Christmas Scheduled ov for patient to see  [name] tomorrow

## 2015-09-13 ENCOUNTER — Ambulatory Visit (INDEPENDENT_AMBULATORY_CARE_PROVIDER_SITE_OTHER): Payer: Commercial Managed Care - HMO | Admitting: Cardiology

## 2015-09-13 ENCOUNTER — Encounter: Payer: Self-pay | Admitting: Cardiology

## 2015-09-13 VITALS — BP 148/86 | HR 100 | Ht 60.0 in | Wt 144.2 lb

## 2015-09-13 DIAGNOSIS — I5032 Chronic diastolic (congestive) heart failure: Secondary | ICD-10-CM

## 2015-09-13 DIAGNOSIS — I482 Chronic atrial fibrillation, unspecified: Secondary | ICD-10-CM

## 2015-09-13 DIAGNOSIS — R079 Chest pain, unspecified: Secondary | ICD-10-CM | POA: Diagnosis not present

## 2015-09-13 MED ORDER — ISOSORBIDE MONONITRATE ER 60 MG PO TB24: 60.0000 mg | ORAL_TABLET | Freq: Every day | ORAL | Status: DC

## 2015-09-13 MED ORDER — NITROGLYCERIN 0.4 MG SL SUBL: SUBLINGUAL_TABLET | SUBLINGUAL | Status: DC

## 2015-09-13 NOTE — Patient Instructions (Signed)
Medication Instructions:  Your physician recommends that you continue on your current medications as directed. Please refer to the Current Medication list given to you today.   Labwork: none  Testing/Procedures: none  Follow-Up: Your physician recommends that you keep schedules a follow-up appointment with Dr. Oval Linsey.  Any Other Special Instructions Will Be Listed Below (If Applicable).     If you need a refill on your cardiac medications before your next appointment, please call your pharmacy.

## 2015-09-13 NOTE — Progress Notes (Signed)
Cardiology Office Note   Date:  09/13/2015   ID:  Kristen, Whitehead 27-Sep-1922, MRN [id number]  PCP:  Gennette Pac, MD  Cardiologist: Darlin Coco MD  Chief Complaint  Patient presents with  . Hypertension    Patient denies chest pain, shortness of breath, le edema, or claudication      History of Present Illness: Kristen Whitehead is a 82 y.o. female who presents for  Follow-up office visit  Kristen Whitehead is a 81 y.o. female with a hx of chronic AFib, mitral regurgitation, aortic insufficiency, HTN, diastolic HF, HL.  Admitted 3/24-3/25 with weakness and jaw and chest pain (occurred 4 days prior to presentation). Cardiac enzymes remained negative. Echo demonstrated normal LVF, mild BAE, mild AI and mild MR. Orthostatic vital signs were positive. Amlodipine was discontinued. Compression stockings were recommended.  She is on long-term warfarin for her atrial fibrillation.   she was seen as a work in on 08/22/15 by Truitt Merle NP. She was seen because of frequent episodes of chest pain and left arm pain. A Lexiscan was scheduled but subsequently was declined by the patient. She states that she did not tolerate the previous Lexiscan because of side effects from the vasodilator.  She had an echocardiogram on 11/11/14 which showed normal LV function with ejection fraction of 60-65% and no wall motion abnormalities..  He has continued to have intermittent episodes of chest discomfort. She has not tried her sublingual nitroglycerin. When she gets chest pain she prays and stays quiet and waits for the discomfort to resolve.  Past Medical History  Diagnosis Date  . History of mitral valve prolapse 06/15/2008    a. echo 1/14: mild LVH, EF 60-65%, mild MR, mild to mod BAE, PASP 35  . Chronic atrial fibrillation (HCC)     managed with rate control and coumadin  . History of congenital mitral regurgitation     mild  . Hypothyroidism   . Hypercholesterolemia     . GERD (gastroesophageal reflux disease)   . Chest pain     no known ischemic heart disease; negative Myoview July 2013. EF 75% with no ischemia.   . Hypertension   . Bladder prolapse, female, acquired   . Chronic diastolic heart failure ([place])   . Chronic anticoagulation   . Heart disease   . Cancer [facility])     thyroid    Past Surgical History  Procedure Laterality Date  . Thyroidectomy    . Cholecystectomy    . Abdominal hysterectomy    . Cataract extraction       Current Outpatient Prescriptions  Medication Sig Dispense Refill  . amoxicillin (AMOXIL) 500 MG capsule Take 2,000 mg by mouth as needed (1 hour prior to dental appointment).     Marland Kitchen atenolol (TENORMIN) 25 MG tablet Take 25 mg by mouth daily.     . beta carotene w/minerals (OCUVITE) tablet Take 1 tablet by mouth daily.    . calcium carbonate (OS-CAL - DOSED IN MG OF ELEMENTAL CALCIUM) 1250 MG tablet Take 1 tablet by mouth 2 (two) times daily.     . Cholecalciferol (VITAMIN D) 2000 UNITS tablet Take 2,000 Units by mouth daily.     . clobetasol ointment (TEMOVATE) [id number] % Apply 1 application topically daily as needed (when skin cancer is removed).   0  . estradiol (ESTRACE VAGINAL) 0.1 MG/GM vaginal cream Place 2 g vaginally once a week.     . furosemide (LASIX) 20 MG tablet Take  40 mg by mouth 2 (two) times daily. 2 tablets by mouth twice a day    . isosorbide mononitrate (IMDUR) 60 MG 24 hr tablet Take 1 tablet (60 mg total) by mouth daily. 90 tablet 3  . latanoprost (XALATAN) 0.005 % ophthalmic solution Place 1 drop into the left eye at bedtime.     Marland Kitchen levothyroxine (SYNTHROID, LEVOTHROID) 100 MCG tablet Take 100 mcg by mouth every other day. Alternating days with Levothyroxine sodium 60mcg.    . levothyroxine (SYNTHROID, LEVOTHROID) 88 MCG tablet Take 88 mcg by mouth every other day.  0  . Magnesium 100 MG CAPS Take 100 mg by mouth at bedtime.    . Magnesium 500 MG TABS Take 500 mg by mouth at bedtime.    . mupirocin  ointment (BACTROBAN) 2 % Apply 1 application topically daily.     . nitroGLYCERIN (NITROSTAT) 0.4 MG SL tablet DISSOLVE 1 TABLET UNDER THE TONGUE EVERY 5 MINUTES FOR 3 DOSES AS NEEDED  FOR  CHEST  PAIN 25 tablet 5  . potassium chloride (K-DUR,KLOR-CON) 10 MEQ tablet TAKE 2 TABLETS (20 MEQ TOTAL) BY MOUTH DAILY. 180 tablet 0  . pramipexole (MIRAPEX) 0.125 MG tablet Take 1 tablet (0.125 mg total) by mouth every evening. 90 tablet 3  . RESTASIS 0.05 % ophthalmic emulsion Place 1 drop into both eyes 2 (two) times daily.     . temazepam (RESTORIL) 15 MG capsule Take 15 mg by mouth at bedtime.    . vitamin C (ASCORBIC ACID) 500 MG tablet Take 500 mg by mouth daily.    Marland Kitchen warfarin (COUMADIN) 5 MG tablet TAKE AS DIRECTED BY ANTICOAGULATION CLINIC 90 tablet 1   No current facility-administered medications for this visit.    Allergies:   Azithromycin; Compazine; Demerol; Dolophine; Ebastine; Erythromycin; Ibuprofen; Statins; Tetanus toxoids; and Tetracyclines & related    Social History:  The patient  reports that she has never smoked. She has never used smokeless tobacco. She reports that she does not drink alcohol or use illicit drugs.   Family History:  The patient's family history includes Stroke in her father and mother. There is no history of Heart attack or Heart disease.    ROS:  Please see the history of present illness.   Otherwise, review of systems are positive for none.   All other systems are reviewed and negative.    PHYSICAL EXAM: VS:  BP 148/86 mmHg  Pulse 100  Ht 5' (1.524 m)  Wt 144 lb 3.2 oz (65.409 kg)  BMI 28.16 kg/m2 , BMI Body mass index is 28.16 kg/(m^2). GEN: Well nourished, well developed, in no acute distress HEENT: normal Neck: no JVD, carotid bruits, or masses Cardiac:  Irregularly irregular pulse. Soft apical systolic murmur of mitral regurgitation. No S3 gallop. No pitting edema. Respiratory:  clear to auscultation bilaterally, normal work of breathing GI: soft,  nontender, nondistended, + BS MS: no deformity or atrophy Skin: warm and dry, no rash Neuro:  Strength and sensation are intact Psych: euthymic mood, full affect   EKG:  EKG is ordered today. The ekg ordered today demonstrates  Atrial fibrillation with controlled ventricular response.  No ischemic changes.   Recent Labs: 11/11/2014: ALT 32; Magnesium 1.9; TSH 4.393 08/22/2015: BUN 15; Creat 0.72; Hemoglobin 14.4; Platelets 234; Potassium 4.2; Sodium 138    Lipid Panel    Component Value Date/Time   CHOL 211* 11/04/2012 1524   TRIG 201.0* 11/04/2012 1524   HDL 66.80 11/04/2012 1524   CHOLHDL  3 11/04/2012 1524   VLDL 40.2* 11/04/2012 1524   LDLCALC 109* 04/16/2012 1043   LDLDIRECT 98.8 11/04/2012 1524      Wt Readings from Last 3 Encounters:  09/13/15 144 lb 3.2 oz (65.409 kg)  08/22/15 144 lb 12.8 oz (65.681 kg)  07/24/15 141 lb (63.957 kg)        ASSESSMENT AND PLAN:  . Chronic chest pain - this has been chronic for many years with no objective findings -  On her last clinic visit a troponin was obtained and was negative.  2. Chronic atrial fib - managed with rate control and anticoagulation.   3. Chronic anticoagulation with warfarin  4. Diastolic dysfunction - seems compensated at this time. She states that she is taking 20 mg Lasix tablets and that she takes 2 tablets in the morning and 2 at night for a total of 80 mg a day.  5. Valvular heart disease - mild MR on last echo with normal LF function     Current medicines are reviewed at length with the patient today.  The patient does not have concerns regarding medicines.  The following changes have been made:  no change  Labs/ tests ordered today include:   Orders Placed This Encounter  Procedures  . EKG 12-Lead     disposition: continue current medication. Her nitroglycerin were from 2014 and we called her in the new bottle.  I encouraged her to use the sublingual nitroglycerin when she gets chest  pain. She does not want to have another [employer]. She is too frail to have a treadmill stress test. We will defer stress testing for now and treat her medically.  Will continue her isosorbide mononitrate 60 mg daily and she takes it in the evening.  Follow-up with Dr. Oval Linsey as scheduled   Signed, Darlin Coco MD 09/13/2015 12:23 PM    [place], [place], [place]  [address] Phone: [phone]; Fax: [phone]

## 2015-09-27 DIAGNOSIS — H18321 Folds in Descemet's membrane, right eye: Secondary | ICD-10-CM | POA: Diagnosis not present

## 2015-09-27 DIAGNOSIS — H18421 Band keratopathy, right eye: Secondary | ICD-10-CM | POA: Diagnosis not present

## 2015-09-27 DIAGNOSIS — H01003 Unspecified blepharitis right eye, unspecified eyelid: Secondary | ICD-10-CM | POA: Diagnosis not present

## 2015-09-27 DIAGNOSIS — H1820 Unspecified corneal edema: Secondary | ICD-10-CM | POA: Diagnosis not present

## 2015-09-28 DIAGNOSIS — N8182 Incompetence or weakening of pubocervical tissue: Secondary | ICD-10-CM | POA: Diagnosis not present

## 2015-10-04 ENCOUNTER — Encounter: Payer: Commercial Managed Care - HMO | Admitting: Pharmacist Clinician (PhC)/ Clinical Pharmacy Specialist

## 2015-10-04 ENCOUNTER — Ambulatory Visit (INDEPENDENT_AMBULATORY_CARE_PROVIDER_SITE_OTHER): Payer: Commercial Managed Care - HMO | Admitting: Pharmacist Clinician (PhC)/ Clinical Pharmacy Specialist

## 2015-10-04 DIAGNOSIS — I4891 Unspecified atrial fibrillation: Secondary | ICD-10-CM

## 2015-10-04 DIAGNOSIS — Z5181 Encounter for therapeutic drug level monitoring: Secondary | ICD-10-CM

## 2015-10-04 DIAGNOSIS — I482 Chronic atrial fibrillation, unspecified: Secondary | ICD-10-CM

## 2015-10-04 DIAGNOSIS — Z7901 Long term (current) use of anticoagulants: Secondary | ICD-10-CM | POA: Diagnosis not present

## 2015-10-04 LAB — POCT INR: INR: 2

## 2015-10-11 DIAGNOSIS — L814 Other melanin hyperpigmentation: Secondary | ICD-10-CM | POA: Diagnosis not present

## 2015-10-11 DIAGNOSIS — L720 Epidermal cyst: Secondary | ICD-10-CM | POA: Diagnosis not present

## 2015-10-11 DIAGNOSIS — Z85828 Personal history of other malignant neoplasm of skin: Secondary | ICD-10-CM | POA: Diagnosis not present

## 2015-10-25 DIAGNOSIS — H1820 Unspecified corneal edema: Secondary | ICD-10-CM | POA: Diagnosis not present

## 2015-10-25 DIAGNOSIS — H18421 Band keratopathy, right eye: Secondary | ICD-10-CM | POA: Diagnosis not present

## 2015-10-25 DIAGNOSIS — C73 Malignant neoplasm of thyroid gland: Secondary | ICD-10-CM | POA: Diagnosis not present

## 2015-10-27 DIAGNOSIS — E890 Postprocedural hypothyroidism: Secondary | ICD-10-CM | POA: Diagnosis not present

## 2015-10-27 DIAGNOSIS — Z85850 Personal history of malignant neoplasm of thyroid: Secondary | ICD-10-CM | POA: Diagnosis not present

## 2015-10-30 ENCOUNTER — Encounter: Payer: Self-pay | Admitting: Cardiovascular Disease

## 2015-10-30 ENCOUNTER — Ambulatory Visit (INDEPENDENT_AMBULATORY_CARE_PROVIDER_SITE_OTHER): Payer: Commercial Managed Care - HMO | Admitting: Cardiovascular Disease

## 2015-10-30 ENCOUNTER — Ambulatory Visit (INDEPENDENT_AMBULATORY_CARE_PROVIDER_SITE_OTHER): Payer: Commercial Managed Care - HMO | Admitting: Pharmacist Clinician (PhC)/ Clinical Pharmacy Specialist

## 2015-10-30 VITALS — BP 146/80 | HR 60 | Ht 60.0 in | Wt 146.0 lb

## 2015-10-30 DIAGNOSIS — Z7901 Long term (current) use of anticoagulants: Secondary | ICD-10-CM | POA: Diagnosis not present

## 2015-10-30 DIAGNOSIS — I5032 Chronic diastolic (congestive) heart failure: Secondary | ICD-10-CM

## 2015-10-30 DIAGNOSIS — I482 Chronic atrial fibrillation, unspecified: Secondary | ICD-10-CM

## 2015-10-30 DIAGNOSIS — R072 Precordial pain: Secondary | ICD-10-CM | POA: Diagnosis not present

## 2015-10-30 DIAGNOSIS — I4891 Unspecified atrial fibrillation: Secondary | ICD-10-CM | POA: Diagnosis not present

## 2015-10-30 DIAGNOSIS — I10 Essential (primary) hypertension: Secondary | ICD-10-CM | POA: Diagnosis not present

## 2015-10-30 DIAGNOSIS — Z5181 Encounter for therapeutic drug level monitoring: Secondary | ICD-10-CM | POA: Diagnosis not present

## 2015-10-30 LAB — POCT INR: INR: 1.4

## 2015-10-30 MED ORDER — ISOSORBIDE MONONITRATE ER 60 MG PO TB24: ORAL_TABLET | ORAL | Status: DC

## 2015-10-30 NOTE — Progress Notes (Signed)
Cardiology Office Note   Date:  10/30/2015   ID:  DERICKA WORTHAM, DOB Jan 06, 1923, MRN [id number]  PCP:  Gennette Pac, MD  Cardiologist:   Sharol Harness, MD   Chief Complaint  Patient presents with  . Follow-up    no chest pain, occassional shortness of breath, no edema, has pain or cramping in legs, occassional lightheadedness , has fatigue      History of Present Illness: Kristen Whitehead is a 82 y.o. female with chronic atrial fibrillation, hypertension, hyperlipidemia, mild MR and AR and chronic diastolic heart failure who presents for follow up.  [name] was previously a patient of [name].  She last saw him in January for follow-up on frequent episodes of chest pain. She had been referred for stress testing but declined, as she has not tolerated Lexiscan well in the past.  Troponin was checked and was negative. This is been a chronic problem so further evaluation was deferred.  Since then she continues to do well.  She reports having "heart attacks" that occur when she overexerts herself.  When this happens she gets pain across her chest that is associated with shortness of breath.  She denies lower extremity edema, orthopnea or PND.  She watched a documentary on broken heart syndrome and thinks this must be what happens to her.  When she gets the pain she prays for God to take it away and eventually the pain subsides.  There is no associated nausea or diaphoresis.  She had mild episodes of chest discomfort that occur when she tries to clean her house or move too quickly.  Her breathing has been stable.  She gets short of breath when she rushes.  Past Medical History  Diagnosis Date  . History of mitral valve prolapse 06/15/2008    a. echo 1/14: mild LVH, EF 60-65%, mild MR, mild to mod BAE, PASP 35  . Chronic atrial fibrillation (HCC)     managed with rate control and coumadin  . History of congenital mitral regurgitation     mild  . Hypothyroidism    . Hypercholesterolemia   . GERD (gastroesophageal reflux disease)   . Chest pain     no known ischemic heart disease; negative Myoview July 2013. EF 75% with no ischemia.   . Hypertension   . Bladder prolapse, female, acquired   . Chronic diastolic heart failure ([place])   . Chronic anticoagulation   . Heart disease   . Cancer [facility])     thyroid    Past Surgical History  Procedure Laterality Date  . Thyroidectomy    . Cholecystectomy    . Abdominal hysterectomy    . Cataract extraction       Current Outpatient Prescriptions  Medication Sig Dispense Refill  . amoxicillin (AMOXIL) 500 MG capsule Take 2,000 mg by mouth as needed (1 hour prior to dental appointment).     Marland Kitchen atenolol (TENORMIN) 25 MG tablet Take 25 mg by mouth daily.     . beta carotene w/minerals (OCUVITE) tablet Take 1 tablet by mouth daily.    . calcium carbonate (OS-CAL - DOSED IN MG OF ELEMENTAL CALCIUM) 1250 MG tablet Take 1 tablet by mouth 2 (two) times daily.     . Cholecalciferol (VITAMIN D) 2000 UNITS tablet Take 2,000 Units by mouth daily.     . clobetasol ointment (TEMOVATE) [id number] % Apply 1 application topically daily as needed (when skin cancer is removed).   0  . estradiol (  ESTRACE VAGINAL) 0.1 MG/GM vaginal cream Place 2 g vaginally once a week.     . furosemide (LASIX) 20 MG tablet Take 40 mg by mouth 2 (two) times daily. 2 tablets by mouth twice a day    . isosorbide mononitrate (IMDUR) 60 MG 24 hr tablet TAKE 1 AND 1/2 TABLETS BY MOUTH DAILY 145 tablet 3  . latanoprost (XALATAN) 0.005 % ophthalmic solution Place 1 drop into the left eye at bedtime.     Marland Kitchen levothyroxine (SYNTHROID, LEVOTHROID) 100 MCG tablet Take 100 mcg by mouth every other day. Alternating days with Levothyroxine sodium 49mcg.    . levothyroxine (SYNTHROID, LEVOTHROID) 88 MCG tablet Take 88 mcg by mouth every other day.  0  . Magnesium 500 MG TABS Take 1,000 mg by mouth at bedtime.     . mupirocin ointment (BACTROBAN) 2 % Apply 1  application topically daily.     . nitroGLYCERIN (NITROSTAT) 0.4 MG SL tablet DISSOLVE 1 TABLET UNDER THE TONGUE EVERY 5 MINUTES FOR 3 DOSES AS NEEDED  FOR  CHEST  PAIN 25 tablet 5  . potassium chloride (K-DUR,KLOR-CON) 10 MEQ tablet TAKE 2 TABLETS (20 MEQ TOTAL) BY MOUTH DAILY. 180 tablet 0  . pramipexole (MIRAPEX) 0.125 MG tablet Take 1 tablet (0.125 mg total) by mouth every evening. 90 tablet 3  . RESTASIS 0.05 % ophthalmic emulsion Place 1 drop into both eyes 2 (two) times daily.     . temazepam (RESTORIL) 15 MG capsule Take 15 mg by mouth at bedtime.    . vitamin C (ASCORBIC ACID) 500 MG tablet Take 500 mg by mouth daily.    Marland Kitchen warfarin (COUMADIN) 5 MG tablet TAKE AS DIRECTED BY ANTICOAGULATION CLINIC 90 tablet 1   No current facility-administered medications for this visit.    Allergies:   Azithromycin; Compazine; Demerol; Dolophine; Ebastine; Erythromycin; Ibuprofen; Statins; Tetanus toxoids; and Tetracyclines & related    Social History:  The patient  reports that she has never smoked. She has never used smokeless tobacco. She reports that she does not drink alcohol or use illicit drugs.   Family History:  The patient's family history includes Stroke in her father and mother. There is no history of Heart attack or Heart disease.    ROS:  Please see the history of present illness.   Otherwise, review of systems are positive for none.   All other systems are reviewed and negative.    PHYSICAL EXAM: VS:  BP 146/80 mmHg  Pulse 60  Ht 5' (1.524 m)  Wt 66.225 kg (146 lb)  BMI 28.51 kg/m2 , BMI Body mass index is 28.51 kg/(m^2). GENERAL:  Well appearing HEENT:  Pupils equal round and reactive, fundi not visualized, oral mucosa unremarkable NECK:  No jugular venous distention, waveform within normal limits, carotid upstroke brisk and symmetric, no bruits, no thyromegaly LYMPHATICS:  No cervical adenopathy LUNGS:  Clear to auscultation bilaterally HEART: Irregularly irregular  PMI not  displaced or sustained,S1 and S2 within normal limits, no S3, no S4, no clicks, no rubs, II/VI holosystolic murmur at the apex. ABD:  Flat, positive bowel sounds normal in frequency in pitch, no bruits, no rebound, no guarding, no midline pulsatile mass, no hepatomegaly, no splenomegaly EXT:  2 plus pulses throughout, no edema, no cyanosis no clubbing SKIN:  No rashes no nodules NEURO:  Cranial nerves II through XII grossly intact, motor grossly intact throughout PSYCH:  Cognitively intact, oriented to person place and time   EKG:  EKG is not  ordered today.  Echo 11/11/14: Study Conclusions  - Left ventricle: The cavity size was normal. Wall thickness was normal. Systolic function was normal. The estimated ejection fraction was in the range of 60% to 65%. - Aortic valve: There was mild regurgitation. - Mitral valve: Calcified annulus. Mildly thickened leaflets . There was mild regurgitation. - Left atrium: The atrium was mildly dilated. - Right atrium: The atrium was mildly dilated.  Recent Labs: 11/11/2014: ALT 32; Magnesium 1.9; TSH 4.393 08/22/2015: BUN 15; Creat 0.72; Hemoglobin 14.4; Platelets 234; Potassium 4.2; Sodium 138    Lipid Panel    Component Value Date/Time   CHOL 211* 11/04/2012 1524   TRIG 201.0* 11/04/2012 1524   HDL 66.80 11/04/2012 1524   CHOLHDL 3 11/04/2012 1524   VLDL 40.2* 11/04/2012 1524   LDLCALC 109* 04/16/2012 1043   LDLDIRECT 98.8 11/04/2012 1524      Wt Readings from Last 3 Encounters:  10/30/15 66.225 kg (146 lb)  09/13/15 65.409 kg (144 lb 3.2 oz)  08/22/15 65.681 kg (144 lb 12.8 oz)      ASSESSMENT AND PLAN:  # Chest pain: It does not seem as though Ms. Vaden has ever experienced a true myocardial infarction.  There was no prior MI on her nuclear stress in 2013 and her symptoms have not changed significantly.  She continues to have exertional symptoms and her BP is mildly elevated.  We will increase Imdur to 90 mg daily.   Continue metoprolol.  No aspirin given that she is on warfarin.  # Chronic atrial fibrillation: Rate well-controlled. She is asymptomatic.  Continue warfarin (adjusted today due to INR 1.4) and metoprolol.  This patients CHA2DS2-VASc Score and unadjusted Ischemic Stroke Rate (% per year) is equal to 4.8 % stroke rate/year from a score of 4  Above score calculated as 1 point each if present [CHF, HTN, DM, Vascular=MI/PAD/Aortic Plaque, Age if 65-74, or Female] Above score calculated as 2 points each if present [Age > 75, or Stroke/TIA/TE]   # Chronic diastolic heart failure: Ms. Nine is euvolemic on exam.  She continues to avoid salt and weighs herself daily.  # Hypertension: BP slightly elevated but she is 89.  We will be liberal with her BP control.  Goal <150/90.  Increase Imdur as above.  Current medicines are reviewed at length with the patient today.  The patient does not have concerns regarding medicines.  The following changes have been made:  Increase Imdur to 90 mg daily.  Labs/ tests ordered today include:  No orders of the defined types were placed in this encounter.     Disposition:   FU with [name] Edelen C. Oval Linsey, MD, [facility] in 6 months.    This note was written with the assistance of speech recognition software.  Please excuse any transcriptional errors.  Signed, Asa Baudoin C. Oval Linsey, MD, [facility]  10/30/2015 2:56 PM    [place]

## 2015-10-30 NOTE — Patient Instructions (Addendum)
Medication Instructions:  INCREASE YOUR ISOSORBIDE TO 90 MG DAILY. TAKE 1 AND 1/2 TABLETS OF THE 60 MG  RX SENT TO MAIL ORDER   Labwork: NONE  Testing/Procedures: NONE  Follow-Up: Your physician wants you to follow-up in: [place] will receive a reminder letter in the mail two months in advance. If you don't receive a letter, please call our office to schedule the follow-up appointment.  If you need a refill on your cardiac medications before your next appointment, please call your pharmacy.

## 2015-10-31 DIAGNOSIS — N8182 Incompetence or weakening of pubocervical tissue: Secondary | ICD-10-CM | POA: Diagnosis not present

## 2015-11-07 ENCOUNTER — Encounter: Payer: Self-pay | Admitting: Podiatry

## 2015-11-07 ENCOUNTER — Ambulatory Visit: Payer: Commercial Managed Care - HMO | Admitting: Podiatry

## 2015-11-07 ENCOUNTER — Ambulatory Visit (INDEPENDENT_AMBULATORY_CARE_PROVIDER_SITE_OTHER): Payer: Commercial Managed Care - HMO | Admitting: Podiatry

## 2015-11-07 DIAGNOSIS — Q828 Other specified congenital malformations of skin: Secondary | ICD-10-CM

## 2015-11-07 DIAGNOSIS — B351 Tinea unguium: Secondary | ICD-10-CM

## 2015-11-07 DIAGNOSIS — M79676 Pain in unspecified toe(s): Secondary | ICD-10-CM

## 2015-11-07 NOTE — Progress Notes (Signed)
She presents today with a chief complaint of painful elongated toenails and corns and calluses.  Objective: Vital signs are stable she is alert and oriented 3 pulses are palpable with slightly delayed capillary fill time. Her toenails are thick yellow dystrophic clinically mycotic and painful on palpation as well as debridement reactive hyperkeratosis plantar aspect of the bilateral foot are also painful.  Assessment: Pain in limb secondary to onychomycosis and porokeratosis bilateral.  Plan: Debridement of toenails 1 through 5 bilateral. Debridement of all reactive hyperkeratosis bilateral. Follow up with her in 2 months.

## 2015-11-14 ENCOUNTER — Ambulatory Visit (INDEPENDENT_AMBULATORY_CARE_PROVIDER_SITE_OTHER): Payer: Commercial Managed Care - HMO | Admitting: Pharmacist Clinician (PhC)/ Clinical Pharmacy Specialist

## 2015-11-14 DIAGNOSIS — I4891 Unspecified atrial fibrillation: Secondary | ICD-10-CM | POA: Diagnosis not present

## 2015-11-14 DIAGNOSIS — Z5181 Encounter for therapeutic drug level monitoring: Secondary | ICD-10-CM

## 2015-11-14 DIAGNOSIS — I482 Chronic atrial fibrillation, unspecified: Secondary | ICD-10-CM

## 2015-11-14 DIAGNOSIS — Z7901 Long term (current) use of anticoagulants: Secondary | ICD-10-CM

## 2015-11-14 LAB — POCT INR: INR: 3.9

## 2015-11-24 ENCOUNTER — Telehealth: Payer: Self-pay | Admitting: Cardiovascular Disease

## 2015-11-24 MED ORDER — FUROSEMIDE 20 MG PO TABS: 40.0000 mg | ORAL_TABLET | Freq: Two times a day (BID) | ORAL | Status: DC

## 2015-11-24 NOTE — Telephone Encounter (Signed)
Former [place] pt, neeed refill for furosemide for current dose. Refill sent. Advised to call again if further needs.

## 2015-11-24 NOTE — Telephone Encounter (Signed)
New Message  Pt c/o medication issue: 1. Name of Medication: furosemide (LASIX) 20 MG tablet  4. What is your medication issue? The dosage has changed again and she finds that she runs out fairly fast. Please call back to discuss

## 2015-11-28 ENCOUNTER — Ambulatory Visit (INDEPENDENT_AMBULATORY_CARE_PROVIDER_SITE_OTHER): Payer: Commercial Managed Care - HMO | Admitting: Pharmacist Clinician (PhC)/ Clinical Pharmacy Specialist

## 2015-11-28 DIAGNOSIS — Z7901 Long term (current) use of anticoagulants: Secondary | ICD-10-CM

## 2015-11-28 DIAGNOSIS — I482 Chronic atrial fibrillation, unspecified: Secondary | ICD-10-CM

## 2015-11-28 DIAGNOSIS — Z5181 Encounter for therapeutic drug level monitoring: Secondary | ICD-10-CM | POA: Diagnosis not present

## 2015-11-28 DIAGNOSIS — I4891 Unspecified atrial fibrillation: Secondary | ICD-10-CM | POA: Diagnosis not present

## 2015-11-28 DIAGNOSIS — N8182 Incompetence or weakening of pubocervical tissue: Secondary | ICD-10-CM | POA: Diagnosis not present

## 2015-11-28 LAB — POCT INR: INR: 2.9

## 2015-12-04 DIAGNOSIS — H35342 Macular cyst, hole, or pseudohole, left eye: Secondary | ICD-10-CM | POA: Diagnosis not present

## 2015-12-04 DIAGNOSIS — H18421 Band keratopathy, right eye: Secondary | ICD-10-CM | POA: Diagnosis not present

## 2015-12-04 DIAGNOSIS — H1820 Unspecified corneal edema: Secondary | ICD-10-CM | POA: Diagnosis not present

## 2015-12-04 DIAGNOSIS — H401131 Primary open-angle glaucoma, bilateral, mild stage: Secondary | ICD-10-CM | POA: Diagnosis not present

## 2015-12-13 ENCOUNTER — Other Ambulatory Visit: Payer: Self-pay | Admitting: *Deleted

## 2015-12-13 MED ORDER — POTASSIUM CHLORIDE CRYS ER 10 MEQ PO TBCR: EXTENDED_RELEASE_TABLET | ORAL | Status: DC

## 2015-12-20 ENCOUNTER — Ambulatory Visit (INDEPENDENT_AMBULATORY_CARE_PROVIDER_SITE_OTHER): Payer: Commercial Managed Care - HMO | Admitting: Pharmacist

## 2015-12-20 DIAGNOSIS — I482 Chronic atrial fibrillation, unspecified: Secondary | ICD-10-CM

## 2015-12-20 DIAGNOSIS — Z7901 Long term (current) use of anticoagulants: Secondary | ICD-10-CM

## 2015-12-20 DIAGNOSIS — Z5181 Encounter for therapeutic drug level monitoring: Secondary | ICD-10-CM | POA: Diagnosis not present

## 2015-12-20 DIAGNOSIS — I4891 Unspecified atrial fibrillation: Secondary | ICD-10-CM | POA: Diagnosis not present

## 2015-12-20 LAB — POCT INR: INR: 2.5

## 2015-12-21 DIAGNOSIS — H5203 Hypermetropia, bilateral: Secondary | ICD-10-CM | POA: Diagnosis not present

## 2015-12-21 DIAGNOSIS — Z0100 Encounter for examination of eyes and vision without abnormal findings: Secondary | ICD-10-CM | POA: Diagnosis not present

## 2015-12-21 DIAGNOSIS — H52209 Unspecified astigmatism, unspecified eye: Secondary | ICD-10-CM | POA: Diagnosis not present

## 2015-12-28 DIAGNOSIS — N8182 Incompetence or weakening of pubocervical tissue: Secondary | ICD-10-CM | POA: Diagnosis not present

## 2016-01-09 ENCOUNTER — Encounter: Payer: Self-pay | Admitting: Podiatry

## 2016-01-09 ENCOUNTER — Ambulatory Visit (INDEPENDENT_AMBULATORY_CARE_PROVIDER_SITE_OTHER): Payer: Commercial Managed Care - HMO | Admitting: Podiatry

## 2016-01-09 DIAGNOSIS — B351 Tinea unguium: Secondary | ICD-10-CM | POA: Diagnosis not present

## 2016-01-09 DIAGNOSIS — M79676 Pain in unspecified toe(s): Secondary | ICD-10-CM | POA: Diagnosis not present

## 2016-01-09 DIAGNOSIS — Q828 Other specified congenital malformations of skin: Secondary | ICD-10-CM | POA: Diagnosis not present

## 2016-01-09 NOTE — Progress Notes (Signed)
She presents today with chief complaint of painful elongated toenails and calluses.  Objective: All signs are stable she is alert and oriented 3. Pulses are palpable. Neurologic sensorium is intact. The tendon reflexes are intact. Toenails are thick yellow dystrophic onychomycotic. Porokeratotic lesion sub-fifth and subsecond metatarsophalangeal joint of the right foot.  Assessment: Pain and limps a Onychomycosis porokeratosis.  Plan: Debridement of all reactive hyperkeratosis and debridement of toenails 1 through 5 bilateral.

## 2016-01-17 ENCOUNTER — Ambulatory Visit (INDEPENDENT_AMBULATORY_CARE_PROVIDER_SITE_OTHER): Payer: Commercial Managed Care - HMO | Admitting: Pharmacist

## 2016-01-17 DIAGNOSIS — I4891 Unspecified atrial fibrillation: Secondary | ICD-10-CM | POA: Diagnosis not present

## 2016-01-17 DIAGNOSIS — I482 Chronic atrial fibrillation, unspecified: Secondary | ICD-10-CM

## 2016-01-17 DIAGNOSIS — Z5181 Encounter for therapeutic drug level monitoring: Secondary | ICD-10-CM

## 2016-01-17 DIAGNOSIS — Z7901 Long term (current) use of anticoagulants: Secondary | ICD-10-CM

## 2016-01-17 LAB — POCT INR: INR: 4.1

## 2016-01-23 ENCOUNTER — Ambulatory Visit (INDEPENDENT_AMBULATORY_CARE_PROVIDER_SITE_OTHER): Payer: Commercial Managed Care - HMO | Admitting: Neurology

## 2016-01-23 ENCOUNTER — Encounter: Payer: Self-pay | Admitting: Neurology

## 2016-01-23 VITALS — BP 132/68 | HR 62 | Resp 16 | Ht 60.0 in | Wt 140.0 lb

## 2016-01-23 DIAGNOSIS — G2581 Restless legs syndrome: Secondary | ICD-10-CM | POA: Diagnosis not present

## 2016-01-23 DIAGNOSIS — G609 Hereditary and idiopathic neuropathy, unspecified: Secondary | ICD-10-CM | POA: Diagnosis not present

## 2016-01-23 NOTE — Patient Instructions (Signed)
We will continue with low dose pramipexole.  I will see you back in about 6 months.  I am glad you feel good.

## 2016-01-23 NOTE — Progress Notes (Signed)
Subjective:    Patient ID: Kristen Whitehead is a 82 y.o. female.  HPI     Interim history:  Kristen Whitehead is a very pleasant 82 year old right-handed woman with an underlying medical history of hypertension, hyperlipidemia, atrial fibrillation, celiac disease, scoliosis, reflux disease, who presents for followup consultation of her bilateral foot pain, likely RLS. She is unaccompanied today. I last saw her on 07/24/2015, at which time she reported feeling stable. She felt that there was tingling in her legs and she had a burning sensation in her feet. She did feel that Mirapex was helpful. She had some intermittent tingling in her fingers. She also reported feeling more forgetful. I suggested we continue with low dose pramipexole 0.125 mg each night.  Today, 01/23/2016: She reports doing well, no recent pain in hands or feet, no cramps, no tingling. Some SOB at times. Saw Dr. Oval Linsey in Cardiology recently in 3/17, and I reviewed the note. Her Imdur was increased. She has some short term memory issues and some word finding issues, which friends don't notice, but she does. Stays active.   Previously:   I saw her on 01/20/2015 at which time she reported feeling stable for the most part. Her burning sensation seemed about the same and her leg cramping seemed a little less overall. She was tolerating low-dose Mirapex. She saw her cardiologist 2 days prior. She had presented to the emergency room on 11/10/2014 with chest pain and dizziness. She was admitted for workup. Cardiac workup was negative. She has A. Fib, rate controlled. She has stable lower extremity swelling and was reminded by her cardiologist to use compression stockings. She was not drinking enough water. I asked her to continue low-dose Mirapex each night.  I saw her on 07/21/2014, at which time she reported a fall about 3 months prior. She had tripped over something. She had no significant injuries and no sequelae. She was not  drinking enough water. Her leg swelling was stable. I suggested she continue with low-dose Mirapex, 0.125 mg once nightly.  I saw her on 12/27/2013, at which time she noted ankle swelling. She had gone into cardiac failure the year before and felt similar to that. Nevertheless, because of her lower extremity swelling I suggested she cut back on Mirapex to 0.125 mg each night and see if the swelling improves. If she had flareup in her restless leg symptoms I suggested she could go back to 0.25 mg nightly.   I saw her on 06/28/2013, at which time I noted on clinical exam that she had mild evidence of neuropathy in the absence of a history of diabetes with normal hemoglobin A1c noted and normal B12 levels. She had EMG and nerve conduction studies that did not show any significant neuropathy. I felt that she had most likely RLS. She had done well with Mirapex. I did not make any changes to her medication regimen and asked her to continue taking Mirapex 0.25 mg each night. She did not have any side effects at the time.  I first met her on 02/17/2013, at which time I suggested EMG and nerve conduction testing and a treatment trial with Mirapex because of possible underlying RLS. She has previously seen a podiatrist who had done a nerve biopsy which she reported was normal. She has a history of A. fib. She has a history of congestive heart failure and is followed by cardiology. Echocardiogram in January 2014 showed biatrial enlargement and mild mitral regurgitation and an EF of 60-65% with  diastolic dysfunction.   For years she has had burning in her feet and started having a crawling sensation in her distal legs in 2014. She has also had cramping in her muscles for years and she was taken off of her statin in 2013 and her cramping eventually became better. She has had pins and needles sensation and it helps to walk around. She has worse symptoms at night or by the end of the day. She remembers having "terrible  growing pains" as a child and does not have a FHx of RLS as far as she knows. She had nerve biopsies both legs by her podiatrist, which did not show inflammation. While she has a history of chronic back pain she denied any radiating back pain to her legs. In fact she described pins and needle and crawling sensation in the distal lower extremities, radiating upwards. She has been taking OTC Mg supplements, which helped her cramps.    Her EMG and nerve conduction testing from 03/03/2013 showed no evidence of peripheral neuropathy. We called her with her test results. Her foot pain and paresthesias improved.   Her Past Medical History Is Significant For: Past Medical History  Diagnosis Date  . History of mitral valve prolapse 06/15/2008    a. echo 1/14: mild LVH, EF 60-65%, mild MR, mild to mod BAE, PASP 35  . Chronic atrial fibrillation (HCC)     managed with rate control and coumadin  . History of congenital mitral regurgitation     mild  . Hypothyroidism   . Hypercholesterolemia   . GERD (gastroesophageal reflux disease)   . Chest pain     no known ischemic heart disease; negative Myoview July 2013. EF 75% with no ischemia.   . Hypertension   . Bladder prolapse, female, acquired   . Chronic diastolic heart failure ([place])   . Chronic anticoagulation   . Heart disease   . Cancer [facility])     thyroid    Her Past Surgical History Is Significant For: Past Surgical History  Procedure Laterality Date  . Thyroidectomy    . Cholecystectomy    . Abdominal hysterectomy    . Cataract extraction      Her Family History Is Significant For: Family History  Problem Relation Age of Onset  . Heart attack Neg Hx   . Heart disease Neg Hx   . Stroke Mother   . Stroke Father     Her Social History Is Significant For: Social History   Social History  . Marital Status: Widowed    Spouse Name: N/A  . Number of Children: 3  . Years of Education: BA   Occupational History  .     Social  History Main Topics  . Smoking status: Never Smoker   . Smokeless tobacco: Never Used  . Alcohol Use: No  . Drug Use: No  . Sexual Activity: No   Other Topics Concern  . None   Social History Narrative   Pt lives at home alone.   Caffeine Use: quit 79yr ago    Her Allergies Are:  Allergies  Allergen Reactions  . Azithromycin Nausea Only  . Compazine [Prochlorperazine Edisylate] Nausea Only  . Demerol Nausea Only  . Dolophine [Methadone] Nausea Only  . Ebastine Nausea Only    EBS  . Erythromycin Nausea Only  . Ibuprofen Nausea Only  . Statins Other (See Comments)    myalgias  . Tetanus Toxoids Nausea Only  . Tetracyclines & Related Nausea Only  :  Her Current Medications Are:  Outpatient Encounter Prescriptions as of 01/23/2016  Medication Sig  . atenolol (TENORMIN) 25 MG tablet Take 25 mg by mouth daily.   . beta carotene w/minerals (OCUVITE) tablet Take 1 tablet by mouth daily.  . calcium carbonate (OS-CAL - DOSED IN MG OF ELEMENTAL CALCIUM) 1250 MG tablet Take 1 tablet by mouth 2 (two) times daily.   . Cholecalciferol (VITAMIN D) 2000 UNITS tablet Take 2,000 Units by mouth daily.   . clobetasol ointment (TEMOVATE) 4.74 % Apply 1 application topically daily as needed (when skin cancer is removed).   Marland Kitchen estradiol (ESTRACE VAGINAL) 0.1 MG/GM vaginal cream Place 2 g vaginally once a week.   . furosemide (LASIX) 20 MG tablet Take 2 tablets (40 mg total) by mouth 2 (two) times daily. 2 tablets by mouth twice a day  . isosorbide mononitrate (IMDUR) 60 MG 24 hr tablet TAKE 1 AND 1/2 TABLETS BY MOUTH DAILY  . latanoprost (XALATAN) 0.005 % ophthalmic solution Place 1 drop into the left eye at bedtime.   Marland Kitchen levothyroxine (SYNTHROID, LEVOTHROID) 100 MCG tablet Take 100 mcg by mouth every other day. Alternating days with Levothyroxine sodium 28mg.  . levothyroxine (SYNTHROID, LEVOTHROID) 88 MCG tablet Take 88 mcg by mouth every other day.  . Magnesium 500 MG TABS Take 1,000 mg by  mouth at bedtime.   . nitroGLYCERIN (NITROSTAT) 0.4 MG SL tablet DISSOLVE 1 TABLET UNDER THE TONGUE EVERY 5 MINUTES FOR 3 DOSES AS NEEDED  FOR  CHEST  PAIN  . potassium chloride (K-DUR,KLOR-CON) 10 MEQ tablet TAKE 2 TABLETS (20 MEQ TOTAL) BY MOUTH DAILY.  .Marland Kitchenpramipexole (MIRAPEX) 0.125 MG tablet Take 1 tablet (0.125 mg total) by mouth every evening.  . RESTASIS 0.05 % ophthalmic emulsion Place 1 drop into both eyes 2 (two) times daily.   . temazepam (RESTORIL) 15 MG capsule Take 15 mg by mouth at bedtime.  . vitamin C (ASCORBIC ACID) 500 MG tablet Take 500 mg by mouth daily.  .Marland Kitchenwarfarin (COUMADIN) 5 MG tablet TAKE AS DIRECTED BY ANTICOAGULATION CLINIC   No facility-administered encounter medications on file as of 01/23/2016.  :  Review of Systems:  Out of a complete 14 point review of systems, all are reviewed and negative with the exception of these symptoms as listed below:  Review of Systems  Neurological:       Patient feels like she is having some trouble with remembering words in conversation.      Objective:  Neurologic Exam  Physical Exam Physical Examination:   Filed Vitals:   01/23/16 1049  BP: 132/68  Pulse: 62  Resp: 16   General Examination: The patient is a very pleasant 82y.o. female in no acute distress. She appears well-developed and well-nourished and well groomed. She is in good spirits today.   HEENT: Normocephalic, atraumatic, pupils are equal, round and reactive to light and accommodation. She is s/p bilateral cataract repairs. Extraocular tracking is good without limitation to gaze excursion or nystagmus noted. Normal smooth pursuit is noted. Hearing is grossly intact. Face is symmetric with normal facial animation and normal facial sensation. Speech is clear with no dysarthria noted. There is no hypophonia. There is no lip, neck/head, jaw or voice tremor. Neck is supple with full range of passive and active motion. There are no carotid bruits on auscultation.  Oropharynx exam reveals: mild mouth dryness, adequate dental hygiene and no airway crowding. Tongue protrudes centrally and palate elevates symmetrically.    Chest: Clear  to auscultation without wheezing, rhonchi or crackles noted.  Heart: S1+S2+0, irregularly irregular, no murmur noted.    Abdomen: Soft, non-tender and non-distended with normal bowel sounds appreciated on auscultation.  Extremities: There is no edema. Pedal pulses are intact.  Skin: Warm and dry without trophic changes noted. There are no varicose veins.  Musculoskeletal: exam reveals no obvious joint deformities, tenderness or joint swelling or erythema.   Neurologically:  Mental status: The patient is awake, alert and oriented in all 4 spheres. Her memory, attention, language and knowledge are appropriate. There is no aphasia, agnosia, apraxia or anomia. Speech is clear with normal prosody and enunciation. Thought process is linear. Mood is congruent and affect is normal.  Cranial nerves are as described above under HEENT exam. In addition, shoulder shrug is normal with equal shoulder height noted. Motor exam: Normal bulk, strength and tone is noted. There is no drift, tremor or rebound. Romberg is negative. Reflexes are 1+ in the UEs and trace in the knees and absent in both ankles. Fine motor skills are intact.  Cerebellar testing shows no dysmetria or intention tremor on finger to nose testing. There is no truncal or gait ataxia.  Sensory exam is stable, mildly decreased to all modalities in the distal LEs.  Gait, station and balance: she stands with no significant difficulty. No veering to one side is noted. No leaning to one side is noted. Posture is age-appropriate and stance is slightly wide-based. She walks cautiously. She does not use any assistive device.   Assessment and Plan:   In summary, Kristen Whitehead is a very pleasant 82 year old female with an underlying medical history of hypertension,  hyperlipidemia, atrial fibrillation, celiac disease, scoliosis, reflux disease, whoPresents for follow-up consultation of her foot pain. Her history and physical exam are in keeping with mild and stable neuropathy as well as restless leg syndrome. She has been able to tolerate low dose generic Mirapex and has had improvement in her symptoms. She reports that she had no recent foot pain and no cramping, and no tingling. She is advised to continue with low dose pramipexole 0.125 mg each night. She has enough refills for the next 6 months. I would like to see her back in 6 months, sooner if needed. She is advised that for her forgetfulness, this could very well be age-appropriate and that we will monitor. Physical exam is stable.  I answered all her questions today and she was in agreement. I spent 25 minutes in total face-to-face time with the patient, more than 50% of which was spent in counseling and coordination of care, reviewing test results, reviewing medication and discussing or reviewing the diagnosis of PN, RLS, the prognosis and treatment options.

## 2016-01-30 ENCOUNTER — Telehealth: Payer: Self-pay | Admitting: Cardiovascular Disease

## 2016-01-30 NOTE — Telephone Encounter (Signed)
Per triage protocol, if patient gains >3lbs in 24 hours, increase diuretic dose x1 Patient gained 2lbs - advised to increase lasix by 20mg  this afternoon. She takes her PM dose around 4 - she will take 60mg  instead of 40mg   Informed her we will contact her with further advice per the MD

## 2016-01-30 NOTE — Telephone Encounter (Signed)
Returned call to patient.  She gained 2lbs 2oz since yesterday Patient had chocolate almond milk, had a [employer] thickburger for the past 2 days Patient has swelling in her stomach - she has not eaten today - has had 2 glasses of the chocolate almond milk She has only been eating 1-2 meals daily and has not lost weight - ranges from 140-144lbs (she has never gained 4lbs overnight) Patient takes lasix 40mg  BID Patient denies SOB, chest pain, palpitations Patient has not done much today - she kept her 2 great-grandsons yesterday   Informed patient I would send the message to Dr. Oval Linsey to review and advise on her symptoms

## 2016-01-30 NOTE — Telephone Encounter (Signed)
Pt c/o swelling: STAT is pt has developed SOB within 24 hours  1. How long have you been experiencing swelling? She gained 2.4 lbs overnight   2. Where is the swelling located? She did not specify   3.  Are you currently taking a "fluid pill"? Furosemide 20mg  ( 2 in am and 2 in pm)   4.  Are you currently SOB? She did not state.   5.  Have you traveled recently?N/A

## 2016-01-31 ENCOUNTER — Ambulatory Visit (INDEPENDENT_AMBULATORY_CARE_PROVIDER_SITE_OTHER): Payer: Commercial Managed Care - HMO | Admitting: Pharmacist

## 2016-01-31 DIAGNOSIS — I4891 Unspecified atrial fibrillation: Secondary | ICD-10-CM

## 2016-01-31 DIAGNOSIS — I482 Chronic atrial fibrillation, unspecified: Secondary | ICD-10-CM

## 2016-01-31 DIAGNOSIS — Z5181 Encounter for therapeutic drug level monitoring: Secondary | ICD-10-CM

## 2016-01-31 DIAGNOSIS — Z7901 Long term (current) use of anticoagulants: Secondary | ICD-10-CM | POA: Diagnosis not present

## 2016-01-31 LAB — POCT INR: INR: 3.4

## 2016-01-31 NOTE — Telephone Encounter (Signed)
Agree with this plan.  If her weight is not back at baseline, would increase today's dose by 20 mg as well.

## 2016-02-01 DIAGNOSIS — N8182 Incompetence or weakening of pubocervical tissue: Secondary | ICD-10-CM | POA: Diagnosis not present

## 2016-02-01 NOTE — Telephone Encounter (Signed)
Left message for pt to call.

## 2016-02-02 NOTE — Telephone Encounter (Signed)
Pt returning call-had bad night last night-thinks she was dehydrated--feels weak this am-denies any other symptoms-pls call  [phone]

## 2016-02-02 NOTE — Telephone Encounter (Signed)
Spoke with pt, she developed a fever and shaking last night. She is having nausea for the last 3 days. Her bp was 138/68 today. She does report being down 1 lb, and her swelling is better. Fluid restrictions discussed because she feels she maybe dehydrated. She will call with further problems.

## 2016-02-13 ENCOUNTER — Other Ambulatory Visit: Payer: Self-pay

## 2016-02-13 ENCOUNTER — Other Ambulatory Visit: Payer: Self-pay | Admitting: Pharmacist

## 2016-02-13 MED ORDER — WARFARIN SODIUM 5 MG PO TABS: ORAL_TABLET | ORAL | Status: DC

## 2016-02-13 MED ORDER — NITROGLYCERIN 0.4 MG SL SUBL: SUBLINGUAL_TABLET | SUBLINGUAL | Status: DC

## 2016-02-13 MED ORDER — FUROSEMIDE 20 MG PO TABS: 40.0000 mg | ORAL_TABLET | Freq: Two times a day (BID) | ORAL | Status: DC

## 2016-02-13 MED ORDER — ISOSORBIDE MONONITRATE ER 60 MG PO TB24: ORAL_TABLET | ORAL | Status: DC

## 2016-02-13 MED ORDER — ATENOLOL 25 MG PO TABS: 25.0000 mg | ORAL_TABLET | Freq: Every day | ORAL | Status: DC

## 2016-02-15 ENCOUNTER — Other Ambulatory Visit: Payer: Self-pay | Admitting: Pharmacist Clinician (PhC)/ Clinical Pharmacy Specialist

## 2016-02-15 MED ORDER — WARFARIN SODIUM 5 MG PO TABS: ORAL_TABLET | ORAL | Status: DC

## 2016-02-22 ENCOUNTER — Ambulatory Visit (INDEPENDENT_AMBULATORY_CARE_PROVIDER_SITE_OTHER): Payer: Commercial Managed Care - HMO | Admitting: Pharmacist Clinician (PhC)/ Clinical Pharmacy Specialist

## 2016-02-22 DIAGNOSIS — Z7901 Long term (current) use of anticoagulants: Secondary | ICD-10-CM | POA: Diagnosis not present

## 2016-02-22 DIAGNOSIS — Z5181 Encounter for therapeutic drug level monitoring: Secondary | ICD-10-CM

## 2016-02-22 DIAGNOSIS — I482 Chronic atrial fibrillation, unspecified: Secondary | ICD-10-CM

## 2016-02-22 DIAGNOSIS — I4891 Unspecified atrial fibrillation: Secondary | ICD-10-CM | POA: Diagnosis not present

## 2016-02-22 LAB — POCT INR: INR: 2

## 2016-02-22 MED ORDER — ISOSORBIDE MONONITRATE ER 30 MG PO TB24: 90.0000 mg | ORAL_TABLET | Freq: Every day | ORAL | Status: DC

## 2016-02-27 DIAGNOSIS — N8182 Incompetence or weakening of pubocervical tissue: Secondary | ICD-10-CM | POA: Diagnosis not present

## 2016-03-21 ENCOUNTER — Ambulatory Visit: Payer: Commercial Managed Care - HMO

## 2016-03-25 ENCOUNTER — Ambulatory Visit (INDEPENDENT_AMBULATORY_CARE_PROVIDER_SITE_OTHER): Payer: Commercial Managed Care - HMO | Admitting: Pharmacist Clinician (PhC)/ Clinical Pharmacy Specialist

## 2016-03-25 DIAGNOSIS — Z5181 Encounter for therapeutic drug level monitoring: Secondary | ICD-10-CM

## 2016-03-25 DIAGNOSIS — I482 Chronic atrial fibrillation, unspecified: Secondary | ICD-10-CM

## 2016-03-25 DIAGNOSIS — Z7901 Long term (current) use of anticoagulants: Secondary | ICD-10-CM

## 2016-03-25 DIAGNOSIS — I4891 Unspecified atrial fibrillation: Secondary | ICD-10-CM

## 2016-03-25 LAB — POCT INR: INR: 1.9

## 2016-03-26 DIAGNOSIS — N8182 Incompetence or weakening of pubocervical tissue: Secondary | ICD-10-CM | POA: Diagnosis not present

## 2016-03-28 DIAGNOSIS — R509 Fever, unspecified: Secondary | ICD-10-CM | POA: Diagnosis not present

## 2016-03-28 DIAGNOSIS — R5383 Other fatigue: Secondary | ICD-10-CM | POA: Diagnosis not present

## 2016-03-28 DIAGNOSIS — R8290 Unspecified abnormal findings in urine: Secondary | ICD-10-CM | POA: Diagnosis not present

## 2016-04-04 DIAGNOSIS — H401111 Primary open-angle glaucoma, right eye, mild stage: Secondary | ICD-10-CM | POA: Diagnosis not present

## 2016-04-04 DIAGNOSIS — H18321 Folds in Descemet's membrane, right eye: Secondary | ICD-10-CM | POA: Diagnosis not present

## 2016-04-04 DIAGNOSIS — H401121 Primary open-angle glaucoma, left eye, mild stage: Secondary | ICD-10-CM | POA: Diagnosis not present

## 2016-04-04 DIAGNOSIS — H18421 Band keratopathy, right eye: Secondary | ICD-10-CM | POA: Diagnosis not present

## 2016-04-17 DIAGNOSIS — Z23 Encounter for immunization: Secondary | ICD-10-CM | POA: Diagnosis not present

## 2016-04-17 DIAGNOSIS — W57XXXA Bitten or stung by nonvenomous insect and other nonvenomous arthropods, initial encounter: Secondary | ICD-10-CM | POA: Diagnosis not present

## 2016-04-17 DIAGNOSIS — S80862A Insect bite (nonvenomous), left lower leg, initial encounter: Secondary | ICD-10-CM | POA: Diagnosis not present

## 2016-04-17 DIAGNOSIS — S80861A Insect bite (nonvenomous), right lower leg, initial encounter: Secondary | ICD-10-CM | POA: Diagnosis not present

## 2016-04-22 DIAGNOSIS — B880 Other acariasis: Secondary | ICD-10-CM | POA: Diagnosis not present

## 2016-04-22 DIAGNOSIS — L03115 Cellulitis of right lower limb: Secondary | ICD-10-CM | POA: Diagnosis not present

## 2016-04-23 ENCOUNTER — Ambulatory Visit (INDEPENDENT_AMBULATORY_CARE_PROVIDER_SITE_OTHER): Payer: Commercial Managed Care - HMO | Admitting: Pharmacist

## 2016-04-23 DIAGNOSIS — I482 Chronic atrial fibrillation, unspecified: Secondary | ICD-10-CM

## 2016-04-23 DIAGNOSIS — Z5181 Encounter for therapeutic drug level monitoring: Secondary | ICD-10-CM

## 2016-04-23 DIAGNOSIS — N8182 Incompetence or weakening of pubocervical tissue: Secondary | ICD-10-CM | POA: Diagnosis not present

## 2016-04-23 LAB — POCT INR: INR: 1.4

## 2016-05-05 NOTE — Progress Notes (Signed)
Cardiology Office Note   Date:  05/06/2016   ID:  Kristen Whitehead, DOB 08/25/1922, MRN [id number]  PCP:  Kristen Pac, MD  Cardiologist:   Kristen Latch, MD   Chief Complaint  Patient presents with  . Follow-up    6 Months; sob; when exerting self.       History of Present Illness: Kristen Whitehead is a 82 y.o. female with chronic atrial fibrillation, hypertension, hyperlipidemia, mild MR and AR and chronic diastolic heart failure who presents for follow up.  Kristen Whitehead was previously a patient of [name].  She last saw him 08/2015 for follow-up on frequent episodes of chest pain. She had been referred for stress testing but declined, as she has not tolerated Lexiscan well in the past. Troponin was checked and was negative. This is been a chronic problem so further evaluation was deferred.  Her stress test in 2013 was negative for ischemia.  She reports having "heart attacks" that occur when she overexerts herself.  When this happens she gets pain across her chest that is associated with shortness of breath.  She denies lower extremity edema, orthopnea or PND.  She watched a documentary on broken heart syndrome and thinks this must be what happens to her.  When she gets the pain she prays for God to take it away and eventually the pain subsides.  There is no associated nausea or diaphoresis.    At her appointment 10/2015 Imdur was increased to 90 mg daily.  Since then she had an episode of heart failure 01/2016 that responded to increased lasix.  At times her weight increases by up to 2lb in a day and she notes abdominal distension.  [name] reports that she has been feeling well.  She is no longer cleaning her own house, so she denies recent chest pain or shortness of breath.  Her only complaint is a rash that developed after [place] the [place].  She has not noted any lower extremity edema, orthopnea or PND.  She notes occasional episodes of nausea but no  emesis.  She notes that her blood pressure has been well-controlled.     Past Medical History:  Diagnosis Date  . Bladder prolapse, female, acquired   . Cancer ([place])    thyroid  . Chest pain    no known ischemic heart disease; negative Myoview July 2013. EF 75% with no ischemia.   . Chronic anticoagulation   . Chronic atrial fibrillation ([facility])    managed with rate control and coumadin  . Chronic diastolic heart failure ([place])   . GERD (gastroesophageal reflux disease)   . Heart disease   . History of congenital mitral regurgitation    mild  . History of mitral valve prolapse 06/15/2008   a. echo 1/14: mild LVH, EF 60-65%, mild MR, mild to mod BAE, PASP 35  . Hypercholesterolemia   . Hypertension   . Hypothyroidism     Past Surgical History:  Procedure Laterality Date  . ABDOMINAL HYSTERECTOMY    . CATARACT EXTRACTION    . CHOLECYSTECTOMY    . Thyroidectomy       Current Outpatient Prescriptions  Medication Sig Dispense Refill  . beta carotene w/minerals (OCUVITE) tablet Take 1 tablet by mouth daily.    . calcium carbonate (OS-CAL - DOSED IN MG OF ELEMENTAL CALCIUM) 1250 MG tablet Take 1 tablet by mouth 2 (two) times daily.     . Cholecalciferol (VITAMIN D) 2000 UNITS tablet Take 2,000  Units by mouth daily.     . clobetasol ointment (TEMOVATE) [id number] % Apply 1 application topically daily as needed (when skin cancer is removed).   0  . estradiol (ESTRACE VAGINAL) 0.1 MG/GM vaginal cream Place 2 g vaginally once a week.     . furosemide (LASIX) 20 MG tablet Take 2 tablets (40 mg total) by mouth 2 (two) times daily. 2 tablets by mouth twice a day 360 tablet 0  . isosorbide mononitrate (IMDUR) 30 MG 24 hr tablet Take 3 tablets (90 mg total) by mouth daily. 270 tablet 3  . latanoprost (XALATAN) 0.005 % ophthalmic solution Place 1 drop into the left eye at bedtime.     Marland Kitchen levothyroxine (SYNTHROID, LEVOTHROID) 100 MCG tablet Take 100 mcg by mouth every other day. Alternating days with  Levothyroxine sodium 12mcg.    . levothyroxine (SYNTHROID, LEVOTHROID) 88 MCG tablet Take 88 mcg by mouth every other day.  0  . Magnesium 500 MG TABS Take 1,000 mg by mouth at bedtime.     . metoprolol succinate (TOPROL XL) 25 MG 24 hr tablet Take 1 tablet (25 mg total) by mouth daily. 90 tablet 1  . nitroGLYCERIN (NITROSTAT) 0.4 MG SL tablet DISSOLVE 1 TABLET UNDER THE TONGUE EVERY 5 MINUTES FOR 3 DOSES AS NEEDED  FOR  CHEST  PAIN 50 tablet 2  . potassium chloride (K-DUR,KLOR-CON) 10 MEQ tablet TAKE 2 TABLETS (20 MEQ TOTAL) BY MOUTH DAILY. 180 tablet 2  . pramipexole (MIRAPEX) 0.125 MG tablet Take 1 tablet (0.125 mg total) by mouth every evening. 90 tablet 3  . RESTASIS 0.05 % ophthalmic emulsion Place 1 drop into both eyes 2 (two) times daily.     . temazepam (RESTORIL) 15 MG capsule Take 15 mg by mouth at bedtime.    . vitamin C (ASCORBIC ACID) 500 MG tablet Take 500 mg by mouth daily.    Marland Kitchen warfarin (COUMADIN) 5 MG tablet Take 1 tablet by mouth daily or as directed by coumadin clinic 90 tablet 1   No current facility-administered medications for this visit.     Allergies:   Azithromycin; Compazine [prochlorperazine edisylate]; Demerol; Dolophine [methadone]; Ebastine; Erythromycin; Ibuprofen; Statins; Tetanus toxoids; and Tetracyclines & related    Social History:  The patient  reports that she has never smoked. She has never used smokeless tobacco. She reports that she does not drink alcohol or use drugs.   Family History:  The patient's family history includes Stroke in her father and mother.    ROS:  Please see the history of present illness.   Otherwise, review of systems are positive for none.   All other systems are reviewed and negative.    PHYSICAL EXAM: VS:  BP 139/81   Pulse 72   Ht 5' (1.524 m)   Wt 138 lb 6.4 oz (62.8 kg)   BMI 27.03 kg/m  , BMI Body mass index is 27.03 kg/m. GENERAL:  Well appearing HEENT:  Pupils equal round and reactive, fundi not visualized,  oral mucosa unremarkable NECK:  No jugular venous distention, waveform within normal limits, carotid upstroke brisk and symmetric, no bruits, no thyromegaly LYMPHATICS:  No cervical adenopathy LUNGS:  Clear to auscultation bilaterally HEART: Irregularly irregular  PMI not displaced or sustained,S1 and S2 within normal limits, no S3, no S4, no clicks, no rubs, II/VI holosystolic murmur at the apex. ABD:  Flat, positive bowel sounds normal in frequency in pitch, no bruits, no rebound, no guarding, no midline pulsatile mass, no hepatomegaly,  no splenomegaly EXT:  2 plus pulses throughout, no edema, no cyanosis no clubbing SKIN:  No rashes no nodules NEURO:  Cranial nerves II through XII grossly intact, motor grossly intact throughout PSYCH:  Cognitively intact, oriented to person place and time   EKG:  EKG is ordered today. 05/06/16: Atrial fibrillation rate 72 bpm  Echo 11/11/14: Study Conclusions  - Left ventricle: The cavity size was normal. Wall thickness was normal. Systolic function was normal. The estimated ejection fraction was in the range of 60% to 65%. - Aortic valve: There was mild regurgitation. - Mitral valve: Calcified annulus. Mildly thickened leaflets . There was mild regurgitation. - Left atrium: The atrium was mildly dilated. - Right atrium: The atrium was mildly dilated.  Recent Labs: 08/22/2015: BUN 15; Creat 0.72; Hemoglobin 14.4; Platelets 234; Potassium 4.2; Sodium 138    Lipid Panel    Component Value Date/Time   CHOL 211 (H) 11/04/2012 1524   TRIG 201.0 (H) 11/04/2012 1524   HDL 66.80 11/04/2012 1524   CHOLHDL 3 11/04/2012 1524   VLDL 40.2 (H) 11/04/2012 1524   LDLCALC 109 (H) 04/16/2012 1043   LDLDIRECT 98.8 11/04/2012 1524   03/28/16: Sodium 136, potassium 4.1, BUN 17, creatinine 0.74 AST 24, ALT 17 WBC 10, hemoglobin 15.7, hematocrit 46.8, platelets 291  07/21/15: TSH 1.52, free T4 1 0.25  04/13/15: Total cholesterol 235, triglycerides 150,  HDL 66, LDL 139  03/08/15: BNP 601  Wt Readings from Last 3 Encounters:  05/06/16 138 lb 6.4 oz (62.8 kg)  01/23/16 140 lb (63.5 kg)  10/30/15 146 lb (66.2 kg)      ASSESSMENT AND PLAN:  # Chest pain: Resolved.  She is doing better on Imdur 90mg  daily.  She isn't exerting herself much anymore.  She remains uninterested in stress testing.  There was no prior MI on her nuclear stress in 2013 and her symptoms have not changed significantly.  No aspirin given that she is on warfarin.  Atenolol is on back order, so we will switch to metoprolol succinate 25 mg daily.   # Chronic atrial fibrillation: Rate well-controlled. She is asymptomatic.  Continue warfarin (adjusted today due to INR 1.4) and metoprolol as above. This patients CHA2DS2-VASc Score and unadjusted Ischemic Stroke Rate (% per year) is equal to 4.8 % stroke rate/year from a score of 4  Above score calculated as 1 point each if present [CHF, HTN, DM, Vascular=MI/PAD/Aortic Plaque, Age if 65-74, or Female] Above score calculated as 2 points each if present [Age > 75, or Stroke/TIA/TE]   # Chronic diastolic heart failure: Ms. Bendon is euvolemic on exam.  She continues to avoid salt and weighs herself daily.  We discussed the fact that she can take an extra dose of Lasix if her weight goes up by 2 pounds or if she has abdominal fullness.  # Hypertension: BP is well-controlled. Atenolol is currently on back-order. Therefore, we will switch atenolol to metoprolol succinate 25 mg daily. She will check her blood pressure and call if it is greater than 150/90.    Current medicines are reviewed at length with the patient today.  The patient does not have concerns regarding medicines.  The following changes have been made:  Increase Imdur to 90 mg daily.  Labs/ tests ordered today include:   Orders Placed This Encounter  Procedures  . EKG 12-Lead     Disposition:   FU with Abriel Geesey C. Oval Linsey, MD, [facility] in 6 months.    This  note  was written with the assistance of speech recognition software.  Please excuse any transcriptional errors.  Signed, Lillianne Eick C. Oval Linsey, MD, [facility]  05/06/2016 1:30 PM    [place]

## 2016-05-06 ENCOUNTER — Encounter: Payer: Self-pay | Admitting: Cardiovascular Disease

## 2016-05-06 ENCOUNTER — Ambulatory Visit (INDEPENDENT_AMBULATORY_CARE_PROVIDER_SITE_OTHER): Payer: Commercial Managed Care - HMO | Admitting: Pharmacist Clinician (PhC)/ Clinical Pharmacy Specialist

## 2016-05-06 ENCOUNTER — Ambulatory Visit (INDEPENDENT_AMBULATORY_CARE_PROVIDER_SITE_OTHER): Payer: Commercial Managed Care - HMO | Admitting: Cardiovascular Disease

## 2016-05-06 VITALS — BP 139/81 | HR 72 | Ht 60.0 in | Wt 138.4 lb

## 2016-05-06 DIAGNOSIS — Z5181 Encounter for therapeutic drug level monitoring: Secondary | ICD-10-CM | POA: Diagnosis not present

## 2016-05-06 DIAGNOSIS — I10 Essential (primary) hypertension: Secondary | ICD-10-CM | POA: Diagnosis not present

## 2016-05-06 DIAGNOSIS — I482 Chronic atrial fibrillation, unspecified: Secondary | ICD-10-CM

## 2016-05-06 DIAGNOSIS — I5032 Chronic diastolic (congestive) heart failure: Secondary | ICD-10-CM

## 2016-05-06 LAB — POCT INR: INR: 1.9

## 2016-05-06 MED ORDER — METOPROLOL SUCCINATE ER 25 MG PO TB24: 25.0000 mg | ORAL_TABLET | Freq: Every day | ORAL | 1 refills | Status: DC

## 2016-05-06 NOTE — Patient Instructions (Addendum)
Medication Instructions:  START METOPROLOL SUCC 25 MG DAILY WHILE YOUR ATENOLOL IS ON BACK ORDER  Labwork: NONE  Testing/Procedures: NONE  Follow-Up: Your physician wants you to follow-up in: [place] will receive a reminder letter in the mail two months in advance. If you don't receive a letter, please call our office to schedule the follow-up appointment.  If you need a refill on your cardiac medications before your next appointment, please call your pharmacy.

## 2016-05-07 DIAGNOSIS — S20479A Other superficial bite of unspecified back wall of thorax, initial encounter: Secondary | ICD-10-CM | POA: Diagnosis not present

## 2016-05-07 DIAGNOSIS — W57XXXA Bitten or stung by nonvenomous insect and other nonvenomous arthropods, initial encounter: Secondary | ICD-10-CM | POA: Diagnosis not present

## 2016-05-09 ENCOUNTER — Other Ambulatory Visit: Payer: Self-pay | Admitting: Cardiovascular Disease

## 2016-05-10 DIAGNOSIS — M810 Age-related osteoporosis without current pathological fracture: Secondary | ICD-10-CM | POA: Diagnosis not present

## 2016-05-10 DIAGNOSIS — I5030 Unspecified diastolic (congestive) heart failure: Secondary | ICD-10-CM | POA: Diagnosis not present

## 2016-05-10 DIAGNOSIS — M899 Disorder of bone, unspecified: Secondary | ICD-10-CM | POA: Diagnosis not present

## 2016-05-10 DIAGNOSIS — Z85850 Personal history of malignant neoplasm of thyroid: Secondary | ICD-10-CM | POA: Diagnosis not present

## 2016-05-10 DIAGNOSIS — G2581 Restless legs syndrome: Secondary | ICD-10-CM | POA: Diagnosis not present

## 2016-05-10 DIAGNOSIS — I10 Essential (primary) hypertension: Secondary | ICD-10-CM | POA: Diagnosis not present

## 2016-05-10 DIAGNOSIS — H3530 Unspecified macular degeneration: Secondary | ICD-10-CM | POA: Diagnosis not present

## 2016-05-10 DIAGNOSIS — I4891 Unspecified atrial fibrillation: Secondary | ICD-10-CM | POA: Diagnosis not present

## 2016-05-10 DIAGNOSIS — Z0000 Encounter for general adult medical examination without abnormal findings: Secondary | ICD-10-CM | POA: Diagnosis not present

## 2016-05-10 DIAGNOSIS — E890 Postprocedural hypothyroidism: Secondary | ICD-10-CM | POA: Diagnosis not present

## 2016-05-13 NOTE — Telephone Encounter (Signed)
Review for refill. 

## 2016-05-13 NOTE — Telephone Encounter (Signed)
Please review for refill. Thanks!  

## 2016-05-13 NOTE — Telephone Encounter (Signed)
This is Dr. Oval Linsey pt. Please Arnoldo Hooker

## 2016-05-15 ENCOUNTER — Ambulatory Visit (INDEPENDENT_AMBULATORY_CARE_PROVIDER_SITE_OTHER): Payer: Commercial Managed Care - HMO | Admitting: Ophthalmology

## 2016-05-20 ENCOUNTER — Ambulatory Visit (INDEPENDENT_AMBULATORY_CARE_PROVIDER_SITE_OTHER): Payer: Commercial Managed Care - HMO | Admitting: Pharmacist Clinician (PhC)/ Clinical Pharmacy Specialist

## 2016-05-20 DIAGNOSIS — J069 Acute upper respiratory infection, unspecified: Secondary | ICD-10-CM | POA: Diagnosis not present

## 2016-05-20 DIAGNOSIS — I482 Chronic atrial fibrillation, unspecified: Secondary | ICD-10-CM

## 2016-05-20 DIAGNOSIS — Z5181 Encounter for therapeutic drug level monitoring: Secondary | ICD-10-CM

## 2016-05-20 LAB — POCT INR: INR: 2.5

## 2016-05-27 DIAGNOSIS — N8182 Incompetence or weakening of pubocervical tissue: Secondary | ICD-10-CM | POA: Diagnosis not present

## 2016-05-29 ENCOUNTER — Ambulatory Visit (INDEPENDENT_AMBULATORY_CARE_PROVIDER_SITE_OTHER): Payer: Commercial Managed Care - HMO | Admitting: Ophthalmology

## 2016-05-29 DIAGNOSIS — I10 Essential (primary) hypertension: Secondary | ICD-10-CM

## 2016-05-29 DIAGNOSIS — H353132 Nonexudative age-related macular degeneration, bilateral, intermediate dry stage: Secondary | ICD-10-CM

## 2016-05-29 DIAGNOSIS — H35033 Hypertensive retinopathy, bilateral: Secondary | ICD-10-CM

## 2016-05-29 DIAGNOSIS — H35342 Macular cyst, hole, or pseudohole, left eye: Secondary | ICD-10-CM | POA: Diagnosis not present

## 2016-05-29 DIAGNOSIS — H43813 Vitreous degeneration, bilateral: Secondary | ICD-10-CM

## 2016-05-30 DIAGNOSIS — T7840XA Allergy, unspecified, initial encounter: Secondary | ICD-10-CM | POA: Diagnosis not present

## 2016-05-30 DIAGNOSIS — Z7901 Long term (current) use of anticoagulants: Secondary | ICD-10-CM | POA: Diagnosis not present

## 2016-06-05 DIAGNOSIS — L308 Other specified dermatitis: Secondary | ICD-10-CM | POA: Diagnosis not present

## 2016-06-05 DIAGNOSIS — D2272 Melanocytic nevi of left lower limb, including hip: Secondary | ICD-10-CM | POA: Diagnosis not present

## 2016-06-05 DIAGNOSIS — Z85828 Personal history of other malignant neoplasm of skin: Secondary | ICD-10-CM | POA: Diagnosis not present

## 2016-06-05 DIAGNOSIS — L814 Other melanin hyperpigmentation: Secondary | ICD-10-CM | POA: Diagnosis not present

## 2016-06-05 DIAGNOSIS — D1801 Hemangioma of skin and subcutaneous tissue: Secondary | ICD-10-CM | POA: Diagnosis not present

## 2016-06-05 DIAGNOSIS — L603 Nail dystrophy: Secondary | ICD-10-CM | POA: Diagnosis not present

## 2016-06-17 ENCOUNTER — Ambulatory Visit (INDEPENDENT_AMBULATORY_CARE_PROVIDER_SITE_OTHER): Payer: Commercial Managed Care - HMO | Admitting: Pharmacist Clinician (PhC)/ Clinical Pharmacy Specialist

## 2016-06-17 DIAGNOSIS — Z5181 Encounter for therapeutic drug level monitoring: Secondary | ICD-10-CM

## 2016-06-17 DIAGNOSIS — I482 Chronic atrial fibrillation, unspecified: Secondary | ICD-10-CM

## 2016-06-17 LAB — POCT INR: INR: 3.1

## 2016-06-24 DIAGNOSIS — Z01419 Encounter for gynecological examination (general) (routine) without abnormal findings: Secondary | ICD-10-CM | POA: Diagnosis not present

## 2016-06-24 DIAGNOSIS — N8189 Other female genital prolapse: Secondary | ICD-10-CM | POA: Diagnosis not present

## 2016-06-24 DIAGNOSIS — Z6827 Body mass index (BMI) 27.0-27.9, adult: Secondary | ICD-10-CM | POA: Diagnosis not present

## 2016-07-04 DIAGNOSIS — H01003 Unspecified blepharitis right eye, unspecified eyelid: Secondary | ICD-10-CM | POA: Diagnosis not present

## 2016-07-04 DIAGNOSIS — H04123 Dry eye syndrome of bilateral lacrimal glands: Secondary | ICD-10-CM | POA: Diagnosis not present

## 2016-07-04 DIAGNOSIS — H1852 Epithelial (juvenile) corneal dystrophy: Secondary | ICD-10-CM | POA: Diagnosis not present

## 2016-07-16 ENCOUNTER — Ambulatory Visit (INDEPENDENT_AMBULATORY_CARE_PROVIDER_SITE_OTHER): Payer: Commercial Managed Care - HMO | Admitting: Pharmacist Clinician (PhC)/ Clinical Pharmacy Specialist

## 2016-07-16 DIAGNOSIS — Z5181 Encounter for therapeutic drug level monitoring: Secondary | ICD-10-CM

## 2016-07-16 DIAGNOSIS — I482 Chronic atrial fibrillation, unspecified: Secondary | ICD-10-CM

## 2016-07-16 LAB — POCT INR: INR: 3

## 2016-07-24 ENCOUNTER — Encounter: Payer: Self-pay | Admitting: Neurology

## 2016-07-24 ENCOUNTER — Ambulatory Visit (INDEPENDENT_AMBULATORY_CARE_PROVIDER_SITE_OTHER): Payer: Commercial Managed Care - HMO | Admitting: Neurology

## 2016-07-24 VITALS — BP 149/79 | HR 68 | Resp 20 | Ht 60.0 in | Wt 138.0 lb

## 2016-07-24 DIAGNOSIS — G2581 Restless legs syndrome: Secondary | ICD-10-CM

## 2016-07-24 DIAGNOSIS — M62838 Other muscle spasm: Secondary | ICD-10-CM | POA: Diagnosis not present

## 2016-07-24 MED ORDER — PRAMIPEXOLE DIHYDROCHLORIDE 0.125 MG PO TABS: 0.1250 mg | ORAL_TABLET | Freq: Every evening | ORAL | 3 refills | Status: DC

## 2016-07-24 NOTE — Patient Instructions (Signed)
Try drinking some gatorade and try tonic water, which may help with muscle spasms and cramping. You may not be hydrating well enough. Please try to eat more vegetables.

## 2016-07-24 NOTE — Progress Notes (Signed)
Subjective:    Patient ID: Kristen Whitehead is a 82 y.o. female.  HPI     Interim history:   Kristen Whitehead is a very pleasant 82 year old right-handed woman with an underlying medical history of hypertension, hyperlipidemia, atrial fibrillation, celiac disease, scoliosis, reflux disease, who presents for followup consultation of her bilateral foot pain, likely RLS. She is accompanied by her friend Kristen Whitehead today. I last saw her on 01/23/2016, at which time she reported doing well. She had no recent pain in her hands or feet and no cramping was reported, no paresthesias. She had a recent cardiology visit in March 2017. She reported some short-term memory issues and word finding issues. She was quite active physically and mentally. I suggested we monitor her memory complaints. She was advised to continue with low-dose Mirapex.  Today, 07/24/2016: She reports more Cramping in her legs, particularly on the right side and also spasms that seem to start in the distance and go up the muscles, she has to walk around extensively at times in the evening to relieve the muscle spasms. She does admit that she does not drink enough water, often does not drink at all when she has an appointment or as to be outside. For example, today, she only had one glass of water. She also does not always have breakfast in the morning. She does not like to cook for herself. She eats what is available. She has not been losing any weight. She does not always eat enough vegetables.   Previously:  I saw her on 07/24/2015, at which time she reported feeling stable. She felt that there was tingling in her legs and she had a burning sensation in her feet. She did feel that Mirapex was helpful. She had some intermittent tingling in her fingers. She also reported feeling more forgetful. I suggested we continue with low dose pramipexole 0.125 mg each night.   I saw her on 01/20/2015 at which time she reported feeling stable for the most  part. Her burning sensation seemed about the same and her leg cramping seemed a little less overall. She was tolerating low-dose Mirapex. She saw her cardiologist 2 days prior. She had presented to the emergency room on 11/10/2014 with chest pain and dizziness. She was admitted for workup. Cardiac workup was negative. She has A. Fib, rate controlled. She has stable lower extremity swelling and was reminded by her cardiologist to use compression stockings. She was not drinking enough water. I asked her to continue low-dose Mirapex each night.   I saw her on 07/21/2014, at which time she reported a fall about 3 months prior. She had tripped over something. She had no significant injuries and no sequelae. She was not drinking enough water. Her leg swelling was stable. I suggested she continue with low-dose Mirapex, 0.125 mg once nightly.   I saw her on 12/27/2013, at which time she noted ankle swelling. She had gone into cardiac failure the year before and felt similar to that. Nevertheless, because of her lower extremity swelling I suggested she cut back on Mirapex to 0.125 mg each night and see if the swelling improves. If she had flareup in her restless leg symptoms I suggested she could go back to 0.25 mg nightly.     I saw her on 06/28/2013, at which time I noted on clinical exam that she had mild evidence of neuropathy in the absence of a history of diabetes with normal hemoglobin A1c noted and normal B12 levels. She had  EMG and nerve conduction studies that did not show any significant neuropathy. I felt that she had most likely RLS. She had done well with Mirapex. I did not make any changes to her medication regimen and asked her to continue taking Mirapex 0.25 mg each night. She did not have any side effects at the time.   I first met her on 02/17/2013, at which time I suggested EMG and nerve conduction testing and a treatment trial with Mirapex because of possible underlying RLS. She has previously  seen a podiatrist who had done a nerve biopsy which she reported was normal. She has a history of A. fib. She has a history of congestive heart failure and is followed by cardiology. Echocardiogram in January 2014 showed biatrial enlargement and mild mitral regurgitation and an EF of 78-24% with diastolic dysfunction.   For years she has had burning in her feet and started having a crawling sensation in her distal legs in 2014. She has also had cramping in her muscles for years and she was taken off of her statin in 2013 and her cramping eventually became better. She has had pins and needles sensation and it helps to walk around. She has worse symptoms at night or by the end of the day. She remembers having "terrible growing pains" as a child and does not have a FHx of RLS as far as she knows. She had nerve biopsies both legs by her podiatrist, which did not show inflammation. While she has a history of chronic back pain she denied any radiating back pain to her legs. In fact she described pins and needle and crawling sensation in the distal lower extremities, radiating upwards. She has been taking OTC Mg supplements, which helped her cramps.     Her EMG and nerve conduction testing from 03/03/2013 showed no evidence of peripheral neuropathy. We called her with her test results. Her foot pain and paresthesias improved.  Her Past Medical History Is Significant For: Past Medical History:  Diagnosis Date  . Bladder prolapse, female, acquired   . Cancer ([place])    thyroid  . Chest pain    no known ischemic heart disease; negative Myoview July 2013. EF 75% with no ischemia.   . Chronic anticoagulation   . Chronic atrial fibrillation (HCC)    managed with rate control and coumadin  . Chronic diastolic heart failure ([place])   . GERD (gastroesophageal reflux disease)   . Heart disease   . History of congenital mitral regurgitation    mild  . History of mitral valve prolapse 06/15/2008   a. echo 1/14: mild  LVH, EF 60-65%, mild MR, mild to mod BAE, PASP 35  . Hypercholesterolemia   . Hypertension   . Hypothyroidism     Her Past Surgical History Is Significant For: Past Surgical History:  Procedure Laterality Date  . ABDOMINAL HYSTERECTOMY    . CATARACT EXTRACTION    . CHOLECYSTECTOMY    . Thyroidectomy      Her Family History Is Significant For: Family History  Problem Relation Age of Onset  . Heart attack Neg Hx   . Heart disease Neg Hx   . Stroke Mother   . Stroke Father     Her Social History Is Significant For: Social History   Social History  . Marital status: Widowed    Spouse name: N/A  . Number of children: 3  . Years of education: BA   Occupational History  .  Retired   Science writer  History Main Topics  . Smoking status: Never Smoker  . Smokeless tobacco: Never Used  . Alcohol use No  . Drug use: No  . Sexual activity: No   Other Topics Concern  . None   Social History Narrative   Pt lives at home alone.   Caffeine Use: quit 56yr ago    Her Allergies Are:  Allergies  Allergen Reactions  . Azithromycin Nausea Only  . Compazine [Prochlorperazine Edisylate] Nausea Only  . Demerol Nausea Only  . Dolophine [Methadone] Nausea Only  . Ebastine Nausea Only    EBS  . Erythromycin Nausea Only  . Ibuprofen Nausea Only  . Statins Other (See Comments)    myalgias  . Tetanus Toxoids Nausea Only  . Tetracyclines & Related Nausea Only  :   Her Current Medications Are:  Outpatient Encounter Prescriptions as of 07/24/2016  Medication Sig  . beta carotene w/minerals (OCUVITE) tablet Take 1 tablet by mouth daily.  . calcium carbonate (OS-CAL - DOSED IN MG OF ELEMENTAL CALCIUM) 1250 MG tablet Take 1 tablet by mouth 2 (two) times daily.   . Cholecalciferol (VITAMIN D) 2000 UNITS tablet Take 2,000 Units by mouth daily.   .Marland Kitchenestradiol (ESTRACE VAGINAL) 0.1 MG/GM vaginal cream Place 2 g vaginally once a week.   . furosemide (LASIX) 20 MG tablet TAKE 2 TABLETS (40 MG  TOTAL) TWO TIMES DAILY  . isosorbide mononitrate (IMDUR) 30 MG 24 hr tablet Take 3 tablets (90 mg total) by mouth daily.  .Marland Kitchenlatanoprost (XALATAN) 0.005 % ophthalmic solution Place 1 drop into the left eye at bedtime.   .Marland Kitchenlevothyroxine (SYNTHROID, LEVOTHROID) 100 MCG tablet Take 100 mcg by mouth every other day. Alternating days with Levothyroxine sodium 854m.  . levothyroxine (SYNTHROID, LEVOTHROID) 88 MCG tablet Take 88 mcg by mouth every other day.  . Magnesium 500 MG TABS Take 1,000 mg by mouth at bedtime.   . nitroGLYCERIN (NITROSTAT) 0.4 MG SL tablet DISSOLVE 1 TABLET UNDER THE TONGUE EVERY 5 MINUTES FOR 3 DOSES AS NEEDED  FOR  CHEST  PAIN  . potassium chloride (K-DUR,KLOR-CON) 10 MEQ tablet TAKE 2 TABLETS (20 MEQ TOTAL) BY MOUTH DAILY.  . Marland Kitchenramipexole (MIRAPEX) 0.125 MG tablet Take 1 tablet (0.125 mg total) by mouth every evening.  . temazepam (RESTORIL) 15 MG capsule Take 15 mg by mouth at bedtime.  . vitamin C (ASCORBIC ACID) 500 MG tablet Take 500 mg by mouth daily.  . Marland Kitchenarfarin (COUMADIN) 5 MG tablet Take 1 tablet by mouth daily or as directed by coumadin clinic  . [DISCONTINUED] clobetasol ointment (TEMOVATE) 0.1.51 Apply 1 application topically daily as needed (when skin cancer is removed).   . [DISCONTINUED] metoprolol succinate (TOPROL XL) 25 MG 24 hr tablet Take 1 tablet (25 mg total) by mouth daily.  . [DISCONTINUED] RESTASIS 0.05 % ophthalmic emulsion Place 1 drop into both eyes 2 (two) times daily.    No facility-administered encounter medications on file as of 07/24/2016.   :  Review of Systems:  Out of a complete 14 point review of systems, all are reviewed and negative with the exception of these symptoms as listed below:  Review of Systems  Neurological:       Pt presents today to discuss her restless legs, and what she describes as "muscle spasms" during the night. Pt is taking mirapex at night with no problems at this time. Pt is concerned about the muscle spasms that  she is experiencing and wants to know what could  be causing them.    Objective:  Neurologic Exam  Physical Exam Physical Examination:   Vitals:   07/24/16 1201  BP: (!) 149/79  Pulse: 68  Resp: 20   General Examination: The patient is a very pleasant 82 y.o. female in no acute distress. She appears well-developed and well-nourished and well groomed. She is in good spirits today.   HEENT: Normocephalic, atraumatic, pupils are equal, round and reactive to light and accommodation. She is s/p bilateral cataract repairs. Glasses in place. Extraocular tracking is good without limitation to gaze excursion or nystagmus noted. Normal smooth pursuit is noted. Hearing is grossly intact. Face is symmetric with normal facial animation and normal facial sensation. Speech is clear with no dysarthria noted. There is no hypophonia. There is no lip, neck/head, jaw or voice tremor. Neck is supple with full range of passive and active motion. There are no carotid bruits on auscultation. Oropharynx exam reveals: mild mouth dryness, adequate dental hygiene and no airway crowding. Tongue protrudes centrally and palate elevates symmetrically.    Chest: Clear to auscultation without wheezing, rhonchi or crackles noted.  Heart: S1+S2+0, irregularly irregular, no murmur noted.    Abdomen: Soft, non-tender and non-distended with normal bowel sounds appreciated on auscultation.  Extremities: There is no edema. Pedal pulses are intact.  Skin: Warm and dry without trophic changes noted. There are no varicose veins.  Musculoskeletal: exam reveals no obvious joint deformities, tenderness or joint swelling or erythema.   Neurologically:  Mental status: The patient is awake, alert and oriented in all 4 spheres. Her memory, attention, language and knowledge are appropriate. There is no aphasia, agnosia, apraxia or anomia. Speech is clear with normal prosody and enunciation. Thought process is linear. Mood is congruent  and affect is normal.  Cranial nerves are as described above under HEENT exam. In addition, shoulder shrug is normal with equal shoulder height noted. Motor exam: Normal bulk, strength and tone is noted. There is no drift, tremor or rebound. Romberg is negative. Reflexes are 1+ in the UEs and trace in the knees and absent in both ankles. Fine motor skills are intact.  Cerebellar testing shows no dysmetria or intention tremor on finger to nose testing. There is no truncal or gait ataxia.  Sensory exam is stable, mildly decreased to all modalities in the distal LEs.  Gait, station and balance: she stands with no significant difficulty. No veering to one side is noted. No leaning to one side is noted. Posture is mildly stooped, likely age-appropriate and stance is slightly wide-based. She walks cautiously. She does not use any assistive device.   Assessment and Plan:   In summary, Kristen Whitehead is a very pleasant 82 year old female with an underlying medical history of hypertension, hyperlipidemia, atrial fibrillation, celiac disease, scoliosis, reflux disease, whoPresents for follow-up consultation of her foot pain. She reports more cramping and muscle spasms in her R leg, but has not been hydrating very well. She often skips breakfast. She does not always eat enough fresh vegetables and fruit. She likes fruit but vegetables she does not always have available and does not like to cook for herself. We mutually agreed to continue with Mirapex low-dose generic 0.125 mg each evening. I did ask her to consider drinking some Gatorade and also some tonic water to see if her cramping and spasms improve. I would avoid any additional medications for fear of side effects. In addition, I strongly encouraged her to be better hydrated overall and also try to  add some more vegetables to her day-to-day diet. She is advised not to skip any breakfast. Physical exam is otherwise stable. I would like to see her back in 6  months, sooner as needed. I answered all their questions today and the patient and her friend were in agreement.  I spent 20 minutes in total face-to-face time with the patient, more than 50% of which was spent in counseling and coordination of care, reviewing test results, reviewing medication and discussing or reviewing the diagnosis of PN, RLS, the prognosis and treatment options.

## 2016-07-26 DIAGNOSIS — H04123 Dry eye syndrome of bilateral lacrimal glands: Secondary | ICD-10-CM | POA: Diagnosis not present

## 2016-07-26 DIAGNOSIS — H1852 Epithelial (juvenile) corneal dystrophy: Secondary | ICD-10-CM | POA: Diagnosis not present

## 2016-07-26 DIAGNOSIS — H16223 Keratoconjunctivitis sicca, not specified as Sjogren's, bilateral: Secondary | ICD-10-CM | POA: Diagnosis not present

## 2016-07-29 DIAGNOSIS — N8182 Incompetence or weakening of pubocervical tissue: Secondary | ICD-10-CM | POA: Diagnosis not present

## 2016-08-09 ENCOUNTER — Other Ambulatory Visit: Payer: Self-pay | Admitting: Cardiovascular Disease

## 2016-08-13 ENCOUNTER — Telehealth: Payer: Self-pay

## 2016-08-13 ENCOUNTER — Other Ambulatory Visit: Payer: Self-pay

## 2016-08-13 ENCOUNTER — Other Ambulatory Visit: Payer: Self-pay | Admitting: Cardiovascular Disease

## 2016-08-13 MED ORDER — ATENOLOL 25 MG PO TABS: 25.0000 mg | ORAL_TABLET | Freq: Every day | ORAL | 3 refills | Status: DC

## 2016-08-13 MED ORDER — ATENOLOL 25 MG PO TABS: 25.0000 mg | ORAL_TABLET | Freq: Every day | ORAL | 0 refills | Status: DC

## 2016-08-13 MED ORDER — POTASSIUM CHLORIDE CRYS ER 10 MEQ PO TBCR: EXTENDED_RELEASE_TABLET | ORAL | 2 refills | Status: DC

## 2016-08-13 NOTE — Telephone Encounter (Signed)
error 

## 2016-08-14 ENCOUNTER — Ambulatory Visit (INDEPENDENT_AMBULATORY_CARE_PROVIDER_SITE_OTHER): Payer: Commercial Managed Care - HMO | Admitting: Pharmacist

## 2016-08-14 DIAGNOSIS — I482 Chronic atrial fibrillation, unspecified: Secondary | ICD-10-CM

## 2016-08-14 DIAGNOSIS — Z5181 Encounter for therapeutic drug level monitoring: Secondary | ICD-10-CM

## 2016-08-14 LAB — POCT INR: INR: 3.5

## 2016-08-28 DIAGNOSIS — N8182 Incompetence or weakening of pubocervical tissue: Secondary | ICD-10-CM | POA: Diagnosis not present

## 2016-09-24 DIAGNOSIS — N8182 Incompetence or weakening of pubocervical tissue: Secondary | ICD-10-CM | POA: Diagnosis not present

## 2016-09-27 ENCOUNTER — Ambulatory Visit (INDEPENDENT_AMBULATORY_CARE_PROVIDER_SITE_OTHER): Payer: Commercial Managed Care - HMO | Admitting: Pharmacist

## 2016-09-27 DIAGNOSIS — Z5181 Encounter for therapeutic drug level monitoring: Secondary | ICD-10-CM | POA: Diagnosis not present

## 2016-09-27 DIAGNOSIS — I482 Chronic atrial fibrillation, unspecified: Secondary | ICD-10-CM

## 2016-09-27 LAB — POCT INR: INR: 1.8

## 2016-10-09 ENCOUNTER — Telehealth: Payer: Self-pay | Admitting: Cardiovascular Disease

## 2016-10-09 NOTE — Telephone Encounter (Signed)
New message     Pt c/o swelling: STAT is pt has developed SOB within 24 hours  1. How long have you been experiencing swelling? Since yesterday  2. Where is the swelling located? chest  3.  Are you currently taking a "fluid pill"? yes  4.  Are you currently SOB? no  5.  Have you traveled recently? no   She has gained 3lbs since yesterday , she is also having a reaction to gabapentin 100mg 

## 2016-10-09 NOTE — Telephone Encounter (Signed)
RN Spoke to patient.  Patient states she has gained 3 lbs overnight, that is unusual for her. Patient states she has gained up to 2 lbs over night before.  Presently taking 2 tablet of furosemide twice a day.  Patient states she had received verbal to take an extra dose of lasix with evening dose.  RN instructed patient to take an extra dose this evening. Per protocol   patient states she will be seeing her primary Dr Kristen Whitehead tomorrow- feeling weak since she started gabapentin @3  weeks ago.

## 2016-10-10 DIAGNOSIS — M79673 Pain in unspecified foot: Secondary | ICD-10-CM | POA: Diagnosis not present

## 2016-10-10 DIAGNOSIS — R252 Cramp and spasm: Secondary | ICD-10-CM | POA: Diagnosis not present

## 2016-10-10 DIAGNOSIS — G2581 Restless legs syndrome: Secondary | ICD-10-CM | POA: Diagnosis not present

## 2016-10-10 DIAGNOSIS — Z79899 Other long term (current) drug therapy: Secondary | ICD-10-CM | POA: Diagnosis not present

## 2016-10-11 DIAGNOSIS — M21961 Unspecified acquired deformity of right lower leg: Secondary | ICD-10-CM | POA: Diagnosis not present

## 2016-10-11 DIAGNOSIS — D2371 Other benign neoplasm of skin of right lower limb, including hip: Secondary | ICD-10-CM | POA: Diagnosis not present

## 2016-10-11 DIAGNOSIS — G609 Hereditary and idiopathic neuropathy, unspecified: Secondary | ICD-10-CM | POA: Diagnosis not present

## 2016-10-12 DIAGNOSIS — J209 Acute bronchitis, unspecified: Secondary | ICD-10-CM | POA: Diagnosis not present

## 2016-10-12 DIAGNOSIS — R0602 Shortness of breath: Secondary | ICD-10-CM | POA: Diagnosis not present

## 2016-10-17 DIAGNOSIS — Z7901 Long term (current) use of anticoagulants: Secondary | ICD-10-CM | POA: Diagnosis not present

## 2016-10-17 DIAGNOSIS — R05 Cough: Secondary | ICD-10-CM | POA: Diagnosis not present

## 2016-10-17 DIAGNOSIS — I5030 Unspecified diastolic (congestive) heart failure: Secondary | ICD-10-CM | POA: Diagnosis not present

## 2016-10-17 DIAGNOSIS — I4891 Unspecified atrial fibrillation: Secondary | ICD-10-CM | POA: Diagnosis not present

## 2016-10-17 DIAGNOSIS — R079 Chest pain, unspecified: Secondary | ICD-10-CM | POA: Diagnosis not present

## 2016-10-25 DIAGNOSIS — M79671 Pain in right foot: Secondary | ICD-10-CM | POA: Diagnosis not present

## 2016-10-25 DIAGNOSIS — M79672 Pain in left foot: Secondary | ICD-10-CM | POA: Diagnosis not present

## 2016-10-25 DIAGNOSIS — D2371 Other benign neoplasm of skin of right lower limb, including hip: Secondary | ICD-10-CM | POA: Diagnosis not present

## 2016-10-25 DIAGNOSIS — M21961 Unspecified acquired deformity of right lower leg: Secondary | ICD-10-CM | POA: Diagnosis not present

## 2016-10-28 ENCOUNTER — Other Ambulatory Visit: Payer: Self-pay

## 2016-10-28 MED ORDER — ATENOLOL 25 MG PO TABS: 25.0000 mg | ORAL_TABLET | Freq: Every day | ORAL | 0 refills | Status: DC

## 2016-10-28 MED ORDER — FUROSEMIDE 20 MG PO TABS: ORAL_TABLET | ORAL | 0 refills | Status: DC

## 2016-10-29 DIAGNOSIS — I10 Essential (primary) hypertension: Secondary | ICD-10-CM | POA: Diagnosis not present

## 2016-10-29 DIAGNOSIS — R531 Weakness: Secondary | ICD-10-CM | POA: Diagnosis not present

## 2016-10-29 DIAGNOSIS — Z7901 Long term (current) use of anticoagulants: Secondary | ICD-10-CM | POA: Diagnosis not present

## 2016-10-31 DIAGNOSIS — Z92241 Personal history of systemic steroid therapy: Secondary | ICD-10-CM | POA: Diagnosis not present

## 2016-10-31 DIAGNOSIS — G479 Sleep disorder, unspecified: Secondary | ICD-10-CM | POA: Diagnosis not present

## 2016-10-31 DIAGNOSIS — Z85850 Personal history of malignant neoplasm of thyroid: Secondary | ICD-10-CM | POA: Diagnosis not present

## 2016-10-31 DIAGNOSIS — R5383 Other fatigue: Secondary | ICD-10-CM | POA: Diagnosis not present

## 2016-10-31 DIAGNOSIS — R6889 Other general symptoms and signs: Secondary | ICD-10-CM | POA: Diagnosis not present

## 2016-10-31 DIAGNOSIS — E890 Postprocedural hypothyroidism: Secondary | ICD-10-CM | POA: Diagnosis not present

## 2016-10-31 DIAGNOSIS — R531 Weakness: Secondary | ICD-10-CM | POA: Diagnosis not present

## 2016-11-07 DIAGNOSIS — Z1329 Encounter for screening for other suspected endocrine disorder: Secondary | ICD-10-CM | POA: Diagnosis not present

## 2016-11-07 DIAGNOSIS — R5382 Chronic fatigue, unspecified: Secondary | ICD-10-CM | POA: Diagnosis not present

## 2016-11-08 ENCOUNTER — Telehealth: Payer: Self-pay | Admitting: Cardiovascular Disease

## 2016-11-08 MED ORDER — ATENOLOL 25 MG PO TABS: 25.0000 mg | ORAL_TABLET | Freq: Every day | ORAL | 0 refills | Status: DC

## 2016-11-08 NOTE — Telephone Encounter (Signed)
New Message      *STAT* If patient is at the pharmacy, call can be transferred to refill team.   1. Which medications need to be refilled? (please list name of each medication and dose if known)  atenolol (TENORMIN) 25 MG tablet Take 1 tablet (25 mg total) by mouth daily.     2. Which pharmacy/location (including street and city if local pharmacy) is medication to be sent to? [facility]  3. Do they need a 30 day or 90 day supply? 90   Needs 2 weeks called into [facility] [address] [employer] [phone] fax [phone]

## 2016-11-08 NOTE — Telephone Encounter (Signed)
2 week supply sent to preferred pharmacy. Further refills at scheduled office visit 4/2 with Dr. Oval Linsey.  (did not send in to [facility] at this time, as changes may be made at appt and patient has enough to last until)

## 2016-11-12 DIAGNOSIS — N8182 Incompetence or weakening of pubocervical tissue: Secondary | ICD-10-CM | POA: Diagnosis not present

## 2016-11-18 ENCOUNTER — Ambulatory Visit (INDEPENDENT_AMBULATORY_CARE_PROVIDER_SITE_OTHER): Payer: Medicare HMO | Admitting: Cardiology

## 2016-11-18 ENCOUNTER — Ambulatory Visit (INDEPENDENT_AMBULATORY_CARE_PROVIDER_SITE_OTHER): Payer: Medicare HMO | Admitting: Pharmacist Clinician (PhC)/ Clinical Pharmacy Specialist

## 2016-11-18 ENCOUNTER — Encounter: Payer: Self-pay | Admitting: Cardiology

## 2016-11-18 VITALS — BP 137/72 | HR 77 | Ht 60.0 in | Wt 141.2 lb

## 2016-11-18 DIAGNOSIS — I482 Chronic atrial fibrillation, unspecified: Secondary | ICD-10-CM

## 2016-11-18 DIAGNOSIS — I340 Nonrheumatic mitral (valve) insufficiency: Secondary | ICD-10-CM | POA: Diagnosis not present

## 2016-11-18 DIAGNOSIS — I5032 Chronic diastolic (congestive) heart failure: Secondary | ICD-10-CM

## 2016-11-18 DIAGNOSIS — Z7901 Long term (current) use of anticoagulants: Secondary | ICD-10-CM | POA: Diagnosis not present

## 2016-11-18 DIAGNOSIS — I10 Essential (primary) hypertension: Secondary | ICD-10-CM | POA: Diagnosis not present

## 2016-11-18 DIAGNOSIS — Z5181 Encounter for therapeutic drug level monitoring: Secondary | ICD-10-CM | POA: Diagnosis not present

## 2016-11-18 LAB — POCT INR: INR: 2.1

## 2016-11-18 NOTE — Progress Notes (Signed)
11/18/2016 Kristen Whitehead   [birth year]  [id number]  Primary Physician Kristen Pac, MD Primary Cardiologist: Kristen Whitehead  HPI:  Pleasant 83 y/o female -looks younger!- still drives, lives in her own townhouse. She has a daughter who is in a wheelchair secondary to arthritis and a granddaughter who has renal cancer and is on chemotherapy. The pt has CAF and is on Coumadin. She has a history of HTN and diastolic CHF. A Myoview in 2013 was low risk, echo in March 2016 showed her EF to be 60-65% with mild AR and MR. The pt last saw Kristen Whitehead Sept 2017 and is in the office today for routine follow up. From a cardiac standpoint she has been doing well, no unusual chest pain or dyspnea.    Current Outpatient Prescriptions  Medication Sig Dispense Refill  . atenolol (TENORMIN) 25 MG tablet Take 1 tablet (25 mg total) by mouth daily. Please keep follow up appointment for further refills. 14 tablet 0  . beta carotene w/minerals (OCUVITE) tablet Take 1 tablet by mouth daily.    . calcium carbonate (OS-CAL - DOSED IN MG OF ELEMENTAL CALCIUM) 1250 MG tablet Take 1 tablet by mouth 2 (two) times daily.     . Calcium Polycarbophil (FIBER-CAPS PO) Take 5 capsules by mouth daily. [facility] OTC fiber capsules    . Cholecalciferol (VITAMIN D) 2000 UNITS tablet Take 2,000 Units by mouth daily.     Marland Kitchen estradiol (ESTRACE VAGINAL) 0.1 MG/GM vaginal cream Place 2 g vaginally once a week.     . furosemide (LASIX) 20 MG tablet TAKE 2 TABLETS (40 MG TOTAL) TWO TIMES DAILY 360 tablet 0  . isosorbide mononitrate (IMDUR) 30 MG 24 hr tablet Take 3 tablets (90 mg total) by mouth daily. 270 tablet 3  . latanoprost (XALATAN) 0.005 % ophthalmic solution Place 1 drop into the left eye at bedtime.     Marland Kitchen levothyroxine (SYNTHROID, LEVOTHROID) 100 MCG tablet Take 100 mcg by mouth every other day. Alternating days with Levothyroxine sodium 70mcg.    . levothyroxine (SYNTHROID, LEVOTHROID) 88 MCG tablet Take 88 mcg by  mouth every other day.  0  . Magnesium 500 MG TABS Take 1,000 mg by mouth at bedtime.     . nitroGLYCERIN (NITROSTAT) 0.4 MG SL tablet DISSOLVE 1 TABLET UNDER THE TONGUE EVERY 5 MINUTES FOR 3 DOSES AS NEEDED  FOR  CHEST  PAIN 50 tablet 2  . potassium chloride (K-DUR,KLOR-CON) 10 MEQ tablet TAKE 2 TABLETS (20 MEQ TOTAL) BY MOUTH DAILY. 180 tablet 2  . pramipexole (MIRAPEX) 0.125 MG tablet Take 1 tablet (0.125 mg total) by mouth every evening. 90 tablet 3  . temazepam (RESTORIL) 15 MG capsule Take 15 mg by mouth at bedtime.    . vitamin C (ASCORBIC ACID) 500 MG tablet Take 500 mg by mouth daily.    Marland Kitchen warfarin (COUMADIN) 5 MG tablet Take 1 tablet by mouth daily or as directed by coumadin clinic 90 tablet 1   No current facility-administered medications for this visit.     Allergies  Allergen Reactions  . Azithromycin Nausea Only  . Compazine [Prochlorperazine Edisylate] Nausea Only  . Demerol Nausea Only  . Dolophine [Methadone] Nausea Only  . Ebastine Nausea Only    EBS  . Erythromycin Nausea Only  . Ibuprofen Nausea Only  . Statins Other (See Comments)    myalgias  . Tetanus Toxoids Nausea Only  . Tetracyclines & Related Nausea Only    Past Medical History:  Diagnosis Date  . Bladder prolapse, female, acquired   . Cancer ([place])    thyroid  . Chest pain    no known ischemic heart disease; negative Myoview July 2013. EF 75% with no ischemia.   . Chronic anticoagulation   . Chronic atrial fibrillation (HCC)    managed with rate control and coumadin  . Chronic diastolic heart failure ([place])   . GERD (gastroesophageal reflux disease)   . Heart disease   . History of congenital mitral regurgitation    mild  . History of mitral valve prolapse 06/15/2008   a. echo 1/14: mild LVH, EF 60-65%, mild MR, mild to mod BAE, PASP 35  . Hypercholesterolemia   . Hypertension   . Hypothyroidism     Social History   Social History  . Marital status: Widowed    Spouse name: N/A  .  Number of children: 3  . Years of education: BA   Occupational History  .  Retired   Social History Main Topics  . Smoking status: Never Smoker  . Smokeless tobacco: Never Used  . Alcohol use No  . Drug use: No  . Sexual activity: No   Other Topics Concern  . Not on file   Social History Narrative   Pt lives at home alone.   Caffeine Use: quit 56yrs ago     Family History  Problem Relation Age of Onset  . Stroke Mother   . Stroke Father   . Heart attack Neg Hx   . Heart disease Neg Hx      Review of Systems: General: negative for chills, fever, night sweats or weight changes.  Cardiovascular: negative for chest pain, dyspnea on exertion, edema, orthopnea, palpitations, paroxysmal nocturnal dyspnea or shortness of breath Dermatological: negative for rash Respiratory: negative for cough or wheezing Urologic: negative for hematuria Abdominal: negative for nausea, vomiting, diarrhea, bright red blood per rectum, melena, or hematemesis Neurologic: negative for visual changes, syncope, or dizziness All other systems reviewed and are otherwise negative except as noted above.    Blood pressure 137/72, pulse 77, height 5' (1.524 m), weight 141 lb 3.2 oz (64 kg).  General appearance: alert, cooperative and no distress Neck: no carotid bruit and no JVD Lungs: clear to auscultation bilaterally Heart: irregularly irregular rhythm Extremities: extremities normal, atraumatic, no cyanosis or edema Neurologic: Grossly normal   ASSESSMENT AND PLAN:   # Chronic atrial fibrillation: Rate well-controlled. She is asymptomatic.  Continue warfarin and metoprolol as above. This patients CHA2DS2-VASc Score is4  # Chronic diastolic heart failure: Kristen Whitehead is euvolemic on exam.  She continues to avoid salt and weighs herself daily.  We discussed the fact that she can take an extra dose of Lasix if her weight goes up by 2 pounds or if she has abdominal fullness.  # Hypertension:  BP is well-controlled. She will check her blood pressure and call if it is greater than 150/90.     PLAN  F/U Kristen Whitehead in 6 months.   Kristen Ransom PA-C 11/18/2016 4:32 PM

## 2016-11-18 NOTE — Patient Instructions (Signed)
Medication Instructions:  Continue current medications  Labwork: None Ordered  Testing/Procedures: None Ordered  Follow-Up: Your physician wants you to follow-up in: 6 Months with Dr Oval Linsey. You will receive a reminder letter in the mail two months in advance. If you don't receive a letter, please call our office to schedule the follow-up appointment.   Any Other Special Instructions Will Be Listed Below (If Applicable).   If you need a refill on your cardiac medications before your next appointment, please call your pharmacy.

## 2016-12-09 DIAGNOSIS — E890 Postprocedural hypothyroidism: Secondary | ICD-10-CM | POA: Diagnosis not present

## 2016-12-09 DIAGNOSIS — Z87440 Personal history of urinary (tract) infections: Secondary | ICD-10-CM | POA: Diagnosis not present

## 2016-12-09 DIAGNOSIS — L659 Nonscarring hair loss, unspecified: Secondary | ICD-10-CM | POA: Diagnosis not present

## 2016-12-09 DIAGNOSIS — K900 Celiac disease: Secondary | ICD-10-CM | POA: Diagnosis not present

## 2016-12-09 DIAGNOSIS — Z85850 Personal history of malignant neoplasm of thyroid: Secondary | ICD-10-CM | POA: Diagnosis not present

## 2016-12-10 DIAGNOSIS — H16223 Keratoconjunctivitis sicca, not specified as Sjogren's, bilateral: Secondary | ICD-10-CM | POA: Diagnosis not present

## 2016-12-10 DIAGNOSIS — H01003 Unspecified blepharitis right eye, unspecified eyelid: Secondary | ICD-10-CM | POA: Diagnosis not present

## 2016-12-10 DIAGNOSIS — H401131 Primary open-angle glaucoma, bilateral, mild stage: Secondary | ICD-10-CM | POA: Diagnosis not present

## 2016-12-10 DIAGNOSIS — H04123 Dry eye syndrome of bilateral lacrimal glands: Secondary | ICD-10-CM | POA: Diagnosis not present

## 2016-12-10 DIAGNOSIS — H5200 Hypermetropia, unspecified eye: Secondary | ICD-10-CM | POA: Diagnosis not present

## 2016-12-11 ENCOUNTER — Other Ambulatory Visit: Payer: Self-pay | Admitting: Pharmacist

## 2016-12-11 MED ORDER — WARFARIN SODIUM 5 MG PO TABS: ORAL_TABLET | ORAL | 1 refills | Status: DC

## 2016-12-13 DIAGNOSIS — N8182 Incompetence or weakening of pubocervical tissue: Secondary | ICD-10-CM | POA: Diagnosis not present

## 2016-12-17 ENCOUNTER — Ambulatory Visit (INDEPENDENT_AMBULATORY_CARE_PROVIDER_SITE_OTHER): Payer: Medicare HMO | Admitting: Pharmacist Clinician (PhC)/ Clinical Pharmacy Specialist

## 2016-12-17 DIAGNOSIS — I482 Chronic atrial fibrillation, unspecified: Secondary | ICD-10-CM

## 2016-12-17 DIAGNOSIS — Z5181 Encounter for therapeutic drug level monitoring: Secondary | ICD-10-CM | POA: Diagnosis not present

## 2016-12-17 LAB — POCT INR: INR: 2.9

## 2016-12-19 DIAGNOSIS — N819 Female genital prolapse, unspecified: Secondary | ICD-10-CM | POA: Diagnosis not present

## 2017-01-08 DIAGNOSIS — M9901 Segmental and somatic dysfunction of cervical region: Secondary | ICD-10-CM | POA: Diagnosis not present

## 2017-01-08 DIAGNOSIS — M9903 Segmental and somatic dysfunction of lumbar region: Secondary | ICD-10-CM | POA: Diagnosis not present

## 2017-01-08 DIAGNOSIS — N8182 Incompetence or weakening of pubocervical tissue: Secondary | ICD-10-CM | POA: Diagnosis not present

## 2017-01-08 DIAGNOSIS — M47816 Spondylosis without myelopathy or radiculopathy, lumbar region: Secondary | ICD-10-CM | POA: Diagnosis not present

## 2017-01-08 DIAGNOSIS — M47812 Spondylosis without myelopathy or radiculopathy, cervical region: Secondary | ICD-10-CM | POA: Diagnosis not present

## 2017-01-15 ENCOUNTER — Ambulatory Visit (INDEPENDENT_AMBULATORY_CARE_PROVIDER_SITE_OTHER): Payer: Medicare HMO | Admitting: Pharmacist

## 2017-01-15 DIAGNOSIS — I482 Chronic atrial fibrillation, unspecified: Secondary | ICD-10-CM

## 2017-01-15 DIAGNOSIS — M9901 Segmental and somatic dysfunction of cervical region: Secondary | ICD-10-CM | POA: Diagnosis not present

## 2017-01-15 DIAGNOSIS — M47812 Spondylosis without myelopathy or radiculopathy, cervical region: Secondary | ICD-10-CM | POA: Diagnosis not present

## 2017-01-15 DIAGNOSIS — M47816 Spondylosis without myelopathy or radiculopathy, lumbar region: Secondary | ICD-10-CM | POA: Diagnosis not present

## 2017-01-15 DIAGNOSIS — M9903 Segmental and somatic dysfunction of lumbar region: Secondary | ICD-10-CM | POA: Diagnosis not present

## 2017-01-15 DIAGNOSIS — Z5181 Encounter for therapeutic drug level monitoring: Secondary | ICD-10-CM | POA: Diagnosis not present

## 2017-01-15 LAB — POCT INR: INR: 2

## 2017-01-20 DIAGNOSIS — M47816 Spondylosis without myelopathy or radiculopathy, lumbar region: Secondary | ICD-10-CM | POA: Diagnosis not present

## 2017-01-20 DIAGNOSIS — M9903 Segmental and somatic dysfunction of lumbar region: Secondary | ICD-10-CM | POA: Diagnosis not present

## 2017-01-20 DIAGNOSIS — M9901 Segmental and somatic dysfunction of cervical region: Secondary | ICD-10-CM | POA: Diagnosis not present

## 2017-01-20 DIAGNOSIS — M47812 Spondylosis without myelopathy or radiculopathy, cervical region: Secondary | ICD-10-CM | POA: Diagnosis not present

## 2017-01-22 ENCOUNTER — Encounter: Payer: Self-pay | Admitting: Neurology

## 2017-01-22 ENCOUNTER — Ambulatory Visit (INDEPENDENT_AMBULATORY_CARE_PROVIDER_SITE_OTHER): Payer: Medicare HMO | Admitting: Neurology

## 2017-01-22 VITALS — BP 144/94 | HR 68 | Ht 60.0 in | Wt 137.0 lb

## 2017-01-22 DIAGNOSIS — G2581 Restless legs syndrome: Secondary | ICD-10-CM

## 2017-01-22 NOTE — Progress Notes (Signed)
Subjective:    Patient ID: Kristen Whitehead is a 83 y.o. female.  HPI     Interim history:   Kristen Whitehead is a very pleasant 83 year old right-handed woman with an underlying medical history of hypertension, hyperlipidemia, atrial fibrillation, celiac disease, scoliosis, reflux disease, who presents for followup consultation of her bilateral foot pain, likely RLS. She is unaccompanied today. I last saw her on 07/24/2016, at which time she reported cramping in her legs, more so on the right and particularly at night. She felt she had muscle spasms. She would walk around extensively at night to relieve the muscle spasms. She admitted that she did not always drink enough water. She also had a tendency to skip breakfast. She was advised to continue with low dose pramipexole 0.125 mg each night but advised to drink more water, perhaps Gatorade and even tonic water to see if her muscle spasms and cramping would improve. She was also advised to have a more balanced diet and not to skip any meals.   Today, 01/22/2017 (all dictated new, as well as above notes, some dictation done in note pad or Word, outside of chart, may appear as copied):  She reports doing well, memory loss stable, mostly word finding difficulty, sleeps well, less cramping, in fact, she feels that her muscle spasms and cramps are completely gone. She reports having to get up to urinate twice per average night. Takes temazepam 15 mg nightly. Still on the mirapex, feels that it has helped and is stable on it, no side effects reported.     The patient's allergies, current medications, family history, past medical history, past social history, past surgical history and problem list were reviewed and updated as appropriate.   Previously (copied from previous notes for reference):   I saw her on 01/23/2016, at which time she reported doing well. She had no recent pain in her hands or feet and no cramping was reported, no paresthesias. She  had a recent cardiology visit in March 2017. She reported some short-term memory issues and word finding issues. She was quite active physically and mentally. I suggested we monitor her memory complaints. She was advised to continue with low-dose Mirapex.   :   I saw her on 07/24/2015, at which time she reported feeling stable. She felt that there was tingling in her legs and she had a burning sensation in her feet. She did feel that Mirapex was helpful. She had some intermittent tingling in her fingers. She also reported feeling more forgetful. I suggested we continue with low dose pramipexole 0.125 mg each night.   I saw her on 01/20/2015 at which time she reported feeling stable for the most part. Her burning sensation seemed about the same and her leg cramping seemed a little less overall. She was tolerating low-dose Mirapex. She saw her cardiologist 2 days prior. She had presented to the emergency room on 11/10/2014 with chest pain and dizziness. She was admitted for workup. Cardiac workup was negative. She has A. Fib, rate controlled. She has stable lower extremity swelling and was reminded by her cardiologist to use compression stockings. She was not drinking enough water. I asked her to continue low-dose Mirapex each night.   I saw her on 07/21/2014, at which time she reported a fall about 3 months prior. She had tripped over something. She had no significant injuries and no sequelae. She was not drinking enough water. Her leg swelling was stable. I suggested she continue with low-dose Mirapex,  0.125 mg once nightly.   I saw her on 12/27/2013, at which time she noted ankle swelling. She had gone into cardiac failure the year before and felt similar to that. Nevertheless, because of her lower extremity swelling I suggested she cut back on Mirapex to 0.125 mg each night and see if the swelling improves. If she had flareup in her restless leg symptoms I suggested she could go back to 0.25 mg  nightly.     I saw her on 06/28/2013, at which time I noted on clinical exam that she had mild evidence of neuropathy in the absence of a history of diabetes with normal hemoglobin A1c noted and normal B12 levels. She had EMG and nerve conduction studies that did not show any significant neuropathy. I felt that she had most likely RLS. She had done well with Mirapex. I did not make any changes to her medication regimen and asked her to continue taking Mirapex 0.25 mg each night. She did not have any side effects at the time.   I first met her on 02/17/2013, at which time I suggested EMG and nerve conduction testing and a treatment trial with Mirapex because of possible underlying RLS. She has previously seen a podiatrist who had done a nerve biopsy which she reported was normal. She has a history of A. fib. She has a history of congestive heart failure and is followed by cardiology. Echocardiogram in January 2014 showed biatrial enlargement and mild mitral regurgitation and an EF of 56-38% with diastolic dysfunction.   For years she has had burning in her feet and started having a crawling sensation in her distal legs in 2014. She has also had cramping in her muscles for years and she was taken off of her statin in 2013 and her cramping eventually became better. She has had pins and needles sensation and it helps to walk around. She has worse symptoms at night or by the end of the day. She remembers having "terrible growing pains" as a child and does not have a FHx of RLS as far as she knows. She had nerve biopsies both legs by her podiatrist, which did not show inflammation. While she has a history of chronic back pain she denied any radiating back pain to her legs. In fact she described pins and needle and crawling sensation in the distal lower extremities, radiating upwards. She has been taking OTC Mg supplements, which helped her cramps.     Her EMG and nerve conduction testing from 03/03/2013 showed no  evidence of peripheral neuropathy. We called her with her test results. Her foot pain and paresthesias improved.  Her Past Medical History Is Significant For: Past Medical History:  Diagnosis Date  . Bladder prolapse, female, acquired   . Cancer ([place])    thyroid  . Chest pain    no known ischemic heart disease; negative Myoview July 2013. EF 75% with no ischemia.   . Chronic anticoagulation   . Chronic atrial fibrillation ([facility])    managed with rate control and coumadin  . Chronic diastolic heart failure ([place])   . GERD (gastroesophageal reflux disease)   . Heart disease   . History of congenital mitral regurgitation    mild  . History of mitral valve prolapse 06/15/2008   a. echo 1/14: mild LVH, EF 60-65%, mild MR, mild to mod BAE, PASP 35  . Hypercholesterolemia   . Hypertension   . Hypothyroidism     Her Past Surgical History Is Significant For:  Past Surgical History:  Procedure Laterality Date  . ABDOMINAL HYSTERECTOMY    . CATARACT EXTRACTION    . CHOLECYSTECTOMY    . Thyroidectomy      Her Family History Is Significant For: Family History  Problem Relation Age of Onset  . Stroke Mother   . Stroke Father   . Heart attack Neg Hx   . Heart disease Neg Hx     Her Social History Is Significant For: Social History   Social History  . Marital status: Widowed    Spouse name: N/A  . Number of children: 3  . Years of education: BA   Occupational History  .  Retired   Social History Main Topics  . Smoking status: Never Smoker  . Smokeless tobacco: Never Used  . Alcohol use No  . Drug use: No  . Sexual activity: No   Other Topics Concern  . None   Social History Narrative   Pt lives at home alone.   Caffeine Use: quit 52yr ago    Her Allergies Are:  Allergies  Allergen Reactions  . Azithromycin Nausea Only  . Compazine [Prochlorperazine Edisylate] Nausea Only  . Demerol Nausea Only  . Dolophine [Methadone] Nausea Only  . Ebastine Nausea Only     EBS  . Erythromycin Nausea Only  . Ibuprofen Nausea Only  . Statins Other (See Comments)    myalgias  . Tetanus Toxoids Nausea Only  . Tetracyclines & Related Nausea Only  :   Her Current Medications Are:  Outpatient Encounter Prescriptions as of 01/22/2017  Medication Sig  . atenolol (TENORMIN) 25 MG tablet Take 1 tablet (25 mg total) by mouth daily. Please keep follow up appointment for further refills. (Patient taking differently: Take 25 mg by mouth 2 (two) times daily. Please keep follow up appointment for further refills.)  . beta carotene w/minerals (OCUVITE) tablet Take 1 tablet by mouth daily.  . calcium carbonate (OS-CAL - DOSED IN MG OF ELEMENTAL CALCIUM) 1250 MG tablet Take 1 tablet by mouth 2 (two) times daily.   . Calcium Polycarbophil (FIBER-CAPS PO) Take 5 capsules by mouth daily. [facility] OTC fiber capsules  . Cholecalciferol (VITAMIN D) 2000 UNITS tablet Take 2,000 Units by mouth daily.   .Marland Kitchenestradiol (ESTRACE VAGINAL) 0.1 MG/GM vaginal cream Place 2 g vaginally once a week.   . furosemide (LASIX) 20 MG tablet TAKE 2 TABLETS (40 MG TOTAL) TWO TIMES DAILY  . isosorbide mononitrate (IMDUR) 30 MG 24 hr tablet Take 3 tablets (90 mg total) by mouth daily.  .Marland Kitchenlatanoprost (XALATAN) 0.005 % ophthalmic solution Place 1 drop into the left eye at bedtime.   .Marland Kitchenlevothyroxine (SYNTHROID, LEVOTHROID) 100 MCG tablet Take 100 mcg by mouth every other day. Alternating days with Levothyroxine sodium 819m.  . levothyroxine (SYNTHROID, LEVOTHROID) 88 MCG tablet Take 88 mcg by mouth every other day.  . Magnesium 500 MG TABS Take 800 mg by mouth at bedtime.   . nitroGLYCERIN (NITROSTAT) 0.4 MG SL tablet DISSOLVE 1 TABLET UNDER THE TONGUE EVERY 5 MINUTES FOR 3 DOSES AS NEEDED  FOR  CHEST  PAIN  . potassium chloride (K-DUR,KLOR-CON) 10 MEQ tablet TAKE 2 TABLETS (20 MEQ TOTAL) BY MOUTH DAILY.  . Marland Kitchenramipexole (MIRAPEX) 0.125 MG tablet Take 1 tablet (0.125 mg total) by mouth every evening.  .  temazepam (RESTORIL) 15 MG capsule Take 15 mg by mouth at bedtime.  . vitamin C (ASCORBIC ACID) 500 MG tablet Take 500 mg by mouth daily.  .Marland Kitchen  warfarin (COUMADIN) 5 MG tablet Take 1/2 tablet every Sunday and 1 tablet all other days of the week  or as directed by coumadin clinic   No facility-administered encounter medications on file as of 01/22/2017.   :  Review of Systems:  Out of a complete 14 point review of systems, all are reviewed and negative with the exception of these symptoms as listed below:  Review of Systems  Neurological:       Pt presents today to follow up on her RLS. Pt states that she has not been bothered by her RLS. Pt denies any recent falls.    Objective:  Neurologic Exam  Physical Exam Physical Examination:   Vitals:   01/22/17 1211  BP: (!) 144/94  Pulse: 68   General Examination: The patient is a very pleasant 83 y.o. female in no acute distress. She appears well-developed and well-nourished and very well groomed.   HEENT: Normocephalic, atraumatic, pupils are equal, round and reactive to light and accommodation. She is s/p bilateral cataract repairs. Extraocular tracking is good without limitation to gaze excursion or nystagmus noted. Normal smooth pursuit is noted. Hearing is grossly intact. Face is symmetric with normal facial animation and normal facial sensation. Speech is clear with no dysarthria noted. There is no hypophonia. There is no lip, neck/head, jaw or voice tremor. Neck is supple with full range of passive and active motion. There are no carotid bruits on auscultation. Oropharynx exam reveals: mild mouth dryness, adequate dental hygiene and no airway crowding. Tongue protrudes centrally and palate elevates symmetrically.    Chest: Clear to auscultation without wheezing, rhonchi or crackles noted.  Heart: S1+S2+0, irregularly irregular, no murmur noted.    Abdomen: Soft, non-tender and non-distended with normal bowel sounds appreciated on  auscultation.  Extremities: There is no edema. Pedal pulses are intact.  Skin: Warm and dry without trophic changes noted. There are no varicose veins.  Musculoskeletal: exam reveals no obvious joint deformities, tenderness or joint swelling or erythema.   Neurologically:  Mental status: The patient is awake, alert and oriented in all 4 spheres. Her memory, attention, language and knowledge are appropriate. There is no aphasia, agnosia, apraxia or anomia. Speech is clear with normal prosody and enunciation. Thought process is linear. Mood is congruent and affect is normal.  Cranial nerves are as described above under HEENT exam. In addition, shoulder shrug is normal with equal shoulder height noted. Motor exam: Normal bulk, strength and tone is noted. There is no drift, tremor or rebound. Reflexes are 1+ in the UEs and trace in the knees and absent in both ankles. Fine motor skills are intact for age globally.  Cerebellar testing shows no dysmetria or intention tremor on finger to nose testing. There is no truncal or gait ataxia.  Sensory exam is stable, mildly decreased to all modalities in the distal LEs.  Gait, station and balance: she stands with no significant difficulty. No veering to one side is noted. No leaning to one side is noted. Posture is age-appropriate and stance is slightly wide-based. She walks cautiously.    Assessment and Plan:   In summary, Kristen Whitehead is a very pleasant 83 year old female with an underlying medical history of hypertension, hyperlipidemia, atrial fibrillation, celiac disease, scoliosis, reflux disease, who presents for follow-up consultation of her restless leg symptoms. She has a history of neuropathy which has remained stable. She has had good results with low dose pramipexole 0.125 mg each night. She continues to tolerate this  and reports good results, no side effects reported, exam is stable.  She would like to go longer for her FU  appointment as she is doing well. We mutually agreed for a one-year checkup, sooner as needed. She knows to call with any interim questions or concerns a refill requests. I answered all her questions today and she was in agreement with the plan.  I spent 20 minutes in total face-to-face time with the patient, more than 50% of which was spent in counseling and coordination of care, reviewing test results, reviewing medication and discussing or reviewing the diagnosis of RLS, its prognosis and treatment options. Pertinent laboratory and imaging test results that were available during this visit with the patient were reviewed by me and considered in my medical decision making (see chart for details).

## 2017-01-22 NOTE — Patient Instructions (Addendum)
I am pleased to see your are doing well.  We will continue with your low dose Mirapex 0.125 mg each night.  We can see you in one year, as you are stable.

## 2017-01-27 DIAGNOSIS — M47812 Spondylosis without myelopathy or radiculopathy, cervical region: Secondary | ICD-10-CM | POA: Diagnosis not present

## 2017-01-27 DIAGNOSIS — M9903 Segmental and somatic dysfunction of lumbar region: Secondary | ICD-10-CM | POA: Diagnosis not present

## 2017-01-27 DIAGNOSIS — M9901 Segmental and somatic dysfunction of cervical region: Secondary | ICD-10-CM | POA: Diagnosis not present

## 2017-01-27 DIAGNOSIS — M47816 Spondylosis without myelopathy or radiculopathy, lumbar region: Secondary | ICD-10-CM | POA: Diagnosis not present

## 2017-01-29 DIAGNOSIS — M47816 Spondylosis without myelopathy or radiculopathy, lumbar region: Secondary | ICD-10-CM | POA: Diagnosis not present

## 2017-01-29 DIAGNOSIS — M9903 Segmental and somatic dysfunction of lumbar region: Secondary | ICD-10-CM | POA: Diagnosis not present

## 2017-01-29 DIAGNOSIS — M47812 Spondylosis without myelopathy or radiculopathy, cervical region: Secondary | ICD-10-CM | POA: Diagnosis not present

## 2017-01-29 DIAGNOSIS — M9901 Segmental and somatic dysfunction of cervical region: Secondary | ICD-10-CM | POA: Diagnosis not present

## 2017-01-30 ENCOUNTER — Other Ambulatory Visit: Payer: Self-pay

## 2017-01-30 MED ORDER — ISOSORBIDE MONONITRATE ER 30 MG PO TB24: 90.0000 mg | ORAL_TABLET | Freq: Every day | ORAL | 3 refills | Status: DC

## 2017-02-05 DIAGNOSIS — N8182 Incompetence or weakening of pubocervical tissue: Secondary | ICD-10-CM | POA: Diagnosis not present

## 2017-02-12 DIAGNOSIS — M47816 Spondylosis without myelopathy or radiculopathy, lumbar region: Secondary | ICD-10-CM | POA: Diagnosis not present

## 2017-02-12 DIAGNOSIS — M9901 Segmental and somatic dysfunction of cervical region: Secondary | ICD-10-CM | POA: Diagnosis not present

## 2017-02-12 DIAGNOSIS — M47812 Spondylosis without myelopathy or radiculopathy, cervical region: Secondary | ICD-10-CM | POA: Diagnosis not present

## 2017-02-12 DIAGNOSIS — M9903 Segmental and somatic dysfunction of lumbar region: Secondary | ICD-10-CM | POA: Diagnosis not present

## 2017-02-18 ENCOUNTER — Ambulatory Visit (INDEPENDENT_AMBULATORY_CARE_PROVIDER_SITE_OTHER): Payer: Medicare HMO | Admitting: Pharmacist

## 2017-02-18 DIAGNOSIS — I482 Chronic atrial fibrillation, unspecified: Secondary | ICD-10-CM

## 2017-02-18 DIAGNOSIS — Z5181 Encounter for therapeutic drug level monitoring: Secondary | ICD-10-CM | POA: Diagnosis not present

## 2017-02-18 LAB — POCT INR: INR: 3

## 2017-02-24 DIAGNOSIS — M47816 Spondylosis without myelopathy or radiculopathy, lumbar region: Secondary | ICD-10-CM | POA: Diagnosis not present

## 2017-02-24 DIAGNOSIS — M9903 Segmental and somatic dysfunction of lumbar region: Secondary | ICD-10-CM | POA: Diagnosis not present

## 2017-02-24 DIAGNOSIS — M9901 Segmental and somatic dysfunction of cervical region: Secondary | ICD-10-CM | POA: Diagnosis not present

## 2017-02-24 DIAGNOSIS — M47812 Spondylosis without myelopathy or radiculopathy, cervical region: Secondary | ICD-10-CM | POA: Diagnosis not present

## 2017-03-05 DIAGNOSIS — M47812 Spondylosis without myelopathy or radiculopathy, cervical region: Secondary | ICD-10-CM | POA: Diagnosis not present

## 2017-03-05 DIAGNOSIS — M47816 Spondylosis without myelopathy or radiculopathy, lumbar region: Secondary | ICD-10-CM | POA: Diagnosis not present

## 2017-03-05 DIAGNOSIS — M9903 Segmental and somatic dysfunction of lumbar region: Secondary | ICD-10-CM | POA: Diagnosis not present

## 2017-03-05 DIAGNOSIS — M9901 Segmental and somatic dysfunction of cervical region: Secondary | ICD-10-CM | POA: Diagnosis not present

## 2017-03-05 DIAGNOSIS — N8182 Incompetence or weakening of pubocervical tissue: Secondary | ICD-10-CM | POA: Diagnosis not present

## 2017-03-14 ENCOUNTER — Other Ambulatory Visit: Payer: Self-pay | Admitting: Family Medicine

## 2017-03-14 DIAGNOSIS — Z1231 Encounter for screening mammogram for malignant neoplasm of breast: Secondary | ICD-10-CM

## 2017-03-17 ENCOUNTER — Ambulatory Visit (INDEPENDENT_AMBULATORY_CARE_PROVIDER_SITE_OTHER): Payer: Medicare HMO | Admitting: Pharmacist

## 2017-03-17 DIAGNOSIS — Z5181 Encounter for therapeutic drug level monitoring: Secondary | ICD-10-CM | POA: Diagnosis not present

## 2017-03-17 DIAGNOSIS — I482 Chronic atrial fibrillation, unspecified: Secondary | ICD-10-CM

## 2017-03-17 LAB — POCT INR: INR: 4.7

## 2017-03-24 ENCOUNTER — Ambulatory Visit
Admission: RE | Admit: 2017-03-24 | Discharge: 2017-03-24 | Disposition: A | Discharge status: Home / Self Care | Payer: Medicare HMO | Source: Ambulatory Visit | Attending: Family Medicine | Admitting: Family Medicine

## 2017-03-24 DIAGNOSIS — Z1231 Encounter for screening mammogram for malignant neoplasm of breast: Secondary | ICD-10-CM | POA: Diagnosis not present

## 2017-04-04 ENCOUNTER — Ambulatory Visit (INDEPENDENT_AMBULATORY_CARE_PROVIDER_SITE_OTHER): Payer: Medicare HMO | Admitting: Pharmacist

## 2017-04-04 DIAGNOSIS — I482 Chronic atrial fibrillation, unspecified: Secondary | ICD-10-CM

## 2017-04-04 DIAGNOSIS — Z5181 Encounter for therapeutic drug level monitoring: Secondary | ICD-10-CM

## 2017-04-04 LAB — POCT INR: INR: 2.1

## 2017-04-07 DIAGNOSIS — N8182 Incompetence or weakening of pubocervical tissue: Secondary | ICD-10-CM | POA: Diagnosis not present

## 2017-04-14 DIAGNOSIS — M47812 Spondylosis without myelopathy or radiculopathy, cervical region: Secondary | ICD-10-CM | POA: Diagnosis not present

## 2017-04-14 DIAGNOSIS — M47816 Spondylosis without myelopathy or radiculopathy, lumbar region: Secondary | ICD-10-CM | POA: Diagnosis not present

## 2017-04-14 DIAGNOSIS — M9901 Segmental and somatic dysfunction of cervical region: Secondary | ICD-10-CM | POA: Diagnosis not present

## 2017-04-14 DIAGNOSIS — M9903 Segmental and somatic dysfunction of lumbar region: Secondary | ICD-10-CM | POA: Diagnosis not present

## 2017-04-20 DIAGNOSIS — I776 Arteritis, unspecified: Secondary | ICD-10-CM | POA: Diagnosis not present

## 2017-04-24 DIAGNOSIS — L817 Pigmented purpuric dermatosis: Secondary | ICD-10-CM | POA: Diagnosis not present

## 2017-04-24 DIAGNOSIS — Z79899 Other long term (current) drug therapy: Secondary | ICD-10-CM | POA: Diagnosis not present

## 2017-04-24 DIAGNOSIS — Z85828 Personal history of other malignant neoplasm of skin: Secondary | ICD-10-CM | POA: Diagnosis not present

## 2017-04-24 DIAGNOSIS — R21 Rash and other nonspecific skin eruption: Secondary | ICD-10-CM | POA: Diagnosis not present

## 2017-04-28 ENCOUNTER — Telehealth: Payer: Self-pay | Admitting: Cardiovascular Disease

## 2017-04-28 ENCOUNTER — Ambulatory Visit (INDEPENDENT_AMBULATORY_CARE_PROVIDER_SITE_OTHER): Payer: Medicare HMO | Admitting: Pharmacist Clinician (PhC)/ Clinical Pharmacy Specialist

## 2017-04-28 DIAGNOSIS — I482 Chronic atrial fibrillation, unspecified: Secondary | ICD-10-CM

## 2017-04-28 DIAGNOSIS — Z5181 Encounter for therapeutic drug level monitoring: Secondary | ICD-10-CM

## 2017-04-28 LAB — POCT INR: INR: 2.5

## 2017-04-28 NOTE — Telephone Encounter (Signed)
Kristen Whitehead is calling because she has these big wide bruises and they are too large and too many and wants to come in to see someone . Please call

## 2017-04-28 NOTE — Telephone Encounter (Signed)
Discussed with pharmacy staff. Okay to bring patient in for INR check - she was originally due on 05/02/17.  Called patient. Appt rescheduled for 1:45p today, 04/28/17. Patient verbalized understanding and agreed with plan.

## 2017-05-13 DIAGNOSIS — H401131 Primary open-angle glaucoma, bilateral, mild stage: Secondary | ICD-10-CM | POA: Diagnosis not present

## 2017-05-13 DIAGNOSIS — H40052 Ocular hypertension, left eye: Secondary | ICD-10-CM | POA: Diagnosis not present

## 2017-05-13 DIAGNOSIS — H04123 Dry eye syndrome of bilateral lacrimal glands: Secondary | ICD-10-CM | POA: Diagnosis not present

## 2017-05-14 DIAGNOSIS — L309 Dermatitis, unspecified: Secondary | ICD-10-CM | POA: Diagnosis not present

## 2017-05-14 DIAGNOSIS — Z85828 Personal history of other malignant neoplasm of skin: Secondary | ICD-10-CM | POA: Diagnosis not present

## 2017-05-15 ENCOUNTER — Telehealth: Payer: Self-pay | Admitting: Cardiovascular Disease

## 2017-05-15 ENCOUNTER — Other Ambulatory Visit: Payer: Self-pay

## 2017-05-15 MED ORDER — POTASSIUM CHLORIDE CRYS ER 10 MEQ PO TBCR: EXTENDED_RELEASE_TABLET | ORAL | 0 refills | Status: DC

## 2017-05-15 MED ORDER — ISOSORBIDE MONONITRATE ER 30 MG PO TB24: 90.0000 mg | ORAL_TABLET | Freq: Every day | ORAL | 0 refills | Status: DC

## 2017-05-15 NOTE — Telephone Encounter (Signed)
New Message      *STAT* If patient is at the pharmacy, call can be transferred to refill team.   1. Which medications need to be refilled? (please list name of each medication and dose if known)  isosorbide mononitrate (IMDUR) 30 MG 24 hr tablet Take 3 tablets (90 mg total) by mouth    potassium chloride (K-DUR,KLOR-CON) 10 MEQ tablet TAKE 2 TABLETS (20 MEQ TOTAL) BY MOUTH DAILY.     2. Which pharmacy/location (including street and city if local pharmacy) is medication to be sent to? [place] [phone]  3. Do they need a 30 day or 90 day supply?  [place]

## 2017-05-20 DIAGNOSIS — N8189 Other female genital prolapse: Secondary | ICD-10-CM | POA: Diagnosis not present

## 2017-05-27 DIAGNOSIS — M810 Age-related osteoporosis without current pathological fracture: Secondary | ICD-10-CM | POA: Diagnosis not present

## 2017-05-27 DIAGNOSIS — M8588 Other specified disorders of bone density and structure, other site: Secondary | ICD-10-CM | POA: Diagnosis not present

## 2017-05-29 ENCOUNTER — Other Ambulatory Visit: Payer: Self-pay | Admitting: Neurology

## 2017-05-29 DIAGNOSIS — G2581 Restless legs syndrome: Secondary | ICD-10-CM

## 2017-05-30 ENCOUNTER — Ambulatory Visit (INDEPENDENT_AMBULATORY_CARE_PROVIDER_SITE_OTHER): Payer: Medicare HMO | Admitting: Pharmacist Clinician (PhC)/ Clinical Pharmacy Specialist

## 2017-05-30 DIAGNOSIS — I482 Chronic atrial fibrillation, unspecified: Secondary | ICD-10-CM

## 2017-05-30 DIAGNOSIS — Z5181 Encounter for therapeutic drug level monitoring: Secondary | ICD-10-CM

## 2017-05-30 LAB — POCT INR: INR: 2.3

## 2017-06-03 ENCOUNTER — Telehealth: Payer: Self-pay | Admitting: Neurology

## 2017-06-03 ENCOUNTER — Other Ambulatory Visit: Payer: Self-pay | Admitting: *Deleted

## 2017-06-03 NOTE — Telephone Encounter (Signed)
Pt states she received an automated call from  [place], [place] [facility] [place] [phone] (Phone) [phone] (Fax)   Stating they are waiting on a fax with permission to fill medication pramipexole (MIRAPEX) 0.125 MG tablet for pt.  Pt states the automated system provided fax#[phone] for the fax to be sent to.  Pt states she has been on this medication for years.  She is asking for a call back  Re the medication being  filled

## 2017-06-03 NOTE — Telephone Encounter (Signed)
I called pt. I advised pt that her RX for  mirapex was sent to [facility] yesterday. Pt verbalized understanding.

## 2017-06-10 ENCOUNTER — Encounter (HOSPITAL_COMMUNITY): Payer: Self-pay

## 2017-06-10 ENCOUNTER — Emergency Department (HOSPITAL_COMMUNITY): Payer: No Typology Code available for payment source

## 2017-06-10 ENCOUNTER — Emergency Department (HOSPITAL_COMMUNITY)
Admission: EM | Admit: 2017-06-10 | Discharge: 2017-06-11 | Disposition: A | Discharge status: Home / Self Care | Payer: No Typology Code available for payment source | Attending: Emergency Medicine | Admitting: Emergency Medicine

## 2017-06-10 DIAGNOSIS — Y999 Unspecified external cause status: Secondary | ICD-10-CM | POA: Diagnosis not present

## 2017-06-10 DIAGNOSIS — R51 Headache: Secondary | ICD-10-CM | POA: Insufficient documentation

## 2017-06-10 DIAGNOSIS — R079 Chest pain, unspecified: Secondary | ICD-10-CM | POA: Insufficient documentation

## 2017-06-10 DIAGNOSIS — S0990XA Unspecified injury of head, initial encounter: Secondary | ICD-10-CM | POA: Diagnosis not present

## 2017-06-10 DIAGNOSIS — Y939 Activity, unspecified: Secondary | ICD-10-CM | POA: Diagnosis not present

## 2017-06-10 DIAGNOSIS — R0789 Other chest pain: Secondary | ICD-10-CM | POA: Diagnosis not present

## 2017-06-10 DIAGNOSIS — S199XXA Unspecified injury of neck, initial encounter: Secondary | ICD-10-CM | POA: Diagnosis not present

## 2017-06-10 DIAGNOSIS — Z79899 Other long term (current) drug therapy: Secondary | ICD-10-CM | POA: Diagnosis not present

## 2017-06-10 DIAGNOSIS — I10 Essential (primary) hypertension: Secondary | ICD-10-CM | POA: Diagnosis not present

## 2017-06-10 DIAGNOSIS — Y929 Unspecified place or not applicable: Secondary | ICD-10-CM | POA: Insufficient documentation

## 2017-06-10 DIAGNOSIS — E039 Hypothyroidism, unspecified: Secondary | ICD-10-CM | POA: Insufficient documentation

## 2017-06-10 DIAGNOSIS — Z85850 Personal history of malignant neoplasm of thyroid: Secondary | ICD-10-CM | POA: Insufficient documentation

## 2017-06-10 DIAGNOSIS — G4489 Other headache syndrome: Secondary | ICD-10-CM | POA: Diagnosis not present

## 2017-06-10 DIAGNOSIS — R1031 Right lower quadrant pain: Secondary | ICD-10-CM | POA: Diagnosis not present

## 2017-06-10 DIAGNOSIS — R0602 Shortness of breath: Secondary | ICD-10-CM | POA: Diagnosis not present

## 2017-06-10 DIAGNOSIS — R911 Solitary pulmonary nodule: Secondary | ICD-10-CM | POA: Diagnosis not present

## 2017-06-10 DIAGNOSIS — S3991XA Unspecified injury of abdomen, initial encounter: Secondary | ICD-10-CM | POA: Diagnosis not present

## 2017-06-10 LAB — COMPREHENSIVE METABOLIC PANEL
ALK PHOS: 92 U/L (ref 38–126)
ALT: 27 U/L (ref 14–54)
ANION GAP: 13 (ref 5–15)
AST: 39 U/L (ref 15–41)
Albumin: 4.4 g/dL (ref 3.5–5.0)
BILIRUBIN TOTAL: 1.2 mg/dL (ref 0.3–1.2)
BUN: 16 mg/dL (ref 6–20)
CALCIUM: 8.4 mg/dL — AB (ref 8.9–10.3)
CO2: 29 mmol/L (ref 22–32)
Chloride: 97 mmol/L — ABNORMAL LOW (ref 101–111)
Creatinine, Ser: 0.92 mg/dL (ref 0.44–1.00)
GFR calc Af Amer: >60 mL/min (ref >60)
GFR, EST NON AFRICAN AMERICAN: 52 mL/min — AB (ref >60)
Glucose, Bld: 99 mg/dL (ref 65–99)
POTASSIUM: 3.6 mmol/L (ref 3.5–5.1)
Sodium: 139 mmol/L (ref 135–145)
TOTAL PROTEIN: 7.7 g/dL (ref 6.5–8.1)

## 2017-06-10 LAB — URINALYSIS, ROUTINE W REFLEX MICROSCOPIC
BACTERIA UA: NONE SEEN
BILIRUBIN URINE: NEGATIVE
GLUCOSE, UA: NEGATIVE mg/dL
HGB URINE DIPSTICK: NEGATIVE
Ketones, ur: NEGATIVE mg/dL
NITRITE: NEGATIVE
PH: 8 (ref 5.0–8.0)
Protein, ur: NEGATIVE mg/dL
SPECIFIC GRAVITY, URINE: 1.005 (ref 1.005–1.030)

## 2017-06-10 LAB — CBC
HEMATOCRIT: 47.1 % — AB (ref 36.0–46.0)
Hemoglobin: 16.2 g/dL — ABNORMAL HIGH (ref 12.0–15.0)
MCH: 32 pg (ref 26.0–34.0)
MCHC: 34.4 g/dL (ref 30.0–36.0)
MCV: 93.1 fL (ref 78.0–100.0)
Platelets: 207 10*3/uL (ref 150–400)
RBC: 5.06 MIL/uL (ref 3.87–5.11)
RDW: 14.9 % (ref 11.5–15.5)
WBC: 9.3 10*3/uL (ref 4.0–10.5)

## 2017-06-10 LAB — I-STAT CHEM 8, ED
BUN: 17 mg/dL (ref 6–20)
CALCIUM ION: 1.03 mmol/L — AB (ref 1.15–1.40)
CREATININE: 0.9 mg/dL (ref 0.44–1.00)
Chloride: 98 mmol/L — ABNORMAL LOW (ref 101–111)
Glucose, Bld: 96 mg/dL (ref 65–99)
HCT: 51 % — ABNORMAL HIGH (ref 36.0–46.0)
Hemoglobin: 17.3 g/dL — ABNORMAL HIGH (ref 12.0–15.0)
Potassium: 3.5 mmol/L (ref 3.5–5.1)
Sodium: 141 mmol/L (ref 135–145)
TCO2: 30 mmol/L (ref 22–32)

## 2017-06-10 LAB — I-STAT TROPONIN, ED: TROPONIN I, POC: 0.04 ng/mL (ref 0.00–0.08)

## 2017-06-10 LAB — I-STAT CG4 LACTIC ACID, ED: LACTIC ACID, VENOUS: 0.96 mmol/L (ref 0.5–1.9)

## 2017-06-10 LAB — PROTIME-INR
INR: 1.53
Prothrombin Time: 18.3 seconds — ABNORMAL HIGH (ref 11.4–15.2)

## 2017-06-10 LAB — SAMPLE TO BLOOD BANK

## 2017-06-10 MED ORDER — IOPAMIDOL (ISOVUE-300) INJECTION 61%
INTRAVENOUS | Status: AC
  Administered 2017-06-11: 100 mL via INTRAVENOUS
  Filled 2017-06-10: qty 100
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
## 2017-06-10 NOTE — ED Notes (Signed)
Pt requesting water is scared of getting dehydrated since she takes diuretics and c/o of nausea

## 2017-06-10 NOTE — ED Notes (Signed)
Will obtain vital signs and EKG after patient goes to the [facility].
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
## 2017-06-11 ENCOUNTER — Encounter (HOSPITAL_COMMUNITY): Payer: Self-pay

## 2017-06-11 ENCOUNTER — Ambulatory Visit (INDEPENDENT_AMBULATORY_CARE_PROVIDER_SITE_OTHER): Payer: Commercial Managed Care - HMO | Admitting: Ophthalmology

## 2017-06-11 DIAGNOSIS — S3991XA Unspecified injury of abdomen, initial encounter: Secondary | ICD-10-CM | POA: Diagnosis not present

## 2017-06-11 DIAGNOSIS — R51 Headache: Secondary | ICD-10-CM | POA: Diagnosis not present

## 2017-06-11 DIAGNOSIS — R079 Chest pain, unspecified: Secondary | ICD-10-CM | POA: Diagnosis not present

## 2017-06-11 DIAGNOSIS — H209 Unspecified iridocyclitis: Secondary | ICD-10-CM | POA: Diagnosis not present

## 2017-06-11 DIAGNOSIS — S199XXA Unspecified injury of neck, initial encounter: Secondary | ICD-10-CM | POA: Diagnosis not present

## 2017-06-11 DIAGNOSIS — R911 Solitary pulmonary nodule: Secondary | ICD-10-CM | POA: Diagnosis not present

## 2017-06-11 MED ORDER — IOPAMIDOL (ISOVUE-300) INJECTION 61%
100.0000 mL | Freq: Once | INTRAVENOUS | Status: AC | PRN
  Administered 2017-06-11: 100 mL via INTRAVENOUS

## 2017-06-12 ENCOUNTER — Other Ambulatory Visit: Payer: Self-pay

## 2017-06-12 MED ORDER — ISOSORBIDE MONONITRATE ER 30 MG PO TB24: 90.0000 mg | ORAL_TABLET | Freq: Every day | ORAL | 1 refills | Status: DC

## 2017-06-12 MED ORDER — POTASSIUM CHLORIDE CRYS ER 10 MEQ PO TBCR: EXTENDED_RELEASE_TABLET | ORAL | 0 refills | Status: DC

## 2017-06-18 DIAGNOSIS — E890 Postprocedural hypothyroidism: Secondary | ICD-10-CM | POA: Diagnosis not present

## 2017-06-18 DIAGNOSIS — Z0000 Encounter for general adult medical examination without abnormal findings: Secondary | ICD-10-CM | POA: Diagnosis not present

## 2017-06-18 DIAGNOSIS — G2581 Restless legs syndrome: Secondary | ICD-10-CM | POA: Diagnosis not present

## 2017-06-18 DIAGNOSIS — M8580 Other specified disorders of bone density and structure, unspecified site: Secondary | ICD-10-CM | POA: Diagnosis not present

## 2017-06-18 DIAGNOSIS — I5032 Chronic diastolic (congestive) heart failure: Secondary | ICD-10-CM | POA: Diagnosis not present

## 2017-06-18 DIAGNOSIS — Z23 Encounter for immunization: Secondary | ICD-10-CM | POA: Diagnosis not present

## 2017-06-18 DIAGNOSIS — I482 Chronic atrial fibrillation: Secondary | ICD-10-CM | POA: Diagnosis not present

## 2017-06-18 DIAGNOSIS — I10 Essential (primary) hypertension: Secondary | ICD-10-CM | POA: Diagnosis not present

## 2017-06-18 DIAGNOSIS — Z85850 Personal history of malignant neoplasm of thyroid: Secondary | ICD-10-CM | POA: Diagnosis not present

## 2017-06-24 DIAGNOSIS — H209 Unspecified iridocyclitis: Secondary | ICD-10-CM | POA: Diagnosis not present

## 2017-06-24 DIAGNOSIS — H40052 Ocular hypertension, left eye: Secondary | ICD-10-CM | POA: Diagnosis not present

## 2017-06-24 DIAGNOSIS — H04123 Dry eye syndrome of bilateral lacrimal glands: Secondary | ICD-10-CM | POA: Diagnosis not present

## 2017-06-26 DIAGNOSIS — N8189 Other female genital prolapse: Secondary | ICD-10-CM | POA: Diagnosis not present

## 2017-06-26 DIAGNOSIS — Z6827 Body mass index (BMI) 27.0-27.9, adult: Secondary | ICD-10-CM | POA: Diagnosis not present

## 2017-06-26 DIAGNOSIS — Z01419 Encounter for gynecological examination (general) (routine) without abnormal findings: Secondary | ICD-10-CM | POA: Diagnosis not present

## 2017-06-26 DIAGNOSIS — N3281 Overactive bladder: Secondary | ICD-10-CM | POA: Diagnosis not present

## 2017-06-27 ENCOUNTER — Ambulatory Visit (INDEPENDENT_AMBULATORY_CARE_PROVIDER_SITE_OTHER): Payer: Medicare HMO | Admitting: Pharmacist

## 2017-06-27 DIAGNOSIS — I482 Chronic atrial fibrillation, unspecified: Secondary | ICD-10-CM

## 2017-06-27 DIAGNOSIS — Z5181 Encounter for therapeutic drug level monitoring: Secondary | ICD-10-CM | POA: Diagnosis not present

## 2017-06-27 LAB — POCT INR: INR: 2.2

## 2017-07-02 DIAGNOSIS — R1013 Epigastric pain: Secondary | ICD-10-CM | POA: Diagnosis not present

## 2017-07-02 DIAGNOSIS — S20211A Contusion of right front wall of thorax, initial encounter: Secondary | ICD-10-CM | POA: Diagnosis not present

## 2017-07-21 ENCOUNTER — Other Ambulatory Visit: Payer: Self-pay | Admitting: Cardiovascular Disease

## 2017-07-22 NOTE — Telephone Encounter (Signed)
Please review for refill, Thanks !  

## 2017-07-23 DIAGNOSIS — N8182 Incompetence or weakening of pubocervical tissue: Secondary | ICD-10-CM | POA: Diagnosis not present

## 2017-08-05 ENCOUNTER — Ambulatory Visit (INDEPENDENT_AMBULATORY_CARE_PROVIDER_SITE_OTHER): Payer: Medicare HMO | Admitting: Pharmacist Clinician (PhC)/ Clinical Pharmacy Specialist

## 2017-08-05 DIAGNOSIS — I482 Chronic atrial fibrillation, unspecified: Secondary | ICD-10-CM

## 2017-08-05 DIAGNOSIS — Z5181 Encounter for therapeutic drug level monitoring: Secondary | ICD-10-CM | POA: Diagnosis not present

## 2017-08-05 DIAGNOSIS — Z7901 Long term (current) use of anticoagulants: Secondary | ICD-10-CM | POA: Diagnosis not present

## 2017-08-05 LAB — POCT INR: INR: 1.8

## 2017-08-05 NOTE — Patient Instructions (Signed)
Description   Take 1.5 tablets today Tuesday Dec 18, then continue taking 1 tablet daily except 1/2 tablet on Sundays and Wednesday. Recheck INR in 3 weeks.     Consider price for Eliquis or Xarelto

## 2017-08-07 DIAGNOSIS — D1801 Hemangioma of skin and subcutaneous tissue: Secondary | ICD-10-CM | POA: Diagnosis not present

## 2017-08-07 DIAGNOSIS — L821 Other seborrheic keratosis: Secondary | ICD-10-CM | POA: Diagnosis not present

## 2017-08-07 DIAGNOSIS — L814 Other melanin hyperpigmentation: Secondary | ICD-10-CM | POA: Diagnosis not present

## 2017-08-07 DIAGNOSIS — D2262 Melanocytic nevi of left upper limb, including shoulder: Secondary | ICD-10-CM | POA: Diagnosis not present

## 2017-08-07 DIAGNOSIS — Z85828 Personal history of other malignant neoplasm of skin: Secondary | ICD-10-CM | POA: Diagnosis not present

## 2017-08-07 DIAGNOSIS — L720 Epidermal cyst: Secondary | ICD-10-CM | POA: Diagnosis not present

## 2017-08-07 DIAGNOSIS — D485 Neoplasm of uncertain behavior of skin: Secondary | ICD-10-CM | POA: Diagnosis not present

## 2017-08-07 DIAGNOSIS — L905 Scar conditions and fibrosis of skin: Secondary | ICD-10-CM | POA: Diagnosis not present

## 2017-08-07 DIAGNOSIS — I788 Other diseases of capillaries: Secondary | ICD-10-CM | POA: Diagnosis not present

## 2017-08-26 ENCOUNTER — Ambulatory Visit (INDEPENDENT_AMBULATORY_CARE_PROVIDER_SITE_OTHER): Payer: Medicare HMO | Admitting: Pharmacist Clinician (PhC)/ Clinical Pharmacy Specialist

## 2017-08-26 DIAGNOSIS — I482 Chronic atrial fibrillation, unspecified: Secondary | ICD-10-CM

## 2017-08-26 DIAGNOSIS — N8182 Incompetence or weakening of pubocervical tissue: Secondary | ICD-10-CM | POA: Diagnosis not present

## 2017-08-26 DIAGNOSIS — Z5181 Encounter for therapeutic drug level monitoring: Secondary | ICD-10-CM | POA: Diagnosis not present

## 2017-08-26 LAB — POCT INR: INR: 2.4

## 2017-08-26 NOTE — Patient Instructions (Signed)
Description   Continue taking 1 tablet daily except 1/2 tablet on Sundays and Wednesday. Recheck INR in 4 weeks.

## 2017-09-04 DIAGNOSIS — H04123 Dry eye syndrome of bilateral lacrimal glands: Secondary | ICD-10-CM | POA: Diagnosis not present

## 2017-09-04 DIAGNOSIS — H401131 Primary open-angle glaucoma, bilateral, mild stage: Secondary | ICD-10-CM | POA: Diagnosis not present

## 2017-09-04 DIAGNOSIS — H1820 Unspecified corneal edema: Secondary | ICD-10-CM | POA: Diagnosis not present

## 2017-09-23 DIAGNOSIS — N8182 Incompetence or weakening of pubocervical tissue: Secondary | ICD-10-CM | POA: Diagnosis not present

## 2017-09-24 ENCOUNTER — Other Ambulatory Visit: Payer: Self-pay | Admitting: Cardiovascular Disease

## 2017-09-24 NOTE — Telephone Encounter (Signed)
Refill Request.  

## 2017-09-26 ENCOUNTER — Ambulatory Visit (INDEPENDENT_AMBULATORY_CARE_PROVIDER_SITE_OTHER): Payer: Medicare HMO | Admitting: Pharmacist Clinician (PhC)/ Clinical Pharmacy Specialist

## 2017-09-26 DIAGNOSIS — Z5181 Encounter for therapeutic drug level monitoring: Secondary | ICD-10-CM | POA: Diagnosis not present

## 2017-09-26 DIAGNOSIS — I482 Chronic atrial fibrillation, unspecified: Secondary | ICD-10-CM

## 2017-09-26 LAB — POCT INR: INR: 2.6

## 2017-09-26 NOTE — Patient Instructions (Signed)
Description   Continue taking 1 tablet daily except 1/2 tablet on Sundays and Wednesday. Recheck INR in 4 weeks.

## 2017-10-13 ENCOUNTER — Encounter (INDEPENDENT_AMBULATORY_CARE_PROVIDER_SITE_OTHER): Payer: Medicare HMO | Admitting: Ophthalmology

## 2017-10-13 DIAGNOSIS — H353112 Nonexudative age-related macular degeneration, right eye, intermediate dry stage: Secondary | ICD-10-CM | POA: Diagnosis not present

## 2017-10-13 DIAGNOSIS — I10 Essential (primary) hypertension: Secondary | ICD-10-CM | POA: Diagnosis not present

## 2017-10-13 DIAGNOSIS — H35342 Macular cyst, hole, or pseudohole, left eye: Secondary | ICD-10-CM | POA: Diagnosis not present

## 2017-10-13 DIAGNOSIS — H35033 Hypertensive retinopathy, bilateral: Secondary | ICD-10-CM | POA: Diagnosis not present

## 2017-10-13 DIAGNOSIS — H43813 Vitreous degeneration, bilateral: Secondary | ICD-10-CM

## 2017-10-15 ENCOUNTER — Telehealth: Payer: Self-pay | Admitting: Cardiology

## 2017-10-15 MED ORDER — WARFARIN SODIUM 5 MG PO TABS: ORAL_TABLET | ORAL | 0 refills | Status: DC

## 2017-10-15 MED ORDER — FUROSEMIDE 20 MG PO TABS: ORAL_TABLET | ORAL | 0 refills | Status: DC

## 2017-10-15 NOTE — Addendum Note (Signed)
Addended by: Rockne Menghini on: 10/15/2017 02:18 PM   Modules accepted: Orders

## 2017-10-15 NOTE — Telephone Encounter (Signed)
°*  STAT* If patient is at the pharmacy, call can be transferred to refill team.   1. Which medications need to be refilled? (please list name of each medication and dose if known) warfarin (COUMADIN) 5 MG tablet  furosemide (LASIX) 20 MG tablet  2. Which pharmacy/location (including street and city if local pharmacy) is medication to be sent to? [facility] [id number]  3. Do they need a 30 day or 90 day supply? [place]

## 2017-10-20 DIAGNOSIS — J040 Acute laryngitis: Secondary | ICD-10-CM | POA: Diagnosis not present

## 2017-10-20 DIAGNOSIS — J069 Acute upper respiratory infection, unspecified: Secondary | ICD-10-CM | POA: Diagnosis not present

## 2017-10-20 DIAGNOSIS — R05 Cough: Secondary | ICD-10-CM | POA: Diagnosis not present

## 2017-10-25 DIAGNOSIS — R05 Cough: Secondary | ICD-10-CM | POA: Diagnosis not present

## 2017-10-25 DIAGNOSIS — J069 Acute upper respiratory infection, unspecified: Secondary | ICD-10-CM | POA: Diagnosis not present

## 2017-10-25 DIAGNOSIS — R062 Wheezing: Secondary | ICD-10-CM | POA: Diagnosis not present

## 2017-11-07 ENCOUNTER — Ambulatory Visit (INDEPENDENT_AMBULATORY_CARE_PROVIDER_SITE_OTHER): Payer: Medicare HMO | Admitting: Pharmacist Clinician (PhC)/ Clinical Pharmacy Specialist

## 2017-11-07 DIAGNOSIS — I482 Chronic atrial fibrillation, unspecified: Secondary | ICD-10-CM

## 2017-11-07 DIAGNOSIS — Z5181 Encounter for therapeutic drug level monitoring: Secondary | ICD-10-CM

## 2017-11-07 DIAGNOSIS — E890 Postprocedural hypothyroidism: Secondary | ICD-10-CM | POA: Diagnosis not present

## 2017-11-07 DIAGNOSIS — Z85850 Personal history of malignant neoplasm of thyroid: Secondary | ICD-10-CM | POA: Diagnosis not present

## 2017-11-07 LAB — POCT INR: INR: 2.6

## 2017-11-07 NOTE — Patient Instructions (Signed)
Description   Continue taking 1 tablet daily except 1/2 tablet on Sundays and Wednesday. Recheck INR in 4 weeks.     Increase furosemide to 3 tablets (60 mg total) twice daily for 3 days, then resume with 2 tablets twice daily.  If your weight does not go down by Monday, PLEASE, call the office and ask to speak with the triage nurse.

## 2017-11-11 DIAGNOSIS — K900 Celiac disease: Secondary | ICD-10-CM | POA: Diagnosis not present

## 2017-11-11 DIAGNOSIS — Z85850 Personal history of malignant neoplasm of thyroid: Secondary | ICD-10-CM | POA: Diagnosis not present

## 2017-11-11 DIAGNOSIS — E890 Postprocedural hypothyroidism: Secondary | ICD-10-CM | POA: Diagnosis not present

## 2017-11-12 DIAGNOSIS — R300 Dysuria: Secondary | ICD-10-CM | POA: Diagnosis not present

## 2017-11-12 DIAGNOSIS — N390 Urinary tract infection, site not specified: Secondary | ICD-10-CM | POA: Diagnosis not present

## 2017-11-12 DIAGNOSIS — N8182 Incompetence or weakening of pubocervical tissue: Secondary | ICD-10-CM | POA: Diagnosis not present

## 2017-11-18 ENCOUNTER — Other Ambulatory Visit: Payer: Self-pay | Admitting: Cardiovascular Disease

## 2017-11-18 NOTE — Telephone Encounter (Signed)
Please review for refill, thanks ! 

## 2017-12-01 ENCOUNTER — Other Ambulatory Visit: Payer: Self-pay | Admitting: Cardiovascular Disease

## 2017-12-02 NOTE — Telephone Encounter (Signed)
Please review for refill, Thanks !  

## 2017-12-05 ENCOUNTER — Other Ambulatory Visit: Payer: Self-pay

## 2017-12-09 ENCOUNTER — Ambulatory Visit (INDEPENDENT_AMBULATORY_CARE_PROVIDER_SITE_OTHER): Payer: Medicare HMO | Admitting: Pharmacist

## 2017-12-09 DIAGNOSIS — I482 Chronic atrial fibrillation, unspecified: Secondary | ICD-10-CM

## 2017-12-09 DIAGNOSIS — Z5181 Encounter for therapeutic drug level monitoring: Secondary | ICD-10-CM | POA: Diagnosis not present

## 2017-12-09 LAB — POCT INR: INR: 2.2

## 2017-12-09 NOTE — Patient Instructions (Signed)
Blood pressure 104/70 ; pulse 84

## 2017-12-10 ENCOUNTER — Telehealth: Payer: Self-pay | Admitting: Cardiovascular Disease

## 2017-12-10 NOTE — Telephone Encounter (Signed)
Spoke with pt, for the last couple weeks she has had bp readings from 160 one day and then 90's the next. She is also having to take extra furosemide due to weight gain every 2-3- days. She reports decrease in energy doing chores around the house that usually do not bother her. She is concerned and when she saw the pharm md in the [facility] yesterday and they told her she needed to be seen. She has a follow up with dr Oval Linsey in may but would like to be seen sooner. Appointment scheduled tomorrow with extender. She will bring her bp cuff to that appt and also her bp readings.

## 2017-12-10 NOTE — Telephone Encounter (Signed)
Pt c/o BP issue:  1. What are your last 5 BP readings?  Today       163/83 4.23.19       92/71 2. Are you having any other symptoms (ex. Dizziness, headache, blurred vision, passed out)no 3. What is your medication issue? Pt isn't steady on her feet

## 2017-12-11 ENCOUNTER — Ambulatory Visit: Payer: Medicare HMO | Admitting: Adult Health

## 2017-12-11 ENCOUNTER — Encounter: Payer: Self-pay | Admitting: Adult Health

## 2017-12-11 VITALS — BP 128/78 | HR 63 | Ht 60.0 in | Wt 147.0 lb

## 2017-12-11 DIAGNOSIS — I5032 Chronic diastolic (congestive) heart failure: Secondary | ICD-10-CM | POA: Diagnosis not present

## 2017-12-11 DIAGNOSIS — I10 Essential (primary) hypertension: Secondary | ICD-10-CM | POA: Diagnosis not present

## 2017-12-11 DIAGNOSIS — I482 Chronic atrial fibrillation, unspecified: Secondary | ICD-10-CM

## 2017-12-11 DIAGNOSIS — Z79899 Other long term (current) drug therapy: Secondary | ICD-10-CM

## 2017-12-11 LAB — CBC
Hematocrit: 42.3 % (ref 34.0–46.6)
Hemoglobin: 14 g/dL (ref 11.1–15.9)
MCH: 31.3 pg (ref 26.6–33.0)
MCHC: 33.1 g/dL (ref 31.5–35.7)
MCV: 94 fL (ref 79–97)
PLATELETS: 191 10*3/uL (ref 150–379)
RBC: 4.48 x10E6/uL (ref 3.77–5.28)
RDW: 14.8 % (ref 12.3–15.4)
WBC: 5.3 10*3/uL (ref 3.4–10.8)

## 2017-12-11 LAB — BASIC METABOLIC PANEL
BUN / CREAT RATIO: 15 (ref 12–28)
BUN: 12 mg/dL (ref 10–36)
CHLORIDE: 97 mmol/L (ref 96–106)
CO2: 29 mmol/L (ref 20–29)
Calcium: 9 mg/dL (ref 8.7–10.3)
Creatinine, Ser: 0.82 mg/dL (ref 0.57–1.00)
GFR calc non Af Amer: 62 mL/min/{1.73_m2} (ref >59)
GFR, EST AFRICAN AMERICAN: 71 mL/min/{1.73_m2} (ref >59)
Glucose: 94 mg/dL (ref 65–99)
Potassium: 4.5 mmol/L (ref 3.5–5.2)
Sodium: 139 mmol/L (ref 134–144)

## 2017-12-11 MED ORDER — FUROSEMIDE 20 MG PO TABS: 60.0000 mg | ORAL_TABLET | Freq: Two times a day (BID) | ORAL | 3 refills | Status: DC

## 2017-12-11 NOTE — Patient Instructions (Signed)
Medication Instructions:  INCREASE LASIX TO 60MG (3TAB) IN THE AM AND 40MG  (2 TAB) IN THE PM  If you need a refill on your cardiac medications before your next appointment, please call your pharmacy.  Labwork: BMET TODAY AND IN 1 WEEK-DISCUSS THIS WITH THN HERE IN OUR OFFICE AT [facility]  Take the provided lab slips with you to the lab for your blood draw.   You will NOT need to fast   Special Instructions: MAKE SURE YOU CHECK BATTERIES IN SCALE AND BP CUFF-DISCUSS NEW WITH THN ALSO  Follow-Up: Your physician wants you to follow-up in: [place]   Thank you for choosing [facility]!!

## 2017-12-11 NOTE — Progress Notes (Signed)
Cardiology Office Note   Date:  12/11/2017   ID:  Emmamae, Mcnamara [birth year], MRN [id number]  PCP:  Dineen Kid, MD  Cardiologist:  Dr.[place]   No chief complaint on file.    History of Present Illness: Kristen Whitehead is a 84 y.o. female who presents for annual follow-up with known history of hypertension, constant atrial fibrillation on Coumadin, and diastolic CHF.  Most recent echocardiogram March 2016 showed ejection fraction of 60% to 65% with mild AR and MR.  She was last seen in the office by Kerin Ransom, [place] on 11/18/2016.  At that time she was without any cardiac complaints.  The patient did call our office on 12/10/2017 with complaints of elevated blood pressures, which were labile.  She has been taking extra furosemide due to some weight gain every 2 to 3 days, she also reported decrease in energy doing chores around her home usually did not cause her to be symptomatic.  The patient has been diligently checking her blood pressure and her weight.  She has not been adhering to a low-sodium diet will have rapid weight fluctuations.  When she eats out at [place] or [place] she will gain anywhere from 4 to 6 pounds by the following day.  When this happens.  She takes an extra dose of Lasix.  She is currently on 40 mg twice daily and will additionally take another 40 mg in the morning to help with her fluid.  Usually by the following day she is down 3 pounds and is feeling better.  However weight continues to go back up when she eats.  And 84 years old she does not do a lot of cooking and therefore she will eat out fairly often.  She does try to do some cooking at home and eat healthy.  She is also complaining that she does not remember things.  It is hard for her to remember when she is taken her medications are not.  She has to write down each day if she took it or not after she initially takes them in the morning and also in the afternoon.  She does not have any family members  nearby that are able to assist her.  She has a daughter who is an invalid in a wheelchair and another daughter who is not local.  Past Medical History:  Diagnosis Date  . Bladder prolapse, female, acquired   . Cancer ([place])    thyroid  . Chest pain    no known ischemic heart disease; negative Myoview July 2013. EF 75% with no ischemia.   . Chronic anticoagulation   . Chronic atrial fibrillation (HCC)    managed with rate control and coumadin  . Chronic diastolic heart failure ([place])   . GERD (gastroesophageal reflux disease)   . Heart disease   . History of congenital mitral regurgitation    mild  . History of mitral valve prolapse 06/15/2008   a. echo 1/14: mild LVH, EF 60-65%, mild MR, mild to mod BAE, PASP 35  . Hypercholesterolemia   . Hypertension   . Hypothyroidism     Past Surgical History:  Procedure Laterality Date  . ABDOMINAL HYSTERECTOMY    . CATARACT EXTRACTION    . CHOLECYSTECTOMY    . Thyroidectomy       Current Outpatient Medications  Medication Sig Dispense Refill  . atenolol (TENORMIN) 25 MG tablet Take 25 mg by mouth 2 (two) times daily.    . beta carotene w/minerals (OCUVITE)  tablet Take 1 tablet by mouth daily.    . calcium carbonate (OS-CAL - DOSED IN MG OF ELEMENTAL CALCIUM) 1250 MG tablet Take 1 tablet by mouth 2 (two) times daily.     . Calcium Polycarbophil (FIBER-CAPS PO) Take 5 capsules by mouth daily. [facility] OTC fiber capsules    . Cholecalciferol (VITAMIN D) 2000 UNITS tablet Take 2,000 Units by mouth daily.     Marland Kitchen estradiol (ESTRACE VAGINAL) 0.1 MG/GM vaginal cream Place 2 g vaginally once a week.     . furosemide (LASIX) 20 MG tablet Take 3 tablets (60 mg total) by mouth 2 (two) times daily. TAKE 3 TAB(60MG) IN THE AM AND 2 TABLETS (40 MG) IN THE PM 120 tablet 3  . isosorbide mononitrate (IMDUR) 30 MG 24 hr tablet Take 3 tablets (90 mg total) by mouth daily. 90 tablet 1  . latanoprost (XALATAN) 0.005 % ophthalmic solution Place 1 drop into  the left eye at bedtime.     Marland Kitchen levothyroxine (SYNTHROID, LEVOTHROID) 100 MCG tablet Take 100 mcg by mouth daily. Alternating days with Levothyroxine sodium 19mg.    . Magnesium 500 MG TABS Take 800 mg by mouth at bedtime.     . nitroGLYCERIN (NITROSTAT) 0.4 MG SL tablet DISSOLVE 1 TABLET UNDER THE TONGUE EVERY 5 MINUTES FOR 3 DOSES AS NEEDED  FOR  CHEST  PAIN 50 tablet 2  . potassium chloride (K-DUR,KLOR-CON) 10 MEQ tablet TAKE 2 TABLETS EVERY DAY 180 tablet 2  . pramipexole (MIRAPEX) 0.125 MG tablet TAKE 1 TABLET EVERY EVENING 90 tablet 3  . temazepam (RESTORIL) 15 MG capsule Take 15 mg by mouth at bedtime.    . vitamin C (ASCORBIC ACID) 500 MG tablet Take 500 mg by mouth daily.    .Marland Kitchenwarfarin (COUMADIN) 5 MG tablet TAKE 1/2 TO 1 TABLET DAILY AS DIRECTED BY COUMADIN CLINIC 90 tablet 0   No current facility-administered medications for this visit.     Allergies:   Azithromycin; Compazine [prochlorperazine edisylate]; Demerol; Dolophine [methadone]; Ebastine; Erythromycin; Ibuprofen; Statins; Tetanus toxoids; and Tetracyclines & related    Social History:  The patient  reports that she has never smoked. She has never used smokeless tobacco. She reports that she does not drink alcohol or use drugs.   Family History:  The patient's family history includes Stroke in her father and mother.    ROS: All other systems are reviewed and negative. Unless otherwise mentioned in H&P    PHYSICAL EXAM: VS:  BP 128/78   Pulse 63   Ht 5' (1.524 m)   Wt 147 lb (66.7 kg)   BMI 28.71 kg/m  , BMI Body mass index is 28.71 kg/m. GEN: Well nourished, well developed, in no acute distress looking younger than statrd age.  HEENT: normal  Neck: no JVD, carotid bruits, or masses Cardiac: IRRR; no murmurs, rubs, or gallops,no edema  Respiratory:  clear to auscultation bilaterally, normal work of breathing GI: soft, nontender, nondistended, + BS MS: no deformity or atrophy  Skin: warm and dry, no  rash Neuro:  Strength and sensation are intact Psych: euthymic mood, full affect   EKG: Atrial fibrillation, heart rate of 63 bpm.  Recent Labs: 06/10/2017: ALT 27; BUN 17; Creatinine, Ser 0.90; Hemoglobin 17.3; Platelets 207; Potassium 3.5; Sodium 141    Lipid Panel    Component Value Date/Time   CHOL 211 (H) 11/04/2012 1524   TRIG 201.0 (H) 11/04/2012 1524   HDL 66.80 11/04/2012 1524   CHOLHDL 3 11/04/2012  1524   VLDL 40.2 (H) 11/04/2012 1524   LDLCALC 109 (H) 04/16/2012 1043   LDLDIRECT 98.8 11/04/2012 1524      Wt Readings from Last 3 Encounters:  12/11/17 147 lb (66.7 kg)  06/10/17 132 lb (59.9 kg)  01/22/17 137 lb (62.1 kg)      Other studies Reviewed: - Left ventricle: The cavity size was normal. Wall thickness was normal. Systolic function was normal. The estimated ejection fraction was in the range of 60% to 65%. - Aortic valve: There was mild regurgitation. - Mitral valve: Calcified annulus. Mildly thickened leaflets . There was mild regurgitation. - Left atrium: The atrium was mildly dilated. - Right atrium: The atrium was mildly dilated.   ASSESSMENT AND PLAN:  1.  Atrial fibrillation: Heart rate is currently well controlled on her medication regimen.  She is followed in our [place] office Coumadin clinic.  No changes in regimen.  2.  Hypertension: The patient's blood pressures have been labile according to her home blood pressure machine.  It does not correlate with our current readings.  I have also explained to her that it may be difficult to have completely accurate blood pressure recordings in the setting of atrial fibrillation.  Will be no changes in her medication regimen at this time concerning blood pressure control.  She will continue atenolol 25 mg twice daily.  3.  Chronic diastolic heart failure: The patient states she can tell when she is volume overloaded as her abdomen is very full and tight but she does not have any peripheral  edema.  When she stands on the scale she is noticed that she is gained 4 5 pounds.  She has been following Dr. Blenda Mounts advisement to take an additional Lasix if she does gain weight.  She is having to take extra Lasix 2-3 times a week now.  My plan is to increase her morning Lasix to 60 mg, and continue 40 mg in the p.m.  I am going to order a baseline BMET to evaluate her current status and that a follow-up be met after increasing her dose.  4.  Difficulty with memory and need of medical maintenance at home.  I am going to have [facility] see her for recommendations on assistance options for medications and BP along with diet recommendations and reinforcement.    Current medicines are reviewed at length with the patient today.    Labs/ tests ordered today include: BMET X 2.  Phill Myron. [place] Pugh, ANP, AACC   12/11/2017 12:21 PM    [facility] [address], [place], [place] [address] Phone: [phone]; Fax: [phone]

## 2017-12-12 ENCOUNTER — Telehealth: Payer: Self-pay | Admitting: Cardiovascular Disease

## 2017-12-12 ENCOUNTER — Other Ambulatory Visit: Payer: Self-pay

## 2017-12-12 MED ORDER — FUROSEMIDE 20 MG PO TABS: ORAL_TABLET | ORAL | 3 refills | Status: DC

## 2017-12-12 MED ORDER — ISOSORBIDE MONONITRATE ER 30 MG PO TB24: 90.0000 mg | ORAL_TABLET | Freq: Every day | ORAL | 1 refills | Status: DC

## 2017-12-12 NOTE — Telephone Encounter (Signed)
New message     *STAT* If patient is at the pharmacy, call can be transferred to refill team.   1. Which medications need to be refilled? (please list name of each medication and dose if known) isosorbide mononitrate (IMDUR) 30 MG 24 hr tablet  2. Which pharmacy/location (including street and city if local pharmacy) is medication to be sent to? [place], [place]  3. Do they need a 30 day or 90 day supply? [place]

## 2017-12-15 DIAGNOSIS — N8182 Incompetence or weakening of pubocervical tissue: Secondary | ICD-10-CM | POA: Diagnosis not present

## 2017-12-16 ENCOUNTER — Other Ambulatory Visit: Payer: Self-pay | Admitting: *Deleted

## 2017-12-16 ENCOUNTER — Telehealth: Payer: Self-pay | Admitting: Cardiovascular Disease

## 2017-12-16 DIAGNOSIS — N3281 Overactive bladder: Secondary | ICD-10-CM | POA: Diagnosis not present

## 2017-12-16 DIAGNOSIS — N8111 Cystocele, midline: Secondary | ICD-10-CM | POA: Diagnosis not present

## 2017-12-16 DIAGNOSIS — N8189 Other female genital prolapse: Secondary | ICD-10-CM | POA: Diagnosis not present

## 2017-12-16 MED ORDER — ISOSORBIDE MONONITRATE ER 30 MG PO TB24: 90.0000 mg | ORAL_TABLET | Freq: Every day | ORAL | 3 refills | Status: DC

## 2017-12-16 MED ORDER — FUROSEMIDE 20 MG PO TABS: ORAL_TABLET | ORAL | 3 refills | Status: DC

## 2017-12-16 NOTE — Patient Outreach (Signed)
[facility]) Care Management  12/16/2017  Kristen Whitehead Feb 11, 1923 [id number]  Telephone screening call  Referral received 12/11/17 Referral source: [facility]- Cardiovascular Division  Referral reason : Heart Failure,Discuss medication and other services.  Insurance : [employer] PCP: Kevin Via, [employer] Cardiologist : [name]  Placed call to patient, explained purpose of the call, HIPAA verified.  Patient reports feeling pretty good on today.   Social Patient reports she lives at home alone, she is able to drive short distances in her neighborhood. Patient discussed she has a daughter in the area but she has limited ability to assist her due to medications and her son visits infrequently.  Patient shared she has very supportive neighbors that help her with errand and grocery shopping, as well as when she gets lonesome she is able to call them just to talk.  Patient attends her church regularly and has assistance with rides to services if needed.  Patient discussed turning off her cable so that she can use that money to help hire someone to come and help her cleaning, " I don't watch TV that much".   Conditions  Patient her current medical conditions of Heart failure reports weighing herself daily and sometimes her weight can go to 2 to 3 pounds in a day she will take a extra fluid pill and weigh will be down the next day. Patient also questions accuracy and plans to replace battery. Patient discussed she doesn't usually have swelling in her legs. Patient discussed not being sure why weight suddenly builds up, reports she used to eat [place] food but not like she used to. Patient discussed having high blood pressure and monitors readings daily as well has checking oxygen saturation, she has not had opportunity to weigh and check blood pressure this morning yet.  Patient reports history of having atrial fib, mitral valve prolapse.  Patient  discussed visit to MD regarding the pessary she has been wearing for 10 years for prolapsed uterus, it was recommended that it be  removed at that visit and she was placed on estrogen for 2 weeks and then she will have return office visit.She states there was no infection  noted at visit.   Medications  .  Patient states no cost concerns, gets medication at [facility] mail order. Patient discussed she fills her pill organizer every 2 weeks, sometimes she forgets to put a pill in but she usually catches it. Patient would like information about what each pill is for.   Consent  Explained and offered [facility] care management services, patient will benefit from community care nurse visit for heart failure education and management , she would also benefit from pharmacy referral regarding questions/information on her medication  Patient is eligible and agreeable to [facility] care management services.  Plan.  Will mark case as active Will place [facility] community care coordinator for Heart failure education and management prefers visit in home.  Will place pharmacy referral for patient request on education and information on medications.    Joylene Draft, RN,  Management Coordinator  [phone]- Mobile [phone]- Toll Free Main Office

## 2017-12-16 NOTE — Telephone Encounter (Signed)
New Message:       Pt states she has had a 3lbs and is wondering should reduce her fluid pill.

## 2017-12-16 NOTE — Telephone Encounter (Signed)
PT CALLED  AND LOST  4 LBS  OVER NIGHT FEELS FINE WAS WANDERING IF NEEDS TO CONTINUE WITH FUROSEMIDE 20 MG 3 TABS IN AM AND 2 TABS IN PM  ENCOURAGED PT TO CONTINUE.PT WILL CALL IF DEVELOPS ANY SYMPTOMS OF DIZZINESS , LIGHTHEADEDNESS, OR LOW B/P  .  WILL FORWARD TO Jory Sims  NP FOR REVIEW  .Adonis Housekeeper

## 2017-12-18 ENCOUNTER — Telehealth: Payer: Self-pay | Admitting: Cardiovascular Disease

## 2017-12-18 ENCOUNTER — Other Ambulatory Visit: Payer: Self-pay | Admitting: Pharmacist

## 2017-12-18 NOTE — Telephone Encounter (Signed)
Coleen /[facility] is calling:   Pt is complaining of chest pressure with weight gain.. Need to know if pt need to come in for BMET per AVS. Pt can't get rings off and weight today is 136. Please clarify any treatment recommendations.

## 2017-12-18 NOTE — Patient Outreach (Signed)
[facility]) Care Management  12/18/2017  Kristen Whitehead Jan 08, 1923 [id number]  84 year old female referred to [facility] [place] [facility] by [facility] cardiovascular division for Heart Failure and medication services.  [place] services requested for medication management. Per cardiology notes on 12/11/17, patient complains of memory loss and not remembering if she has taken medications.   PMHx includes, but not limited to, atrial fibrillation on chronic coumadin, chronic diastolic heart failure (EF 60-65% 3/'16), hypothyroidism, HTN, HLD.   Subjective:  Successful call placed to Kristen Whitehead today.  HIPAA identifiers verified. Kristen Whitehead is agreeable to a medication reconciliation.  She reports that she does have trouble remembering at times if she has taken her medications and has started writing them down as a list each day after she takes her doses.  She reports this list has helped her not to double up or forget any doses.  She also fills her pillboxes every 2 weeks and is not interested in having them filled every 4 weeks as this would take her too much time.  She complains of weight gain recently and uncomfortable chest pressure and trouble breathing due to fluid.  She reports she cannot remove her rings due to swelling in her hands.  She reports she does not weigh herself consistently at the same time each day because she has no daily routine right now.  She reports her weights this week have been as follows: 137 --> 133.8 --> 136.4 --> 136.6lb (baseline 131-138lb).  She also reports having trouble with replacing her batteries in the blood pressure machine.  She reports that she was told to come back for repeat labs this week by NP Leonia Reader but cardiology office staff told her to wait until next appointment in 2 weeks.    Objective:  Medications Reviewed Today    Reviewed by Rudean Haskell, RPH (Pharmacist) on 12/18/17 at 1118  Med List Status: <None>  Medication Order  Taking? Sig Documenting Provider Last Dose Status Informant  atenolol (TENORMIN) 25 MG tablet [id number] Yes Take 25 mg by mouth 2 (two) times daily. [provider] Taking Active   calcium carbonate (OS-CAL - DOSED IN MG OF ELEMENTAL CALCIUM) 1250 MG tablet [id number] Yes Take 1 tablet by mouth 2 (two) times daily.  [provider] Taking Active Self  Cholecalciferol (VITAMIN D) 2000 UNITS tablet [id number] Yes Take 2,000 Units by mouth daily.  [provider] Taking Active Self  cycloSPORINE (RESTASIS) 0.05 % ophthalmic emulsion [id number] Yes Place 1 drop into both eyes 2 (two) times daily. [provider] Taking Active   dorzolamide (TRUSOPT) 2 % ophthalmic solution [id number] Yes Place 1 drop into the left eye 2 (two) times daily. [provider] Taking Active   estradiol (ESTRACE VAGINAL) 0.1 MG/GM vaginal cream [id number] Yes Place 2 g vaginally once a week.  [provider] Taking Active Self           Med Note Dorrene German, NICOLE L   Mon Aug 01, 2014 11:04 AM) .   Fiber POWD [id number] Yes Take 1 Dose by mouth daily. [provider] Taking Active   furosemide (LASIX) 20 MG tablet [id number] Yes TAKE 3 TAB(60MG ) IN THE AM AND 2 TABLETS (40 MG) IN THE PM [name], MD Taking Active   isosorbide mononitrate (IMDUR) 30 MG 24 hr tablet [id number] Yes Take 3 tablets (90 mg total) by mouth daily. [name], MD Taking Active   latanoprost (XALATAN) 0.005 % ophthalmic solution [id number]  Yes Place 1 drop into both eyes at bedtime.  [provider] Taking Active Self  levothyroxine (SYNTHROID, LEVOTHROID) 100 MCG tablet [id number] Yes Take 100 mcg by mouth daily.  [provider] Taking Active            Med Note Olena Heckle, MICHELE   Mon Nov 18, 2016 10:47 AM)    Magnesium 500 MG TABS [id number]  Take 800 mg by mouth at bedtime.  [provider]  Active Self  Multiple Vitamins-Minerals (PRESERVISION AREDS 2 PO)  [id number] Yes Take 2 capsules by mouth. [provider] Taking Active Self  nitroGLYCERIN (NITROSTAT) 0.4 MG SL tablet [id number] Yes DISSOLVE 1 TABLET UNDER THE TONGUE EVERY 5 MINUTES FOR 3 DOSES AS NEEDED  FOR  CHEST  PAIN [name], MD Taking Active   potassium chloride (K-DUR,KLOR-CON) 10 MEQ tablet [id number] Yes TAKE 2 TABLETS EVERY DAY [name], MD Taking Active   pramipexole (MIRAPEX) 0.125 MG tablet [id number] Yes TAKE 1 TABLET EVERY EVENING Star Age, MD Taking Active   temazepam (RESTORIL) 15 MG capsule [id number] Yes Take 15 mg by mouth at bedtime. [provider] Taking Active Self  vitamin C (ASCORBIC ACID) 500 MG tablet [id number] Yes Take 500 mg by mouth daily. [provider] Taking Active Self  warfarin (COUMADIN) 5 MG tablet [id number] Yes TAKE 1/2 TO 1 TABLET DAILY AS DIRECTED BY COUMADIN CLINIC [name], MD Taking Active          Assessment:  Drugs sorted by system:  Neurologic/Psychologic:temazepam  Cardiovascular: atenolol, furosemide, isosorbide, NTG PRN, warfarin  Pulmonary/Allergy:  Gastrointestinal: fiber  Endocrine: levothyroxine  Renal:  Topical: cyclosporine, dorzolamide, latanoprost, estradiol vaginal cream  Pain:  Vitamins/Minerals: calcium carbonate, cholecalciferol, magnesium, MVI, potassium, vitamin C  Infectious Diseases:  Miscellaneous:pramipexole  Duplications in therapy:  Gaps in therapy:  Medications to avoid in the elderly:  Temazepam: This drug is identified in the Beers Criteria as a potentially inappropriate medication to be avoided in patients 65 years and older (independent of diagnosis or condition) due to increased risk of impaired cognition, delirium, falls, fractures, and motor vehicle accidents with benzodiazepine use.  Drug interactions: none Other issues noted:  Magnesium: Patient taking 500-1000mg  magnesium daily which is over the daily recommended dose.  Consider checking  Magnesium level periodically.   Heart Failure management: Patient with recently increased dose of furosemide however she reports continued symptoms of chest pressure, uncomfortable breathing, hand swelling, and flank pain.  She has follow-up appointment with cardiologist in 2 weeks however would like to have recommendations sooner due to symptoms.  We reviewed to check weights daily at the same time daily with similar clothing.    I will alert cardiology office and see if there are any recommendations for patient.   Blood pressure machine:  I recommended that patient take machine into local pharmacy for assistance with replacing battery if needed prior to [facility] RN home visit.  Patient voiced understanding.    Labs: Per last AVS instructions by cardiology office, patient to return to office this week for repeat BMET.  Patient verbally told by front staff to wait until next appointment.    I will clarify this with office.   Plan: Message left with Dr. Blenda Mounts office regarding issues above.  I will also route my note to provider.  I will await call back with any recommendations for patient.   Ralene Bathe, PharmD, [place] [phone]

## 2017-12-18 NOTE — Telephone Encounter (Signed)
Left message for [facility] with [facility] that I will call.   Spoke with pt she is feeling fine with occasional SOB with getting in bed and lying down at night but it tends to go aware after a while. Pt was cleaning in her house today and felt fine. She did mention that she didn't relaize she needed to come back this week for blood work so she will come in tomorrow 5/3 to get a repeat BMET. She did mention that she doesn't not feel she is going to the bathroom enough for the amount of Lasix she is taking. Pt informed I would forward to provider to review and we will get back to her with the recommendations. Pt verbalized understanding, no additional questions at this time.

## 2017-12-19 ENCOUNTER — Other Ambulatory Visit: Payer: Self-pay | Admitting: *Deleted

## 2017-12-19 DIAGNOSIS — Z79899 Other long term (current) drug therapy: Secondary | ICD-10-CM

## 2017-12-19 NOTE — Telephone Encounter (Signed)
Since this call documentation, Bernardo Heater, RN has spoken with patient (see phone note 5/2). This encounter will be closed.

## 2017-12-19 NOTE — Patient Outreach (Signed)
[facility]) Care Management  12/19/2017  MAKAILA WINDLE Apr 23, 1923 [id number]   Referral received requesting assistance with heart failure management.  Per chart, she also has history of atrial fibrillation, hypertension, mitral regurgitation, and hypothyroidism.  Call placed to member to introduce self [facility] services already accepted).  No answer, HIPAA compliant voice message left.  Unsuccessful outreach letter sent, will follow up within the next 4 business days.  Valente David, [place], MSN [place] [phone]

## 2017-12-20 LAB — BASIC METABOLIC PANEL
BUN / CREAT RATIO: 11 — AB (ref 12–28)
BUN: 9 mg/dL — AB (ref 10–36)
CO2: 27 mmol/L (ref 20–29)
CREATININE: 0.83 mg/dL (ref 0.57–1.00)
Calcium: 8.7 mg/dL (ref 8.7–10.3)
Chloride: 98 mmol/L (ref 96–106)
GFR calc Af Amer: 70 mL/min/{1.73_m2} (ref >59)
GFR, EST NON AFRICAN AMERICAN: 61 mL/min/{1.73_m2} (ref >59)
GLUCOSE: 85 mg/dL (ref 65–99)
Potassium: 4.3 mmol/L (ref 3.5–5.2)
Sodium: 140 mmol/L (ref 134–144)

## 2017-12-21 NOTE — Telephone Encounter (Signed)
I have reviewed her most recent labs from 12/19/2017. They are normal. I have referred her to [facility] for home evaluation and need for assistance with remembering her medications. As long as she is doing well, will not need to see her sooner. She will need to keep weighing herself and take the higher doses of lasix as prescribed on last visit, but if she continues weight gain will need to have appt.

## 2017-12-22 ENCOUNTER — Telehealth: Payer: Self-pay

## 2017-12-22 DIAGNOSIS — E890 Postprocedural hypothyroidism: Secondary | ICD-10-CM | POA: Diagnosis not present

## 2017-12-22 DIAGNOSIS — Z79899 Other long term (current) drug therapy: Secondary | ICD-10-CM

## 2017-12-22 NOTE — Telephone Encounter (Signed)
Lendon Colonel, NP  Waylan Rocher, LPN She will need follow up BMET just prior to her two week appointment with [place]. Thank you.  TRIED TO CALL PT, PHONE JUST RINGS BEEPS AND THEN HANGS UP. WILL TRY AGAIN LATER

## 2017-12-23 ENCOUNTER — Telehealth: Payer: Self-pay | Admitting: Cardiovascular Disease

## 2017-12-23 NOTE — Telephone Encounter (Signed)
New Message:      Pt is calling to see if she needs to come back and get labs due to someone calling on yesterday. Pt is not sure if it is our office or another office calling for her to come back and due labs. Pt states she came on 5/3 for labs.

## 2017-12-23 NOTE — Telephone Encounter (Signed)
Spoke with pt who reports she is calling to see if she need to have lab work done again as she has already done it on 5/3. Pt report she spoke with someone this morning but still unsure. Routed to Dr. Oval Linsey and Jory Sims nurse for clarification.

## 2017-12-23 NOTE — Telephone Encounter (Signed)
Thank you :)

## 2017-12-23 NOTE — Telephone Encounter (Signed)
Advised patient of lab results and to just keep follow up next week with Dr Oval Linsey

## 2017-12-23 NOTE — Telephone Encounter (Signed)
-----   Message from [name], MD sent at 12/22/2017  5:34 PM EDT ----- Normal kidney function and electrolytes.

## 2017-12-23 NOTE — Telephone Encounter (Signed)
D/W PT SHE STATES THAT SHE HAD LAB DRAW YESTERDAY ALREADY, WILL DISCUSS AT APPT

## 2017-12-25 ENCOUNTER — Other Ambulatory Visit: Payer: Self-pay | Admitting: *Deleted

## 2017-12-25 NOTE — Patient Outreach (Signed)
[facility]) Care Management  12/25/2017  Kristen Whitehead 24-Mar-1923 [id number]   2nd attempt made to contact member to introduce self as assigned care manager and schedule home visit.  She report she is doing "pretty good."  State she is in need of assistance with managing her heart failure, has follow up appointment and labs scheduled for next week.  Denies any urgent concerns, home visit scheduled for within the next 2 weeks.  Valente David, [place], MSN [place] [phone]

## 2017-12-30 ENCOUNTER — Encounter: Payer: Self-pay | Admitting: Cardiovascular Disease

## 2017-12-30 ENCOUNTER — Ambulatory Visit: Payer: Medicare HMO | Admitting: Cardiovascular Disease

## 2017-12-30 ENCOUNTER — Ambulatory Visit (INDEPENDENT_AMBULATORY_CARE_PROVIDER_SITE_OTHER): Payer: Medicare HMO | Admitting: Pharmacist Clinician (PhC)/ Clinical Pharmacy Specialist

## 2017-12-30 VITALS — BP 146/83 | HR 69 | Ht 60.0 in | Wt 139.6 lb

## 2017-12-30 DIAGNOSIS — R0602 Shortness of breath: Secondary | ICD-10-CM | POA: Diagnosis not present

## 2017-12-30 DIAGNOSIS — I5032 Chronic diastolic (congestive) heart failure: Secondary | ICD-10-CM | POA: Diagnosis not present

## 2017-12-30 DIAGNOSIS — I482 Chronic atrial fibrillation, unspecified: Secondary | ICD-10-CM

## 2017-12-30 DIAGNOSIS — Z5181 Encounter for therapeutic drug level monitoring: Secondary | ICD-10-CM | POA: Diagnosis not present

## 2017-12-30 DIAGNOSIS — I10 Essential (primary) hypertension: Secondary | ICD-10-CM

## 2017-12-30 DIAGNOSIS — I5033 Acute on chronic diastolic (congestive) heart failure: Secondary | ICD-10-CM

## 2017-12-30 LAB — POCT INR: INR: 2.9

## 2017-12-30 NOTE — Progress Notes (Signed)
Cardiology Office Note   Date:  12/30/2017   ID:  Kristen, Whitehead [birth year], MRN [id number]  PCP:  Kristen Kid, MD  Cardiologist:   Kristen Latch, MD   No chief complaint on file.     History of Present Illness: Kristen Whitehead is a 84 y.o. female with chronic atrial fibrillation, hypertension, hyperlipidemia, mild MR and AR and chronic diastolic heart failure who presents for follow up.  Kristen Whitehead was previously a patient of Kristen Whitehead.  She last saw him 08/2015 for follow-up on frequent episodes of chest pain. She had been referred for stress testing but declined, as she has not tolerated Lexiscan well in the past. Troponin was checked and was negative. This is been a chronic problem so further evaluation was deferred.  Her stress test in 2013 was negative for ischemia.  She reports having "heart attacks" that occur when she overexerts herself.  When this happens she gets pain across her chest that is associated with shortness of breath.  She denies lower extremity edema, orthopnea or PND.  She watched a documentary on broken heart syndrome and thinks this must be what happens to her.  When she gets the pain she prays for God to take it away and eventually the pain subsides.  There is no associated nausea or diaphoresis.  Imdur was increased to 90 mg daily on 10/2015.  Since then she had an episode of heart failure 01/2016 that responded to increased lasix.    At her last appointment Kristen Whitehead's atenolol was switched to metoprolol given that atenolol was on national back order.  She has since started back on atenolol.  [name] has been feeling short of breath with physical activity.  She notices it when climbing stairs or when lying in bed.  Her weight has also been increasing.  She saw Kristen Flake, DNP, on 4/25.  Lasix was increased to 60 mg in the morning and 40 in the afternoon.  She has not noticed much change in her symptoms since that time.  She has  intermittent output to Lasix.  Her weight fluctuates up and down between 131 and 139.  She continues to be very physically active.  She is watched her 2 great grandchildren for 12 hours 2 days ago.  She has not experienced any chest pain or pressure.  She also does not notice any lower extremity edema.  Her blood pressure at home is been mostly in the 130s.  It very rarely rises to the 160s.  She does not have much salt in her diet and does not eat out much.  Past Medical History:  Diagnosis Date  . Bladder prolapse, female, acquired   . Cancer [place])    thyroid  . Chest pain    no known ischemic heart disease; negative Myoview July 2013. EF 75% with no ischemia.   . Chronic anticoagulation   . Chronic atrial fibrillation (HCC)    managed with rate control and coumadin  . Chronic diastolic heart failure ([place])   . GERD (gastroesophageal reflux disease)   . Heart disease   . History of congenital mitral regurgitation    mild  . History of mitral valve prolapse 06/15/2008   a. echo 1/14: mild LVH, EF 60-65%, mild MR, mild to mod BAE, PASP 35  . Hypercholesterolemia   . Hypertension   . Hypothyroidism     Past Surgical History:  Procedure Laterality Date  . ABDOMINAL HYSTERECTOMY    .  CATARACT EXTRACTION    . CHOLECYSTECTOMY    . Thyroidectomy       Current Outpatient Medications  Medication Sig Dispense Refill  . atenolol (TENORMIN) 25 MG tablet Take 25 mg by mouth 2 (two) times daily.    . calcium carbonate (OS-CAL - DOSED IN MG OF ELEMENTAL CALCIUM) 1250 MG tablet Take 1 tablet by mouth 2 (two) times daily.     . Cholecalciferol (VITAMIN D) 2000 UNITS tablet Take 2,000 Units by mouth daily.     . cycloSPORINE (RESTASIS) 0.05 % ophthalmic emulsion Place 1 drop into both eyes 2 (two) times daily.    . dorzolamide (TRUSOPT) 2 % ophthalmic solution Place 1 drop into the left eye 2 (two) times daily.    Marland Kitchen estradiol (ESTRACE VAGINAL) 0.1 MG/GM vaginal cream Place 2 g vaginally once a  week.     . Fiber POWD Take 1 Dose by mouth daily.    . furosemide (LASIX) 40 MG tablet Take 40 mg by mouth as directed. 2 TABLETS BY MOUTH IN THE MORNING AND 1 IN LATE AFTERNOON    . isosorbide mononitrate (IMDUR) 30 MG 24 hr tablet Take 3 tablets (90 mg total) by mouth daily. 270 tablet 3  . latanoprost (XALATAN) 0.005 % ophthalmic solution Place 1 drop into both eyes at bedtime.     Marland Kitchen levothyroxine (SYNTHROID, LEVOTHROID) 100 MCG tablet Take 100 mcg by mouth daily.     . Magnesium 500 MG TABS Take 800 mg by mouth at bedtime.     . Multiple Vitamins-Minerals (PRESERVISION AREDS 2 PO) Take 2 capsules by mouth.    . nitroGLYCERIN (NITROSTAT) 0.4 MG SL tablet DISSOLVE 1 TABLET UNDER THE TONGUE EVERY 5 MINUTES FOR 3 DOSES AS NEEDED  FOR  CHEST  PAIN 50 tablet 2  . potassium chloride (K-DUR,KLOR-CON) 10 MEQ tablet TAKE 2 TABLETS EVERY DAY 180 tablet 2  . pramipexole (MIRAPEX) 0.125 MG tablet TAKE 1 TABLET EVERY EVENING 90 tablet 3  . temazepam (RESTORIL) 15 MG capsule Take 15 mg by mouth at bedtime.    . vitamin C (ASCORBIC ACID) 500 MG tablet Take 500 mg by mouth daily.    Marland Kitchen warfarin (COUMADIN) 5 MG tablet TAKE 1/2 TO 1 TABLET DAILY AS DIRECTED BY COUMADIN CLINIC 90 tablet 0   No current facility-administered medications for this visit.     Allergies:   Azithromycin; Compazine [prochlorperazine edisylate]; Demerol; Dolophine [methadone]; Ebastine; Erythromycin; Ibuprofen; Statins; Tetanus toxoids; and Tetracyclines & related    Social History:  The patient  reports that she has never smoked. She has never used smokeless tobacco. She reports that she does not drink alcohol or use drugs.   Family History:  The patient's family history includes Stroke in her father and mother.    ROS:  Please see the history of present illness.   Otherwise, review of systems are positive for none.   All other systems are reviewed and negative.    PHYSICAL EXAM: VS:  BP (!) 146/83   Pulse 69   Ht 5' (1.524  m)   Wt 139 lb 9.6 oz (63.3 kg)   SpO2 91%   BMI 27.26 kg/m  , BMI Body mass index is 27.26 kg/m. GENERAL:  Well appearing.  No acute distress.  HEENT: Pupils equal round and reactive, fundi not visualized, oral mucosa unremarkable NECK:  +jugular venous distention, waveform within normal limits, carotid upstroke brisk and symmetric, no bruits LUNGS:  Clear to auscultation bilaterally HEART:  Irregularly  irregular.  PMI not displaced or sustained,S1 and S2 within normal limits, no S3, no S4, no clicks, no rubs, no murmurs ABD:  Flat, positive bowel sounds normal in frequency in pitch, no bruits, no rebound, no guarding, no midline pulsatile mass, no hepatomegaly, no splenomegaly EXT:  2 plus pulses throughout, no edema, no cyanosis no clubbing SKIN:  No rashes no nodules NEURO:  Cranial nerves II through XII grossly intact, motor grossly intact throughout PSYCH:  Cognitively intact, oriented to person place and time   EKG:  EKG is not ordered today. 05/06/16: Atrial fibrillation rate 72 bpm  Echo 11/11/14: Study Conclusions  - Left ventricle: The cavity size was normal. Wall thickness was normal. Systolic function was normal. The estimated ejection fraction was in the range of 60% to 65%. - Aortic valve: There was mild regurgitation. - Mitral valve: Calcified annulus. Mildly thickened leaflets . There was mild regurgitation. - Left atrium: The atrium was mildly dilated. - Right atrium: The atrium was mildly dilated.  Recent Labs: 06/10/2017: ALT 27 12/11/2017: Hemoglobin 14.0; Platelets 191 12/19/2017: BUN 9; Creatinine, Ser 0.83; Potassium 4.3; Sodium 140    Lipid Panel    Component Value Date/Time   CHOL 211 (H) 11/04/2012 1524   TRIG 201.0 (H) 11/04/2012 1524   HDL 66.80 11/04/2012 1524   CHOLHDL 3 11/04/2012 1524   VLDL 40.2 (H) 11/04/2012 1524   LDLCALC 109 (H) 04/16/2012 1043   LDLDIRECT 98.8 11/04/2012 1524   03/28/16: Sodium 136, potassium 4.1, BUN 17,  creatinine 0.74 AST 24, ALT 17 WBC 10, hemoglobin 15.7, hematocrit 46.8, platelets 291  07/21/15: TSH 1.52, free T4 1 0.25  04/13/15: Total cholesterol 235, triglycerides 150, HDL 66, LDL 139  03/08/15: BNP 601  Wt Readings from Last 3 Encounters:  12/30/17 139 lb 9.6 oz (63.3 kg)  12/11/17 147 lb (66.7 kg)  06/10/17 132 lb (59.9 kg)      ASSESSMENT AND PLAN:  # Chest pain: Resolved.    # Chronic atrial fibrillation: Rate well-controlled.  Continue atenolol and warfarin.   This patients CHA2DS2-VASc Score and unadjusted Ischemic Stroke Rate (% per year) is equal to 4.8 % stroke rate/year from a score of 4  Above score calculated as 1 point each if present [CHF, HTN, DM, Vascular=MI/PAD/Aortic Plaque, Age if 65-74, or Female] Above score calculated as 2 points each if present [Age > 75, or Stroke/TIA/TE]   # Acute on chronic diastolic heart failure: Ms. Grime has JVD and is short of breath.  BMP and BNP in 1 week.  # Hypertension: BP is\above goal here but has been better at home.  Continue atenolol, Imdur and increase lasix as above.   Current medicines are reviewed at length with the patient today.  The patient does not have concerns regarding medicines.  The following changes have been made:  Increase lasix.   Labs/ tests ordered today include:   Orders Placed This Encounter  Procedures  . Basic metabolic panel  . ECHOCARDIOGRAM COMPLETE     Disposition:   FU with Vangie Henthorn C. Oval Linsey, MD, [facility] in 2 months.     Signed, Jailine Lieder C. Oval Linsey, MD, [facility]  12/30/2017 5:01 PM    [place]

## 2017-12-30 NOTE — Patient Instructions (Addendum)
Medication Instructions:  INCREASE YOUR LASIX (FUROSEMIDE) TO 80 MG IN THE MORNING AND 40 MG IN THE AFTERNOON  TAKE 4 OF YOUR CURRENT PILLS IN THE AM AND 2 IN THE AFTERNOON UNTIL YOU RUN OUT. CALL WHEN YOU NEED NEW PRESCRIPTION SENT TO THE PHARMACY   Labwork: BMET/BNP 1 WEEK   Testing/Procedures: Your physician has requested that you have an echocardiogram. Echocardiography is a painless test that uses sound waves to create images of your heart. It provides your doctor with information about the size and shape of your heart and how well your heart's chambers and valves are working. This procedure takes approximately one hour. There are no restrictions for this procedure. [address]  Follow-Up: Your physician recommends that you schedule a follow-up appointment in: 2 MONTH OV   If you need a refill on your cardiac medications before your next appointment, please call your pharmacy.

## 2018-01-02 DIAGNOSIS — N8182 Incompetence or weakening of pubocervical tissue: Secondary | ICD-10-CM | POA: Diagnosis not present

## 2018-01-02 DIAGNOSIS — N8189 Other female genital prolapse: Secondary | ICD-10-CM | POA: Diagnosis not present

## 2018-01-06 ENCOUNTER — Other Ambulatory Visit (HOSPITAL_COMMUNITY): Payer: Medicare HMO

## 2018-01-07 ENCOUNTER — Other Ambulatory Visit: Payer: Self-pay

## 2018-01-07 ENCOUNTER — Ambulatory Visit (HOSPITAL_COMMUNITY): Payer: Medicare HMO | Attending: Cardiology

## 2018-01-07 DIAGNOSIS — I5032 Chronic diastolic (congestive) heart failure: Secondary | ICD-10-CM | POA: Insufficient documentation

## 2018-01-07 DIAGNOSIS — E039 Hypothyroidism, unspecified: Secondary | ICD-10-CM | POA: Insufficient documentation

## 2018-01-07 DIAGNOSIS — I272 Pulmonary hypertension, unspecified: Secondary | ICD-10-CM | POA: Diagnosis not present

## 2018-01-07 DIAGNOSIS — I110 Hypertensive heart disease with heart failure: Secondary | ICD-10-CM | POA: Insufficient documentation

## 2018-01-07 DIAGNOSIS — R0602 Shortness of breath: Secondary | ICD-10-CM | POA: Insufficient documentation

## 2018-01-07 DIAGNOSIS — E785 Hyperlipidemia, unspecified: Secondary | ICD-10-CM | POA: Diagnosis not present

## 2018-01-07 DIAGNOSIS — I4891 Unspecified atrial fibrillation: Secondary | ICD-10-CM | POA: Insufficient documentation

## 2018-01-08 ENCOUNTER — Other Ambulatory Visit: Payer: Self-pay | Admitting: *Deleted

## 2018-01-08 ENCOUNTER — Encounter: Payer: Self-pay | Admitting: *Deleted

## 2018-01-08 NOTE — Patient Outreach (Signed)
[facility]) Care Management   01/08/2018  Kristen Whitehead 10/05/1922 [id number]  Kristen Whitehead is an 84 y.o. female  Subjective:   Member alert and oriented x3, denies shortness of breath or chest discomfort.  Report she is taking medications as prescribed, denies concerns.  Last visit with cardiologist on 5/14, Furosemide was changed.  Follow up in 2 months.  Objective:   Review of Systems  Constitutional: Negative.   HENT: Negative.   Eyes: Negative.   Respiratory: Negative.   Cardiovascular: Negative.   Gastrointestinal: Negative.   Genitourinary: Negative.   Musculoskeletal: Negative.   Skin: Negative.   Neurological: Negative.   Endo/Heme/Allergies: Negative.   Psychiatric/Behavioral: Negative.     Physical Exam  Constitutional: She is oriented to person, place, and time. She appears well-developed and well-nourished.  Neck: Normal range of motion.  Cardiovascular: Normal rate, regular rhythm and normal heart sounds.  Respiratory: Effort normal and breath sounds normal.  GI: Soft. Bowel sounds are normal.  Musculoskeletal: Normal range of motion.  Neurological: She is alert and oriented to person, place, and time.  Skin: Skin is warm and dry.   BP (!) 149/89 (BP Location: Left Arm, Patient Position: Sitting, Cuff Size: Normal)   Pulse 69   Resp 18   Ht 1.524 m (5')   Wt 136 lb (61.7 kg)   SpO2 97%   BMI 26.56 kg/m   Encounter Medications:   Outpatient Encounter Medications as of 01/08/2018  Medication Sig Note  . atenolol (TENORMIN) 25 MG tablet Take 25 mg by mouth 2 (two) times daily.   . calcium carbonate (OS-CAL - DOSED IN MG OF ELEMENTAL CALCIUM) 1250 MG tablet Take 1 tablet by mouth 2 (two) times daily.    . Cholecalciferol (VITAMIN D) 2000 UNITS tablet Take 2,000 Units by mouth daily.    . cycloSPORINE (RESTASIS) 0.05 % ophthalmic emulsion Place 1 drop into both eyes 2 (two) times daily.   . dorzolamide (TRUSOPT) 2 %  ophthalmic solution Place 1 drop into the left eye 2 (two) times daily.   Marland Kitchen estradiol (ESTRACE VAGINAL) 0.1 MG/GM vaginal cream Place 2 g vaginally once a week.  08/01/2014: .   Marland Kitchen Fiber POWD Take 1 Dose by mouth daily.   . furosemide (LASIX) 40 MG tablet Take 40 mg by mouth as directed. 2 TABLETS BY MOUTH IN THE MORNING AND 1 IN LATE AFTERNOON   . isosorbide mononitrate (IMDUR) 30 MG 24 hr tablet Take 3 tablets (90 mg total) by mouth daily.   Marland Kitchen latanoprost (XALATAN) 0.005 % ophthalmic solution Place 1 drop into both eyes at bedtime.    Marland Kitchen levothyroxine (SYNTHROID, LEVOTHROID) 100 MCG tablet Take 100 mcg by mouth daily.    . Magnesium 500 MG TABS Take 800 mg by mouth at bedtime.    . Multiple Vitamins-Minerals (PRESERVISION AREDS 2 PO) Take 2 capsules by mouth.   . potassium chloride (K-DUR,KLOR-CON) 10 MEQ tablet TAKE 2 TABLETS EVERY DAY   . pramipexole (MIRAPEX) 0.125 MG tablet TAKE 1 TABLET EVERY EVENING   . temazepam (RESTORIL) 15 MG capsule Take 15 mg by mouth at bedtime.   . vitamin C (ASCORBIC ACID) 500 MG tablet Take 500 mg by mouth daily.   Marland Kitchen warfarin (COUMADIN) 5 MG tablet TAKE 1/2 TO 1 TABLET DAILY AS DIRECTED BY COUMADIN CLINIC   . nitroGLYCERIN (NITROSTAT) 0.4 MG SL tablet DISSOLVE 1 TABLET UNDER THE TONGUE EVERY 5 MINUTES FOR 3 DOSES AS NEEDED  FOR  CHEST  PAIN (Patient not taking: Reported on 01/08/2018)    No facility-administered encounter medications on file as of 01/08/2018.     Functional Status:   In your present state of health, do you have any difficulty performing the following activities: 01/08/2018  Hearing? N  Vision? Y  Difficulty concentrating or making decisions? Y  Walking or climbing stairs? Y  Comment Need a rail  Dressing or bathing? N  Doing errands, shopping? N  Preparing Food and eating ? N  Using the Toilet? N  In the past six months, have you accidently leaked urine? Y  Do you have problems with loss of bowel control? N  Managing your Medications? N   Managing your Finances? N  Housekeeping or managing your Housekeeping? Y  Some recent data might be hidden    Fall/Depression Screening:    Fall Risk  01/08/2018 07/24/2016  Falls in the past year? Yes No  Number falls in past yr: 1 -  Injury with Fall? No -  Risk for fall due to : History of fall(s) -  Follow up Falls prevention discussed -   PHQ 2/9 Scores 12/16/2017  PHQ - 2 Score 0    Assessment:    Met with member at scheduled time.  [facility] care management services again explained, consent obtained.    No distress noted, state she feel she has been managing her health conditions well.  She has scale and blood pressure monitor in the home, this care manager confirmed she is aware of how to use correctly.    Medications reviewed in the home, new pill box provided.  She state she think she is in the donut hole, will have [facility] pharmacist review.  Denies any other concerns at this time.  Provided with this care manager's contact information, advised to contact with questions.  Provided with [facility] calendar tool book and access to 24 hour nurse triage line.  Plan:   Will follow up with member next month.  THN CM Care Plan Problem One     Most Recent Value  Care Plan Problem One  Knowledge deficit regarding management of heart failure   Role Documenting the Problem One  Care Management [place] for Problem One  Active  THN Long Term Goal   Member will verbalize understanding of heart failure action plan/zones within the next 31 days  THN Long Term Goal Start Date  01/08/18  Interventions for Problem One Long Term Goal  Educated on heart failure zones.  Provided with copies of heart failure zones and Living Better with Heart Failure folder.  THN CM Short Term Goal #1   Member will report taking and recording daily weights over the next 4 weeks  THN CM Short Term Goal #1 Start Date  01/08/18  Interventions for Short Term Goal #1  Provided with logs for daily weights,  advised of importance of recording and contacting MD of weight change  THN CM Short Term Goal #2   Member will report decrease in high salt foods over the next 4 weeks  THN CM Short Term Goal #2 Start Date  01/08/18  Interventions for Short Term Goal #2  Educated on low sodium diet, educated on reading food labels     Valente David, Therapist, sports, MSN [place] Manager [phone]

## 2018-01-13 ENCOUNTER — Telehealth: Payer: Self-pay | Admitting: Cardiovascular Disease

## 2018-01-13 ENCOUNTER — Telehealth: Payer: Self-pay | Admitting: *Deleted

## 2018-01-13 DIAGNOSIS — R0602 Shortness of breath: Secondary | ICD-10-CM | POA: Diagnosis not present

## 2018-01-13 DIAGNOSIS — R110 Nausea: Secondary | ICD-10-CM

## 2018-01-13 DIAGNOSIS — R2681 Unsteadiness on feet: Secondary | ICD-10-CM

## 2018-01-13 DIAGNOSIS — R531 Weakness: Secondary | ICD-10-CM

## 2018-01-13 DIAGNOSIS — Z5181 Encounter for therapeutic drug level monitoring: Secondary | ICD-10-CM | POA: Diagnosis not present

## 2018-01-13 NOTE — Telephone Encounter (Addendum)
Spoke with patient around 1:30 pm regarding her weight fluctuating, weakness, unsteady gait, and feeling nausea at times. Her weight over last few days   01/10/18 wt 135.0 lbs  01/11/18 wt 134.8 lbs  01/12/18 wt 136.2 lbs  01/13/18 wt 133.6 lbs  Denies any shortness of breath. Does not think she has had increased urinary output. Advised patient to come for follow up labs today or tomorrow.SBP usually run around 130's-140's Will forward to Dr Oval Linsey for review

## 2018-01-13 NOTE — Telephone Encounter (Signed)
New Message:      Pt c/o swelling: STAT is pt has developed SOB within 24 hours  1) How much weight have you gained and in what time span? 3 lbs  2) If swelling, where is the swelling located? chest  3) Are you currently taking a fluid pill? Yes   4) Are you currently SOB? No  5) Do you have a log of your daily weights (if so, list)?  6) Have you gained 3 pounds in a day or 5 pounds in a week? 3 lbs  7) Have you traveled recently? No       Pt states she gained 3 lbs in one day and then she lost 3.5 lbs the next day. Pt states she feels week but has been feeling like this for a while and has seen the doctor since this started.

## 2018-01-13 NOTE — Addendum Note (Signed)
Addended by: Alvina Filbert B on: 01/13/2018 01:50 PM   Modules accepted: Orders, Level of Service, SmartSet

## 2018-01-13 NOTE — Telephone Encounter (Signed)
Spoke with patient and her weight

## 2018-01-13 NOTE — Telephone Encounter (Signed)
Janan Ridge at 01/13/2018 9:04 AM      Status: Signed    New Message:      Pt c/o swelling: STAT is pt has developed SOB within 24 hours  1. How much weight have you gained and in what time span? 3 lbs  2. If swelling, where is the swelling located? chest  3. Are you currently taking a fluid pill? Yes   4. Are you currently SOB? No  5. Do you have a log of your daily weights (if so, list)?  6. Have you gained 3 pounds in a day or 5 pounds in a week? 3 lbs  7. Have you traveled recently? No

## 2018-01-13 NOTE — Telephone Encounter (Signed)
This encounter was created in error - please disregard.

## 2018-01-14 LAB — BASIC METABOLIC PANEL
BUN / CREAT RATIO: 17 (ref 12–28)
BUN: 13 mg/dL (ref 10–36)
CO2: 28 mmol/L (ref 20–29)
CREATININE: 0.77 mg/dL (ref 0.57–1.00)
Calcium: 8.8 mg/dL (ref 8.7–10.3)
Chloride: 95 mmol/L — ABNORMAL LOW (ref 96–106)
GFR calc Af Amer: 77 mL/min/{1.73_m2} (ref >59)
GFR, EST NON AFRICAN AMERICAN: 67 mL/min/{1.73_m2} (ref >59)
Glucose: 82 mg/dL (ref 65–99)
Potassium: 4.1 mmol/L (ref 3.5–5.2)
SODIUM: 138 mmol/L (ref 134–144)

## 2018-01-15 ENCOUNTER — Telehealth: Payer: Self-pay | Admitting: Pharmacist

## 2018-01-15 NOTE — Telephone Encounter (Signed)
Spoke with patient. She continues to feel weak, no blood pressure changes. She will come by office tomorrow for labs.

## 2018-01-15 NOTE — Telephone Encounter (Signed)
Follow Up:   Please call,she still is not feeling any better.

## 2018-01-15 NOTE — Telephone Encounter (Signed)
Kidney function and electrolytes are stable.  It doesn't look like she is dehydrated.  Keep current med doses and check labs as ordered.

## 2018-01-15 NOTE — Patient Outreach (Signed)
[facility]) Care Management  01/15/2018  Kristen Whitehead 1923/05/01 [id number]  Communication received from [facility] RN to contact patient regarding a question about being in the coverage gap.   Successful call placed to Kristen Whitehead today. HIPAA identifiers verified. Patient reports that she was told by her local pharmacy that she was in the coverage gap but she has not confirmed this yet with [employer].  I offered to make a 3-way call with Kristen Whitehead to verify insurance phase but Kristen Whitehead declined.  She reports she may have an EOB in her house that she will try to find and will call [place] herself in the next few days.  She denies any problems affording her medications at this time.    Patient reports she continues to not feel well and has noticed memory issues lately.  She has already contacted her cardiologist for recommendations.  I advised her to call her PCP as well to be seen.  Patient voiced understanding.  She denies any medication concerns at this time.   Plan: I will close [place] case at this time as no further medication needs noted.   Ralene Bathe, PharmD, [place] [phone]

## 2018-01-16 ENCOUNTER — Other Ambulatory Visit: Payer: Self-pay | Admitting: *Deleted

## 2018-01-16 DIAGNOSIS — R110 Nausea: Secondary | ICD-10-CM | POA: Diagnosis not present

## 2018-01-16 DIAGNOSIS — R2681 Unsteadiness on feet: Secondary | ICD-10-CM | POA: Diagnosis not present

## 2018-01-16 DIAGNOSIS — Z5181 Encounter for therapeutic drug level monitoring: Secondary | ICD-10-CM | POA: Diagnosis not present

## 2018-01-16 DIAGNOSIS — R531 Weakness: Secondary | ICD-10-CM | POA: Diagnosis not present

## 2018-01-16 NOTE — Patient Outreach (Signed)
[place] [facility]) Care Management  01/16/2018  JOHNNETTE LAUX 01-11-1923 [id number]   Voice message received from member stating that she was not feeling well, no details provided, only request call back.  Call placed to member for follow up, no answer.  HIPAA compliant voice message left, will await call back.  If no call back, will follow up within the next week.  Valente David, [place], MSN [place] [phone]

## 2018-01-17 LAB — CBC WITH DIFFERENTIAL/PLATELET
BASOS: 0 %
Basophils Absolute: 0 10*3/uL (ref 0.0–0.2)
EOS (ABSOLUTE): 0.1 10*3/uL (ref 0.0–0.4)
EOS: 2 %
HEMATOCRIT: 43.9 % (ref 34.0–46.6)
Hemoglobin: 14.2 g/dL (ref 11.1–15.9)
IMMATURE GRANULOCYTES: 0 %
Immature Grans (Abs): 0 10*3/uL (ref 0.0–0.1)
Lymphocytes Absolute: 2.2 10*3/uL (ref 0.7–3.1)
Lymphs: 36 %
MCH: 30.7 pg (ref 26.6–33.0)
MCHC: 32.3 g/dL (ref 31.5–35.7)
MCV: 95 fL (ref 79–97)
MONOS ABS: 0.7 10*3/uL (ref 0.1–0.9)
Monocytes: 12 %
NEUTROS PCT: 50 %
Neutrophils Absolute: 3 10*3/uL (ref 1.4–7.0)
PLATELETS: 238 10*3/uL (ref 150–450)
RBC: 4.62 x10E6/uL (ref 3.77–5.28)
RDW: 14.9 % (ref 12.3–15.4)
WBC: 6.1 10*3/uL (ref 3.4–10.8)

## 2018-01-17 LAB — COMPREHENSIVE METABOLIC PANEL
A/G RATIO: 1.6 (ref 1.2–2.2)
ALT: 14 IU/L (ref 0–32)
AST: 25 IU/L (ref 0–40)
Albumin: 4 g/dL (ref 3.2–4.6)
Alkaline Phosphatase: 80 IU/L (ref 39–117)
BUN/Creatinine Ratio: 15 (ref 12–28)
BUN: 12 mg/dL (ref 10–36)
Bilirubin Total: 1 mg/dL (ref 0.0–1.2)
CALCIUM: 8.6 mg/dL — AB (ref 8.7–10.3)
CO2: 28 mmol/L (ref 20–29)
CREATININE: 0.81 mg/dL (ref 0.57–1.00)
Chloride: 97 mmol/L (ref 96–106)
GFR calc Af Amer: 72 mL/min/{1.73_m2} (ref >59)
GFR, EST NON AFRICAN AMERICAN: 63 mL/min/{1.73_m2} (ref >59)
Globulin, Total: 2.5 g/dL (ref 1.5–4.5)
Glucose: 83 mg/dL (ref 65–99)
POTASSIUM: 4.4 mmol/L (ref 3.5–5.2)
Sodium: 140 mmol/L (ref 134–144)
TOTAL PROTEIN: 6.5 g/dL (ref 6.0–8.5)

## 2018-01-17 LAB — PRO B NATRIURETIC PEPTIDE: NT-Pro BNP: 1923 pg/mL — ABNORMAL HIGH (ref 0–738)

## 2018-01-18 ENCOUNTER — Encounter: Payer: Self-pay | Admitting: *Deleted

## 2018-01-19 ENCOUNTER — Other Ambulatory Visit: Payer: Self-pay | Admitting: *Deleted

## 2018-01-19 NOTE — Patient Outreach (Signed)
[place] [facility]) Care Management  01/19/2018  MARGARETMARY PRISK 09/10/1922 [id number]   Call received from member stating that she is confused about how to take her eye drops.  Made aware that this care manager will have [facility] pharmacist to review.  This care manager inquired about her call last week stating she wasn't feeling well, she state she is better now, awaiting test results.  Denies any urgent concerns at this time, will follow up within the next 2 weeks.  Valente David, [place], MSN [place] [phone]

## 2018-01-19 NOTE — Telephone Encounter (Signed)
-----   Message from [name], MD sent at 01/19/2018  5:01 PM EDT ----- Blood counts are normal.   This second set of labs shows her fluid levels are elevated again.  Increase lasix to 80 mg bid.  She needs follow up in a couple weeks with APP.

## 2018-01-19 NOTE — Telephone Encounter (Signed)
Advised patient of lab results and medication changes. Scheduled follow up with DNP Curt Bears L per patient request

## 2018-01-20 ENCOUNTER — Other Ambulatory Visit: Payer: Self-pay | Admitting: Pharmacist

## 2018-01-20 NOTE — Patient Outreach (Signed)
[facility]) Care Management  01/20/2018  [name] [birth year] [id number]  Request from [facility] RN to contact patient regarding medication question with an eye drop.   Successful call placed to [name]. HIPAA identifiers verified. Patient reports that she did call [employer] and confirmed that she in now in the coverage gap.  She has decided to stop taking Restasis and Estrace Vaginal cream due to cost.    Patient reports that she has had problems with Dr. Herbert Deaner and Dr. [name] because of communication and would like to change to a different ophthalmologist.  I provided patient with several different options of opthalmologists in [place].    No other medication issues at this time.    Plan: I will keep [facility] pharmacy case closed at this time.   Ralene Bathe, PharmD, [place] [phone]

## 2018-01-22 ENCOUNTER — Ambulatory Visit: Payer: Medicare HMO | Admitting: Neurology

## 2018-01-22 ENCOUNTER — Encounter: Payer: Self-pay | Admitting: Neurology

## 2018-01-22 VITALS — BP 136/74 | HR 66 | Ht 60.0 in | Wt 136.0 lb

## 2018-01-22 DIAGNOSIS — G2581 Restless legs syndrome: Secondary | ICD-10-CM | POA: Diagnosis not present

## 2018-01-22 DIAGNOSIS — M62838 Other muscle spasm: Secondary | ICD-10-CM

## 2018-01-22 NOTE — Progress Notes (Signed)
Subjective:    Patient ID: Kristen Whitehead is a 84 y.o. female.  HPI     Interim history:   [name] is a very pleasant 84 year old right-handed woman with an underlying medical history of hypertension, hyperlipidemia, atrial fibrillation, celiac disease, scoliosis, reflux disease, who presents for followup consultation of her bilateral foot pain, likely RLS. She is unaccompanied today. I last saw her on 01/22/2017, at which time she reported doing well, memory loss stable, reported mostly word finding difficulty, was sleeping well, less cramping, in fact, she felt that her muscle spasms and cramps were completely gone. I suggested she continue with low-dose Mirapex 0.125 mg strength generic at night and follow-up in one year.   Today, 01/22/2018 (all dictated new, as well as above notes, some dictation done in note pad or Word, outside of chart, may appear as copied):  She reports feeling stable with foot pain and RLS Sx.  Had recent echo on 01/13/18: Impressions:   - The patient was in atrial fibrillation. Normal LV size with EF   60-65%. Normal RV size and systolic function. Moderate pulmonary   hypertension. Moderate biatrial enlargement.  She tries not to stress or worry. Takes the Mirapex each night. She has a hard time finding shoes that are comfortable and not exacerbate her foot pain.   The patient's allergies, current medications, family history, past medical history, past social history, past surgical history and problem list were reviewed and updated as appropriate.    Previously (copied from previous notes for reference):    I saw her on 07/24/2016, at which time she reported cramping in her legs, more so on the right and particularly at night. She felt she had muscle spasms. She would walk around extensively at night to relieve the muscle spasms. She admitted that she did not always drink enough water. She also had a tendency to skip breakfast. She was advised to  continue with low dose pramipexole 0.125 mg each night but advised to drink more water, perhaps Gatorade and even tonic water to see if her muscle spasms and cramping would improve. She was also advised to have a more balanced diet and not to skip any meals.    I saw her on 01/23/2016, at which time she reported doing well. She had no recent pain in her hands or feet and no cramping was reported, no paresthesias. She had a recent cardiology visit in March 2017. She reported some short-term memory issues and word finding issues. She was quite active physically and mentally. I suggested we monitor her memory complaints. She was advised to continue with low-dose Mirapex.   I saw her on 07/24/2015, at which time she reported feeling stable. She felt that there was tingling in her legs and she had a burning sensation in her feet. She did feel that Mirapex was helpful. She had some intermittent tingling in her fingers. She also reported feeling more forgetful. I suggested we continue with low dose pramipexole 0.125 mg each night.   I saw her on 01/20/2015 at which time she reported feeling stable for the most part. Her burning sensation seemed about the same and her leg cramping seemed a little less overall. She was tolerating low-dose Mirapex. She saw her cardiologist 2 days prior. She had presented to the emergency room on 11/10/2014 with chest pain and dizziness. She was admitted for workup. Cardiac workup was negative. She has A. Fib, rate controlled. She has stable lower extremity swelling and was reminded by  her cardiologist to use compression stockings. She was not drinking enough water. I asked her to continue low-dose Mirapex each night.   I saw her on 07/21/2014, at which time she reported a fall about 3 months prior. She had tripped over something. She had no significant injuries and no sequelae. She was not drinking enough water. Her leg swelling was stable. I suggested she continue with low-dose  Mirapex, 0.125 mg once nightly.   I saw her on 12/27/2013, at which time she noted ankle swelling. She had gone into cardiac failure the year before and felt similar to that. Nevertheless, because of her lower extremity swelling I suggested she cut back on Mirapex to 0.125 mg each night and see if the swelling improves. If she had flareup in her restless leg symptoms I suggested she could go back to 0.25 mg nightly.   I saw her on 06/28/2013, at which time I noted on clinical exam that she had mild evidence of neuropathy in the absence of a history of diabetes with normal hemoglobin A1c noted and normal B12 levels. She had EMG and nerve conduction studies that did not show any significant neuropathy. I felt that she had most likely RLS. She had done well with Mirapex. I did not make any changes to her medication regimen and asked her to continue taking Mirapex 0.25 mg each night. She did not have any side effects at the time.   I first met her on 02/17/2013, at which time I suggested EMG and nerve conduction testing and a treatment trial with Mirapex because of possible underlying RLS. She has previously seen a podiatrist who had done a nerve biopsy which she reported was normal. She has a history of A. fib. She has a history of congestive heart failure and is followed by cardiology. Echocardiogram in January 2014 showed biatrial enlargement and mild mitral regurgitation and an EF of 82-50% with diastolic dysfunction.   For years she has had burning in her feet and started having a crawling sensation in her distal legs in 2014. She has also had cramping in her muscles for years and she was taken off of her statin in 2013 and her cramping eventually became better. She has had pins and needles sensation and it helps to walk around. She has worse symptoms at night or by the end of the day. She remembers having "terrible growing pains" as a child and does not have a FHx of RLS as far as she knows. She had nerve  biopsies both legs by her podiatrist, which did not show inflammation. While she has a history of chronic back pain she denied any radiating back pain to her legs. In fact she described pins and needle and crawling sensation in the distal lower extremities, radiating upwards. She has been taking OTC Mg supplements, which helped her cramps.   Her EMG and nerve conduction testing from 03/03/2013 showed no evidence of peripheral neuropathy. We called her with her test results. Her foot pain and paresthesias improved.  Her Past Medical History Is Significant For: Past Medical History:  Diagnosis Date  . Bladder prolapse, female, acquired   . Cancer ([place])    thyroid  . Chest pain    no known ischemic heart disease; negative Myoview July 2013. EF 75% with no ischemia.   . Chronic anticoagulation   . Chronic atrial fibrillation ([facility])    managed with rate control and coumadin  . Chronic diastolic heart failure ([place])   . GERD (gastroesophageal  reflux disease)   . Heart disease   . History of congenital mitral regurgitation    mild  . History of mitral valve prolapse 06/15/2008   a. echo 1/14: mild LVH, EF 60-65%, mild MR, mild to mod BAE, PASP 35  . Hypercholesterolemia   . Hypertension   . Hypothyroidism     Her Past Surgical History Is Significant For: Past Surgical History:  Procedure Laterality Date  . ABDOMINAL HYSTERECTOMY    . CATARACT EXTRACTION    . CHOLECYSTECTOMY    . Thyroidectomy      Her Family History Is Significant For: Family History  Problem Relation Age of Onset  . Stroke Mother   . Stroke Father   . Heart attack Neg Hx   . Heart disease Neg Hx     Her Social History Is Significant For: Social History   Socioeconomic History  . Marital status: Widowed    Spouse name: Not on file  . Number of children: 3  . Years of education: BA  . Highest education level: Not on file  Occupational History    Employer: RETIRED  Social Needs  . Financial resource  strain: Not on file  . Food insecurity:    Worry: Not on file    Inability: Not on file  . Transportation needs:    Medical: Not on file    Non-medical: Not on file  Tobacco Use  . Smoking status: Never Smoker  . Smokeless tobacco: Never Used  Substance and Sexual Activity  . Alcohol use: No  . Drug use: No  . Sexual activity: Never  Lifestyle  . Physical activity:    Days per week: Not on file    Minutes per session: Not on file  . Stress: Not on file  Relationships  . Social connections:    Talks on phone: Not on file    Gets together: Not on file    Attends religious service: Not on file    Active member of club or organization: Not on file    Attends meetings of clubs or organizations: Not on file    Relationship status: Not on file  Other Topics Concern  . Not on file  Social History Narrative   Pt lives at home alone.   Caffeine Use: quit 46yr ago    Her Allergies Are:  Allergies  Allergen Reactions  . Azithromycin Nausea Only  . Compazine [Prochlorperazine Edisylate] Nausea Only  . Demerol Nausea Only  . Dolophine [Methadone] Nausea Only  . Ebastine Nausea Only    EBS  . Erythromycin Nausea Only  . Ibuprofen Nausea Only  . Statins Other (See Comments)    myalgias  . Tetanus Toxoids Nausea Only  . Tetracyclines & Related Nausea Only  :   Her Current Medications Are:  Outpatient Encounter Medications as of 01/22/2018  Medication Sig  . atenolol (TENORMIN) 25 MG tablet Take 25 mg by mouth 2 (two) times daily.  . calcium carbonate (OS-CAL - DOSED IN MG OF ELEMENTAL CALCIUM) 1250 MG tablet Take 1 tablet by mouth 2 (two) times daily.   . Cholecalciferol (VITAMIN D) 2000 UNITS tablet Take 2,000 Units by mouth daily.   . cycloSPORINE (RESTASIS) 0.05 % ophthalmic emulsion Place 1 drop into both eyes 2 (two) times daily.  . dorzolamide (TRUSOPT) 2 % ophthalmic solution Place 1 drop into the left eye 2 (two) times daily.  .Marland Kitchenestradiol (ESTRACE VAGINAL) 0.1 MG/GM  vaginal cream Place 2 g vaginally once a week.   .Marland Kitchen  Fiber POWD Take 1 Dose by mouth daily.  . furosemide (LASIX) 40 MG tablet Take 80 mg by mouth 2 (two) times daily.   . isosorbide mononitrate (IMDUR) 30 MG 24 hr tablet Take 3 tablets (90 mg total) by mouth daily.  Marland Kitchen latanoprost (XALATAN) 0.005 % ophthalmic solution Place 1 drop into both eyes at bedtime.   Marland Kitchen levothyroxine (SYNTHROID, LEVOTHROID) 100 MCG tablet Take 100 mcg by mouth daily.   . Magnesium 500 MG TABS Take 800 mg by mouth at bedtime.   . Multiple Vitamins-Minerals (PRESERVISION AREDS 2 PO) Take 2 capsules by mouth.  . nitroGLYCERIN (NITROSTAT) 0.4 MG SL tablet DISSOLVE 1 TABLET UNDER THE TONGUE EVERY 5 MINUTES FOR 3 DOSES AS NEEDED  FOR  CHEST  PAIN  . potassium chloride (K-DUR,KLOR-CON) 10 MEQ tablet TAKE 2 TABLETS EVERY DAY  . pramipexole (MIRAPEX) 0.125 MG tablet TAKE 1 TABLET EVERY EVENING  . temazepam (RESTORIL) 15 MG capsule Take 15 mg by mouth at bedtime.  . vitamin C (ASCORBIC ACID) 500 MG tablet Take 500 mg by mouth daily.  Marland Kitchen warfarin (COUMADIN) 5 MG tablet TAKE 1/2 TO 1 TABLET DAILY AS DIRECTED BY COUMADIN CLINIC   No facility-administered encounter medications on file as of 01/22/2018.   :  Review of Systems:  Out of a complete 14 point review of systems, all are reviewed and negative with the exception of these symptoms as listed below: Review of Systems  Neurological:       Pt presents today to discuss her restless legs. Pt denies problems with RLS. Pt reports that she has been diagnosed with CHF.    Objective:  Neurological Exam  Physical Exam Physical Examination:   Vitals:   01/22/18 1134  BP: 136/74  Pulse: 66    General Examination: The patient is a very pleasant 84 y.o. female in no acute distress. She appears well-developed and well-nourished and well groomed. Good spirits.   HEENT:Normocephalic, atraumatic, pupils are equal, round and reactive to light and accommodation. She is s/p bilateral  cataract repairs. Extraocular tracking is good without limitation to gaze excursion or nystagmus noted. Normal smooth pursuit is noted. Hearing is grossly intact. Face is symmetric with normal facial animation and normal facial sensation. Speech is clear with no dysarthria noted. There is no hypophonia. There is no lip, neck/head, jaw or voice tremor. Neck with FROM. Oropharynx exam reveals: mild mouth dryness, adequate dental hygiene and no airway crowding. Tongue protrudes centrally and palate elevates symmetrically.   Chest:Clear to auscultation without wheezing, rhonchi or crackles noted.  Heart:S1+S2+0, irregularly irregular, no murmur noted.   Abdomen:Soft, non-tender and non-distended with normal bowel sounds appreciated on auscultation.  Extremities:There is no edema. Pedal pulses are intact.  Skin: Warm and dry without trophic changes noted. There are spider veins.  Musculoskeletal: exam reveals no obvious joint deformities, tenderness or joint swelling or erythema.   Neurologically:  Mental status: The patient is awake, alert and oriented in all 4 spheres. Her memory, attention, language and knowledge are appropriate. There is no aphasia, agnosia, apraxia or anomia. Speech is clear with normal prosody and enunciation. Thought process is linear. Mood is congruent and affect is normal.  Cranial nerves are as described above under HEENT exam. In addition, shoulder shrug is normal with equal shoulder height noted.  Motor exam: Normal bulk, strength and tone is noted. There is no drift, tremor or rebound. Reflexes are 1+ in the UEs and trace in the knees and absent in both ankles.  Fine motor skills are intact for age globally.  Cerebellar testing shows no dysmetria or intention tremor.  Sensory exam is stable, mildly decreased to all modalities in the distal LEs.  Gait, station and balance: she stands with no significant difficulty. No veering to one side is noted. No leaning to  one side is noted. Posture is age-appropriate and stance is slightly wide-based. She walks cautiously. No cane or walker.    Assessment and Plan:   In summary, SAVAYA HAKES is a very pleasant 84 year old female with an underlying medical history of hypertension, hyperlipidemia, atrial fibrillation, celiac disease, scoliosis, reflux disease, who presents for follow-up consultation of her restless leg symptoms. She has a history c/w neuropathy, but had a neg EMG/NCV in 2014. Clinically she has remained stable. She has had reasonable results with symptomatic treatment of her restlessness with low-dose pramipexole 0.125 mg each night. She continues to tolerate this and reports good results, no side effects reported, exam is stable. She can FU in one year routinely. She knows to call with any interim questions or concerns a refill requests. I answered all her questions today and she was in agreement with the plan. we can renew her prescription when it comes up for renewal later this year routinely, she uses [facility] mail order pharmacy.  I spent 15 minutes in total face-to-face time with the patient, more than 50% of which was spent in counseling and coordination of care, reviewing test results, reviewing medication and discussing or reviewing the diagnosis of RLS, its prognosis and treatment options. Pertinent laboratory and imaging test results that were available during this visit with the patient were reviewed by me and considered in my medical decision making (see chart for details).

## 2018-01-22 NOTE — Patient Instructions (Addendum)
You have remained stable.  Your foot pain is thankfully stable.  We will continue with the low dose pramipexole.

## 2018-01-23 ENCOUNTER — Other Ambulatory Visit: Payer: Self-pay | Admitting: *Deleted

## 2018-01-23 NOTE — Patient Outreach (Signed)
[facility]) Care Management  01/23/2018  JANAIYAH BLACKARD [birth year] [id number]   Call placed to member to follow up on concern regarding medication.  She has been in contact with pharmacist, denies concerns at this time.  Does report she was a little short of breath earlier this morning, but is "fine" now.  Denies weight gain, report compliance with medications.  Appointment with coumadin clinic next week, cardiologist on 6/18.  Agrees to home visit within the next 2 weeks.  Valente David, [place], MSN [place] [phone]

## 2018-01-26 DIAGNOSIS — D2371 Other benign neoplasm of skin of right lower limb, including hip: Secondary | ICD-10-CM | POA: Diagnosis not present

## 2018-01-26 DIAGNOSIS — M79672 Pain in left foot: Secondary | ICD-10-CM | POA: Diagnosis not present

## 2018-01-26 DIAGNOSIS — M21961 Unspecified acquired deformity of right lower leg: Secondary | ICD-10-CM | POA: Diagnosis not present

## 2018-01-26 DIAGNOSIS — M79671 Pain in right foot: Secondary | ICD-10-CM | POA: Diagnosis not present

## 2018-02-02 NOTE — Progress Notes (Signed)
Cardiology Office Note   Date:  02/03/2018   ID:  Kristen Whitehead, DOB 1923-02-15, MRN [id number]  PCP:  Dineen Kid, MD  Cardiologist: Dr. Oval Linsey Chief Complaint  Patient presents with  . Follow-up     History of Present Illness: Kristen Whitehead is a 84 y.o. female who presents for ongoing assessment and management of hypertension, chronic atrial fibrillation, hyperlipidemia, chronic diastolic heart failure, with mild MR and AR, who was last seen by Dr. Oval Linsey on 12/30/2017.  At that time, the patient reported "heart attacks" that occur when she overexerted herself which she describes as pain across her chest associated with shortness of breath.    She does not take nitroglycerin but praise and pain eventually goes away.  She also complained of dyspnea with minimal activity.  No medication changes were made on the office visit.  A BMP and a be met were ordered.  She was continued on Lasix at higher dose which was placed by myself on previous office visit to her visit with Dr. Oval Linsey.  Labs: Sodium 140; potassium 4.4; chloride 97; CO2 28; glucose 83; creatinine 0.81; BUN 12.  Hemoglobin 14.2, hematocrit 43.9, white blood cell 6.1, platelets 238.  She is here today with complaints of fatigue.  Her blood pressure is much better controlled in fact on some occasions it is very hypotensive in the low 100s.  The patient states that she is noticed that her energy level has worsened since increased dose of Lasix although her blood pressure is better controlled.  She states that it is often until late in the evening that her energy returns which is the time she does most of her housework.  In fact she was mopping her floors at midnight the other night as that is when her energy was highest.  She is retired and is trying to remain active but with her age she does not have as much physical stamina as she used to.  She is meticulous on taking her blood pressure and recording it every day.  Past  Medical History:  Diagnosis Date  . Bladder prolapse, female, acquired   . Cancer ([place])    thyroid  . Chest pain    no known ischemic heart disease; negative Myoview July 2013. EF 75% with no ischemia.   . Chronic anticoagulation   . Chronic atrial fibrillation (HCC)    managed with rate control and coumadin  . Chronic diastolic heart failure ([place])   . GERD (gastroesophageal reflux disease)   . Heart disease   . History of congenital mitral regurgitation    mild  . History of mitral valve prolapse 06/15/2008   a. echo 1/14: mild LVH, EF 60-65%, mild MR, mild to mod BAE, PASP 35  . Hypercholesterolemia   . Hypertension   . Hypothyroidism     Past Surgical History:  Procedure Laterality Date  . ABDOMINAL HYSTERECTOMY    . CATARACT EXTRACTION    . CHOLECYSTECTOMY    . Thyroidectomy       Current Outpatient Medications  Medication Sig Dispense Refill  . atenolol (TENORMIN) 25 MG tablet Take 25 mg by mouth 2 (two) times daily.    . calcium carbonate (OS-CAL - DOSED IN MG OF ELEMENTAL CALCIUM) 1250 MG tablet Take 1 tablet by mouth 2 (two) times daily.     . Cholecalciferol (VITAMIN D) 2000 UNITS tablet Take 2,000 Units by mouth daily.     . cycloSPORINE (RESTASIS) 0.05 % ophthalmic emulsion Place 1 drop  into both eyes 2 (two) times daily.    . dorzolamide (TRUSOPT) 2 % ophthalmic solution Place 1 drop into the left eye 2 (two) times daily.    Marland Kitchen estradiol (ESTRACE VAGINAL) 0.1 MG/GM vaginal cream Place 2 g vaginally once a week.     . Fiber POWD Take 1 Dose by mouth daily.    . furosemide (LASIX) 40 MG tablet Take 80 mg by mouth 2 (two) times daily.     . isosorbide mononitrate (IMDUR) 60 MG 24 hr tablet Take 1 tablet (60 mg total) by mouth daily. 30 tablet 1  . latanoprost (XALATAN) 0.005 % ophthalmic solution Place 1 drop into both eyes at bedtime.     Marland Kitchen levothyroxine (SYNTHROID, LEVOTHROID) 100 MCG tablet Take 100 mcg by mouth daily.     . Magnesium 500 MG TABS Take 800 mg by  mouth at bedtime.     . Multiple Vitamins-Minerals (PRESERVISION AREDS 2 PO) Take 2 capsules by mouth.    . nitroGLYCERIN (NITROSTAT) 0.4 MG SL tablet DISSOLVE 1 TABLET UNDER THE TONGUE EVERY 5 MINUTES FOR 3 DOSES AS NEEDED  FOR  CHEST  PAIN 50 tablet 2  . potassium chloride (K-DUR,KLOR-CON) 10 MEQ tablet TAKE 2 TABLETS EVERY DAY 180 tablet 2  . pramipexole (MIRAPEX) 0.125 MG tablet TAKE 1 TABLET EVERY EVENING 90 tablet 3  . temazepam (RESTORIL) 15 MG capsule Take 15 mg by mouth at bedtime.    . vitamin C (ASCORBIC ACID) 500 MG tablet Take 500 mg by mouth daily.    Marland Kitchen warfarin (COUMADIN) 5 MG tablet TAKE 1/2 TO 1 TABLET DAILY AS DIRECTED BY COUMADIN CLINIC 90 tablet 0   No current facility-administered medications for this visit.     Allergies:   Azithromycin; Compazine [prochlorperazine edisylate]; Demerol; Dolophine [methadone]; Ebastine; Erythromycin; Ibuprofen; Statins; Tetanus toxoids; and Tetracyclines & related    Social History:  The patient  reports that she has never smoked. She has never used smokeless tobacco. She reports that she does not drink alcohol or use drugs.   Family History:  The patient's family history includes Stroke in her father and mother.    ROS: All other systems are reviewed and negative. Unless otherwise mentioned in H&P    PHYSICAL EXAM: VS:  BP 122/68 (BP Location: Left Arm, Patient Position: Sitting, Cuff Size: Normal)   Pulse 66   Ht 5' (1.524 m)   Wt 135 lb (61.2 kg)   BMI 26.37 kg/m  , BMI Body mass index is 26.37 kg/m. GEN: Well nourished, well developed, in no acute distress  HEENT: normal  Neck: no JVD, carotid bruits, or masses Cardiac: IRRR; no murmurs, rubs, or gallops,no edema  Respiratory:  clear to auscultation bilaterally, normal work of breathing GI: soft, nontender, nondistended, + BS MS: no deformity or atrophy  Skin: warm and dry, no rash Neuro:  Strength and sensation are intact Psych: euthymic mood, full affect   EKG:  Atrial fibrillation, heart rate 66 bpm.  Recent Labs: 01/16/2018: ALT 14; BUN 12; Creatinine, Ser 0.81; Hemoglobin 14.2; NT-Pro BNP 1,923; Platelets 238; Potassium 4.4; Sodium 140    Lipid Panel    Component Value Date/Time   CHOL 211 (H) 11/04/2012 1524   TRIG 201.0 (H) 11/04/2012 1524   HDL 66.80 11/04/2012 1524   CHOLHDL 3 11/04/2012 1524   VLDL 40.2 (H) 11/04/2012 1524   LDLCALC 109 (H) 04/16/2012 1043   LDLDIRECT 98.8 11/04/2012 1524      Wt Readings from Last  3 Encounters:  02/03/18 135 lb (61.2 kg)  01/22/18 136 lb (61.7 kg)  01/08/18 136 lb (61.7 kg)      Other studies Reviewed: Echocardiogram 01/31/18 Left ventricle: The cavity size was normal. Wall thickness was   normal. Systolic function was normal. The estimated ejection   fraction was in the range of 60% to 65%. Indeterminant diastolic   function (atrial fibrillation). Wall motion was normal; there   were no regional wall motion abnormalities. - Aortic valve: Trileaflet; moderately calcified leaflets.   Sclerosis without stenosis. There was trivial regurgitation. - Mitral valve: Moderately calcified annulus. There was trivial   regurgitation. - Left atrium: The atrium was moderately dilated. - Right ventricle: The cavity size was normal. Systolic function   was normal. - Right atrium: The atrium was moderately dilated. - Tricuspid valve: Peak RV-RA gradient (S): 37 mm Hg. - Pulmonary arteries: PA peak pressure: 52 mm Hg (S). - Systemic veins: IVC measured 2.5 cm with < 50% respirophasic   variation, suggesting RA pressure 15 mmHg.  Impressions:  - The patient was in atrial fibrillation. Normal LV size with EF   60-65%. Normal RV size and systolic function. Moderate pulmonary   hypertension. Moderate biatrial enlargement.  ASSESSMENT AND PLAN:  1.  Hypertension: I reviewed her vital signs that she is taking at home.  She has significantly low blood pressure recordings in the morning which improves  throughout the day.  She is on isosorbide mononitrate 90 mg at at bedtime.  I will reduce this to 60 mg and have her take it in the morning.  This should help with blood pressure control and hypotension in the a.m. and hopefully increase her stamina without having to have lower blood pressures.  If she continues to have low pressures during the daytime we may have her go back to taking it at nighttime.  2.  Chronic diastolic heart failure with chronic lower extremity edema: Lasix dosing has helped with this and she has no further complaints.  3.  Abdominal discomfort with exertion: If persists may have her see GI, or begin PPI.  Her symptoms are vague concerning her discomfort.  She states that her appetite is good and she does not have any trouble with constipation or early satiety.  We will continue to follow-up on this or can be followed by PCP.  4.  Atrial fibrillation: Heart rate is currently well controlled on atenolol.  She is not on anticoagulation therapy due to her age and frailty.  Current medicines are reviewed at length with the patient today.    Labs/ tests ordered today include: None   Phill Myron. West Pugh, ANP, AACC   02/03/2018 1:38 PM    [facility] [address] [place], [place] [address] Phone: [phone]; Fax: [phone]

## 2018-02-03 ENCOUNTER — Ambulatory Visit: Payer: Medicare HMO | Admitting: Adult Health

## 2018-02-03 ENCOUNTER — Ambulatory Visit (INDEPENDENT_AMBULATORY_CARE_PROVIDER_SITE_OTHER): Payer: Medicare HMO | Admitting: Pharmacist

## 2018-02-03 ENCOUNTER — Encounter: Payer: Self-pay | Admitting: Adult Health

## 2018-02-03 VITALS — BP 122/68 | HR 66 | Ht 60.0 in | Wt 135.0 lb

## 2018-02-03 DIAGNOSIS — I10 Essential (primary) hypertension: Secondary | ICD-10-CM

## 2018-02-03 DIAGNOSIS — I272 Pulmonary hypertension, unspecified: Secondary | ICD-10-CM | POA: Diagnosis not present

## 2018-02-03 DIAGNOSIS — I482 Chronic atrial fibrillation, unspecified: Secondary | ICD-10-CM

## 2018-02-03 DIAGNOSIS — Z5181 Encounter for therapeutic drug level monitoring: Secondary | ICD-10-CM

## 2018-02-03 DIAGNOSIS — I5032 Chronic diastolic (congestive) heart failure: Secondary | ICD-10-CM | POA: Diagnosis not present

## 2018-02-03 DIAGNOSIS — I4891 Unspecified atrial fibrillation: Secondary | ICD-10-CM | POA: Diagnosis not present

## 2018-02-03 LAB — POCT INR: INR: 2 (ref 2.0–3.0)

## 2018-02-03 MED ORDER — ISOSORBIDE MONONITRATE ER 60 MG PO TB24: 60.0000 mg | ORAL_TABLET | Freq: Every day | ORAL | 1 refills | Status: DC

## 2018-02-03 NOTE — Patient Instructions (Signed)
Medication Instructions:  DECREASE ISOSORBIDE 60MG  DAILY (1TAB)  If you need a refill on your cardiac medications before your next appointment, please call your pharmacy.  Follow-Up: Your physician wants you to follow-up in: [place] [address] [place].  Thank you for choosing [facility] at [facility]!!

## 2018-02-04 ENCOUNTER — Other Ambulatory Visit: Payer: Self-pay | Admitting: Cardiovascular Disease

## 2018-02-04 NOTE — Telephone Encounter (Signed)
Rx request sent to pharmacy.  

## 2018-02-05 ENCOUNTER — Other Ambulatory Visit: Payer: Self-pay | Admitting: *Deleted

## 2018-02-05 NOTE — Patient Outreach (Signed)
[place] [facility]) Care Management   02/05/2018  Kristen Whitehead 06-01-1923 [id number]  Kristen Whitehead is an 84 y.o. female  Subjective:   Member alert and oriented x3,denies pain but does complain of some abdominal discomfort.  State she frequently feels bloated when eating small meals and often wake up with diarrhea.  Report pain across the top of her abdomen with as well.  Denies notifying MD regarding symptoms, but has Fiber powder ordered.  Objective:   Review of Systems  Constitutional: Negative.   HENT: Negative.   Eyes: Negative.   Respiratory: Negative.   Cardiovascular: Negative.   Gastrointestinal: Positive for abdominal pain and diarrhea.  Genitourinary: Negative.   Musculoskeletal: Negative.   Skin: Negative.   Neurological: Negative.   Endo/Heme/Allergies: Negative.   Psychiatric/Behavioral: Negative.     Physical Exam  Constitutional: She is oriented to person, place, and time. She appears well-developed and well-nourished.  Neck: Normal range of motion.  Respiratory: Effort normal and breath sounds normal.  GI: Soft. Bowel sounds are normal. There is tenderness.  Musculoskeletal: Normal range of motion.  Neurological: She is alert and oriented to person, place, and time.  Skin: Skin is warm and dry.   BP 118/72 (BP Location: Right Arm, Patient Position: Sitting, Cuff Size: Normal)   Pulse 68   Resp 18   Wt 133 lb (60.3 kg)   SpO2 94%   BMI 25.97 kg/m   Encounter Medications:   Outpatient Encounter Medications as of 02/05/2018  Medication Sig Note  . atenolol (TENORMIN) 25 MG tablet Take 1 tablet (25 mg total) by mouth daily.   . calcium carbonate (OS-CAL - DOSED IN MG OF ELEMENTAL CALCIUM) 1250 MG tablet Take 1 tablet by mouth 2 (two) times daily.    . Cholecalciferol (VITAMIN D) 2000 UNITS tablet Take 2,000 Units by mouth daily.    . cycloSPORINE (RESTASIS) 0.05 % ophthalmic emulsion Place 1 drop into both eyes 2 (two) times  daily.   . dorzolamide (TRUSOPT) 2 % ophthalmic solution Place 1 drop into the left eye 2 (two) times daily.   Marland Kitchen estradiol (ESTRACE VAGINAL) 0.1 MG/GM vaginal cream Place 2 g vaginally once a week.  08/01/2014: .   Marland Kitchen Fiber POWD Take 1 Dose by mouth daily.   . furosemide (LASIX) 40 MG tablet Take 80 mg by mouth 2 (two) times daily.    . isosorbide mononitrate (IMDUR) 60 MG 24 hr tablet Take 1 tablet (60 mg total) by mouth daily.   Marland Kitchen latanoprost (XALATAN) 0.005 % ophthalmic solution Place 1 drop into both eyes at bedtime.    Marland Kitchen levothyroxine (SYNTHROID, LEVOTHROID) 100 MCG tablet Take 100 mcg by mouth daily.    . Magnesium 500 MG TABS Take 800 mg by mouth at bedtime.    . Multiple Vitamins-Minerals (PRESERVISION AREDS 2 PO) Take 2 capsules by mouth.   . nitroGLYCERIN (NITROSTAT) 0.4 MG SL tablet DISSOLVE 1 TABLET UNDER THE TONGUE EVERY 5 MINUTES FOR 3 DOSES AS NEEDED  FOR  CHEST  PAIN   . potassium chloride (K-DUR,KLOR-CON) 10 MEQ tablet TAKE 2 TABLETS EVERY DAY   . pramipexole (MIRAPEX) 0.125 MG tablet TAKE 1 TABLET EVERY EVENING   . temazepam (RESTORIL) 15 MG capsule Take 15 mg by mouth at bedtime.   . vitamin C (ASCORBIC ACID) 500 MG tablet Take 500 mg by mouth daily.   Marland Kitchen warfarin (COUMADIN) 5 MG tablet TAKE 1/2 TO 1 TABLET DAILY AS DIRECTED BY COUMADIN CLINIC  No facility-administered encounter medications on file as of 02/05/2018.     Functional Status:   In your present state of health, do you have any difficulty performing the following activities: 01/08/2018  Hearing? N  Vision? Y  Difficulty concentrating or making decisions? Y  Walking or climbing stairs? Y  Comment Need a rail  Dressing or bathing? N  Doing errands, shopping? N  Preparing Food and eating ? N  Using the Toilet? N  In the past six months, have you accidently leaked urine? Y  Do you have problems with loss of bowel control? N  Managing your Medications? N  Managing your Finances? N  Housekeeping or managing  your Housekeeping? Y  Some recent data might be hidden    Fall/Depression Screening:    Fall Risk  01/22/2018 01/08/2018 07/24/2016  Falls in the past year? No Yes No  Number falls in past yr: - 1 -  Injury with Fall? - No -  Risk for fall due to : - History of fall(s) -  Follow up - Falls prevention discussed -   PHQ 2/9 Scores 01/08/2018 12/16/2017  PHQ - 2 Score 0 0    Assessment:    Met with member at scheduled time.  She denies any shortness of breath, state her biggest concern at this time is the abdominal discomfort.  Although she has Fiber powder, she is not taking as prescribed, but instead taking fiber gummies periodically.  She agrees that she will need to be seen by her primary MD for evaluation.  Report taking diuretic as instructed, denies complications.  Visit with cardiologist complete, Isorsobide dose changed, she denies any concern, report taking correct dose.  Denies any urgent concerns, advised to contact this care manager with questions.  Plan:   Will follow up with member next month.  Will consider transition to health coach if stable.  THN CM Care Plan Problem One     Most Recent Value  Care Plan Problem One  Knowledge deficit regarding management of heart failure   Role Documenting the Problem One  Care Management Coordinator  Care Plan for Problem One  Not Active  THN Long Term Goal   Member will verbalize understanding of heart failure action plan/zones within the next 31 days  THN Long Term Goal Start Date  01/08/18  [facility] Long Term Goal Met Date  02/05/18  [facility] CM Short Term Goal #1   Member will report taking and recording daily weights over the next 4 weeks  THN CM Short Term Goal #1 Start Date  01/08/18  [facility] CM Short Term Goal #1 Met Date  02/05/18  THN CM Short Term Goal #2   Member will report decrease in high salt foods over the next 4 weeks  THN CM Short Term Goal #2 Start Date  01/08/18  [facility] CM Short Term Goal #2 Met Date  02/05/18    [facility] CM Care  Plan Problem Two     Most Recent Value  Care Plan Problem Two  Risk for ED related to abdominal pain/discomfort  Role Documenting the Problem Two  Care Management Coordinator  Care Plan for Problem Two  Active  Interventions for Problem Two Long Term Goal   Educated on use of ED versus going to MD office  [place] Goal  Member will not have any ED visits within the next 31 days  THN Long Term Goal Start Date  02/05/18  [facility] CM Short Term Goal #1  Member will report decrease abdominal discomfort within the next 4 weeks  THN CM Short Term Goal #1 Start Date  02/05/18  Interventions for Short Term Goal #2   Member educated on use of Fiber powder per MD orders.  Advised of importance of taking as prescribed  THN CM Short Term Goal #2   Member will keep and attend appointment with primary MD within the next 2 weeks  THN CM Short Term Goal #2 Start Date  02/05/18  Interventions for Short Term Goal #2  Call placed to MD office to report concerns of abdominal discomfort and obtain appointment.  Reminder written on calendar     Valente David, [place], MSN [place] Manager [phone]

## 2018-02-11 DIAGNOSIS — N8182 Incompetence or weakening of pubocervical tissue: Secondary | ICD-10-CM | POA: Diagnosis not present

## 2018-02-18 DIAGNOSIS — E890 Postprocedural hypothyroidism: Secondary | ICD-10-CM | POA: Diagnosis not present

## 2018-02-18 DIAGNOSIS — K219 Gastro-esophageal reflux disease without esophagitis: Secondary | ICD-10-CM | POA: Diagnosis not present

## 2018-02-18 DIAGNOSIS — R5382 Chronic fatigue, unspecified: Secondary | ICD-10-CM | POA: Diagnosis not present

## 2018-02-23 ENCOUNTER — Ambulatory Visit (INDEPENDENT_AMBULATORY_CARE_PROVIDER_SITE_OTHER): Payer: Medicare HMO | Admitting: Pharmacist Clinician (PhC)/ Clinical Pharmacy Specialist

## 2018-02-23 ENCOUNTER — Other Ambulatory Visit: Payer: Self-pay | Admitting: Cardiovascular Disease

## 2018-02-23 DIAGNOSIS — I482 Chronic atrial fibrillation, unspecified: Secondary | ICD-10-CM

## 2018-02-23 DIAGNOSIS — Z5181 Encounter for therapeutic drug level monitoring: Secondary | ICD-10-CM | POA: Diagnosis not present

## 2018-02-23 LAB — POCT INR: INR: 2.8 (ref 2.0–3.0)

## 2018-03-06 ENCOUNTER — Other Ambulatory Visit: Payer: Self-pay | Admitting: *Deleted

## 2018-03-06 NOTE — Patient Outreach (Addendum)
[facility] [facility]) Care Management  03/06/2018  JOVIE SWANNER 1922/09/09 [id number]   Call placed to member to follow up on current health condition.  She report she is doing well, denies chest pain or discomfort, denies shortness of breath.  State she has been compliant with medications, weight has been stable.  She denies any urgent concerns at this time.  This care manager discussed transition to health coach for continued management of heart failure, she agrees.  Will notify primary MD of transition and place order.   THN CM Care Plan Problem Two     Most Recent Value  Care Plan Problem Two  Risk for ED related to abdominal pain/discomfort  Role Documenting the Problem Two  Care Management [place] for Problem Two  Active  THN Long Term Goal  Member will not have any ED visits within the next 31 days  THN Long Term Goal Start Date  02/05/18  [facility] Long Term Goal Met Date  03/06/18  THN CM Short Term Goal #1   Member will report decrease abdominal discomfort within the next 4 weeks  THN CM Short Term Goal #1 Start Date  02/05/18  [facility] CM Short Term Goal #1 Met Date   03/06/18  [facility] CM Short Term Goal #2   Member will keep and attend appointment with primary MD within the next 2 weeks  THN CM Short Term Goal #2 Start Date  02/05/18  [facility] CM Short Term Goal #2 Met Date  03/06/18      Valente David, RN, MSN [place] Manager [phone]

## 2018-03-10 ENCOUNTER — Other Ambulatory Visit: Payer: Self-pay | Admitting: Neurology

## 2018-03-10 ENCOUNTER — Telehealth: Payer: Self-pay | Admitting: Cardiovascular Disease

## 2018-03-10 DIAGNOSIS — G2581 Restless legs syndrome: Secondary | ICD-10-CM

## 2018-03-10 MED ORDER — PRAMIPEXOLE DIHYDROCHLORIDE 0.125 MG PO TABS: 0.1250 mg | ORAL_TABLET | Freq: Every evening | ORAL | 3 refills | Status: DC

## 2018-03-10 MED ORDER — FUROSEMIDE 20 MG PO TABS: 80.0000 mg | ORAL_TABLET | Freq: Two times a day (BID) | ORAL | 0 refills | Status: DC

## 2018-03-10 NOTE — Addendum Note (Signed)
Addended by: Belinda Block A on: 03/10/2018 11:53 AM   Modules accepted: Orders

## 2018-03-10 NOTE — Telephone Encounter (Signed)
Pt is calling re: her being in need of a refill on her pramipexole (MIRAPEX) 0.125 MG tablet.  Pt states [employer] informed her they have made several attempts in getting a refill on her pramipexole (MIRAPEX) 0.125 MG tablet.  Pt is asking for a refill on the pramipexole (MIRAPEX) 0.125 MG tablet to be sent to  [place], [place] [phone] (Phone) [phone] (Fax)

## 2018-03-10 NOTE — Telephone Encounter (Signed)
Rx refill sent to [employer]. 

## 2018-03-10 NOTE — Telephone Encounter (Signed)
New Message       *STAT* If patient is at the pharmacy, call can be transferred to refill team.   1. Which medications need to be refilled? (please list name of each medication and dose if known) Furosemide 20 mg 4 am 4 pm   2. Which pharmacy/location (including street and city if local pharmacy) is medication to be sent to?[place], [employer] [phone]  3. Do they need a 30 day or 90 day supply? 2 week supply   Patient states that her Rx has been increased. Per Dr. Oval Linsey.

## 2018-03-11 DIAGNOSIS — N8182 Incompetence or weakening of pubocervical tissue: Secondary | ICD-10-CM | POA: Diagnosis not present

## 2018-03-19 ENCOUNTER — Other Ambulatory Visit: Payer: Self-pay

## 2018-03-19 MED ORDER — FUROSEMIDE 40 MG PO TABS: 80.0000 mg | ORAL_TABLET | Freq: Two times a day (BID) | ORAL | 1 refills | Status: DC

## 2018-03-23 ENCOUNTER — Ambulatory Visit (INDEPENDENT_AMBULATORY_CARE_PROVIDER_SITE_OTHER): Payer: Medicare HMO | Admitting: Pharmacist

## 2018-03-23 ENCOUNTER — Ambulatory Visit: Payer: Medicare HMO | Admitting: Cardiovascular Disease

## 2018-03-23 ENCOUNTER — Encounter: Payer: Self-pay | Admitting: Cardiovascular Disease

## 2018-03-23 VITALS — BP 138/81 | HR 80 | Ht 59.0 in | Wt 134.4 lb

## 2018-03-23 DIAGNOSIS — I5032 Chronic diastolic (congestive) heart failure: Secondary | ICD-10-CM | POA: Diagnosis not present

## 2018-03-23 DIAGNOSIS — I482 Chronic atrial fibrillation, unspecified: Secondary | ICD-10-CM

## 2018-03-23 DIAGNOSIS — I480 Paroxysmal atrial fibrillation: Secondary | ICD-10-CM | POA: Diagnosis not present

## 2018-03-23 DIAGNOSIS — Z5181 Encounter for therapeutic drug level monitoring: Secondary | ICD-10-CM | POA: Diagnosis not present

## 2018-03-23 DIAGNOSIS — I10 Essential (primary) hypertension: Secondary | ICD-10-CM | POA: Diagnosis not present

## 2018-03-23 DIAGNOSIS — I6523 Occlusion and stenosis of bilateral carotid arteries: Secondary | ICD-10-CM

## 2018-03-23 LAB — POCT INR: INR: 3.2 — AB (ref 2.0–3.0)

## 2018-03-23 NOTE — Progress Notes (Signed)
Cardiology Office Note   Date:  03/23/2018   ID:  Whitehead, Kristen [birth year], MRN [id number]  PCP:  Kristen Kid, MD  Cardiologist:   [name], MD   No chief complaint on file.     History of Present Illness: Kristen Whitehead is a 84 y.o. female with chronic atrial fibrillation, hypertension, hyperlipidemia, mild MR and AR and chronic diastolic heart failure who presents for follow up.  Kristen Whitehead was previously a patient of Dr. Mare Whitehead.  She last saw him 08/2015 for follow-up on frequent episodes of chest pain. She had been referred for stress testing but declined, as she has not tolerated Lexiscan well in the past. Troponin was checked and was negative. This is been a chronic problem so further evaluation was deferred.  Her stress test in 2013 was negative for ischemia.  She reports having "heart attacks" that occur when she overexerts herself.  When this happens she gets pain across her chest that is associated with shortness of breath.  She denies lower extremity edema, orthopnea or PND.  She watched a documentary on broken heart syndrome and thinks this must be what happens to her.  When she gets the pain she prays for God to take it away and eventually the pain subsides.  There is no associated nausea or diaphoresis.  Imdur was increased to 90 mg daily on 10/2015.  Since then she had an episode of heart failure 01/2016 that responded to increased lasix.  She also has chronic exertional dyspnea.   Kristen Whitehead saw Kristen Flake, DNP, on 12/11/16.  Lasix was increased to 60 mg in the morning and 40 in the afternoon.  There was no change in her symptoms.  At her last appointment Ms. Howeth was volume overloaded so lasix was increased. She followed up with  Kristen Flake, DNP on 01/2018 and her energy was worse but her BP was better.  She felt better with physical activity.  Her BP was low in the morning so Imdur was decreased to 60mg  and she started taking it in  the morning instead of the afternoon.  She notices that her energy levels have been better.  Her blood pressure is somewhat labile but overall well-controlled.  Some shortness of breath with exertion but rest when this occurs and it improves.  She is not interested in further testing.  She has had Life[facility] testing in the past and was told that she had mild blockage in her carotids.  She denies any vision changes, numbness, weakness, or slurred speech.  She is wondering if she should continue to have this tested each year.  Stable but sometimes fluctuates up to 3 pounds in a day.  It typically improves by the following day without making her furosemide.  Lately she has had less of an appetite.   Past Medical History:  Diagnosis Date  . Bladder prolapse, female, acquired   . Cancer ([place])    thyroid  . Chest pain    no known ischemic heart disease; negative Myoview July 2013. EF 75% with no ischemia.   . Chronic anticoagulation   . Chronic atrial fibrillation (HCC)    managed with rate control and coumadin  . Chronic diastolic heart failure ([place])   . GERD (gastroesophageal reflux disease)   . Heart disease   . History of congenital mitral regurgitation    mild  . History of mitral valve prolapse 06/15/2008   a. echo 1/14: mild LVH, EF 60-65%, mild MR,  mild to mod BAE, PASP 35  . Hypercholesterolemia   . Hypertension   . Hypothyroidism     Past Surgical History:  Procedure Laterality Date  . ABDOMINAL HYSTERECTOMY    . CATARACT EXTRACTION    . CHOLECYSTECTOMY    . Thyroidectomy       Current Outpatient Medications  Medication Sig Dispense Refill  . atenolol (TENORMIN) 25 MG tablet Take 1 tablet (25 mg total) by mouth daily. 90 tablet 0  . calcium carbonate (OS-CAL - DOSED IN MG OF ELEMENTAL CALCIUM) 1250 MG tablet Take 1 tablet by mouth 2 (two) times daily.     . Cholecalciferol (VITAMIN D) 2000 UNITS tablet Take 2,000 Units by mouth daily.     . cycloSPORINE (RESTASIS) 0.05 %  ophthalmic emulsion Place 1 drop into both eyes 2 (two) times daily.    . dorzolamide (TRUSOPT) 2 % ophthalmic solution Place 1 drop into the left eye 2 (two) times daily.    Marland Kitchen estradiol (ESTRACE VAGINAL) 0.1 MG/GM vaginal cream Place 2 g vaginally once a week.     . Fiber POWD Take 1 Dose by mouth daily.    . furosemide (LASIX) 40 MG tablet Take 2 tablets (80 mg total) by mouth 2 (two) times daily. 360 tablet 1  . isosorbide mononitrate (IMDUR) 60 MG 24 hr tablet Take 1 tablet (60 mg total) by mouth daily. 30 tablet 1  . latanoprost (XALATAN) 0.005 % ophthalmic solution Place 1 drop into both eyes at bedtime.     Marland Kitchen levothyroxine (SYNTHROID, LEVOTHROID) 100 MCG tablet Take 100 mcg by mouth daily.     . Magnesium 500 MG TABS Take 800 mg by mouth at bedtime.     . Multiple Vitamins-Minerals (PRESERVISION AREDS 2 PO) Take 2 capsules by mouth.    . nitroGLYCERIN (NITROSTAT) 0.4 MG SL tablet DISSOLVE 1 TABLET UNDER THE TONGUE EVERY 5 MINUTES FOR 3 DOSES AS NEEDED  FOR  CHEST  PAIN 50 tablet 2  . potassium chloride (K-DUR,KLOR-CON) 10 MEQ tablet TAKE 2 TABLETS EVERY DAY 180 tablet 2  . pramipexole (MIRAPEX) 0.125 MG tablet Take 1 tablet (0.125 mg total) by mouth every evening. 90 tablet 3  . temazepam (RESTORIL) 15 MG capsule Take 15 mg by mouth at bedtime.    . vitamin C (ASCORBIC ACID) 500 MG tablet Take 500 mg by mouth daily.    Marland Kitchen warfarin (COUMADIN) 5 MG tablet TAKE 1/2 TO 1 TABLET DAILY AS DIRECTED BY COUMADIN CLINIC 90 tablet 0   No current facility-administered medications for this visit.     Allergies:   Azithromycin; Compazine [prochlorperazine edisylate]; Demerol; Dolophine [methadone]; Ebastine; Erythromycin; Ibuprofen; Statins; Tetanus toxoids; and Tetracyclines & related    Social History:  The patient  reports that she has never smoked. She has never used smokeless tobacco. She reports that she does not drink alcohol or use drugs.   Family History:  The patient's family history  includes Stroke in her father and mother.    ROS:  Please see the history of present illness.   Otherwise, review of systems are positive for none.   All other systems are reviewed and negative.    PHYSICAL EXAM: VS:  BP 138/81   Pulse 80   Ht 4\' 11"  (1.499 m)   Wt 134 lb 6.4 oz (61 kg)   SpO2 94%   BMI 27.15 kg/m  , BMI Body mass index is 27.15 kg/m. GENERAL:  Well appearing HEENT: Pupils equal round and reactive,  fundi not visualized, oral mucosa unremarkable NECK:  No jugular venous distention, waveform within normal limits, carotid upstroke brisk and symmetric, no bruits LUNGS:  Clear to auscultation bilaterally HEART:  Irregularly irregular.  PMI not displaced or sustained,S1 and S2 within normal limits, no S3, no S4, no clicks, no rubs, no murmurs ABD:  Flat, positive bowel sounds normal in frequency in pitch, no bruits, no rebound, no guarding, no midline pulsatile mass, no hepatomegaly, no splenomegaly EXT:  2 plus pulses throughout, no edema, no cyanosis no clubbing SKIN:  No rashes no nodules NEURO:  Cranial nerves II through XII grossly intact, motor grossly intact throughout PSYCH:  Cognitively intact, oriented to person place and time   EKG:  EKG is not ordered today. 05/06/16: Atrial fibrillation rate 72 bpm  Echo 11/11/14: Study Conclusions  - Left ventricle: The cavity size was normal. Wall thickness was normal. Systolic function was normal. The estimated ejection fraction was in the range of 60% to 65%. - Aortic valve: There was mild regurgitation. - Mitral valve: Calcified annulus. Mildly thickened leaflets . There was mild regurgitation. - Left atrium: The atrium was mildly dilated. - Right atrium: The atrium was mildly dilated.  Recent Labs: 01/16/2018: ALT 14; BUN 12; Creatinine, Ser 0.81; Hemoglobin 14.2; NT-Pro BNP 1,923; Platelets 238; Potassium 4.4; Sodium 140    Lipid Panel    Component Value Date/Time   CHOL 211 (H) 11/04/2012 1524    TRIG 201.0 (H) 11/04/2012 1524   HDL 66.80 11/04/2012 1524   CHOLHDL 3 11/04/2012 1524   VLDL 40.2 (H) 11/04/2012 1524   LDLCALC 109 (H) 04/16/2012 1043   LDLDIRECT 98.8 11/04/2012 1524   03/28/16: Sodium 136, potassium 4.1, BUN 17, creatinine 0.74 AST 24, ALT 17 WBC 10, hemoglobin 15.7, hematocrit 46.8, platelets 291  07/21/15: TSH 1.52, free T4 1 0.25  04/13/15: Total cholesterol 235, triglycerides 150, HDL 66, LDL 139  03/08/15: BNP 601  Wt Readings from Last 3 Encounters:  03/23/18 134 lb 6.4 oz (61 kg)  02/05/18 133 lb (60.3 kg)  02/03/18 135 lb (61.2 kg)      ASSESSMENT AND PLAN:  # Chest pain: Resolved.     # Chronic atrial fibrillation: Rate remains well-controlled.  Continue atenolol and warfarin.   This patients CHA2DS2-VASc Score and unadjusted Ischemic Stroke Rate (% per year) is equal to 4.8 % stroke rate/year from a score of 4  Above score calculated as 1 point each if present [CHF, HTN, DM, Vascular=MI/PAD/Aortic Plaque, Age if 65-74, or Female] Above score calculated as 2 points each if present [Age > 75, or Stroke/TIA/TE]   # Chronic diastolic heart failure: Ms. Gluth is euvolemic on exam today.  Continue atenolol, furosemide, and isosorbide mononitrate.  # Carotid Dopplers:  She has a report of moderate carotid stenosis in the past.  She will get lipids checked with her PCP later this month and we will check carotid Dopplers.  I don't hear a bruit on exam.  # Hypertension: BP is well controlled.  Given her age her goal is less than 140/90.  Current medicines are reviewed at length with the patient today.  The patient does not have concerns regarding medicines.  The following changes have been made:  none  Labs/ tests ordered today include:   No orders of the defined types were placed in this encounter.    Disposition:   FU with Zeina Akkerman C. Oval Linsey, MD, [facility] in 6 months.     Signed, Cheris Tweten C. Oval Linsey, MD,  [facility]  03/23/2018 4:27 PM    [facility]

## 2018-03-23 NOTE — Patient Instructions (Signed)
Medication Instructions:  Your physician recommends that you continue on your current medications as directed. Please refer to the Current Medication list given to you today.  Labwork: HAVE YOUR PRIMARY CARE FAX DR [place] YOUR LABS AFTER YOU HAVE THEM NEXT MONTH  Testing/Procedures: Your physician has requested that you have a carotid duplex. This test is an ultrasound of the carotid arteries in your neck. It looks at blood flow through these arteries that supply the brain with blood. Allow one hour for this exam. There are no restrictions or special instructions.  Follow-Up: Your physician wants you to follow-up in: 6 MONTHS You will receive a reminder letter in the mail two months in advance. If you don't receive a letter, please call our office to schedule the follow-up appointment.  If you need a refill on your cardiac medications before your next appointment, please call your pharmacy.

## 2018-03-25 ENCOUNTER — Other Ambulatory Visit: Payer: Self-pay | Admitting: Cardiovascular Disease

## 2018-03-25 ENCOUNTER — Other Ambulatory Visit: Payer: Self-pay | Admitting: Adult Health

## 2018-03-26 ENCOUNTER — Telehealth: Payer: Self-pay | Admitting: *Deleted

## 2018-03-26 MED ORDER — ATENOLOL 25 MG PO TABS: 25.0000 mg | ORAL_TABLET | Freq: Every day | ORAL | 1 refills | Status: DC

## 2018-03-26 NOTE — Telephone Encounter (Signed)
Patient left a msg on the refill vm stating that that she needs a refill on atenolol. She states that the last refill was sent in incorrectly as she has been taking this medication at 25 mg bid for a while but it was refilled at 25 mg qd. She would like a call back at [phone]. Thanks, MI

## 2018-03-27 ENCOUNTER — Telehealth: Payer: Self-pay | Admitting: Neurology

## 2018-03-27 ENCOUNTER — Other Ambulatory Visit: Payer: Self-pay | Admitting: Neurology

## 2018-03-27 ENCOUNTER — Other Ambulatory Visit: Payer: Self-pay

## 2018-03-27 DIAGNOSIS — G2581 Restless legs syndrome: Secondary | ICD-10-CM

## 2018-03-27 MED ORDER — PRAMIPEXOLE DIHYDROCHLORIDE 0.125 MG PO TABS: 0.1250 mg | ORAL_TABLET | Freq: Every evening | ORAL | 0 refills | Status: DC

## 2018-03-27 NOTE — Patient Outreach (Signed)
[facility]) Care Management  03/27/2018  Kristen Whitehead 1922-09-27 [id number]    1st telephone call to the patient for initial assessment. Patient able to verify HIPAA.  Explained to the patient health coach role and [facility] services.  Patient is open to the call and verbally agreed to services.  The patient stated that she had just woke up and would like for me to call her later to complete the initial assessment.    Plan: RN Health Coach will make another outreach attempt to the patient within the next four business days.  Lazaro Arms RN, BSN, [place] Direct Dial:  [phone]  Fax: [phone]

## 2018-03-27 NOTE — Telephone Encounter (Signed)
Pt said she cannot find the bottle for pramipexole (MIRAPEX) 0.125 MG tablet. 30 day supply. She is not sure if she thru it away or forgot to order it from [facility]. She is asking for a refill sent to [place] [place], [place] today please. She is aware the clinic closes at noon today but she needs this sent in today

## 2018-03-27 NOTE — Telephone Encounter (Signed)
A 30 day supply sent to the pharmacy on file for the pt.

## 2018-03-31 ENCOUNTER — Telehealth: Payer: Self-pay | Admitting: Cardiology

## 2018-03-31 NOTE — Telephone Encounter (Signed)
Patient called stating that she forgot to take her Atenolol this am because her grandson was there spending the night.  She started feeling bad and her BP was 105/63mmHg.  She wanted to know what to do about her BP med.  I instructed her not to take her Atenolol tonight and check BP again in the am and call back to the office.

## 2018-04-01 ENCOUNTER — Telehealth: Payer: Self-pay | Admitting: Cardiovascular Disease

## 2018-04-01 NOTE — Telephone Encounter (Signed)
Returned call to patient of Dr. Oval Linsey. She did not take any atenolol yesterday - forgot in the AM and BP was low 105/59 and HR 81 - and she called on-call provider who advised no PM atenolol. She had no energy yesterday and her BP was low all day. This AM she reports her BP was 154/114 and HR 141 - she took atenolol. EMS came to her house to assess her and after this her BP was 128/88 and HR 78. She has no current complaints. She does reports that her BP has been lower than her normal for a few days (124/66 HR 73 on Monday, 118/76 on Saturday).   No dietary changes, however she does forget to eat as she is not hungry. She feels full easily when eating. She states she misses meals and would like a dietary supplement - suggested ensure or boost.  Advised patient to continue to track BP/HR at least twice daily. If continued low readings, meds may need to be adjusted.   Advised would route to MD (per request) to review and advise further

## 2018-04-01 NOTE — Telephone Encounter (Signed)
New Message   Pt c/o medication issue:  1. Name of Medication: atenolol (TENORMIN) 25 MG tablet  2. How are you currently taking this medication (dosage and times per day)? Take 1 tablet (25 mg total) by mouth daily.   3. Are you having a reaction (difficulty breathing--STAT)? no  4. What is your medication issue? Pt states that for the past couple of days her bp has been low so yesterday it was 105/59 and she called her pcp on call and they told her not to take her bp medication, now today its 154/114 with hr of 141 and Oxygen level is 92. Pt wants to know what she should do from here on

## 2018-04-01 NOTE — Telephone Encounter (Signed)
Agree with these recommendations.

## 2018-04-02 ENCOUNTER — Other Ambulatory Visit: Payer: Self-pay

## 2018-04-02 NOTE — Patient Outreach (Addendum)
[facility]) Care Management  04/02/2018   Kristen Whitehead December 01, 1922 [id number]      Outreach attempt # 2 to the patient for initial assessment.  Patient able to verify HIPAA.  Explained to the patient health coach role and [facility] services.  Patient is open to the call and verbally agreed to services. The patient was able to complete the assessment.  Social: The patient lives in the home alone.  The patient states that she is able to perform her ADLS/IADLS independently.  She drives herself to appointments. The patient's durable medical equipment in the home include a scale, blood pressure cuff, cane and eyeglasses.  Conditions: Per chart review the patients PMH includes A-Fib, HTN, CHF, Hypothyroidism,and Hypercholesterolemia.  The patient denies any pain, weakness, swelling  or shortness of breath.   She reports she did have a fall this year she slipped of the edge of a rolling chair but did not injure her self.  She states that she has removed the chair from the home. I discussed fall prevention and she verbalized understanding.  The patient states that her weight today was 132 her blood pressure today was 133/77 pulse 71.  She did report that she has been having problems with her blood pressure being low. She states that when her blood pressure is low she feels drained. She states that she will not take her medication when it is low and has called her doctors office concerning the issue. The patient states that she follows a low salt diet.  She does not eat that much and when she is tired does not feel like making food. She wouldlike information about programs like meals on wheels.  I advised the patient that I will put in a referral for social to help her.  The patient verbalized understanding.   Medications: The patient states that she takes around 19 medications.  She states she is able to manage her medications and pay for them.   Advanced Directives: The patient states  that she has an advance directive.  I asked the patient to give a copy to her doctors office.  She verbalized understanding.   Current Medications:  Current Outpatient Medications  Medication Sig Dispense Refill  . atenolol (TENORMIN) 25 MG tablet Take 1 tablet (25 mg total) by mouth daily. 180 tablet 1  . calcium carbonate (OS-CAL - DOSED IN MG OF ELEMENTAL CALCIUM) 1250 MG tablet Take 1 tablet by mouth 2 (two) times daily.     . Cholecalciferol (VITAMIN D) 2000 UNITS tablet Take 2,000 Units by mouth daily.     . cycloSPORINE (RESTASIS) 0.05 % ophthalmic emulsion Place 1 drop into both eyes 2 (two) times daily.    . dorzolamide (TRUSOPT) 2 % ophthalmic solution Place 1 drop into the left eye 2 (two) times daily.    Marland Kitchen estradiol (ESTRACE VAGINAL) 0.1 MG/GM vaginal cream Place 2 g vaginally once a week.     . Fiber POWD Take 1 Dose by mouth daily.    . furosemide (LASIX) 40 MG tablet Take 2 tablets (80 mg total) by mouth 2 (two) times daily. 360 tablet 1  . isosorbide mononitrate (IMDUR) 60 MG 24 hr tablet Take 1 tablet (60 mg total) by mouth daily. 30 tablet 1  . latanoprost (XALATAN) 0.005 % ophthalmic solution Place 1 drop into both eyes at bedtime.     Marland Kitchen levothyroxine (SYNTHROID, LEVOTHROID) 100 MCG tablet Take 100 mcg by mouth daily.     . Magnesium  500 MG TABS Take 800 mg by mouth at bedtime.     . Multiple Vitamins-Minerals (PRESERVISION AREDS 2 PO) Take 2 capsules by mouth.    . potassium chloride (K-DUR) 10 MEQ tablet TAKE TWO TABLETS BY MOUTH EVERY DAY 180 tablet 3  . pramipexole (MIRAPEX) 0.125 MG tablet Take 1 tablet (0.125 mg total) by mouth every evening. 30 tablet 0  . ranitidine (ZANTAC) 150 MG tablet Take 150 mg by mouth daily.    . temazepam (RESTORIL) 15 MG capsule Take 15 mg by mouth at bedtime.    Marland Kitchen warfarin (COUMADIN) 5 MG tablet TAKE 1/2 TO 1 TABLET DAILY AS DIRECTED BY COUMADIN CLINIC 90 tablet 0  . nitroGLYCERIN (NITROSTAT) 0.4 MG SL tablet DISSOLVE 1 TABLET UNDER THE  TONGUE EVERY 5 MINUTES FOR 3 DOSES AS NEEDED  FOR  CHEST  PAIN (Patient not taking: Reported on 04/02/2018) 50 tablet 2  . potassium chloride (K-DUR,KLOR-CON) 10 MEQ tablet TAKE 2 TABLETS EVERY DAY 180 tablet 2  . vitamin C (ASCORBIC ACID) 500 MG tablet Take 500 mg by mouth daily.     No current facility-administered medications for this visit.     Functional Status:  In your present state of health, do you have any difficulty performing the following activities: 04/02/2018 01/08/2018  Hearing? N N  Vision? Y Y  Difficulty concentrating or making decisions? N Y  Walking or climbing stairs? Y Y  Comment - Need a rail  Dressing or bathing? N N  Doing errands, shopping? N N  Preparing Food and eating ? - N  Using the Toilet? - N  In the past six months, have you accidently leaked urine? - Y  Do you have problems with loss of bowel control? - N  Managing your Medications? - N  Managing your Finances? - N  Housekeeping or managing your Housekeeping? - Y  Some recent data might be hidden    Fall/Depression Screening: Fall Risk  04/02/2018 01/22/2018 01/08/2018  Falls in the past year? Yes No Yes  Number falls in past yr: 1 - 1  Injury with Fall? No - No  Risk for fall due to : - - History of fall(s)  Follow up Education provided;Falls evaluation completed - Falls prevention discussed   PHQ 2/9 Scores 04/02/2018 01/08/2018 12/16/2017  PHQ - 2 Score 0 0 0    Assessment: Patient will benefit from health coach outreach for disease management and support.   THN CM Care Plan Problem One     Most Recent Value  Care Plan Problem One  Knowledge deficit regarding management of heart failure   Role Documenting the Problem One  [place] for Problem One  Active  THN Long Term Goal   In 60 days the patient will verbalize no admission   THN Long Term Goal Start Date  04/02/18  Interventions for Problem One Long Term Goal  Reviewed signs and symptoms of heart failure, discussed with  patient chf action plan, encouraged patient to weigh daily, verified with patient correct dose of fluid pills she should be taking from his last appointment       Plan: [place] will provide ongoing education for patient on heart failure through phone calls and sending printed information to patient for further discussion.  RN Health Coach will send welcome packet with consent to patient as well as printed information on heart failure.  RN Health Coach will send initial barriers letter, assessment, and care plan  to primary care physician. RN Health Coach will contact patient in the month of September and patient agrees to next outreach.  Referral made to social for resources regarding meal services, pharmacy for polypharmacy.  Lazaro Arms RN, BSN, [place] Direct Dial:  [phone]  Fax: [phone]

## 2018-04-03 ENCOUNTER — Telehealth: Payer: Self-pay | Admitting: Pharmacist

## 2018-04-03 ENCOUNTER — Telehealth: Payer: Self-pay | Admitting: *Deleted

## 2018-04-03 NOTE — Patient Outreach (Addendum)
[facility]) Care Management  [place]   04/03/2018  Kristen Whitehead 11/30/1922 [id number]  Subjective: Patient was called regarding medication management and review. HIPAA identifiers were obtained. Patient is a 84 year old female with multiple medical conditions including but not limited to:  Atrial fibrillation, chest pain at rest, chronic anticoagulation, CHF, hypertension, hyperlipidemia and hypothyroidism.  Patient reported managing her medications on her own and using a pill box. She did express some concern with the doses of furosemide, isosorbide and atenolol.  Patient uses Radio broadcast assistant when she cannot get her meds through mail order due to running out of them.  Objective:  Weight (patient reported) 132 pounds  Encounter Medications: Outpatient Encounter Medications as of 04/03/2018  Medication Sig Note  . atenolol (TENORMIN) 25 MG tablet Take 1 tablet (25 mg total) by mouth daily. 04/02/2018: Patient states that she takes 2 pills a day  . calcium carbonate (OS-CAL) 600 MG TABS tablet Take 1 tablet by mouth daily.    . Cholecalciferol (VITAMIN D) 2000 UNITS tablet Take 4,000 Units by mouth daily.    . cycloSPORINE (RESTASIS) 0.05 % ophthalmic emulsion Place 1 drop into both eyes 2 (two) times daily.   . dorzolamide (TRUSOPT) 2 % ophthalmic solution Place 1 drop into the left eye 2 (two) times daily.   Marland Kitchen estradiol (ESTRACE VAGINAL) 0.1 MG/GM vaginal cream Place 2 g vaginally once a week.  08/01/2014: .   . furosemide (LASIX) 20 MG tablet TAKE TWO TABLETS (40 MG) BY MOUTH TWICE DAILY 04/03/2018: Patient takes 4 tablets twice daily  . isosorbide mononitrate (IMDUR) 60 MG 24 hr tablet Take 1 tablet (60 mg total) by mouth daily. 04/02/2018: Patient states that she is taking 2 in the morning   . latanoprost (XALATAN) 0.005 % ophthalmic solution Place 1 drop into both eyes at bedtime.    Marland Kitchen levothyroxine (SYNTHROID,  LEVOTHROID) 100 MCG tablet Take 100 mcg by mouth daily.    . Magnesium 400 MG TABS Take 800 mg by mouth at bedtime.    . Multiple Vitamins-Minerals (PRESERVISION AREDS 2 PO) Take 2 capsules by mouth.   . potassium chloride (K-DUR,KLOR-CON) 10 MEQ tablet TAKE 2 TABLETS EVERY DAY   . pramipexole (MIRAPEX) 0.125 MG tablet Take 1 tablet (0.125 mg total) by mouth every evening.   . ranitidine (ZANTAC) 150 MG tablet Take 150 mg by mouth daily.   . temazepam (RESTORIL) 15 MG capsule Take 15 mg by mouth at bedtime.   . vitamin C (ASCORBIC ACID) 500 MG tablet Take 500 mg by mouth daily.   Marland Kitchen warfarin (COUMADIN) 5 MG tablet TAKE 1/2 TO 1 TABLET DAILY AS DIRECTED BY COUMADIN CLINIC 04/02/2018: 1/2  tablet Wednesday and Sunday and 1 tablet on other day per patient  . [DISCONTINUED] potassium chloride (K-DUR) 10 MEQ tablet TAKE TWO TABLETS BY MOUTH EVERY DAY   . Fiber POWD Take 1 Dose by mouth daily.   . nitroGLYCERIN (NITROSTAT) 0.4 MG SL tablet DISSOLVE 1 TABLET UNDER THE TONGUE EVERY 5 MINUTES FOR 3 DOSES AS NEEDED  FOR  CHEST  PAIN (Patient not taking: Reported on 04/02/2018)   . [DISCONTINUED] furosemide (LASIX) 40 MG tablet Take 2 tablets (80 mg total) by mouth 2 (two) times daily. 04/03/2018: Patient taking furosemide 20mg  4 tablets twice a day   No facility-administered encounter medications on file as of 04/03/2018.     Functional Status: In your present state of health, do you  have any difficulty performing the following activities: 04/02/2018 01/08/2018  Hearing? N N  Vision? Y Y  Difficulty concentrating or making decisions? N Y  Walking or climbing stairs? Y Y  Comment - Need a rail  Dressing or bathing? N N  Doing errands, shopping? N N  Preparing Food and eating ? - N  Using the Toilet? - N  In the past six months, have you accidently leaked urine? - Y  Do you have problems with loss of bowel control? - N  Managing your Medications? - N  Managing your Finances? - N  Housekeeping or  managing your Housekeeping? - Y  Some recent data might be hidden    Fall/Depression Screening: Fall Risk  04/02/2018 01/22/2018 01/08/2018  Falls in the past year? Yes No Yes  Number falls in past yr: 1 - 1  Injury with Fall? No - No  Risk for fall due to : - - History of fall(s)  Follow up Education provided;Falls evaluation completed - Falls prevention discussed   PHQ 2/9 Scores 04/02/2018 01/08/2018 12/16/2017  PHQ - 2 Score 0 0 0    Assessment:  Patient's medications were reviewed via telephone.  Drugs sorted by system:  Neurologic/Psychologic: Pramipexole, Temazepam,   Cardiovascular: Atenolol, furosemide, Isosorbide mononitrate, Nitroglycerin, Warfarin,   Gastrointestinal: Fiber powder, ranitidine,   Endocrine: Levothyroxine,  Vitamins/Minerals: Calcium Carbonate, Vitamin D, Magnesium, Multiple Vitamin, Potassium chloride, vitamin c,   Miscellaneous: Cyclosporine, dorzolamide, estradiol,  latanoprost,   Medications to avoid in the elderly:  Temazepam  Drug interactions:  Temazepam/Pramipexole-cns depression and increased fall risk  Medication Dose Discrepancies: Atenolol 25mg -patient reported taking 1 tablet twice daily. The prescription was ordered as 1 tablet daily.  Isosorbide Mononitrate-patient reported taking 2 tablets in the morning and the prescription was ordered as Isosorbide MN 60mg  1 tablet daily.  Furosemide-patient reported taking 4 tablets twice daily. If she is really supposed to be taking 80 mg daily, it would reduce her pill burden to take an 80 mg furosemide tablet twice daily. In addition, the patient reported she is out of furosemide. Dr. Blenda Mounts office was called    Plan: Dr. Blenda Mounts office was called for clarification on the doses of: atenolol, isosorbide mononitrate, and furosemide.   A home visit was scheduled with the patient on 04/13/2018.  I will follow up with Dr. Blenda Mounts office in 3-5 business days if I do not hear from  them first.   Elayne Guerin, PharmD, [place] Clinical Pharmacist [phone]

## 2018-04-03 NOTE — Telephone Encounter (Signed)
Denyse Amass, pharmacist with [facility] left a msg on the refill vm requesting a call back to clarify four of patients medications. [name] can be reached at [phone]. Thanks, MI

## 2018-04-05 NOTE — Telephone Encounter (Signed)
Ok to refill 

## 2018-04-07 ENCOUNTER — Other Ambulatory Visit: Payer: Self-pay

## 2018-04-07 ENCOUNTER — Encounter: Payer: Self-pay | Admitting: Pharmacist

## 2018-04-07 DIAGNOSIS — I10 Essential (primary) hypertension: Secondary | ICD-10-CM | POA: Diagnosis not present

## 2018-04-07 NOTE — Patient Outreach (Signed)
[facility]) Care Management  04/07/2018  Kristen Whitehead [birth year] [id number]  Successful outreach to the patient on today's date, HIPAA identifiers confirmed. BSW introduced self to the patient and the reason for today's call. The patient stated she is currently driving but feels she will no longer be able to drive in the future stating "my eyes are getting bad". The patient also states "I don't eat right because it takes too long to cook". BSW spoke with the patient about the mobile meals program provided by [employer].   BSW explained that although the patient is not currently homebound, this BSW would initiate a referral to the mobile meals program given the lengthy waiting list. The patient understands she will be contacted by a mobile meals representative once a space has opened to service her. BSW explained that the current waiting list is up to 6 months long. The patient stated understanding.  During today's call BSW encouraged the patient to access senior centers and congregate meals sites while she is still able to drive in the community. The patient acknowledged this as a good option to increase socialization. BSW mailed the patient information on the senior center located within [employer] of [place].   Plan: BSW will outreach the patient in the next two weeks to confirm receipt of mailings. BSW will also remind the patient of her recent referral for mobile meals and to expect a call in the coming months. BSW will perform a case closure once confirming resources are received.  Daneen Schick, BSW, CDP [facility] [place] [phone]

## 2018-04-08 ENCOUNTER — Telehealth: Payer: Self-pay | Admitting: Cardiovascular Disease

## 2018-04-08 ENCOUNTER — Telehealth: Payer: Self-pay

## 2018-04-08 ENCOUNTER — Ambulatory Visit: Payer: Self-pay | Admitting: Pharmacist

## 2018-04-08 ENCOUNTER — Other Ambulatory Visit: Payer: Self-pay | Admitting: Pharmacist

## 2018-04-08 ENCOUNTER — Encounter (HOSPITAL_COMMUNITY): Payer: Medicare HMO

## 2018-04-08 MED ORDER — FUROSEMIDE 20 MG PO TABS: ORAL_TABLET | ORAL | 3 refills | Status: DC

## 2018-04-08 MED ORDER — ATENOLOL 25 MG PO TABS: 25.0000 mg | ORAL_TABLET | Freq: Two times a day (BID) | ORAL | 3 refills | Status: DC

## 2018-04-08 NOTE — Telephone Encounter (Signed)
Spoke to [place] a Software engineer from [facility] who states she is scheduled for a house visit on mon with pt, and  calling for clarification on pt medication instruction. She reports pt is taking Atenolol 25 mg BID and Isosorbide 60 mg 2 tablets daily. She is unsure if pt has 40 or 20 mg tablet of lasix but states pt reports taking 4 tabs BID.   Per chart review pt should be taking Atenolol 25 mg daily,  Lasix 40 mg( 2 tablet) BID, and Isosorbide 60 mg daily. Routing to Dr. Oval Linsey for clarification.

## 2018-04-08 NOTE — Telephone Encounter (Signed)
Spoke with patient and she has been taking Atenolol 25 mg twice a day and Lasix 40 mg 2 tablets twice a day for months. Her PCP decreased her Isosorbide to 30 mg daily secondary to low blood pressure. Discussed with Dr Oval Linsey and ok for her to continue same dose of medications and follow up with Pharm D 1-2   Spoke with [facility] and made sure patients current Atenolol and Lasix Rx's were correct

## 2018-04-08 NOTE — Patient Outreach (Signed)
[facility]) Care Management  04/08/2018  Kristen Whitehead 1923-04-21 [id number]   Called Dr. Blenda Mounts office and left another message about needing clarification on:  Isosorbide MN, Furosemide and Atenolol.   Spoke with a triage nurse.  Plan: Await a call back. Conduct home visit on Monday April 13, 2018   Elayne Guerin, PharmD, [place] Clinical Pharmacist [phone]

## 2018-04-09 DIAGNOSIS — H40052 Ocular hypertension, left eye: Secondary | ICD-10-CM | POA: Diagnosis not present

## 2018-04-09 DIAGNOSIS — H04123 Dry eye syndrome of bilateral lacrimal glands: Secondary | ICD-10-CM | POA: Diagnosis not present

## 2018-04-09 DIAGNOSIS — H1820 Unspecified corneal edema: Secondary | ICD-10-CM | POA: Diagnosis not present

## 2018-04-09 DIAGNOSIS — H401131 Primary open-angle glaucoma, bilateral, mild stage: Secondary | ICD-10-CM | POA: Diagnosis not present

## 2018-04-09 DIAGNOSIS — H5200 Hypermetropia, unspecified eye: Secondary | ICD-10-CM | POA: Diagnosis not present

## 2018-04-09 DIAGNOSIS — H35342 Macular cyst, hole, or pseudohole, left eye: Secondary | ICD-10-CM | POA: Diagnosis not present

## 2018-04-09 DIAGNOSIS — H35033 Hypertensive retinopathy, bilateral: Secondary | ICD-10-CM | POA: Diagnosis not present

## 2018-04-09 NOTE — Telephone Encounter (Signed)
See phone note 04/09/18 Phone message sent to [name] to call if any further questions

## 2018-04-10 ENCOUNTER — Telehealth: Payer: Self-pay | Admitting: Cardiovascular Disease

## 2018-04-10 NOTE — Telephone Encounter (Signed)
° ° °  STAT if HR is under 50 or over 120 (normal HR is 60-100 beats per minute)  1) What is your heart rate? 70  2) Do you have a log of your heart rate readings (document readings)? 52 -->110  3) Do you have any other symptoms? No

## 2018-04-10 NOTE — Telephone Encounter (Signed)
Returned call to patient.She stated her B/P is up and down.Stated she is having to stay up until 2:00 am to take her B/P medications.Advised appointment moved up to see pharmacist in B/P clinic 04/16/18 at 11:30 am.Advised to bring a diary of B/P readings and all medications to appointment.

## 2018-04-13 ENCOUNTER — Other Ambulatory Visit: Payer: Self-pay | Admitting: Pharmacist

## 2018-04-13 NOTE — Patient Outreach (Signed)
[facility]) Care Management  [place]   04/13/2018  Kristen Whitehead October 15, 1922 [id number]  Subjective: Home visit completed at the patient's home today. HIPAA identifiers were obtained. Patient is a 84 year old female with multiple medical conditions including but not limited to:  Atrial fibrillation on chronic anticoagulation, hypertension, hypothyroidism, mitral regurgitation, CHF, and weakness.  Patient manages her medications on her own. She uses a pill box and Patent attorney.  Patient said she is in the coverage gap and has trouble affording Premarin Cream.  Patient complained of having dizziness episodes.   Objective:  B/P 04/12/18-128/80 04/13/18-127/64 Pulse 68  Weight- 04/12/18-132.2 04/13/18-133.6 B/P during visit-139/71 Pulse 73  Encounter Medications: Outpatient Encounter Medications as of 04/13/2018  Medication Sig Note  . atenolol (TENORMIN) 25 MG tablet Take 1 tablet (25 mg total) by mouth 2 (two) times daily.   . calcium carbonate (OS-CAL) 600 MG TABS tablet Take 1 tablet by mouth daily.    . Cholecalciferol (VITAMIN D) 2000 UNITS tablet Take 4,000 Units by mouth daily.    . cycloSPORINE (RESTASIS) 0.05 % ophthalmic emulsion Place 1 drop into both eyes 2 (two) times daily.   . dorzolamide (TRUSOPT) 2 % ophthalmic solution Place 1 drop into the left eye 2 (two) times daily.   . furosemide (LASIX) 40 MG tablet Take 40 mg by mouth 2 (two) times daily.   . isosorbide mononitrate (IMDUR) 30 MG 24 hr tablet Take 30 mg by mouth daily.   Marland Kitchen latanoprost (XALATAN) 0.005 % ophthalmic solution Place 1 drop into both eyes at bedtime.    Marland Kitchen levothyroxine (SYNTHROID, LEVOTHROID) 100 MCG tablet Take 100 mcg by mouth daily.    . Magnesium 500 MG CAPS Take 2 capsules by mouth daily.   . Multiple Vitamins-Minerals (CENTRUM SILVER PO) Take 1 tablet by mouth daily.   . Multiple Vitamins-Minerals (PRESERVISION AREDS 2 PO) Take 2 capsules by  mouth.   . potassium chloride (K-DUR,KLOR-CON) 10 MEQ tablet TAKE 2 TABLETS EVERY DAY   . pramipexole (MIRAPEX) 0.125 MG tablet Take 1 tablet (0.125 mg total) by mouth every evening.   Marland Kitchen PREMARIN vaginal cream Place 2 g vaginally once a week.   . Probiotic Product (PROBIOTIC DAILY PO) Take 1 tablet by mouth daily.   . ranitidine (ZANTAC) 150 MG tablet Take 150 mg by mouth daily.   . temazepam (RESTORIL) 15 MG capsule Take 15 mg by mouth at bedtime.   . vitamin C (ASCORBIC ACID) 500 MG tablet Take 500 mg by mouth daily.   Marland Kitchen warfarin (COUMADIN) 5 MG tablet TAKE 1/2 TO 1 TABLET DAILY AS DIRECTED BY COUMADIN CLINIC 04/02/2018: 1/2  tablet Wednesday and Sunday and 1 tablet on other day per patient  . [DISCONTINUED] furosemide (LASIX) 20 MG tablet Take 2 tablets by mouth twice a day (Patient taking differently: 40 mg 2 (two) times daily. Take 2 tablets by mouth twice a day)   . Fiber POWD Take 1 Dose by mouth daily.   . nitroGLYCERIN (NITROSTAT) 0.4 MG SL tablet DISSOLVE 1 TABLET UNDER THE TONGUE EVERY 5 MINUTES FOR 3 DOSES AS NEEDED  FOR  CHEST  PAIN (Patient not taking: Reported on 04/02/2018)   . [DISCONTINUED] estradiol (ESTRACE VAGINAL) 0.1 MG/GM vaginal cream Place 2 g vaginally once a week.  08/01/2014: .   Marland Kitchen [DISCONTINUED] Magnesium 400 MG TABS Take 800 mg by mouth at bedtime.     No facility-administered encounter medications on file as of 04/13/2018.  Functional Status: In your present state of health, do you have any difficulty performing the following activities: 04/02/2018 01/08/2018  Hearing? N N  Vision? Y Y  Difficulty concentrating or making decisions? N Y  Walking or climbing stairs? Y Y  Comment - Need a rail  Dressing or bathing? N N  Doing errands, shopping? N N  Preparing Food and eating ? - N  Using the Toilet? - N  In the past six months, have you accidently leaked urine? - Y  Do you have problems with loss of bowel control? - N  Managing your Medications? - N   Managing your Finances? - N  Housekeeping or managing your Housekeeping? - Y  Some recent data might be hidden    Fall/Depression Screening: Fall Risk  04/02/2018 01/22/2018 01/08/2018  Falls in the past year? Yes No Yes  Number falls in past yr: 1 - 1  Injury with Fall? No - No  Risk for fall due to : - - History of fall(s)  Follow up Education provided;Falls evaluation completed - Falls prevention discussed   PHQ 2/9 Scores 04/02/2018 01/08/2018 12/16/2017  PHQ - 2 Score 0 0 0    Assessment: Patient's medications were reviewed in her home.    Drugs sorted by system:  Neurologic/Psychologic: Pramipexole, temazepam,   Cardiovascular: Atenolol, Furosemide, Isosorbide MN, Warfarin, Nitroglycerin (does not have)  Gastrointestinal: Ranitidine, Probiotic  Endocrine: Levothyroxine  Vitamins/Minerals: Calcium Carbonate, Cholecalciferol, Magnesium, Preservation, Centrum Silver, Potassium Chloride  Miscellaneous: Dorzolamide, Restasis, Latanoprost, Premarin Cream   Plan/Interventions: --Called [employer] to access the patient's OTC benefit to order: Vitamin C, Magnesium, Underpads, Calcium Carbonate   -Refilled: Warfarin-$5.85 for 90 day supply  -Completed [employer] Patient Assistance Application for Premarin Vaginal Cream  -Patient could not find her proof of income (Social Security Benefits Letter) but said she would look for it.    -Reviewed the importance of eating balanced meals. On the day of the home visit, patient had only eaten a few teaspoonfuls of applesauce and drank a small container of Boost.  By the end of the visit, she complained of dizziness and feeling "woozy headed".  Her blood pressure was checked and it was:  139/71 and her pulse was 73.  Her blood pressure/heart rate//weight log was reviewed and her blood pressure had not fluctuated much in the last few days and all readings were within normal limits.  Patient did not have a blood glucose meter to check her  blood sugar.    -Patient will be called in one week to follow up on medication assistance with Premarin Vaginal Cream   Elayne Guerin, PharmD, [facility] Clinical Pharmacist [phone]   Elayne Guerin, PharmD, [place] Clinical Pharmacist [phone]

## 2018-04-14 ENCOUNTER — Other Ambulatory Visit: Payer: Self-pay | Admitting: Pharmacist

## 2018-04-14 MED ORDER — WARFARIN SODIUM 5 MG PO TABS: ORAL_TABLET | ORAL | 0 refills | Status: DC

## 2018-04-15 DIAGNOSIS — N8182 Incompetence or weakening of pubocervical tissue: Secondary | ICD-10-CM | POA: Diagnosis not present

## 2018-04-16 ENCOUNTER — Ambulatory Visit (INDEPENDENT_AMBULATORY_CARE_PROVIDER_SITE_OTHER): Payer: Medicare HMO | Admitting: Pharmacist

## 2018-04-16 VITALS — BP 118/64 | HR 64

## 2018-04-16 DIAGNOSIS — I482 Chronic atrial fibrillation, unspecified: Secondary | ICD-10-CM

## 2018-04-16 DIAGNOSIS — Z5181 Encounter for therapeutic drug level monitoring: Secondary | ICD-10-CM

## 2018-04-16 DIAGNOSIS — I10 Essential (primary) hypertension: Secondary | ICD-10-CM

## 2018-04-16 LAB — POCT INR: INR: 2.9 (ref 2.0–3.0)

## 2018-04-16 NOTE — Progress Notes (Signed)
See RRG note

## 2018-04-16 NOTE — Patient Instructions (Addendum)
Return for a a follow up appointment in 4 weeks  Check your blood pressure at home daily (if able) and keep record of the readings.  Take your BP meds as follows: *STAY hydrated* *INCREASE walking * *CHANGE temazepam 15mg  to every other as tolerated* *CHANGE atenolol 25mg  to morning and early evening (2nd dose together with furosemide)  Bring all of your meds, your BP cuff and your record of home blood pressures to your next appointment.  Exercise as you're able, try to walk approximately 30 minutes per day.  Keep salt intake to a minimum, especially watch canned and prepared boxed foods.  Eat more fresh fruits and vegetables and fewer canned items.  Avoid eating in fast food restaurants.    HOW TO TAKE YOUR BLOOD PRESSURE: . Rest 5 minutes before taking your blood pressure. .  Don't smoke or drink caffeinated beverages for at least 30 minutes before. . Take your blood pressure before (not after) you eat. . Sit comfortably with your back supported and both feet on the floor (don't cross your legs). . Elevate your arm to heart level on a table or a desk. . Use the proper sized cuff. It should fit smoothly and snugly around your bare upper arm. There should be enough room to slip a fingertip under the cuff. The bottom edge of the cuff should be 1 inch above the crease of the elbow. . Ideally, take 3 measurements at one sitting and record the average.

## 2018-04-16 NOTE — Progress Notes (Signed)
Patient ID: Kristen Whitehead                 DOB: [birth year]                      MRN: [id number]     HPI: Kristen Whitehead is a 84 y.o. female referred by Dr. Oval Linsey to HTN clinic. PMH includes atrial fibrillation, hypertension, hyperlipidemia,macular degeneration, calcium deposit on her eye, and heart failure. Patient recently reported persistent dizziness and lightheadedness foir few weeks but denies any problems today. She is also very concern about inconsistent blood pressure reading.   Current HTN meds:  Atenolol 25mg  twice daily Furosemide 40mg  twice daily Isosorbide 30mg  daily  BP goal: 130/80  Family History: family history includes Stroke in her father and mother.  Social History: reports that she has never smoked. She has never used smokeless tobacco. She reports that she does not drink alcohol or use drugs.   Diet: low sodium, mainly home cooked but very decreased appetite  Exercise: activities of daily living  Home BP readings:  13 readings; average 132/75 ; HR 66-100 bpm  Wt Readings from Last 3 Encounters:  03/23/18 134 lb 6.4 oz (61 kg)  02/05/18 133 lb (60.3 kg)  02/03/18 135 lb (61.2 kg)   BP Readings from Last 3 Encounters:  04/16/18 118/64  03/23/18 138/81  02/05/18 118/72   Pulse Readings from Last 3 Encounters:  04/16/18 64  03/23/18 80  02/05/18 68     Past Medical History:  Diagnosis Date  . Bladder prolapse, female, acquired   . Cancer ([place])    thyroid  . Chest pain    no known ischemic heart disease; negative Myoview July 2013. EF 75% with no ischemia.   . Chronic anticoagulation   . Chronic atrial fibrillation ([facility])    managed with rate control and coumadin  . Chronic diastolic heart failure ([place])   . GERD (gastroesophageal reflux disease)   . Heart disease   . History of congenital mitral regurgitation    mild  . History of mitral valve prolapse 06/15/2008   a. echo 1/14: mild LVH, EF 60-65%, mild MR, mild to mod BAE, PASP 35   . Hypercholesterolemia   . Hypertension   . Hypothyroidism     Current Outpatient Medications on File Prior to Visit  Medication Sig Dispense Refill  . atenolol (TENORMIN) 25 MG tablet Take 1 tablet (25 mg total) by mouth 2 (two) times daily. 180 tablet 3  . Calcium-Magnesium-Zinc 333-133-5 MG TABS Take 3 tablets by mouth daily.    . Cholecalciferol (VITAMIN D) 2000 UNITS tablet Take 4,000 Units by mouth daily.     . cycloSPORINE (RESTASIS) 0.05 % ophthalmic emulsion Place 1 drop into both eyes 2 (two) times daily.    . dorzolamide (TRUSOPT) 2 % ophthalmic solution Place 1 drop into the left eye 2 (two) times daily.    . furosemide (LASIX) 40 MG tablet Take 40 mg by mouth 2 (two) times daily.    . isosorbide mononitrate (IMDUR) 30 MG 24 hr tablet Take 30 mg by mouth daily.    Marland Kitchen latanoprost (XALATAN) 0.005 % ophthalmic solution Place 1 drop into both eyes at bedtime.     Marland Kitchen levothyroxine (SYNTHROID, LEVOTHROID) 100 MCG tablet Take 100 mcg by mouth daily.     . Multiple Vitamins-Minerals (CENTRUM SILVER PO) Take 1 tablet by mouth daily.    . Multiple Vitamins-Minerals (PRESERVISION AREDS 2 PO) Take 2  capsules by mouth.    . potassium chloride (K-DUR,KLOR-CON) 10 MEQ tablet TAKE 2 TABLETS EVERY DAY 180 tablet 2  . pramipexole (MIRAPEX) 0.125 MG tablet Take 1 tablet (0.125 mg total) by mouth every evening. 30 tablet 0  . Probiotic Product (PROBIOTIC DAILY PO) Take 1 tablet by mouth daily.    . ranitidine (ZANTAC) 150 MG tablet Take 150 mg by mouth daily.    . temazepam (RESTORIL) 15 MG capsule Take 15 mg by mouth at bedtime.    Marland Kitchen warfarin (COUMADIN) 5 MG tablet Take 1/2 to 1 tablet daily as directed by coumadin clinic 90 tablet 0  . nitroGLYCERIN (NITROSTAT) 0.4 MG SL tablet DISSOLVE 1 TABLET UNDER THE TONGUE EVERY 5 MINUTES FOR 3 DOSES AS NEEDED  FOR  CHEST  PAIN (Patient not taking: Reported on 04/02/2018) 50 tablet 2  . PREMARIN vaginal cream Place 2 g vaginally once a week.     No current  facility-administered medications on file prior to visit.     Allergies  Allergen Reactions  . Azithromycin Nausea Only  . Compazine [Prochlorperazine Edisylate] Nausea Only  . Demerol Nausea Only  . Dolophine [Methadone] Nausea Only  . Ebastine Nausea Only    EBS  . Erythromycin Nausea Only  . Ibuprofen Nausea Only  . Statins Other (See Comments)    myalgias  . Tetanus Toxoids Nausea Only  . Tetracyclines & Related Nausea Only    Blood pressure 118/64, pulse 64.  Essential hypertension Blood pressure and HR at goal today and patient reports complete resolution of dizziness. Medication reconciliation was completed, few supplements were discontinued due to duplication, and no significant drug-drug interaction noted.  BP readings at home may be affected by inaccurate reading during Afib episodes or inaccurate automatic cuff. Atenolol evening dose was moved to supper time to avoid increased BP at bedtime.  Patient was also encouraged to increase physical activity and stay hydrated. She should seek appointment with PCP if symptoms of vertigo return.  Will follow up in 4 weeks with HTN and Coumadin clinic. Home BP cuff will be calibrated during next OV.  Kristen Whitehead PharmD, BCPS, [place] [id number] 04/17/2018 5:54 PM

## 2018-04-17 ENCOUNTER — Encounter: Payer: Self-pay | Admitting: Pharmacist

## 2018-04-17 NOTE — Assessment & Plan Note (Signed)
Blood pressure and HR at goal today and patient reports complete resolution of dizziness. Medication reconciliation was completed, few supplements were discontinued due to duplication, and no significant drug-drug interaction noted.  BP readings at home may be affected by inaccurate reading during Afib episodes or inaccurate automatic cuff. Atenolol evening dose was moved to supper time to avoid increased BP at bedtime.  Patient was also encouraged to increase physical activity and stay hydrated. She should seek appointment with PCP if symptoms of vertigo return.  Will follow up in 4 weeks with HTN and Coumadin clinic. Home BP cuff will be calibrated during next OV.

## 2018-04-21 ENCOUNTER — Encounter (HOSPITAL_COMMUNITY): Payer: Medicare HMO

## 2018-04-23 ENCOUNTER — Other Ambulatory Visit: Payer: Self-pay

## 2018-04-23 NOTE — Patient Outreach (Signed)
[place] [facility]) Care Management  04/23/2018  Kristen Whitehead 03/24/1923 [id number]  Successful call to the patient on today's date, HIPAA identifiers confirmed. The patient confirms receipt of resources mailed regarding community engagement. The patient denies contacting any resources but states she has been feeling well lately and just returned home from shopping. BSW encouraged the patient to utilize resources mailed to stay active and have social interaction. The patient stated "I play cards and eat lunch with my neighbors once a week and they keep me active".   BSW also reminded the patient she has been added to the waiting list to receive mobile meals via [facility] [place]. BSW re-explained the waiting list process to the patient and reminded her it could take up to 6 months before she is contacted. The patient stated understanding. The patient also expressed concern she would be unable to eat the provided food due to her gluten free diet. BSW reminded the patient to ask for a menu and only select meal deliveries on days the menu accomodates her diet. The patient stated understanding.  During today's call the patient also mentioned a weight increase of "four pounds on Saturday". The patient denies notifying her doctor stating "they are closed on Saturday". BSW reminded the patient the importance of weighing daily and reporting significant weight gain or loss to her physician. The patient reported "I lost it all already". The patient acknowledged it is important to report weight gains however she then stated "once in a while I gain 3-4 pounds. I forget to tell the doctor because I weigh early in the morning before they are open". BSW again encouraged the patient to write herself a reminder to call her physician to report weight changes. BSW also notified the patient this BSW would notify other [facility] care management team members of patients reported weight gain.  Plan: BSW  to perform a discipline closure as no other social work needs have been identified. BSW sent an in-basket message to both [place], Lazaro Arms and [facility] Pharmacist, Denyse Amass notifying of discipline closure and patients reported weight gains.  Daneen Schick, BSW, CDP [facility] [phone]

## 2018-04-24 ENCOUNTER — Other Ambulatory Visit: Payer: Self-pay | Admitting: Pharmacist

## 2018-04-24 NOTE — Patient Outreach (Signed)
[place] [facility]) Care Management  04/24/2018  Kristen Whitehead 02-Sep-1922 [id number]  Patient was called to follow up on dizziness. HIPAA identifiers were obtained.  Patient said she saw another clinical pharmacist on 04/16/18 who changed the time she takes her Atenolol from morning and bedtime to morning and evening. She said this has helped her dizziness and it has subsided.   Patient reported having all of her medications and is taking them as prescribed.  Plan: Close patient's pharmacy case.  Elayne Guerin, PharmD, [place] Clinical Pharmacist [phone]

## 2018-04-27 ENCOUNTER — Telehealth: Payer: Self-pay | Admitting: Cardiovascular Disease

## 2018-04-27 NOTE — Telephone Encounter (Signed)
° ° °  Pt c/o medication issue:  1. Name of Medication: atenolol (TENORMIN) 25 MG tablet  2. How are you currently taking this medication (dosage and times per day)? Take 1 tablet (25 mg total) by mouth 2 (two) times daily.  3. Are you having a reaction (difficulty breathing--STAT)? No  4. What is your medication issue? Patient states she took medication at the wrong time , worried because she took them 1 hour apart by mistake. Please call

## 2018-04-27 NOTE — Telephone Encounter (Signed)
Spoke with patient and she took both of her Atenolol this morning about an hour apart. Blood pressure 132/72 HR 83. Advised ok to take her evening dose as scheduled.

## 2018-04-29 DIAGNOSIS — N8182 Incompetence or weakening of pubocervical tissue: Secondary | ICD-10-CM | POA: Diagnosis not present

## 2018-05-06 ENCOUNTER — Other Ambulatory Visit: Payer: Self-pay

## 2018-05-06 NOTE — Telephone Encounter (Signed)
Encounter not needed

## 2018-05-06 NOTE — Patient Outreach (Signed)
[place] [facility]) Care Management  05/06/2018   Kristen Whitehead Sep 20, 1922 [id number]  Subjective: Telephone call to the patient for assessment. HIPAA verified.  The patient states that she is doing well. She received the information that I had mailed to her but has not had time to read it yet.  She denies any chest pain or shortness of breath. She states that she has some swelling in her hands but not much. Her weight today is 133 lbs yesterday it was 134.2.  She states that she talked with her  pharmacist about her medications and is very happy.  She has changed to the time of day that she takes her Isosorbide and she has not had any dizzy spells.  The patient does not remember when her appointment is for her cardiologist because she lost her calendar but plans on calling tomorrow to get the information.  She is planning on receiving her flu shot in November when she has her physical.   Current Medications:  Current Outpatient Medications  Medication Sig Dispense Refill  . atenolol (TENORMIN) 25 MG tablet Take 1 tablet (25 mg total) by mouth 2 (two) times daily. 180 tablet 3  . Calcium-Magnesium-Zinc 333-133-5 MG TABS Take 3 tablets by mouth daily.    . Cholecalciferol (VITAMIN D) 2000 UNITS tablet Take 4,000 Units by mouth daily.     . cycloSPORINE (RESTASIS) 0.05 % ophthalmic emulsion Place 1 drop into both eyes 2 (two) times daily.    . dorzolamide (TRUSOPT) 2 % ophthalmic solution Place 1 drop into the left eye 2 (two) times daily.    . furosemide (LASIX) 40 MG tablet Take 40 mg by mouth 2 (two) times daily.    . isosorbide mononitrate (IMDUR) 30 MG 24 hr tablet Take 30 mg by mouth daily.    Marland Kitchen latanoprost (XALATAN) 0.005 % ophthalmic solution Place 1 drop into both eyes at bedtime.     Marland Kitchen levothyroxine (SYNTHROID, LEVOTHROID) 100 MCG tablet Take 100 mcg by mouth daily.     . Multiple Vitamins-Minerals (CENTRUM SILVER PO) Take 1 tablet by mouth daily.    . Multiple  Vitamins-Minerals (PRESERVISION AREDS 2 PO) Take 2 capsules by mouth.    . potassium chloride (K-DUR,KLOR-CON) 10 MEQ tablet TAKE 2 TABLETS EVERY DAY 180 tablet 2  . pramipexole (MIRAPEX) 0.125 MG tablet Take 1 tablet (0.125 mg total) by mouth every evening. 30 tablet 0  . PREMARIN vaginal cream Place 2 g vaginally once a week.    . Probiotic Product (PROBIOTIC DAILY PO) Take 1 tablet by mouth daily.    . ranitidine (ZANTAC) 150 MG tablet Take 150 mg by mouth daily.    . temazepam (RESTORIL) 15 MG capsule Take 15 mg by mouth at bedtime.    Marland Kitchen warfarin (COUMADIN) 5 MG tablet Take 1/2 to 1 tablet daily as directed by coumadin clinic 90 tablet 0  . nitroGLYCERIN (NITROSTAT) 0.4 MG SL tablet DISSOLVE 1 TABLET UNDER THE TONGUE EVERY 5 MINUTES FOR 3 DOSES AS NEEDED  FOR  CHEST  PAIN (Patient not taking: Reported on 05/06/2018) 50 tablet 2   No current facility-administered medications for this visit.     Functional Status:  In your present state of health, do you have any difficulty performing the following activities: 04/02/2018 01/08/2018  Hearing? N N  Vision? Y Y  Difficulty concentrating or making decisions? N Y  Walking or climbing stairs? Y Y  Comment - Need a rail  Dressing or bathing? N  N  Doing errands, shopping? N N  Preparing Food and eating ? - N  Using the Toilet? - N  In the past six months, have you accidently leaked urine? - Y  Do you have problems with loss of bowel control? - N  Managing your Medications? - N  Managing your Finances? - N  Housekeeping or managing your Housekeeping? - Y  Some recent data might be hidden    Fall/Depression Screening: Fall Risk  05/06/2018 04/02/2018 01/22/2018  Falls in the past year? No Yes No  Number falls in past yr: - 1 -  Injury with Fall? - No -  Risk for fall due to : - - -  Follow up - Education provided;Falls evaluation completed -   PHQ 2/9 Scores 04/02/2018 01/08/2018 12/16/2017  PHQ - 2 Score 0 0 0    Assessment: Patient will  continue to benefit from health coach outreach for disease management and support.  THN CM Care Plan Problem One     Most Recent Value  THN Long Term Goal   In 60 days the patient will verbalize no admission   THN Long Term Goal Start Date  05/06/18  Interventions for Problem One Long Term Goal  Reviewed medication, discussed signs and symptome of chf     Plan: RN Health Coach will contact patient in the month of November and patient agrees to next outreach.  Lazaro Arms RN, BSN, [place] Direct Dial:  [phone]  Fax: [phone]

## 2018-05-14 ENCOUNTER — Ambulatory Visit: Payer: Medicare HMO

## 2018-05-14 NOTE — Progress Notes (Deleted)
Patient ID: Kristen Whitehead                 DOB: November 16, 1922                      MRN: [id number]     HPI: Kristen Whitehead is a 84 y.o. female referred by Dr. Oval Linsey to HTN clinic. PMH includes atrial fibrillation, hypertension, hyperlipidemia,macular degeneration, calcium deposit on her eye, and heart failure. Patient recently reported persistent dizziness and lightheadedness foir few weeks but denies any problems today. She is also very concern about inconsistent blood pressure reading.   Current HTN meds:  Atenolol 25mg  twice daily Furosemide 40mg  twice daily Isosorbide 30mg  daily  BP goal: 130/80  Family History: family history includes Stroke in her father and mother.  Social History: reports that she has never smoked. She has never used smokeless tobacco. She reports that she does not drink alcohol or use drugs.   Diet: low sodium, mainly home cooked but very decreased appetite  Exercise: activities of daily living  Home BP readings:    Wt Readings from Last 3 Encounters:  03/23/18 134 lb 6.4 oz (61 kg)  02/05/18 133 lb (60.3 kg)  02/03/18 135 lb (61.2 kg)   BP Readings from Last 3 Encounters:  04/16/18 118/64  03/23/18 138/81  02/05/18 118/72   Pulse Readings from Last 3 Encounters:  04/16/18 64  03/23/18 80  02/05/18 68     Past Medical History:  Diagnosis Date  . Bladder prolapse, female, acquired   . Cancer ([place])    thyroid  . Chest pain    no known ischemic heart disease; negative Myoview July 2013. EF 75% with no ischemia.   . Chronic anticoagulation   . Chronic atrial fibrillation (HCC)    managed with rate control and coumadin  . Chronic diastolic heart failure ([place])   . GERD (gastroesophageal reflux disease)   . Heart disease   . History of congenital mitral regurgitation    mild  . History of mitral valve prolapse 06/15/2008   a. echo 1/14: mild LVH, EF 60-65%, mild MR, mild to mod BAE, PASP 35  . Hypercholesterolemia   . Hypertension    . Hypothyroidism     Current Outpatient Medications on File Prior to Visit  Medication Sig Dispense Refill  . atenolol (TENORMIN) 25 MG tablet Take 1 tablet (25 mg total) by mouth 2 (two) times daily. 180 tablet 3  . Calcium-Magnesium-Zinc 333-133-5 MG TABS Take 3 tablets by mouth daily.    . Cholecalciferol (VITAMIN D) 2000 UNITS tablet Take 4,000 Units by mouth daily.     . cycloSPORINE (RESTASIS) 0.05 % ophthalmic emulsion Place 1 drop into both eyes 2 (two) times daily.    . dorzolamide (TRUSOPT) 2 % ophthalmic solution Place 1 drop into the left eye 2 (two) times daily.    . furosemide (LASIX) 40 MG tablet Take 40 mg by mouth 2 (two) times daily.    . isosorbide mononitrate (IMDUR) 30 MG 24 hr tablet Take 30 mg by mouth daily.    Marland Kitchen latanoprost (XALATAN) 0.005 % ophthalmic solution Place 1 drop into both eyes at bedtime.     Marland Kitchen levothyroxine (SYNTHROID, LEVOTHROID) 100 MCG tablet Take 100 mcg by mouth daily.     . Multiple Vitamins-Minerals (CENTRUM SILVER PO) Take 1 tablet by mouth daily.    . Multiple Vitamins-Minerals (PRESERVISION AREDS 2 PO) Take 2 capsules by mouth.    Marland Kitchen  nitroGLYCERIN (NITROSTAT) 0.4 MG SL tablet DISSOLVE 1 TABLET UNDER THE TONGUE EVERY 5 MINUTES FOR 3 DOSES AS NEEDED  FOR  CHEST  PAIN (Patient not taking: Reported on 05/06/2018) 50 tablet 2  . potassium chloride (K-DUR,KLOR-CON) 10 MEQ tablet TAKE 2 TABLETS EVERY DAY 180 tablet 2  . pramipexole (MIRAPEX) 0.125 MG tablet Take 1 tablet (0.125 mg total) by mouth every evening. 30 tablet 0  . PREMARIN vaginal cream Place 2 g vaginally once a week.    . Probiotic Product (PROBIOTIC DAILY PO) Take 1 tablet by mouth daily.    . ranitidine (ZANTAC) 150 MG tablet Take 150 mg by mouth daily.    . temazepam (RESTORIL) 15 MG capsule Take 15 mg by mouth at bedtime.    Marland Kitchen warfarin (COUMADIN) 5 MG tablet Take 1/2 to 1 tablet daily as directed by coumadin clinic 90 tablet 0   No current facility-administered medications on file  prior to visit.     Allergies  Allergen Reactions  . Azithromycin Nausea Only  . Compazine [Prochlorperazine Edisylate] Nausea Only  . Demerol Nausea Only  . Dolophine [Methadone] Nausea Only  . Ebastine Nausea Only    EBS  . Erythromycin Nausea Only  . Ibuprofen Nausea Only  . Statins Other (See Comments)    myalgias  . Tetanus Toxoids Nausea Only  . Tetracyclines & Related Nausea Only    There were no vitals taken for this visit.  No problem-specific Assessment & Plan notes found for this encounter.  Salome Cozby Rodriguez-Guzman PharmD, BCPS, [place] [address] 05/14/2018 12:29 PM

## 2018-05-19 ENCOUNTER — Ambulatory Visit (INDEPENDENT_AMBULATORY_CARE_PROVIDER_SITE_OTHER): Payer: Medicare HMO | Admitting: Pharmacist Clinician (PhC)/ Clinical Pharmacy Specialist

## 2018-05-19 DIAGNOSIS — I482 Chronic atrial fibrillation, unspecified: Secondary | ICD-10-CM | POA: Diagnosis not present

## 2018-05-19 DIAGNOSIS — I4891 Unspecified atrial fibrillation: Secondary | ICD-10-CM | POA: Diagnosis not present

## 2018-05-19 DIAGNOSIS — Z5181 Encounter for therapeutic drug level monitoring: Secondary | ICD-10-CM

## 2018-05-19 LAB — POCT INR: INR: 2.8 (ref 2.0–3.0)

## 2018-05-20 DIAGNOSIS — L308 Other specified dermatitis: Secondary | ICD-10-CM | POA: Diagnosis not present

## 2018-05-20 DIAGNOSIS — L853 Xerosis cutis: Secondary | ICD-10-CM | POA: Diagnosis not present

## 2018-05-20 DIAGNOSIS — L218 Other seborrheic dermatitis: Secondary | ICD-10-CM | POA: Diagnosis not present

## 2018-05-20 DIAGNOSIS — Z85828 Personal history of other malignant neoplasm of skin: Secondary | ICD-10-CM | POA: Diagnosis not present

## 2018-05-21 ENCOUNTER — Ambulatory Visit: Payer: Medicare HMO

## 2018-06-02 DIAGNOSIS — N8182 Incompetence or weakening of pubocervical tissue: Secondary | ICD-10-CM | POA: Diagnosis not present

## 2018-06-15 ENCOUNTER — Ambulatory Visit (INDEPENDENT_AMBULATORY_CARE_PROVIDER_SITE_OTHER): Payer: Medicare HMO | Admitting: Pharmacist

## 2018-06-15 DIAGNOSIS — I482 Chronic atrial fibrillation, unspecified: Secondary | ICD-10-CM

## 2018-06-15 DIAGNOSIS — Z5181 Encounter for therapeutic drug level monitoring: Secondary | ICD-10-CM | POA: Diagnosis not present

## 2018-06-15 LAB — POCT INR: INR: 4.5 — AB (ref 2.0–3.0)

## 2018-06-19 DIAGNOSIS — M85872 Other specified disorders of bone density and structure, left ankle and foot: Secondary | ICD-10-CM | POA: Diagnosis not present

## 2018-06-19 DIAGNOSIS — Z23 Encounter for immunization: Secondary | ICD-10-CM | POA: Diagnosis not present

## 2018-06-19 DIAGNOSIS — M79672 Pain in left foot: Secondary | ICD-10-CM | POA: Diagnosis not present

## 2018-06-19 DIAGNOSIS — M7989 Other specified soft tissue disorders: Secondary | ICD-10-CM | POA: Diagnosis not present

## 2018-06-19 DIAGNOSIS — K219 Gastro-esophageal reflux disease without esophagitis: Secondary | ICD-10-CM | POA: Diagnosis not present

## 2018-06-23 DIAGNOSIS — H52209 Unspecified astigmatism, unspecified eye: Secondary | ICD-10-CM | POA: Diagnosis not present

## 2018-06-23 DIAGNOSIS — H5203 Hypermetropia, bilateral: Secondary | ICD-10-CM | POA: Diagnosis not present

## 2018-06-24 DIAGNOSIS — F5101 Primary insomnia: Secondary | ICD-10-CM | POA: Diagnosis not present

## 2018-06-24 DIAGNOSIS — G2581 Restless legs syndrome: Secondary | ICD-10-CM | POA: Diagnosis not present

## 2018-06-24 DIAGNOSIS — I4821 Permanent atrial fibrillation: Secondary | ICD-10-CM | POA: Diagnosis not present

## 2018-06-24 DIAGNOSIS — M810 Age-related osteoporosis without current pathological fracture: Secondary | ICD-10-CM | POA: Diagnosis not present

## 2018-06-24 DIAGNOSIS — E890 Postprocedural hypothyroidism: Secondary | ICD-10-CM | POA: Diagnosis not present

## 2018-06-24 DIAGNOSIS — I5032 Chronic diastolic (congestive) heart failure: Secondary | ICD-10-CM | POA: Diagnosis not present

## 2018-06-24 DIAGNOSIS — E785 Hyperlipidemia, unspecified: Secondary | ICD-10-CM | POA: Diagnosis not present

## 2018-06-24 DIAGNOSIS — Z0000 Encounter for general adult medical examination without abnormal findings: Secondary | ICD-10-CM | POA: Diagnosis not present

## 2018-06-24 DIAGNOSIS — H3530 Unspecified macular degeneration: Secondary | ICD-10-CM | POA: Diagnosis not present

## 2018-06-25 ENCOUNTER — Ambulatory Visit (INDEPENDENT_AMBULATORY_CARE_PROVIDER_SITE_OTHER): Payer: Medicare HMO | Admitting: Pharmacist

## 2018-06-25 DIAGNOSIS — I482 Chronic atrial fibrillation, unspecified: Secondary | ICD-10-CM

## 2018-06-25 DIAGNOSIS — Z5181 Encounter for therapeutic drug level monitoring: Secondary | ICD-10-CM

## 2018-06-25 LAB — POCT INR: INR: 3.1 — AB (ref 2.0–3.0)

## 2018-07-07 ENCOUNTER — Other Ambulatory Visit: Payer: Self-pay

## 2018-07-07 NOTE — Patient Outreach (Signed)
[facility]) Care Management  07/07/2018   Kristen Whitehead 1923-04-17 [id number]  Subjective: Successful telephone outreach to the patient for assessment.  HIPAA verified. The patient states that she has been doing well.  She denies any chest pain shortness of breath or falls.  She states that she did have swelling in her feet three days last week. She states that the swelling has resolved.  Her weight today was 132.6.  She states that she has been staying active by baby sitting cleaning her home and driving. She is taking her medications as prescribed.  She monitors her diet.  She states that her strength in her hands is not at strong as before and she has a hard time cutting up food.  She said that she will buy things that are pre cut.  She states that she wants to build her strength.  I discussed chair exercises with her and will have them mailed.  The patient verbalized understanding.  Current Medications:  Current Outpatient Medications  Medication Sig Dispense Refill  . Calcium-Magnesium-Zinc 333-133-5 MG TABS Take 3 tablets by mouth daily.    . Cholecalciferol (VITAMIN D) 2000 UNITS tablet Take 4,000 Units by mouth daily.     . cycloSPORINE (RESTASIS) 0.05 % ophthalmic emulsion Place 1 drop into both eyes 2 (two) times daily.    . dorzolamide (TRUSOPT) 2 % ophthalmic solution Place 1 drop into the left eye 2 (two) times daily.    . furosemide (LASIX) 40 MG tablet Take 40 mg by mouth 2 (two) times daily.    . isosorbide mononitrate (IMDUR) 30 MG 24 hr tablet Take 30 mg by mouth daily.    Marland Kitchen latanoprost (XALATAN) 0.005 % ophthalmic solution Place 1 drop into both eyes at bedtime.     Marland Kitchen levothyroxine (SYNTHROID, LEVOTHROID) 100 MCG tablet Take 100 mcg by mouth daily.     . Multiple Vitamins-Minerals (CENTRUM SILVER PO) Take 1 tablet by mouth daily.    . Multiple Vitamins-Minerals (PRESERVISION AREDS 2 PO) Take 2 capsules by mouth.    . potassium chloride  (K-DUR,KLOR-CON) 10 MEQ tablet TAKE 2 TABLETS EVERY DAY 180 tablet 2  . pramipexole (MIRAPEX) 0.125 MG tablet Take 1 tablet (0.125 mg total) by mouth every evening. 30 tablet 0  . PREMARIN vaginal cream Place 2 g vaginally once a week.    . Probiotic Product (PROBIOTIC DAILY PO) Take 1 tablet by mouth daily.    . temazepam (RESTORIL) 15 MG capsule Take 15 mg by mouth at bedtime.    Marland Kitchen warfarin (COUMADIN) 5 MG tablet Take 1/2 to 1 tablet daily as directed by coumadin clinic 90 tablet 0  . atenolol (TENORMIN) 25 MG tablet Take 1 tablet (25 mg total) by mouth 2 (two) times daily. 180 tablet 3  . nitroGLYCERIN (NITROSTAT) 0.4 MG SL tablet DISSOLVE 1 TABLET UNDER THE TONGUE EVERY 5 MINUTES FOR 3 DOSES AS NEEDED  FOR  CHEST  PAIN (Patient not taking: Reported on 07/07/2018) 50 tablet 2  . ranitidine (ZANTAC) 150 MG tablet Take 150 mg by mouth daily.     No current facility-administered medications for this visit.     Functional Status:  In your present state of health, do you have any difficulty performing the following activities: 04/02/2018 01/08/2018  Hearing? N N  Vision? Y Y  Difficulty concentrating or making decisions? N Y  Walking or climbing stairs? Y Y  Comment - Need a rail  Dressing or bathing? N N  Doing errands, shopping? N N  Preparing Food and eating ? - N  Using the Toilet? - N  In the past six months, have you accidently leaked urine? - Y  Do you have problems with loss of bowel control? - N  Managing your Medications? - N  Managing your Finances? - N  Housekeeping or managing your Housekeeping? - Y  Some recent data might be hidden    Fall/Depression Screening: Fall Risk  07/07/2018 05/06/2018 04/02/2018  Falls in the past year? 0 No Yes  Number falls in past yr: - - 1  Injury with Fall? - - No  Risk for fall due to : - - -  Follow up - - Education provided;Falls evaluation completed   PHQ 2/9 Scores 04/02/2018 01/08/2018 12/16/2017  PHQ - 2 Score 0 0 0    Assessment:  Patient will continue to benefit from health coach outreach for disease management and support.  THN CM Care Plan Problem Three     Most Recent Value  Care Plan for Problem Three  Active  THN Long Term Goal   Patient will verbalize no hospital admissions  [facility] Long Term Goal Start Date  07/07/18  Interventions for Problem Three Long Term Goal  discussed with patient chf action plan, diet and medication mangement.       Plan:  Plan: RN Health Coach will contact patient in the month of December and patient agrees to next outreach.     Lazaro Arms RN, BSN, [place] Direct Dial:  [phone]  Fax: [phone]

## 2018-07-11 ENCOUNTER — Other Ambulatory Visit: Payer: Self-pay | Admitting: Cardiovascular Disease

## 2018-07-13 ENCOUNTER — Ambulatory Visit (INDEPENDENT_AMBULATORY_CARE_PROVIDER_SITE_OTHER): Payer: Medicare HMO | Admitting: Pharmacist

## 2018-07-13 DIAGNOSIS — I482 Chronic atrial fibrillation, unspecified: Secondary | ICD-10-CM

## 2018-07-13 DIAGNOSIS — Z5181 Encounter for therapeutic drug level monitoring: Secondary | ICD-10-CM | POA: Diagnosis not present

## 2018-07-13 LAB — POCT INR: INR: 5 — AB (ref 2.0–3.0)

## 2018-07-14 DIAGNOSIS — Z6827 Body mass index (BMI) 27.0-27.9, adult: Secondary | ICD-10-CM | POA: Diagnosis not present

## 2018-07-14 DIAGNOSIS — Z01419 Encounter for gynecological examination (general) (routine) without abnormal findings: Secondary | ICD-10-CM | POA: Diagnosis not present

## 2018-07-27 ENCOUNTER — Ambulatory Visit (INDEPENDENT_AMBULATORY_CARE_PROVIDER_SITE_OTHER): Payer: Medicare HMO | Admitting: Pharmacist Clinician (PhC)/ Clinical Pharmacy Specialist

## 2018-07-27 DIAGNOSIS — I4891 Unspecified atrial fibrillation: Secondary | ICD-10-CM | POA: Diagnosis not present

## 2018-07-27 DIAGNOSIS — Z5181 Encounter for therapeutic drug level monitoring: Secondary | ICD-10-CM | POA: Diagnosis not present

## 2018-07-27 DIAGNOSIS — I482 Chronic atrial fibrillation, unspecified: Secondary | ICD-10-CM

## 2018-07-27 LAB — POCT INR: INR: 3.3 — AB (ref 2.0–3.0)

## 2018-07-27 NOTE — Patient Instructions (Signed)
Description   HOLD warfarin dose today Monday Dec 9, then decrease dose to 5mg  daily except for 2.5 mg tablet every Monday, Wednesday and Friday. Recheck INR in 3 weeks.

## 2018-08-06 ENCOUNTER — Other Ambulatory Visit: Payer: Self-pay

## 2018-08-06 DIAGNOSIS — Z1231 Encounter for screening mammogram for malignant neoplasm of breast: Secondary | ICD-10-CM | POA: Diagnosis not present

## 2018-08-06 DIAGNOSIS — N8182 Incompetence or weakening of pubocervical tissue: Secondary | ICD-10-CM | POA: Diagnosis not present

## 2018-08-06 NOTE — Patient Outreach (Signed)
[facility]) Care Management  08/06/2018  KAMILE FASSLER 03/23/1923 [id number]    1st unsuccessful attempt to contact the patient for assessment. No answer.  HIPAA compliant voicemail left with contact information.  Plan: [place] will make outreach attempt the patient within thirty business days.  Lazaro Arms RN, BSN, [place] Direct Dial:  [phone]  Fax: [phone]

## 2018-08-10 ENCOUNTER — Other Ambulatory Visit: Payer: Self-pay | Admitting: Cardiovascular Disease

## 2018-08-14 DIAGNOSIS — Z7901 Long term (current) use of anticoagulants: Secondary | ICD-10-CM | POA: Diagnosis not present

## 2018-08-14 DIAGNOSIS — R8290 Unspecified abnormal findings in urine: Secondary | ICD-10-CM | POA: Diagnosis not present

## 2018-08-14 DIAGNOSIS — R58 Hemorrhage, not elsewhere classified: Secondary | ICD-10-CM | POA: Diagnosis not present

## 2018-08-14 DIAGNOSIS — R109 Unspecified abdominal pain: Secondary | ICD-10-CM | POA: Diagnosis not present

## 2018-08-17 ENCOUNTER — Ambulatory Visit (INDEPENDENT_AMBULATORY_CARE_PROVIDER_SITE_OTHER): Payer: Medicare HMO | Admitting: Pharmacist Clinician (PhC)/ Clinical Pharmacy Specialist

## 2018-08-17 DIAGNOSIS — Z5181 Encounter for therapeutic drug level monitoring: Secondary | ICD-10-CM

## 2018-08-17 DIAGNOSIS — I482 Chronic atrial fibrillation, unspecified: Secondary | ICD-10-CM

## 2018-08-17 LAB — POCT INR: INR: 2.1 (ref 2.0–3.0)

## 2018-08-17 NOTE — Patient Instructions (Signed)
Description   Continue 5mg  daily except for 2.5 mg tablet every Monday, Wednesday and Friday. Recheck INR in 4 weeks.

## 2018-08-24 ENCOUNTER — Other Ambulatory Visit: Payer: Self-pay | Admitting: *Deleted

## 2018-08-24 MED ORDER — POTASSIUM CHLORIDE CRYS ER 10 MEQ PO TBCR: EXTENDED_RELEASE_TABLET | ORAL | 2 refills | Status: DC

## 2018-08-28 ENCOUNTER — Telehealth: Payer: Self-pay | Admitting: Neurology

## 2018-08-28 DIAGNOSIS — G2581 Restless legs syndrome: Secondary | ICD-10-CM

## 2018-08-28 MED ORDER — PRAMIPEXOLE DIHYDROCHLORIDE 0.125 MG PO TABS: 0.1250 mg | ORAL_TABLET | Freq: Every evening | ORAL | 5 refills | Status: DC

## 2018-08-28 NOTE — Telephone Encounter (Signed)
I called pt and advised her that a refill on mirapex has been sent to [facility]. Pt verbalized understanding.

## 2018-08-28 NOTE — Telephone Encounter (Signed)
Pt is needing a refill on her pramipexole (MIRAPEX) 0.125 MG tablet sent to [facility]. Pt has run out of it today.

## 2018-09-03 ENCOUNTER — Telehealth: Payer: Self-pay | Admitting: Cardiovascular Disease

## 2018-09-03 NOTE — Telephone Encounter (Signed)
New message   Pt c/o BP issue: STAT if pt c/o blurred vision, one-sided weakness or slurred speech  1. What are your last 5 BP readings? 147/58, 107/52  Pt only has 2 from today, she misplaced her book   2. Are you having any other symptoms (ex. Dizziness, headache, blurred vision, passed out)? Has no energy, feels dizzy   3. What is your BP issue? BP has been low and pt doesn't doesn't take her high BP medication when it is low.  She is concerned because it has been low the last week

## 2018-09-03 NOTE — Telephone Encounter (Signed)
Pt states the last 3 day her BP has been running lower than normal but unable to give exact readings as she has misplaced her book. She reports today she believe it was 147/50 this morning, 127/58 this afternoon, and 98/58 this evening. She states she holds her atenolol on the days its running low and did not take it yesterday but took it today because she forgot to hold it. Pt states she has no energy and concerned. Will route to Pharm D.

## 2018-09-03 NOTE — Telephone Encounter (Signed)
Pt updated with Pharm D's recommendation. Verbalized understanding. Appointment with Jory Sims, DNP on 09/21/18 as Dr. Oval Linsey doesn't have anything available until April.

## 2018-09-03 NOTE — Telephone Encounter (Signed)
HOLD furosemide today and tomorrow, stay hydrated, and monitor BP twice daily.  Will check BO during coumadin visit on 09/14/2018.  Please schedule appt with Dr Oval Linsey; 65month F/U due on Feb/01/2019

## 2018-09-04 ENCOUNTER — Ambulatory Visit: Payer: Self-pay

## 2018-09-04 ENCOUNTER — Telehealth: Payer: Self-pay | Admitting: Cardiovascular Disease

## 2018-09-04 NOTE — Telephone Encounter (Signed)
Pt states she can't remember what medication she was instructed to hold. Informed pt that per Pharm D, she is to hold her lasix today and tomorrow. Pt verbalized understanding.

## 2018-09-04 NOTE — Telephone Encounter (Signed)
New Message   Pt cant remember what medications the nurse told her to not take Please call

## 2018-09-07 ENCOUNTER — Telehealth: Payer: Self-pay | Admitting: Cardiovascular Disease

## 2018-09-07 NOTE — Telephone Encounter (Signed)
New Message          Patient is needing a call back on 2 Rx "furosemide/"Isosorbide" She is needing clarification on the two medications.Pls call and advise

## 2018-09-07 NOTE — Telephone Encounter (Signed)
Spoke with pt, she is confused by the isosorbide bottle she got in the mail. She reports it reads 30 mg 3 tablets once daily. She also wanted to make sure she was supposed to restart the furosemide. I do not see a change in the chart for the isosorbide. Looking back in her chart it looks like she was on 90 mg of isosorbide in the past and it was decreased to 30 mg once daily due to hypotension. Explained to the patient to take 30 mg of isosorbide once daily and to restart furosemide at previous dosage.

## 2018-09-09 ENCOUNTER — Other Ambulatory Visit: Payer: Self-pay

## 2018-09-09 NOTE — Patient Outreach (Signed)
[facility]) Care Management  09/09/2018   Kristen Whitehead 1922-12-10 [id number]  Subjective: Successful outreach to the patient for assessment.  HIPAA verified. The patient states that she has been doing well for her age.  She denies any falls or pain.  She states that she has been having shortness of breath with exertion but no swelling or chest pain.  Discussed with the patient about her shortness and advised her to speak with her physician office.  She states that she has two appointments next week with her pharmacist and gynecology.  She is going to call and get an appointment with her primary care.  The patient states that she weighs 135.5 lbs.  She states that she eats two meals a day and is trying to monitor the salt in her diet.  She does but prepackaged meals because she does not feel like cooking.  She states that she takes her medication as prescribed.  She had her zantac changed and feels that it does not do the same job as before.  She has indigestion,cough and diarrhea.  Advised the patient to notify her PCP. She verbalized understanding.  She states that she gave her treadmill to her daughter so she is not dong any exercises.  She also wants some breathing exercises.  Current Medications:  Current Outpatient Medications  Medication Sig Dispense Refill  . atenolol (TENORMIN) 25 MG tablet Take 1 tablet (25 mg total) by mouth 2 (two) times daily. 180 tablet 3  . Calcium-Magnesium-Zinc 333-133-5 MG TABS Take 3 tablets by mouth daily.    . Cholecalciferol (VITAMIN D) 2000 UNITS tablet Take 4,000 Units by mouth daily.     . cycloSPORINE (RESTASIS) 0.05 % ophthalmic emulsion Place 1 drop into both eyes 2 (two) times daily.    . dorzolamide (TRUSOPT) 2 % ophthalmic solution Place 1 drop into the left eye 2 (two) times daily.    . furosemide (LASIX) 40 MG tablet TAKE 2 TABLETS TWICE DAILY 360 tablet 1  . isosorbide mononitrate (IMDUR) 30 MG 24 hr tablet Take 30 mg by  mouth daily.    Marland Kitchen latanoprost (XALATAN) 0.005 % ophthalmic solution Place 1 drop into both eyes at bedtime.     Marland Kitchen levothyroxine (SYNTHROID, LEVOTHROID) 100 MCG tablet Take 100 mcg by mouth daily.     . Multiple Vitamins-Minerals (CENTRUM SILVER PO) Take 1 tablet by mouth daily.    . Multiple Vitamins-Minerals (PRESERVISION AREDS 2 PO) Take 2 capsules by mouth.    . potassium chloride (K-DUR,KLOR-CON) 10 MEQ tablet TAKE 2 TABLETS EVERY DAY 180 tablet 2  . pramipexole (MIRAPEX) 0.125 MG tablet Take 1 tablet (0.125 mg total) by mouth every evening. 30 tablet 5  . PREMARIN vaginal cream Place 2 g vaginally once a week.    . Probiotic Product (PROBIOTIC DAILY PO) Take 1 tablet by mouth daily.    . ranitidine (ZANTAC) 150 MG tablet Take 150 mg by mouth daily.    . temazepam (RESTORIL) 15 MG capsule Take 15 mg by mouth at bedtime.    Marland Kitchen warfarin (COUMADIN) 5 MG tablet TAKE 1/2 TO 1 TABLET DAILY AS DIRECTED BY COUMADIN CLINIC 90 tablet 0  . nitroGLYCERIN (NITROSTAT) 0.4 MG SL tablet DISSOLVE 1 TABLET UNDER THE TONGUE EVERY 5 MINUTES FOR 3 DOSES AS NEEDED  FOR  CHEST  PAIN (Patient not taking: Reported on 07/07/2018) 50 tablet 2   No current facility-administered medications for this visit.     Functional Status:  In  your present state of health, do you have any difficulty performing the following activities: 04/02/2018 01/08/2018  Hearing? N N  Vision? Y Y  Difficulty concentrating or making decisions? N Y  Walking or climbing stairs? Y Y  Comment - Need a rail  Dressing or bathing? N N  Doing errands, shopping? N N  Preparing Food and eating ? - N  Using the Toilet? - N  In the past six months, have you accidently leaked urine? - Y  Do you have problems with loss of bowel control? - N  Managing your Medications? - N  Managing your Finances? - N  Housekeeping or managing your Housekeeping? - Y  Some recent data might be hidden    Fall/Depression Screening: Fall Risk  09/09/2018 07/07/2018  05/06/2018  Falls in the past year? 0 0 No  Number falls in past yr: - - -  Injury with Fall? - - -  Risk for fall due to : - - -  Follow up - - -   PHQ 2/9 Scores 04/02/2018 01/08/2018 12/16/2017  PHQ - 2 Score 0 0 0    Assessment: Patient will continue to benefit from health coach outreach for disease management and support. [facility] CM Care Plan Problem One     Most Recent Value  Care Plan Problem One  Knowledge deficit regarding management of heart failure   Role Documenting the Problem One  [place] for Problem One  Active  THN Long Term Goal   In 30 days the patient will verbalize no admission   [facility] Long Term Goal Start Date  09/09/18  Interventions for Problem One Long Term Goal  Discussed signs and symptoms of congested heart failure , chf action pllan and medication adherence     Plan: RN Health Coach will contact patient in the month of February and patient agrees to next outreach. Sent breathing exercises and chair exercises.  Lazaro Arms RN, BSN, [place] Direct Dial:  [phone]  Fax: [phone]

## 2018-09-14 ENCOUNTER — Other Ambulatory Visit: Payer: Self-pay | Admitting: Cardiovascular Disease

## 2018-09-14 DIAGNOSIS — C73 Malignant neoplasm of thyroid gland: Secondary | ICD-10-CM | POA: Insufficient documentation

## 2018-09-14 DIAGNOSIS — N765 Ulceration of vagina: Secondary | ICD-10-CM | POA: Diagnosis not present

## 2018-09-14 DIAGNOSIS — I519 Heart disease, unspecified: Secondary | ICD-10-CM | POA: Insufficient documentation

## 2018-09-14 DIAGNOSIS — N8182 Incompetence or weakening of pubocervical tissue: Secondary | ICD-10-CM | POA: Diagnosis not present

## 2018-09-14 NOTE — Telephone Encounter (Signed)
Please review for refill.  

## 2018-09-15 DIAGNOSIS — K219 Gastro-esophageal reflux disease without esophagitis: Secondary | ICD-10-CM | POA: Diagnosis not present

## 2018-09-15 DIAGNOSIS — K900 Celiac disease: Secondary | ICD-10-CM | POA: Diagnosis not present

## 2018-09-16 ENCOUNTER — Ambulatory Visit (INDEPENDENT_AMBULATORY_CARE_PROVIDER_SITE_OTHER): Payer: Medicare HMO | Admitting: Pharmacist

## 2018-09-16 DIAGNOSIS — I482 Chronic atrial fibrillation, unspecified: Secondary | ICD-10-CM

## 2018-09-16 DIAGNOSIS — Z5181 Encounter for therapeutic drug level monitoring: Secondary | ICD-10-CM | POA: Diagnosis not present

## 2018-09-16 LAB — POCT INR: INR: 2.1 (ref 2.0–3.0)

## 2018-09-20 NOTE — Progress Notes (Signed)
Cardiology Office Note   Date:  09/21/2018   ID:  Apryl, Brymer 31-May-1923, MRN [id number]  PCP:  Leighton Ruff, MD  Cardiologist:  Yulee  Chief Complaint  Patient presents with  . Follow-up     History of Present Illness: Kristen Whitehead is a 85 y.o. female who presents for ongoing assessment and management of chronic atrial fibrillation, HTN, HL, with mild MR, TR, and chronic diastolic CHF. She was last seen by Dr. Oval Linsey on 03/23/2018, and was noted not to tolerate Lexiscan stress tests in the past.   She has chronic chest pain which she describes as "heart attacks" that occurs when she overexerts herself.  This is associated with shortness of breath. She had been seen by me in the past with adjustments in her medications which helped her symptoms some.   She called our office on 09/03/2018 due to hypotension. She was advised to hold furosemide for two days, monitor BP twice a day. BP was to be checked on follow up coumadin visit. She is easily confused by her isosorbide dosage. She was advised to take 30 mg once daily and to restart her furosemide as she was taking in the past.   She comes today with multiple complaints. She admits that she does not take her medications regularly and often does not take them at the same time every day. She is also complaining of living on her own and not having help. She has a daughter who is wheelchair bound and a granddaughter with cancer. She states she has not opened her mail in months. It is piling up on her dining room table." I'm getting too old."   Past Medical History:  Diagnosis Date  . Bladder prolapse, female, acquired   . Cancer ([place])    thyroid  . Chest pain    no known ischemic heart disease; negative Myoview July 2013. EF 75% with no ischemia.   . Chronic anticoagulation   . Chronic atrial fibrillation    managed with rate control and coumadin  . Chronic diastolic heart failure ([place])   . GERD (gastroesophageal  reflux disease)   . Heart disease   . History of congenital mitral regurgitation    mild  . History of mitral valve prolapse 06/15/2008   a. echo 1/14: mild LVH, EF 60-65%, mild MR, mild to mod BAE, PASP 35  . Hypercholesterolemia   . Hypertension   . Hypothyroidism     Past Surgical History:  Procedure Laterality Date  . ABDOMINAL HYSTERECTOMY    . CATARACT EXTRACTION    . CHOLECYSTECTOMY    . Thyroidectomy       Current Outpatient Medications  Medication Sig Dispense Refill  . atenolol (TENORMIN) 25 MG tablet Take 1 tablet (25 mg total) by mouth 2 (two) times daily. 180 tablet 3  . Calcium-Magnesium-Zinc 333-133-5 MG TABS Take 3 tablets by mouth daily.    . Cholecalciferol (VITAMIN D) 2000 UNITS tablet Take 4,000 Units by mouth daily.     . cycloSPORINE (RESTASIS) 0.05 % ophthalmic emulsion Place 1 drop into both eyes 2 (two) times daily.    . dorzolamide (TRUSOPT) 2 % ophthalmic solution Place 1 drop into the left eye 2 (two) times daily.    . furosemide (LASIX) 40 MG tablet Take 1 tablet (40 mg total) by mouth 2 (two) times daily. 90 tablet 1  . isosorbide mononitrate (IMDUR) 30 MG 24 hr tablet Take 30 mg by mouth daily.    Marland Kitchen latanoprost (  XALATAN) 0.005 % ophthalmic solution Place 1 drop into both eyes at bedtime.     Marland Kitchen levothyroxine (SYNTHROID, LEVOTHROID) 100 MCG tablet Take 100 mcg by mouth daily.     . Multiple Vitamins-Minerals (CENTRUM SILVER PO) Take 1 tablet by mouth daily.    . Multiple Vitamins-Minerals (PRESERVISION AREDS 2 PO) Take 2 capsules by mouth.    . nitroGLYCERIN (NITROSTAT) 0.4 MG SL tablet DISSOLVE 1 TABLET UNDER THE TONGUE EVERY 5 MINUTES FOR 3 DOSES AS NEEDED  FOR  CHEST  PAIN 50 tablet 2  . potassium chloride (K-DUR,KLOR-CON) 10 MEQ tablet TAKE 2 TABLETS EVERY DAY 180 tablet 2  . pramipexole (MIRAPEX) 0.125 MG tablet Take 1 tablet (0.125 mg total) by mouth every evening. 30 tablet 5  . PREMARIN vaginal cream Place 2 g vaginally once a week.    .  Probiotic Product (PROBIOTIC DAILY PO) Take 1 tablet by mouth daily.    . ranitidine (ZANTAC) 150 MG tablet Take 150 mg by mouth daily.    . temazepam (RESTORIL) 15 MG capsule Take 15 mg by mouth at bedtime.    Marland Kitchen warfarin (COUMADIN) 5 MG tablet TAKE 1/2 TO 1 TABLET DAILY AS DIRECTED BY COUMADIN CLINIC 90 tablet 0   No current facility-administered medications for this visit.     Allergies:   Azithromycin; Compazine [prochlorperazine edisylate]; Demerol; Dolophine [methadone]; Ebastine; Erythromycin; Ibuprofen; Statins; Tetanus toxoids; and Tetracyclines & related    Social History:  The patient  reports that she has never smoked. She has never used smokeless tobacco. She reports that she does not drink alcohol or use drugs.   Family History:  The patient's family history includes Stroke in her father and mother.    ROS: All other systems are reviewed and negative. Unless otherwise mentioned in H&P    PHYSICAL EXAM: VS:  BP 118/64 (BP Location: Left Arm, Patient Position: Sitting, Cuff Size: Normal)   Pulse 81   Ht 4\' 11"  (1.499 m)   Wt 134 lb 3.2 oz (60.9 kg)   BMI 27.11 kg/m  , BMI Body mass index is 27.11 kg/m. GEN: Well nourished, well developed, in no acute distress HEENT: normal Neck: no JVD, carotid bruits, or masses Cardiac: RRR; no murmurs, rubs, or gallops,no edema  Respiratory:  Clear to auscultation bilaterally, normal work of breathing GI: soft, nontender, nondistended, + BS MS: no deformity or atrophy Skin: warm and dry, no rash Neuro:  Strength and sensation are intact Psych: euthymic mood, full affect   EKG:  Not completed this office visit.   Recent Labs: 01/16/2018: ALT 14; BUN 12; Creatinine, Ser 0.81; Hemoglobin 14.2; NT-Pro BNP 1,923; Platelets 238; Potassium 4.4; Sodium 140    Lipid Panel    Component Value Date/Time   CHOL 211 (H) 11/04/2012 1524   TRIG 201.0 (H) 11/04/2012 1524   HDL 66.80 11/04/2012 1524   CHOLHDL 3 11/04/2012 1524   VLDL  40.2 (H) 11/04/2012 1524   LDLCALC 109 (H) 04/16/2012 1043   LDLDIRECT 98.8 11/04/2012 1524      Wt Readings from Last 3 Encounters:  09/21/18 134 lb 3.2 oz (60.9 kg)  03/23/18 134 lb 6.4 oz (61 kg)  02/05/18 133 lb (60.3 kg)      Other studies Reviewed:  Echo 11/11/14: Study Conclusions  - Left ventricle: The cavity size was normal. Wall thickness was normal. Systolic function was normal. The estimated ejection fraction was in the range of 60% to 65%. - Aortic valve: There was mild regurgitation. -  Mitral valve: Calcified annulus. Mildly thickened leaflets . There was mild regurgitation. - Left atrium: The atrium was mildly dilated. - Right atrium: The atrium was mildly dilated.  ASSESSMENT AND PLAN:  1.  Chronic Atrial fibrillation: She is rate controlled currently. INR was checked last week and was therapeutic.   2. Hypertension:  She is hypotensive today. She is listed as taking 40 mg BID. I think she is on too much diuretic therapy, she has normal EF. Despite atrial fib, she is not fluid overloaded on examination. I will reduce her lasix to 20 mg BID. Likely need to reduce further. She is not taking her medications regularly anyway.   3. Medical non-compliance: I suspect she has an element of dementia which is contributing. I will have [facility] see her to assist her with some community assistance as she does not feel she has family support concerning help with her care, as family has illness which restrict them being able to take care of her.    Current medicines are reviewed at length with the patient today.    Labs/ tests ordered today include: None  Phill Myron. [place] Pugh, ANP, AACC   09/21/2018 4:57 PM    [place] [address] Office [phone] Fax [phone]

## 2018-09-21 ENCOUNTER — Ambulatory Visit: Payer: Medicare HMO | Admitting: Adult Health

## 2018-09-21 ENCOUNTER — Encounter: Payer: Self-pay | Admitting: Adult Health

## 2018-09-21 VITALS — BP 118/64 | HR 81 | Ht 59.0 in | Wt 134.2 lb

## 2018-09-21 DIAGNOSIS — I482 Chronic atrial fibrillation, unspecified: Secondary | ICD-10-CM | POA: Diagnosis not present

## 2018-09-21 DIAGNOSIS — I5032 Chronic diastolic (congestive) heart failure: Secondary | ICD-10-CM | POA: Diagnosis not present

## 2018-09-21 DIAGNOSIS — I952 Hypotension due to drugs: Secondary | ICD-10-CM | POA: Diagnosis not present

## 2018-09-21 MED ORDER — FUROSEMIDE 40 MG PO TABS: 40.0000 mg | ORAL_TABLET | Freq: Two times a day (BID) | ORAL | 1 refills | Status: DC

## 2018-09-21 NOTE — Patient Instructions (Signed)
Medication Instructions:  DECREASE LASIX 40MG  TWICE DAILY If you need a refill on your cardiac medications before your next appointment, please call your pharmacy.  Labwork: NONE When you have your labs (blood work) drawn today and your tests are completely normal, you will receive your results only by MyChart Message (if you have MyChart) -OR-  A paper copy in the mail.  If you have any lab test that is abnormal or we need to change your treatment, we will call you to review these results.  Special Instructions: [facility] WILL BE CALLING YOU TO SCHEDULE A HOME APPT  Follow-Up: You will need a follow up appointment [place], MD or one of the following Advanced Practice Providers on your designated Care Team:  Kerin Ransom, [place]  Roby Lofts, PA-C  Sande Rives, [place]  At [facility], you and your health needs are our priority.  As part of our continuing mission to provide you with exceptional heart care, we have created designated Provider Care Teams.  These Care Teams include your primary Cardiologist (physician) and Advanced Practice Providers (APPs -  Physician Assistants and Nurse Practitioners) who all work together to provide you with the care you need, when you need it.  Thank you for choosing [facility] at [facility]!!

## 2018-09-22 ENCOUNTER — Other Ambulatory Visit: Payer: Self-pay

## 2018-09-22 NOTE — Patient Outreach (Signed)
[facility]) Care Management  09/22/2018  LEONORE FRANKSON October 19, 1922 [id number]    Telephone Screen Referral Date : 09/22/2018 Referral Source: Physician referral Referral Reason: Needs assistance Insurance: [facility]   Outreach attempt # 1 To patient. HIPAA verified.  The patient states that she is at  home resting.  Which she states is hard to do because she lives alone.  She states that she is not able to clean, do laundry and grocery shop. It is to strenuous and she does not any support to help her.  Her daughter is wheelchair bound and her grand daughter is dealing with cancer. She states that she forgets to take her medications.  She has a pill box that she fills every two weeks but still forgets.  She states that she stays up late at night and wakes up late in the morning and it throws her medications times off.  I discussed with the patient about making a referral to social work and pharmacy to see what options she has.  She was very receptive and appreciative of the suggestion.  Plan: RN Health Coach will make a referral to social work for [employer] and pharmacy for medication management.  I explained the process to the patient and that she may need share her financial information the patient states that she would share her information.  RN Health Coach will outreach the patient at the next scheduled interval.  Lazaro Arms RN, BSN, [place] Direct Dial:  [phone] Fax: [phone]

## 2018-09-25 ENCOUNTER — Other Ambulatory Visit: Payer: Self-pay

## 2018-09-25 NOTE — Patient Outreach (Signed)
[facility]) Care Management  09/25/2018  NABA SNEED 11-20-1922 [id number]  Successful outreach to Ms. Stansbury.  HIPAA identifiers verified.  She states that she is still in bed and requests that I call her back at noon.   Reason for referral: Medication Management  Referral source: Dr. Drema Dallas.  Current insurance:[employer]  PMHx includes but not limited to:  atrial fibrillation, hypertension, diastolic heart failure, hypothyroidism and hypercholesterolemia  Outreach:  Successful telephone call with Ms. Ausmus.  HIPAA identifiers verified.   Subjective:  Ms. Poth reports that she did not sleep very well last night and does not feel her best today. She states that her blood pressure is high "177/over something".  She states that she has not taken her atenolol in 3 days because it had been low "top number <100 and bottom number less than 50".  She states that she is not sure that her blood pressure readings are correct because she has a wrist monitor and has very small wrists. She states that she has ordered a new small arm blood pressure monitor and her son-in-law is bringing it over this afternoon.  She states that she has since taken her morning medications and will check it again when she receives the monitor.  She states she will call her doctor if still high.  Ms. Foglesong reports that she stays up until at least 1 am every night and sleeps late.  She admits to not taking her medications on a set schedule.  She states that she fills her own pill box every two weeks and that she can afford her prescriptions.   Objective: Lab Results  Component Value Date   CREATININE 0.81 01/16/2018   CREATININE 0.77 01/13/2018   CREATININE 0.83 12/19/2017    No results found for: HGBA1C  Lipid Panel     Component Value Date/Time   CHOL 211 (H) 11/04/2012 1524   TRIG 201.0 (H) 11/04/2012 1524   HDL 66.80 11/04/2012 1524   CHOLHDL 3 11/04/2012 1524   VLDL 40.2  (H) 11/04/2012 1524   LDLCALC 109 (H) 04/16/2012 1043   LDLDIRECT 98.8 11/04/2012 1524    BP Readings from Last 3 Encounters:  09/21/18 118/64  04/16/18 118/64  03/23/18 138/81    Allergies  Allergen Reactions  . Azithromycin Nausea Only  . Compazine [Prochlorperazine Edisylate] Nausea Only  . Demerol Nausea Only  . Dolophine [Methadone] Nausea Only  . Ebastine Nausea Only    EBS  . Erythromycin Nausea Only  . Ibuprofen Nausea Only  . Statins Other (See Comments)    myalgias  . Tetanus Toxoids Nausea Only  . Tetracyclines & Related Nausea Only    Medications Reviewed Today    Reviewed by Dionne Milo, [facility] (Pharmacist) on 09/25/18 at 1226  Med List Status: <None>  Medication Order Taking? Sig Documenting Provider Last Dose Status Informant  atenolol (TENORMIN) 25 MG tablet [id number] Yes Take 1 tablet (25 mg total) by mouth 2 (two) times daily. [name], MD Taking Active   Calcium Carbonate-Vitamin D (CALCIUM 600+D PO) [id number] Yes Take 1 tablet by mouth 2 (two) times daily. [provider] Taking Active         Discontinued 09/25/18 1220 (Patient Preference)   Cholecalciferol (VITAMIN D) 2000 UNITS tablet [id number] Yes Take 4,000 Units by mouth daily.  [provider] Taking Active Self  cycloSPORINE (RESTASIS) 0.05 % ophthalmic emulsion [id number] Yes Place 1 drop into both eyes 2 (two) times daily. [provider] Taking  Active   dorzolamide (TRUSOPT) 2 % ophthalmic solution [id number] Yes Place 1 drop into the left eye 2 (two) times daily. [provider] Taking Active   furosemide (LASIX) 40 MG tablet [id number] Yes Take 1 tablet (40 mg total) by mouth 2 (two) times daily. Lendon Colonel, NP Taking Active   isosorbide mononitrate (IMDUR) 30 MG 24 hr tablet [id number] Yes Take 30 mg by mouth daily. [provider] Taking Active   latanoprost (XALATAN) 0.005 % ophthalmic solution [id number] Yes Place 1 drop into  both eyes at bedtime.  [provider] Taking Active Self  levothyroxine (SYNTHROID, LEVOTHROID) 100 MCG tablet [id number] Yes Take 100 mcg by mouth daily.  [provider] Taking Active            Med Note Olena Heckle, MICHELE   Mon Nov 18, 2016 10:47 AM)    Multiple Vitamins-Minerals (CENTRUM SILVER PO) [id number] Yes Take 1 tablet by mouth daily. [provider] Taking Active   Multiple Vitamins-Minerals (PRESERVISION AREDS 2 PO) [id number] Yes Take 1 capsule by mouth 2 (two) times daily.  [provider] Taking Active Self  nitroGLYCERIN (NITROSTAT) 0.4 MG SL tablet [id number] No DISSOLVE 1 TABLET UNDER THE TONGUE EVERY 5 MINUTES FOR 3 DOSES AS NEEDED  FOR  CHEST  PAIN  Patient not taking:  Reported on 09/25/2018   [name], MD Not Taking Active   potassium chloride (K-DUR,KLOR-CON) 10 MEQ tablet [id number] Yes TAKE 2 TABLETS EVERY DAY [name], MD Taking Active   pramipexole (MIRAPEX) 0.125 MG tablet [id number] Yes Take 1 tablet (0.125 mg total) by mouth every evening. Star Age, MD Taking Active   PREMARIN vaginal cream [id number] Yes Place 2 g vaginally once a week. [provider] Taking Active   Probiotic Product (PROBIOTIC DAILY PO) [id number] Yes Take 1 tablet by mouth daily. [provider] Taking Active   ranitidine (ZANTAC) 150 MG tablet [id number] Yes Take 150 mg by mouth daily. [provider] Taking Active Self  temazepam (RESTORIL) 15 MG capsule [id number] Yes Take 15 mg by mouth at bedtime. [provider] Taking Active Self  warfarin (COUMADIN) 5 MG tablet [id number] Yes TAKE 1/2 TO 1 TABLET DAILY AS DIRECTED BY COUMADIN CLINIC [name], MD Taking Active          Assessment:  Drugs sorted by system:  Neurologic/Psychologic: pramipexole, temazepam  Cardiovascular: atenolol, furosemide, isosorbide mononitrate, nitroglycerin, potassium chloride, warfarin  Gastrointestinal: probiotic,  ranitidine   Endocrine: levothyroxine   Topical: cyclosporine, dorzolamide, latanoprost, premarin vaginal cream  Vitamins/Minerals/Supplements: calcium/vitamin D, MVI, Perservision Areds  Medication Review Findings:  . Per the [place] List, temazepam may have decreased metabolism and increases sensitivity in older adults.  Risk of cognitive impairment, delirium, falls, fractures and motor vehicle accidents may occur. There is strong evidence to avoid use in the elderly. . Counseled the patient about the importance of getting on a schedule with her medications to help avoid highs and lows in blood pressures.  Encouraged her to set an alarm each morning and get up, get active and take her medications to help turn her sleep cycle around.  She verbalized understanding and states that she had wondered if the way she is taking her medications was causing her variations in blood pressure. She is keeping a blood pressure log.   Medication Assistance Findings:  No medication assistance needs identified  Plan: Will follow up with patient next week to see if she is taking her medication on schedule and check  on her blood pressures.   Joetta Manners, PharmD Clinical Pharmacist [place] [phone]

## 2018-09-28 ENCOUNTER — Telehealth: Payer: Self-pay | Admitting: Cardiovascular Disease

## 2018-09-28 NOTE — Telephone Encounter (Signed)
New Message   Pt c/o BP issue: STAT if pt c/o blurred vision, one-sided weakness or slurred speech  1. What are your last 5 BP readings? FRI 190/83 HR65, SAT 182/96 HR67, SUN 190/83 HR63, MON 178/114 HR72  2. Are you having any other symptoms (ex. Dizziness, headache, blurred vision, passed out)? No  3. What is your BP issue? PT bought a new blood pressure machine because she thought the readings were too high but they are still reading high  PT states she doesn't feel good, she has been trying to rest  She also states she is taking her medication when she is suppose too  She would like to know what to do now that it is so high and it use to be low

## 2018-09-28 NOTE — Telephone Encounter (Signed)
Patient was instructed to decrease furosemide dose to 20mg  BID during most recent OV.  Patient wrist cuff was determined NOT accurate during most recent OV.  Taking 20mg  BID or 40mg  BID?  Recommendation:  1. Resume taking furosemide 40mg  twice daily.  2. Continue all other medication as prescribed.  3. Schedule patient for f/u visit ASAP with APP or PharmD. Bring all current medication and BP cuff.

## 2018-09-28 NOTE — Telephone Encounter (Signed)
Will forward to Raquel Pharm D for review ./cy

## 2018-09-29 ENCOUNTER — Ambulatory Visit (INDEPENDENT_AMBULATORY_CARE_PROVIDER_SITE_OTHER): Payer: Medicare HMO | Admitting: Pharmacist Clinician (PhC)/ Clinical Pharmacy Specialist

## 2018-09-29 DIAGNOSIS — I10 Essential (primary) hypertension: Secondary | ICD-10-CM | POA: Diagnosis not present

## 2018-09-29 NOTE — Telephone Encounter (Signed)
Spoke with patient and she was taking 80 mg twice a day. Discussed with Raquel Pharm D and will have her increase back to that until visit. Patient aware and will bring blood pressure machine to appointment

## 2018-09-29 NOTE — Patient Instructions (Addendum)
We will review your home BP readings when you come for your next coumadin check  Go to the lab in 10-14 days for repeat kidney function test - when you come for coumadin check  Your blood pressure today is 90/60  Check your blood pressure at home daily (if able) and keep record of the readings.  Take your medications as follows:  Decrease furosemide to 1/2 tablet (1/2 of 40 mg tablet = 20 mg) twice daily  Decrease potassium to 1 tablet daily   Bring all of your meds, your BP cuff and your record of home blood pressures to your next appointment.  Exercise as you're able, try to walk approximately 30 minutes per day.  Keep salt intake to a minimum, especially watch canned and prepared boxed foods.  Eat more fresh fruits and vegetables and fewer canned items.  Avoid eating in fast food restaurants.    HOW TO TAKE YOUR BLOOD PRESSURE: . Rest 5 minutes before taking your blood pressure. .  Don't smoke or drink caffeinated beverages for at least 30 minutes before. . Take your blood pressure before (not after) you eat. . Sit comfortably with your back supported and both feet on the floor (don't cross your legs). . Elevate your arm to heart level on a table or a desk. . Use the proper sized cuff. It should fit smoothly and snugly around your bare upper arm. There should be enough room to slip a fingertip under the cuff. The bottom edge of the cuff should be 1 inch above the crease of the elbow. . Ideally, take 3 measurements at one sitting and record the average.

## 2018-09-29 NOTE — Progress Notes (Signed)
09/30/2018 JONAH NESTLE Mar 15, 1923 [id number]   HPI:  Kristen Whitehead is a 85 y.o. female patient of Dr Oval Linsey, with a [place] below who presents today for hypertension clinic evaluation.  In addition to hypertension, her medical history is significant for hyperlipidemia, chronic diastolic heart failure, AF and hypothyroidism.    When she was seen by Jory Sims on Feb 3, her BP was normal at 118/64.  There was concern that she might be on too high dose of diuretic and her furosemide dose was cut in half.  Apparently the confusion around this was whether 40 mg bid was the dose prior to cutting it back or after.    Blood Pressure Goal:  130/80  Current Medications:  Atenolol 25 mg bid  Furosemide 40 mg bid  Isosorbide mono 30 mg qd  Diet:  Patient describes her diet as low sodium; likes [place] food and occasionally eats at [employer].  Most weeks she will buy a rotisserie chicken and eat from it for 4-5 days.  Doesn't eat fried foods.   Exercise:  Non   Home BP readings:  Home readings mostly  322-025 systolic, but does have spells that increase to 427-062 systolic and occasionally some hypotension with 90-110.   She notes feeling weak/tired when she becomes hypotensive, then gets somewhat stressed when she sees the 180-200.    Intolerances:   Multiple, other than statins, no cardiac medication intolerances  Labs:  12/2017:  Na 140, K 4.4, Glu 83, BUN 12, SCr .081, GFR 63  Wt Readings from Last 3 Encounters:  09/29/18 134 lb 6.4 oz (61 kg)  09/21/18 134 lb 3.2 oz (60.9 kg)  03/23/18 134 lb 6.4 oz (61 kg)   BP Readings from Last 3 Encounters:  09/29/18 90/60  09/21/18 118/64  04/16/18 118/64   Pulse Readings from Last 3 Encounters:  09/29/18 72  09/21/18 81  04/16/18 64    Current Outpatient Medications  Medication Sig Dispense Refill  . atenolol (TENORMIN) 25 MG tablet Take 1 tablet (25 mg total) by mouth 2 (two) times daily. 180 tablet 3  . Calcium  Carbonate-Vitamin D (CALCIUM 600+D PO) Take 1 tablet by mouth 2 (two) times daily.    . Cholecalciferol (VITAMIN D) 2000 UNITS tablet Take 4,000 Units by mouth daily.     . cycloSPORINE (RESTASIS) 0.05 % ophthalmic emulsion Place 1 drop into both eyes 2 (two) times daily.    . dorzolamide (TRUSOPT) 2 % ophthalmic solution Place 1 drop into the left eye 2 (two) times daily.    . furosemide (LASIX) 40 MG tablet Take 1 tablet (40 mg total) by mouth 2 (two) times daily. 90 tablet 1  . isosorbide mononitrate (IMDUR) 30 MG 24 hr tablet Take 30 mg by mouth daily.    Marland Kitchen latanoprost (XALATAN) 0.005 % ophthalmic solution Place 1 drop into both eyes at bedtime.     Marland Kitchen levothyroxine (SYNTHROID, LEVOTHROID) 100 MCG tablet Take 100 mcg by mouth daily.     . Multiple Vitamins-Minerals (CENTRUM SILVER PO) Take 1 tablet by mouth daily.    . Multiple Vitamins-Minerals (PRESERVISION AREDS 2 PO) Take 1 capsule by mouth 2 (two) times daily.     . nitroGLYCERIN (NITROSTAT) 0.4 MG SL tablet DISSOLVE 1 TABLET UNDER THE TONGUE EVERY 5 MINUTES FOR 3 DOSES AS NEEDED  FOR  CHEST  PAIN 50 tablet 2  . potassium chloride (K-DUR,KLOR-CON) 10 MEQ tablet TAKE 2 TABLETS EVERY DAY 180 tablet 2  .  pramipexole (MIRAPEX) 0.125 MG tablet Take 1 tablet (0.125 mg total) by mouth every evening. 30 tablet 5  . PREMARIN vaginal cream Place 2 g vaginally once a week.    . Probiotic Product (PROBIOTIC DAILY PO) Take 1 tablet by mouth daily.    . ranitidine (ZANTAC) 150 MG tablet Take 150 mg by mouth daily.    . temazepam (RESTORIL) 15 MG capsule Take 15 mg by mouth at bedtime.    Marland Kitchen warfarin (COUMADIN) 5 MG tablet TAKE 1/2 TO 1 TABLET DAILY AS DIRECTED BY COUMADIN CLINIC 90 tablet 0   No current facility-administered medications for this visit.     Allergies  Allergen Reactions  . Azithromycin Nausea Only  . Compazine [Prochlorperazine Edisylate] Nausea Only  . Demerol Nausea Only  . Dolophine [Methadone] Nausea Only  . Ebastine Nausea  Only    EBS  . Erythromycin Nausea Only  . Ibuprofen Nausea Only  . Statins Other (See Comments)    myalgias  . Tetanus Toxoids Nausea Only  . Tetracyclines & Related Nausea Only    Past Medical History:  Diagnosis Date  . Bladder prolapse, female, acquired   . Cancer ([place])    thyroid  . Chest pain    no known ischemic heart disease; negative Myoview July 2013. EF 75% with no ischemia.   . Chronic anticoagulation   . Chronic atrial fibrillation    managed with rate control and coumadin  . Chronic diastolic heart failure ([place])   . GERD (gastroesophageal reflux disease)   . Heart disease   . History of congenital mitral regurgitation    mild  . History of mitral valve prolapse 06/15/2008   a. echo 1/14: mild LVH, EF 60-65%, mild MR, mild to mod BAE, PASP 35  . Hypercholesterolemia   . Hypertension   . Hypothyroidism     Blood pressure 90/60, pulse 72, resp. rate 14, height 4\' 11"  (1.499 m), weight 134 lb 6.4 oz (61 kg).  Essential hypertension Patient with essential hypertension mixed with occasional hypotensive episodes.  She has no know neurologic issues that would explain hypotensive episodes.  Suspect that they may be in part due to over-diuresis.  While she does have some hypertensive episodes, I am reluctant to give her any further medications until we can determine cause or prevent the hypotension.  For now I will decrease her furosemide to 20 mg twice daily.  We will check in with her when she comes back for an INR check in 2 weeks, and repeat BMET at that time.  She may eventually need a small dose of ACEI/ARB.     Tommy Medal PharmD CPP [facility] [address] [place], [place] [address] [phone]

## 2018-09-30 NOTE — Assessment & Plan Note (Addendum)
Patient with essential hypertension mixed with occasional hypotensive episodes.  She has no know neurologic issues that would explain hypotensive episodes.  Suspect that they may be in part due to over-diuresis.  While she does have some hypertensive episodes, I am reluctant to give her any further medications until we can determine cause or prevent the hypotension.  For now I will decrease her furosemide to 20 mg twice daily.  We will check in with her when she comes back for an INR check in 2 weeks, and repeat BMET at that time.  She may eventually need a small dose of ACEI/ARB.

## 2018-10-01 ENCOUNTER — Encounter: Payer: Self-pay | Admitting: *Deleted

## 2018-10-01 ENCOUNTER — Other Ambulatory Visit: Payer: Self-pay | Admitting: *Deleted

## 2018-10-01 NOTE — Patient Outreach (Signed)
[facility]) Care Management  10/01/2018  SUNDEE GARLAND 1923/07/18 [id number]   CSW was able to make initial contact with patient today to perform phone assessment, as well as assess and assist with social work needs and services.  CSW introduced self, explained role and types of services provided through [facility] ([facility]).  CSW further explained to patient that CSW works with patient's [place], also with [facility], [facility]. Mrs. Coles admitted that patient's Primary Care Physician, Dr. Leighton Ruff made the initial referral, concerned about patient's health and well-being.  CSW then explained the reason for the call, indicating that Mrs. Cheryll Cockayne thought that patient would benefit from social work services and resources to assist with arranging in-home care services.  CSW obtained two HIPAA compliant identifiers from patient, which included patient's name and date of birth.  Patient admitted that her blood pressure has been running low lately and that she really has not had the energy to perform certain activities of daily living, such as clean the house, do the laundry, perform grocery shopping, etc.  Patient currently lives at home alone and reported that she receives minimal support from her children, daughter - Guido Sander [phone] and son - Parker Sawatzky [phone]), as they both have their own lives. Patient indicated that Mrs. Ron Parker is wheelchair bound and consumed with assisting her daughter who is currently battling with cancer.  Patient also admitted that she often forgets to take her prescription medications, needing someone to remind her, as well as set up her pill box.  CSW noted that a referral was made to Joetta Manners, Pharmacist, also with [facility].  CSW explained to patient that CSW would be more than happy to mail patient a list of in-home care  agencies, as well as assist with the referral process, but that patient would be responsible for paying for these services out-of-pocket.  CSW went on to say that unless patient has a long-term care insurance policy or eligible to receive [employer] benefits, patient would need to be prepared to pay for these services, as patient's [facility] does not cover the expense.  Patient reported that she already knows that she would not qualify for Adult Medicaid through the [facility].  Patient was somewhat disappointed to hear this, admitting that she is on a fixed income and unable to afford any added expenses each month.  Patient did not wish for CSW to mail her a list of in-home care agencies, reporting that it would be a waste of time.  CSW apologized to patient that there are currently no services available that accept Medicare to assist patient's in the home.  Patient voiced understanding and was most appreciative of the call.  CSW will perform a case closure on patient, as all goals of treatment have been met from social work standpoint and no additional social work needs have been identified at this time.  CSW will notify patient's [place] with [facility] of CSW's plans to close patient's case.  CSW will fax an update to patient's Primary Care Physician, Dr. Leighton Ruff to ensure that they are aware of CSW's involvement with patient's plan of care.  CSW was able to ensure that patient has the correct contact information for CSW, encouraging patient to contact CSW directly if additional social work needs arise in the near future or if  patient changes her mind about wanting to receive in-home care services.   Nat Christen, BSW, MSW, LCSW  Licensed Education officer, [facility]  Mailing [place] [address], [place], [place] [address] Physical  Address-[address], [place], [place] [address] Toll Free Main # [phone] Fax # [phone] Cell # [phone]  Office # [phone] [email or web address]

## 2018-10-02 ENCOUNTER — Ambulatory Visit: Payer: Self-pay

## 2018-10-02 ENCOUNTER — Telehealth: Payer: Self-pay | Admitting: Cardiovascular Disease

## 2018-10-02 ENCOUNTER — Other Ambulatory Visit: Payer: Self-pay

## 2018-10-02 NOTE — Telephone Encounter (Signed)
New Message   Pt c/o medication issue:  1. Name of Medication: Potassium  2. How are you currently taking this medication (dosage and times per day)? 10 MEQ twice a day  3. Are you having a reaction (difficulty breathing--STAT)? no  4. What is your medication issue? Patient suppose to take 2 a day and was taking 4 a day and wants to speak to a nurse.

## 2018-10-02 NOTE — Patient Outreach (Signed)
[facility]) Care Management  10/02/2018  JULIAHNA WISWELL 1923/03/11 [id number]  Successful outreach to Ms. Fredenburg.  HIPAA identifiers verified.  Ms. Keach reports that she has not been feeling well due to fluctuations in her blood pressures.  She state that she saw her cardiology pharmacist this week and that her blood pressure medications were changed.  She states that she is cutting her 40 mg furosemide tablets and taking 1/2 tablet (20mg ) twice daily, along with 1 potassium chloride tablet daily.  This is accurate according to the note in the EMR from 09/29/18.  Patient reports difficulty cutting the tablets in half.  She states that the pharmacist looked at her new electronic blood pressure cuff and that it is appropriate for her size.  Patient reports that she does not have anyone to help her with her medications.  She packs her own pill box every two weeks and uses [employer] order.  She is not interested in compliance pill packaging and due to her frequent changes in blood pressure medications, it is not ideal for her at this time.  Patient reported that she recently, accidentally poured her atenolol and furosemide into the same bottle and she spent an hour using a magnifying glass separating the pills apart.  She states that she was able to read the 40 on the furosemide tablet and knew that it was a bigger pill than the atenolol.  She states that her family members are "fighting battles of their own," so she does not ask them for help, but she does have a friend that can take her to appointments if she feels like she can't drive herself.  She states that she will see her cardiology pharmacist in ~ 2 weeks.    Patient requests to be called after lunch because she sleeps late.  She reports that she does have the appropriate time interval between her medications.  She states states that if she takes her atenolol at 10 am she will take the next dose at 10 pm.      Plan: Outreach to Ms. Nickelson in one month.   Joetta Manners, PharmD Clinical Pharmacist [place] [phone]

## 2018-10-02 NOTE — Telephone Encounter (Signed)
Spoke with pt, she got confused with her medications and has been taking an increase in the potassium. She is not going to take 1/2 the dose because she was on the wrong dose. She wanted to let the pharmacist know. Will forward to pharm md

## 2018-10-05 NOTE — Telephone Encounter (Signed)
Spoke with patient.  She had been taking 40 mEq of potassium daily instead of 20 mEq.  She should still cut to 10 mEq daily until we get BMET next week.  Stay at furosemide 20 mg bid.    Some confusion going back and forth as conversation kept switching between dosing for potassium and furosemide.

## 2018-10-06 DIAGNOSIS — N816 Rectocele: Secondary | ICD-10-CM | POA: Diagnosis not present

## 2018-10-06 DIAGNOSIS — N8110 Cystocele, unspecified: Secondary | ICD-10-CM | POA: Diagnosis not present

## 2018-10-06 DIAGNOSIS — N8182 Incompetence or weakening of pubocervical tissue: Secondary | ICD-10-CM | POA: Diagnosis not present

## 2018-10-08 DIAGNOSIS — H8110 Benign paroxysmal vertigo, unspecified ear: Secondary | ICD-10-CM | POA: Diagnosis not present

## 2018-10-10 ENCOUNTER — Encounter (HOSPITAL_COMMUNITY): Payer: Self-pay | Admitting: Emergency Medicine

## 2018-10-10 ENCOUNTER — Emergency Department (HOSPITAL_COMMUNITY)
Admission: EM | Admit: 2018-10-10 | Discharge: 2018-10-10 | Disposition: A | Discharge status: Home / Self Care | Payer: Medicare HMO | Attending: Emergency Medicine | Admitting: Emergency Medicine

## 2018-10-10 ENCOUNTER — Emergency Department (HOSPITAL_COMMUNITY): Payer: Medicare HMO

## 2018-10-10 DIAGNOSIS — Z9049 Acquired absence of other specified parts of digestive tract: Secondary | ICD-10-CM | POA: Diagnosis not present

## 2018-10-10 DIAGNOSIS — Z79899 Other long term (current) drug therapy: Secondary | ICD-10-CM | POA: Diagnosis not present

## 2018-10-10 DIAGNOSIS — I4891 Unspecified atrial fibrillation: Secondary | ICD-10-CM | POA: Diagnosis not present

## 2018-10-10 DIAGNOSIS — I5032 Chronic diastolic (congestive) heart failure: Secondary | ICD-10-CM | POA: Diagnosis not present

## 2018-10-10 DIAGNOSIS — J069 Acute upper respiratory infection, unspecified: Secondary | ICD-10-CM

## 2018-10-10 DIAGNOSIS — B9789 Other viral agents as the cause of diseases classified elsewhere: Secondary | ICD-10-CM

## 2018-10-10 DIAGNOSIS — E039 Hypothyroidism, unspecified: Secondary | ICD-10-CM | POA: Diagnosis not present

## 2018-10-10 DIAGNOSIS — Z7901 Long term (current) use of anticoagulants: Secondary | ICD-10-CM | POA: Diagnosis not present

## 2018-10-10 DIAGNOSIS — Z85850 Personal history of malignant neoplasm of thyroid: Secondary | ICD-10-CM | POA: Diagnosis not present

## 2018-10-10 DIAGNOSIS — I110 Hypertensive heart disease with heart failure: Secondary | ICD-10-CM | POA: Diagnosis not present

## 2018-10-10 DIAGNOSIS — R05 Cough: Secondary | ICD-10-CM | POA: Diagnosis not present

## 2018-10-10 DIAGNOSIS — J3489 Other specified disorders of nose and nasal sinuses: Secondary | ICD-10-CM | POA: Diagnosis present

## 2018-10-10 LAB — COMPREHENSIVE METABOLIC PANEL
ALBUMIN: 3.5 g/dL (ref 3.5–5.0)
ALT: 23 U/L (ref 0–44)
AST: 48 U/L — AB (ref 15–41)
Alkaline Phosphatase: 102 U/L (ref 38–126)
Anion gap: 8 (ref 5–15)
BUN: 9 mg/dL (ref 8–23)
CO2: 27 mmol/L (ref 22–32)
Calcium: 8.3 mg/dL — ABNORMAL LOW (ref 8.9–10.3)
Chloride: 99 mmol/L (ref 98–111)
Creatinine, Ser: 0.65 mg/dL (ref 0.44–1.00)
GFR calc Af Amer: >60 mL/min (ref >60)
GFR calc non Af Amer: >60 mL/min (ref >60)
Glucose, Bld: 127 mg/dL — ABNORMAL HIGH (ref 70–99)
POTASSIUM: 4.4 mmol/L (ref 3.5–5.1)
Sodium: 134 mmol/L — ABNORMAL LOW (ref 135–145)
Total Bilirubin: 1 mg/dL (ref 0.3–1.2)
Total Protein: 6.7 g/dL (ref 6.5–8.1)

## 2018-10-10 LAB — URINALYSIS, ROUTINE W REFLEX MICROSCOPIC
BACTERIA UA: NONE SEEN
Bilirubin Urine: NEGATIVE
Glucose, UA: NEGATIVE mg/dL
Ketones, ur: NEGATIVE mg/dL
NITRITE: NEGATIVE
Protein, ur: NEGATIVE mg/dL
Specific Gravity, Urine: 1.009 (ref 1.005–1.030)
pH: 9 — ABNORMAL HIGH (ref 5.0–8.0)

## 2018-10-10 LAB — CBC WITH DIFFERENTIAL/PLATELET
Abs Immature Granulocytes: 0.03 10*3/uL (ref 0.00–0.07)
Basophils Absolute: 0 10*3/uL (ref 0.0–0.1)
Basophils Relative: 0 %
Eosinophils Absolute: 0 10*3/uL (ref 0.0–0.5)
Eosinophils Relative: 0 %
HCT: 38.4 % (ref 36.0–46.0)
Hemoglobin: 12.5 g/dL (ref 12.0–15.0)
Immature Granulocytes: 0 %
Lymphocytes Relative: 14 %
Lymphs Abs: 1.4 10*3/uL (ref 0.7–4.0)
MCH: 31.2 pg (ref 26.0–34.0)
MCHC: 32.6 g/dL (ref 30.0–36.0)
MCV: 95.8 fL (ref 80.0–100.0)
Monocytes Absolute: 0.9 10*3/uL (ref 0.1–1.0)
Monocytes Relative: 8 %
Neutro Abs: 7.9 10*3/uL — ABNORMAL HIGH (ref 1.7–7.7)
Neutrophils Relative %: 78 %
Platelets: 184 10*3/uL (ref 150–400)
RBC: 4.01 MIL/uL (ref 3.87–5.11)
RDW: 14.8 % (ref 11.5–15.5)
WBC: 10.2 10*3/uL (ref 4.0–10.5)
nRBC: 0 % (ref 0.0–0.2)

## 2018-10-10 LAB — INFLUENZA PANEL BY PCR (TYPE A & B)
Influenza A By PCR: NEGATIVE
Influenza B By PCR: NEGATIVE

## 2018-10-10 LAB — I-STAT TROPONIN, ED: Troponin i, poc: 0 ng/mL (ref 0.00–0.08)

## 2018-10-10 MED ORDER — GUAIFENESIN 100 MG/5ML PO LIQD: 100.0000 mg | ORAL | 0 refills | Status: DC | PRN

## 2018-10-10 NOTE — ED Provider Notes (Signed)
[facility] DEPT Provider Note   CSN: [id number] Arrival date & time: 10/10/18  1113    History   Chief Complaint Chief Complaint  Patient presents with  . Cough    HPI Kristen Whitehead is a 85 y.o. female with a PMH of CHF, Atrial Fibrillation on coumadin, GERD, and HTN presenting with a rhinorrhea, congestion, and productive cough for several days. Son is a contributing historian. Patient reports not taking anything for her symptoms. Patient describes sputum as thick and yellow. Patient denies sick contacts and reports she had her influenza vaccine. Patient reports chills, but denies fever, nausea, vomiting, or abdominal pain. Patient reports fatigue. Patient states she is eating and drinking without difficulty. Patient reports chronic urinary incontinece, but denies dysuria. Patient reports chronic brown diarrhea for years. Patient states she saw her PCP today and was advised to come to the ER. Patient lives by herself.     HPI  Past Medical History:  Diagnosis Date  . Bladder prolapse, female, acquired   . Cancer ([place])    thyroid  . Chest pain    no known ischemic heart disease; negative Myoview July 2013. EF 75% with no ischemia.   . Chronic anticoagulation   . Chronic atrial fibrillation    managed with rate control and coumadin  . Chronic diastolic heart failure ([place])   . GERD (gastroesophageal reflux disease)   . Heart disease   . History of congenital mitral regurgitation    mild  . History of mitral valve prolapse 06/15/2008   a. echo 1/14: mild LVH, EF 60-65%, mild MR, mild to mod BAE, PASP 35  . Hypercholesterolemia   . Hypertension   . Hypothyroidism     Patient Active Problem List   Diagnosis Date Noted  . Chronic anticoagulation 11/18/2016  . Weakness 11/10/2014  . Chest pain 11/10/2014  . Chronic diastolic heart failure ([place]) 02/01/2014  . Dyspnea 02/01/2014  . Encounter for therapeutic drug monitoring 09/16/2013  .  Chest pain at rest 03/12/2012  . Cough 09/16/2011  . Fatigue 04/08/2011  . Essential hypertension 02/11/2011  . Mitral regurgitation 02/11/2011  . Hypothyroidism 02/11/2011  . Hypercholesterolemia 02/11/2011  . Atrial fibrillation ([place]) 12/03/2010    Past Surgical History:  Procedure Laterality Date  . ABDOMINAL HYSTERECTOMY    . CATARACT EXTRACTION    . CHOLECYSTECTOMY    . Thyroidectomy       OB History   No obstetric history on file.      Home Medications    Prior to Admission medications   Medication Sig Start Date End Date Taking? Authorizing Provider  atenolol (TENORMIN) 25 MG tablet Take 1 tablet (25 mg total) by mouth 2 (two) times daily. 04/08/18  Yes [name], MD  benzonatate (TESSALON) 100 MG capsule Take 100 mg by mouth 3 (three) times daily as needed for cough. 10/09/18  Yes [provider]  Calcium Carbonate-Vitamin D (CALCIUM 600+D PO) Take 1 tablet by mouth 2 (two) times daily.   Yes [provider]  Cholecalciferol (VITAMIN D) 2000 UNITS tablet Take 4,000 Units by mouth daily.    Yes [provider]  cycloSPORINE (RESTASIS) 0.05 % ophthalmic emulsion Place 1 drop into both eyes 2 (two) times daily.   Yes [provider]  dorzolamide (TRUSOPT) 2 % ophthalmic solution Place 1 drop into the left eye 2 (two) times daily.   Yes [provider]  furosemide (LASIX) 40 MG tablet Take 1 tablet (40 mg total)  by mouth 2 (two) times daily. Patient taking differently: Take 20 mg by mouth 2 (two) times daily.  09/21/18  Yes Lendon Colonel, NP  isosorbide mononitrate (IMDUR) 30 MG 24 hr tablet Take 30 mg by mouth daily.   Yes [provider]  latanoprost (XALATAN) 0.005 % ophthalmic solution Place 1 drop into both eyes at bedtime.  02/23/13  Yes [provider]  levothyroxine (SYNTHROID, LEVOTHROID) 100 MCG tablet Take 100 mcg by mouth daily.  04/20/15  Yes [provider]  MAGNESIUM OXIDE 400 PO  Take 800 mg by mouth daily.   Yes [provider]  Multiple Vitamins-Minerals (CENTRUM SILVER PO) Take 1 tablet by mouth daily.   Yes [provider]  Multiple Vitamins-Minerals (PRESERVISION AREDS 2 PO) Take 1 capsule by mouth 2 (two) times daily.    Yes [provider]  potassium chloride (K-DUR,KLOR-CON) 10 MEQ tablet TAKE 2 TABLETS EVERY DAY Patient taking differently: Take 10 mEq by mouth daily.  08/24/18  Yes [name], MD  pramipexole (MIRAPEX) 0.125 MG tablet Take 1 tablet (0.125 mg total) by mouth every evening. 08/28/18  Yes Star Age, MD  PREMARIN vaginal cream Place 2 g vaginally 2 (two) times a week. Uses on Sunday and wednesday 03/25/18  Yes [provider]  Probiotic Product (PROBIOTIC DAILY PO) Take 1 tablet by mouth daily.   Yes [provider]  temazepam (RESTORIL) 15 MG capsule Take 15 mg by mouth at bedtime.   Yes [provider]  warfarin (COUMADIN) 5 MG tablet TAKE 1/2 TO 1 TABLET DAILY AS DIRECTED BY COUMADIN CLINIC Patient taking differently: Take 5 mg by mouth as directed. Take 2.5mg  mondays, wednesdays and fridays 09/14/18  Yes [name], MD  guaiFENesin (ROBITUSSIN) 100 MG/5ML liquid Take 5-10 mLs (100-200 mg total) by mouth every 4 (four) hours as needed for cough. 10/10/18   Darlin Drop P, PA-C  nitroGLYCERIN (NITROSTAT) 0.4 MG SL tablet DISSOLVE 1 TABLET UNDER THE TONGUE EVERY 5 MINUTES FOR 3 DOSES AS NEEDED  FOR  CHEST  PAIN Patient not taking: Reported on 10/10/2018 02/13/16   [name], MD    Family History Family History  Problem Relation Age of Onset  . Stroke Mother   . Stroke Father   . Heart attack Neg Hx   . Heart disease Neg Hx     Social History Social History   Tobacco Use  . Smoking status: Never Smoker  . Smokeless tobacco: Never Used  Substance Use Topics  . Alcohol use: No  . Drug use: No     Allergies   Azithromycin; Compazine [prochlorperazine edisylate];  Demerol; Dolophine [methadone]; Ebastine; Erythromycin; Ibuprofen; Statins; Tetanus toxoids; and Tetracyclines & related   Review of Systems Review of Systems  Constitutional: Positive for chills and fatigue. Negative for activity change, appetite change and fever.  HENT: Positive for congestion, rhinorrhea and sore throat. Negative for ear pain and postnasal drip.   Eyes: Negative for pain, redness and itching.  Respiratory: Positive for cough and shortness of breath.   Cardiovascular: Negative for chest pain.  Gastrointestinal: Positive for diarrhea. Negative for abdominal pain, nausea and vomiting.  Genitourinary: Negative for dysuria, flank pain and frequency.  Musculoskeletal: Negative for myalgias.  Skin: Negative for rash.  Allergic/Immunologic: Negative for environmental allergies.  Neurological: Negative for dizziness, weakness and headaches.    Physical Exam Updated Vital Signs BP 130/68 (BP Location: Left Arm)   Pulse 83   Temp 99 F (37.2 C) (Oral)  Resp 18   SpO2 94%   Physical Exam Vitals signs and nursing note reviewed.  Constitutional:      General: She is not in acute distress.    Appearance: She is well-developed. She is not diaphoretic.  HENT:     Head: Normocephalic and atraumatic.     Right Ear: Tympanic membrane, ear canal and external ear normal. No middle ear effusion.     Left Ear: Tympanic membrane, ear canal and external ear normal.  No middle ear effusion.     Nose: Mucosal edema, congestion and rhinorrhea present.     Mouth/Throat:     Mouth: Mucous membranes are moist.     Pharynx: Uvula midline. Posterior oropharyngeal erythema present. No oropharyngeal exudate.  Eyes:     General:        Right eye: No discharge.        Left eye: No discharge.     Conjunctiva/sclera: Conjunctivae normal.  Neck:     Musculoskeletal: Normal range of motion and neck supple.  Cardiovascular:     Rate and Rhythm: Normal rate. Rhythm irregular.     Heart  sounds: Normal heart sounds. No murmur. No friction rub. No gallop.   Pulmonary:     Effort: Pulmonary effort is normal. No respiratory distress.     Breath sounds: Normal breath sounds. No wheezing or rales.  Abdominal:     Palpations: Abdomen is soft.     Tenderness: There is no abdominal tenderness.  Musculoskeletal: Normal range of motion.  Lymphadenopathy:     Cervical: No cervical adenopathy.  Skin:    General: Skin is warm.     Findings: No rash.  Neurological:     Mental Status: She is alert and oriented to person, place, and time.     ED Treatments / Results  Labs (all labs ordered are listed, but only abnormal results are displayed) Labs Reviewed  COMPREHENSIVE METABOLIC PANEL - Abnormal; Notable for the following components:      Result Value   Sodium 134 (*)    Glucose, Bld 127 (*)    Calcium 8.3 (*)    AST 48 (*)    All other components within normal limits  CBC WITH DIFFERENTIAL/PLATELET - Abnormal; Notable for the following components:   Neutro Abs 7.9 (*)    All other components within normal limits  URINALYSIS, ROUTINE W REFLEX MICROSCOPIC - Abnormal; Notable for the following components:   pH 9.0 (*)    Hgb urine dipstick SMALL (*)    Leukocytes,Ua LARGE (*)    All other components within normal limits  URINE CULTURE  INFLUENZA PANEL BY PCR (TYPE A & B)  I-STAT TROPONIN, ED    EKG EKG Interpretation  Date/Time:  Saturday October 10 2018 13:43:34 EST Ventricular Rate:  67 PR Interval:    QRS Duration: 74 QT Interval:  417 QTC Calculation: 441 R Axis:   47 Text Interpretation:  Atrial fibrillation No significant change was found Confirmed by Jola Schmidt [phone]) on 10/10/2018 3:03:57 PM    Radiology Dg Chest 2 View  Result Date: 10/10/2018 CLINICAL DATA:  Productive cough for 24 hours. EXAM: CHEST - 2 VIEW COMPARISON:  10/25/2017 FINDINGS: Mildly enlarged cardiac silhouette. Calcific atherosclerotic disease and tortuosity of the aorta.  Mediastinal contours appear intact. There is no evidence of focal airspace consolidation, pleural effusion or pneumothorax. Mild exaggerated thoracic kyphosis with likely degenerative chronic loss of height of multiple vertebral bodies. Soft tissues are grossly normal. IMPRESSION: 1.  Mildly enlarged cardiac silhouette. 2. Calcific atherosclerotic disease of the aorta. 3. No evidence of lobar consolidation. Electronically Signed   By: Fidela Salisbury M.D.   On: 10/10/2018 13:04    Procedures Procedures (including critical care time)  Medications Ordered in ED Medications - No data to display   Initial Impression / Assessment and Plan / ED Course  I have reviewed the triage vital signs and the nursing notes.  Pertinent labs & imaging results that were available during my care of the patient were reviewed by me and considered in my medical decision making (see chart for details).  Clinical Course as of Oct 10 1624  Sat Oct 10, 2018  1316 Large leukocytes, WBCs, and hgb noted on UA. Will order urine culture.  Leukocytes,Ua(!): LARGE [AH]  1336 Mildly enlarged cardiac silhouette. Calcific atherosclerotic disease of the aorta. No evidence of lobar consolidation.    DG Chest 2 View [AH]    Clinical Course User Index [AH] Darlin Drop P, PA-C      Pt CXR negative for acute infiltrate. Patients symptoms are consistent with URI, likely viral etiology. Discussed that antibiotics are not indicated for viral infections. Flu test is negative. Pt will be discharged with symptomatic treatment.  Verbalizes understanding and is agreeable with plan. Pt is hemodynamically stable & in NAD prior to dc.  Final Clinical Impressions(s) / ED Diagnoses   Final diagnoses:  Viral URI with cough    ED Discharge Orders         Ordered    guaiFENesin (ROBITUSSIN) 100 MG/5ML liquid  Every 4 hours PRN     10/10/18 1615           Arville Lime, [place] 10/10/18 [place], Kevin,  MD 10/10/18 1658

## 2018-10-10 NOTE — Discharge Instructions (Addendum)
You have been seen today for cough. Please read and follow all provided instructions.   1. Medications: Guaifenesin for cough, usual home medications 2. Treatment: rest, drink plenty of fluids 3. Follow Up: Please follow up with your primary doctor in 2 days for discussion of your diagnoses and further evaluation after today's visit; if you do not have a primary care doctor use the resource guide provided to find one; Please return to the ER for any new or worsening symptoms. Please obtain all of your results from medical records or have your doctors office obtain the results - share them with your doctor - you should be seen at your doctors office. Call today to arrange your follow up.   Take medications as prescribed. Please review all of the medicines and only take them if you do not have an allergy to them. Return to the emergency room for worsening condition or new concerning symptoms. Follow up with your regular doctor. If you don't have a regular doctor use one of the numbers below to establish a primary care doctor.  Please be aware that if you are taking birth control pills, taking other prescriptions, ESPECIALLY ANTIBIOTICS may make the birth control ineffective - if this is the case, either do not engage in sexual activity or use alternative methods of birth control such as condoms until you have finished the medicine and your family doctor says it is OK to restart them. If you are on a blood thinner such as COUMADIN, be aware that any other medicine that you take may cause the coumadin to either work too much, or not enough - you should have your coumadin level rechecked in next 7 days if this is the case.  ?  It is also a possibility that you have an allergic reaction to any of the medicines that you have been prescribed - Everybody reacts differently to medications and while MOST people have no trouble with most medicines, you may have a reaction such as nausea, vomiting, rash, swelling,  shortness of breath. If this is the case, please stop taking the medicine immediately and contact your physician.  ?  You should return to the ER if you develop severe or worsening symptoms.   Emergency Department Resource Guide 1) Find a Doctor and Pay Out of Pocket Although you won't have to find out who is covered by your insurance plan, it is a good idea to ask around and get recommendations. You will then need to call the office and see if the doctor you have chosen will accept you as a new patient and what types of options they offer for patients who are self-pay. Some doctors offer discounts or will set up payment plans for their patients who do not have insurance, but you will need to ask so you aren't surprised when you get to your appointment.  2) Contact Your Local Health Department Not all health departments have doctors that can see patients for sick visits, but many do, so it is worth a call to see if yours does. If you don't know where your local health department is, you can check in your phone book. The CDC also has a tool to help you locate your state's health department, and many state websites also have listings of all of their local health departments.  3) Find a [facility] If your illness is not likely to be very severe or complicated, you may want to try a walk in clinic. These are popping up  all over the country in pharmacies, drugstores, and shopping centers. They're usually staffed by nurse practitioners or physician assistants that have been trained to treat common illnesses and complaints. They're usually fairly quick and inexpensive. However, if you have serious medical issues or chronic medical problems, these are probably not your best option.  No Primary Care Doctor: Call Health Connect at  [phone] - they can help you locate a primary care doctor that  accepts your insurance, provides certain services, etc. Physician Referral [phone]  Emergency  Department Resource Guide 1) Find a Doctor and Pay Out of Pocket Although you won't have to find out who is covered by your insurance plan, it is a good idea to ask around and get recommendations. You will then need to call the office and see if the doctor you have chosen will accept you as a new patient and what types of options they offer for patients who are self-pay. Some doctors offer discounts or will set up payment plans for their patients who do not have insurance, but you will need to ask so you aren't surprised when you get to your appointment.  2) Contact Your Local Health Department Not all health departments have doctors that can see patients for sick visits, but many do, so it is worth a call to see if yours does. If you don't know where your local health department is, you can check in your phone book. The CDC also has a tool to help you locate your state's health department, and many state websites also have listings of all of their local health departments.  3) Find a [facility] If your illness is not likely to be very severe or complicated, you may want to try a walk in clinic. These are popping up all over the country in pharmacies, drugstores, and shopping centers. They're usually staffed by nurse practitioners or physician assistants that have been trained to treat common illnesses and complaints. They're usually fairly quick and inexpensive. However, if you have serious medical issues or chronic medical problems, these are probably not your best option.  No Primary Care Doctor: Call Health Connect at  [phone] - they can help you locate a primary care doctor that  accepts your insurance, provides certain services, etc. Physician Referral Service- [phone]  Chronic Pain Problems: Organization         Address  Phone   Notes  [facility]  [phone] Patients need to be referred by their primary care doctor.   Medication  Assistance: Organization         Address  Phone   Notes  [facility] Medication [facility] [address] [place], [place] [address] [phone] --Must be a resident of [facility] -- Must have NO insurance coverage whatsoever (no Medicaid/ Medicare, etc.) -- The pt. MUST have a primary care doctor that directs their care regularly and follows them in the community   [facility]  [phone]   [employer]  [phone]    Agencies that provide inexpensive medical care: Organization         Address  Phone   Notes  [place]  [phone]   [facility]    [phone]   [facility] [place], [place] [address] [phone]   [place] [address], [place] [phone]   [facility]anned Parenthood    [phone]  [facility]    [phone]   [facility]  [address], [place] Phone:  [phone], Fax:  [phone] Hours of Operation:  9 am - 6 pm, M-F.  Also accepts Medicaid/Medicare and self-pay.  [facility] for [address], [address], [place] Phone: [phone], Fax: [phone]. Hours of Operation:  8:30 am - 5:30 pm, M-F.  Also accepts Medicaid and self-pay.  [facility] [place] [address], [place] Phone: [phone]   [place], [place], [place] [phone], [address]. [phone] Mondays & Thursdays: 7-9 AM.  First 15 patients are seen on a first come, first serve basis.    [place] Providers:  Organization         Address  Phone   Notes  [facility] [address], [place] [phone] Also accepts self-pay patients.  [facility] [address], [place]  [phone]   [place], [address], [place]  [phone]   [facility] [address] [place] [phone]   Lucianne Lei [address], [place]   [phone] Only accepts [place] patients after they have their name applied to their card.   Self-Pay (no insurance) in [facility]:  Organization         Address  Phone   Notes  Sickle Cell Patients, [facility] [phone]   [facility] [place] [phone]   [facility] [place]  [place], [place], [place] [phone]   [facility]/Dr. Osei-Bonsu  [address] [place] or [address], [place] [phone] Phone number for both [place] and [place] locations is the same.  [facility]gent [facility] and [facility] [address], [place] [phone]   [facility] [address], [place] or [address] [phone]   [facility] [address], [place] [phone], phone; [phone], fax Sees patients 1st and 3rd Saturday of every month.  Must not qualify for public or private insurance (i.e. Medicaid, Medicare, [place] Health Choice, Veterans' Benefits)  Household income should be no more than 200% of the poverty level The clinic cannot treat you if you are pregnant or think you are pregnant  Sexually transmitted diseases are not treated at the clinic.

## 2018-10-10 NOTE — ED Triage Notes (Signed)
Patient here from home with complaints of productive cough x24 hours. Thick yellow mucous. Fatigue.

## 2018-10-10 NOTE — ED Notes (Signed)
Patient transported to X-ray 

## 2018-10-13 ENCOUNTER — Other Ambulatory Visit: Payer: Self-pay

## 2018-10-13 DIAGNOSIS — E871 Hypo-osmolality and hyponatremia: Secondary | ICD-10-CM | POA: Diagnosis not present

## 2018-10-13 DIAGNOSIS — J069 Acute upper respiratory infection, unspecified: Secondary | ICD-10-CM | POA: Diagnosis not present

## 2018-10-13 DIAGNOSIS — R8290 Unspecified abnormal findings in urine: Secondary | ICD-10-CM | POA: Diagnosis not present

## 2018-10-13 NOTE — Patient Outreach (Signed)
[place] [facility]) Care Management  10/13/2018  Kristen Whitehead 1923/05/08 [id number]    !st unsuccessful outreach to the patient for assessment.  No answer.  HIPAA compliant voicemail left with contact information.  Plan: RN Health Coach will send letter. [facility] will make outreach attempt the patient within month of March.   Lazaro Arms RN, BSN, [place] Direct Dial:  [phone]  Fax: [phone]

## 2018-10-14 ENCOUNTER — Ambulatory Visit (INDEPENDENT_AMBULATORY_CARE_PROVIDER_SITE_OTHER): Payer: Medicare HMO | Admitting: Pharmacist Clinician (PhC)/ Clinical Pharmacy Specialist

## 2018-10-14 DIAGNOSIS — I482 Chronic atrial fibrillation, unspecified: Secondary | ICD-10-CM

## 2018-10-14 DIAGNOSIS — Z5181 Encounter for therapeutic drug level monitoring: Secondary | ICD-10-CM

## 2018-10-14 DIAGNOSIS — I4891 Unspecified atrial fibrillation: Secondary | ICD-10-CM

## 2018-10-14 LAB — POCT INR: INR: 1.7 — AB (ref 2.0–3.0)

## 2018-10-14 NOTE — Patient Instructions (Signed)
Take 1 tablet today Wednesday Feb 26, then continue with 5mg  daily except for 2.5 mg tablet every Monday, Wednesday and Friday. Recheck INR in 3 weeks.

## 2018-10-21 ENCOUNTER — Encounter (INDEPENDENT_AMBULATORY_CARE_PROVIDER_SITE_OTHER): Payer: Medicare HMO | Admitting: Ophthalmology

## 2018-10-21 DIAGNOSIS — H43813 Vitreous degeneration, bilateral: Secondary | ICD-10-CM | POA: Diagnosis not present

## 2018-10-21 DIAGNOSIS — H353132 Nonexudative age-related macular degeneration, bilateral, intermediate dry stage: Secondary | ICD-10-CM

## 2018-10-21 DIAGNOSIS — H2703 Aphakia, bilateral: Secondary | ICD-10-CM

## 2018-10-21 DIAGNOSIS — H35342 Macular cyst, hole, or pseudohole, left eye: Secondary | ICD-10-CM

## 2018-10-21 DIAGNOSIS — H3562 Retinal hemorrhage, left eye: Secondary | ICD-10-CM

## 2018-10-21 DIAGNOSIS — I10 Essential (primary) hypertension: Secondary | ICD-10-CM

## 2018-10-21 DIAGNOSIS — H35033 Hypertensive retinopathy, bilateral: Secondary | ICD-10-CM | POA: Diagnosis not present

## 2018-10-23 ENCOUNTER — Telehealth: Payer: Self-pay | Admitting: Cardiovascular Disease

## 2018-10-23 ENCOUNTER — Ambulatory Visit: Payer: Self-pay | Admitting: Pharmacist Clinician (PhC)/ Clinical Pharmacy Specialist

## 2018-10-23 DIAGNOSIS — I4891 Unspecified atrial fibrillation: Secondary | ICD-10-CM

## 2018-10-23 DIAGNOSIS — Z5181 Encounter for therapeutic drug level monitoring: Secondary | ICD-10-CM

## 2018-10-23 NOTE — Telephone Encounter (Signed)
New Message    Pt would like the nurse to call back because she has been working with a pharmacist to get her BP down.  Pt says Dr Zigmund Daniel doesn't want her INR to get higher then 2.5  She needs to talk to someone about her medication Warfarin  Please call back

## 2018-10-26 ENCOUNTER — Other Ambulatory Visit: Payer: Self-pay

## 2018-10-26 ENCOUNTER — Ambulatory Visit: Payer: Self-pay

## 2018-10-26 NOTE — Telephone Encounter (Signed)
Patient spoke with [facility] PharmD today and reported that home BP readings were as high as 370 systolic.  She notes this has happened for 3-4 days, but is not terribly worried, as it always seems to go back down.  We have had concern for adding new medication, as she is almost always WNL in the office, and at her age, I worry about causing hypotension.     Asked her to continue monitoring, and if continues to be around 488 systolic by the end of the week she should call.  Patient is aware of symptoms that would be concern for calling 911.

## 2018-10-26 NOTE — Patient Outreach (Signed)
[place] [facility]) Care Management  10/26/2018  LELAR FAREWELL [birth year] [id number]  Successful outreach to Ms. Hettich.  HIPAA identifiers verified.  Ms. Bottomley reports that her systolic BPs have consistently been >200 for the last 5 days.  She states she is going to outreach to her cardiology pharmacist, Tommy Medal to let her know.  She requested that I call her tomorrow afternoon.  Plan: Outreach to Ms. Krisher tomorrow afternoon.  Joetta Manners, PharmD Clinical Pharmacist [place] [phone]

## 2018-10-27 ENCOUNTER — Other Ambulatory Visit: Payer: Self-pay

## 2018-10-27 ENCOUNTER — Ambulatory Visit: Payer: Self-pay

## 2018-10-27 NOTE — Patient Outreach (Addendum)
[facility]) Care Management  10/27/2018   Kristen Whitehead 20-Aug-1922 [id number]  Subjective: Successful outreach to the patient .  HIPAA verified.   The patient had just woke up.  She states that she is trying to go to bed early so that she will be able to get up earlier. She states that she feels fine this morning.  She states that she takes her temazepam and it helps her to sleep. She only woke up one time last night to go to the restroom.  She had her medication delivered to her Saturday and received a new bottle of temazepam and as she was throwing out the empty bottles she thinks she threw out the new bottle and can't find it anywhere.  She states that she has never done that before. She is going to call her physician office to see if she can get a refill because she needs the medication. Advised the patient to look in the trash and around her home before calling. She verbalized understanding. The patient had not checked her weight or blood pressure yet this morning. Encouraged the patient to weigh and check her blood pressure. She verbalized understanding.  She states that her blood pressure has been running in the 086'V systolic and she notified her physician and they would like her to check her blood pressure daily and if it continues to be in the 200's to call the office back this week.  She states that she is taking her medications as prescribed.  She denies any chest pain shortness of breath , swelling, weakness, headaches, dizziness or falls.  Went over signs and symptoms of elevated blood pressure, CHF and action plan.  She verbalized understanding.    Current Medications:  Current Outpatient Medications  Medication Sig Dispense Refill  . atenolol (TENORMIN) 25 MG tablet Take 1 tablet (25 mg total) by mouth 2 (two) times daily. 180 tablet 3  . benzonatate (TESSALON) 100 MG capsule Take 100 mg by mouth 3 (three) times daily as needed for cough.    . Calcium  Carbonate-Vitamin D (CALCIUM 600+D PO) Take 1 tablet by mouth 2 (two) times daily.    . Cholecalciferol (VITAMIN D) 2000 UNITS tablet Take 4,000 Units by mouth daily.     . cycloSPORINE (RESTASIS) 0.05 % ophthalmic emulsion Place 1 drop into both eyes 2 (two) times daily.    . dorzolamide (TRUSOPT) 2 % ophthalmic solution Place 1 drop into the left eye 2 (two) times daily.    . furosemide (LASIX) 40 MG tablet Take 1 tablet (40 mg total) by mouth 2 (two) times daily. (Patient taking differently: Take 20 mg by mouth 2 (two) times daily. ) 90 tablet 1  . guaiFENesin (ROBITUSSIN) 100 MG/5ML liquid Take 5-10 mLs (100-200 mg total) by mouth every 4 (four) hours as needed for cough. 60 mL 0  . isosorbide mononitrate (IMDUR) 30 MG 24 hr tablet Take 30 mg by mouth daily.    Marland Kitchen latanoprost (XALATAN) 0.005 % ophthalmic solution Place 1 drop into both eyes at bedtime.     Marland Kitchen levothyroxine (SYNTHROID, LEVOTHROID) 100 MCG tablet Take 100 mcg by mouth daily.     Marland Kitchen MAGNESIUM OXIDE 400 PO Take 800 mg by mouth daily.    . Multiple Vitamins-Minerals (CENTRUM SILVER PO) Take 1 tablet by mouth daily.    . Multiple Vitamins-Minerals (PRESERVISION AREDS 2 PO) Take 1 capsule by mouth 2 (two) times daily.     . nitroGLYCERIN (NITROSTAT) 0.4 MG  SL tablet DISSOLVE 1 TABLET UNDER THE TONGUE EVERY 5 MINUTES FOR 3 DOSES AS NEEDED  FOR  CHEST  PAIN 50 tablet 2  . potassium chloride (K-DUR,KLOR-CON) 10 MEQ tablet TAKE 2 TABLETS EVERY DAY (Patient taking differently: Take 10 mEq by mouth daily. ) 180 tablet 2  . pramipexole (MIRAPEX) 0.125 MG tablet Take 1 tablet (0.125 mg total) by mouth every evening. 30 tablet 5  . PREMARIN vaginal cream Place 2 g vaginally 2 (two) times a week. Uses on Sunday and wednesday    . Probiotic Product (PROBIOTIC DAILY PO) Take 1 tablet by mouth daily.    . temazepam (RESTORIL) 15 MG capsule Take 15 mg by mouth at bedtime.    Marland Kitchen warfarin (COUMADIN) 5 MG tablet TAKE 1/2 TO 1 TABLET DAILY AS DIRECTED BY  COUMADIN CLINIC (Patient taking differently: Take 2.5-5 mg by mouth See admin instructions. Take 5mg  daily except take 2.5mg  on mondays, wednesdays and fridays) 90 tablet 0   No current facility-administered medications for this visit.     Functional Status:  In your present state of health, do you have any difficulty performing the following activities: 10/01/2018 04/02/2018  Hearing? N N  Vision? N Y  Difficulty concentrating or making decisions? N N  Walking or climbing stairs? Y Y  Comment - -  Dressing or bathing? N N  Doing errands, shopping? Y N  Preparing Food and eating ? N -  Using the Toilet? N -  In the past six months, have you accidently leaked urine? N -  Do you have problems with loss of bowel control? N -  Managing your Medications? Y -  Managing your Finances? N -  Housekeeping or managing your Housekeeping? Y -  Some recent data might be hidden    Fall/Depression Screening: Fall Risk  10/27/2018 10/01/2018 09/09/2018  Falls in the past year? 0 0 0  Number falls in past yr: - 0 -  Injury with Fall? - 0 -  Risk for fall due to : - Impaired balance/gait;Impaired mobility -  Follow up - Education provided;Falls prevention discussed -   PHQ 2/9 Scores 10/01/2018 04/02/2018 01/08/2018 12/16/2017  PHQ - 2 Score 0 0 0 0    Assessment: Patient will continue to benefit from health coach outreach for disease management and support. THN CM Care Plan Problem One     Most Recent Value  THN Long Term Goal   In 60 days the patient will verbalize no admission due to chf exacerbation  THN Long Term Goal Start Date  10/27/18  Interventions for Problem One Long Term Goal  Encouraged the patient to continue to monitor her weight and blood pressure,  notify her physician if her bp continues to be elevated, dicussed signs and symptoms for high blood pressure and chf,went over the action plan for chf and encouraged medication adhedrence       Plan: [facility] will contact patient  in the month of May and patient agrees to next outreach.   Lazaro Arms RN, BSN, [place] Direct Dial:  [phone]  Fax: [phone]

## 2018-10-27 NOTE — Patient Outreach (Signed)
[place] [facility]) Care Management  10/27/2018  AUNDREA HORACE 24-Jan-1923 [id number]  Successful outreach attempt to Ms. Ransdell.  HIPAA identifiers verified.  Ms. Timberman reports that she spoke with the pharmacist at her cardiology's office yesterday.  She reports that she is supposed to call at the end of this week if her systolics continue to be elevated (>200).  She states that her morning blood pressure was 173/93 today. Patient states that she normally takes 25 mg atenolol twice daily, but was told if her pressures were high that she could take an extra dose.  She reports that she took a fourth dose on Sunday because her bedtime blood pressure was 200/108 and "I didn't know what else to do."   She states that she is trying to take her medications on a more regular schedule by setting an alarm to remind her, so she doesn't go long intervals between her doses.  She reports that she is a "night owl" and sleeps late in the morning.  Of note, patient reports that she is taking furosemide 20 mg daily.  Her chart states that furosemide is twice daily.  Unsure is she was verbally told to decrease her dose to daily or if she is confused.  Ms. Fincher has an appointment on 3/18 with cardiology.    Plan: Route note to cardiology pharmacist to ensure that correct furosemide dosage is addressed at next week's visit.   Route discipline closure letter to PCP, Dr. Drema Dallas.   Joetta Manners, PharmD Clinical Pharmacist [place] [phone]

## 2018-10-30 ENCOUNTER — Telehealth: Payer: Self-pay | Admitting: Cardiovascular Disease

## 2018-10-30 MED ORDER — ATENOLOL 50 MG PO TABS: 50.0000 mg | ORAL_TABLET | Freq: Two times a day (BID) | ORAL | 3 refills | Status: DC

## 2018-10-30 NOTE — Telephone Encounter (Signed)
New Message   Pt c/o BP issue:  1. What are your last 5 BP readings? 228/105 2. Are you having any other symptoms (ex. Dizziness, headache, blurred vision, passed out)? no   3. What is your medication issue? None   Patient is because her BP is high. She was advised by the pharmacist to call if it continues to be high.

## 2018-10-30 NOTE — Telephone Encounter (Signed)
Spoke with patient and she stated that her blood pressure has been going up since last week. She did recheck her blood pressure after the 228/105 and its was 198/112. She did sit and pray about it. She eventually rechecked with her old and new machine and readings had improved to 163/80 and 172/89. Will forward to Alena Bills D for review.

## 2018-10-30 NOTE — Telephone Encounter (Signed)
Has been taking atenolol up to 3 times per day.  On two days she ended up taking 4 tablets.  Most days her BP drops after a mid-day peak, but this week has been going up more regularly.   Advised that she increase the atenolol to 50 mg twice daily.  She has INR check on Wednesday, will review with her at that time.

## 2018-11-04 ENCOUNTER — Other Ambulatory Visit: Payer: Self-pay

## 2018-11-04 ENCOUNTER — Ambulatory Visit (INDEPENDENT_AMBULATORY_CARE_PROVIDER_SITE_OTHER): Payer: Medicare HMO | Admitting: Pharmacist Clinician (PhC)/ Clinical Pharmacy Specialist

## 2018-11-04 DIAGNOSIS — I482 Chronic atrial fibrillation, unspecified: Secondary | ICD-10-CM | POA: Diagnosis not present

## 2018-11-04 DIAGNOSIS — Z5181 Encounter for therapeutic drug level monitoring: Secondary | ICD-10-CM

## 2018-11-04 LAB — POCT INR: INR: 1.3 — AB (ref 2.0–3.0)

## 2018-11-04 IMAGING — MG 2D DIGITAL SCREENING BILATERAL MAMMOGRAM WITH CAD AND ADJUNCT TO
9 of 13 series · 9 of 29 positions shown · non-contrast
Comparison: Previous exam(s).

CLINICAL DATA: Screening.

EXAM:
2D DIGITAL SCREENING BILATERAL MAMMOGRAM WITH CAD AND ADJUNCT TOMO

[L MLO (1 of 2)]
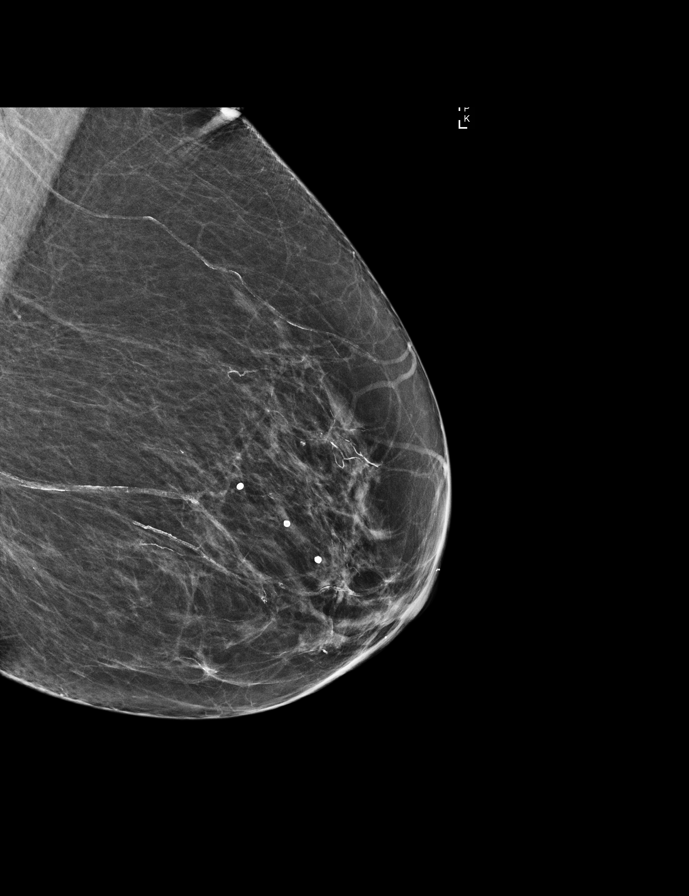

[L CC synth-2D]
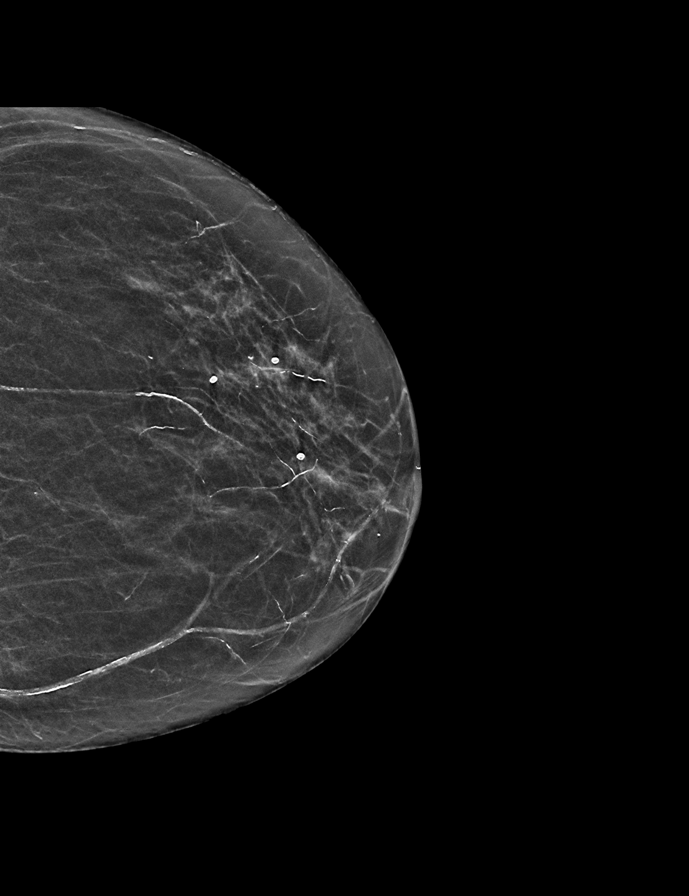

[R MLO synth-2D]
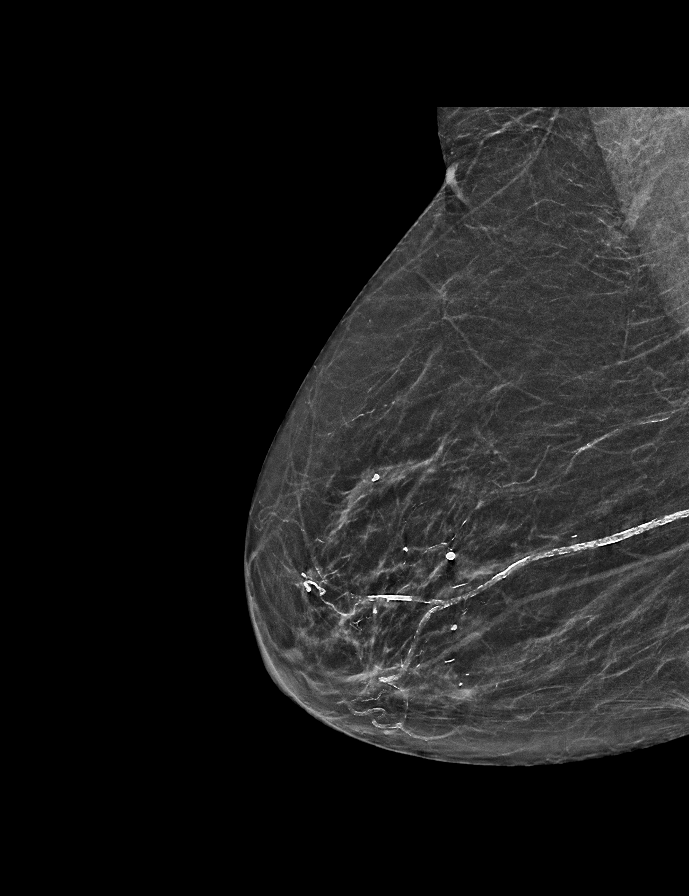

[R CC]
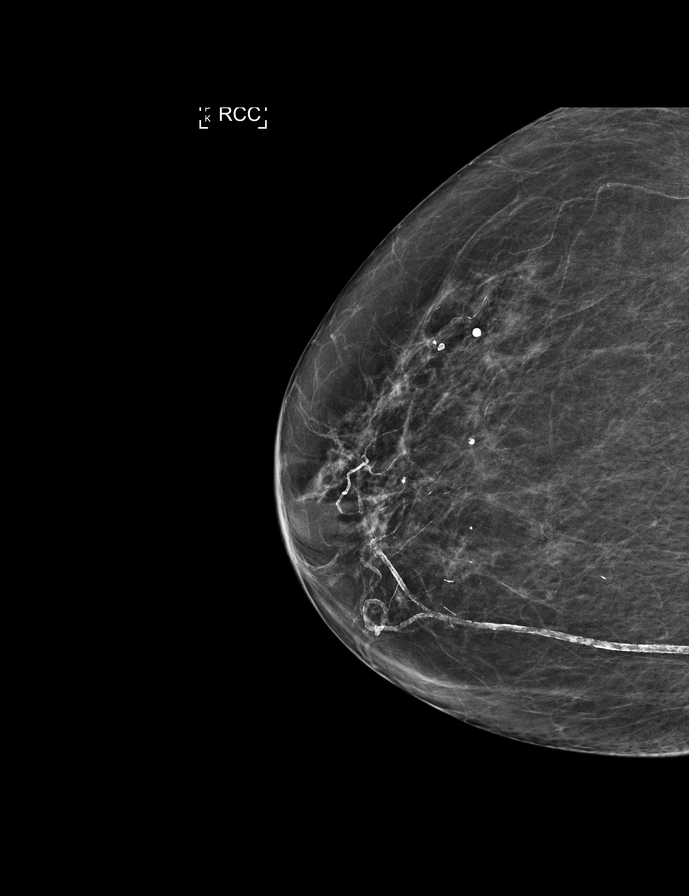

[L MLO (2 of 2)]
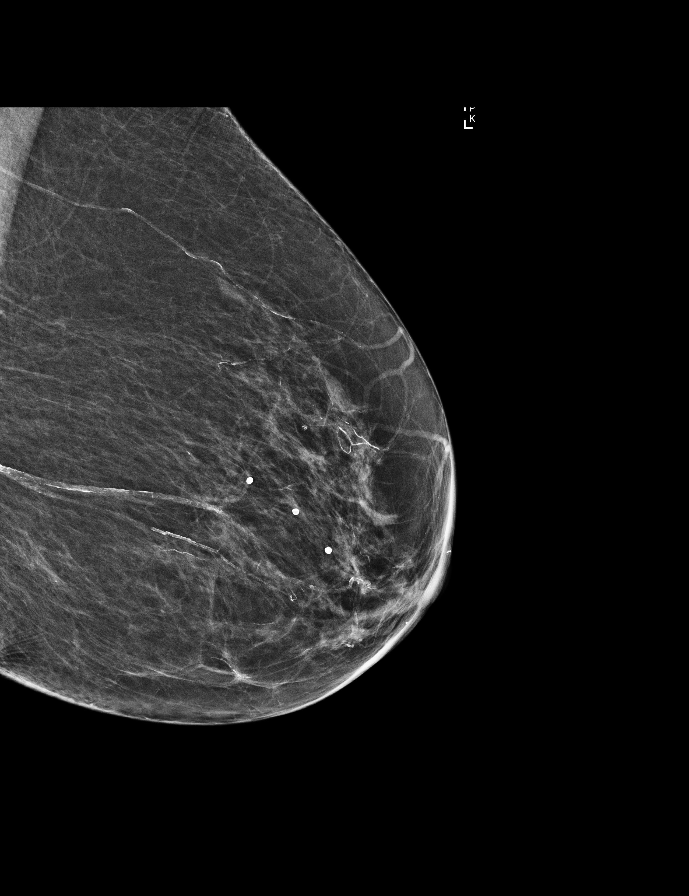

[L CC]
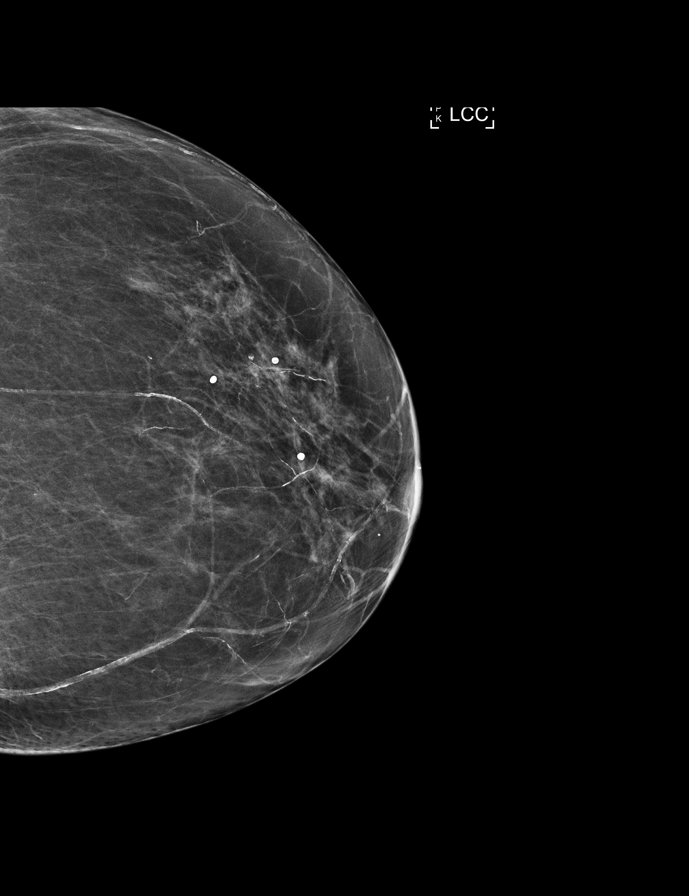

[R MLO]
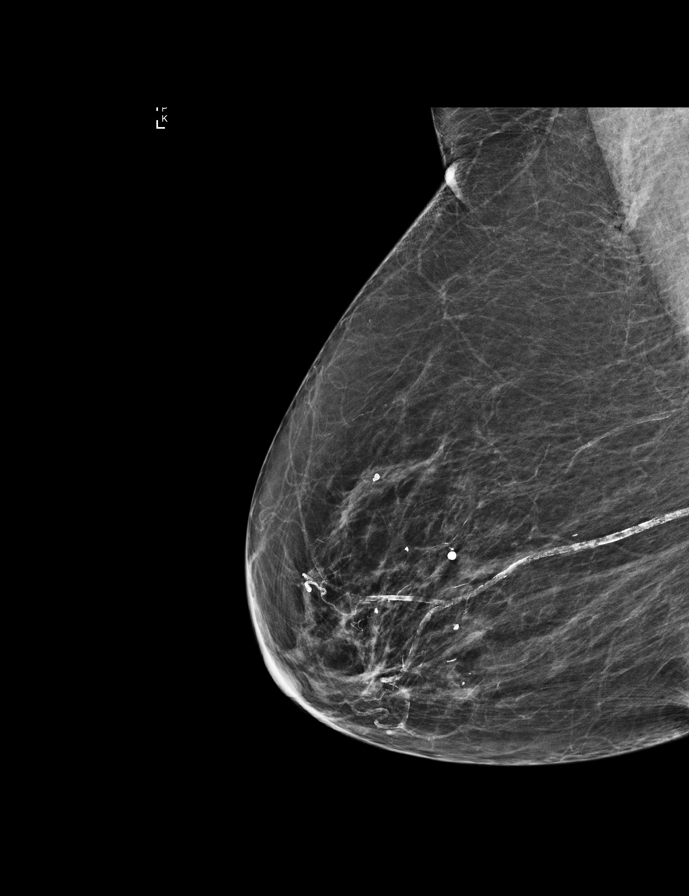

[L MLO synth-2D]
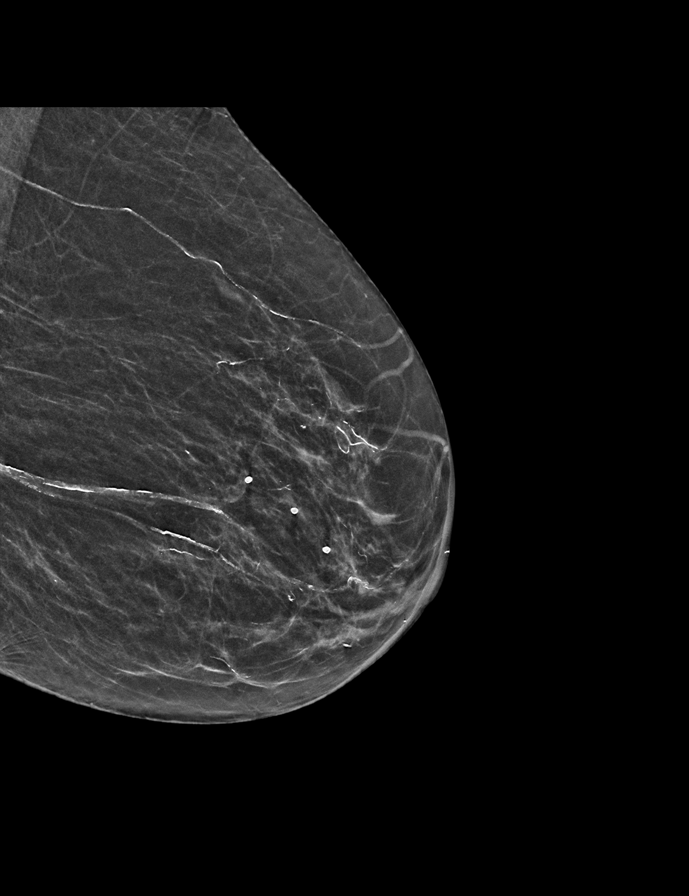

[R CC synth-2D]
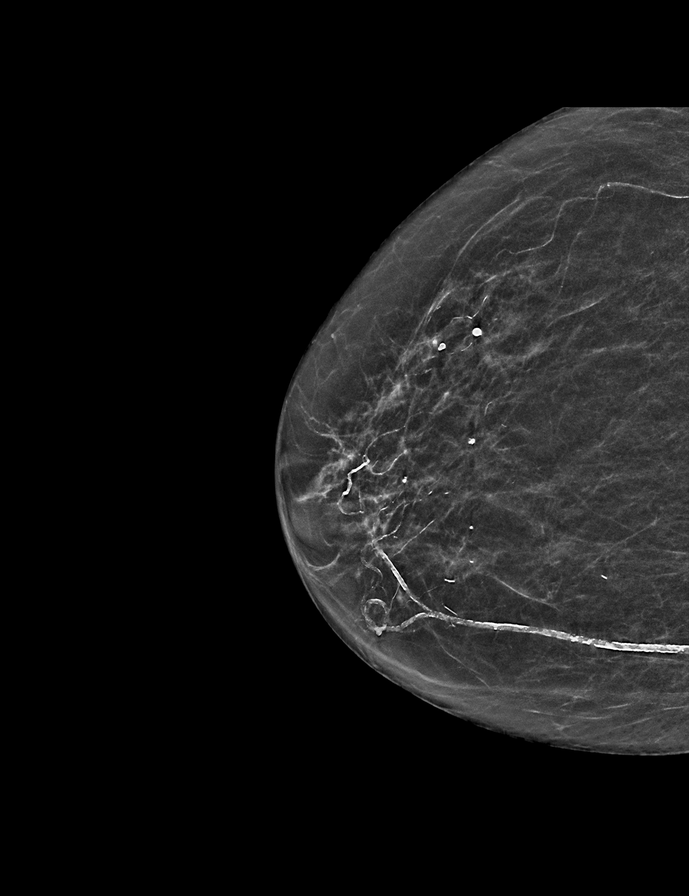

[9 of 29 positions shown; findings below may reference images not displayed]

ACR Breast Density Category b: There are scattered areas of
fibroglandular density.
FINDINGS: There are no findings suspicious for malignancy. Images were
processed with CAD.
IMPRESSION: No mammographic evidence of malignancy. A result letter of this
screening mammogram will be mailed directly to the patient.

RECOMMENDATION:
Screening mammogram in one year. (Code:97-6-RS4)

BI-RADS CATEGORY  1: Negative.

## 2018-11-04 MED ORDER — IRBESARTAN 150 MG PO TABS: 150.0000 mg | ORAL_TABLET | Freq: Every day | ORAL | 5 refills | Status: DC

## 2018-11-04 NOTE — Patient Instructions (Signed)
*   New goal INR* Take 1.5 tablet today Wednesday March 18, then continue with 5mg  daily except for 2.5 mg tablet every Monday, Wednesday and Friday. Recheck INR in 3 weeks.  Take 1/2 of the irbesartan 150 mg tablet once daily for 4 days, then increase to 1 tablet daily.

## 2018-11-13 ENCOUNTER — Other Ambulatory Visit: Payer: Self-pay | Admitting: Cardiovascular Disease

## 2018-11-21 ENCOUNTER — Other Ambulatory Visit: Payer: Self-pay | Admitting: Adult Health

## 2018-11-23 ENCOUNTER — Telehealth: Payer: Self-pay

## 2018-11-23 NOTE — Telephone Encounter (Signed)
Furosemide refilled. 

## 2018-11-23 NOTE — Telephone Encounter (Signed)

## 2018-11-24 ENCOUNTER — Ambulatory Visit (INDEPENDENT_AMBULATORY_CARE_PROVIDER_SITE_OTHER): Payer: Medicare HMO | Admitting: *Deleted

## 2018-11-24 ENCOUNTER — Other Ambulatory Visit: Payer: Self-pay

## 2018-11-24 DIAGNOSIS — I4891 Unspecified atrial fibrillation: Secondary | ICD-10-CM

## 2018-11-24 DIAGNOSIS — Z5181 Encounter for therapeutic drug level monitoring: Secondary | ICD-10-CM | POA: Diagnosis not present

## 2018-11-24 DIAGNOSIS — I482 Chronic atrial fibrillation, unspecified: Secondary | ICD-10-CM

## 2018-11-24 LAB — POCT INR: INR: 2.1 (ref 2.0–3.0)

## 2018-11-25 NOTE — Patient Instructions (Signed)
Description   * New goal INR* Called and spoke with pt, advised to continue with 5mg  daily except for 2.5 mg tablet every Monday, Wednesday and Friday. Recheck INR in 4 weeks.

## 2018-12-07 ENCOUNTER — Telehealth: Payer: Self-pay | Admitting: Pharmacist

## 2018-12-07 MED ORDER — POTASSIUM CHLORIDE CRYS ER 10 MEQ PO TBCR: 10.0000 meq | EXTENDED_RELEASE_TABLET | Freq: Every day | ORAL | 1 refills | Status: DC

## 2018-12-07 MED ORDER — FUROSEMIDE 40 MG PO TABS: 20.0000 mg | ORAL_TABLET | Freq: Two times a day (BID) | ORAL | 0 refills | Status: DC

## 2018-12-07 MED ORDER — ISOSORBIDE MONONITRATE ER 30 MG PO TB24: 30.0000 mg | ORAL_TABLET | Freq: Every day | ORAL | 1 refills | Status: DC

## 2018-12-07 NOTE — Telephone Encounter (Signed)
Patient call for clarify medication:  Taking: *Atenolol 25mg  2-3 times daily* *Furosemide 20mg  twice daily* *Potassium 1mEq (2 tablets daily)* *Isosorbide 30mg  daily*   Corrections: *Atenolol 50mg  twice daily* *Furosemide 20mg  (1/2 tab) twice daily* *Potassium 34mEq* *Isosorbide 30mg  daily* *Irbesartan 150mg  daily*

## 2018-12-16 ENCOUNTER — Other Ambulatory Visit: Payer: Self-pay | Admitting: Pharmacist

## 2018-12-16 NOTE — Patient Outreach (Signed)
[place] [facility]) Care Management  12/16/2018  Kristen Whitehead April 14, 1923 [id number]  Received a message from [employer] that on patient satisfaction survey patient expressed questions about use of ranitidine in light of recent recalls.   Successful phone outreach to patient, HIPAA details verified.  Patient expressed concerns with taking ranitidine due to it "causing cancer."   We discussed that ranitidine was withdrawn by the [place] and Drug Administration earlier in April 2020 due to a potential impurity in the medication being associated with a risk of cancer.  Patient asked what is being used in its place and we discussed the other medication in the drug class is famotidine---patient stated she didn't think famotidine worked for her in the past.   Offered to contact primacy care provider, Dr Drema Dallas, on patient's behalf as patient had not contacted provider per her report with her ranitidine inquiry and she requested this call be made.   Contacted Dr Drema Dallas' office, left message with [facility] regarding patient needing an alternative agent to ranitidine due to medication recall.   Plan:    Will await any potential return call from provider and will follow-up with patient in the next 2 weeks.   Karrie Meres, PharmD, [place] [phone]

## 2018-12-17 ENCOUNTER — Other Ambulatory Visit: Payer: Self-pay | Admitting: Pharmacist

## 2018-12-17 ENCOUNTER — Telehealth: Payer: Self-pay | Admitting: Pharmacist Clinician (PhC)/ Clinical Pharmacy Specialist

## 2018-12-17 MED ORDER — IRBESARTAN 150 MG PO TABS: 150.0000 mg | ORAL_TABLET | Freq: Every day | ORAL | 3 refills | Status: DC

## 2018-12-17 NOTE — Patient Outreach (Signed)
[place] [facility]) Care Management  12/17/2018  Kristen Whitehead [birth year] [id number]   Received a call from Joellen Jersey, with Dr Drema Dallas regarding ranitidine recall.    Per Joellen Jersey with Dr Drema Dallas, Dr Drema Dallas would like for patient to stop ranitidine and start famotidine 20 mg once daily.  Primary care doctor office would like for pharmacist to contact patient   Successful phone outreach to patient.  HIPAA details verified.  Discussed with patient that Dr Drema Dallas is recommending patient stop ranitidine due to withdrawal of medication from [place], and begin famotidine 20 mg once daily.  Discussed patient can obtain famotidine 20 mg over the counter and can take in the morning or evening.    Patient was advised to contact Dr Drema Dallas if she does not notice a resolution in symptoms over the next 1-2 weeks.    Plan:  Patient repeated directions back and confirmed understanding of medication change per Dr Drema Dallas office.    Patient is aware she can contact pharmacist if she has further questions.   Karrie Meres, PharmD, [place] [phone]

## 2018-12-17 NOTE — Telephone Encounter (Signed)
Patient calling to report that she is doing well with irbesartan added to her meds.  States she rarely sees any readings > 872 systolic anymore and more diastolic readings are < 158.    Because of her age and some variability in office vs home blood pressure readings, I am not going to increase the dose for now.  When we are able to bring her in the office later this summer for an in person evaluation, we can consider a dose increase at that time.    Rx sent to [facility]

## 2018-12-18 ENCOUNTER — Telehealth: Payer: Self-pay

## 2018-12-18 NOTE — Telephone Encounter (Signed)

## 2018-12-21 ENCOUNTER — Ambulatory Visit (INDEPENDENT_AMBULATORY_CARE_PROVIDER_SITE_OTHER): Payer: Medicare HMO | Admitting: *Deleted

## 2018-12-21 ENCOUNTER — Other Ambulatory Visit: Payer: Self-pay

## 2018-12-21 DIAGNOSIS — I482 Chronic atrial fibrillation, unspecified: Secondary | ICD-10-CM

## 2018-12-21 DIAGNOSIS — Z5181 Encounter for therapeutic drug level monitoring: Secondary | ICD-10-CM

## 2018-12-21 LAB — POCT INR: INR: 2 (ref 2.0–3.0)

## 2018-12-23 ENCOUNTER — Telehealth: Payer: Self-pay | Admitting: Pharmacist

## 2018-12-23 MED ORDER — IRBESARTAN 150 MG PO TABS: 150.0000 mg | ORAL_TABLET | Freq: Every day | ORAL | 3 refills | Status: DC

## 2018-12-23 MED ORDER — NITROGLYCERIN 0.4 MG SL SUBL: SUBLINGUAL_TABLET | SUBLINGUAL | 2 refills | Status: DC

## 2018-12-23 NOTE — Telephone Encounter (Signed)
I talked to Ms Nault about 2 weeks ago.   She is calling for additional questions related to BP medication.  12/23/2018:   *Patient wondering if she will be on irbesartan for long time and request 90 days supply from [facility]    *Also needs refill for NTG 0.4mg  SL

## 2018-12-23 NOTE — Telephone Encounter (Signed)
-----   Message from Leeroy Bock, [facility] sent at 12/21/2018  2:00 PM EDT ----- Regarding: HTN call I called Ms Biddle to dose her warfarin and she mentioned someone was supposed to call her to discuss her BP. I didn't see her on our [employer] so figured it might be one of you? She prefers a call either Wednesday or Thursday around noon.  Thanks! Jinny Blossom

## 2018-12-24 ENCOUNTER — Telehealth: Payer: Self-pay

## 2018-12-24 DIAGNOSIS — K900 Celiac disease: Secondary | ICD-10-CM | POA: Diagnosis not present

## 2018-12-24 DIAGNOSIS — E890 Postprocedural hypothyroidism: Secondary | ICD-10-CM | POA: Diagnosis not present

## 2018-12-24 DIAGNOSIS — Z85850 Personal history of malignant neoplasm of thyroid: Secondary | ICD-10-CM | POA: Diagnosis not present

## 2018-12-24 NOTE — Telephone Encounter (Signed)
Virtual Visit Pre-Appointment Phone Call [place] PHONE  "(Name), I am calling you today to discuss your upcoming appointment. We are currently trying to limit exposure to the virus that causes COVID-19 by seeing patients at home rather than in the office."  1. "What is the BEST phone number to call the day of the visit?" - include this in appointment notes  2. "Do you have or have access to (through a family member/friend) a smartphone with video capability that we can use for your visit?" a. If yes - list this number in appt notes as "cell" (if different from BEST phone #) and list the appointment type as a VIDEO visit in appointment notes b. If no - list the appointment type as a PHONE visit in appointment notes  3. Confirm consent - "In the setting of the current Covid19 crisis, you are scheduled for a (phone or video) visit with your provider on (date) at (time).  Just as we do with many in-office visits, in order for you to participate in this visit, we must obtain consent.  If you'd like, I can send this to your mychart (if signed up) or email for you to review.  Otherwise, I can obtain your verbal consent now.  All virtual visits are billed to your insurance company just like a normal visit would be.  By agreeing to a virtual visit, we'd like you to understand that the technology does not allow for your provider to perform an examination, and thus may limit your provider's ability to fully assess your condition. If your provider identifies any concerns that need to be evaluated in person, we will make arrangements to do so.  Finally, though the technology is pretty good, we cannot assure that it will always work on either your or our end, and in the setting of a video visit, we may have to convert it to a phone-only visit.  In either situation, we cannot ensure that we have a secure connection.  Are you willing to proceed?" STAFF: Did the patient verbally acknowledge consent to  telehealth visit? Document YES/NO here:   4. Advise patient to be prepared - "Two hours prior to your appointment, go ahead and check your blood pressure, pulse, oxygen saturation, and your weight (if you have the equipment to check those) and write them all down. When your visit starts, your provider will ask you for this information. If you have an Apple Watch or Kardia device, please plan to have heart rate information ready on the day of your appointment. Please have a pen and paper handy nearby the day of the visit as well."  5. Give patient instructions for MyChart download to smartphone OR [email or web address] as below if video visit (depending on what platform provider is using)  6. Inform patient they will receive a phone call 15 minutes prior to their appointment time (may be from unknown caller ID) so they should be prepared to answer    [place] has been deemed a candidate for a follow-up tele-health visit to limit community exposure during the Covid-19 pandemic. I spoke with the patient via phone to ensure availability of phone/video source, confirm preferred email & phone number, and discuss instructions and expectations.  I reminded Kristen Whitehead to be prepared with any vital sign and/or heart rhythm information that could potentially be obtained via home monitoring, at the time of her visit. I reminded Kristen Whitehead to expect a  phone call prior to her visit.  Dante 12/24/2018 2:41 PM   INSTRUCTIONS FOR DOWNLOADING THE MYCHART APP TO SMARTPHONE  - The patient must first make sure to have activated MyChart and know their login information - If Apple, go to [employer] and type in MyChart in the search bar and download the app. If Android, ask patient to go to [employer] and type in [place] in the search bar and download the app. The app is free but as with any other app downloads, their phone may require them to verify saved  payment information or Apple/Android password.  - The patient will need to then log into the app with their MyChart username and password, and select [place] as their healthcare provider to link the account. When it is time for your visit, go to the MyChart app, find appointments, and click Begin Video Visit. Be sure to Select Allow for your device to access the Microphone and Camera for your visit. You will then be connected, and your provider will be with you shortly.  **If they have any issues connecting, or need assistance please contact [place] [phone])  **If using a computer, in order to ensure the best quality for their visit they will need to use either of the following Internet Browsers: [employer], or Google Chrome  IF USING DOXIMITY or DOXY.ME - The patient will receive a link just prior to their visit by text.     FULL LENGTH CONSENT FOR TELE-HEALTH VISIT   I hereby voluntarily request, consent and authorize [facility] and its employed or contracted physicians, physician assistants, nurse practitioners or other licensed health care professionals (the Practitioner), to provide me with telemedicine health care services (the "Services") as deemed necessary by the treating Practitioner. I acknowledge and consent to receive the Services by the Practitioner via telemedicine. I understand that the telemedicine visit will involve communicating with the Practitioner through live audiovisual communication technology and the disclosure of certain medical information by electronic transmission. I acknowledge that I have been given the opportunity to request an in-person assessment or other available alternative prior to the telemedicine visit and am voluntarily participating in the telemedicine visit.  I understand that I have the right to withhold or withdraw my consent to the use of telemedicine in the course of my care at any time, without affecting my  right to future care or treatment, and that the Practitioner or I may terminate the telemedicine visit at any time. I understand that I have the right to inspect all information obtained and/or recorded in the course of the telemedicine visit and may receive copies of available information for a reasonable fee.  I understand that some of the potential risks of receiving the Services via telemedicine include:  Marland Kitchen Delay or interruption in medical evaluation due to technological equipment failure or disruption; . Information transmitted may not be sufficient (e.g. poor resolution of images) to allow for appropriate medical decision making by the Practitioner; and/or  . In rare instances, security protocols could fail, causing a breach of personal health information.  Furthermore, I acknowledge that it is my responsibility to provide information about my medical history, conditions and care that is complete and accurate to the best of my ability. I acknowledge that Practitioner's advice, recommendations, and/or decision may be based on factors not within their control, such as incomplete or inaccurate data provided by me or distortions of diagnostic images or specimens that may  result from electronic transmissions. I understand that the practice of medicine is not an exact science and that Practitioner makes no warranties or guarantees regarding treatment outcomes. I acknowledge that I will receive a copy of this consent concurrently upon execution via email to the email address I last provided but may also request a printed copy by calling the office of [facility].    I understand that my insurance will be billed for this visit.   I have read or had this consent read to me. . I understand the contents of this consent, which adequately explains the benefits and risks of the Services being provided via telemedicine.  . I have been provided ample opportunity to ask questions regarding this consent and the  Services and have had my questions answered to my satisfaction. . I give my informed consent for the services to be provided through the use of telemedicine in my medical care  By participating in this telemedicine visit I agree to the above.

## 2018-12-28 ENCOUNTER — Ambulatory Visit: Payer: Self-pay

## 2018-12-28 ENCOUNTER — Telehealth: Payer: Self-pay | Admitting: Cardiovascular Disease

## 2018-12-29 ENCOUNTER — Telehealth (INDEPENDENT_AMBULATORY_CARE_PROVIDER_SITE_OTHER): Payer: Medicare HMO | Admitting: Cardiovascular Disease

## 2018-12-29 ENCOUNTER — Encounter: Payer: Self-pay | Admitting: Cardiovascular Disease

## 2018-12-29 DIAGNOSIS — I110 Hypertensive heart disease with heart failure: Secondary | ICD-10-CM | POA: Diagnosis not present

## 2018-12-29 DIAGNOSIS — I10 Essential (primary) hypertension: Secondary | ICD-10-CM

## 2018-12-29 DIAGNOSIS — I4891 Unspecified atrial fibrillation: Secondary | ICD-10-CM

## 2018-12-29 DIAGNOSIS — I5032 Chronic diastolic (congestive) heart failure: Secondary | ICD-10-CM

## 2018-12-29 MED ORDER — IRBESARTAN 150 MG PO TABS: 150.0000 mg | ORAL_TABLET | Freq: Two times a day (BID) | ORAL | 3 refills | Status: DC

## 2018-12-29 NOTE — Patient Instructions (Addendum)
Medication Instructions:  INCREASE IRBESARTAN TO 150 MG TWICE A DAY   Medications should be taken as follows:   Atenolol 50mg  at noon and midnight Furosemide 20mg  at noon and midnight Potassium 10 mEq at noon  Isosorbide 30mg  at noon Potassium 10 mEq at noon Irbesartan to 150mg  at noon and midnight   If you need a refill on your cardiac medications before your next appointment, please call your pharmacy.   Lab work: BMET/BNP IN 1 WEEK   If you have labs (blood work) drawn today and your tests are completely normal, you will receive your results only by: Marland Kitchen MyChart Message (if you have MyChart) OR . A paper copy in the mail If you have any lab test that is abnormal or we need to change your treatment, we will call you to review the results.  Testing/Procedures: NONE   Follow-Up: At [facility], you and your health needs are our priority.  As part of our continuing mission to provide you with exceptional heart care, we have created designated Provider Care Teams.  These Care Teams include your primary Cardiologist (physician) and Advanced Practice Providers (APPs -  Physician Assistants and Nurse Practitioners) who all work together to provide you with the care you need, when you need it. You will need a follow up appointment in 4 months.  Please call our office 2 months in advance to schedule this appointment.  You may see [name], MD or one of the following Advanced Practice Providers on your designated Care Team:   Kerin Ransom, PA-C Roby Lofts, [place] . Sande Rives, PA-C  Your physician recommends that you schedule a follow-up appointment in: PHARM D IN 1 MONTH, THE PHARMACIST WILL CALL YOU WITH DATE AND TIME

## 2018-12-29 NOTE — Progress Notes (Signed)
Virtual Visit via Telephone Note   This visit type was conducted due to national recommendations for restrictions regarding the COVID-19 Pandemic (e.g. social distancing) in an effort to limit this patient's exposure and mitigate transmission in our community.  Due to her co-morbid illnesses, this patient is at least at moderate risk for complications without adequate follow up.  This format is felt to be most appropriate for this patient at this time.  The patient did not have access to video technology/had technical difficulties with video requiring transitioning to audio format only (telephone).  All issues noted in this document were discussed and addressed.  No physical exam could be performed with this format.  Please refer to the patient's chart for her  consent to telehealth for [facility].   Date:  12/29/2018   ID:  Kristen Whitehead, DOB 06/04/1923, MRN [id number]  Patient Location: Home Provider Location: Office  PCP:  Leighton Ruff, MD  Cardiologist:  [name], MD  Electrophysiologist:  None   Evaluation Performed:  Follow-Up Visit  Chief Complaint:  hypertension  History of Present Illness:    Kristen Whitehead is a 85 y.o. female with chronic atrial fibrillation, hypertension, hyperlipidemia, mild MR and AR and chronic diastolic heart failure who presents for follow up.  Kristen Whitehead was previously a patient of [name].  She last saw him 08/2015 for follow-up on frequent episodes of chest pain. She had been referred for stress testing but declined, as she has not tolerated Lexiscan well in the past. Troponin was checked and was negative. This is been a chronic problem so further evaluation was deferred.  Her stress test in 2013 was negative for ischemia.  She reports having "heart attacks" that occur when she overexerts herself.  When this happens she gets pain across her chest that is associated with shortness of breath.  She denies lower extremity  edema, orthopnea or PND.  She watched a documentary on broken heart syndrome and thinks this must be what happens to her.  When she gets the pain she prays for God to take it away and eventually the pain subsides.  There is no associated nausea or diaphoresis.  Imdur was increased to 90 mg daily on 10/2015.  Since then she had an episode of heart failure 01/2016 that responded to increased lasix.  She also has chronic exertional dyspnea.   Kristen Whitehead saw [name], DNP, on 12/11/16.  Lasix was increased to 60 mg in the morning and 40 in the afternoon.  There was no change in her symptoms.  At her last appointment Kristen Whitehead was volume overloaded so lasix was increased. She followed up with  [name], DNP on 01/2018 and her energy was worse but her BP was better.  She felt better with physical activity.  Her BP was low in the morning so Imdur was decreased to 60mg  and she started taking it in the morning instead of the afternoon.  She notices that her energy levels have been better.  Her blood pressure is somewhat labile but overall well-controlled.  Some shortness of breath with exertion but rest when this occurs and it improves.  She is not interested in further testing.  She has had [employer] testing in the past and was told that she had mild blockage in her carotids.  She denies any vision changes, numbness, weakness, or slurred speech.  She is wondering if she should continue to have this tested each year.  Stable but sometimes fluctuates  up to 3 pounds in a day.  It typically improves by the following day without making her furosemide.  Lately she has had less of an appetite.  Since her last appointment she has been doing well.  She has been staying home due to COVID-19.  She gets short of breath when she exerts herself or when she lays down.  She feels OK when she is upright.  Last night she gained 2lb but lately it has been stable.  She has been working closely with our pharmacist,  Tommy Medal, which has been helpful.  Lasix was reduced to 20mg  bid from 40mg  bid.  She doesn't think that this has affected her breathing.  Her blood pressure has been labile.  It has ranged from the 140s to the 190s.  Overall she has felt well.  Family members bring her groceries.  Her sleeping patterns are erratic so she doesn't take her medication at standard times.  She sometimes take   The patient does not have symptoms concerning for COVID-19 infection (fever, chills, cough, or new shortness of breath).    Past Medical History:  Diagnosis Date   Bladder prolapse, female, acquired    Cancer [facility])    thyroid   Chest pain    no known ischemic heart disease; negative Myoview July 2013. EF 75% with no ischemia.    Chronic anticoagulation    Chronic atrial fibrillation    managed with rate control and coumadin   Chronic diastolic heart failure (HCC)    GERD (gastroesophageal reflux disease)    Heart disease    History of congenital mitral regurgitation    mild   History of mitral valve prolapse 06/15/2008   a. echo 1/14: mild LVH, EF 60-65%, mild MR, mild to mod BAE, PASP 35   Hypercholesterolemia    Hypertension    Hypothyroidism    Past Surgical History:  Procedure Laterality Date   ABDOMINAL HYSTERECTOMY     CATARACT EXTRACTION     CHOLECYSTECTOMY     Thyroidectomy       Current Meds  Medication Sig   atenolol (TENORMIN) 50 MG tablet Take 1 tablet (50 mg total) by mouth 2 (two) times daily.   Calcium Carbonate-Vitamin D (CALCIUM 600+D PO) Take 1 tablet by mouth 2 (two) times daily.   Cholecalciferol (VITAMIN D) 2000 UNITS tablet Take 4,000 Units by mouth daily.    cycloSPORINE (RESTASIS) 0.05 % ophthalmic emulsion Place 1 drop into both eyes 2 (two) times daily.   dorzolamide (TRUSOPT) 2 % ophthalmic solution Place 1 drop into the left eye 2 (two) times daily.   famotidine (PEPCID) 20 MG tablet Take 20 mg by mouth daily.   furosemide (LASIX)  40 MG tablet Take 0.5 tablets (20 mg total) by mouth 2 (two) times daily.   guaiFENesin (ROBITUSSIN) 100 MG/5ML liquid Take 5-10 mLs (100-200 mg total) by mouth every 4 (four) hours as needed for cough.   irbesartan (AVAPRO) 150 MG tablet Take 1 tablet (150 mg total) by mouth 2 (two) times a day.   isosorbide mononitrate (IMDUR) 30 MG 24 hr tablet Take 1 tablet (30 mg total) by mouth daily.   latanoprost (XALATAN) 0.005 % ophthalmic solution Place 1 drop into both eyes at bedtime.    levothyroxine (SYNTHROID, LEVOTHROID) 100 MCG tablet Take 100 mcg by mouth daily.    MAGNESIUM OXIDE 400 PO Take 800 mg by mouth daily.   Multiple Vitamins-Minerals (CENTRUM SILVER PO) Take 1 tablet by mouth daily.  Multiple Vitamins-Minerals (PRESERVISION AREDS 2 PO) Take 1 capsule by mouth 2 (two) times daily.    nitroGLYCERIN (NITROSTAT) 0.4 MG SL tablet DISSOLVE 1 TABLET UNDER THE TONGUE EVERY 5 MINUTES FOR 3 DOSES AS NEEDED  FOR  CHEST  PAIN   potassium chloride (K-DUR) 10 MEQ tablet Take 1 tablet (10 mEq total) by mouth daily.   pramipexole (MIRAPEX) 0.125 MG tablet Take 1 tablet (0.125 mg total) by mouth every evening.   PREMARIN vaginal cream Place 2 g vaginally 2 (two) times a week. Uses on Sunday and wednesday   Probiotic Product (PROBIOTIC DAILY PO) Take 1 tablet by mouth daily.   temazepam (RESTORIL) 15 MG capsule Take 15 mg by mouth at bedtime.   warfarin (COUMADIN) 5 MG tablet TAKE 1/2 TO 1 TABLET DAILY AS DIRECTED BY COUMADIN CLINIC (Patient taking differently: Take 1/2 tab Mon,Wed, Friday and 1 tab on the other days as directed by coumadin clinic)   [DISCONTINUED] irbesartan (AVAPRO) 150 MG tablet Take 1 tablet (150 mg total) by mouth daily.     Allergies:   Azithromycin; Compazine [prochlorperazine edisylate]; Demerol; Dolophine [methadone]; Ebastine; Erythromycin; Ibuprofen; Statins; Tetanus toxoids; and Tetracyclines & related   Social History   Tobacco Use   Smoking status:  Never Smoker   Smokeless tobacco: Never Used  Substance Use Topics   Alcohol use: No   Drug use: No     Family Hx: The patient's family history includes Stroke in her father and mother. There is no history of Heart attack or Heart disease.  ROS:   Please see the history of present illness.     All other systems reviewed and are negative.   Prior CV studies:   The following studies were reviewed today:  Echo 01/13/18:  Study Conclusions  - Left ventricle: The cavity size was normal. Wall thickness was   normal. Systolic function was normal. The estimated ejection   fraction was in the range of 60% to 65%. Indeterminant diastolic   function (atrial fibrillation). Wall motion was normal; there   were no regional wall motion abnormalities. - Aortic valve: Trileaflet; moderately calcified leaflets.   Sclerosis without stenosis. There was trivial regurgitation. - Mitral valve: Moderately calcified annulus. There was trivial   regurgitation. - Left atrium: The atrium was moderately dilated. - Right ventricle: The cavity size was normal. Systolic function   was normal. - Right atrium: The atrium was moderately dilated. - Tricuspid valve: Peak RV-RA gradient (S): 37 mm Hg. - Pulmonary arteries: PA peak pressure: 52 mm Hg (S). - Systemic veins: IVC measured 2.5 cm with < 50% respirophasic   variation, suggesting RA pressure 15 mmHg.  Impressions:  - The patient was in atrial fibrillation. Normal LV size with EF   60-65%. Normal RV size and systolic function. Moderate pulmonary   hypertension. Moderate biatrial enlargement.   Labs/Other Tests and Data Reviewed:    EKG:  No ECG reviewed.  Recent Labs: 01/16/2018: NT-Pro BNP 1,923 10/10/2018: ALT 23; BUN 9; Creatinine, Ser 0.65; Hemoglobin 12.5; Platelets 184; Potassium 4.4; Sodium 134   Recent Lipid Panel Lab Results  Component Value Date/Time   CHOL 211 (H) 11/04/2012 03:24 PM   TRIG 201.0 (H) 11/04/2012 03:24 PM     HDL 66.80 11/04/2012 03:24 PM   CHOLHDL 3 11/04/2012 03:24 PM   LDLCALC 109 (H) 04/16/2012 10:43 AM   LDLDIRECT 98.8 11/04/2012 03:24 PM   06/24/18: Cholesterol 187, Triglycerides 389, HDL 41, LDL 68   Wt Readings from  Last 3 Encounters:  12/29/18 133 lb 6.4 oz (60.5 kg)  09/29/18 134 lb 6.4 oz (61 kg)  09/21/18 134 lb 3.2 oz (60.9 kg)     Objective:    BP (!) 176/92    Pulse 62    Ht 4\' 11"  (1.499 m)    Wt 133 lb 6.4 oz (60.5 kg)    BMI 26.94 kg/m  GENERAL: Well-appearing.  No acute distress.  No respiratory distress. NEURO:  Speech fluent.   PSYCH:  Cognitively intact, oriented to person place and time   ASSESSMENT & PLAN:    # Hypertension: BP is better but still poorly controlled.  Goal is <140/90.  We will increase irbesartan to 150 mg bid.  She stays up late and will start taking meds at noon and midnight.  She has been taking atenolol 25mg  qid. Switch to 50mg  bid (as previously instructed).  Check BMP in 1 week.   # Chest pain: Resolved.     # Chronic atrial fibrillation: Rate remains well-controlled.  Continue atenolol and warfarin.   This patients CHA2DS2-VASc Score and unadjusted Ischemic Stroke Rate (% per year) is equal to 4.8 % stroke rate/year from a score of 4  Above score calculated as 1 point each if present [CHF, HTN, DM, Vascular=MI/PAD/Aortic Plaque, Age if 65-74, or Female] Above score calculated as 2 points each if present [Age > 75, or Stroke/TIA/TE]   # Chronic diastolic heart failure: Kristen Whitehead reports orthopnea but weight is stable and she has no edema.  Check BNP.  BP control as above.   # Carotid Dopplers:  # Hyperlipidemia: Continue warfarin.  LDL was 68 on 06/2018.   COVID-19 Education: The signs and symptoms of COVID-19 were discussed with the patient and how to seek care for testing (follow up with PCP or arrange E-visit).  The importance of social distancing was discussed today.  Time:   Today, I have spent 34 minutes with the  patient with telehealth technology discussing the above problems.     Medication Adjustments/Labs and Tests Ordered: Current medicines are reviewed at length with the patient today.  Concerns regarding medicines are outlined above.   Tests Ordered: Orders Placed This Encounter  Procedures   Basic metabolic panel   Pro b natriuretic peptide (BNP)[id number] [facility])    Medication Changes: Meds ordered this encounter  Medications   irbesartan (AVAPRO) 150 MG tablet    Sig: Take 1 tablet (150 mg total) by mouth 2 (two) times a day.    Dispense:  180 tablet    Refill:  3    NEW DOSE, D/C PREVIOUS RX    Disposition:  Follow up with Erasmo Downer in 1 month.  F/u with me in 4 months.  Signed, [name], MD  12/29/2018 2:38 PM    [facility]

## 2018-12-31 ENCOUNTER — Other Ambulatory Visit: Payer: Self-pay

## 2018-12-31 NOTE — Patient Outreach (Signed)
[facility]) Care Management  12/31/2018  Kristen Whitehead June 23, 1923 [id number]    1st outreach attempt to the patient for assessment.  No answer.  HIPAA compliant voicemail left with contact information.  Plan: RN Health Coach will send letter. [place] will make outreach attempt to the patient within thirty business days.   Lazaro Arms RN, BSN, [place] Direct Dial:  [phone]  Fax: [phone]

## 2019-01-01 ENCOUNTER — Telehealth: Payer: Self-pay | Admitting: Cardiovascular Disease

## 2019-01-01 NOTE — Telephone Encounter (Signed)
New Message            Patient is calling, states she not feeling that well, she states her rt arm feels sore, lf arm feels painful when she moves it.. Patient states she is now wheezing. Patient request that we let the phone ring until she can get to it.

## 2019-01-01 NOTE — Telephone Encounter (Signed)
Spoke with patient and she had wheezing last night and did not sleep well. patient denies fever, cough, or chills. She stated her Lasix had been reduced to 40 mg 1/2 tablet twice a day a while. Her weight was 131.6 lb Monday and 133.6 today, up and down all week. She feels "tight" in her rib area. When her weight goes up she feels the tightness. No wheezing today.  Advised patient to increase her Lasix to 40 mg twice a day for 2 days and call back Monday with update. Advised if shortness of breath or no improvement to go to ED. If she did not feel bad enough to go to ED she could call office and have on call MD call her back. Patient verbalized understanding.

## 2019-01-04 MED ORDER — IRBESARTAN 150 MG PO TABS: 150.0000 mg | ORAL_TABLET | Freq: Two times a day (BID) | ORAL | 3 refills | Status: DC

## 2019-01-04 NOTE — Telephone Encounter (Signed)
OK very good.  That sounds fine.

## 2019-01-04 NOTE — Telephone Encounter (Signed)
Follow up     Pt is calling back about her Blood Pressure over the weekend. She would like for [facility] to call her back    Please call back

## 2019-01-04 NOTE — Telephone Encounter (Signed)
Spoke with patient and she states her weight is down 2 pounds and feeling much better now.  Advised ok to take an extra Lasix for weight gain of 2-3 pounds in 24 hours or 5 pounds in 7 days.   Will forward to Dr Oval Linsey for review

## 2019-01-05 DIAGNOSIS — I4891 Unspecified atrial fibrillation: Secondary | ICD-10-CM | POA: Diagnosis not present

## 2019-01-05 DIAGNOSIS — I5032 Chronic diastolic (congestive) heart failure: Secondary | ICD-10-CM | POA: Diagnosis not present

## 2019-01-05 DIAGNOSIS — I10 Essential (primary) hypertension: Secondary | ICD-10-CM | POA: Diagnosis not present

## 2019-01-06 LAB — BASIC METABOLIC PANEL
BUN/Creatinine Ratio: 15 (ref 12–28)
BUN: 12 mg/dL (ref 10–36)
CO2: 28 mmol/L (ref 20–29)
Calcium: 8.4 mg/dL — ABNORMAL LOW (ref 8.7–10.3)
Chloride: 101 mmol/L (ref 96–106)
Creatinine, Ser: 0.8 mg/dL (ref 0.57–1.00)
GFR calc Af Amer: 73 mL/min/{1.73_m2} (ref >59)
GFR calc non Af Amer: 63 mL/min/{1.73_m2} (ref >59)
Glucose: 97 mg/dL (ref 65–99)
Potassium: 3.7 mmol/L (ref 3.5–5.2)
Sodium: 139 mmol/L (ref 134–144)

## 2019-01-06 LAB — PRO B NATRIURETIC PEPTIDE: NT-Pro BNP: 2323 pg/mL — ABNORMAL HIGH (ref 0–738)

## 2019-01-06 NOTE — Telephone Encounter (Signed)
Advised patient of lab results and medication changes, verbalized understanding  Scheduled follow up

## 2019-01-06 NOTE — Telephone Encounter (Signed)
-----   Message from [name], MD sent at 01/06/2019  1:05 PM EDT ----- Heart failure labs show that her heart failure is not controlled.  Increase lasix to 40mg  bid.  She needs f/u within a month.  Call back if she isn't feeling better.

## 2019-01-12 NOTE — Telephone Encounter (Signed)
Follow up   Patient has gained 2 lbs and wants a call to discuss due to swelling.  Please call to discuss.

## 2019-01-12 NOTE — Telephone Encounter (Addendum)
Pt has been taking Lasix 40mg  po 1/2 BID... according to Dr. [place] Wazeecha's/ Melinda's last note she can take an extra 1/2 tab when weight has increased 2lbs in a 24 hour period.. she says she feels well but mild ankle edema... will monitor and will call back if no change....she says that her BP has been getting better each day and she has been feeling well.   However... will need to send to Dr. Oval Linsey for review... pt has been taking Lasix 40mg  1/2 bid but according to her last OV she was to be taking Lasix 40mg  po bid.   Pt will take an extra 1/2 now but will need to talk with Dr. Oval Linsey about her dose going forward.   Pt needs RX sent in to [facility] for all meds but will wait until we talk with Dr. Oval Linsey.

## 2019-01-13 ENCOUNTER — Telehealth: Payer: Self-pay | Admitting: *Deleted

## 2019-01-13 DIAGNOSIS — N8182 Incompetence or weakening of pubocervical tissue: Secondary | ICD-10-CM | POA: Diagnosis not present

## 2019-01-13 NOTE — Telephone Encounter (Signed)
Spoke with patient to discuss Furosemide and recent increased dose. Patient stated she misunderstood and did not increase Furosemide to 40 mg twice day as recommended after labs 01/05/19. Patient stated that after taking the Furosemide 40 mg late yesterday she was up all night urinating. She said she could not do that secondary to no rest at all, slept less than 1 hour. Weight down 2 1/2 pounds. She thought some wrong with her pessary and saw GYN today. Advised patient to try increasing her Lasix to 40 mg in the morning and 1/2 in the evening and call back next week with update. Patient verbalized understanding. She did write down as well    Stephani Police, RN at 01/12/2019 3:19 PM   Status: Addendum    Pt has been taking Lasix 40mg  po 1/2 BID... according to Dr. [place]'s/ Suheyb Raucci's last note she can take an extra 1/2 tab when weight has increased 2lbs in a 24 hour period.. she says she feels well but mild ankle edema... will monitor and will call back if no change....she says that her BP has been getting better each day and she has been feeling well.   However... will need to send to Dr. Oval Linsey for review... pt has been taking Lasix 40mg  1/2 bid but according to her last OV she was to be taking Lasix 40mg  po bid.   Pt will take an extra 1/2 now but will need to talk with Dr. Oval Linsey about her dose going forward.   Pt needs RX sent in to [facility] for all meds but will wait until we talk with Dr. Oval Linsey.       Revision History                     Maryjane Hurter at 01/12/2019 2:58 PM   Status: Signed    Follow up   Patient has gained 2 lbs and wants a call to discuss due to swelling.  Please call to discuss.

## 2019-01-14 ENCOUNTER — Other Ambulatory Visit: Payer: Self-pay | Admitting: Cardiovascular Disease

## 2019-01-14 NOTE — Telephone Encounter (Signed)
If she needs extra lasix she can increase to 80mg  for two days then go back to 40mg .

## 2019-01-15 NOTE — Telephone Encounter (Signed)
Will follow up with patient in about a week to see how she is doing, she was going to try 40 mg in am and 20 mg in afternoon to see how she tolerates

## 2019-01-18 DIAGNOSIS — M25531 Pain in right wrist: Secondary | ICD-10-CM | POA: Diagnosis not present

## 2019-01-18 NOTE — Telephone Encounter (Addendum)
Pt called to report that she took a fall yesterday.. [place] and she was walking on an uneven side walk and she was talking and walking and tripped and fell.. her friends called EMS she did not go to the ED but they bandaged up her wounds. She says she fell on her Rt hip and has bruising but no pain with walking. She denies feeling dizzy or chest pain or anything before she fell.   I urged her to call her PCP Dr. Drema Dallas to follow up and be sure she does not have any injuries that need to be treated.    She says that her BP has been doing very well.. but this morning it is 160/101 HR 74... she says she had not taken any of her meds this morning yet. I advised her take her meds and to record them for a few days and call us if not improving. Pt says her peripheral edema has also been getting better but still mildly puffy.. she says she goes a lot and never rests.. I advised her to be sure she is watching her NA intake and to elevate her feet when sitting and she agreed. She will continue to monitor and call us if she has any further problems especially with her BP... Pt does not have a headache today.

## 2019-01-18 NOTE — Telephone Encounter (Signed)
I agree with recommendations as outlined. Great note. Thank you!

## 2019-01-18 NOTE — Telephone Encounter (Signed)
  Patient increased her dosage of Furosemide per previous note and she is calling because she fell yesterday. She states she was not dizzy and does not know why she fell.  Her blood pressure this morning is 160/101 pulse 74. She still has some swelling in her feet and she states she feels it in her chest. She gained one pound in the last few days. She is still having issues being able to sleep. She needs to know if she should stay on this new dosage of the furosemide. She needs to know so she can fix her medications for the week.

## 2019-01-20 ENCOUNTER — Telehealth: Payer: Self-pay

## 2019-01-20 ENCOUNTER — Other Ambulatory Visit: Payer: Self-pay

## 2019-01-20 NOTE — Telephone Encounter (Signed)
I called pt, offered her an in office visit for her appt on 01/25/19. Pt can't do a VV. Pt would prefer to delay her yearly appt until August. New appt was scheduled for 03/24/19 at 11:30am. Pt verbalized understanding of new appt date and time.

## 2019-01-20 NOTE — Patient Outreach (Signed)
[facility]) Care Management  01/20/2019   Kristen Whitehead May 15, 1923 [id number]  Subjective: Return call from the patient.  Two patient identifiers obtained.  She states that she is doing fair.  She denies any chest pain or swelling.  She states that she still has shortness of breath with exertion and some when she lies down.  Advised the patient to inform her physician of an new symptoms that she may be having.  She verbalized understanding.  She states she fell on Sunday outside.  She does not know why she fell because she didn't trip or twist her foot the wrong way.  She landed on her right hip and stayed sitting until EMS came and checked her out.  She did not sustain any injuries but did have bruising. Discussed fall precautions.  She would like to try using a walker.  Advised her that would be a good Idea and to talk with her PCP.  She verbalized understanding. She states that she has not weighed today but normally weighs daily. She is taking her medications as prescribed.  She states she put in a call this morning to her PCP office regarding a bandage on her hand and is waiting for a return call.  Advised her to discuss her other issues with the nurse when she calls.   Current Medications:  Current Outpatient Medications  Medication Sig Dispense Refill  . atenolol (TENORMIN) 50 MG tablet Take 1 tablet (50 mg total) by mouth 2 (two) times daily. 180 tablet 3  . benzonatate (TESSALON) 100 MG capsule Take 100 mg by mouth 3 (three) times daily as needed for cough.    . Calcium Carbonate-Vitamin D (CALCIUM 600+D PO) Take 1 tablet by mouth 2 (two) times daily.    . Cholecalciferol (VITAMIN D) 2000 UNITS tablet Take 4,000 Units by mouth daily.     . cycloSPORINE (RESTASIS) 0.05 % ophthalmic emulsion Place 1 drop into both eyes 2 (two) times daily.    . dorzolamide (TRUSOPT) 2 % ophthalmic solution Place 1 drop into the left eye 2 (two) times daily.    . famotidine (PEPCID) 20  MG tablet Take 20 mg by mouth daily.    . furosemide (LASIX) 40 MG tablet Take 40 mg by mouth 2 (two) times daily.    Marland Kitchen guaiFENesin (ROBITUSSIN) 100 MG/5ML liquid Take 5-10 mLs (100-200 mg total) by mouth every 4 (four) hours as needed for cough. 60 mL 0  . irbesartan (AVAPRO) 150 MG tablet Take 1 tablet (150 mg total) by mouth 2 (two) times a day. 180 tablet 3  . isosorbide mononitrate (IMDUR) 30 MG 24 hr tablet TAKE 3 TABLETS EVERY DAY 270 tablet 1  . latanoprost (XALATAN) 0.005 % ophthalmic solution Place 1 drop into both eyes at bedtime.     Marland Kitchen levothyroxine (SYNTHROID, LEVOTHROID) 100 MCG tablet Take 100 mcg by mouth daily.     Marland Kitchen MAGNESIUM OXIDE 400 PO Take 800 mg by mouth daily.    . Multiple Vitamins-Minerals (CENTRUM SILVER PO) Take 1 tablet by mouth daily.    . Multiple Vitamins-Minerals (PRESERVISION AREDS 2 PO) Take 1 capsule by mouth 2 (two) times daily.     . nitroGLYCERIN (NITROSTAT) 0.4 MG SL tablet DISSOLVE 1 TABLET UNDER THE TONGUE EVERY 5 MINUTES FOR 3 DOSES AS NEEDED  FOR  CHEST  PAIN 50 tablet 2  . potassium chloride (K-DUR) 10 MEQ tablet Take 1 tablet (10 mEq total) by mouth daily. 90 tablet 1  .  pramipexole (MIRAPEX) 0.125 MG tablet Take 1 tablet (0.125 mg total) by mouth every evening. 30 tablet 5  . PREMARIN vaginal cream Place 2 g vaginally 2 (two) times a week. Uses on Sunday and wednesday    . Probiotic Product (PROBIOTIC DAILY PO) Take 1 tablet by mouth daily.    . temazepam (RESTORIL) 15 MG capsule Take 15 mg by mouth at bedtime.    Marland Kitchen warfarin (COUMADIN) 5 MG tablet TAKE 1/2 TO 1 TABLET DAILY AS DIRECTED BY COUMADIN CLINIC (Patient taking differently: Take 1/2 tab Mon,Wed, Friday and 1 tab on the other days as directed by coumadin clinic) 90 tablet 0   No current facility-administered medications for this visit.     Functional Status:  In your present state of health, do you have any difficulty performing the following activities: 10/01/2018 04/02/2018  Hearing? N N   Vision? N Y  Difficulty concentrating or making decisions? N N  Walking or climbing stairs? Y Y  Dressing or bathing? N N  Doing errands, shopping? Y N  Preparing Food and eating ? N -  Using the Toilet? N -  In the past six months, have you accidently leaked urine? N -  Do you have problems with loss of bowel control? N -  Managing your Medications? Y -  Managing your Finances? N -  Housekeeping or managing your Housekeeping? Y -  Some recent data might be hidden    Fall/Depression Screening: Fall Risk  01/20/2019 10/27/2018 10/01/2018  Falls in the past year? 1 0 0  Number falls in past yr: 0 - 0  Injury with Fall? 0 - 0  Comment bruises - -  Risk for fall due to : Impaired balance/gait - Impaired balance/gait;Impaired mobility  Follow up Education provided - Education provided;Falls prevention discussed   PHQ 2/9 Scores 10/01/2018 04/02/2018 01/08/2018 12/16/2017  PHQ - 2 Score 0 0 0 0    Assessment: Patient will continue to benefit from health coach outreach for disease management and support. THN CM Care Plan Problem One     Most Recent Value  THN Long Term Goal   In 30 days the patient will verbalize no admission due to chf exacerbation  THN Long Term Goal Start Date  01/20/19  Interventions for Problem One Long Term Goal  Discussed the importance of weighing daily, reviewed the CHF action plan and signs and symptoms of CHF,encourage the patient to discuss any new symptoms she may be having with her physician, and encouraged medication adherence.       Plan: RN Health Coach will contact patient in the month of July and patient agrees to next outreach.
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
## 2019-01-25 ENCOUNTER — Telehealth: Payer: Self-pay | Admitting: Pharmacist

## 2019-01-25 ENCOUNTER — Ambulatory Visit: Payer: Medicare HMO | Admitting: Neurology

## 2019-01-25 NOTE — Telephone Encounter (Signed)
Feeling "wobbly" lately. Weight is up 2lbs overnight  Denies swelling, dizziness, shortness of breath, or decrease appetite.  6/8: 176/88 (72); 167/87 (75) 6/7: 156/100(74); 125/79   Atenolol 50mg  BID Furosemide 40mg  every Monday and 20mg  evening Irbesartan 150mg  BID Isosorbide 30mg  every morning  Recommendation: -Continue all medication as prescribed -Discuss weakness with PCP during f/u next week

## 2019-01-26 ENCOUNTER — Telehealth: Payer: Self-pay

## 2019-01-26 NOTE — Telephone Encounter (Addendum)
See 6/1 note

## 2019-01-26 NOTE — Telephone Encounter (Signed)

## 2019-01-29 ENCOUNTER — Other Ambulatory Visit: Payer: Self-pay

## 2019-01-29 NOTE — Patient Outreach (Signed)
[facility]) Care Management  01/29/2019  Kristen Whitehead 1923/02/07 [id number]    Telephone Screen  Received a call from the patient.  Two patient identifiers obtained.  The patient states that she tried to call the nurse triage line for [facility] and all she got was insurance questions.  She wanted to ask about her knee that she hurt when she fell two weeks ago.  She went to her physician office and had it assessed.  She states that she was told not to put a bandage on the wound but she did.  She states it has a grayish looking drainage. She has no sense of smell so she is unable to let me know if there is an odor.  No pain or hot to the touch.  It does have a red ring around the wound.  Advised the patient to call her physician office and let them know.  She verbalized understanding and stated that she would.   Plan: RN Health Coach will outreach the patient at the next scheduled interval.     Addendum:   [place] called the patient back to let her know that [phone] is the correct number for the nurse triage if she needs it for future use and maybe she hit an incorrect number.  She stated that she was able to get in touch with the doctors office and the nurse will call her back.Lazaro Arms RN, BSN, [place] Direct Dial:  [phone] Fax: [phone]

## 2019-01-30 DIAGNOSIS — R58 Hemorrhage, not elsewhere classified: Secondary | ICD-10-CM | POA: Diagnosis not present

## 2019-02-01 ENCOUNTER — Other Ambulatory Visit: Payer: Self-pay

## 2019-02-01 ENCOUNTER — Ambulatory Visit (INDEPENDENT_AMBULATORY_CARE_PROVIDER_SITE_OTHER): Payer: Medicare HMO | Admitting: Pharmacist Clinician (PhC)/ Clinical Pharmacy Specialist

## 2019-02-01 DIAGNOSIS — Z5181 Encounter for therapeutic drug level monitoring: Secondary | ICD-10-CM | POA: Diagnosis not present

## 2019-02-01 DIAGNOSIS — I482 Chronic atrial fibrillation, unspecified: Secondary | ICD-10-CM

## 2019-02-01 LAB — POCT INR: INR: 3.3 — AB (ref 2.0–3.0)

## 2019-02-01 NOTE — Patient Instructions (Signed)
No warfarin today Monday June 15, then continue with 5mg  daily except for 2.5 mg tablet every Monday, Wednesday and Friday. Recheck INR in 4 weeks.

## 2019-02-02 ENCOUNTER — Ambulatory Visit: Payer: Self-pay

## 2019-02-03 ENCOUNTER — Telehealth: Payer: Self-pay | Admitting: Adult Health

## 2019-02-03 NOTE — Telephone Encounter (Signed)
Mychart, no smartphone, consent, pre reg complete 02/03/19 AF
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
## 2019-02-08 ENCOUNTER — Telehealth: Payer: Self-pay

## 2019-02-08 ENCOUNTER — Telehealth (INDEPENDENT_AMBULATORY_CARE_PROVIDER_SITE_OTHER): Payer: Medicare HMO | Admitting: Adult Health

## 2019-02-08 ENCOUNTER — Encounter: Payer: Self-pay | Admitting: Adult Health

## 2019-02-08 VITALS — BP 198/97 | HR 71 | Ht 59.0 in | Wt 133.2 lb

## 2019-02-08 DIAGNOSIS — I10 Essential (primary) hypertension: Secondary | ICD-10-CM

## 2019-02-08 DIAGNOSIS — I4891 Unspecified atrial fibrillation: Secondary | ICD-10-CM

## 2019-02-08 MED ORDER — IRBESARTAN 150 MG PO TABS: 150.0000 mg | ORAL_TABLET | Freq: Two times a day (BID) | ORAL | 3 refills | Status: DC

## 2019-02-08 MED ORDER — IRBESARTAN 150 MG PO TABS: 150.0000 mg | ORAL_TABLET | Freq: Two times a day (BID) | ORAL | 1 refills | Status: DC

## 2019-02-08 MED ORDER — ISOSORBIDE MONONITRATE ER 30 MG PO TB24: 30.0000 mg | ORAL_TABLET | Freq: Two times a day (BID) | ORAL | 1 refills | Status: DC

## 2019-02-08 NOTE — Patient Instructions (Signed)
Medication Instructions:  TAKE FUROSEMIDE 40MG =AM AND 20MG =PM ISOSORBIDE 30MG  TWICE DAILY If you need a refill on your cardiac medications before your next appointment, please call your pharmacy.  Special Instructions: HOME HEALTH WILL BE CALLING YOU TO SCHEDULE A HOME APPOINTMENT  Follow-Up: You will need a follow up appointment in 3 months.  Please call our office IN July 2020 to schedule this, September 2020 appointment.  You may see [name], MD , Jory Sims, DNP, AACC or one of the following Advanced Practice Providers on your designated Care Team:  Kerin Ransom, PA-C Roby Lofts, PA-C Sande Rives, [place]       At [facility], you and your health needs are our priority.  As part of our continuing mission to provide you with exceptional heart care, we have created designated Provider Care Teams.  These Care Teams include your primary Cardiologist (physician) and Advanced Practice Providers (APPs -  Physician Assistants and Nurse Practitioners) who all work together to provide you with the care you need, when you need it.  Thank you for choosing [facility] at [facility]!!

## 2019-02-08 NOTE — Telephone Encounter (Signed)
[place] Visit Initial Request  Date of Request ([place]):  February 08, 2019  Requesting Provider:  Jory Sims [facility]    Agency Requested:    [facility] Contact:  Joen Laura, [place], [place], [place] [address], [place]  [phone] [email or web address]  Phone #: [phone] Fax #: [phone]  Patient Demographic Information: Name:  Kristen Whitehead Age:  85 y.o.   DOB:  [birth year]  MRN:  [id number]   Address:   [address] [place] [address]   Phone Numbers:   Home Phone [phone]  Mobile [phone]     Emergency Contact Information on File:   Contact Information    Name Relation Home Work Mobile   [place] Son [phone]    Guido Sander Daughter [phone]     Karie Chimera [phone]        The above family members may be contacted for information on this patient (review DPR on file):  Yes    Patient Clinical Information:  Primary Care Provider:  Leighton Ruff, MD  Primary Cardiologist:  [name], MD  Primary Electrophysiologist:  None   Past Medical Hx: Ms. Glassberg  has a past medical history of Bladder prolapse, female, acquired, Cancer [facility]), Chest pain, Chronic anticoagulation, Chronic atrial fibrillation, Chronic diastolic heart failure ([place]), GERD (gastroesophageal reflux disease), Heart disease, History of congenital mitral regurgitation, History of mitral valve prolapse (06/15/2008), Hypercholesterolemia, Hypertension, and Hypothyroidism.   Allergies: She is allergic to azithromycin; compazine [prochlorperazine edisylate]; demerol; dolophine [methadone]; ebastine; erythromycin; ibuprofen; statins; tetanus toxoids; and tetracyclines & related.   Medications: Current Outpatient Medications on File Prior to Visit  Medication Sig  . atenolol (TENORMIN) 50 MG tablet Take 1 tablet (50 mg total) by mouth 2 (two) times daily.  .  benzonatate (TESSALON) 100 MG capsule Take 100 mg by mouth 3 (three) times daily as needed for cough.  . Calcium Carbonate-Vitamin D (CALCIUM 600+D PO) Take 1 tablet by mouth 2 (two) times daily.  . Cholecalciferol (VITAMIN D) 2000 UNITS tablet Take 4,000 Units by mouth daily.   . cycloSPORINE (RESTASIS) 0.05 % ophthalmic emulsion Place 1 drop into both eyes 2 (two) times daily.  . dorzolamide (TRUSOPT) 2 % ophthalmic solution Place 1 drop into the left eye 2 (two) times daily.  . famotidine (PEPCID) 20 MG tablet Take 20 mg by mouth daily.  . furosemide (LASIX) 40 MG tablet Take 40 mg by mouth. Take 40mg  in AM and 20mg  in evening  . guaiFENesin (ROBITUSSIN) 100 MG/5ML liquid Take 5-10 mLs (100-200 mg total) by mouth every 4 (four) hours as needed for cough.  . irbesartan (AVAPRO) 150 MG tablet Take 1 tablet (150 mg total) by mouth 2 (two) times a day.  . isosorbide mononitrate (IMDUR) 30 MG 24 hr tablet Take 1 tablet (30 mg total) by mouth 2 (two) times a day.  . latanoprost (XALATAN) 0.005 % ophthalmic solution Place 1 drop into both eyes at bedtime.   Marland Kitchen levothyroxine (SYNTHROID, LEVOTHROID) 100 MCG tablet Take 100 mcg by mouth daily.   Marland Kitchen MAGNESIUM OXIDE 400 PO Take 800 mg by mouth daily.  . Multiple Vitamins-Minerals (CENTRUM SILVER PO) Take 1 tablet by mouth daily.  . Multiple Vitamins-Minerals (PRESERVISION AREDS 2 PO) Take 1 capsule by mouth 2 (two) times daily.   . nitroGLYCERIN (NITROSTAT) 0.4 MG SL tablet DISSOLVE 1 TABLET UNDER THE TONGUE EVERY 5 MINUTES FOR 3 DOSES AS NEEDED  FOR  CHEST  PAIN  . potassium chloride (K-DUR) 10 MEQ tablet Take 1 tablet (10 mEq total) by mouth daily.  . pramipexole (MIRAPEX) 0.125 MG tablet Take 1 tablet (0.125 mg total) by mouth every evening.  Marland Kitchen PREMARIN vaginal cream Place 2 g vaginally 2 (two) times a week. Uses on Sunday and wednesday  . Probiotic Product (PROBIOTIC DAILY PO) Take 1 tablet by mouth daily.  . temazepam (RESTORIL) 15 MG capsule Take 15  mg by mouth at bedtime.  Marland Kitchen warfarin (COUMADIN) 5 MG tablet TAKE 1/2 TO 1 TABLET DAILY AS DIRECTED BY COUMADIN CLINIC (Patient taking differently: Take 1/2 tab Mon,Wed, Friday and 1 tab on the other days as directed by coumadin clinic)   No current facility-administered medications on file prior to visit.      Social Hx: She  reports that she has never smoked. She has never used smokeless tobacco. She reports that she does not drink alcohol or use drugs.    Diagnosis/Reason for Visit:   Confusion with medications and HTN  Services Requested:  Medication Reconciliation   Vital Signs (BP, Pulse, O2, Weight)   # of Visits Needed/Frequency per Week: 2-3 WEEK/6 WEEKS  A copy of the office note will be faxed with this form.

## 2019-02-09 ENCOUNTER — Telehealth: Payer: Self-pay | Admitting: Adult Health

## 2019-02-09 NOTE — Telephone Encounter (Signed)
Encounter not needed

## 2019-02-10 ENCOUNTER — Telehealth: Payer: Self-pay | Admitting: Adult Health

## 2019-02-10 ENCOUNTER — Other Ambulatory Visit: Payer: Self-pay | Admitting: Cardiovascular Disease

## 2019-02-10 MED ORDER — IRBESARTAN 150 MG PO TABS: 150.0000 mg | ORAL_TABLET | Freq: Two times a day (BID) | ORAL | 2 refills | Status: DC

## 2019-02-10 NOTE — Telephone Encounter (Signed)
New Message     Tiffany from [facility] is calling and says she has a referral for this pt and she is needing additional information     Please call back

## 2019-02-10 NOTE — Telephone Encounter (Signed)
Pt will be assigned to [place] in order to have 2-3 visits a week and not the [place] program .Adonis Housekeeper

## 2019-02-10 NOTE — Telephone Encounter (Signed)
°*  STAT* If patient is at the pharmacy, call can be transferred to refill team.   1. Which medications need to be refilled? (please list name of each medication and dose if known)  irbesartan (AVAPRO) 150 MG tablet  2. Which pharmacy/location (including street and city if local pharmacy) is medication to be sent to?  [place], [place]  3. Do they need a 30 day or 90 day supply? 90   Patient states that the dosage instructions that [employer] has state the patient takes the medication once daily, but the patient takes the medication 2x daily. The patient is out of the medication, and [employer] will not refill the medication yet.  Patient is having problems with her home phone. Please reach out to her on her cell phone.

## 2019-02-11 ENCOUNTER — Telehealth: Payer: Self-pay | Admitting: Adult Health

## 2019-02-11 NOTE — Telephone Encounter (Signed)
New message  Pt c/o medication issue:  1. Name of Medication: warfarin (COUMADIN) 5 MG tablet  2. How are you currently taking this medication (dosage and times per day)? 1 time daily  3. Are you having a reaction (difficulty breathing--STAT)? No   4. What is your medication issue? Patient want to know if she can go over to Eliquis. Please advise.

## 2019-02-11 NOTE — Telephone Encounter (Signed)
Pt aware that she needs INR check prior to transition to Eliquis and INR check on 7/13 so she will be transitioned to Eliquis during that visit. Pt verbalized understanding

## 2019-02-11 NOTE — Telephone Encounter (Signed)
Spoke with pt who states INR fluctuating and c/o bruising and wanted to see if she can try Eliquis in place of wafarin. She states Erasmo Downer mentioned Eliquis to her during her last anti-coag visit. Informed pt that nurse would send message to pharmD to f/u. She states if she is started on Eliquis she would like this to be sent to [facility].   Pt also states she wants any new medications or medication changes (increase/decrease) to be sent to [facility] at [address] All other Rx refills to be sent to [place], [place].

## 2019-02-11 NOTE — Telephone Encounter (Signed)
New message   Patient has questions about referral start date. Please call to discuss.

## 2019-02-11 NOTE — Telephone Encounter (Signed)
Patient needs INR check before transition to Eliquis. We will transition her to Eliquis during next INR check on 03/01/2019.

## 2019-02-12 ENCOUNTER — Telehealth: Payer: Self-pay | Admitting: Adult Health

## 2019-02-12 MED ORDER — IRBESARTAN 300 MG PO TABS: ORAL_TABLET | ORAL | 50 refills | Status: DC

## 2019-02-12 NOTE — Telephone Encounter (Signed)
I am okay with the PT starting a little later, but important that he participates in this. I hope that they contacted the patent to let them know this as well.   Thx!  Kristen Whitehead

## 2019-02-12 NOTE — Telephone Encounter (Addendum)
Spoke with pharmacy and will change Irbesartan to 300 mg 1/2 tablet twice a day. Advised patient, verbalized understanding

## 2019-02-12 NOTE — Telephone Encounter (Signed)
Called Joycelyn Schmid back from [facility] to discuss questions.  Left call back number.

## 2019-02-12 NOTE — Telephone Encounter (Signed)
New message   Pt c/o medication issue:  1. Name of Medication: isosorbide mononitrate (IMDUR) 30 MG 24 hr tablet  2. How are you currently taking this medication (dosage and times per day)? 1 per day  3. Are you having a reaction (difficulty breathing--STAT)? n/a  4. What is your medication issue? Per [place] exceeds the daily limit for insurance purposes. Please call to discuss.

## 2019-02-12 NOTE — Telephone Encounter (Signed)
Follow up   Kristen Whitehead from [facility] is returning your call. Please call.

## 2019-02-12 NOTE — Telephone Encounter (Signed)
Called and notified Joycelyn Schmid with [facility].  Patients were contacted, and aware of starting later.

## 2019-02-12 NOTE — Telephone Encounter (Signed)
Called back the [facility]- they are going to help patient with Home health, PT and nursing care.  They are unable to help her until July 3rd. They wanted to make sure this was okay, as not to put the patient at risk. Advised that I would send a message to NP but she was out until Monday- and would route to MD covering for Dr..   If we call after 5 or the next two weeks please call office number [phone]; as Joycelyn Schmid is on vacation.

## 2019-02-16 DIAGNOSIS — N8182 Incompetence or weakening of pubocervical tissue: Secondary | ICD-10-CM | POA: Diagnosis not present

## 2019-02-19 DIAGNOSIS — K219 Gastro-esophageal reflux disease without esophagitis: Secondary | ICD-10-CM | POA: Diagnosis not present

## 2019-02-19 DIAGNOSIS — Q233 Congenital mitral insufficiency: Secondary | ICD-10-CM | POA: Diagnosis not present

## 2019-02-19 DIAGNOSIS — Z85850 Personal history of malignant neoplasm of thyroid: Secondary | ICD-10-CM | POA: Diagnosis not present

## 2019-02-19 DIAGNOSIS — E039 Hypothyroidism, unspecified: Secondary | ICD-10-CM | POA: Diagnosis not present

## 2019-02-19 DIAGNOSIS — Z7901 Long term (current) use of anticoagulants: Secondary | ICD-10-CM | POA: Diagnosis not present

## 2019-02-19 DIAGNOSIS — I110 Hypertensive heart disease with heart failure: Secondary | ICD-10-CM | POA: Diagnosis not present

## 2019-02-19 DIAGNOSIS — I482 Chronic atrial fibrillation, unspecified: Secondary | ICD-10-CM | POA: Diagnosis not present

## 2019-02-19 DIAGNOSIS — I5032 Chronic diastolic (congestive) heart failure: Secondary | ICD-10-CM | POA: Diagnosis not present

## 2019-02-22 ENCOUNTER — Telehealth: Payer: Self-pay

## 2019-02-22 ENCOUNTER — Other Ambulatory Visit: Payer: Self-pay

## 2019-02-22 ENCOUNTER — Encounter (INDEPENDENT_AMBULATORY_CARE_PROVIDER_SITE_OTHER): Payer: Medicare HMO | Admitting: Ophthalmology

## 2019-02-22 DIAGNOSIS — H35033 Hypertensive retinopathy, bilateral: Secondary | ICD-10-CM | POA: Diagnosis not present

## 2019-02-22 DIAGNOSIS — I10 Essential (primary) hypertension: Secondary | ICD-10-CM | POA: Diagnosis not present

## 2019-02-22 DIAGNOSIS — H353132 Nonexudative age-related macular degeneration, bilateral, intermediate dry stage: Secondary | ICD-10-CM | POA: Diagnosis not present

## 2019-02-22 DIAGNOSIS — H43813 Vitreous degeneration, bilateral: Secondary | ICD-10-CM

## 2019-02-22 DIAGNOSIS — H3562 Retinal hemorrhage, left eye: Secondary | ICD-10-CM | POA: Diagnosis not present

## 2019-02-22 NOTE — Telephone Encounter (Signed)

## 2019-02-23 DIAGNOSIS — E890 Postprocedural hypothyroidism: Secondary | ICD-10-CM | POA: Diagnosis not present

## 2019-02-23 DIAGNOSIS — Z85850 Personal history of malignant neoplasm of thyroid: Secondary | ICD-10-CM | POA: Diagnosis not present

## 2019-03-01 ENCOUNTER — Ambulatory Visit (INDEPENDENT_AMBULATORY_CARE_PROVIDER_SITE_OTHER): Payer: Medicare HMO | Admitting: *Deleted

## 2019-03-01 ENCOUNTER — Other Ambulatory Visit: Payer: Self-pay

## 2019-03-01 DIAGNOSIS — I482 Chronic atrial fibrillation, unspecified: Secondary | ICD-10-CM | POA: Diagnosis not present

## 2019-03-01 DIAGNOSIS — Z5181 Encounter for therapeutic drug level monitoring: Secondary | ICD-10-CM

## 2019-03-01 LAB — POCT INR: INR: 2.2 (ref 2.0–3.0)

## 2019-03-01 NOTE — Patient Instructions (Signed)
Description   Continue with 5mg  daily except for 2.5 mg tablet every Monday, Wednesday and Friday. Recheck INR in 4 weeks.

## 2019-03-05 ENCOUNTER — Other Ambulatory Visit: Payer: Self-pay

## 2019-03-05 DIAGNOSIS — Q233 Congenital mitral insufficiency: Secondary | ICD-10-CM | POA: Diagnosis not present

## 2019-03-05 DIAGNOSIS — Z85850 Personal history of malignant neoplasm of thyroid: Secondary | ICD-10-CM | POA: Diagnosis not present

## 2019-03-05 DIAGNOSIS — Z7901 Long term (current) use of anticoagulants: Secondary | ICD-10-CM | POA: Diagnosis not present

## 2019-03-05 DIAGNOSIS — K219 Gastro-esophageal reflux disease without esophagitis: Secondary | ICD-10-CM | POA: Diagnosis not present

## 2019-03-05 DIAGNOSIS — I110 Hypertensive heart disease with heart failure: Secondary | ICD-10-CM | POA: Diagnosis not present

## 2019-03-05 DIAGNOSIS — I5032 Chronic diastolic (congestive) heart failure: Secondary | ICD-10-CM | POA: Diagnosis not present

## 2019-03-05 DIAGNOSIS — I482 Chronic atrial fibrillation, unspecified: Secondary | ICD-10-CM | POA: Diagnosis not present

## 2019-03-05 DIAGNOSIS — E039 Hypothyroidism, unspecified: Secondary | ICD-10-CM | POA: Diagnosis not present

## 2019-03-05 NOTE — Patient Outreach (Signed)
[facility]) Care Management  03/05/2019   Kristen Whitehead [birth year] [id number]  Subjective: Successful outreach.  Two patient identifiers obtained.  The patient states that she is doing well. She denies any pain, swelling or falls.  She does have shortness of breath with exertion that is usual for her.  Her weight today was 134 lbs.  She said it is up 2 lbs from her last weight and she is not sure why.  She states that she had a nurse come to see her today from [facility]. She checked her vital and her blood pressure was around 140/98 earlier that morning it was 171/83 and pulse 79.  She said that her blood pressure is still fluctuating.  The nurse is going to see if she can get her pill packs because the patient states it is getting harder for her to fill her pill box. The nurse will also see if she will be able to receive meals on wheels. The patient states that the nurse will be coming to see her for a few weeks.   Current Medications:  Current Outpatient Medications  Medication Sig Dispense Refill  . Calcium Carbonate-Vitamin D (CALCIUM 600+D PO) Take 1 tablet by mouth 2 (two) times daily.    . Cholecalciferol (VITAMIN D) 2000 UNITS tablet Take 4,000 Units by mouth daily.     . cycloSPORINE (RESTASIS) 0.05 % ophthalmic emulsion Place 1 drop into both eyes 2 (two) times daily.    . dorzolamide (TRUSOPT) 2 % ophthalmic solution Place 1 drop into the left eye 2 (two) times daily.    . famotidine (PEPCID) 20 MG tablet Take 20 mg by mouth daily.    . furosemide (LASIX) 40 MG tablet Take 40 mg by mouth. Take 40mg  in AM and 20mg  in evening    . guaiFENesin (ROBITUSSIN) 100 MG/5ML liquid Take 5-10 mLs (100-200 mg total) by mouth every 4 (four) hours as needed for cough. 60 mL 0  . irbesartan (AVAPRO) 300 MG tablet Take 1/2 tablet twice a day 30 tablet 50  . isosorbide mononitrate (IMDUR) 30 MG 24 hr tablet Take 1 tablet (30 mg total) by mouth 2 (two) times a day. 180  tablet 1  . latanoprost (XALATAN) 0.005 % ophthalmic solution Place 1 drop into both eyes at bedtime.     Marland Kitchen levothyroxine (SYNTHROID, LEVOTHROID) 100 MCG tablet Take 100 mcg by mouth daily.     Marland Kitchen MAGNESIUM OXIDE 400 PO Take 800 mg by mouth daily.    . Multiple Vitamins-Minerals (CENTRUM SILVER PO) Take 1 tablet by mouth daily.    . Multiple Vitamins-Minerals (PRESERVISION AREDS 2 PO) Take 1 capsule by mouth 2 (two) times daily.     . nitroGLYCERIN (NITROSTAT) 0.4 MG SL tablet DISSOLVE 1 TABLET UNDER THE TONGUE EVERY 5 MINUTES FOR 3 DOSES AS NEEDED  FOR  CHEST  PAIN 50 tablet 2  . potassium chloride (K-DUR) 10 MEQ tablet Take 1 tablet (10 mEq total) by mouth daily. 90 tablet 1  . pramipexole (MIRAPEX) 0.125 MG tablet Take 1 tablet (0.125 mg total) by mouth every evening. 30 tablet 5  . PREMARIN vaginal cream Place 2 g vaginally 2 (two) times a week. Uses on Sunday and wednesday    . Probiotic Product (PROBIOTIC DAILY PO) Take 1 tablet by mouth daily.    . temazepam (RESTORIL) 15 MG capsule Take 15 mg by mouth at bedtime.    Marland Kitchen warfarin (COUMADIN) 5 MG tablet TAKE 1/2 TO  1 TABLET DAILY AS DIRECTED BY COUMADIN CLINIC (Patient taking differently: Take 1/2 tab Mon,Wed, Friday and 1 tab on the other days as directed by coumadin clinic) 90 tablet 0  . atenolol (TENORMIN) 50 MG tablet Take 1 tablet (50 mg total) by mouth 2 (two) times daily. 180 tablet 3  . benzonatate (TESSALON) 100 MG capsule Take 100 mg by mouth 3 (three) times daily as needed for cough.     No current facility-administered medications for this visit.     Functional Status:  In your present state of health, do you have any difficulty performing the following activities: 10/01/2018 04/02/2018  Hearing? N N  Vision? N Y  Difficulty concentrating or making decisions? N N  Walking or climbing stairs? Y Y  Dressing or bathing? N N  Doing errands, shopping? Y N  Preparing Food and eating ? N -  Using the Toilet? N -  In the past six  months, have you accidently leaked urine? N -  Do you have problems with loss of bowel control? N -  Managing your Medications? Y -  Managing your Finances? N -  Housekeeping or managing your Housekeeping? Y -  Some recent data might be hidden    Fall/Depression Screening: Fall Risk  03/05/2019 01/20/2019 10/27/2018  Falls in the past year? 0 1 0  Number falls in past yr: - 0 -  Injury with Fall? - 0 -  Comment - bruises -  Risk for fall due to : - Impaired balance/gait -  Follow up - Education provided -   [facility] 2/9 Scores 10/01/2018 04/02/2018 01/08/2018 12/16/2017  PHQ - 2 Score 0 0 0 0    Assessment: Patient will continue to benefit from health coach outreach for disease management and support. [facility] CM Care Plan Problem One     Most Recent Value  [facility] Long Term Goal   In 30 days the patient will verbalize no admission due to chf exacerbation  [facility]N Long Term Goal Start Date  03/05/19  Interventions for Problem One Long Term Goal  Reviewed signs and symptoms of CHF, encouraged medication adherenc, and diet     Plan: RN Health Coach will contact patient in the month of August and patient agrees to next outreach.   Lazaro Arms RN, BSN, [place] Direct Dial:  [phone]  Fax: [phone]

## 2019-03-10 DIAGNOSIS — I5032 Chronic diastolic (congestive) heart failure: Secondary | ICD-10-CM | POA: Diagnosis not present

## 2019-03-10 DIAGNOSIS — Z7901 Long term (current) use of anticoagulants: Secondary | ICD-10-CM | POA: Diagnosis not present

## 2019-03-10 DIAGNOSIS — I482 Chronic atrial fibrillation, unspecified: Secondary | ICD-10-CM | POA: Diagnosis not present

## 2019-03-10 DIAGNOSIS — Q233 Congenital mitral insufficiency: Secondary | ICD-10-CM | POA: Diagnosis not present

## 2019-03-10 DIAGNOSIS — E039 Hypothyroidism, unspecified: Secondary | ICD-10-CM | POA: Diagnosis not present

## 2019-03-10 DIAGNOSIS — K219 Gastro-esophageal reflux disease without esophagitis: Secondary | ICD-10-CM | POA: Diagnosis not present

## 2019-03-10 DIAGNOSIS — I110 Hypertensive heart disease with heart failure: Secondary | ICD-10-CM | POA: Diagnosis not present

## 2019-03-10 DIAGNOSIS — Z85850 Personal history of malignant neoplasm of thyroid: Secondary | ICD-10-CM | POA: Diagnosis not present

## 2019-03-18 DIAGNOSIS — N390 Urinary tract infection, site not specified: Secondary | ICD-10-CM | POA: Diagnosis not present

## 2019-03-18 DIAGNOSIS — R319 Hematuria, unspecified: Secondary | ICD-10-CM | POA: Diagnosis not present

## 2019-03-19 DIAGNOSIS — I482 Chronic atrial fibrillation, unspecified: Secondary | ICD-10-CM | POA: Diagnosis not present

## 2019-03-19 DIAGNOSIS — Z85850 Personal history of malignant neoplasm of thyroid: Secondary | ICD-10-CM | POA: Diagnosis not present

## 2019-03-19 DIAGNOSIS — E039 Hypothyroidism, unspecified: Secondary | ICD-10-CM | POA: Diagnosis not present

## 2019-03-19 DIAGNOSIS — K219 Gastro-esophageal reflux disease without esophagitis: Secondary | ICD-10-CM | POA: Diagnosis not present

## 2019-03-19 DIAGNOSIS — I110 Hypertensive heart disease with heart failure: Secondary | ICD-10-CM | POA: Diagnosis not present

## 2019-03-19 DIAGNOSIS — Q233 Congenital mitral insufficiency: Secondary | ICD-10-CM | POA: Diagnosis not present

## 2019-03-19 DIAGNOSIS — Z7901 Long term (current) use of anticoagulants: Secondary | ICD-10-CM | POA: Diagnosis not present

## 2019-03-19 DIAGNOSIS — I5032 Chronic diastolic (congestive) heart failure: Secondary | ICD-10-CM | POA: Diagnosis not present

## 2019-03-23 DIAGNOSIS — Q233 Congenital mitral insufficiency: Secondary | ICD-10-CM | POA: Diagnosis not present

## 2019-03-23 DIAGNOSIS — Z7901 Long term (current) use of anticoagulants: Secondary | ICD-10-CM | POA: Diagnosis not present

## 2019-03-23 DIAGNOSIS — K219 Gastro-esophageal reflux disease without esophagitis: Secondary | ICD-10-CM | POA: Diagnosis not present

## 2019-03-23 DIAGNOSIS — Z85850 Personal history of malignant neoplasm of thyroid: Secondary | ICD-10-CM | POA: Diagnosis not present

## 2019-03-23 DIAGNOSIS — E039 Hypothyroidism, unspecified: Secondary | ICD-10-CM | POA: Diagnosis not present

## 2019-03-23 DIAGNOSIS — I110 Hypertensive heart disease with heart failure: Secondary | ICD-10-CM | POA: Diagnosis not present

## 2019-03-23 DIAGNOSIS — I5032 Chronic diastolic (congestive) heart failure: Secondary | ICD-10-CM | POA: Diagnosis not present

## 2019-03-23 DIAGNOSIS — I482 Chronic atrial fibrillation, unspecified: Secondary | ICD-10-CM | POA: Diagnosis not present

## 2019-03-24 ENCOUNTER — Other Ambulatory Visit: Payer: Self-pay

## 2019-03-24 ENCOUNTER — Ambulatory Visit (INDEPENDENT_AMBULATORY_CARE_PROVIDER_SITE_OTHER): Payer: Medicare HMO | Admitting: Neurology

## 2019-03-24 ENCOUNTER — Encounter: Payer: Self-pay | Admitting: Neurology

## 2019-03-24 VITALS — BP 181/97 | HR 77 | Ht 59.0 in | Wt 135.0 lb

## 2019-03-24 DIAGNOSIS — G2581 Restless legs syndrome: Secondary | ICD-10-CM

## 2019-03-24 DIAGNOSIS — Z9181 History of falling: Secondary | ICD-10-CM | POA: Diagnosis not present

## 2019-03-24 MED ORDER — PRAMIPEXOLE DIHYDROCHLORIDE 0.125 MG PO TABS: 0.1250 mg | ORAL_TABLET | Freq: Every evening | ORAL | 3 refills | Status: DC

## 2019-03-24 NOTE — Progress Notes (Signed)
Subjective:    Patient ID: Kristen Whitehead is a 85 y.o. female.  HPI     Interim history:   Kristen Whitehead is a 85 year old right-handed woman with an underlying medical history of hypertension, hyperlipidemia, atrial fibrillation, celiac disease, scoliosis, reflux disease, who presents for followup consultation of her RLS. She is unaccompanied today. I last saw her on 01/22/2018, at which time she felt that her foot pain and restless leg symptoms were stable on low-dose Mirapex.  She had a recent echocardiogram in May 2019 which showed atrial fibrillation, normal left ventricular size with normal EF.  She had moderate biatrial enlargement and moderate pulmonary hypertension.  She was taking Mirapex at night without side effects reported she had some trouble finding shoes that were comfortable.   Today, 03/24/19 (all dictated new, as well as above notes, some dictation done in note pad or Word, outside of chart, may appear as copied):   She reports doing well, she has practically no symptoms of restless legs or foot pain any longer, continues to take pramipexole at night, 0.125 mg, denies any side effects.  She does report a recent fall, she was outside, walking to a friend's house where the meat outside and sit, keeping a distance.  She had almost reached, she fell backwards, she did not recall hitting her head, she reports that she fell on her right hip.  EMS was called and they checked her out, she was not taken to the hospital, no x-ray were taken, she reports that she did not have any pain and was moving everything well in the neck no soreness and no bruise.  She tries to hydrate well with water.  She has a diuretic and is mindful of the need for water intake.  She has a history of dry mouth and dry eyes.  The patient's allergies, current medications, family history, past medical history, past social history, past surgical history and problem list were reviewed and updated as appropriate.     Previously (copied from previous notes for reference):      I saw her on 01/22/2017, at which time she reported doing well, memory loss stable, reported mostly word finding difficulty, was sleeping well, less cramping, in fact, she felt that her muscle spasms and cramps were completely gone. I suggested she continue with low-dose Mirapex 0.125 mg strength generic at night and follow-up in one year.    I saw her on 07/24/2016, at which time she reported cramping in her legs, more so on the right and particularly at night. She felt she had muscle spasms. She would walk around extensively at night to relieve the muscle spasms. She admitted that she did not always drink enough water. She also had a tendency to skip breakfast. She was advised to continue with low dose pramipexole 0.125 mg each night but advised to drink more water, perhaps Gatorade and even tonic water to see if her muscle spasms and cramping would improve. She was also advised to have a more balanced diet and not to skip any meals.    I saw her on 01/23/2016, at which time she reported doing well. She had no recent pain in her hands or feet and no cramping was reported, no paresthesias. She had a recent cardiology visit in March 2017. She reported some short-term memory issues and word finding issues. She was quite active physically and mentally. I suggested we monitor her memory complaints. She was advised to continue with low-dose Mirapex.   I  saw her on 07/24/2015, at which time she reported feeling stable. She felt that there was tingling in her legs and she had a burning sensation in her feet. She did feel that Mirapex was helpful. She had some intermittent tingling in her fingers. She also reported feeling more forgetful. I suggested we continue with low dose pramipexole 0.125 mg each night.   I saw her on 01/20/2015 at which time she reported feeling stable for the most part. Her burning sensation seemed about the same and her leg  cramping seemed a little less overall. She was tolerating low-dose Mirapex. She saw her cardiologist 2 days prior. She had presented to the emergency room on 11/10/2014 with chest pain and dizziness. She was admitted for workup. Cardiac workup was negative. She has A. Fib, rate controlled. She has stable lower extremity swelling and was reminded by her cardiologist to use compression stockings. She was not drinking enough water. I asked her to continue low-dose Mirapex each night.   I saw her on 07/21/2014, at which time she reported a fall about 3 months prior. She had tripped over something. She had no significant injuries and no sequelae. She was not drinking enough water. Her leg swelling was stable. I suggested she continue with low-dose Mirapex, 0.125 mg once nightly.   I saw her on 12/27/2013, at which time she noted ankle swelling. She had gone into cardiac failure the year before and felt similar to that. Nevertheless, because of her lower extremity swelling I suggested she cut back on Mirapex to 0.125 mg each night and see if the swelling improves. If she had flareup in her restless leg symptoms I suggested she could go back to 0.25 mg nightly.   I saw her on 06/28/2013, at which time I noted on clinical exam that she had mild evidence of neuropathy in the absence of a history of diabetes with normal hemoglobin A1c noted and normal B12 levels. She had EMG and nerve conduction studies that did not show any significant neuropathy. I felt that she had most likely RLS. She had done well with Mirapex. I did not make any changes to her medication regimen and asked her to continue taking Mirapex 0.25 mg each night. She did not have any side effects at the time.   I first met her on 02/17/2013, at which time I suggested EMG and nerve conduction testing and a treatment trial with Mirapex because of possible underlying RLS. She has previously seen a podiatrist who had done a nerve biopsy which she reported  was normal. She has a history of A. fib. She has a history of congestive heart failure and is followed by cardiology. Echocardiogram in January 2014 showed biatrial enlargement and mild mitral regurgitation and an EF of 35-70% with diastolic dysfunction.   For years she has had burning in her feet and started having a crawling sensation in her distal legs in 2014. She has also had cramping in her muscles for years and she was taken off of her statin in 2013 and her cramping eventually became better. She has had pins and needles sensation and it helps to walk around. She has worse symptoms at night or by the end of the day. She remembers having "terrible growing pains" as a child and does not have a FHx of RLS as far as she knows. She had nerve biopsies both legs by her podiatrist, which did not show inflammation. While she has a history of chronic back pain she denied  any radiating back pain to her legs. In fact she described pins and needle and crawling sensation in the distal lower extremities, radiating upwards. She has been taking OTC Mg supplements, which helped her cramps.   Her EMG and nerve conduction testing from 03/03/2013 showed no evidence of peripheral neuropathy. We called her with her test results. Her foot pain and paresthesias improved.  Her Past Medical History Is Significant For: Past Medical History:  Diagnosis Date  . Bladder prolapse, female, acquired   . Cancer ([place])    thyroid  . Chest pain    no known ischemic heart disease; negative Myoview July 2013. EF 75% with no ischemia.   . Chronic anticoagulation   . Chronic atrial fibrillation    managed with rate control and coumadin  . Chronic diastolic heart failure ([place])   . GERD (gastroesophageal reflux disease)   . Heart disease   . History of congenital mitral regurgitation    mild  . History of mitral valve prolapse 06/15/2008   a. echo 1/14: mild LVH, EF 60-65%, mild MR, mild to mod BAE, PASP 35  . Hypercholesterolemia    . Hypertension   . Hypothyroidism     Her Past Surgical History Is Significant For: Past Surgical History:  Procedure Laterality Date  . ABDOMINAL HYSTERECTOMY    . CATARACT EXTRACTION    . CHOLECYSTECTOMY    . Thyroidectomy      Her Family History Is Significant For: Family History  Problem Relation Age of Onset  . Stroke Mother   . Stroke Father   . Heart attack Neg Hx   . Heart disease Neg Hx     Her Social History Is Significant For: Social History   Socioeconomic History  . Marital status: Widowed    Spouse name: Not on file  . Number of children: 3  . Years of education: BA  . Highest education level: Not on file  Occupational History    Employer: RETIRED  Social Needs  . Financial resource strain: Not on file  . Food insecurity    Worry: Not on file    Inability: Not on file  . Transportation needs    Medical: Not on file    Non-medical: Not on file  Tobacco Use  . Smoking status: Never Smoker  . Smokeless tobacco: Never Used  Substance and Sexual Activity  . Alcohol use: No  . Drug use: No  . Sexual activity: Never  Lifestyle  . Physical activity    Days per week: Not on file    Minutes per session: Not on file  . Stress: Not on file  Relationships  . Social Herbalist on phone: Not on file    Gets together: Not on file    Attends religious service: Not on file    Active member of club or organization: Not on file    Attends meetings of clubs or organizations: Not on file    Relationship status: Not on file  Other Topics Concern  . Not on file  Social History Narrative   Pt lives at home alone.   Caffeine Use: quit 34yr ago    Her Allergies Are:  Allergies  Allergen Reactions  . Azithromycin Nausea Only  . Compazine [Prochlorperazine Edisylate] Nausea Only  . Demerol Nausea Only  . Dolophine [Methadone] Nausea Only  . Ebastine Nausea Only    EBS  . Erythromycin Nausea Only  . Ibuprofen Nausea Only  . Statins Other  (  See Comments)    myalgias  . Tetanus Toxoids Nausea Only  . Tetracyclines & Related Nausea Only  :   Her Current Medications Are:  Outpatient Encounter Medications as of 03/24/2019  Medication Sig  . Calcium Carbonate-Vitamin D (CALCIUM 600+D PO) Take 1 tablet by mouth 2 (two) times daily.  . Cholecalciferol (VITAMIN D) 2000 UNITS tablet Take 4,000 Units by mouth daily.   . dorzolamide (TRUSOPT) 2 % ophthalmic solution Place 1 drop into the left eye 2 (two) times daily.  . furosemide (LASIX) 40 MG tablet Take 40 mg by mouth. Take 31m in AM and 256min evening  . irbesartan (AVAPRO) 300 MG tablet Take 1/2 tablet twice a day  . isosorbide mononitrate (IMDUR) 30 MG 24 hr tablet Take 1 tablet (30 mg total) by mouth 2 (two) times a day.  . latanoprost (XALATAN) 0.005 % ophthalmic solution Place 1 drop into both eyes at bedtime.   . Marland Kitchenevothyroxine (SYNTHROID, LEVOTHROID) 100 MCG tablet Take 100 mcg by mouth daily.   . Marland KitchenAGNESIUM OXIDE 400 PO Take 800 mg by mouth daily.  . Multiple Vitamins-Minerals (CENTRUM SILVER PO) Take 1 tablet by mouth daily.  . Multiple Vitamins-Minerals (PRESERVISION AREDS 2 PO) Take 1 capsule by mouth 2 (two) times daily.   . nitroGLYCERIN (NITROSTAT) 0.4 MG SL tablet DISSOLVE 1 TABLET UNDER THE TONGUE EVERY 5 MINUTES FOR 3 DOSES AS NEEDED  FOR  CHEST  PAIN  . potassium chloride (K-DUR) 10 MEQ tablet Take 1 tablet (10 mEq total) by mouth daily.  . pramipexole (MIRAPEX) 0.125 MG tablet Take 1 tablet (0.125 mg total) by mouth every evening.  . Marland KitchenREMARIN vaginal cream Place 2 g vaginally once a week. Uses on Sunday and wednesday  . Probiotic Product (PROBIOTIC DAILY PO) Take 1 tablet by mouth daily.  . temazepam (RESTORIL) 15 MG capsule Take 15 mg by mouth at bedtime.  . Marland Kitchenarfarin (COUMADIN) 5 MG tablet TAKE 1/2 TO 1 TABLET DAILY AS DIRECTED BY COUMADIN CLINIC (Patient taking differently: Take 1/2 tab Mon,Wed, Friday and 1 tab on the other days as directed by coumadin clinic)   . atenolol (TENORMIN) 50 MG tablet Take 1 tablet (50 mg total) by mouth 2 (two) times daily.  . [DISCONTINUED] benzonatate (TESSALON) 100 MG capsule Take 100 mg by mouth 3 (three) times daily as needed for cough.  . [DISCONTINUED] cycloSPORINE (RESTASIS) 0.05 % ophthalmic emulsion Place 1 drop into both eyes 2 (two) times daily.  . [DISCONTINUED] famotidine (PEPCID) 20 MG tablet Take 20 mg by mouth daily.  . [DISCONTINUED] guaiFENesin (ROBITUSSIN) 100 MG/5ML liquid Take 5-10 mLs (100-200 mg total) by mouth every 4 (four) hours as needed for cough.   No facility-administered encounter medications on file as of 03/24/2019.   :  Review of Systems:  Out of a complete 14 point review of systems, all are reviewed and negative with the exception of these symptoms as listed below: Review of Systems  Neurological:       Pt presents today to follow up on her mirapex. Pt reports that it is going well.    Objective:  Neurological Exam  Physical Exam Physical Examination:   Vitals:   03/24/19 1137  BP: (!) 181/97  Pulse: 77    General Examination: The patient is a very pleasant [age].o. female in no acute distress. She appears well-developed and well-nourished and well groomed.   HEENT:Normocephalic, atraumatic, pupils are equal, round and reactive to light and accommodation. She is s/p bilateral  cataract repairs. Extraocular tracking is good without limitation to gaze excursion or nystagmus noted. Normal smooth pursuit is noted. Hearing is grossly intact. Face is symmetric with normal facial animation and normal facial sensation. Speech is clear with no dysarthria noted. There is no hypophonia. There is no lip, neck/head, jaw or voice tremor. Neck with FROM. Oropharynx exam reveals: mild to moderate mouth dryness, adequate dental hygiene and no significant airway crowding. Tongue protrudes centrally and palate elevates symmetrically.   Chest:Clear to auscultation without wheezing, rhonchi or  crackles noted.  Heart:S1+S2+0, irregularly irregular, no murmur noted.   Abdomen:Soft, non-tender and non-distended with normal bowel sounds appreciated on auscultation.  Extremities:There is no edema. She wears compression stockings.  Skin: Warm and dry without trophic changes noted.   Musculoskeletal: exam reveals no obvious joint deformities, tenderness or joint swelling or erythema.   Neurologically:  Mental status: The patient is awake, alert and oriented in all 4 spheres. Her memory, attention, language and knowledge are appropriate. There is no aphasia, agnosia, apraxia or anomia. Speech is clear with normal prosody and enunciation. Thought process is linear. Mood is congruent and affect is normal.  Cranial nerves are as described above under HEENT exam. In addition, shoulder shrug is normal with equal shoulder height noted.  Motor exam: Normal bulk, strength and tone is noted. There is no tremor or rebound. Reflexes are 1+ in the UEs and trace in the knees and absent in both ankles. Fine motor skills are intactfor age globally.  Cerebellar testing shows no dysmetria or intention tremor.  Sensory exam is stable, mildly decreased to all modalities in the distal LEs.  Gait, station and balance: she stands with no significant difficulty. No veering to one side is noted. No leaning to one side is noted. Posture is age-appropriate and stance is slightly wide-based. She walks cautiously. No cane or walker.   Assessment and Plan:   In summary, ZADA HASER is a very pleasant 85 year old female with an underlying medical history of hypertension, hyperlipidemia, atrial fibrillation, celiac disease, scoliosis, reflux disease, whopresents for follow-up consultation of herrestless leg symptoms. She has a history c/w neuropathy, but had a neg EMG/NCV in 2014. Clinically she has remained stable, In fact, she reports that she is currently without symptoms and has been able to  tolerate low-dose pramipexole 0.125 mg each night.  She reports no side effects.  We talked about the importance of fall prevention.  She had a fall last month thankfully without major injuries, she did not seek any medical attention in the hospital or ER at the time or urgent care but was checked out by EMS she reports.  She had no residual pain or bruising to the right hip.  Exam today is good.  She has no limp.  She is advised to continue with pramipexole once daily at night at the low dose and I renewed her prescription today.  She can follow-up routinely in 1 year, sooner if needed.  I answered all her questions today and she was in agreement. I spent 25 minutes in total face-to-face time with the patient, more than 50% of which was spent in counseling and coordination of care, reviewing test results, reviewing medication and discussing or reviewing the diagnosis of RLS, fall prevention, the prognosis and treatment options. Pertinent laboratory and imaging test results that were available during this visit with the patient were reviewed by me and considered in my medical decision making (see chart for details).

## 2019-03-24 NOTE — Patient Instructions (Signed)
I recommend we continue with low-dose pramipexole 0.125 mg each night.  Thankfully, you have done well and have not experienced any side effects, I will renew your prescription. We can follow-up in 1 year routinely.

## 2019-03-29 ENCOUNTER — Ambulatory Visit (INDEPENDENT_AMBULATORY_CARE_PROVIDER_SITE_OTHER): Payer: Medicare HMO | Admitting: Pharmacist

## 2019-03-29 ENCOUNTER — Other Ambulatory Visit: Payer: Self-pay

## 2019-03-29 DIAGNOSIS — Z5181 Encounter for therapeutic drug level monitoring: Secondary | ICD-10-CM

## 2019-03-29 DIAGNOSIS — I482 Chronic atrial fibrillation, unspecified: Secondary | ICD-10-CM

## 2019-03-29 LAB — POCT INR: INR: 4.8 — AB (ref 2.0–3.0)

## 2019-03-29 NOTE — Patient Instructions (Signed)
NO coumadin today or tomorrow, then continue with 5mg  daily except for 2.5 mg tablet every Monday, Wednesday and Friday. Recheck INR in 2 weeks.

## 2019-04-06 DIAGNOSIS — Z85850 Personal history of malignant neoplasm of thyroid: Secondary | ICD-10-CM | POA: Diagnosis not present

## 2019-04-06 DIAGNOSIS — K219 Gastro-esophageal reflux disease without esophagitis: Secondary | ICD-10-CM | POA: Diagnosis not present

## 2019-04-06 DIAGNOSIS — E039 Hypothyroidism, unspecified: Secondary | ICD-10-CM | POA: Diagnosis not present

## 2019-04-06 DIAGNOSIS — I110 Hypertensive heart disease with heart failure: Secondary | ICD-10-CM | POA: Diagnosis not present

## 2019-04-06 DIAGNOSIS — Z7901 Long term (current) use of anticoagulants: Secondary | ICD-10-CM | POA: Diagnosis not present

## 2019-04-06 DIAGNOSIS — I5032 Chronic diastolic (congestive) heart failure: Secondary | ICD-10-CM | POA: Diagnosis not present

## 2019-04-06 DIAGNOSIS — I482 Chronic atrial fibrillation, unspecified: Secondary | ICD-10-CM | POA: Diagnosis not present

## 2019-04-06 DIAGNOSIS — Q233 Congenital mitral insufficiency: Secondary | ICD-10-CM | POA: Diagnosis not present

## 2019-04-08 ENCOUNTER — Other Ambulatory Visit: Payer: Self-pay

## 2019-04-08 NOTE — Patient Outreach (Signed)
[facility]) Care Management  04/08/2019   Kristen Whitehead [birth year] [id number]  Subjective: Successful outreach to the patient.  Two patient identifiers given. She states that she is doing ok.  Denies any pain.  She does have shortness of breath if she exerts herself, and she feels weak in her hands. She admits to having a fall in the beginning of the month.  She was going outside to meet some friends to talk using social distancing and while walking fell backwards. EMS was called and she had no injuries.  Advised the patient to use her rollator when she leaves the home.  She verbalized understanding.  She states that her physician is looking into someone coming to the home to help her with exercises for her imbalance.  She states a nurse comes out every two weeks to fill her pillbox.  She is taking her medication as she should.  She had been having problems with her blood pressure but she feels they have found the right  combination of meds.  She states her blood pressure is coming down.  She did not check her blood pressure today or weigh because she stayed up late last night and got a late start this morning.  She reports that she has her treadmill back and the cardiologist would like for her to get on and walk for ten minutes.  She got on yesterday and walked for nine minutes and stopped because she started to feel funny in her chest.  Advised her that she is doing the right thing and if it continues to let her cardiologist know.   Current Medications:  Current Outpatient Medications  Medication Sig Dispense Refill  . Calcium Carbonate-Vitamin D (CALCIUM 600+D PO) Take 1 tablet by mouth 2 (two) times daily.    . Cholecalciferol (VITAMIN D) 2000 UNITS tablet Take 4,000 Units by mouth daily.     . dorzolamide (TRUSOPT) 2 % ophthalmic solution Place 1 drop into the left eye 2 (two) times daily.    . furosemide (LASIX) 40 MG tablet Take 40 mg by mouth. Take 40mg  in AM and 20mg   in evening    . irbesartan (AVAPRO) 300 MG tablet Take 1/2 tablet twice a day 30 tablet 50  . isosorbide mononitrate (IMDUR) 30 MG 24 hr tablet Take 1 tablet (30 mg total) by mouth 2 (two) times a day. 180 tablet 1  . latanoprost (XALATAN) 0.005 % ophthalmic solution Place 1 drop into both eyes at bedtime.     Marland Kitchen levothyroxine (SYNTHROID, LEVOTHROID) 100 MCG tablet Take 100 mcg by mouth daily.     Marland Kitchen MAGNESIUM OXIDE 400 PO Take 800 mg by mouth daily.    . Multiple Vitamins-Minerals (CENTRUM SILVER PO) Take 1 tablet by mouth daily.    . Multiple Vitamins-Minerals (PRESERVISION AREDS 2 PO) Take 1 capsule by mouth 2 (two) times daily.     . nitroGLYCERIN (NITROSTAT) 0.4 MG SL tablet DISSOLVE 1 TABLET UNDER THE TONGUE EVERY 5 MINUTES FOR 3 DOSES AS NEEDED  FOR  CHEST  PAIN 50 tablet 2  . potassium chloride (K-DUR) 10 MEQ tablet Take 1 tablet (10 mEq total) by mouth daily. 90 tablet 1  . pramipexole (MIRAPEX) 0.125 MG tablet Take 1 tablet (0.125 mg total) by mouth every evening. 90 tablet 3  . PREMARIN vaginal cream Place 2 g vaginally once a week. Uses on Sunday and wednesday    . Probiotic Product (PROBIOTIC DAILY PO) Take 1 tablet by mouth daily.    Marland Kitchen  temazepam (RESTORIL) 15 MG capsule Take 15 mg by mouth at bedtime.    Marland Kitchen warfarin (COUMADIN) 5 MG tablet TAKE 1/2 TO 1 TABLET DAILY AS DIRECTED BY COUMADIN CLINIC (Patient taking differently: Take 1/2 tab Mon,Wed, Friday and 1 tab on the other days as directed by coumadin clinic) 90 tablet 0  . atenolol (TENORMIN) 50 MG tablet Take 1 tablet (50 mg total) by mouth 2 (two) times daily. 180 tablet 3   No current facility-administered medications for this visit.     Functional Status:  In your present state of health, do you have any difficulty performing the following activities: 10/01/2018  Hearing? N  Vision? N  Difficulty concentrating or making decisions? N  Walking or climbing stairs? Y  Dressing or bathing? N  Doing errands, shopping? Y   Preparing Food and eating ? N  Using the Toilet? N  In the past six months, have you accidently leaked urine? N  Do you have problems with loss of bowel control? N  Managing your Medications? Y  Managing your Finances? N  Housekeeping or managing your Housekeeping? Y  Some recent data might be hidden    Fall/Depression Screening: Fall Risk  04/08/2019 03/05/2019 01/20/2019  Falls in the past year? 1 0 1  Number falls in past yr: 0 - 0  Injury with Fall? 0 - 0  Comment - - bruises  Risk for fall due to : Impaired balance/gait - Impaired balance/gait  Follow up Falls evaluation completed;Education provided - Education provided   [facility] 2/9 Scores 10/01/2018 04/02/2018 01/08/2018 12/16/2017  PHQ - 2 Score 0 0 0 0    Assessment: Patient will continue to benefit from health coach outreach for disease management and support. [facility] CM Care Plan Problem One     Most Recent Value  [facility] Long Term Goal   In 30 days the patient will verbalize no admission due to chf exacerbation  [facility] Long Term Goal Start Date  04/08/19  Interventions for Problem One Long Term Goal  Conversed on the signs and symptoms of CHF, Encouraged medication adherence, encouraged the patient to continue to walk on her treadmill for 10 min per cardiology,  discussed fall precautions and encouraged her to use her walker when going out of the home.       Plan: RN Health Coach will contact patient in the month of September and patient agrees to next outreach.    Lazaro Arms RN, BSN, [place] Direct Dial:  [phone]  Fax: [phone]

## 2019-04-12 ENCOUNTER — Other Ambulatory Visit: Payer: Self-pay

## 2019-04-12 ENCOUNTER — Ambulatory Visit (INDEPENDENT_AMBULATORY_CARE_PROVIDER_SITE_OTHER): Payer: Medicare HMO | Admitting: Pharmacist Clinician (PhC)/ Clinical Pharmacy Specialist

## 2019-04-12 DIAGNOSIS — Z5181 Encounter for therapeutic drug level monitoring: Secondary | ICD-10-CM

## 2019-04-12 DIAGNOSIS — I482 Chronic atrial fibrillation, unspecified: Secondary | ICD-10-CM

## 2019-04-12 DIAGNOSIS — N390 Urinary tract infection, site not specified: Secondary | ICD-10-CM | POA: Diagnosis not present

## 2019-04-12 DIAGNOSIS — R319 Hematuria, unspecified: Secondary | ICD-10-CM | POA: Diagnosis not present

## 2019-04-12 DIAGNOSIS — N8182 Incompetence or weakening of pubocervical tissue: Secondary | ICD-10-CM | POA: Diagnosis not present

## 2019-04-12 LAB — POCT INR: INR: 3.2 — AB (ref 2.0–3.0)

## 2019-04-13 DIAGNOSIS — K219 Gastro-esophageal reflux disease without esophagitis: Secondary | ICD-10-CM | POA: Diagnosis not present

## 2019-04-13 DIAGNOSIS — I482 Chronic atrial fibrillation, unspecified: Secondary | ICD-10-CM | POA: Diagnosis not present

## 2019-04-13 DIAGNOSIS — Z7901 Long term (current) use of anticoagulants: Secondary | ICD-10-CM | POA: Diagnosis not present

## 2019-04-13 DIAGNOSIS — I5032 Chronic diastolic (congestive) heart failure: Secondary | ICD-10-CM | POA: Diagnosis not present

## 2019-04-13 DIAGNOSIS — Z85850 Personal history of malignant neoplasm of thyroid: Secondary | ICD-10-CM | POA: Diagnosis not present

## 2019-04-13 DIAGNOSIS — I110 Hypertensive heart disease with heart failure: Secondary | ICD-10-CM | POA: Diagnosis not present

## 2019-04-13 DIAGNOSIS — E039 Hypothyroidism, unspecified: Secondary | ICD-10-CM | POA: Diagnosis not present

## 2019-04-13 DIAGNOSIS — Q233 Congenital mitral insufficiency: Secondary | ICD-10-CM | POA: Diagnosis not present

## 2019-04-15 DIAGNOSIS — H18421 Band keratopathy, right eye: Secondary | ICD-10-CM | POA: Diagnosis not present

## 2019-04-15 DIAGNOSIS — H35342 Macular cyst, hole, or pseudohole, left eye: Secondary | ICD-10-CM | POA: Diagnosis not present

## 2019-04-15 DIAGNOSIS — H401131 Primary open-angle glaucoma, bilateral, mild stage: Secondary | ICD-10-CM | POA: Diagnosis not present

## 2019-04-15 DIAGNOSIS — H04123 Dry eye syndrome of bilateral lacrimal glands: Secondary | ICD-10-CM | POA: Diagnosis not present

## 2019-04-16 DIAGNOSIS — M79601 Pain in right arm: Secondary | ICD-10-CM | POA: Diagnosis not present

## 2019-04-16 DIAGNOSIS — Z23 Encounter for immunization: Secondary | ICD-10-CM | POA: Diagnosis not present

## 2019-04-16 DIAGNOSIS — R2681 Unsteadiness on feet: Secondary | ICD-10-CM | POA: Diagnosis not present

## 2019-04-19 DIAGNOSIS — I110 Hypertensive heart disease with heart failure: Secondary | ICD-10-CM | POA: Diagnosis not present

## 2019-04-19 DIAGNOSIS — Z85850 Personal history of malignant neoplasm of thyroid: Secondary | ICD-10-CM | POA: Diagnosis not present

## 2019-04-19 DIAGNOSIS — E785 Hyperlipidemia, unspecified: Secondary | ICD-10-CM | POA: Diagnosis not present

## 2019-04-19 DIAGNOSIS — Z7901 Long term (current) use of anticoagulants: Secondary | ICD-10-CM | POA: Diagnosis not present

## 2019-04-19 DIAGNOSIS — M8580 Other specified disorders of bone density and structure, unspecified site: Secondary | ICD-10-CM | POA: Diagnosis not present

## 2019-04-19 DIAGNOSIS — H4089 Other specified glaucoma: Secondary | ICD-10-CM | POA: Diagnosis not present

## 2019-04-19 DIAGNOSIS — K219 Gastro-esophageal reflux disease without esophagitis: Secondary | ICD-10-CM | POA: Diagnosis not present

## 2019-04-19 DIAGNOSIS — I482 Chronic atrial fibrillation, unspecified: Secondary | ICD-10-CM | POA: Diagnosis not present

## 2019-04-19 DIAGNOSIS — I10 Essential (primary) hypertension: Secondary | ICD-10-CM | POA: Diagnosis not present

## 2019-04-19 DIAGNOSIS — M810 Age-related osteoporosis without current pathological fracture: Secondary | ICD-10-CM | POA: Diagnosis not present

## 2019-04-19 DIAGNOSIS — E890 Postprocedural hypothyroidism: Secondary | ICD-10-CM | POA: Diagnosis not present

## 2019-04-19 DIAGNOSIS — E039 Hypothyroidism, unspecified: Secondary | ICD-10-CM | POA: Diagnosis not present

## 2019-04-19 DIAGNOSIS — I5032 Chronic diastolic (congestive) heart failure: Secondary | ICD-10-CM | POA: Diagnosis not present

## 2019-04-19 DIAGNOSIS — Q233 Congenital mitral insufficiency: Secondary | ICD-10-CM | POA: Diagnosis not present

## 2019-04-20 DIAGNOSIS — I509 Heart failure, unspecified: Secondary | ICD-10-CM | POA: Diagnosis not present

## 2019-04-20 DIAGNOSIS — Z7901 Long term (current) use of anticoagulants: Secondary | ICD-10-CM | POA: Diagnosis not present

## 2019-04-20 DIAGNOSIS — K219 Gastro-esophageal reflux disease without esophagitis: Secondary | ICD-10-CM | POA: Diagnosis not present

## 2019-04-20 DIAGNOSIS — I482 Chronic atrial fibrillation, unspecified: Secondary | ICD-10-CM | POA: Diagnosis not present

## 2019-04-20 DIAGNOSIS — Z85850 Personal history of malignant neoplasm of thyroid: Secondary | ICD-10-CM | POA: Diagnosis not present

## 2019-04-20 DIAGNOSIS — E039 Hypothyroidism, unspecified: Secondary | ICD-10-CM | POA: Diagnosis not present

## 2019-04-20 DIAGNOSIS — Q233 Congenital mitral insufficiency: Secondary | ICD-10-CM | POA: Diagnosis not present

## 2019-04-20 DIAGNOSIS — I110 Hypertensive heart disease with heart failure: Secondary | ICD-10-CM | POA: Diagnosis not present

## 2019-04-20 DIAGNOSIS — I5032 Chronic diastolic (congestive) heart failure: Secondary | ICD-10-CM | POA: Diagnosis not present

## 2019-04-22 ENCOUNTER — Telehealth: Payer: Self-pay | Admitting: Cardiovascular Disease

## 2019-04-22 NOTE — Telephone Encounter (Signed)
New Message     Patient states she has a bump under Left elbow which is red and hot.  Also fingers are a little blue would like a nurse to call to discuss.

## 2019-04-22 NOTE — Telephone Encounter (Signed)
Spoke with pt and noted yesterday a knot (rock size ) on left elbow that is red and hot to touch as well as hands and feet are blue ,bruising noted as well to left hip .Per pt feet are normally hot but today are cold and hands are normally cold and are hot today Encouraged pt to contact PCP to see about getting seen today and will forward to Dr Oval Linsey for review and recommendations [id number] and HR 61 this am before meds

## 2019-04-23 ENCOUNTER — Other Ambulatory Visit: Payer: Self-pay

## 2019-04-23 ENCOUNTER — Ambulatory Visit (INDEPENDENT_AMBULATORY_CARE_PROVIDER_SITE_OTHER): Payer: Self-pay | Admitting: Pharmacist Clinician (PhC)/ Clinical Pharmacy Specialist

## 2019-04-23 DIAGNOSIS — Z7901 Long term (current) use of anticoagulants: Secondary | ICD-10-CM | POA: Diagnosis not present

## 2019-04-23 DIAGNOSIS — L03114 Cellulitis of left upper limb: Secondary | ICD-10-CM | POA: Diagnosis not present

## 2019-04-23 DIAGNOSIS — M25562 Pain in left knee: Secondary | ICD-10-CM | POA: Diagnosis not present

## 2019-04-23 DIAGNOSIS — I4891 Unspecified atrial fibrillation: Secondary | ICD-10-CM

## 2019-04-23 DIAGNOSIS — Z5181 Encounter for therapeutic drug level monitoring: Secondary | ICD-10-CM

## 2019-04-23 LAB — POCT INR: INR: 4.6 — AB (ref 2.0–3.0)

## 2019-04-23 MED ORDER — IRBESARTAN 300 MG PO TABS: ORAL_TABLET | ORAL | 0 refills | Status: DC

## 2019-04-23 NOTE — Progress Notes (Signed)
Closed in error, will send to DOD for review

## 2019-04-26 NOTE — Telephone Encounter (Signed)
Add amlodipine 2.5 mg daily.  See PCP about her elbow.  Pharm.D. or APP follow-up in 1 month for blood pressure.

## 2019-04-27 DIAGNOSIS — Q233 Congenital mitral insufficiency: Secondary | ICD-10-CM | POA: Diagnosis not present

## 2019-04-27 DIAGNOSIS — Z7901 Long term (current) use of anticoagulants: Secondary | ICD-10-CM | POA: Diagnosis not present

## 2019-04-27 DIAGNOSIS — K219 Gastro-esophageal reflux disease without esophagitis: Secondary | ICD-10-CM | POA: Diagnosis not present

## 2019-04-27 DIAGNOSIS — I482 Chronic atrial fibrillation, unspecified: Secondary | ICD-10-CM | POA: Diagnosis not present

## 2019-04-27 DIAGNOSIS — I509 Heart failure, unspecified: Secondary | ICD-10-CM | POA: Diagnosis not present

## 2019-04-27 DIAGNOSIS — I5032 Chronic diastolic (congestive) heart failure: Secondary | ICD-10-CM | POA: Diagnosis not present

## 2019-04-27 DIAGNOSIS — I110 Hypertensive heart disease with heart failure: Secondary | ICD-10-CM | POA: Diagnosis not present

## 2019-04-27 DIAGNOSIS — Z85850 Personal history of malignant neoplasm of thyroid: Secondary | ICD-10-CM | POA: Diagnosis not present

## 2019-04-27 DIAGNOSIS — E039 Hypothyroidism, unspecified: Secondary | ICD-10-CM | POA: Diagnosis not present

## 2019-04-28 ENCOUNTER — Other Ambulatory Visit: Payer: Self-pay

## 2019-04-28 ENCOUNTER — Ambulatory Visit (INDEPENDENT_AMBULATORY_CARE_PROVIDER_SITE_OTHER): Payer: Medicare HMO | Admitting: Pharmacist

## 2019-04-28 ENCOUNTER — Other Ambulatory Visit: Payer: Self-pay | Admitting: Pharmacist

## 2019-04-28 DIAGNOSIS — I482 Chronic atrial fibrillation, unspecified: Secondary | ICD-10-CM

## 2019-04-28 DIAGNOSIS — Z5181 Encounter for therapeutic drug level monitoring: Secondary | ICD-10-CM

## 2019-04-28 LAB — POCT INR: INR: 1.3 — AB (ref 2.0–3.0)

## 2019-04-28 MED ORDER — APIXABAN 5 MG PO TABS: 5.0000 mg | ORAL_TABLET | Freq: Two times a day (BID) | ORAL | 2 refills | Status: DC

## 2019-04-28 MED ORDER — ISOSORBIDE MONONITRATE ER 30 MG PO TB24: 30.0000 mg | ORAL_TABLET | Freq: Every day | ORAL | 1 refills | Status: DC

## 2019-04-28 NOTE — Telephone Encounter (Signed)
134/87 Saturday am  Monday 160/80 and then before bedtime 126/78  States doing better. She will monitor daily for another week and call back if she has more higher readings, she does not want another medication at this time

## 2019-05-04 DIAGNOSIS — Z85850 Personal history of malignant neoplasm of thyroid: Secondary | ICD-10-CM | POA: Diagnosis not present

## 2019-05-04 DIAGNOSIS — I110 Hypertensive heart disease with heart failure: Secondary | ICD-10-CM | POA: Diagnosis not present

## 2019-05-04 DIAGNOSIS — I509 Heart failure, unspecified: Secondary | ICD-10-CM | POA: Diagnosis not present

## 2019-05-04 DIAGNOSIS — I482 Chronic atrial fibrillation, unspecified: Secondary | ICD-10-CM | POA: Diagnosis not present

## 2019-05-04 DIAGNOSIS — Q233 Congenital mitral insufficiency: Secondary | ICD-10-CM | POA: Diagnosis not present

## 2019-05-04 DIAGNOSIS — E039 Hypothyroidism, unspecified: Secondary | ICD-10-CM | POA: Diagnosis not present

## 2019-05-04 DIAGNOSIS — K219 Gastro-esophageal reflux disease without esophagitis: Secondary | ICD-10-CM | POA: Diagnosis not present

## 2019-05-04 DIAGNOSIS — Z7901 Long term (current) use of anticoagulants: Secondary | ICD-10-CM | POA: Diagnosis not present

## 2019-05-04 DIAGNOSIS — I5032 Chronic diastolic (congestive) heart failure: Secondary | ICD-10-CM | POA: Diagnosis not present

## 2019-05-06 DIAGNOSIS — M25522 Pain in left elbow: Secondary | ICD-10-CM | POA: Insufficient documentation

## 2019-05-06 DIAGNOSIS — R2230 Localized swelling, mass and lump, unspecified upper limb: Secondary | ICD-10-CM | POA: Insufficient documentation

## 2019-05-06 DIAGNOSIS — R2232 Localized swelling, mass and lump, left upper limb: Secondary | ICD-10-CM | POA: Diagnosis not present

## 2019-05-07 ENCOUNTER — Telehealth: Payer: Self-pay | Admitting: Cardiovascular Disease

## 2019-05-07 NOTE — Telephone Encounter (Signed)
ok 

## 2019-05-07 NOTE — Telephone Encounter (Signed)
New message:    Patient calling stating that the nurse called her yesterday. It is concering some medications.

## 2019-05-07 NOTE — Telephone Encounter (Signed)
Returned call to patient. She spoke with [name] LPN last week about her BP (see phone note 9/3). She reports her BP is better this week. She reports at doctor's appointments her BP has been 120s-130s and today her BP was 142/81 HR 77 before meds and even lower after. These are the only readings she has provided. She would like to not start any new medications.   Will route to MD and primary nurse to review

## 2019-05-08 DIAGNOSIS — I5032 Chronic diastolic (congestive) heart failure: Secondary | ICD-10-CM | POA: Diagnosis not present

## 2019-05-08 DIAGNOSIS — Z85850 Personal history of malignant neoplasm of thyroid: Secondary | ICD-10-CM | POA: Diagnosis not present

## 2019-05-08 DIAGNOSIS — Z7901 Long term (current) use of anticoagulants: Secondary | ICD-10-CM | POA: Diagnosis not present

## 2019-05-08 DIAGNOSIS — Q233 Congenital mitral insufficiency: Secondary | ICD-10-CM | POA: Diagnosis not present

## 2019-05-08 DIAGNOSIS — E039 Hypothyroidism, unspecified: Secondary | ICD-10-CM | POA: Diagnosis not present

## 2019-05-08 DIAGNOSIS — K219 Gastro-esophageal reflux disease without esophagitis: Secondary | ICD-10-CM | POA: Diagnosis not present

## 2019-05-08 DIAGNOSIS — I509 Heart failure, unspecified: Secondary | ICD-10-CM | POA: Diagnosis not present

## 2019-05-08 DIAGNOSIS — I110 Hypertensive heart disease with heart failure: Secondary | ICD-10-CM | POA: Diagnosis not present

## 2019-05-08 DIAGNOSIS — I482 Chronic atrial fibrillation, unspecified: Secondary | ICD-10-CM | POA: Diagnosis not present

## 2019-05-10 NOTE — Telephone Encounter (Signed)
Discussed with patient and she will continue to monitor blood pressure and call back if anything changes.   Patient has been taking Imdur 30 mg twice a day since she was decreased from 3 tablets daily June 2019. Advised to continue current dose and call when she needs refill

## 2019-05-10 NOTE — Telephone Encounter (Signed)
  Patient is calling to speak to [facility] regarding previous phone note

## 2019-05-11 ENCOUNTER — Ambulatory Visit: Payer: Medicare HMO

## 2019-05-11 DIAGNOSIS — I110 Hypertensive heart disease with heart failure: Secondary | ICD-10-CM | POA: Diagnosis not present

## 2019-05-11 DIAGNOSIS — I509 Heart failure, unspecified: Secondary | ICD-10-CM | POA: Diagnosis not present

## 2019-05-11 DIAGNOSIS — Q233 Congenital mitral insufficiency: Secondary | ICD-10-CM | POA: Diagnosis not present

## 2019-05-11 DIAGNOSIS — I5032 Chronic diastolic (congestive) heart failure: Secondary | ICD-10-CM | POA: Diagnosis not present

## 2019-05-11 DIAGNOSIS — Z7901 Long term (current) use of anticoagulants: Secondary | ICD-10-CM | POA: Diagnosis not present

## 2019-05-11 DIAGNOSIS — K219 Gastro-esophageal reflux disease without esophagitis: Secondary | ICD-10-CM | POA: Diagnosis not present

## 2019-05-11 DIAGNOSIS — E039 Hypothyroidism, unspecified: Secondary | ICD-10-CM | POA: Diagnosis not present

## 2019-05-11 DIAGNOSIS — Z85850 Personal history of malignant neoplasm of thyroid: Secondary | ICD-10-CM | POA: Diagnosis not present

## 2019-05-11 DIAGNOSIS — I482 Chronic atrial fibrillation, unspecified: Secondary | ICD-10-CM | POA: Diagnosis not present

## 2019-05-11 NOTE — Telephone Encounter (Signed)
That's fine

## 2019-05-12 ENCOUNTER — Other Ambulatory Visit: Payer: Self-pay

## 2019-05-12 DIAGNOSIS — I5032 Chronic diastolic (congestive) heart failure: Secondary | ICD-10-CM | POA: Diagnosis not present

## 2019-05-12 DIAGNOSIS — I509 Heart failure, unspecified: Secondary | ICD-10-CM | POA: Diagnosis not present

## 2019-05-12 DIAGNOSIS — I482 Chronic atrial fibrillation, unspecified: Secondary | ICD-10-CM | POA: Diagnosis not present

## 2019-05-12 DIAGNOSIS — Z85850 Personal history of malignant neoplasm of thyroid: Secondary | ICD-10-CM | POA: Diagnosis not present

## 2019-05-12 DIAGNOSIS — Z7901 Long term (current) use of anticoagulants: Secondary | ICD-10-CM | POA: Diagnosis not present

## 2019-05-12 DIAGNOSIS — I110 Hypertensive heart disease with heart failure: Secondary | ICD-10-CM | POA: Diagnosis not present

## 2019-05-12 DIAGNOSIS — Q233 Congenital mitral insufficiency: Secondary | ICD-10-CM | POA: Diagnosis not present

## 2019-05-12 DIAGNOSIS — E039 Hypothyroidism, unspecified: Secondary | ICD-10-CM | POA: Diagnosis not present

## 2019-05-12 DIAGNOSIS — K219 Gastro-esophageal reflux disease without esophagitis: Secondary | ICD-10-CM | POA: Diagnosis not present

## 2019-05-12 NOTE — Patient Outreach (Signed)
[facility]) Care Management  05/12/2019   Kristen Whitehead [birth year] [id number]  Subjective: Successful outreach to the patient for assessment.  Two patient identifiers given.  The patient states that she is doing well.  She denies having any pain swelling or weakness.  She does have some shortness of breath with exertion.  She did not weigh today because she woke up late and the physical therapist came.  She states this is her first week working with him.  She still has the [facility] from [facility] to come to her home weekly to fill her pill box.  She states that she has a knot under her left elbow that was red and had some heat.  She went to the doctor and was given a strong antibiotic which she has finished.  She still has the knot but no warmth.  I advised the patient that I will be moving to a different position and one of my colleague will follow up with her at the next scheduled visit.      Current Medications:  Current Outpatient Medications  Medication Sig Dispense Refill  . apixaban (ELIQUIS) 5 MG TABS tablet Take 1 tablet (5 mg total) by mouth 2 (two) times daily. 60 tablet 2  . apixaban (ELIQUIS) 5 MG TABS tablet Take 1 tablet (5 mg total) by mouth 2 (two) times daily. 60 tablet 2  . Calcium Carbonate-Vitamin D (CALCIUM 600+D PO) Take 1 tablet by mouth 2 (two) times daily.    . Cholecalciferol (VITAMIN D) 2000 UNITS tablet Take 4,000 Units by mouth daily.     . dorzolamide (TRUSOPT) 2 % ophthalmic solution Place 1 drop into the left eye 2 (two) times daily.    . furosemide (LASIX) 40 MG tablet Take 40 mg by mouth. Take 40mg  in AM and 20mg  in evening    . irbesartan (AVAPRO) 300 MG tablet Take 1/2 tablet twice a day 90 tablet 0  . isosorbide mononitrate (IMDUR) 30 MG 24 hr tablet Take 30 mg by mouth 2 (two) times daily.    Marland Kitchen latanoprost (XALATAN) 0.005 % ophthalmic solution Place 1 drop into both eyes at bedtime.     Marland Kitchen levothyroxine (SYNTHROID, LEVOTHROID) 100  MCG tablet Take 100 mcg by mouth daily.     Marland Kitchen MAGNESIUM OXIDE 400 PO Take 800 mg by mouth daily.    . Multiple Vitamins-Minerals (CENTRUM SILVER PO) Take 1 tablet by mouth daily.    . Multiple Vitamins-Minerals (PRESERVISION AREDS 2 PO) Take 1 capsule by mouth 2 (two) times daily.     . nitroGLYCERIN (NITROSTAT) 0.4 MG SL tablet DISSOLVE 1 TABLET UNDER THE TONGUE EVERY 5 MINUTES FOR 3 DOSES AS NEEDED  FOR  CHEST  PAIN 50 tablet 2  . potassium chloride (K-DUR) 10 MEQ tablet Take 1 tablet (10 mEq total) by mouth daily. 90 tablet 1  . pramipexole (MIRAPEX) 0.125 MG tablet Take 1 tablet (0.125 mg total) by mouth every evening. 90 tablet 3  . PREMARIN vaginal cream Place 2 g vaginally once a week. Uses on Sunday and wednesday    . Probiotic Product (PROBIOTIC DAILY PO) Take 1 tablet by mouth daily.    . temazepam (RESTORIL) 15 MG capsule Take 15 mg by mouth at bedtime.    Marland Kitchen atenolol (TENORMIN) 50 MG tablet Take 1 tablet (50 mg total) by mouth 2 (two) times daily. 180 tablet 3   No current facility-administered medications for this visit.     Functional Status:  In  your present state of health, do you have any difficulty performing the following activities: 10/01/2018  Hearing? N  Vision? N  Difficulty concentrating or making decisions? N  Walking or climbing stairs? Y  Dressing or bathing? N  Doing errands, shopping? Y  Preparing Food and eating ? N  Using the Toilet? N  In the past six months, have you accidently leaked urine? N  Do you have problems with loss of bowel control? N  Managing your Medications? Y  Managing your Finances? N  Housekeeping or managing your Housekeeping? Y  Some recent data might be hidden    Fall/Depression Screening: Fall Risk  05/12/2019 04/08/2019 03/05/2019  Falls in the past year? 0 1 0  Number falls in past yr: - 0 -  Injury with Fall? - 0 -  Comment - - -  Risk for fall due to : - Impaired balance/gait -  Follow up - Falls evaluation  completed;Education provided -   [facility] 2/9 Scores 10/01/2018 04/02/2018 01/08/2018 12/16/2017  PHQ - 2 Score 0 0 0 0    Assessment: Patient will continue to benefit from health coach outreach for disease management and support. THN CM Care Plan Problem One     Most Recent Value  THN Long Term Goal   In 30 days the patient will verbalize no admission due to chf exacerbation  THN Long Term Goal Start Date  05/12/19  Interventions for Problem One Long Term Goal  Reviewed the signs and symptoms of CHF, encouraged continued medication adherence, encouraged continued PT exercises to build strength,     Plan: [facility] will contact patient in the month of October and patient agrees to next outreach.   Lazaro Arms RN, BSN, [place] Direct Dial:  [phone]  Fax: [phone]

## 2019-05-13 ENCOUNTER — Telehealth: Payer: Self-pay | Admitting: *Deleted

## 2019-05-13 NOTE — Telephone Encounter (Signed)
May stop the afternoon lasix 20 mg. Continue daily weights. Call for issues with edema

## 2019-05-13 NOTE — Telephone Encounter (Signed)
Advised patient, verbalized understanding  

## 2019-05-13 NOTE — Telephone Encounter (Signed)
Spoke with patient and she weighed herself 9/22 and she was 138.8 lb, today 127.4 lb. She has been up a lot at night urinating lately. Blood pressure today 118/75. She is feeling unsteady on her feet. Confirmed Lasix dose of 40 mg and 20 mg in afternoon. Patient denies any swelling in her ankles or feet Will forward to [place] who last saw patient for  review

## 2019-05-14 DIAGNOSIS — Z7901 Long term (current) use of anticoagulants: Secondary | ICD-10-CM | POA: Diagnosis not present

## 2019-05-14 DIAGNOSIS — I5032 Chronic diastolic (congestive) heart failure: Secondary | ICD-10-CM | POA: Diagnosis not present

## 2019-05-14 DIAGNOSIS — I482 Chronic atrial fibrillation, unspecified: Secondary | ICD-10-CM | POA: Diagnosis not present

## 2019-05-14 DIAGNOSIS — Q233 Congenital mitral insufficiency: Secondary | ICD-10-CM | POA: Diagnosis not present

## 2019-05-14 DIAGNOSIS — K219 Gastro-esophageal reflux disease without esophagitis: Secondary | ICD-10-CM | POA: Diagnosis not present

## 2019-05-14 DIAGNOSIS — I110 Hypertensive heart disease with heart failure: Secondary | ICD-10-CM | POA: Diagnosis not present

## 2019-05-14 DIAGNOSIS — E039 Hypothyroidism, unspecified: Secondary | ICD-10-CM | POA: Diagnosis not present

## 2019-05-14 DIAGNOSIS — Z85850 Personal history of malignant neoplasm of thyroid: Secondary | ICD-10-CM | POA: Diagnosis not present

## 2019-05-14 DIAGNOSIS — I509 Heart failure, unspecified: Secondary | ICD-10-CM | POA: Diagnosis not present

## 2019-05-14 NOTE — Telephone Encounter (Signed)
Follow Up  Patient loss 4.6 lbs overnight and the night before that she gained 4.8 lbs due to forgetting to take the medication. Patient states that she wanted to make the office aware of the change in her weight as instructed. Please give patient a call back.

## 2019-05-14 NOTE — Telephone Encounter (Signed)
Spoke with patient and she only took one Lasix yesterday and weight up. Denies any shortness of breath or swelling. Advised ok to take the Lasix 20 mg now and for the next couple of days if no improvement in weight. Blood pressure when PT came was good but she did not write down. Continue to monitor and call back if any further issues. Patient to call Monday with update

## 2019-05-17 DIAGNOSIS — N8182 Incompetence or weakening of pubocervical tissue: Secondary | ICD-10-CM | POA: Diagnosis not present

## 2019-05-18 DIAGNOSIS — K219 Gastro-esophageal reflux disease without esophagitis: Secondary | ICD-10-CM | POA: Diagnosis not present

## 2019-05-18 DIAGNOSIS — Z85850 Personal history of malignant neoplasm of thyroid: Secondary | ICD-10-CM | POA: Diagnosis not present

## 2019-05-18 DIAGNOSIS — I5032 Chronic diastolic (congestive) heart failure: Secondary | ICD-10-CM | POA: Diagnosis not present

## 2019-05-18 DIAGNOSIS — Q233 Congenital mitral insufficiency: Secondary | ICD-10-CM | POA: Diagnosis not present

## 2019-05-18 DIAGNOSIS — I110 Hypertensive heart disease with heart failure: Secondary | ICD-10-CM | POA: Diagnosis not present

## 2019-05-18 DIAGNOSIS — I482 Chronic atrial fibrillation, unspecified: Secondary | ICD-10-CM | POA: Diagnosis not present

## 2019-05-18 DIAGNOSIS — E039 Hypothyroidism, unspecified: Secondary | ICD-10-CM | POA: Diagnosis not present

## 2019-05-18 DIAGNOSIS — Z7901 Long term (current) use of anticoagulants: Secondary | ICD-10-CM | POA: Diagnosis not present

## 2019-05-18 DIAGNOSIS — I509 Heart failure, unspecified: Secondary | ICD-10-CM | POA: Diagnosis not present

## 2019-05-20 DIAGNOSIS — K219 Gastro-esophageal reflux disease without esophagitis: Secondary | ICD-10-CM | POA: Diagnosis not present

## 2019-05-20 DIAGNOSIS — Q233 Congenital mitral insufficiency: Secondary | ICD-10-CM | POA: Diagnosis not present

## 2019-05-20 DIAGNOSIS — E039 Hypothyroidism, unspecified: Secondary | ICD-10-CM | POA: Diagnosis not present

## 2019-05-20 DIAGNOSIS — Z85850 Personal history of malignant neoplasm of thyroid: Secondary | ICD-10-CM | POA: Diagnosis not present

## 2019-05-20 DIAGNOSIS — I5032 Chronic diastolic (congestive) heart failure: Secondary | ICD-10-CM | POA: Diagnosis not present

## 2019-05-20 DIAGNOSIS — I509 Heart failure, unspecified: Secondary | ICD-10-CM | POA: Diagnosis not present

## 2019-05-20 DIAGNOSIS — Z7901 Long term (current) use of anticoagulants: Secondary | ICD-10-CM | POA: Diagnosis not present

## 2019-05-20 DIAGNOSIS — I110 Hypertensive heart disease with heart failure: Secondary | ICD-10-CM | POA: Diagnosis not present

## 2019-05-20 DIAGNOSIS — I482 Chronic atrial fibrillation, unspecified: Secondary | ICD-10-CM | POA: Diagnosis not present

## 2019-05-24 ENCOUNTER — Other Ambulatory Visit: Payer: Self-pay

## 2019-05-24 DIAGNOSIS — I509 Heart failure, unspecified: Secondary | ICD-10-CM | POA: Diagnosis not present

## 2019-05-24 DIAGNOSIS — I110 Hypertensive heart disease with heart failure: Secondary | ICD-10-CM | POA: Diagnosis not present

## 2019-05-24 DIAGNOSIS — E039 Hypothyroidism, unspecified: Secondary | ICD-10-CM | POA: Diagnosis not present

## 2019-05-24 DIAGNOSIS — K219 Gastro-esophageal reflux disease without esophagitis: Secondary | ICD-10-CM | POA: Diagnosis not present

## 2019-05-24 DIAGNOSIS — Q233 Congenital mitral insufficiency: Secondary | ICD-10-CM | POA: Diagnosis not present

## 2019-05-24 DIAGNOSIS — I5032 Chronic diastolic (congestive) heart failure: Secondary | ICD-10-CM | POA: Diagnosis not present

## 2019-05-24 DIAGNOSIS — Z85850 Personal history of malignant neoplasm of thyroid: Secondary | ICD-10-CM | POA: Diagnosis not present

## 2019-05-24 DIAGNOSIS — Z7901 Long term (current) use of anticoagulants: Secondary | ICD-10-CM | POA: Diagnosis not present

## 2019-05-24 DIAGNOSIS — I482 Chronic atrial fibrillation, unspecified: Secondary | ICD-10-CM | POA: Diagnosis not present

## 2019-05-24 MED ORDER — APIXABAN 5 MG PO TABS: 5.0000 mg | ORAL_TABLET | Freq: Two times a day (BID) | ORAL | 1 refills | Status: DC

## 2019-05-26 ENCOUNTER — Encounter (INDEPENDENT_AMBULATORY_CARE_PROVIDER_SITE_OTHER): Payer: Medicare HMO | Admitting: Ophthalmology

## 2019-05-26 DIAGNOSIS — H3562 Retinal hemorrhage, left eye: Secondary | ICD-10-CM | POA: Diagnosis not present

## 2019-05-26 DIAGNOSIS — H35033 Hypertensive retinopathy, bilateral: Secondary | ICD-10-CM | POA: Diagnosis not present

## 2019-05-26 DIAGNOSIS — Q233 Congenital mitral insufficiency: Secondary | ICD-10-CM | POA: Diagnosis not present

## 2019-05-26 DIAGNOSIS — E039 Hypothyroidism, unspecified: Secondary | ICD-10-CM | POA: Diagnosis not present

## 2019-05-26 DIAGNOSIS — H35342 Macular cyst, hole, or pseudohole, left eye: Secondary | ICD-10-CM | POA: Diagnosis not present

## 2019-05-26 DIAGNOSIS — Z85850 Personal history of malignant neoplasm of thyroid: Secondary | ICD-10-CM | POA: Diagnosis not present

## 2019-05-26 DIAGNOSIS — I5032 Chronic diastolic (congestive) heart failure: Secondary | ICD-10-CM | POA: Diagnosis not present

## 2019-05-26 DIAGNOSIS — I482 Chronic atrial fibrillation, unspecified: Secondary | ICD-10-CM | POA: Diagnosis not present

## 2019-05-26 DIAGNOSIS — K219 Gastro-esophageal reflux disease without esophagitis: Secondary | ICD-10-CM | POA: Diagnosis not present

## 2019-05-26 DIAGNOSIS — I10 Essential (primary) hypertension: Secondary | ICD-10-CM

## 2019-05-26 DIAGNOSIS — I110 Hypertensive heart disease with heart failure: Secondary | ICD-10-CM | POA: Diagnosis not present

## 2019-05-26 DIAGNOSIS — I509 Heart failure, unspecified: Secondary | ICD-10-CM | POA: Diagnosis not present

## 2019-05-26 DIAGNOSIS — Z7901 Long term (current) use of anticoagulants: Secondary | ICD-10-CM | POA: Diagnosis not present

## 2019-05-26 DIAGNOSIS — H353132 Nonexudative age-related macular degeneration, bilateral, intermediate dry stage: Secondary | ICD-10-CM | POA: Diagnosis not present

## 2019-05-26 DIAGNOSIS — H43813 Vitreous degeneration, bilateral: Secondary | ICD-10-CM

## 2019-05-28 DIAGNOSIS — I509 Heart failure, unspecified: Secondary | ICD-10-CM | POA: Diagnosis not present

## 2019-05-28 DIAGNOSIS — I482 Chronic atrial fibrillation, unspecified: Secondary | ICD-10-CM | POA: Diagnosis not present

## 2019-05-28 DIAGNOSIS — Z7901 Long term (current) use of anticoagulants: Secondary | ICD-10-CM | POA: Diagnosis not present

## 2019-05-28 DIAGNOSIS — I110 Hypertensive heart disease with heart failure: Secondary | ICD-10-CM | POA: Diagnosis not present

## 2019-05-28 DIAGNOSIS — K219 Gastro-esophageal reflux disease without esophagitis: Secondary | ICD-10-CM | POA: Diagnosis not present

## 2019-05-28 DIAGNOSIS — Q233 Congenital mitral insufficiency: Secondary | ICD-10-CM | POA: Diagnosis not present

## 2019-05-28 DIAGNOSIS — I5032 Chronic diastolic (congestive) heart failure: Secondary | ICD-10-CM | POA: Diagnosis not present

## 2019-05-28 DIAGNOSIS — E039 Hypothyroidism, unspecified: Secondary | ICD-10-CM | POA: Diagnosis not present

## 2019-05-28 DIAGNOSIS — Z85850 Personal history of malignant neoplasm of thyroid: Secondary | ICD-10-CM | POA: Diagnosis not present

## 2019-06-01 DIAGNOSIS — I482 Chronic atrial fibrillation, unspecified: Secondary | ICD-10-CM | POA: Diagnosis not present

## 2019-06-01 DIAGNOSIS — Z85850 Personal history of malignant neoplasm of thyroid: Secondary | ICD-10-CM | POA: Diagnosis not present

## 2019-06-01 DIAGNOSIS — E039 Hypothyroidism, unspecified: Secondary | ICD-10-CM | POA: Diagnosis not present

## 2019-06-01 DIAGNOSIS — Q233 Congenital mitral insufficiency: Secondary | ICD-10-CM | POA: Diagnosis not present

## 2019-06-01 DIAGNOSIS — I509 Heart failure, unspecified: Secondary | ICD-10-CM | POA: Diagnosis not present

## 2019-06-01 DIAGNOSIS — I110 Hypertensive heart disease with heart failure: Secondary | ICD-10-CM | POA: Diagnosis not present

## 2019-06-01 DIAGNOSIS — K219 Gastro-esophageal reflux disease without esophagitis: Secondary | ICD-10-CM | POA: Diagnosis not present

## 2019-06-01 DIAGNOSIS — Z7901 Long term (current) use of anticoagulants: Secondary | ICD-10-CM | POA: Diagnosis not present

## 2019-06-01 DIAGNOSIS — I5032 Chronic diastolic (congestive) heart failure: Secondary | ICD-10-CM | POA: Diagnosis not present

## 2019-06-02 ENCOUNTER — Other Ambulatory Visit: Payer: Self-pay

## 2019-06-02 MED ORDER — APIXABAN 5 MG PO TABS: 5.0000 mg | ORAL_TABLET | Freq: Two times a day (BID) | ORAL | 1 refills | Status: DC

## 2019-06-03 DIAGNOSIS — Z85850 Personal history of malignant neoplasm of thyroid: Secondary | ICD-10-CM | POA: Diagnosis not present

## 2019-06-03 DIAGNOSIS — Q233 Congenital mitral insufficiency: Secondary | ICD-10-CM | POA: Diagnosis not present

## 2019-06-03 DIAGNOSIS — I5032 Chronic diastolic (congestive) heart failure: Secondary | ICD-10-CM | POA: Diagnosis not present

## 2019-06-03 DIAGNOSIS — Z7901 Long term (current) use of anticoagulants: Secondary | ICD-10-CM | POA: Diagnosis not present

## 2019-06-03 DIAGNOSIS — I482 Chronic atrial fibrillation, unspecified: Secondary | ICD-10-CM | POA: Diagnosis not present

## 2019-06-03 DIAGNOSIS — I110 Hypertensive heart disease with heart failure: Secondary | ICD-10-CM | POA: Diagnosis not present

## 2019-06-03 DIAGNOSIS — K219 Gastro-esophageal reflux disease without esophagitis: Secondary | ICD-10-CM | POA: Diagnosis not present

## 2019-06-03 DIAGNOSIS — E039 Hypothyroidism, unspecified: Secondary | ICD-10-CM | POA: Diagnosis not present

## 2019-06-03 DIAGNOSIS — I509 Heart failure, unspecified: Secondary | ICD-10-CM | POA: Diagnosis not present

## 2019-06-08 DIAGNOSIS — E039 Hypothyroidism, unspecified: Secondary | ICD-10-CM | POA: Diagnosis not present

## 2019-06-08 DIAGNOSIS — I482 Chronic atrial fibrillation, unspecified: Secondary | ICD-10-CM | POA: Diagnosis not present

## 2019-06-08 DIAGNOSIS — Z85850 Personal history of malignant neoplasm of thyroid: Secondary | ICD-10-CM | POA: Diagnosis not present

## 2019-06-08 DIAGNOSIS — I509 Heart failure, unspecified: Secondary | ICD-10-CM | POA: Diagnosis not present

## 2019-06-08 DIAGNOSIS — Q233 Congenital mitral insufficiency: Secondary | ICD-10-CM | POA: Diagnosis not present

## 2019-06-08 DIAGNOSIS — I5032 Chronic diastolic (congestive) heart failure: Secondary | ICD-10-CM | POA: Diagnosis not present

## 2019-06-08 DIAGNOSIS — Z7901 Long term (current) use of anticoagulants: Secondary | ICD-10-CM | POA: Diagnosis not present

## 2019-06-08 DIAGNOSIS — K219 Gastro-esophageal reflux disease without esophagitis: Secondary | ICD-10-CM | POA: Diagnosis not present

## 2019-06-08 DIAGNOSIS — I110 Hypertensive heart disease with heart failure: Secondary | ICD-10-CM | POA: Diagnosis not present

## 2019-06-14 ENCOUNTER — Other Ambulatory Visit: Payer: Self-pay

## 2019-06-14 NOTE — Patient Outreach (Signed)
Kristen Whitehead) Care Management  06/14/2019   LASHONIA HENJUM 1923-03-25 [id number]   Telephone Assessment  Outreach attempt #1 to patient for monthly phone call. Spoke with patient who reports she is still lying in bed. She reports that her legs have been bothering her as they "feel like boiling water being poured over them." She voices that this is not a new issue and has been told it was neuropathy. She reports that it does not warrant her to have to take anything for it but she just rests. Patient states that "lump on elbow" disappeared a few weeks ago. She did go get it checked out by MD and was told she needed an MRI but prior to test being scheduled it dissolved on its own and she has had no further problems with it. Patient voices that her BP has been slightly elevated and she does not know why. RN C reviewed diet with patient. Patient reports she is primarily eating frozen meals-at least 2-3x/day. RN CM educated pt on the increased sodium found in these meals and possible cause of BP being higher. RN CM strongly encouraged pt.to limit/decrease frozen meal consumption. Patient voices that she does not like to cook as she lives alone and relies on those meals. RN CM provided healthier options for patient to consider. She reports that weight is stable and fluctuates about 1-2 pounds. She denies any edema at present. Patient reports that she went to the store([facility]) for the first time since COVID-19 outbreak as her family has been doing all of her shopping for her. However, she reports that she belongs to a [employer] and has been attending church services weekly. RN CM reviewed safety guidelines given that patient is in high risk category. She voiced adherence to guidelines. She reports that she has completed HH PT.    Current Medications:  Current Outpatient Medications  Medication Sig Dispense Refill  . apixaban (ELIQUIS) 5 MG TABS tablet Take 1 tablet (5 mg total) by  mouth 2 (two) times daily. 180 tablet 1  . atenolol (TENORMIN) 50 MG tablet Take 1 tablet (50 mg total) by mouth 2 (two) times daily. 180 tablet 3  . Calcium Carbonate-Vitamin D (CALCIUM 600+D PO) Take 1 tablet by mouth 2 (two) times daily.    . Cholecalciferol (VITAMIN D) 2000 UNITS tablet Take 4,000 Units by mouth daily.     . dorzolamide (TRUSOPT) 2 % ophthalmic solution Place 1 drop into the left eye 2 (two) times daily.    . furosemide (LASIX) 40 MG tablet Take 40 mg by mouth daily.     . irbesartan (AVAPRO) 300 MG tablet Take 1/2 tablet twice a day 90 tablet 0  . isosorbide mononitrate (IMDUR) 30 MG 24 hr tablet Take 30 mg by mouth 2 (two) times daily.    Marland Kitchen latanoprost (XALATAN) 0.005 % ophthalmic solution Place 1 drop into both eyes at bedtime.     Marland Kitchen levothyroxine (SYNTHROID, LEVOTHROID) 100 MCG tablet Take 100 mcg by mouth daily.     Marland Kitchen MAGNESIUM OXIDE 400 PO Take 800 mg by mouth daily.    . Multiple Vitamins-Minerals (CENTRUM SILVER PO) Take 1 tablet by mouth daily.    . Multiple Vitamins-Minerals (PRESERVISION AREDS 2 PO) Take 1 capsule by mouth 2 (two) times daily.     . nitroGLYCERIN (NITROSTAT) 0.4 MG SL tablet DISSOLVE 1 TABLET UNDER THE TONGUE EVERY 5 MINUTES FOR 3 DOSES AS NEEDED  FOR  CHEST  PAIN 50 tablet 2  .  potassium chloride (K-DUR) 10 MEQ tablet Take 1 tablet (10 mEq total) by mouth daily. 90 tablet 1  . pramipexole (MIRAPEX) 0.125 MG tablet Take 1 tablet (0.125 mg total) by mouth every evening. 90 tablet 3  . PREMARIN vaginal cream Place 2 g vaginally once a week. Uses on Sunday and wednesday    . Probiotic Product (PROBIOTIC DAILY PO) Take 1 tablet by mouth daily.    . temazepam (RESTORIL) 15 MG capsule Take 15 mg by mouth at bedtime.     No current facility-administered medications for this visit.     Functional Status:  In your present state of health, do you have any difficulty performing the following activities: 10/01/2018  Hearing? N  Vision? N  Difficulty  concentrating or making decisions? N  Walking or climbing stairs? Y  Dressing or bathing? N  Doing errands, shopping? Y  Preparing Food and eating ? N  Using the Toilet? N  In the past six months, have you accidently leaked urine? N  Do you have problems with loss of bowel control? N  Managing your Medications? Y  Managing your Finances? N  Housekeeping or managing your Housekeeping? Y  Some recent data might be hidden    Fall/Depression Screening: Fall Risk  06/14/2019 05/12/2019 04/08/2019  Falls in the past year? 1 0 1  Number falls in past yr: 0 - 0  Injury with Fall? 1 - 0  Comment - - -  Risk for fall due to : History of fall(s);Impaired vision;Impaired balance/gait;Medication side effect - Impaired balance/gait  Follow up Falls evaluation completed;Education provided - Falls evaluation completed;Education provided   [facility] 2/9 Scores 10/01/2018 04/02/2018 01/08/2018 12/16/2017  PHQ - 2 Score 0 0 0 0    Assessment: THN CM Care Plan Problem One     Most Recent Value  Care Plan Problem One  Knowledge deficit regarding mgmt and disease process of CHF  Role Documenting the Problem One  Care Management Telephonic Coordinator  Care Plan for Problem One  Active  THN Long Term Goal   Patient will hvae no CHF related hospitalizations over the next 90 days.  THN Long Term Goal Start Date  06/14/19  Interventions for Problem One Long Term Goal  RN CM provided CHF disease process and sx mgmt education to patient. RN CM reviewed with pt s/s of worsening condition and when to seek medical attention.  THN CM Short Term Goal #1   Patient will report decrease usage of high sodium frozen meals over the next 30 days.  THN CM Short Term Goal #1 Start Date  06/14/19  Interventions for Short Term Goal #1  RN CM provided education to pt. on salt intake and impacts on weight and BP and overall CHF health. RN CM provided alternative suggestions to patient to help with food intake.  THN CM Short Term Goal  #2   Patient will report daily monitoring of weight and BP in the home over the next 30 days.  THN CM Short Term Goal #2 Start Date  06/14/19  Interventions for Short Term Goal #2  RN CM reviewed and discussed with patient the importance of monitoring in the home. RN CM reviewed with pt. abnormal parameters and when to seek medical attention.       Plan:  RN CM will make outreach attempt to patient within the month of November. RN CM will send quarterly update to PCP.    [name], RN,BSN,CCM [facility] Care Management Telephonic Care Management  Coordinator Direct Phone: [phone] Toll Free: [phone] Fax: [phone]

## 2019-06-15 ENCOUNTER — Encounter: Payer: Self-pay | Admitting: Cardiovascular Disease

## 2019-06-15 ENCOUNTER — Ambulatory Visit (INDEPENDENT_AMBULATORY_CARE_PROVIDER_SITE_OTHER): Payer: Medicare HMO | Admitting: Cardiovascular Disease

## 2019-06-15 ENCOUNTER — Other Ambulatory Visit: Payer: Self-pay

## 2019-06-15 VITALS — BP 140/70 | HR 72 | Ht 59.0 in | Wt 135.0 lb

## 2019-06-15 DIAGNOSIS — H4089 Other specified glaucoma: Secondary | ICD-10-CM | POA: Diagnosis not present

## 2019-06-15 DIAGNOSIS — I6523 Occlusion and stenosis of bilateral carotid arteries: Secondary | ICD-10-CM | POA: Diagnosis not present

## 2019-06-15 DIAGNOSIS — I5032 Chronic diastolic (congestive) heart failure: Secondary | ICD-10-CM | POA: Diagnosis not present

## 2019-06-15 DIAGNOSIS — E785 Hyperlipidemia, unspecified: Secondary | ICD-10-CM | POA: Diagnosis not present

## 2019-06-15 DIAGNOSIS — I110 Hypertensive heart disease with heart failure: Secondary | ICD-10-CM | POA: Diagnosis not present

## 2019-06-15 DIAGNOSIS — M810 Age-related osteoporosis without current pathological fracture: Secondary | ICD-10-CM | POA: Diagnosis not present

## 2019-06-15 DIAGNOSIS — I10 Essential (primary) hypertension: Secondary | ICD-10-CM

## 2019-06-15 DIAGNOSIS — K219 Gastro-esophageal reflux disease without esophagitis: Secondary | ICD-10-CM | POA: Diagnosis not present

## 2019-06-15 DIAGNOSIS — Z7901 Long term (current) use of anticoagulants: Secondary | ICD-10-CM

## 2019-06-15 DIAGNOSIS — E039 Hypothyroidism, unspecified: Secondary | ICD-10-CM | POA: Diagnosis not present

## 2019-06-15 DIAGNOSIS — Q233 Congenital mitral insufficiency: Secondary | ICD-10-CM | POA: Diagnosis not present

## 2019-06-15 DIAGNOSIS — I482 Chronic atrial fibrillation, unspecified: Secondary | ICD-10-CM

## 2019-06-15 DIAGNOSIS — M8580 Other specified disorders of bone density and structure, unspecified site: Secondary | ICD-10-CM | POA: Diagnosis not present

## 2019-06-15 DIAGNOSIS — Z85850 Personal history of malignant neoplasm of thyroid: Secondary | ICD-10-CM | POA: Diagnosis not present

## 2019-06-15 DIAGNOSIS — E890 Postprocedural hypothyroidism: Secondary | ICD-10-CM | POA: Diagnosis not present

## 2019-06-15 DIAGNOSIS — I509 Heart failure, unspecified: Secondary | ICD-10-CM | POA: Diagnosis not present

## 2019-06-15 NOTE — Progress Notes (Signed)
Cardiology Office Note   Date:  06/15/2019   ID:  Kristen, Whitehead 01/10/1923, MRN [id number]  PCP:  Leighton Ruff, MD  Cardiologist:   [name], MD   No chief complaint on file.     History of Present Illness: Kristen Whitehead is a 85 y.o. female with chronic atrial fibrillation, hypertension, hyperlipidemia, mild MR and AR and chronic diastolic heart failure who presents for follow up. [name] was previously a patient of [name]. She last saw him 08/2015 for follow-up on frequent episodes of chest pain. She had been referred for stress testing but declined, as she has not tolerated Lexiscan well in the past. Troponin was checked and was negative. This is been a chronic problem so further evaluation was deferred. Her stress test in 2013 was negative for ischemia. She reports having "heart attacks" that occur when she overexerts herself. When this happens she gets pain across her chest that is associated with shortness of breath. She denies lower extremity edema, orthopnea or PND. She watched a documentary on broken heart syndrome and thinks this must be what happens to her. When she gets the pain she prays for God to take it away and eventually the pain subsides. There is no associated nausea or diaphoresis. Imdur was increased to 90 mg daily on 10/2015. Since then she had an episode of heart failure 01/2016 that responded to increased lasix. She also has chronic exertional dyspnea.  [name], [place], on 12/11/16. Lasix was increased to 60 mg in the morning and 40 in the afternoon. There was no change in her symptoms.  She followed up with [name], DNP on 01/2018 and her energy was worse but her BP was better. She felt better with physical activity. Her BP was low in the morning so Imdur was decreased to 60mg  and she started taking it in the morning instead of the afternoon. At her last appointment her BP was  poorly controlled.  Irbesartan was increased to 150mg  bid and atenolol was increased.  Since then her BP has been in the 150s-170s/100s.  She notes that she doesn't eat out much but she does eat frozen dinners.  When she checks her BP at home she doesn't rest before checking it. She notes that she has been unsteady on her feet.  Her gait instability seems to be worse when she is tired.  She fell 3 months ago.  She tripped and fell backwards.  She worked with physical therapy and thinks that this is helped.  She gave away her treadmill and her dog.  Since then she is not walking as much and she thinks her ability to walk has diminished.  When she first lays down she feels short of breath especially when lifting her legs.  However she denies orthopnea or PND.  She has no shortness of breath when walking.  She has not experienced any chest pain or pressure.   Past Medical History:  Diagnosis Date  . Bladder prolapse, female, acquired   . Cancer ([place])    thyroid  . Chest pain    no known ischemic heart disease; negative Myoview July 2013. EF 75% with no ischemia.   . Chronic anticoagulation   . Chronic atrial fibrillation (HCC)    managed with rate control and coumadin  . Chronic diastolic heart failure ([place])   . GERD (gastroesophageal reflux disease)   . Heart disease   . History of congenital mitral regurgitation  mild  . History of mitral valve prolapse 06/15/2008   a. echo 1/14: mild LVH, EF 60-65%, mild MR, mild to mod BAE, PASP 35  . Hypercholesterolemia   . Hypertension   . Hypothyroidism     Past Surgical History:  Procedure Laterality Date  . ABDOMINAL HYSTERECTOMY    . CATARACT EXTRACTION    . CHOLECYSTECTOMY    . Thyroidectomy       Current Outpatient Medications  Medication Sig Dispense Refill  . apixaban (ELIQUIS) 5 MG TABS tablet Take 1 tablet (5 mg total) by mouth 2 (two) times daily. 180 tablet 1  . Calcium Carbonate-Vitamin D (CALCIUM 600+D PO) Take 1 tablet by  mouth 2 (two) times daily.    . Cholecalciferol (VITAMIN D) 2000 UNITS tablet Take 4,000 Units by mouth daily.     . dorzolamide (TRUSOPT) 2 % ophthalmic solution Place 1 drop into the left eye 2 (two) times daily.    . furosemide (LASIX) 40 MG tablet Take by mouth daily. 1 tab in the morning, 1/2 tab in afternoon    . irbesartan (AVAPRO) 300 MG tablet Take 1/2 tablet twice a day 90 tablet 0  . isosorbide mononitrate (IMDUR) 30 MG 24 hr tablet Take 30 mg by mouth 2 (two) times daily.    Marland Kitchen latanoprost (XALATAN) 0.005 % ophthalmic solution Place 1 drop into both eyes at bedtime.     Marland Kitchen levothyroxine (SYNTHROID, LEVOTHROID) 100 MCG tablet Take 100 mcg by mouth daily.     Marland Kitchen MAGNESIUM OXIDE 400 PO Take 800 mg by mouth daily.    . Multiple Vitamins-Minerals (CENTRUM SILVER PO) Take 1 tablet by mouth daily.    . Multiple Vitamins-Minerals (PRESERVISION AREDS 2 PO) Take 1 capsule by mouth 2 (two) times daily.     . nitroGLYCERIN (NITROSTAT) 0.4 MG SL tablet DISSOLVE 1 TABLET UNDER THE TONGUE EVERY 5 MINUTES FOR 3 DOSES AS NEEDED  FOR  CHEST  PAIN 50 tablet 2  . potassium chloride (K-DUR) 10 MEQ tablet Take 1 tablet (10 mEq total) by mouth daily. 90 tablet 1  . pramipexole (MIRAPEX) 0.125 MG tablet Take 1 tablet (0.125 mg total) by mouth every evening. 90 tablet 3  . PREMARIN vaginal cream Place 2 g vaginally once a week. Uses on Sunday and wednesday    . Probiotic Product (PROBIOTIC DAILY PO) Take 1 tablet by mouth daily.    . temazepam (RESTORIL) 15 MG capsule Take 15 mg by mouth at bedtime.    Marland Kitchen atenolol (TENORMIN) 50 MG tablet Take 1 tablet (50 mg total) by mouth 2 (two) times daily. 180 tablet 3   No current facility-administered medications for this visit.     Allergies:   Azithromycin, Compazine [prochlorperazine edisylate], Demerol, Dolophine [methadone], Ebastine, Erythromycin, Ibuprofen, Statins, Tetanus toxoids, and Tetracyclines & related    Social History:  The patient  reports that she  has never smoked. She has never used smokeless tobacco. She reports that she does not drink alcohol or use drugs.   Family History:  The patient's family history includes Stroke in her father and mother.    ROS:  Please see the history of present illness.   Otherwise, review of systems are positive for none.   All other systems are reviewed and negative.    PHYSICAL EXAM: VS:  BP 140/70   Pulse 72   Ht 4\' 11"  (1.499 m)   Wt 135 lb (61.2 kg)   BMI 27.27 kg/m  , BMI Body mass  index is 27.27 kg/m. GENERAL:  Well appearing HEENT: Pupils equal round and reactive, fundi not visualized, oral mucosa unremarkable NECK:  No jugular venous distention, waveform within normal limits, carotid upstroke brisk and symmetric, no bruits LUNGS:  Clear to auscultation bilaterally HEART:  Irregularly irregular.  PMI not displaced or sustained,S1 and S2 within normal limits, no S3, no S4, no clicks, no rubs, no murmurs ABD:  Flat, positive bowel sounds normal in frequency in pitch, no bruits, no rebound, no guarding, no midline pulsatile mass, no hepatomegaly, no splenomegaly EXT:  2 plus pulses throughout, 1+ L>R LE edema to the lower tibia, no cyanosis no clubbing SKIN:  No rashes no nodules NEURO:  Cranial nerves II through XII grossly intact, motor grossly intact throughout PSYCH:  Cognitively intact, oriented to person place and time   EKG:  EKG is ordered today. 05/06/16: Atrial fibrillation rate 72 bpm 06/15/19: Atrial fibrillation.  Rate 72 bpm.    Echo 11/11/14: Study Conclusions  - Left ventricle: The cavity size was normal. Wall thickness was normal. Systolic function was normal. The estimated ejection fraction was in the range of 60% to 65%. - Aortic valve: There was mild regurgitation. - Mitral valve: Calcified annulus. Mildly thickened leaflets . There was mild regurgitation. - Left atrium: The atrium was mildly dilated. - Right atrium: The atrium was mildly dilated.  Recent  Labs: 10/10/2018: ALT 23; Hemoglobin 12.5; Platelets 184 01/05/2019: BUN 12; Creatinine, Ser 0.80; NT-Pro BNP 2,323; Potassium 3.7; Sodium 139    Lipid Panel    Component Value Date/Time   CHOL 211 (H) 11/04/2012 1524   TRIG 201.0 (H) 11/04/2012 1524   HDL 66.80 11/04/2012 1524   CHOLHDL 3 11/04/2012 1524   VLDL 40.2 (H) 11/04/2012 1524   LDLCALC 109 (H) 04/16/2012 1043   LDLDIRECT 98.8 11/04/2012 1524   03/28/16: Sodium 136, potassium 4.1, BUN 17, creatinine 0.74 AST 24, ALT 17 WBC 10, hemoglobin 15.7, hematocrit 46.8, platelets 291  07/21/15: TSH 1.52, free T4 1 0.25  04/13/15: Total cholesterol 235, triglycerides 150, HDL 66, LDL 139  03/08/15: BNP 601  Wt Readings from Last 3 Encounters:  06/15/19 135 lb (61.2 kg)  03/24/19 135 lb (61.2 kg)  02/08/19 133 lb 3.2 oz (60.4 kg)      ASSESSMENT AND PLAN:  # Hypertension: BPis better but still poorly controlled.  Goal is <140/90.  At home it is elevated.  However she has been eating a lot of frozen meals.  She also isn't exercising and she always checks it right after sitting down.  She went to try to limit the salt in her diet and wants to start back walking every day.  She will wait 5 minutes after resting quietly before she checks her blood pressure.  Continue atenolol, furosemide, irbesartan, and Imdur.    # Chest pain:Resolved.   # Chronic atrial fibrillation:Rate remainswell-controlled. Continue atenolol.  She recently switched from warfarin to Eliquis.  This patients CHA2DS2-VASc Score and unadjusted Ischemic Stroke Rate (% per year) is equal to 4.8 % stroke rate/year from a score of 4  Above score calculated as 1 point each if present [CHF, HTN, DM, Vascular=MI/PAD/Aortic Plaque, Age if 65-74, or Female] Above score calculated as 2 points each if present [Age >75, or Stroke/TIA/TE]  #Chronic diastolic heart failure: Stable.  She has mild, chronic lower extremity edema.  Continue furosemide and blood  pressure control as above.  # Carotid Dopplers:  # Hyperlipidemia: Continue Eliquis.   LDL was 68  on 06/2018  Time spent: 40 minutes-Greater than 50% of this time was spent in counseling, explanation of diagnosis, planning of further management, and coordination of care.   Current medicines are reviewed at length with the patient today.  The patient does not have concerns regarding medicines.  The following changes have been made:  none  Labs/ tests ordered today include:   No orders of the defined types were placed in this encounter.    Disposition:   FU with Eithel Ryall C. Oval Linsey, MD, [facility] in 6 months.     Signed, Nicoli Nardozzi C. Oval Linsey, MD, [facility]  06/15/2019 5:32 PM    [place]

## 2019-06-15 NOTE — Patient Instructions (Signed)
Medication Instructions:  Your physician recommends that you continue on your current medications as directed. Please refer to the Current Medication list given to you today.  *If you need a refill on your cardiac medications before your next appointment, please call your pharmacy*  Lab Work: NONE  Testing/Procedures: NONE  Follow-Up: At [facility], you and your health needs are our priority.  As part of our continuing mission to provide you with exceptional heart care, we have created designated Provider Care Teams.  These Care Teams include your primary Cardiologist (physician) and Advanced Practice Providers (APPs -  Physician Assistants and Nurse Practitioners) who all work together to provide you with the care you need, when you need it.  Your next appointment:   6 months  You will receive a reminder letter in the mail two months in advance. If you don't receive a letter, please call our office to schedule the follow-up appointment.   The format for your next appointment:   Either In Person or Virtual  Provider:   You may see [name], MD or one of the following Advanced Practice Providers on your designated Care Team:    Kerin Ransom, PA-C  [place], [place]  Coletta Memos, [place]   Other Instructions   START WALKING DAILY   REST FOR 5 MINUTES BEFORE YOU TAKE YOU BLOOD PRESSURE

## 2019-06-16 ENCOUNTER — Ambulatory Visit: Payer: Self-pay

## 2019-06-16 ENCOUNTER — Ambulatory Visit: Payer: Medicare HMO

## 2019-06-16 DIAGNOSIS — N8182 Incompetence or weakening of pubocervical tissue: Secondary | ICD-10-CM | POA: Diagnosis not present

## 2019-06-16 DIAGNOSIS — M79672 Pain in left foot: Secondary | ICD-10-CM | POA: Diagnosis not present

## 2019-06-16 DIAGNOSIS — N952 Postmenopausal atrophic vaginitis: Secondary | ICD-10-CM | POA: Diagnosis not present

## 2019-06-18 DIAGNOSIS — R197 Diarrhea, unspecified: Secondary | ICD-10-CM | POA: Diagnosis not present

## 2019-06-23 DIAGNOSIS — M722 Plantar fascial fibromatosis: Secondary | ICD-10-CM | POA: Diagnosis not present

## 2019-06-23 DIAGNOSIS — M25572 Pain in left ankle and joints of left foot: Secondary | ICD-10-CM | POA: Diagnosis not present

## 2019-07-02 DIAGNOSIS — K219 Gastro-esophageal reflux disease without esophagitis: Secondary | ICD-10-CM | POA: Diagnosis not present

## 2019-07-02 DIAGNOSIS — Z0000 Encounter for general adult medical examination without abnormal findings: Secondary | ICD-10-CM | POA: Diagnosis not present

## 2019-07-02 DIAGNOSIS — E785 Hyperlipidemia, unspecified: Secondary | ICD-10-CM | POA: Diagnosis not present

## 2019-07-02 DIAGNOSIS — I5032 Chronic diastolic (congestive) heart failure: Secondary | ICD-10-CM | POA: Diagnosis not present

## 2019-07-02 DIAGNOSIS — I10 Essential (primary) hypertension: Secondary | ICD-10-CM | POA: Diagnosis not present

## 2019-07-02 DIAGNOSIS — I482 Chronic atrial fibrillation, unspecified: Secondary | ICD-10-CM | POA: Diagnosis not present

## 2019-07-02 DIAGNOSIS — Z7901 Long term (current) use of anticoagulants: Secondary | ICD-10-CM | POA: Diagnosis not present

## 2019-07-02 DIAGNOSIS — G2581 Restless legs syndrome: Secondary | ICD-10-CM | POA: Diagnosis not present

## 2019-07-02 DIAGNOSIS — F5101 Primary insomnia: Secondary | ICD-10-CM | POA: Diagnosis not present

## 2019-07-08 ENCOUNTER — Telehealth: Payer: Self-pay | Admitting: Cardiovascular Disease

## 2019-07-08 ENCOUNTER — Other Ambulatory Visit: Payer: Self-pay

## 2019-07-08 NOTE — Telephone Encounter (Signed)
Spoke with pt who states she has noticed weight gain over the past few days. She states she 'gained 2 lbs between Friday and Saturday' of last week, weighing 132 lbs, and her weight went back to 130 lbs on Monday. She then states she 'gained 2 lbs between Tues and Weds' and her current weight today is 132 lbs 2 oz. Pt denies edema to BLE but states she has sensation of 'rock in [her] chest' and that eating makes it worse. Pt states that she has digestive issues and was taken off of her ranitidine and put on Pepcid by Dr. Drema Dallas but this has not helped. She denies SOB today but states she had some SOB last night while watching TV which was unusual because if she is ever SOB it is on exertion. Pt states she also had episode of CP last night, but no CP today. Pt states she last checked BP yesterday. Morning BP reading before meds 178/90; evening BP reading 161/86. Pt confirmed BP meds. She states she was speaking with a nurse from another office about dietary intake of sodium and they discussed her drinking almond chocolate milk which contains 220 mg sodium. She states she drink 2-3 glasses a day, but has been drinking the milk for a year. She states she seldom eats take-out and has been eating about 1/4 cup of canned canned peas a day for several days. She states label on canned peas lists sodium content at 300 mg per 1/2 cup.  Pt advised to take additional tab of furosemide for weight gain and can hold chocolate milk and canned peas to see if this affects fluid retention. Pt states she urinates frequently so will take additional half tab of furosemide tomorrow. Advised to call back office if still having issues with weight gain and any other symptoms and will forward encounter to Dr. Oval Linsey for any additional recommendations. Pt melagreeable

## 2019-07-08 NOTE — Telephone Encounter (Signed)
Pt c/o swelling: STAT is pt has developed SOB within 24 hours  1) How much weight have you gained and in what time span? 3 pounds in a week  2) If swelling, where is the swelling located? Around ankles/ pressure on chest  3) Are you currently taking a fluid pill? yes  4) Are you currently SOB? yes  5) Do you have a log of your daily weights (if so, list)? Yes, gained a pound 11/14, lost a pound on Monday 07/05/19 and then gained 2 pounds since yesterday.   6) Have you gained 3 pounds in a day or 5 pounds in a week? no  7) Have you traveled recently? no

## 2019-07-08 NOTE — Patient Outreach (Signed)
[facility]) Care Management  07/08/2019  Kristen Whitehead 18-Oct-1922 [id number]   Telephone Assessment    Outreach attempt #1 to patient. Spoke with patient who reports that she is just waking up and does not feel the best this morning. She shares that mornings when she first wake up re the hardest on her as everything usually hurts and bother her. Patient reports he woke up with "a pain under boob" but pain has since resolved. She report that her weight ha been up and down for he past week. Patient has not weighed yet this morning. However,, she admits to varying weight gain off 3-6lbs last week but is back down to normal weight. She voices he is taking diuretic as ordered and adhering to fluid intake limit. Patient reports decreased appetite. She stets she is only eating one frozen meal/week. She states he has been eating a lot of chocolate na drinking lot of chocolate. RN CM discussed sodium content in foods she is consuming. She denies any edema but reports some occasional SOB at times. She states that she has some chest tightness and "feels full all the time." She reports she has been feeling this way she had to stop taking her Ranitidine. She tried alternative med but it was ineffective. RN CM discussed with pt sxs and importance of alerting MD. Patient voiced understanding and will call MD office to advise them and see what they recommend she does. Patient reports that she is switching to [facility] Medicare at the beginning of the year.      THN CM Care Plan Problem One     Most Recent Value  Care Plan Problem One  Knowledge deficit regarding mgmt and disease process of CHF  Role Documenting the Problem One  Care Management Telephonic Coordinator  Care Plan for Problem One  Active  THN Long Term Goal   Patient will hvae no CHF related hospitalizations over the next 90 days.  THN Long Term Goal Start Date  06/14/19  Interventions for Problem One Long Term Goal  RN CM  assessed for any acute issues or concerns. RN CM reviewed s/s of worsening condition and when to seek medical attention.  THN CM Short Term Goal #1   Patient will report decrease usage of high sodium frozen meals over the next 30 days.  THN CM Short Term Goal #1 Start Date  06/14/19  THN CM Short Term Goal #1 Met Date  07/08/19  THN CM Short Term Goal #2   Patient will report daily monitoring of weight and BP in the home over the next 30 days.  THN CM Short Term Goal #2 Start Date  06/14/19  Interventions for Short Term Goal #2  RN CM assesed for wgt montioring. RNCM educated pt on s/s of worsening condition and abnormal wgt gain to report to MD.      Plan: RN CM discussed with patient next outreach within a month. Patient gave verbal consent and in agreement with RN CM follow up timeframe. Patient aware that they may contact RN CM sooner for any issues or concerns.  [name], RN,BSN,CCM [place] Management Telephonic Care Management Coordinator Direct Phone: [phone] Toll Free: [phone] Fax: [phone]

## 2019-07-08 NOTE — Telephone Encounter (Signed)
Pt c/o swelling: STAT is pt has developed SOB within 24 hours  1. How much weight have you gained and in what time span? 3 pounds in a week  2. If swelling, where is the swelling located? Around ankles/ pressure on chest  3. Are you currently taking a fluid pill? yes  4. Are you currently SOB? yes  5. Do you have a log of your daily weights (if so, list)? Yes, gained a pound 11/14, lost a pound on Monday 07/05/19 and then gained 2 pounds since yesterday.   6. Have you gained 3 pounds in a day or 5 pounds in a week? no  7. Have you traveled recently? no

## 2019-07-08 NOTE — Telephone Encounter (Signed)
Attempted to call patient, no answer, unable to leave message.

## 2019-07-09 DIAGNOSIS — E785 Hyperlipidemia, unspecified: Secondary | ICD-10-CM | POA: Diagnosis not present

## 2019-07-09 DIAGNOSIS — E890 Postprocedural hypothyroidism: Secondary | ICD-10-CM | POA: Diagnosis not present

## 2019-07-09 DIAGNOSIS — H4089 Other specified glaucoma: Secondary | ICD-10-CM | POA: Diagnosis not present

## 2019-07-09 DIAGNOSIS — I482 Chronic atrial fibrillation, unspecified: Secondary | ICD-10-CM | POA: Diagnosis not present

## 2019-07-09 DIAGNOSIS — I10 Essential (primary) hypertension: Secondary | ICD-10-CM | POA: Diagnosis not present

## 2019-07-09 DIAGNOSIS — I5032 Chronic diastolic (congestive) heart failure: Secondary | ICD-10-CM | POA: Diagnosis not present

## 2019-07-09 DIAGNOSIS — M810 Age-related osteoporosis without current pathological fracture: Secondary | ICD-10-CM | POA: Diagnosis not present

## 2019-07-09 NOTE — Telephone Encounter (Signed)
Add amlodipine 2.5 mg daily

## 2019-07-10 DIAGNOSIS — R748 Abnormal levels of other serum enzymes: Secondary | ICD-10-CM | POA: Diagnosis not present

## 2019-07-10 DIAGNOSIS — R1011 Right upper quadrant pain: Secondary | ICD-10-CM | POA: Diagnosis not present

## 2019-07-10 DIAGNOSIS — K219 Gastro-esophageal reflux disease without esophagitis: Secondary | ICD-10-CM | POA: Diagnosis not present

## 2019-07-10 DIAGNOSIS — R197 Diarrhea, unspecified: Secondary | ICD-10-CM | POA: Diagnosis not present

## 2019-07-12 ENCOUNTER — Ambulatory Visit: Payer: Self-pay

## 2019-07-12 MED ORDER — AMLODIPINE BESYLATE 2.5 MG PO TABS: 2.5000 mg | ORAL_TABLET | Freq: Every day | ORAL | 1 refills | Status: DC

## 2019-07-12 NOTE — Addendum Note (Signed)
Addended by: Alvina Filbert B on: 07/12/2019 05:34 PM   Modules accepted: Orders

## 2019-07-12 NOTE — Telephone Encounter (Signed)
Advised patient, verbalized understanding  

## 2019-07-13 ENCOUNTER — Emergency Department (HOSPITAL_COMMUNITY): Payer: Medicare HMO

## 2019-07-13 ENCOUNTER — Other Ambulatory Visit: Payer: Self-pay

## 2019-07-13 ENCOUNTER — Encounter (HOSPITAL_COMMUNITY): Payer: Self-pay

## 2019-07-13 ENCOUNTER — Emergency Department (HOSPITAL_COMMUNITY)
Admission: EM | Admit: 2019-07-13 | Discharge: 2019-07-13 | Disposition: A | Discharge status: Home / Self Care | Payer: Medicare HMO | Attending: Emergency Medicine | Admitting: Emergency Medicine

## 2019-07-13 DIAGNOSIS — E039 Hypothyroidism, unspecified: Secondary | ICD-10-CM | POA: Diagnosis not present

## 2019-07-13 DIAGNOSIS — Z7901 Long term (current) use of anticoagulants: Secondary | ICD-10-CM | POA: Diagnosis not present

## 2019-07-13 DIAGNOSIS — N200 Calculus of kidney: Secondary | ICD-10-CM | POA: Diagnosis not present

## 2019-07-13 DIAGNOSIS — R198 Other specified symptoms and signs involving the digestive system and abdomen: Secondary | ICD-10-CM | POA: Diagnosis not present

## 2019-07-13 DIAGNOSIS — I110 Hypertensive heart disease with heart failure: Secondary | ICD-10-CM | POA: Diagnosis not present

## 2019-07-13 DIAGNOSIS — I5032 Chronic diastolic (congestive) heart failure: Secondary | ICD-10-CM | POA: Diagnosis not present

## 2019-07-13 DIAGNOSIS — R945 Abnormal results of liver function studies: Secondary | ICD-10-CM | POA: Diagnosis not present

## 2019-07-13 DIAGNOSIS — R1011 Right upper quadrant pain: Secondary | ICD-10-CM | POA: Diagnosis not present

## 2019-07-13 DIAGNOSIS — Z79899 Other long term (current) drug therapy: Secondary | ICD-10-CM | POA: Insufficient documentation

## 2019-07-13 DIAGNOSIS — I951 Orthostatic hypotension: Secondary | ICD-10-CM | POA: Diagnosis not present

## 2019-07-13 DIAGNOSIS — R1010 Upper abdominal pain, unspecified: Secondary | ICD-10-CM | POA: Diagnosis present

## 2019-07-13 DIAGNOSIS — R109 Unspecified abdominal pain: Secondary | ICD-10-CM | POA: Diagnosis not present

## 2019-07-13 DIAGNOSIS — K529 Noninfective gastroenteritis and colitis, unspecified: Secondary | ICD-10-CM | POA: Diagnosis not present

## 2019-07-13 LAB — LIPASE, BLOOD: Lipase: 38 U/L (ref 11–51)

## 2019-07-13 LAB — COMPREHENSIVE METABOLIC PANEL
ALT: 29 U/L (ref 0–44)
AST: 34 U/L (ref 15–41)
Albumin: 4.3 g/dL (ref 3.5–5.0)
Alkaline Phosphatase: 92 U/L (ref 38–126)
Anion gap: 11 (ref 5–15)
BUN: 13 mg/dL (ref 8–23)
CO2: 29 mmol/L (ref 22–32)
Calcium: 7.8 mg/dL — ABNORMAL LOW (ref 8.9–10.3)
Chloride: 98 mmol/L (ref 98–111)
Creatinine, Ser: 0.84 mg/dL (ref 0.44–1.00)
GFR calc Af Amer: >60 mL/min (ref >60)
GFR calc non Af Amer: 59 mL/min — ABNORMAL LOW (ref >60)
Glucose, Bld: 105 mg/dL — ABNORMAL HIGH (ref 70–99)
Potassium: 3.3 mmol/L — ABNORMAL LOW (ref 3.5–5.1)
Sodium: 138 mmol/L (ref 135–145)
Total Bilirubin: 1.4 mg/dL — ABNORMAL HIGH (ref 0.3–1.2)
Total Protein: 7.5 g/dL (ref 6.5–8.1)

## 2019-07-13 LAB — URINALYSIS, ROUTINE W REFLEX MICROSCOPIC
Bacteria, UA: NONE SEEN
Bilirubin Urine: NEGATIVE
Glucose, UA: NEGATIVE mg/dL
Hgb urine dipstick: NEGATIVE
Ketones, ur: 5 mg/dL — AB
Nitrite: NEGATIVE
Protein, ur: NEGATIVE mg/dL
Specific Gravity, Urine: 1.004 — ABNORMAL LOW (ref 1.005–1.030)
pH: 7 (ref 5.0–8.0)

## 2019-07-13 LAB — CBC
HCT: 46.2 % — ABNORMAL HIGH (ref 36.0–46.0)
Hemoglobin: 15.2 g/dL — ABNORMAL HIGH (ref 12.0–15.0)
MCH: 31.2 pg (ref 26.0–34.0)
MCHC: 32.9 g/dL (ref 30.0–36.0)
MCV: 94.9 fL (ref 80.0–100.0)
Platelets: 250 10*3/uL (ref 150–400)
RBC: 4.87 MIL/uL (ref 3.87–5.11)
RDW: 14.5 % (ref 11.5–15.5)
WBC: 6.2 10*3/uL (ref 4.0–10.5)
nRBC: 0 % (ref 0.0–0.2)

## 2019-07-13 MED ORDER — METRONIDAZOLE 500 MG PO TABS: 500.0000 mg | ORAL_TABLET | Freq: Two times a day (BID) | ORAL | 0 refills | Status: DC

## 2019-07-13 MED ORDER — SODIUM CHLORIDE 0.9% FLUSH: 3.0000 mL | Freq: Once | INTRAVENOUS | Status: DC

## 2019-07-13 MED ORDER — IOHEXOL 300 MG/ML  SOLN
100.0000 mL | Freq: Once | INTRAMUSCULAR | Status: AC | PRN
  Administered 2019-07-13: 19:00:00 100 mL via INTRAVENOUS

## 2019-07-13 MED ORDER — CIPROFLOXACIN HCL 500 MG PO TABS: 500.0000 mg | ORAL_TABLET | Freq: Two times a day (BID) | ORAL | 0 refills | Status: DC

## 2019-07-13 NOTE — ED Notes (Signed)
Pt provided with a beverage and snack

## 2019-07-13 NOTE — ED Provider Notes (Signed)
[facility] DEPT Provider Note   CSN: [id number] Arrival date & time: 07/13/19  1418     History   Chief Complaint Chief Complaint  Patient presents with   Abdominal Pain    HPI Kristen Whitehead is a 85 y.o. female.     HPI 85 year old female comes in a chief complaint of abdominal pain. She has history of chronic A. fib, CHF. Patient reports that she been having abdominal pain for the last several days.  Pain has been located in the mid and upper abdomen and she has had loose bowel movements associated with it.  She is also having some weakness and was seen by her PCP today.  On exam they found that patient had possible mass and advised her to come to the ER for further evaluation.  Patient does not smoke and denies any history of cancer, weight loss.  She has not had any bloody stools.  Review of system is negative for fevers, chills.  Past Medical History:  Diagnosis Date   Bladder prolapse, female, acquired    Cancer [facility])    thyroid   Chest pain    no known ischemic heart disease; negative Myoview July 2013. EF 75% with no ischemia.    Chronic anticoagulation    Chronic atrial fibrillation (HCC)    managed with rate control and coumadin   Chronic diastolic heart failure (HCC)    GERD (gastroesophageal reflux disease)    Heart disease    History of congenital mitral regurgitation    mild   History of mitral valve prolapse 06/15/2008   a. echo 1/14: mild LVH, EF 60-65%, mild MR, mild to mod BAE, PASP 35   Hypercholesterolemia    Hypertension    Hypothyroidism     Patient Active Problem List   Diagnosis Date Noted   Chronic anticoagulation 11/18/2016   Weakness 11/10/2014   Chest pain 11/10/2014   Chronic diastolic heart failure ([place]) 02/01/2014   Dyspnea 02/01/2014   Encounter for therapeutic drug monitoring 09/16/2013   Chest pain at rest 03/12/2012   Cough 09/16/2011   Fatigue 04/08/2011    Essential hypertension 02/11/2011   Mitral regurgitation 02/11/2011   Hypothyroidism 02/11/2011   Hypercholesterolemia 02/11/2011   Atrial fibrillation ([place]) 12/03/2010    Past Surgical History:  Procedure Laterality Date   ABDOMINAL HYSTERECTOMY     CATARACT EXTRACTION     CHOLECYSTECTOMY     Thyroidectomy       OB History   No obstetric history on file.      Home Medications    Prior to Admission medications   Medication Sig Start Date End Date Taking? Authorizing Provider  apixaban (ELIQUIS) 5 MG TABS tablet Take 1 tablet (5 mg total) by mouth 2 (two) times daily. 06/02/19  Yes [name], MD  atenolol (TENORMIN) 50 MG tablet Take 1 tablet (50 mg total) by mouth 2 (two) times daily. 10/30/18 07/13/19 Yes [name], MD  Calcium Carbonate-Vitamin D (CALCIUM 600+D PO) Take 1 tablet by mouth 2 (two) times daily.   Yes [provider]  Cholecalciferol (VITAMIN D) 2000 UNITS tablet Take 4,000 Units by mouth daily.    Yes [provider]  dorzolamide (TRUSOPT) 2 % ophthalmic solution Place 1 drop into the left eye 2 (two) times daily.   Yes [provider]  furosemide (LASIX) 40 MG tablet Take 20-40 mg by mouth 2 (two) times daily. 40mg   in the morning, 20mg  in afternoon   Yes  [provider]  irbesartan (AVAPRO) 300 MG tablet Take 1/2 tablet twice a day Patient taking differently: Take 150 mg by mouth 2 (two) times daily.  04/23/19  Yes [name], MD  isosorbide mononitrate (IMDUR) 30 MG 24 hr tablet Take 30 mg by mouth 2 (two) times daily.   Yes [provider]  latanoprost (XALATAN) 0.005 % ophthalmic solution Place 1 drop into both eyes at bedtime.  02/23/13  Yes [provider]  levothyroxine (SYNTHROID, LEVOTHROID) 100 MCG tablet Take 100 mcg by mouth daily.  04/20/15  Yes [provider]  MAGNESIUM OXIDE 400 PO Take 800 mg by mouth daily.   Yes [provider]  Multiple  Vitamins-Minerals (CENTRUM SILVER PO) Take 1 tablet by mouth daily.   Yes [provider]  Multiple Vitamins-Minerals (PRESERVISION AREDS 2 PO) Take 1 capsule by mouth 2 (two) times daily.    Yes [provider]  potassium chloride (K-DUR) 10 MEQ tablet Take 1 tablet (10 mEq total) by mouth daily. 12/07/18  Yes [name], MD  pramipexole (MIRAPEX) 0.125 MG tablet Take 1 tablet (0.125 mg total) by mouth every evening. 03/24/19  Yes Star Age, MD  PREMARIN vaginal cream Place 2 g vaginally once a week. Uses on Sunday and wednesday 03/25/18  Yes [provider]  Probiotic Product (PROBIOTIC DAILY PO) Take 1 tablet by mouth daily.   Yes [provider]  temazepam (RESTORIL) 15 MG capsule Take 15 mg by mouth at bedtime.   Yes [provider]  amLODipine (NORVASC) 2.5 MG tablet Take 1 tablet (2.5 mg total) by mouth daily. 07/12/19 10/10/19  [name], MD  metoCLOPramide (REGLAN) 10 MG tablet Take 10 mg by mouth 4 (four) times daily. 07/10/19   [provider]  nitroGLYCERIN (NITROSTAT) 0.4 MG SL tablet DISSOLVE 1 TABLET UNDER THE TONGUE EVERY 5 MINUTES FOR 3 DOSES AS NEEDED  FOR  CHEST  PAIN 12/23/18   [name], MD  omeprazole (PRILOSEC) 40 MG capsule Take 40 mg by mouth daily. 07/10/19   [provider]    Family History Family History  Problem Relation Age of Onset   Stroke Mother    Stroke Father    Heart attack Neg Hx    Heart disease Neg Hx     Social History Social History   Tobacco Use   Smoking status: Never Smoker   Smokeless tobacco: Never Used  Substance Use Topics   Alcohol use: No   Drug use: No     Allergies   Azithromycin, Compazine [prochlorperazine edisylate], Demerol, Dolophine [methadone], Ebastine, Erythromycin, Ibuprofen, Statins, Tetanus toxoids, and Tetracyclines & related   Review of Systems Review of Systems  Constitutional: Positive for activity change and fatigue.    Gastrointestinal: Positive for abdominal pain and diarrhea.  Genitourinary: Negative for dysuria.  Allergic/Immunologic: Negative for immunocompromised state.  Hematological: Does not bruise/bleed easily.  All other systems reviewed and are negative.    Physical Exam Updated Vital Signs BP (!) 148/81 (BP Location: Left Arm)    Pulse 67    Temp 98.6 F (37 C) (Oral)    Resp 19    Ht 4\' 11"  (1.499 m)    Wt 57.7 kg    SpO2 96%    BMI 25.69 kg/m   Physical Exam Vitals signs and nursing note reviewed.  Constitutional:      Appearance: She is well-developed.  HENT:     Head: Normocephalic and atraumatic.  Neck:  Musculoskeletal: Normal range of motion and neck supple.  Cardiovascular:     Rate and Rhythm: Normal rate.  Pulmonary:     Effort: Pulmonary effort is normal.  Abdominal:     General: Bowel sounds are normal.     Tenderness: There is generalized abdominal tenderness and tenderness in the epigastric area. There is no guarding or rebound.  Skin:    General: Skin is warm and dry.  Neurological:     Mental Status: She is alert and oriented to person, place, and time.      ED Treatments / Results  Labs (all labs ordered are listed, but only abnormal results are displayed) Labs Reviewed  COMPREHENSIVE METABOLIC PANEL - Abnormal; Notable for the following components:      Result Value   Potassium 3.3 (*)    Glucose, Bld 105 (*)    Calcium 7.8 (*)    Total Bilirubin 1.4 (*)    GFR calc non Af Amer 59 (*)    All other components within normal limits  CBC - Abnormal; Notable for the following components:   Hemoglobin 15.2 (*)    HCT 46.2 (*)    All other components within normal limits  URINALYSIS, ROUTINE W REFLEX MICROSCOPIC - Abnormal; Notable for the following components:   Color, Urine STRAW (*)    Specific Gravity, Urine 1.004 (*)    Ketones, ur 5 (*)    Leukocytes,Ua LARGE (*)    All other components within normal limits  LIPASE, BLOOD     EKG None  Radiology Ct Abdomen Pelvis W Contrast  Result Date: 07/13/2019 CLINICAL DATA:  Generalized abdominal pain. EXAM: CT ABDOMEN AND PELVIS WITH CONTRAST TECHNIQUE: Multidetector CT imaging of the abdomen and pelvis was performed using the standard protocol following bolus administration of intravenous contrast. CONTRAST:  159mL OMNIPAQUE IOHEXOL 300 MG/ML  SOLN COMPARISON:  August 01, 2014 FINDINGS: Lower chest: The lung bases are clear. The heart size is normal. Hepatobiliary: There is a 2.4 cm left hepatic lobe cyst. Status post cholecystectomy.There is no biliary ductal dilation. Pancreas: Normal contours without ductal dilatation. No peripancreatic fluid collection. Spleen: No splenic laceration or hematoma. Adrenals/Urinary Tract: --Adrenal glands: No adrenal hemorrhage. --Right kidney/ureter: There is a nonobstructing stone in the lower pole the right kidney. There is a prominent extrarenal pelvis on the right. Multiple small cystic nodules are noted in the right renal cortex. --Left kidney/ureter: No hydronephrosis or perinephric hematoma. --Urinary bladder: Unremarkable. Stomach/Bowel: --Stomach/Duodenum: No hiatal hernia or other gastric abnormality. Normal duodenal course and caliber. --Small bowel: No dilatation or inflammation. --Colon: There is some wall thickening throughout the sigmoid colon with severe sigmoid diverticulosis. There is no definite CT evidence for sigmoid diverticulitis. There are air-fluid levels in the colon consistent with liquid stool. --Appendix: Normal. Vascular/Lymphatic: Atherosclerotic calcification is present within the non-aneurysmal abdominal aorta, without hemodynamically significant stenosis. --No retroperitoneal lymphadenopathy. --No mesenteric lymphadenopathy. --No pelvic or inguinal lymphadenopathy. Reproductive: A pessary is in place. The patient is status post prior hysterectomy. Other: No ascites or free air. The abdominal wall is normal.  Musculoskeletal. There are degenerative changes throughout the lumbar spine. There is a degenerative grade 1 anterolisthesis of L4 on L5. IMPRESSION: 1. Wall thickening throughout the sigmoid colon with severe sigmoid diverticulosis. There is no definite CT evidence for sigmoid diverticulitis. However, there are air-fluid levels in the colon consistent with liquid stool. 2. Nonobstructing right nephrolithiasis. Aortic Atherosclerosis (ICD10-I70.0). Electronically Signed   By: Constance Holster M.D.   On:  07/13/2019 19:48   Dg Abdomen Acute W/chest  Result Date: 07/13/2019 CLINICAL DATA:  Pain EXAM: DG ABDOMEN ACUTE W/ 1V CHEST COMPARISON:  03/21/2013 FINDINGS: The heart size is stable from prior study. Aortic calcifications are noted. There is no pneumothorax. No large pleural effusion. There is no focal infiltrate. The bowel gas pattern is nonobstructive. The stool burden is average. There are phleboliths in the patient's pelvis. The patient is likely status post prior cholecystectomy. IMPRESSION: 1. Stable chest x-ray. No acute cardiopulmonary process. 2. Normal bowel gas pattern. No evidence of bowel obstruction. Electronically Signed   By: Constance Holster M.D.   On: 07/13/2019 16:25    Procedures Procedures (including critical care time)  Medications Ordered in ED Medications  sodium chloride flush (NS) 0.9 % injection 3 mL (has no administration in time range)  iohexol (OMNIPAQUE) 300 MG/ML solution 100 mL (100 mLs Intravenous Contrast Given 07/13/19 1919)     Initial Impression / Assessment and Plan / ED Course  I have reviewed the triage vital signs and the nursing notes.  Pertinent labs & imaging results that were available during my care of the patient were reviewed by me and considered in my medical decision making (see chart for details).  Clinical Course as of Jul 12 2034  Tue Jul 13, 2019  2034 CT scan is showing colonic wall edema and severe constipation.  She could be  having colitis.  We will give her Cipro and Flagyl and have her follow-up with PCP.   [AN]    Clinical Course User Index [AN] Varney Biles, MD       DDx includes: Pancreatitis Hepatobiliary pathology including cholecystitis Gastritis/PUD SBO Hernia  Patient comes in a chief complaint of abdominal pain. She has been having epigastric abdominal discomfort on and off for his last several months.  She is also been having some diarrhea-like symptoms. Patient was sent here by her PCP because she had a palpable mass.  CT scan ordered.  Our exam is reassuring but there could be a tumor.    Final Clinical Impressions(s) / ED Diagnoses   Final diagnoses:  Colitis    ED Discharge Orders    None       Varney Biles, MD 07/13/19 2035

## 2019-07-13 NOTE — ED Triage Notes (Signed)
Patient c/o mid and upper abdominal pain x months and diarrhea x 3 years. Patient c/o weakness and states that her PCp felt something in her stomach and told her to come to the Ed.

## 2019-07-13 NOTE — Discharge Instructions (Addendum)
We saw in the ER for abdominal pain. Our CT scan results show that you have colitis. We do not see any other abnormalities.  Please take the antibiotics prescribed and follow-up with your PCP in 5 to 7 days. We do not appreciate any tumors, hernias or masses on the CT scan.  If your symptoms continue that you might need further evaluation for your symptoms.

## 2019-07-13 NOTE — ED Notes (Signed)
Patient transported to CT 

## 2019-07-19 ENCOUNTER — Telehealth: Payer: Self-pay | Admitting: Cardiovascular Disease

## 2019-07-19 NOTE — Telephone Encounter (Signed)
Pt c/o swelling: STAT is pt has developed SOB within 24 hours  1) How much weight have you gained and in what time span? 4lbs in 4 days   2) If swelling, where is the swelling located? No   3) Are you currently taking a fluid pill? yes  4) Are you currently SOB? yes  5) Do you have a log of your daily weights (if so, list)?   6) Have you gained 3 pounds in a day or 5 pounds in a week? No   7) Have you traveled recently? No  Patient states she is not eating, she has been having diarrhea which her PCP is taking care of, she states is very weak. She wants to know if she should increase her fluid pill.

## 2019-07-19 NOTE — Telephone Encounter (Signed)
Pt called to report a 4 lb weight increase in 5 days. She denies swelling and report SOB is within her norm. Pt state she is concerned as to why her weight has increased since she hasn't been able to eat and has had loose stool the past few days ( pcp is aware). Pt report she is taking her lasix as prescribed and inquiring if MD recommends she increase dose.  Will route to MD for recommendations.

## 2019-07-20 DIAGNOSIS — K529 Noninfective gastroenteritis and colitis, unspecified: Secondary | ICD-10-CM | POA: Diagnosis not present

## 2019-07-20 NOTE — Telephone Encounter (Signed)
Patient called today to check on status of call.  

## 2019-07-20 NOTE — Telephone Encounter (Signed)
Spoke with patient and her weight is down over 2 pounds today. She continues to have fluctuating weights of 2-3 pounds in 24 hours. Patient stated her diarrhea has improved on antibiotics. Scheduled appointment with A Duke PA in office 12/3, patient agreeable to visit

## 2019-07-21 DIAGNOSIS — M810 Age-related osteoporosis without current pathological fracture: Secondary | ICD-10-CM | POA: Diagnosis not present

## 2019-07-21 DIAGNOSIS — H4089 Other specified glaucoma: Secondary | ICD-10-CM | POA: Diagnosis not present

## 2019-07-21 DIAGNOSIS — E890 Postprocedural hypothyroidism: Secondary | ICD-10-CM | POA: Diagnosis not present

## 2019-07-21 DIAGNOSIS — I10 Essential (primary) hypertension: Secondary | ICD-10-CM | POA: Diagnosis not present

## 2019-07-21 DIAGNOSIS — E785 Hyperlipidemia, unspecified: Secondary | ICD-10-CM | POA: Diagnosis not present

## 2019-07-21 DIAGNOSIS — I482 Chronic atrial fibrillation, unspecified: Secondary | ICD-10-CM | POA: Diagnosis not present

## 2019-07-21 DIAGNOSIS — I5032 Chronic diastolic (congestive) heart failure: Secondary | ICD-10-CM | POA: Diagnosis not present

## 2019-07-21 NOTE — Progress Notes (Signed)
Cardiology Office Note:    Date:  07/22/2019   ID:  Kristen Whitehead, DOB 1922-09-10, MRN [id number]  PCP:  Leighton Ruff, MD  Cardiologist:  [name], MD   Referring MD: Leighton Ruff, MD   Chief Complaint  Patient presents with  . Hospitalization Follow-up    History of Present Illness:    Kristen Whitehead is a 85 y.o. female with a hx of chronic atrial fibrillation on Eliquis, hypertension, mitral regurgitation, chronic diastolic heart failure, and hyperlipidemia.  She has a history of episodic chest pain and was referred for stress testing but has declined.  She has not tolerated Lexiscan well in the past.  Stress test in 2013 was negative for ischemia.  She reports having "heart attacks" that occur when she overexerts herself.  Imdur was titrated to 90 mg daily in 2017.  She worked with Beckie Busing, DNP in 2018 to titrate her Lasix and antihypertensives.  Clinic on 06/15/2019 by Dr. Oval Linsey.  At that time she had been walking less after a fall 3 months prior.  Her blood pressure was poorly controlled at that visit but she had been eating a lot of frozen meals and was not resting for 5 minutes prior to taking her pressure.  She is continued on atenolol, furosemide, irbesartan, and Imdur.  Her chest pain had resolved.  On 07/08/2019, patient called our office with fluctuations in her weight and chest pain.  Dr. Oval Linsey added 2.5 mg amlodipine to her regimen.  She called our office on 07/19/2019 with a with 4 pound weight increase in 5 days.  She did not have swelling or shortness of breath.    Of note she was recently seen in the ER on 07/13/2019 for abdominal mass appreciated on exam at her PCPs office.  CT scan with diverticulosis and constipation.    She presents today for follow up. She complains of feeling sick after she eats, cough, and diarrhea. She felt well on ranitidine, which had solved all of her problems. She was switched to prilosec. She is not  sure what medications she is taking. She reports very poor PO intake of solids and liquid because she has abdominal pain. She reports a history of gluten intolerance. She only eats twice per day. She drinks throughout the day. She has been taking 40 mg lasix qAM and 20 mg qPM. She reports that her weight fluctuates wildly. However, in review of her personal notebook, her weight only fluctuates by 1-2 lbs at a time. Her pressure readings are quite labile. She reports taking her pressure prior to medications and [facility] at [facility]. She started taking 2.5 mg amlodipine.  She reports DOE and exhaustion, which I think are related to her poor PO intake. She is euvolemic on exam.    Past Medical History:  Diagnosis Date  . Bladder prolapse, female, acquired   . Cancer ([place])    thyroid  . Chest pain    no known ischemic heart disease; negative Myoview July 2013. EF 75% with no ischemia.   . Chronic anticoagulation   . Chronic atrial fibrillation (HCC)    managed with rate control and coumadin  . Chronic diastolic heart failure ([place])   . GERD (gastroesophageal reflux disease)   . Heart disease   . History of congenital mitral regurgitation    mild  . History of mitral valve prolapse 06/15/2008   a. echo 1/14: mild LVH, EF 60-65%, mild MR, mild to mod BAE, PASP 35  . Hypercholesterolemia   .  Hypertension   . Hypothyroidism     Past Surgical History:  Procedure Laterality Date  . ABDOMINAL HYSTERECTOMY    . CATARACT EXTRACTION    . CHOLECYSTECTOMY    . Thyroidectomy      Current Medications: Current Meds  Medication Sig  . amLODipine (NORVASC) 2.5 MG tablet Take 2.5 mg by mouth daily.  Marland Kitchen apixaban (ELIQUIS) 5 MG TABS tablet Take 1 tablet (5 mg total) by mouth 2 (two) times daily.  Marland Kitchen atenolol (TENORMIN) 50 MG tablet Take 1 tablet (50 mg total) by mouth 2 (two) times daily.  . Calcium Carbonate-Vitamin D (CALCIUM 600+D PO) Take 1 tablet by mouth 2 (two) times daily.  . Cholecalciferol (VITAMIN D)  2000 UNITS tablet Take 4,000 Units by mouth daily.   . furosemide (LASIX) 40 MG tablet Take 20-40 mg by mouth 2 (two) times daily. 40mg   in the morning, 20mg  in afternoon  . irbesartan (AVAPRO) 300 MG tablet Take 1/2 tablet twice a day (Patient taking differently: Take 150 mg by mouth 2 (two) times daily. )  . isosorbide mononitrate (IMDUR) 30 MG 24 hr tablet Take 30 mg by mouth daily.  Marland Kitchen latanoprost (XALATAN) 0.005 % ophthalmic solution Place 1 drop into both eyes at bedtime.   Marland Kitchen levothyroxine (SYNTHROID, LEVOTHROID) 100 MCG tablet Take 100 mcg by mouth daily.   Marland Kitchen MAGNESIUM OXIDE 400 PO Take 800 mg by mouth daily.  . Multiple Vitamins-Minerals (CENTRUM SILVER PO) Take 1 tablet by mouth daily.  . Multiple Vitamins-Minerals (PRESERVISION AREDS 2 PO) Take 1 capsule by mouth 2 (two) times daily.   . nitroGLYCERIN (NITROSTAT) 0.4 MG SL tablet DISSOLVE 1 TABLET UNDER THE TONGUE EVERY 5 MINUTES FOR 3 DOSES AS NEEDED  FOR  CHEST  PAIN  . potassium chloride (K-DUR) 10 MEQ tablet Take 1 tablet (10 mEq total) by mouth daily. (Patient taking differently: Take 15 mEq by mouth daily. )  . pramipexole (MIRAPEX) 0.125 MG tablet Take 1 tablet (0.125 mg total) by mouth every evening.  Marland Kitchen PREMARIN vaginal cream Place 2 g vaginally once a week. Uses on Sunday and wednesday  . Probiotic Product (PROBIOTIC DAILY PO) Take 1 tablet by mouth daily.  . temazepam (RESTORIL) 15 MG capsule Take 15 mg by mouth at bedtime.     Allergies:   Azithromycin, Compazine [prochlorperazine edisylate], Demerol, Dolophine [methadone], Ebastine, Erythromycin, Ibuprofen, Statins, Tetanus toxoids, and Tetracyclines & related   Social History   Socioeconomic History  . Marital status: Widowed    Spouse name: Not on file  . Number of children: 3  . Years of education: BA  . Highest education level: Not on file  Occupational History    Employer: RETIRED  Social Needs  . Financial resource strain: Not on file  . Food insecurity     Worry: Not on file    Inability: Not on file  . Transportation needs    Medical: Yes    Non-medical: Yes  Tobacco Use  . Smoking status: Never Smoker  . Smokeless tobacco: Never Used  Substance and Sexual Activity  . Alcohol use: No  . Drug use: No  . Sexual activity: Never  Lifestyle  . Physical activity    Days per week: Not on file    Minutes per session: Not on file  . Stress: Not on file  Relationships  . Social Herbalist on phone: Not on file    Gets together: Not on file    Attends religious  service: Not on file    Active member of club or organization: Not on file    Attends meetings of clubs or organizations: Not on file    Relationship status: Not on file  Other Topics Concern  . Not on file  Social History Narrative   Pt lives at home alone.   Caffeine Use: quit 5yrs ago     Family History: The patient's family history includes Stroke in her father and mother. There is no history of Heart attack or Heart disease.  ROS:   Please see the history of present illness.     All other systems reviewed and are negative.  EKGs/Labs/Other Studies Reviewed:    The following studies were reviewed today:  Echo 01/13/18: Study Conclusions  - Left ventricle: The cavity size was normal. Wall thickness was   normal. Systolic function was normal. The estimated ejection   fraction was in the range of 60% to 65%. Indeterminant diastolic   function (atrial fibrillation). Wall motion was normal; there   were no regional wall motion abnormalities. - Aortic valve: Trileaflet; moderately calcified leaflets.   Sclerosis without stenosis. There was trivial regurgitation. - Mitral valve: Moderately calcified annulus. There was trivial   regurgitation. - Left atrium: The atrium was moderately dilated. - Right ventricle: The cavity size was normal. Systolic function   was normal. - Right atrium: The atrium was moderately dilated. - Tricuspid valve: Peak RV-RA  gradient (S): 37 mm Hg. - Pulmonary arteries: PA peak pressure: 52 mm Hg (S). - Systemic veins: IVC measured 2.5 cm with < 50% respirophasic   variation, suggesting RA pressure 15 mmHg.  Impressions:  - The patient was in atrial fibrillation. Normal LV size with EF   60-65%. Normal RV size and systolic function. Moderate pulmonary   hypertension. Moderate biatrial enlargement.   EKG:  EKG is  ordered today.  The ekg ordered today demonstrates atrial fibrillation with ventricular rate 64  Recent Labs: 01/05/2019: NT-Pro BNP 2,323 07/13/2019: ALT 29; BUN 13; Creatinine, Ser 0.84; Hemoglobin 15.2; Platelets 250; Potassium 3.3; Sodium 138  Recent Lipid Panel    Component Value Date/Time   CHOL 211 (H) 11/04/2012 1524   TRIG 201.0 (H) 11/04/2012 1524   HDL 66.80 11/04/2012 1524   CHOLHDL 3 11/04/2012 1524   VLDL 40.2 (H) 11/04/2012 1524   LDLCALC 109 (H) 04/16/2012 1043   LDLDIRECT 98.8 11/04/2012 1524    Physical Exam:    VS:  BP 140/72   Pulse 64   Ht 4\' 11"  (1.499 m)   Wt 134 lb (60.8 kg)   SpO2 96%   BMI 27.06 kg/m     Wt Readings from Last 3 Encounters:  07/22/19 134 lb (60.8 kg)  07/13/19 127 lb 3.2 oz (57.7 kg)  06/15/19 135 lb (61.2 kg)     GEN: elderly female, Well nourished, well developed in no acute distress HEENT: Normal NECK: No JVD; No carotid bruits LYMPHATICS: No lymphadenopathy CARDIAC: irregular rhythm, regular rate, no murmurs, rubs, gallops RESPIRATORY:  Clear to auscultation without rales, wheezing or rhonchi  ABDOMEN: Soft, non-tender, non-distended MUSCULOSKELETAL:  No edema; No deformity  SKIN: Warm and dry NEUROLOGIC:  Alert and oriented x 3 PSYCHIATRIC:  Normal affect   ASSESSMENT:    1. Essential hypertension   2. Chronic diastolic heart failure ([place])   3. Atrial fibrillation, unspecified type ([place])   4. Hypercholesterolemia   5. Chronic anticoagulation    PLAN:    In order of problems listed  above:  Hypertension - her  pressure is very labile: 123456 systolic after medications - she reports having trouble regulating her pressure in the past - 2.5 mg amlodipine, irbesartan, imdur   Chronic atrial fibrillation Chronic anticoagulation This patients CHA2DS2-VASc Score and unadjusted Ischemic Stroke Rate (% per year) is equal to 4.8 % stroke rate/year from a score of 4 (female, age, HTN) - stable   Chronic diastolic heart failure - continue lasix 40/20 mg - her weight appears stable to me - she does not appear hypervolemic on exam - she was hypokalemic in the ER - I will increase her potassium to 15 mEq total - repeat BMP in 2 weeks   Hyperlipidemia - LDL 68 06/2018  I think she is stable from a cardiac perspective. I think her weakness and fatigue are related to her poor PO intake and GI issues. I have given her examples of ways to increase her protein intake. I will have her follow up with Dr. Oval Linsey is 1 month.  Medication Adjustments/Labs and Tests Ordered: Current medicines are reviewed at length with the patient today.  Concerns regarding medicines are outlined above.  Orders Placed This Encounter  Procedures  . Basic metabolic panel  . EKG 12-Lead   No orders of the defined types were placed in this encounter.   Signed, Ledora Bottcher, PA  07/22/2019 12:07 PM    [facility]

## 2019-07-22 ENCOUNTER — Other Ambulatory Visit: Payer: Self-pay

## 2019-07-22 ENCOUNTER — Ambulatory Visit (INDEPENDENT_AMBULATORY_CARE_PROVIDER_SITE_OTHER): Payer: Medicare HMO | Admitting: Physician Assistant

## 2019-07-22 ENCOUNTER — Encounter: Payer: Self-pay | Admitting: Physician Assistant

## 2019-07-22 VITALS — BP 140/72 | HR 64 | Ht 59.0 in | Wt 134.0 lb

## 2019-07-22 DIAGNOSIS — I4891 Unspecified atrial fibrillation: Secondary | ICD-10-CM | POA: Diagnosis not present

## 2019-07-22 DIAGNOSIS — Z7901 Long term (current) use of anticoagulants: Secondary | ICD-10-CM | POA: Diagnosis not present

## 2019-07-22 DIAGNOSIS — E7800 Pure hypercholesterolemia, unspecified: Secondary | ICD-10-CM | POA: Diagnosis not present

## 2019-07-22 DIAGNOSIS — I5032 Chronic diastolic (congestive) heart failure: Secondary | ICD-10-CM

## 2019-07-22 DIAGNOSIS — I10 Essential (primary) hypertension: Secondary | ICD-10-CM

## 2019-07-22 NOTE — Patient Instructions (Signed)
Medication Instructions:   Take potassium 15 mg (1 & 1/2 tab) daily.  *If you need a refill on your cardiac medications before your next appointment, please call your pharmacy*  Lab Work: Your physician recommends that you return for lab work in 2 weeks (around Thursday, 12/17): BMET  If you have labs (blood work) drawn today and your tests are completely normal, you will receive your results only by: Marland Kitchen MyChart Message (if you have MyChart) OR . A paper copy in the mail If you have any lab test that is abnormal or we need to change your treatment, we will call you to review the results.  Follow-Up: At [facility], you and your health needs are our priority.  As part of our continuing mission to provide you with exceptional heart care, we have created designated Provider Care Teams.  These Care Teams include your primary Cardiologist (physician) and Advanced Practice Providers (APPs -  Physician Assistants and Nurse Practitioners) who all work together to provide you with the care you need, when you need it.  Your next appointment:   Wednesday 08/18/19 at 3:20 PM  The format for your next appointment:   In Person  Provider:   [name], MD

## 2019-07-28 ENCOUNTER — Other Ambulatory Visit: Payer: Self-pay | Admitting: Cardiovascular Disease

## 2019-07-31 DIAGNOSIS — J45909 Unspecified asthma, uncomplicated: Secondary | ICD-10-CM | POA: Diagnosis not present

## 2019-07-31 DIAGNOSIS — I482 Chronic atrial fibrillation, unspecified: Secondary | ICD-10-CM | POA: Diagnosis not present

## 2019-07-31 DIAGNOSIS — I5032 Chronic diastolic (congestive) heart failure: Secondary | ICD-10-CM | POA: Diagnosis not present

## 2019-07-31 DIAGNOSIS — I110 Hypertensive heart disease with heart failure: Secondary | ICD-10-CM | POA: Diagnosis not present

## 2019-07-31 DIAGNOSIS — G629 Polyneuropathy, unspecified: Secondary | ICD-10-CM | POA: Diagnosis not present

## 2019-07-31 DIAGNOSIS — M1991 Primary osteoarthritis, unspecified site: Secondary | ICD-10-CM | POA: Diagnosis not present

## 2019-07-31 DIAGNOSIS — M810 Age-related osteoporosis without current pathological fracture: Secondary | ICD-10-CM | POA: Diagnosis not present

## 2019-07-31 DIAGNOSIS — K529 Noninfective gastroenteritis and colitis, unspecified: Secondary | ICD-10-CM | POA: Diagnosis not present

## 2019-07-31 DIAGNOSIS — K5790 Diverticulosis of intestine, part unspecified, without perforation or abscess without bleeding: Secondary | ICD-10-CM | POA: Diagnosis not present

## 2019-08-03 DIAGNOSIS — M1991 Primary osteoarthritis, unspecified site: Secondary | ICD-10-CM | POA: Diagnosis not present

## 2019-08-03 DIAGNOSIS — I482 Chronic atrial fibrillation, unspecified: Secondary | ICD-10-CM | POA: Diagnosis not present

## 2019-08-03 DIAGNOSIS — K5790 Diverticulosis of intestine, part unspecified, without perforation or abscess without bleeding: Secondary | ICD-10-CM | POA: Diagnosis not present

## 2019-08-03 DIAGNOSIS — K529 Noninfective gastroenteritis and colitis, unspecified: Secondary | ICD-10-CM | POA: Diagnosis not present

## 2019-08-03 DIAGNOSIS — J45909 Unspecified asthma, uncomplicated: Secondary | ICD-10-CM | POA: Diagnosis not present

## 2019-08-03 DIAGNOSIS — G629 Polyneuropathy, unspecified: Secondary | ICD-10-CM | POA: Diagnosis not present

## 2019-08-03 DIAGNOSIS — M810 Age-related osteoporosis without current pathological fracture: Secondary | ICD-10-CM | POA: Diagnosis not present

## 2019-08-03 DIAGNOSIS — I5032 Chronic diastolic (congestive) heart failure: Secondary | ICD-10-CM | POA: Diagnosis not present

## 2019-08-03 DIAGNOSIS — I110 Hypertensive heart disease with heart failure: Secondary | ICD-10-CM | POA: Diagnosis not present

## 2019-08-04 DIAGNOSIS — I110 Hypertensive heart disease with heart failure: Secondary | ICD-10-CM | POA: Diagnosis not present

## 2019-08-04 DIAGNOSIS — J45909 Unspecified asthma, uncomplicated: Secondary | ICD-10-CM | POA: Diagnosis not present

## 2019-08-04 DIAGNOSIS — K529 Noninfective gastroenteritis and colitis, unspecified: Secondary | ICD-10-CM | POA: Diagnosis not present

## 2019-08-04 DIAGNOSIS — I482 Chronic atrial fibrillation, unspecified: Secondary | ICD-10-CM | POA: Diagnosis not present

## 2019-08-04 DIAGNOSIS — M1991 Primary osteoarthritis, unspecified site: Secondary | ICD-10-CM | POA: Diagnosis not present

## 2019-08-04 DIAGNOSIS — K5790 Diverticulosis of intestine, part unspecified, without perforation or abscess without bleeding: Secondary | ICD-10-CM | POA: Diagnosis not present

## 2019-08-04 DIAGNOSIS — M810 Age-related osteoporosis without current pathological fracture: Secondary | ICD-10-CM | POA: Diagnosis not present

## 2019-08-04 DIAGNOSIS — G629 Polyneuropathy, unspecified: Secondary | ICD-10-CM | POA: Diagnosis not present

## 2019-08-04 DIAGNOSIS — I5032 Chronic diastolic (congestive) heart failure: Secondary | ICD-10-CM | POA: Diagnosis not present

## 2019-08-09 ENCOUNTER — Other Ambulatory Visit: Payer: Self-pay

## 2019-08-09 NOTE — Patient Outreach (Signed)
[facility]) Care Management  08/09/2019  Kristen Whitehead 12/23/1922 [id number]   Telephone Assessment   Outreach attempt #1 to patient. Spoke briefly with patient. She reported she was getting dressed to go to MD appt and could not talk at present.      Plan: RN CM discussed with patient next outreach within the month of January. Patient gave verbal consent and in agreement with RN CM follow up and timeframe. Patient aware that they may contact RN CM sooner for any issues or concerns.   Kristen Montgomery, RN,BSN,CCM [place] [facility] Telephonic Care Management Coordinator Direct Phone: [phone] Toll Free: [phone] Fax: [phone]

## 2019-08-10 ENCOUNTER — Ambulatory Visit: Payer: Self-pay

## 2019-08-10 DIAGNOSIS — I110 Hypertensive heart disease with heart failure: Secondary | ICD-10-CM | POA: Diagnosis not present

## 2019-08-10 DIAGNOSIS — I5032 Chronic diastolic (congestive) heart failure: Secondary | ICD-10-CM | POA: Diagnosis not present

## 2019-08-10 DIAGNOSIS — K5790 Diverticulosis of intestine, part unspecified, without perforation or abscess without bleeding: Secondary | ICD-10-CM | POA: Diagnosis not present

## 2019-08-10 DIAGNOSIS — J45909 Unspecified asthma, uncomplicated: Secondary | ICD-10-CM | POA: Diagnosis not present

## 2019-08-10 DIAGNOSIS — M810 Age-related osteoporosis without current pathological fracture: Secondary | ICD-10-CM | POA: Diagnosis not present

## 2019-08-10 DIAGNOSIS — G629 Polyneuropathy, unspecified: Secondary | ICD-10-CM | POA: Diagnosis not present

## 2019-08-10 DIAGNOSIS — M1991 Primary osteoarthritis, unspecified site: Secondary | ICD-10-CM | POA: Diagnosis not present

## 2019-08-10 DIAGNOSIS — I482 Chronic atrial fibrillation, unspecified: Secondary | ICD-10-CM | POA: Diagnosis not present

## 2019-08-10 DIAGNOSIS — K529 Noninfective gastroenteritis and colitis, unspecified: Secondary | ICD-10-CM | POA: Diagnosis not present

## 2019-08-11 DIAGNOSIS — K5790 Diverticulosis of intestine, part unspecified, without perforation or abscess without bleeding: Secondary | ICD-10-CM | POA: Diagnosis not present

## 2019-08-11 DIAGNOSIS — J45909 Unspecified asthma, uncomplicated: Secondary | ICD-10-CM | POA: Diagnosis not present

## 2019-08-11 DIAGNOSIS — M810 Age-related osteoporosis without current pathological fracture: Secondary | ICD-10-CM | POA: Diagnosis not present

## 2019-08-11 DIAGNOSIS — G629 Polyneuropathy, unspecified: Secondary | ICD-10-CM | POA: Diagnosis not present

## 2019-08-11 DIAGNOSIS — M1991 Primary osteoarthritis, unspecified site: Secondary | ICD-10-CM | POA: Diagnosis not present

## 2019-08-11 DIAGNOSIS — I5032 Chronic diastolic (congestive) heart failure: Secondary | ICD-10-CM | POA: Diagnosis not present

## 2019-08-11 DIAGNOSIS — K529 Noninfective gastroenteritis and colitis, unspecified: Secondary | ICD-10-CM | POA: Diagnosis not present

## 2019-08-11 DIAGNOSIS — I110 Hypertensive heart disease with heart failure: Secondary | ICD-10-CM | POA: Diagnosis not present

## 2019-08-11 DIAGNOSIS — I482 Chronic atrial fibrillation, unspecified: Secondary | ICD-10-CM | POA: Diagnosis not present

## 2019-08-16 DIAGNOSIS — N8182 Incompetence or weakening of pubocervical tissue: Secondary | ICD-10-CM | POA: Diagnosis not present

## 2019-08-17 DIAGNOSIS — K5790 Diverticulosis of intestine, part unspecified, without perforation or abscess without bleeding: Secondary | ICD-10-CM | POA: Diagnosis not present

## 2019-08-17 DIAGNOSIS — G629 Polyneuropathy, unspecified: Secondary | ICD-10-CM | POA: Diagnosis not present

## 2019-08-17 DIAGNOSIS — M1991 Primary osteoarthritis, unspecified site: Secondary | ICD-10-CM | POA: Diagnosis not present

## 2019-08-17 DIAGNOSIS — I5032 Chronic diastolic (congestive) heart failure: Secondary | ICD-10-CM | POA: Diagnosis not present

## 2019-08-17 DIAGNOSIS — J45909 Unspecified asthma, uncomplicated: Secondary | ICD-10-CM | POA: Diagnosis not present

## 2019-08-17 DIAGNOSIS — I110 Hypertensive heart disease with heart failure: Secondary | ICD-10-CM | POA: Diagnosis not present

## 2019-08-17 DIAGNOSIS — I482 Chronic atrial fibrillation, unspecified: Secondary | ICD-10-CM | POA: Diagnosis not present

## 2019-08-17 DIAGNOSIS — M810 Age-related osteoporosis without current pathological fracture: Secondary | ICD-10-CM | POA: Diagnosis not present

## 2019-08-17 DIAGNOSIS — K529 Noninfective gastroenteritis and colitis, unspecified: Secondary | ICD-10-CM | POA: Diagnosis not present

## 2019-08-18 ENCOUNTER — Encounter: Payer: Self-pay | Admitting: Cardiovascular Disease

## 2019-08-18 ENCOUNTER — Ambulatory Visit (INDEPENDENT_AMBULATORY_CARE_PROVIDER_SITE_OTHER): Payer: Medicare HMO | Admitting: Cardiovascular Disease

## 2019-08-18 ENCOUNTER — Other Ambulatory Visit: Payer: Self-pay

## 2019-08-18 VITALS — BP 121/60 | HR 83 | Ht 59.0 in | Wt 131.6 lb

## 2019-08-18 DIAGNOSIS — I10 Essential (primary) hypertension: Secondary | ICD-10-CM

## 2019-08-18 DIAGNOSIS — E7800 Pure hypercholesterolemia, unspecified: Secondary | ICD-10-CM | POA: Diagnosis not present

## 2019-08-18 DIAGNOSIS — I4891 Unspecified atrial fibrillation: Secondary | ICD-10-CM | POA: Diagnosis not present

## 2019-08-18 DIAGNOSIS — I482 Chronic atrial fibrillation, unspecified: Secondary | ICD-10-CM | POA: Diagnosis not present

## 2019-08-18 DIAGNOSIS — I5032 Chronic diastolic (congestive) heart failure: Secondary | ICD-10-CM | POA: Diagnosis not present

## 2019-08-18 DIAGNOSIS — R0602 Shortness of breath: Secondary | ICD-10-CM | POA: Diagnosis not present

## 2019-08-18 NOTE — Patient Instructions (Signed)
Medication Instructions:  NO CHANGES *If you need a refill on your cardiac medications before your next appointment, please call your pharmacy*  Lab Work: Your physician recommends that you return for lab work today (BMP,BNP)  If you have labs (blood work) drawn today and your tests are completely normal, you will receive your results only by: Marland Kitchen MyChart Message (if you have MyChart) OR . A paper copy in the mail If you have any lab test that is abnormal or we need to change your treatment, we will call you to review the results.  Testing/Procedures: NONE NEEDED  Follow-Up: At [facility], you and your health needs are our priority.  As part of our continuing mission to provide you with exceptional heart care, we have created designated Provider Care Teams.  These Care Teams include your primary Cardiologist (physician) and Advanced Practice Providers (APPs -  Physician Assistants and Nurse Practitioners) who all work together to provide you with the care you need, when you need it.  Your next appointment:   2 month(s)  The format for your next appointment:   In Person  Provider:   [name], MD

## 2019-08-18 NOTE — Progress Notes (Signed)
Cardiology Office Note   Date:  08/18/2019   ID:  Marthella, Tarbutton June 05, 1923, MRN [id number]  PCP:  Leighton Ruff, MD  Cardiologist:   [name], MD   No chief complaint on file.     History of Present Illness: Kristen Whitehead is a 85 y.o. female with chronic atrial fibrillation, hypertension, hyperlipidemia, mild MR and AR and chronic diastolic heart failure who presents for follow up. Kristen Whitehead was previously a patient of Kristen Whitehead. She last saw him 08/2015 for follow-up on frequent episodes of chest pain. She had been referred for stress testing but declined, as she has not tolerated Lexiscan well in the past. Troponin was checked and was negative. This is been a chronic problem so further evaluation was deferred. Kristen Whitehead stress test in 2013 was negative for ischemia. She reports having "heart attacks" that occur when she overexerts herself. When this happens she gets pain across Kristen Whitehead chest that is associated with shortness of breath. She denies lower extremity edema, orthopnea or PND. She watched a documentary on broken heart syndrome and thinks this must be what happens to Kristen Whitehead. When she gets the pain she prays for God to take it away and eventually the pain subsides. There is no associated nausea or diaphoresis. Imdur was increased to 90 mg daily on 10/2015. Since then she had an episode of heart failure 01/2016 that responded to increased lasix. She also has chronic exertional dyspnea.  Kristen Whitehead, Kristen Whitehead, on 12/11/16. Lasix was increased to 60 mg in the morning and 40 in the afternoon. There was no change in Kristen Whitehead symptoms.  She followed up with Kristen Flake, Kristen Whitehead on 01/2018 and Kristen Whitehead energy was worse but Kristen Whitehead BP was better. She felt better with physical activity. Kristen Whitehead BP was low in the morning so Imdur was decreased to 60mg  and she started taking it in the morning instead of the afternoon. At Kristen Whitehead last appointment Kristen Whitehead BP was  poorly controlled.  Irbesartan was increased to 150mg  bid and atenolol was increased.  Since then Kristen Whitehead BP has been in the 150s-170s/100s.  She notes that she doesn't eat out much but she does eat frozen dinners.  When she checks Kristen Whitehead BP at home she doesn't rest before checking it. She notes that she has been unsteady on Kristen Whitehead feet.  Kristen Whitehead gait instability seems to be worse when she is tired.  She fell 3 months ago.  She tripped and fell backwards.  She worked with physical therapy and thinks that this is helped.  She gave away Kristen Whitehead treadmill and Kristen Whitehead dog.  Since then she is not walking as much and she thinks Kristen Whitehead ability to walk has diminished.  When she first lays down she feels short of breath especially when lifting Kristen Whitehead legs.  However she denies orthopnea or PND.  She has no shortness of breath when walking.  She has not experienced any chest pain or pressure.  Since Kristen Whitehead last appointment Kristen Whitehead has struggled with abdominal discomfort.  On 07/08/2019, patient called our office with fluctuations in Kristen Whitehead weight and chest pain.  Amlodipine was added to Kristen Whitehead regimen.  She called our office on 07/19/2019 with a with 4 pound weight increase in 5 days.  She did not have swelling or shortness of breath.  She was recently seen in the ER on 07/13/2019 for abdominal mass appreciated on exam at Kristen Whitehead PCPs office.  CT scan with diverticulosis and constipation.  she was also dehydrated and  felt better after getting IV fluids.  She feels like there is a stone in Kristen Whitehead stomach that stops the food from going down.  She has also struggled with diarrhea for months.  In the past ranitidine helped but she can no longer take this Prilosec has not been helpful.  She is scheduled to see GI 1/17.  She has been eating and drinking well but still feels dehydrated.  She is still having diarrhea at times.  She has no chest pain and Kristen Whitehead breathing has been stable.   Past Medical History:  Diagnosis Date  . Bladder prolapse, female, acquired   .  Cancer ([place])    thyroid  . Chest pain    no known ischemic heart disease; negative Myoview July 2013. EF 75% with no ischemia.   . Chronic anticoagulation   . Chronic atrial fibrillation (HCC)    managed with rate control and coumadin  . Chronic diastolic heart failure ([place])   . GERD (gastroesophageal reflux disease)   . Heart disease   . History of congenital mitral regurgitation    mild  . History of mitral valve prolapse 06/15/2008   a. echo 1/14: mild LVH, EF 60-65%, mild MR, mild to mod BAE, PASP 35  . Hypercholesterolemia   . Hypertension   . Hypothyroidism     Past Surgical History:  Procedure Laterality Date  . ABDOMINAL HYSTERECTOMY    . CATARACT EXTRACTION    . CHOLECYSTECTOMY    . Thyroidectomy       Current Outpatient Medications  Medication Sig Dispense Refill  . amLODipine (NORVASC) 2.5 MG tablet Take 2.5 mg by mouth daily.    Marland Kitchen apixaban (ELIQUIS) 5 MG TABS tablet Take 1 tablet (5 mg total) by mouth 2 (two) times daily. 180 tablet 1  . atenolol (TENORMIN) 50 MG tablet Take 1 tablet (50 mg total) by mouth 2 (two) times daily. 180 tablet 3  . Calcium Carbonate-Vitamin D (CALCIUM 600+D PO) Take 1 tablet by mouth 2 (two) times daily.    . Cholecalciferol (VITAMIN D) 2000 UNITS tablet Take 4,000 Units by mouth daily.     . furosemide (LASIX) 40 MG tablet Take 20-40 mg by mouth 2 (two) times daily. 40mg   in the morning, 20mg  in afternoon    . irbesartan (AVAPRO) 300 MG tablet TAKE 1/2 TABLET TWICE A DAY 90 tablet 0  . isosorbide mononitrate (IMDUR) 30 MG 24 hr tablet Take 30 mg by mouth daily.    Marland Kitchen latanoprost (XALATAN) 0.005 % ophthalmic solution Place 1 drop into both eyes at bedtime.     Marland Kitchen levothyroxine (SYNTHROID, LEVOTHROID) 100 MCG tablet Take 100 mcg by mouth daily.     Marland Kitchen MAGNESIUM OXIDE 400 PO Take 800 mg by mouth daily.    . Multiple Vitamins-Minerals (CENTRUM SILVER PO) Take 1 tablet by mouth daily.    . Multiple Vitamins-Minerals (PRESERVISION AREDS 2 PO)  Take 1 capsule by mouth 2 (two) times daily.     . nitroGLYCERIN (NITROSTAT) 0.4 MG SL tablet DISSOLVE 1 TABLET UNDER THE TONGUE EVERY 5 MINUTES FOR 3 DOSES AS NEEDED  FOR  CHEST  PAIN 50 tablet 2  . omeprazole (PRILOSEC) 40 MG capsule Take 40 mg by mouth daily.    . potassium chloride (K-DUR) 10 MEQ tablet Take 1 tablet (10 mEq total) by mouth daily. (Patient taking differently: Take 15 mEq by mouth daily. ) 90 tablet 1  . pramipexole (MIRAPEX) 0.125 MG tablet Take 1 tablet (0.125 mg total)  by mouth every evening. 90 tablet 3  . PREMARIN vaginal cream Place 2 g vaginally once a week. Uses on Sunday and wednesday    . Probiotic Product (PROBIOTIC DAILY PO) Take 1 tablet by mouth daily.    . temazepam (RESTORIL) 15 MG capsule Take 15 mg by mouth at bedtime.     No current facility-administered medications for this visit.    Allergies:   Azithromycin, Compazine [prochlorperazine edisylate], Demerol, Dolophine [methadone], Ebastine, Erythromycin, Ibuprofen, Statins, Tetanus toxoids, and Tetracyclines & related    Social History:  The patient  reports that she has never smoked. She has never used smokeless tobacco. She reports that she does not drink alcohol or use drugs.   Family History:  The patient's family history includes Stroke in Kristen Whitehead father and mother.   ROS:  Please see the history of present illness.   Otherwise, review of systems are positive for none.   All other systems are reviewed and negative.    PHYSICAL EXAM: VS:  BP 121/60   Pulse 83   Ht 4\' 11"  (1.499 m)   Wt 131 lb 9.6 oz (59.7 kg)   SpO2 92%   BMI 26.58 kg/m  , BMI Body mass index is 26.58 kg/m. GENERAL:  Well appearing HEENT: Pupils equal round and reactive, fundi not visualized, oral mucosa unremarkable NECK:  No jugular venous distention, waveform within normal limits, carotid upstroke brisk and symmetric, no bruits LUNGS:  Clear to auscultation bilaterally HEART:  Irregularly irregular.  PMI not displaced or  sustained,S1 and S2 within normal limits, no S3, no S4, no clicks, no rubs, no murmurs ABD:  Flat, positive bowel sounds normal in frequency in pitch, no bruits, no rebound, no guarding, no midline pulsatile mass, no hepatomegaly, no splenomegaly EXT:  2 plus pulses throughout, 1+ L>R LE edema to the lower tibia, no cyanosis no clubbing SKIN:  No rashes no nodules NEURO:  Cranial nerves II through XII grossly intact, motor grossly intact throughout PSYCH:  Cognitively intact, oriented to person place and time   EKG:  EKG is not ordered today. 05/06/16: Atrial fibrillation rate 72 bpm 06/15/19: Atrial fibrillation.  Rate 72 bpm.    Echo 11/11/14: Study Conclusions  - Left ventricle: The cavity size was normal. Wall thickness was normal. Systolic function was normal. The estimated ejection fraction was in the range of 60% to 65%. - Aortic valve: There was mild regurgitation. - Mitral valve: Calcified annulus. Mildly thickened leaflets . There was mild regurgitation. - Left atrium: The atrium was mildly dilated. - Right atrium: The atrium was mildly dilated.  Recent Labs: 01/05/2019: NT-Pro BNP 2,323 07/13/2019: ALT 29; BUN 13; Creatinine, Ser 0.84; Hemoglobin 15.2; Platelets 250; Potassium 3.3; Sodium 138    Lipid Panel    Component Value Date/Time   CHOL 211 (H) 11/04/2012 1524   TRIG 201.0 (H) 11/04/2012 1524   HDL 66.80 11/04/2012 1524   CHOLHDL 3 11/04/2012 1524   VLDL 40.2 (H) 11/04/2012 1524   LDLCALC 109 (H) 04/16/2012 1043   LDLDIRECT 98.8 11/04/2012 1524   03/28/16: Sodium 136, potassium 4.1, BUN 17, creatinine 0.74 AST 24, ALT 17 WBC 10, hemoglobin 15.7, hematocrit 46.8, platelets 291  07/21/15: TSH 1.52, free T4 1 0.25  04/13/15: Total cholesterol 235, triglycerides 150, HDL 66, LDL 139  03/08/15: BNP 601  Wt Readings from Last 3 Encounters:  08/18/19 131 lb 9.6 oz (59.7 kg)  07/22/19 134 lb (60.8 kg)  07/13/19 127 lb 3.2 oz (57.7 kg)  ASSESSMENT AND PLAN:  # Hypertension: BPis well-controlled.  Continue amlodipine, atenolol, irbesartan, and Imdur.  # Chest pain:Resolved.   # Chronic atrial fibrillation:Rate remainswell-controlled. Continue atenolol and Eliquis. This patients CHA2DS2-VASc Score and unadjusted Ischemic Stroke Rate (% per year) is equal to 4.8 % stroke rate/year from a score of 4  Above score calculated as 1 point each if present [CHF, HTN, DM, Vascular=MI/PAD/Aortic Plaque, Age if 65-74, or Female] Above score calculated as 2 points each if present [Age >75, or Stroke/TIA/TE]  #Chronic diastolic heart failure: Stable.  She has mild, chronic lower extremity edema.  Continue furosemide and blood pressure control as above.  # Hyperlipidemia: Continue Eliquis.   LDL was 68 on 06/2018    Current medicines are reviewed at length with the patient today.  The patient does not have concerns regarding medicines.  The following changes have been made:  none  Labs/ tests ordered today include:   No orders of the defined types were placed in this encounter.    Disposition:   FU with Kingson Lohmeyer C. Oval Linsey, MD, [facility] in 2 months.     Signed, Buna Cuppett C. Oval Linsey, MD, [facility]  08/18/2019 3:31 PM    [place]

## 2019-08-19 DIAGNOSIS — M1991 Primary osteoarthritis, unspecified site: Secondary | ICD-10-CM | POA: Diagnosis not present

## 2019-08-19 DIAGNOSIS — I5032 Chronic diastolic (congestive) heart failure: Secondary | ICD-10-CM | POA: Diagnosis not present

## 2019-08-19 DIAGNOSIS — K529 Noninfective gastroenteritis and colitis, unspecified: Secondary | ICD-10-CM | POA: Diagnosis not present

## 2019-08-19 DIAGNOSIS — G629 Polyneuropathy, unspecified: Secondary | ICD-10-CM | POA: Diagnosis not present

## 2019-08-19 DIAGNOSIS — J45909 Unspecified asthma, uncomplicated: Secondary | ICD-10-CM | POA: Diagnosis not present

## 2019-08-19 DIAGNOSIS — M810 Age-related osteoporosis without current pathological fracture: Secondary | ICD-10-CM | POA: Diagnosis not present

## 2019-08-19 DIAGNOSIS — I110 Hypertensive heart disease with heart failure: Secondary | ICD-10-CM | POA: Diagnosis not present

## 2019-08-19 DIAGNOSIS — K5790 Diverticulosis of intestine, part unspecified, without perforation or abscess without bleeding: Secondary | ICD-10-CM | POA: Diagnosis not present

## 2019-08-19 DIAGNOSIS — I482 Chronic atrial fibrillation, unspecified: Secondary | ICD-10-CM | POA: Diagnosis not present

## 2019-08-19 LAB — BASIC METABOLIC PANEL
BUN/Creatinine Ratio: 14 (ref 12–28)
BUN: 13 mg/dL (ref 10–36)
CO2: 29 mmol/L (ref 20–29)
Calcium: 9.3 mg/dL (ref 8.7–10.3)
Chloride: 100 mmol/L (ref 96–106)
Creatinine, Ser: 0.96 mg/dL (ref 0.57–1.00)
GFR calc Af Amer: 59 mL/min/{1.73_m2} — ABNORMAL LOW (ref >59)
GFR calc non Af Amer: 51 mL/min/{1.73_m2} — ABNORMAL LOW (ref >59)
Glucose: 80 mg/dL (ref 65–99)
Potassium: 4.7 mmol/L (ref 3.5–5.2)
Sodium: 141 mmol/L (ref 134–144)

## 2019-08-19 LAB — BRAIN NATRIURETIC PEPTIDE: BNP: 360.3 pg/mL — ABNORMAL HIGH (ref 0.0–100.0)

## 2019-09-03 ENCOUNTER — Other Ambulatory Visit: Payer: Self-pay

## 2019-09-03 NOTE — Patient Outreach (Signed)
[name]) Care Management  09/03/2019  Kristen Whitehead [birth year] [id number]   Telephone Assessment   Outreach attempt #1 to patient. Spoke with patient who voices that she is doing fairly well. She states that she had been having some abd pain but it has resolved. She continues to report good appetite. Weight fluctuates. patient states she gained five pounds last week but lost it. She voices weight is back down and stable. Patient continues to voice adhering to diureitc regimen and trying to stick with low salt diet. She continues to get HHPT and reports that she is almost finished with therapy. Patient states that she has switched insurance from [facility] to [facility]. RNCM discussed transfer of patient to another RN CM and she voiced understanding and was in agreement. She dneis any RN CM needs or concerns at this time.  Medications Reviewed Today    Reviewed by Zebedee Iba, CMA (Certified Medical Assistant) on 08/18/19 at 74  Med List Status: <None>  Medication Order Taking? Sig Documenting Provider Last Dose Status Informant  amLODipine (NORVASC) 2.5 MG tablet [id number] Yes Take 2.5 mg by mouth daily. [provider] Taking Active   apixaban (ELIQUIS) 5 MG TABS tablet [id number] Yes Take 1 tablet (5 mg total) by mouth 2 (two) times daily. [name], MD Taking Active Self  atenolol (TENORMIN) 50 MG tablet [id number] Yes Take 1 tablet (50 mg total) by mouth 2 (two) times daily. [name], MD Taking Active Self           Med Note [name], [place] Dec 29, 2018  1:42 PM)    Calcium Carbonate-Vitamin D (CALCIUM 600+D PO) [id number] Yes Take 1 tablet by mouth 2 (two) times daily. [provider] Taking Active Self  Cholecalciferol (VITAMIN D) 2000 UNITS tablet [id number] Yes Take 4,000 Units by mouth daily.  [provider] Taking Active Self  furosemide (LASIX) 40 MG tablet [id number] Yes Take 20-40 mg by mouth 2 (two)  times daily. 66m  in the morning, 255min afternoon [provider] Taking Active Self  irbesartan (AVAPRO) 300 MG tablet [id number] TAKE 1/2 TABLET TWICE A DAY RaSkeet LatchMD Taking Active   isosorbide mononitrate (IMDUR) 30 MG 24 hr tablet [id number] Take 30 mg by mouth daily. [provider] Taking Active   latanoprost (XALATAN) 0.005 % ophthalmic solution [id number] Place 1 drop into both eyes at bedtime.  [provider] Taking Active Self  levothyroxine (SYNTHROID, LEVOTHROID) 100 MCG tablet [id number] Take 100 mcg by mouth daily.  [provider] Taking Active Self           Med Note (DOlena HeckleMICHELE   Mon Nov 18, 2016 10:47 AM)    MAGNESIUM OXIDE 400 PO [id number] Take 800 mg by mouth daily. [provider] Taking Active Self  Multiple Vitamins-Minerals (CENTRUM SILVER PO) [id number] Take 1 tablet by mouth daily. [provider] Taking Active Self  Multiple Vitamins-Minerals (PRESERVISION AREDS 2 PO) [id number] Take 1 capsule by mouth 2 (two) times daily.  [provider] Taking Active Self  nitroGLYCERIN (NITROSTAT) 0.4 MG SL tablet [id number] DISSOLVE 1 TABLET UNDER THE TONGUE EVERY 5 MINUTES FOR 3 DOSES AS NEEDED  FOR  CHEST  PAIN RaSkeet LatchMD Taking Active Self  omeprazole (PRILOSEC) 40 MG capsule [id number] Take 40 mg by mouth daily. [provider] Taking Active Self  Med Note (GREEN, STACY L   Wed Aug 18, 2019  2:31 PM)    potassium chloride (K-DUR) 10 MEQ tablet [id number] Yes Take 1 tablet (10 mEq total) by mouth daily.  Patient taking differently: Take 15 mEq by mouth daily.    [name], MD Taking Active Self  pramipexole (MIRAPEX) 0.125 MG tablet [id number] Yes Take 1 tablet (0.125 mg total) by mouth every evening. Star Age, MD Taking Active Self  PREMARIN vaginal cream [id number] Yes Place 2 g vaginally once a week. Uses on Sunday and wednesday  [provider] Taking Active Self  Probiotic Product (PROBIOTIC DAILY PO) [id number] Yes Take 1 tablet by mouth daily. [provider] Taking Active Self  temazepam (RESTORIL) 15 MG capsule [id number] Yes Take 15 mg by mouth at bedtime. [provider] Taking Active Self          [facility] CM Care Plan Problem One     Most Recent Value  Care Plan Problem One  Knowledge deficit regarding mgmt and disease process of CHF  Role Documenting the Problem One  Care Management Telephonic Coordinator  Care Plan for Problem One  Active  THN Long Term Goal   Patient will hvae no CHF related hospitalizations over the next 90 days.  THN Long Term Goal Start Date  06/14/19  Interventions for Problem One Long Term Goal  RN CM assessed for any acute issues. RN CM reinforced CHF education,action plan and wgt mgmt.   THN CM Short Term Goal #2   Patient will report daily monitoring of weight and BP in the home over the next 30 days.  THN CM Short Term Goal #2 Start Date  06/14/19  [facility] CM Short Term Goal #2 Met Date  09/03/19     Plan: RN CM will transfer patient to another RN Health coach for continued disease mgmt/education.  RN CM will send quarterly update.   Kristen Montgomery, RN,BSN,CCM [place] Management Telephonic Care Management Coordinator Direct Phone: [phone] Toll Free: [phone] Fax: [phone]

## 2019-09-06 ENCOUNTER — Ambulatory Visit: Payer: Self-pay

## 2019-09-10 ENCOUNTER — Encounter: Payer: Self-pay | Admitting: Cardiovascular Disease

## 2019-09-10 ENCOUNTER — Telehealth: Payer: Self-pay | Admitting: *Deleted

## 2019-09-10 NOTE — Telephone Encounter (Signed)
-----   Message from [name], MD sent at 09/10/2019  4:42 PM EST ----- OK.  Let's increase amlodipine to 5mg .  Keep track of BP and bring to follow up.

## 2019-09-10 NOTE — Telephone Encounter (Signed)
Advised patient, verbalized understanding  She will call if needs refilled prior to follow up visit

## 2019-09-15 ENCOUNTER — Telehealth: Payer: Self-pay | Admitting: Cardiovascular Disease

## 2019-09-15 MED ORDER — AMLODIPINE BESYLATE 2.5 MG PO TABS: 5.0000 mg | ORAL_TABLET | Freq: Every day | ORAL | 11 refills | Status: DC

## 2019-09-15 NOTE — Telephone Encounter (Signed)
[employer] is calling and is needing a new prescription to be sent to them stating the pt is taking    amLODipine (NORVASC) 2.5 MG tablet 2 x daily     Please advise

## 2019-09-15 NOTE — Telephone Encounter (Signed)
Script went to [facility] as reqeusted .Adonis Housekeeper

## 2019-09-17 ENCOUNTER — Telehealth: Payer: Self-pay | Admitting: Cardiovascular Disease

## 2019-09-17 NOTE — Telephone Encounter (Signed)
New Message    Pt is calling to speak with the nurse about her medications  She is asking for [name] to call her back     Please Call back

## 2019-09-17 NOTE — Telephone Encounter (Signed)
Called patient, she just wanted to make sure that if she did not always take her BP after two hours after she takes it if it was still worth taking. I did advise her to try to take it then, but if she takes it at different times that could be helpful at times too.  Patient verbalized understanding.

## 2019-10-05 ENCOUNTER — Other Ambulatory Visit: Payer: Self-pay | Admitting: Cardiovascular Disease

## 2019-10-05 NOTE — Telephone Encounter (Signed)
46f 59.7kg Scr 0.96 08/18/19 [facility]/[place] 08/18/19 Pt requesting 5mg  eliquis but based on dosing criteria only qualifies for 2.5mg  eliquis due to age over 64 and wt under 60kg. Will route to pharmd pool for further review.

## 2019-10-07 ENCOUNTER — Other Ambulatory Visit: Payer: Self-pay | Admitting: *Deleted

## 2019-10-07 ENCOUNTER — Encounter: Payer: Self-pay | Admitting: *Deleted

## 2019-10-07 NOTE — Telephone Encounter (Signed)
Wt is 59.7kg. Will re-assess during [place] on 10/2019 Okay to keep Eliquis 5mg  until next OV

## 2019-10-07 NOTE — Patient Outreach (Signed)
[facility]) Care Management  10/07/2019  Kristen Whitehead Jan 17, 1923 [id number]   [place] Monthly Outreach  Referral Date:  03/09/2018 Referral Source:  Transfer from [place] Reason for Referral:  Continued Disease Management Education Insurance:  [employer]   Outreach Attempt:  Successful telephone outreach to patient for introduction and follow up. HIPAA verified with patient.  Patient reporting she has received her first dose of COVID vaccine last week without any side effects.  Continues to practice safer at home and only leaving home when absolutely necessary.  Continues to weigh daily.  Weight this morning was 128.8 pounds (normal 125-130).  Does report swelling in ankles and hands this morning.  States she typically has swelling that resolves at night, but admits to not recently elevating legs during the day; needs to have someone plug in her recliner.  Encouraged patient to elevate legs during the day and if swelling continues with increase in weight to notify Cardiologist.  Reviewed and discussed low salt diet.  Appointments:  Attended appointment with primary care provider, Dr. Drema Dallas on 07/20/2019.  Attended appointment with Cardiologist, Dr. Oval Linsey on 08/18/2019 and has scheduled follow up on 10/18/2019.  Plan: RN Health Coach will make next telephone outreach to patient within the month of March and patient agrees to future outreach.  [place] [phone] [email or web address]

## 2019-10-18 ENCOUNTER — Encounter: Payer: Self-pay | Admitting: Cardiovascular Disease

## 2019-10-18 ENCOUNTER — Ambulatory Visit: Payer: Medicare Other | Admitting: Cardiovascular Disease

## 2019-10-18 ENCOUNTER — Other Ambulatory Visit: Payer: Self-pay

## 2019-10-18 VITALS — BP 128/80 | HR 67 | Ht 59.0 in | Wt 132.0 lb

## 2019-10-18 DIAGNOSIS — I5032 Chronic diastolic (congestive) heart failure: Secondary | ICD-10-CM | POA: Diagnosis not present

## 2019-10-18 DIAGNOSIS — I10 Essential (primary) hypertension: Secondary | ICD-10-CM

## 2019-10-18 DIAGNOSIS — I340 Nonrheumatic mitral (valve) insufficiency: Secondary | ICD-10-CM | POA: Diagnosis not present

## 2019-10-18 DIAGNOSIS — Z7901 Long term (current) use of anticoagulants: Secondary | ICD-10-CM

## 2019-10-18 DIAGNOSIS — I4891 Unspecified atrial fibrillation: Secondary | ICD-10-CM

## 2019-10-18 MED ORDER — APIXABAN 2.5 MG PO TABS: 2.5000 mg | ORAL_TABLET | Freq: Two times a day (BID) | ORAL | 3 refills | Status: DC

## 2019-10-18 NOTE — Patient Instructions (Signed)
Medication Instructions:  DECREASE YOUR ELIQUIS TO 2.5 MG TWICE A DAY   TAKE YOUR OMEPRAZOLE DAILY   OK TO TAKE AN EXTRA 20 MG OF FUROSEMIDE (LASIX) IN THE AFTERNOON AS NEEDED FOR SWELLING/WEIGHT GAIN   *If you need a refill on your cardiac medications before your next appointment, please call your pharmacy*  Lab Work: NONE  Testing/Procedures: NONE    Follow-Up: At [facility], you and your health needs are our priority.  As part of our continuing mission to provide you with exceptional heart care, we have created designated Provider Care Teams.  These Care Teams include your primary Cardiologist (physician) and Advanced Practice Providers (APPs -  Physician Assistants and Nurse Practitioners) who all work together to provide you with the care you need, when you need it.  We recommend signing up for the patient portal called "MyChart".  Sign up information is provided on this After Visit Summary.  MyChart is used to connect with patients for Virtual Visits (Telemedicine).  Patients are able to view lab/test results, encounter notes, upcoming appointments, etc.  Non-urgent messages can be sent to your provider as well.   To learn more about what you can do with MyChart, go to [email or web address].    Your next appointment:   6 month(s)  You will receive a reminder letter in the mail two months in advance. If you don't receive a letter, please call our office to schedule the follow-up appointment.  The format for your next appointment:   In Person  Provider:   You may see [name], MD or one of the following Advanced Practice Providers on your designated Care Team:    Kerin Ransom, PA-C  [place], [place]  Coletta Memos, [place]

## 2019-10-18 NOTE — Progress Notes (Signed)
Cardiology Office Note   Date:  10/18/2019   ID:  Kristen, Whitehead Dec 31, 1922, MRN [id number]  PCP:  Leighton Ruff, MD  Cardiologist:   [name], MD   No chief complaint on file.    History of Present Illness: Kristen Whitehead is a 86 y.o. female with chronic atrial fibrillation, hypertension, hyperlipidemia, mild MR and AR and chronic diastolic heart failure who presents for follow up. Kristen Whitehead was previously a patient of [name]. She last saw him 08/2015 for follow-up on frequent episodes of chest pain. She had been referred for stress testing but declined, as she has not tolerated Lexiscan well in the past. Troponin was checked and was negative. This is been a chronic problem so further evaluation was deferred. Her stress test in 2013 was negative for ischemia. She reports having "heart attacks" that occur when she overexerts herself. When this happens she gets pain across her chest that is associated with shortness of breath. She denies lower extremity edema, orthopnea or PND. She watched a documentary on broken heart syndrome and thinks this must be what happens to her. When she gets the pain she prays for God to take it away and eventually the pain subsides. There is no associated nausea or diaphoresis. Imdur was increased to 90 mg daily on 10/2015. Since then she had an episode of heart failure 01/2016 that responded to increased lasix. She also has chronic exertional dyspnea.  Kristen Whitehead, [place], on 12/11/16. Lasix was increased to 60 mg in the morning and 40 in the afternoon. There was no change in her symptoms.  She followed up with [name], DNP on 01/2018 and her energy was worse but her BP was better. She felt better with physical activity. Her BP was low in the morning so Imdur was decreased to 60mg  and she started taking it in the morning instead of the afternoon. At her last appointment her blood pressure  was elevated so amlodipine was increased.  Since then her blood pressures been much better controlled.  She has struggled with losing strength over the last year.  She feels that the pandemic really set her back.  She had a fall several months ago.  She did some physical therapy but has not fully regained her strength.  She is using a cane for balance.  She is not getting as much exercise as she used to.  She was walking better prior to the fall and does not feel that she is ever fully recovered.  She has no chest pain other than GERD.  It was better controlled on ranitidine.  She is not sleeping well and has difficulty falling asleep.  She also struggles with frequent urination and does not want to increase her Lasix.  She does continue to have some mild lower extremity edema that does not improve with elevation of her legs.  She has 1 pillow orthopnea.   Past Medical History:  Diagnosis Date  . Bladder prolapse, female, acquired   . Cancer ([place])    thyroid  . Chest pain    no known ischemic heart disease; negative Myoview July 2013. EF 75% with no ischemia.   . Chronic anticoagulation   . Chronic atrial fibrillation (H[facility])    managed with rate control and coumadin  . Chronic diastolic heart failure ([place])   . GERD (gastroesophageal reflux disease)   . Heart disease   . History of congenital mitral regurgitation    mild  .  History of mitral valve prolapse 06/15/2008   a. echo 1/14: mild LVH, EF 60-65%, mild MR, mild to mod BAE, PASP 35  . Hypercholesterolemia   . Hypertension   . Hypothyroidism     Past Surgical History:  Procedure Laterality Date  . ABDOMINAL HYSTERECTOMY    . CATARACT EXTRACTION    . CHOLECYSTECTOMY    . Thyroidectomy       Current Outpatient Medications  Medication Sig Dispense Refill  . amLODipine (NORVASC) 2.5 MG tablet Take 2 tablets (5 mg total) by mouth daily. 60 tablet 11  . apixaban (ELIQUIS) 5 MG TABS tablet Take 1 tablet (5 mg total) by mouth 2 (two)  times daily. 180 tablet 1  . atenolol (TENORMIN) 50 MG tablet Take 1 tablet (50 mg total) by mouth 2 (two) times daily. 180 tablet 3  . Calcium Carbonate-Vitamin D (CALCIUM 600+D PO) Take 1 tablet by mouth 2 (two) times daily.    . Cholecalciferol (VITAMIN D) 2000 UNITS tablet Take 4,000 Units by mouth daily.     . dorzolamide (TRUSOPT) 2 % ophthalmic solution Place 1 drop into the left eye 2 (two) times daily.    . furosemide (LASIX) 40 MG tablet Take 20-40 mg by mouth 2 (two) times daily. 40mg   in the morning, 20mg  in afternoon    . irbesartan (AVAPRO) 300 MG tablet TAKE 1/2 TABLET TWICE A DAY 90 tablet 0  . isosorbide mononitrate (IMDUR) 30 MG 24 hr tablet Take 30 mg by mouth daily.    Marland Kitchen latanoprost (XALATAN) 0.005 % ophthalmic solution Place 1 drop into both eyes at bedtime.     Marland Kitchen levothyroxine (SYNTHROID, LEVOTHROID) 100 MCG tablet Take 100 mcg by mouth daily.     Marland Kitchen MAGNESIUM OXIDE 400 PO Take 800 mg by mouth daily.    . Multiple Vitamins-Minerals (CENTRUM SILVER PO) Take 1 tablet by mouth daily.    . Multiple Vitamins-Minerals (PRESERVISION AREDS 2 PO) Take 1 capsule by mouth 2 (two) times daily.     . nitroGLYCERIN (NITROSTAT) 0.4 MG SL tablet DISSOLVE 1 TABLET UNDER THE TONGUE EVERY 5 MINUTES FOR 3 DOSES AS NEEDED  FOR  CHEST  PAIN 50 tablet 2  . omeprazole (PRILOSEC) 40 MG capsule Take 40 mg by mouth daily.    . potassium chloride (K-DUR) 10 MEQ tablet Take 1 tablet (10 mEq total) by mouth daily. (Patient taking differently: Take 15 mEq by mouth daily. ) 90 tablet 1  . pramipexole (MIRAPEX) 0.125 MG tablet Take 1 tablet (0.125 mg total) by mouth every evening. 90 tablet 3  . PREMARIN vaginal cream Place 2 g vaginally once a week. Uses on Sunday and wednesday    . Probiotic Product (PROBIOTIC DAILY PO) Take 1 tablet by mouth daily.    . temazepam (RESTORIL) 15 MG capsule Take 15 mg by mouth at bedtime.     No current facility-administered medications for this visit.    Allergies:    Azithromycin, Compazine [prochlorperazine edisylate], Demerol, Dolophine [methadone], Ebastine, Erythromycin, Ibuprofen, Statins, Tetanus toxoids, and Tetracyclines & related    Social History:  The patient  reports that she has never smoked. She has never used smokeless tobacco. She reports that she does not drink alcohol or use drugs.   Family History:  The patient's family history includes Stroke in her father and mother.   ROS:  Please see the history of present illness.   Otherwise, review of systems are positive for none.   All other systems are  reviewed and negative.    PHYSICAL EXAM: VS:  BP 128/80   Pulse 67   Ht 4\' 11"  (1.499 m)   Wt 132 lb (59.9 kg)   SpO2 94%   BMI 26.66 kg/m  , BMI Body mass index is 26.66 kg/m. GENERAL:  Well appearing HEENT: Pupils equal round and reactive, fundi not visualized, oral mucosa unremarkable NECK:  No jugular venous distention, waveform within normal limits, carotid upstroke brisk and symmetric, no bruits LUNGS:  Clear to auscultation bilaterally HEART:  Irregularly irregular.  PMI not displaced or sustained,S1 and S2 within normal limits, no S3, no S4, no clicks, no rubs, no murmurs ABD:  Flat, positive bowel sounds normal in frequency in pitch, no bruits, no rebound, no guarding, no midline pulsatile mass, no hepatomegaly, no splenomegaly EXT:  2 plus pulses throughout, trace LE edema yes-man you may do a to the lower tibia, no cyanosis no clubbing SKIN:  No rashes no nodules NEURO:  Cranial nerves II through XII grossly intact, motor grossly intact throughout PSYCH:  Cognitively intact, oriented to person place and time   EKG:  EKG is not ordered today. 05/06/16: Atrial fibrillation rate 72 bpm 06/15/19: Atrial fibrillation.  Rate 72 bpm.    Echo 11/11/14: Study Conclusions  - Left ventricle: The cavity size was normal. Wall thickness was normal. Systolic function was normal. The estimated ejection fraction was in the range of  60% to 65%. - Aortic valve: There was mild regurgitation. - Mitral valve: Calcified annulus. Mildly thickened leaflets . There was mild regurgitation. - Left atrium: The atrium was mildly dilated. - Right atrium: The atrium was mildly dilated.  Recent Labs: 01/05/2019: NT-Pro BNP 2,323 07/13/2019: ALT 29; Hemoglobin 15.2; Platelets 250 08/18/2019: BNP 360.3; BUN 13; Creatinine, Ser 0.96; Potassium 4.7; Sodium 141    Lipid Panel    Component Value Date/Time   CHOL 211 (H) 11/04/2012 1524   TRIG 201.0 (H) 11/04/2012 1524   HDL 66.80 11/04/2012 1524   CHOLHDL 3 11/04/2012 1524   VLDL 40.2 (H) 11/04/2012 1524   LDLCALC 109 (H) 04/16/2012 1043   LDLDIRECT 98.8 11/04/2012 1524   03/28/16: Sodium 136, potassium 4.1, BUN 17, creatinine 0.74 AST 24, ALT 17 WBC 10, hemoglobin 15.7, hematocrit 46.8, platelets 291  07/21/15: TSH 1.52, free T4 1 0.25  04/13/15: Total cholesterol 235, triglycerides 150, HDL 66, LDL 139  03/08/15: BNP 601  Wt Readings from Last 3 Encounters:  10/18/19 132 lb (59.9 kg)  08/18/19 131 lb 9.6 oz (59.7 kg)  07/22/19 134 lb (60.8 kg)      ASSESSMENT AND PLAN:  # Hypertension: BPis well-controlled.  Continue amlodipine, atenolol, irbesartan, and Imdur.  # Chest pain:Resolved. Continue omeprazole for GERD.  # Chronic atrial fibrillation:Rate remainswell-controlled. Continue atenolol and Eliquis.  Reduce Eliquis to 2.5 mg given her weight less than 60 kg and age. This patients CHA2DS2-VASc Score and unadjusted Ischemic Stroke Rate (% per year) is equal to 4.8 % stroke rate/year from a score of 4  Above score calculated as 1 point each if present [CHF, HTN, DM, Vascular=MI/PAD/Aortic Plaque, Age if 65-74, or Female] Above score calculated as 2 points each if present [Age >75, or Stroke/TIA/TE]  #Chronic diastolic heart failure: Stable.  She has mild, chronic lower extremity edema.  Continue furosemide and blood pressure control as above.  We  discussed taking an extra 20 mg of Lasix as needed.  However she does not want to do this as she already has frequent urination.  #  Hyperlipidemia: Continue Eliquis.   LDL was 68 on 06/2018  Time spent: 30 minutes-Greater than 50% of this time was spent in counseling, explanation of diagnosis, planning of further management, and coordination of care.   Current medicines are reviewed at length with the patient today.  The patient does not have concerns regarding medicines.  The following changes have been made:  none  Labs/ tests ordered today include:   No orders of the defined types were placed in this encounter.    Disposition:   FU with Joellen Tullos C. Oval Linsey, MD, [facility] in 6 months.     Signed, Stclair Szymborski C. Oval Linsey, MD, [facility]  10/18/2019 4:41 PM    [place]

## 2019-10-26 ENCOUNTER — Telehealth: Payer: Self-pay | Admitting: Cardiovascular Disease

## 2019-10-26 NOTE — Telephone Encounter (Signed)
Spoke with pt regarding wt gain of 3 lbs since Sunday. She states that she doubled her dose of lasix yesterday but went from 128-130.4.  she says that usually when she doubles her fluid pill this will help, but it has not this time. She denies any other symptoms and states that she "feels good, I just dont like getting fatter every day!" pt states that she was also changed from Warfarin to eliquis last months and did not know if this may be causing the weight gain.  Told pt our protocol is to have her go ahead and take another dose of her diuretic and that I would inform Dr.[place]. Pt verbalized understanding and was agreeable. Will route to MD

## 2019-10-26 NOTE — Telephone Encounter (Signed)
New Message   Pt c/o swelling: STAT is pt has developed SOB within 24 hours  How much weight have you gained and in what time span? 3lbs 1) If swelling, where is the swelling located? Legs    2) Are you currently taking a fluid pill? yes  3) Are you currently SOB? no  4) Do you have a log of your daily weights (if so, list)? Sunday 127, Monday 128.8, Tuesday 130.4  5) Have you gained 3 pounds in a day or 5 pounds in a week? no  6) Have you traveled recently? yes

## 2019-10-29 ENCOUNTER — Other Ambulatory Visit: Payer: Self-pay | Admitting: *Deleted

## 2019-10-29 MED ORDER — OMEPRAZOLE 40 MG PO CPDR: 40.0000 mg | DELAYED_RELEASE_CAPSULE | Freq: Every day | ORAL | 1 refills | Status: DC

## 2019-10-29 NOTE — Telephone Encounter (Addendum)
Spoke with patient and her weight is doing better, 8 ounces from goal She will continue to monitor and call if anything changes  Discussed with Dr Oval Linsey and she agrees with plan

## 2019-10-29 NOTE — Telephone Encounter (Signed)
Patient called requesting refill of Omeprazole, sent to pharmacy as requested

## 2019-11-08 ENCOUNTER — Other Ambulatory Visit: Payer: Self-pay

## 2019-11-08 MED ORDER — APIXABAN 2.5 MG PO TABS: 2.5000 mg | ORAL_TABLET | Freq: Two times a day (BID) | ORAL | 3 refills | Status: DC

## 2019-11-09 ENCOUNTER — Other Ambulatory Visit: Payer: Self-pay

## 2019-11-09 MED ORDER — AMLODIPINE BESYLATE 2.5 MG PO TABS: 5.0000 mg | ORAL_TABLET | Freq: Every day | ORAL | 11 refills | Status: DC

## 2019-11-09 MED ORDER — POTASSIUM CHLORIDE CRYS ER 10 MEQ PO TBCR: 10.0000 meq | EXTENDED_RELEASE_TABLET | Freq: Every day | ORAL | 1 refills | Status: DC

## 2019-11-10 ENCOUNTER — Telehealth: Payer: Self-pay | Admitting: Cardiovascular Disease

## 2019-11-10 NOTE — Telephone Encounter (Signed)
Kristen Whitehead called to verify we have her New insurance through [employer] listed. She also stated they advised her that they had reached out to our office in regards to a medication and never heard anything back. I advised Kristen Whitehead I do not have any documentation of them calling our office and that she should have them call again. Kristen Whitehead verbalized understanding and will have them callback.

## 2019-11-12 ENCOUNTER — Other Ambulatory Visit: Payer: Self-pay | Admitting: *Deleted

## 2019-11-12 ENCOUNTER — Encounter: Payer: Self-pay | Admitting: *Deleted

## 2019-11-12 NOTE — Patient Outreach (Signed)
[facility]) Care Management  11/12/2019  ASPIN TIMLIN 1922-09-13 [id number]   [place] Monthly Outreach  Referral Date:  03/09/2018 Referral Source:  Transfer from [place] Reason for Referral:  Continued Disease Management Education Insurance:  [employer]   Outreach Attempt:  Successful telephone outreach to patient for follow up.  Patient reporting she ha been having severe stomach pain for the past few weeks.  States today's pain is some better with just a little soreness in lower abdomen and she is awaiting return call from gastroenterologist.  Reports she has had both COVID shots without any adverse reactions.  Weight this morning was 128.6 pounds.  Does report some chronic lower extremity edema, but states it is the same as normal.  Denies any shortness of breath at this time.  Appointments:  Attended appointment with Cardiologist, Dr. Oval Linsey on 10/18/2019.    Plan: RN Health Coach will make next telephone outreach to patient within the month of May and patient agrees to future outreach.  [place] [phone] [email or web address]

## 2019-11-16 ENCOUNTER — Other Ambulatory Visit: Payer: Self-pay

## 2019-11-16 MED ORDER — ATENOLOL 50 MG PO TABS: 50.0000 mg | ORAL_TABLET | Freq: Two times a day (BID) | ORAL | 3 refills | Status: DC

## 2019-11-16 MED ORDER — IRBESARTAN 300 MG PO TABS: 150.0000 mg | ORAL_TABLET | Freq: Two times a day (BID) | ORAL | 3 refills | Status: DC

## 2019-11-16 NOTE — Addendum Note (Signed)
Addended by: Meryl Crutch on: 11/16/2019 12:26 PM   Modules accepted: Orders

## 2019-11-16 NOTE — Telephone Encounter (Signed)
Follow up   Patient would like a return call in reference to previous message.

## 2019-11-16 NOTE — Telephone Encounter (Signed)
Spoke with pt who is requesting new Rx for atenolol and irbesartan to be sent to [facility]. New Rx sent.

## 2019-11-23 ENCOUNTER — Telehealth: Payer: Self-pay | Admitting: Cardiovascular Disease

## 2019-11-23 NOTE — Telephone Encounter (Signed)
Returned call to patient, she states she is experiencing increased LE edema and some SOB since 4/1.   She states her LE were swollen and she was unable to wear shoes, she had to wear sandals.   States normally this improves at night with elevation but they continue to be swollen in the mornings when she wakes up.   She states she is "breathless" when she lays down and puts her feet her, props herself up sometimes but not every night.  R LE has improved, L LE remains swollen, no redness noted and not warm to touch.    Reported weights: 4/1 130  4/2 133 -took additional 20 mg of lasix in PM as instructed 4/3 132 4/4 133-took additional 20 mg of lasix in PM 4/5 130 Today 129 --she states she takes the additional 20 mg of lasix in the pm everyday her weight increases.    She does report that her [birth year] was [birth year] and someone brought her [place] food (steak fajitas) that she has been eating on for 3 days.    Advised to avoid high sodium foods, continue to elevate LE and monitor weights.    Advised would route to MD for review and further recommendations.      Patient also states she was called by a nurse and told she needed lab work at [facility].  She is unsure why she needs to go to [facility] to have this done.   She would rather come to [facility].    After chart review-do not see any recent lab work ordered or phone note with these instructions.   Advised to hold off on labs until clarification with nurse/MD.       Patient agreed and verbalized understanding.

## 2019-11-23 NOTE — Telephone Encounter (Signed)
New message   Pt c/o swelling: STAT is pt has developed SOB within 24 hours  1) How much weight have you gained and in what time span? Gained 3 lbs within the last week   2) If swelling, where is the swelling located?both feet and legs are swollen  3) Are you currently taking a fluid pill? Yes   4) Are you currently SOB? yes  5) Do you have a log of your daily weights (if so, list)? yes  6) Have you gained 3 pounds in a day or 5 pounds in a week? yes  7) Have you traveled recently? no

## 2019-11-29 ENCOUNTER — Other Ambulatory Visit: Payer: Self-pay

## 2019-11-29 DIAGNOSIS — G2581 Restless legs syndrome: Secondary | ICD-10-CM

## 2019-11-29 MED ORDER — PRAMIPEXOLE DIHYDROCHLORIDE 0.125 MG PO TABS: 0.1250 mg | ORAL_TABLET | Freq: Every evening | ORAL | 3 refills | Status: DC

## 2019-12-03 ENCOUNTER — Other Ambulatory Visit: Payer: Self-pay

## 2019-12-08 MED ORDER — POTASSIUM CHLORIDE CRYS ER 10 MEQ PO TBCR: 10.0000 meq | EXTENDED_RELEASE_TABLET | Freq: Every day | ORAL | 1 refills | Status: DC

## 2019-12-08 MED ORDER — APIXABAN 2.5 MG PO TABS: 2.5000 mg | ORAL_TABLET | Freq: Two times a day (BID) | ORAL | 3 refills | Status: DC

## 2019-12-08 MED ORDER — FUROSEMIDE 40 MG PO TABS: 20.0000 mg | ORAL_TABLET | Freq: Two times a day (BID) | ORAL | 2 refills | Status: DC

## 2019-12-08 MED ORDER — OMEPRAZOLE 40 MG PO CPDR: 40.0000 mg | DELAYED_RELEASE_CAPSULE | Freq: Every day | ORAL | 1 refills | Status: DC

## 2019-12-08 MED ORDER — ISOSORBIDE MONONITRATE ER 30 MG PO TB24: 30.0000 mg | ORAL_TABLET | Freq: Every day | ORAL | 1 refills | Status: DC

## 2019-12-09 ENCOUNTER — Other Ambulatory Visit: Payer: Self-pay

## 2019-12-09 ENCOUNTER — Encounter (HOSPITAL_COMMUNITY): Payer: Self-pay | Admitting: Emergency Medicine

## 2019-12-09 ENCOUNTER — Emergency Department (HOSPITAL_COMMUNITY)
Admission: EM | Admit: 2019-12-09 | Discharge: 2019-12-09 | Disposition: A | Discharge status: Home / Self Care | Payer: Medicare Other | Source: Home / Self Care | Attending: Emergency Medicine | Admitting: Emergency Medicine

## 2019-12-09 ENCOUNTER — Telehealth: Payer: Self-pay | Admitting: Cardiovascular Disease

## 2019-12-09 ENCOUNTER — Emergency Department (HOSPITAL_COMMUNITY): Payer: Medicare Other

## 2019-12-09 DIAGNOSIS — I5032 Chronic diastolic (congestive) heart failure: Secondary | ICD-10-CM | POA: Insufficient documentation

## 2019-12-09 DIAGNOSIS — N390 Urinary tract infection, site not specified: Secondary | ICD-10-CM

## 2019-12-09 DIAGNOSIS — I110 Hypertensive heart disease with heart failure: Secondary | ICD-10-CM | POA: Insufficient documentation

## 2019-12-09 DIAGNOSIS — E039 Hypothyroidism, unspecified: Secondary | ICD-10-CM | POA: Insufficient documentation

## 2019-12-09 DIAGNOSIS — J40 Bronchitis, not specified as acute or chronic: Secondary | ICD-10-CM | POA: Diagnosis not present

## 2019-12-09 DIAGNOSIS — R5383 Other fatigue: Secondary | ICD-10-CM | POA: Insufficient documentation

## 2019-12-09 DIAGNOSIS — Z79899 Other long term (current) drug therapy: Secondary | ICD-10-CM | POA: Insufficient documentation

## 2019-12-09 DIAGNOSIS — I509 Heart failure, unspecified: Secondary | ICD-10-CM

## 2019-12-09 DIAGNOSIS — Z7901 Long term (current) use of anticoagulants: Secondary | ICD-10-CM | POA: Insufficient documentation

## 2019-12-09 DIAGNOSIS — Z85850 Personal history of malignant neoplasm of thyroid: Secondary | ICD-10-CM | POA: Insufficient documentation

## 2019-12-09 DIAGNOSIS — R05 Cough: Secondary | ICD-10-CM | POA: Insufficient documentation

## 2019-12-09 LAB — URINALYSIS, ROUTINE W REFLEX MICROSCOPIC
Bilirubin Urine: NEGATIVE
Glucose, UA: NEGATIVE mg/dL
Ketones, ur: NEGATIVE mg/dL
Nitrite: NEGATIVE
Protein, ur: NEGATIVE mg/dL
Specific Gravity, Urine: 1.002 — ABNORMAL LOW (ref 1.005–1.030)
pH: 8 (ref 5.0–8.0)

## 2019-12-09 LAB — COMPREHENSIVE METABOLIC PANEL
ALT: 28 U/L (ref 0–44)
AST: 34 U/L (ref 15–41)
Albumin: 4.1 g/dL (ref 3.5–5.0)
Alkaline Phosphatase: 134 U/L — ABNORMAL HIGH (ref 38–126)
Anion gap: 10 (ref 5–15)
BUN: 11 mg/dL (ref 8–23)
CO2: 29 mmol/L (ref 22–32)
Calcium: 8 mg/dL — ABNORMAL LOW (ref 8.9–10.3)
Chloride: 95 mmol/L — ABNORMAL LOW (ref 98–111)
Creatinine, Ser: 0.69 mg/dL (ref 0.44–1.00)
GFR calc Af Amer: >60 mL/min (ref >60)
GFR calc non Af Amer: >60 mL/min (ref >60)
Glucose, Bld: 107 mg/dL — ABNORMAL HIGH (ref 70–99)
Potassium: 4.1 mmol/L (ref 3.5–5.1)
Sodium: 134 mmol/L — ABNORMAL LOW (ref 135–145)
Total Bilirubin: 1.8 mg/dL — ABNORMAL HIGH (ref 0.3–1.2)
Total Protein: 7.5 g/dL (ref 6.5–8.1)

## 2019-12-09 LAB — CBC WITH DIFFERENTIAL/PLATELET
Abs Immature Granulocytes: 0.03 10*3/uL (ref 0.00–0.07)
Basophils Absolute: 0 10*3/uL (ref 0.0–0.1)
Basophils Relative: 0 %
Eosinophils Absolute: 0 10*3/uL (ref 0.0–0.5)
Eosinophils Relative: 0 %
HCT: 42.2 % (ref 36.0–46.0)
Hemoglobin: 13.9 g/dL (ref 12.0–15.0)
Immature Granulocytes: 0 %
Lymphocytes Relative: 15 %
Lymphs Abs: 1.9 10*3/uL (ref 0.7–4.0)
MCH: 31.2 pg (ref 26.0–34.0)
MCHC: 32.9 g/dL (ref 30.0–36.0)
MCV: 94.6 fL (ref 80.0–100.0)
Monocytes Absolute: 1.1 10*3/uL — ABNORMAL HIGH (ref 0.1–1.0)
Monocytes Relative: 9 %
Neutro Abs: 9.9 10*3/uL — ABNORMAL HIGH (ref 1.7–7.7)
Neutrophils Relative %: 76 %
Platelets: 209 10*3/uL (ref 150–400)
RBC: 4.46 MIL/uL (ref 3.87–5.11)
RDW: 14.4 % (ref 11.5–15.5)
WBC: 13 10*3/uL — ABNORMAL HIGH (ref 4.0–10.5)
nRBC: 0 % (ref 0.0–0.2)

## 2019-12-09 LAB — BRAIN NATRIURETIC PEPTIDE: B Natriuretic Peptide: 648.2 pg/mL — ABNORMAL HIGH (ref 0.0–100.0)

## 2019-12-09 LAB — LACTIC ACID, PLASMA: Lactic Acid, Venous: 1.2 mmol/L (ref 0.5–1.9)

## 2019-12-09 LAB — MAGNESIUM: Magnesium: 2.4 mg/dL (ref 1.7–2.4)

## 2019-12-09 LAB — ETHANOL: Alcohol, Ethyl (B): <10 mg/dL (ref <10)

## 2019-12-09 MED ORDER — FUROSEMIDE 10 MG/ML IJ SOLN
20.0000 mg | Freq: Once | INTRAMUSCULAR | Status: AC
  Administered 2019-12-09: 20 mg via INTRAVENOUS
  Filled 2019-12-09: qty 4

## 2019-12-09 MED ORDER — CEPHALEXIN 500 MG PO CAPS
500.0000 mg | ORAL_CAPSULE | Freq: Once | ORAL | Status: AC
  Administered 2019-12-09: 500 mg via ORAL
  Filled 2019-12-09: qty 1

## 2019-12-09 MED ORDER — CEPHALEXIN 500 MG PO CAPS: 500.0000 mg | ORAL_CAPSULE | Freq: Two times a day (BID) | ORAL | 0 refills | Status: DC

## 2019-12-09 NOTE — Discharge Instructions (Addendum)
As discussed, your evaluation today has been largely reassuring.  But, it is important that you monitor your condition carefully, and do not hesitate to return to the ED if you develop new, or concerning changes in your condition.  Otherwise, please follow-up with your physician for appropriate ongoing care.  For the next 3 days please take 40 mg of Lasix twice daily.  Discuss this with your physician on Monday, or when you next see her.

## 2019-12-09 NOTE — ED Triage Notes (Signed)
Patient presents with general weakness which started 36- 48 hrs ago. Coughing, sneezing and runny nose worsened yesterday but has since cleared. Today she complains of fatigue. She also was unable to sleep last night.   HX CHF AFIB  EMS vitals: 110/44 BP 70's HR 90's % O2 sat on room air 103 CBG 16 RR

## 2019-12-09 NOTE — ED Provider Notes (Signed)
[facility] DEPT Provider Note   CSN: [id number] Arrival date & time: 12/09/19  1732     History Chief Complaint  Patient presents with  . Weakness    Kristen Whitehead is a 86 y.o. female.  HPI     Patient presents with concern of sneezing, cough, weakness, fatigue, feeling badly. Patient has multiple medical issues including A. fib for which she takes blood thinner, heart failure, but had been in her usual state of health until yesterday. Yesterday, without clear precipitant she began feeling poorly in general, without focal pain, but with sneezing, coughing, weakness, fatigue. States she feels somewhat better, but given her degree of illness she now presents for evaluation. She notes that she was attempting to see her physician, but was unable to do so, prompting ED evaluation. Past Medical History:  Diagnosis Date  . Bladder prolapse, female, acquired   . Cancer ([place])    thyroid  . Chest pain    no known ischemic heart disease; negative Myoview July 2013. EF 75% with no ischemia.   . Chronic anticoagulation   . Chronic atrial fibrillation ([facility])    managed with rate control and coumadin  . Chronic diastolic heart failure ([place])   . GERD (gastroesophageal reflux disease)   . Heart disease   . History of congenital mitral regurgitation    mild  . History of mitral valve prolapse 06/15/2008   a. echo 1/14: mild LVH, EF 60-65%, mild MR, mild to mod BAE, PASP 35  . Hypercholesterolemia   . Hypertension   . Hypothyroidism     Patient Active Problem List   Diagnosis Date Noted  . Localized swelling, mass and lump, upper limb 05/06/2019  . Disorder of thyroid gland 09/14/2018  . Malignant tumor of thyroid gland ([place]) 09/14/2018  . Heart disease 09/14/2018  . Chronic anticoagulation 11/18/2016  . Weakness 11/10/2014  . Chronic diastolic heart failure ([place]) 02/01/2014  . Dyspnea 02/01/2014  . Encounter for therapeutic drug monitoring  09/16/2013  . Chest pain at rest 03/12/2012  . Cough 09/16/2011  . Fatigue 04/08/2011  . Essential hypertension 02/11/2011  . Mitral regurgitation 02/11/2011  . Hypothyroidism 02/11/2011  . Hypercholesterolemia 02/11/2011  . Atrial fibrillation ([place]) 12/03/2010    Past Surgical History:  Procedure Laterality Date  . ABDOMINAL HYSTERECTOMY    . CATARACT EXTRACTION    . CHOLECYSTECTOMY    . Thyroidectomy       OB History   No obstetric history on file.     Family History  Problem Relation Age of Onset  . Stroke Mother   . Stroke Father   . Heart attack Neg Hx   . Heart disease Neg Hx     Social History   Tobacco Use  . Smoking status: Never Smoker  . Smokeless tobacco: Never Used  Substance Use Topics  . Alcohol use: No  . Drug use: No    Home Medications Prior to Admission medications   Medication Sig Start Date End Date Taking? Authorizing Provider  amLODipine (NORVASC) 2.5 MG tablet Take 2 tablets (5 mg total) by mouth daily. 11/09/19  Yes [name], MD  apixaban (ELIQUIS) 2.5 MG TABS tablet Take 1 tablet (2.5 mg total) by mouth 2 (two) times daily. 12/08/19  Yes [name], MD  atenolol (TENORMIN) 50 MG tablet Take 1 tablet (50 mg total) by mouth 2 (two) times daily. 11/16/19 02/14/20 Yes [name], MD  Calcium Carbonate-Vitamin D (CALCIUM 600+D PO) Take 1 tablet  by mouth 2 (two) times daily.   Yes [provider]  Cholecalciferol (VITAMIN D) 2000 UNITS tablet Take 4,000 Units by mouth daily.    Yes [provider]  dorzolamide (TRUSOPT) 2 % ophthalmic solution Place 1 drop into the left eye 2 (two) times daily. 09/24/19  Yes [provider]  furosemide (LASIX) 40 MG tablet Take 0.5-1 tablets (20-40 mg total) by mouth 2 (two) times daily. 40mg   in the morning, 20mg  in afternoon. OK TO USE EXTRA 20 MG IN AFTERNOON IF NEEDED 12/08/19  Yes [name], MD  irbesartan (AVAPRO) 300 MG tablet Take 0.5 tablets (150 mg  total) by mouth 2 (two) times daily. 11/16/19  Yes [name], MD  isosorbide mononitrate (IMDUR) 30 MG 24 hr tablet Take 1 tablet (30 mg total) by mouth daily. 12/08/19  Yes [name], MD  latanoprost (XALATAN) 0.005 % ophthalmic solution Place 1 drop into both eyes at bedtime.  02/23/13  Yes [provider]  levothyroxine (SYNTHROID, LEVOTHROID) 100 MCG tablet Take 100 mcg by mouth daily.  04/20/15  Yes [provider]  MAGNESIUM OXIDE 400 PO Take 800 mg by mouth daily.   Yes [provider]  Multiple Vitamins-Minerals (CENTRUM SILVER PO) Take 1 tablet by mouth daily.   Yes [provider]  Multiple Vitamins-Minerals (PRESERVISION AREDS 2 PO) Take 1 capsule by mouth 2 (two) times daily.    Yes [provider]  nitroGLYCERIN (NITROSTAT) 0.4 MG SL tablet DISSOLVE 1 TABLET UNDER THE TONGUE EVERY 5 MINUTES FOR 3 DOSES AS NEEDED  FOR  CHEST  PAIN 12/23/18  Yes [name], MD  omeprazole (PRILOSEC) 40 MG capsule Take 1 capsule (40 mg total) by mouth daily. 12/08/19  Yes [name], MD  potassium chloride (KLOR-CON) 10 MEQ tablet Take 1 tablet (10 mEq total) by mouth daily. 12/08/19  Yes [name], MD  pramipexole (MIRAPEX) 0.125 MG tablet Take 1 tablet (0.125 mg total) by mouth every evening. 11/29/19  Yes Star Age, MD  PREMARIN vaginal cream Place 2 g vaginally once a week. Uses on Sunday and wednesday 03/25/18  Yes [provider]  Probiotic Product (PROBIOTIC DAILY PO) Take 1 tablet by mouth daily.   Yes [provider]  temazepam (RESTORIL) 15 MG capsule Take 15 mg by mouth at bedtime.   Yes [provider]    Allergies    Azithromycin, Compazine [prochlorperazine edisylate], Demerol, Dolophine [methadone], Ebastine, Erythromycin, Ibuprofen, Statins, Tetanus toxoids, and Tetracyclines & related  Review of Systems   Review of Systems  Constitutional:       2 pound weight gain over the last day or  so  HENT:       Per HPI, otherwise negative  Respiratory:       Per HPI, otherwise negative  Cardiovascular:       Per HPI, otherwise negative  Gastrointestinal: Negative for vomiting.  Endocrine:       Negative aside from HPI  Genitourinary:       Neg aside from HPI   Musculoskeletal:       Per HPI, otherwise negative  Skin: Negative.   Neurological: Negative for syncope.    Physical Exam Updated Vital Signs BP (!) 127/108   Pulse 76   Temp 98.1 F (36.7 C) (Oral)   Resp (!) 24   SpO2 91%   Physical Exam Vitals and nursing note reviewed.  Constitutional:      General: She is not in acute distress.  Appearance: She is well-developed.  HENT:     Head: Normocephalic and atraumatic.  Eyes:     Conjunctiva/sclera: Conjunctivae normal.  Cardiovascular:     Rate and Rhythm: Normal rate and regular rhythm.     Heart sounds: Murmur present.  Pulmonary:     Effort: Pulmonary effort is normal. No respiratory distress.     Breath sounds: Normal breath sounds. No stridor.  Abdominal:     General: There is no distension.  Musculoskeletal:     Right lower leg: Edema present.     Left lower leg: Edema present.  Skin:    General: Skin is warm and dry.  Neurological:     Mental Status: She is alert and oriented to person, place, and time.     Cranial Nerves: No cranial nerve deficit.     ED Results / Procedures / Treatments   Labs (all labs ordered are listed, but only abnormal results are displayed) Labs Reviewed  COMPREHENSIVE METABOLIC PANEL - Abnormal; Notable for the following components:      Result Value   Sodium 134 (*)    Chloride 95 (*)    Glucose, Bld 107 (*)    Calcium 8.0 (*)    Alkaline Phosphatase 134 (*)    Total Bilirubin 1.8 (*)    All other components within normal limits  CBC WITH DIFFERENTIAL/PLATELET - Abnormal; Notable for the following components:   WBC 13.0 (*)    Neutro Abs 9.9 (*)    Monocytes Absolute 1.1 (*)    All other components  within normal limits  URINALYSIS, ROUTINE W REFLEX MICROSCOPIC - Abnormal; Notable for the following components:   Color, Urine STRAW (*)    Specific Gravity, Urine 1.002 (*)    Hgb urine dipstick MODERATE (*)    Leukocytes,Ua LARGE (*)    Bacteria, UA RARE (*)    All other components within normal limits  BRAIN NATRIURETIC PEPTIDE - Abnormal; Notable for the following components:   B Natriuretic Peptide 648.2 (*)    All other components within normal limits  ETHANOL  LACTIC ACID, PLASMA  MAGNESIUM    EKG None  Radiology DG Chest Port 1 View  Result Date: 12/09/2019 CLINICAL DATA:  Generalized weakness for 2 days, coughing, rhinorrhea EXAM: PORTABLE CHEST 1 VIEW COMPARISON:  10/10/2018 FINDINGS: Single frontal view of the chest demonstrates an enlarged cardiac silhouette. No acute airspace disease, effusion, or pneumothorax. Chronic background scarring. IMPRESSION: 1. Stable exam, no acute process. Electronically Signed   By: Randa Ngo M.D.   On: 12/09/2019 19:04    Procedures Procedures (including critical care time)  Medications Ordered in ED Medications  furosemide (LASIX) injection 20 mg (has no administration in time range)  cephALEXin (KEFLEX) capsule 500 mg (has no administration in time range)    ED Course  I have reviewed the triage vital signs and the nursing notes.  Pertinent labs & imaging results that were available during my care of the patient were reviewed by me and considered in my medical decision making (see chart for details).   Reviewed the patient's chart notable for history of A. fib, chronic anticoagulation, and echocardiogram from last year results as below: Study Conclusions   - Left ventricle: The cavity size was normal. Wall thickness was    normal. Systolic function was normal. The estimated ejection    fraction was in the range of 60% to 65%. Indeterminant diastolic    function (atrial fibrillation). Wall motion was normal; there  were no regional wall motion abnormalities.  - Aortic valve: Trileaflet; moderately calcified leaflets.    Sclerosis without stenosis. There was trivial regurgitation.  - Mitral valve: Moderately calcified annulus. There was trivial    regurgitation.  - Left atrium: The atrium was moderately dilated.  - Right ventricle: The cavity size was normal. Systolic function    was normal.  - Right atrium: The atrium was moderately dilated.  - Tricuspid valve: Peak RV-RA gradient (S): 37 mm Hg.  - Pulmonary arteries: PA peak pressure: 52 mm Hg (S).  - Systemic veins: IVC measured 2.5 cm with < 50% respirophasic    variation, suggesting RA pressure 15 mmHg.   MDM Rules/Calculators/A&P                      11:08 PM Patient in no distress, awake, alert.  She remains hemodynamically unremarkable, has no oxygen requirement, with saturation about 93%.  We discussed findings, which are notable for worsening heart failure, with an elevated BNP, and likely urinary tract infection.  Patient has mild leukocytosis, but no lactic acidosis, there is no evidence for bacteremia, sepsis.  However, given the context of events I discussed with her option for admission for therapy, though outpatient management is reasonable, and she has a strong preference for this latter option, states that she would like to sleep in her own bed.  We discussed the importance of obtaining her medication after initiating them today with IV Lasix, oral antibiotics, and again the importance of following up with primary care.  After initiation of diuretics, antibiotics, patient was discharged in stable condition. Final Clinical Impression(s) / ED Diagnoses Final diagnoses:  Lower urinary tract infectious disease  Acute on chronic congestive heart failure, unspecified heart failure type [facility])      Carmin Muskrat, MD 12/09/19 2310

## 2019-12-09 NOTE — Telephone Encounter (Signed)
Spoke with patient. Patient reports she woke up yesterday with a runny nose, sore throat and cough. Her oxygen was 77% and she had an episode of left sided chest pain which resolved on it's own. Patient gained 2lbs overnight. She called her PCP but was told "they aren't seeing anyone sick, stated they send everyone to the ER."  Today she was feeling better, no symptoms besides weakness and hoarseness. No other episodes of chest pain. Patient's oxygen was in the 80s this morning. She and a friend called 911 and once EMS arrived her oxygen was in the 90s. Patient did not want to go to the emergency room but wanted to find a clinic.   She reports her PCP told her to have her heart x-rayed. Patient advised to go to the nearest emergency room or urgent care.

## 2019-12-09 NOTE — Telephone Encounter (Signed)
Patient is requesting to speak with a nurse in regards to how she has been feeling. She states yesterday, 12/08/19 she was feeling sick and she was advised by her PCP to go to the ER to have her heart evaluated. She has not had any chest pain. However, she states her oxygen got down to 77 and that is not usual for her. Please call to discuss.

## 2019-12-10 ENCOUNTER — Emergency Department (HOSPITAL_COMMUNITY): Payer: Medicare Other

## 2019-12-10 ENCOUNTER — Inpatient Hospital Stay (HOSPITAL_COMMUNITY)
Admission: EM | Admit: 2019-12-10 | Discharge: 2019-12-15 | DRG: 291 | Disposition: A | Discharge status: Skilled Nursing Facility | Payer: Medicare Other | Source: Ambulatory Visit | Attending: Internal Medicine | Admitting: Internal Medicine

## 2019-12-10 ENCOUNTER — Encounter (HOSPITAL_COMMUNITY): Payer: Self-pay

## 2019-12-10 ENCOUNTER — Other Ambulatory Visit: Payer: Self-pay

## 2019-12-10 DIAGNOSIS — I482 Chronic atrial fibrillation, unspecified: Secondary | ICD-10-CM | POA: Diagnosis present

## 2019-12-10 DIAGNOSIS — Z7952 Long term (current) use of systemic steroids: Secondary | ICD-10-CM

## 2019-12-10 DIAGNOSIS — Z887 Allergy status to serum and vaccine status: Secondary | ICD-10-CM

## 2019-12-10 DIAGNOSIS — Z7901 Long term (current) use of anticoagulants: Secondary | ICD-10-CM

## 2019-12-10 DIAGNOSIS — Z885 Allergy status to narcotic agent status: Secondary | ICD-10-CM

## 2019-12-10 DIAGNOSIS — I2510 Atherosclerotic heart disease of native coronary artery without angina pectoris: Secondary | ICD-10-CM | POA: Diagnosis present

## 2019-12-10 DIAGNOSIS — I5032 Chronic diastolic (congestive) heart failure: Secondary | ICD-10-CM

## 2019-12-10 DIAGNOSIS — G2581 Restless legs syndrome: Secondary | ICD-10-CM | POA: Diagnosis present

## 2019-12-10 DIAGNOSIS — I5033 Acute on chronic diastolic (congestive) heart failure: Secondary | ICD-10-CM | POA: Diagnosis not present

## 2019-12-10 DIAGNOSIS — E871 Hypo-osmolality and hyponatremia: Secondary | ICD-10-CM | POA: Diagnosis present

## 2019-12-10 DIAGNOSIS — Z66 Do not resuscitate: Secondary | ICD-10-CM | POA: Diagnosis present

## 2019-12-10 DIAGNOSIS — N3000 Acute cystitis without hematuria: Secondary | ICD-10-CM

## 2019-12-10 DIAGNOSIS — E876 Hypokalemia: Secondary | ICD-10-CM | POA: Diagnosis present

## 2019-12-10 DIAGNOSIS — Z881 Allergy status to other antibiotic agents status: Secondary | ICD-10-CM

## 2019-12-10 DIAGNOSIS — Z79890 Hormone replacement therapy (postmenopausal): Secondary | ICD-10-CM

## 2019-12-10 DIAGNOSIS — Z79899 Other long term (current) drug therapy: Secondary | ICD-10-CM

## 2019-12-10 DIAGNOSIS — Z886 Allergy status to analgesic agent status: Secondary | ICD-10-CM

## 2019-12-10 DIAGNOSIS — E039 Hypothyroidism, unspecified: Secondary | ICD-10-CM | POA: Diagnosis present

## 2019-12-10 DIAGNOSIS — R627 Adult failure to thrive: Secondary | ICD-10-CM | POA: Diagnosis present

## 2019-12-10 DIAGNOSIS — I10 Essential (primary) hypertension: Secondary | ICD-10-CM | POA: Diagnosis present

## 2019-12-10 DIAGNOSIS — J040 Acute laryngitis: Secondary | ICD-10-CM | POA: Diagnosis present

## 2019-12-10 DIAGNOSIS — K219 Gastro-esophageal reflux disease without esophagitis: Secondary | ICD-10-CM | POA: Diagnosis present

## 2019-12-10 DIAGNOSIS — R911 Solitary pulmonary nodule: Secondary | ICD-10-CM

## 2019-12-10 DIAGNOSIS — J40 Bronchitis, not specified as acute or chronic: Secondary | ICD-10-CM

## 2019-12-10 DIAGNOSIS — I509 Heart failure, unspecified: Secondary | ICD-10-CM

## 2019-12-10 DIAGNOSIS — J9601 Acute respiratory failure with hypoxia: Secondary | ICD-10-CM | POA: Diagnosis present

## 2019-12-10 DIAGNOSIS — Z20822 Contact with and (suspected) exposure to covid-19: Secondary | ICD-10-CM | POA: Diagnosis present

## 2019-12-10 DIAGNOSIS — I4891 Unspecified atrial fibrillation: Secondary | ICD-10-CM | POA: Diagnosis not present

## 2019-12-10 DIAGNOSIS — I110 Hypertensive heart disease with heart failure: Principal | ICD-10-CM | POA: Diagnosis present

## 2019-12-10 DIAGNOSIS — E890 Postprocedural hypothyroidism: Secondary | ICD-10-CM | POA: Diagnosis present

## 2019-12-10 DIAGNOSIS — G4700 Insomnia, unspecified: Secondary | ICD-10-CM | POA: Diagnosis present

## 2019-12-10 DIAGNOSIS — Z888 Allergy status to other drugs, medicaments and biological substances status: Secondary | ICD-10-CM

## 2019-12-10 LAB — BASIC METABOLIC PANEL
Anion gap: 9 (ref 5–15)
BUN: 14 mg/dL (ref 8–23)
CO2: 29 mmol/L (ref 22–32)
Calcium: 7.6 mg/dL — ABNORMAL LOW (ref 8.9–10.3)
Chloride: 95 mmol/L — ABNORMAL LOW (ref 98–111)
Creatinine, Ser: 0.68 mg/dL (ref 0.44–1.00)
GFR calc Af Amer: >60 mL/min (ref >60)
GFR calc non Af Amer: >60 mL/min (ref >60)
Glucose, Bld: 101 mg/dL — ABNORMAL HIGH (ref 70–99)
Potassium: 3.4 mmol/L — ABNORMAL LOW (ref 3.5–5.1)
Sodium: 133 mmol/L — ABNORMAL LOW (ref 135–145)

## 2019-12-10 LAB — CBC WITH DIFFERENTIAL/PLATELET
Abs Immature Granulocytes: 0.01 10*3/uL (ref 0.00–0.07)
Basophils Absolute: 0 10*3/uL (ref 0.0–0.1)
Basophils Relative: 0 %
Eosinophils Absolute: 0.2 10*3/uL (ref 0.0–0.5)
Eosinophils Relative: 2 %
HCT: 36.2 % (ref 36.0–46.0)
Hemoglobin: 12.2 g/dL (ref 12.0–15.0)
Immature Granulocytes: 0 %
Lymphocytes Relative: 20 %
Lymphs Abs: 2 10*3/uL (ref 0.7–4.0)
MCH: 31.9 pg (ref 26.0–34.0)
MCHC: 33.7 g/dL (ref 30.0–36.0)
MCV: 94.8 fL (ref 80.0–100.0)
Monocytes Absolute: 1 10*3/uL (ref 0.1–1.0)
Monocytes Relative: 10 %
Neutro Abs: 6.5 10*3/uL (ref 1.7–7.7)
Neutrophils Relative %: 68 %
Platelets: 186 10*3/uL (ref 150–400)
RBC: 3.82 MIL/uL — ABNORMAL LOW (ref 3.87–5.11)
RDW: 14.3 % (ref 11.5–15.5)
WBC: 9.6 10*3/uL (ref 4.0–10.5)
nRBC: 0 % (ref 0.0–0.2)

## 2019-12-10 LAB — BRAIN NATRIURETIC PEPTIDE: B Natriuretic Peptide: 511.4 pg/mL — ABNORMAL HIGH (ref 0.0–100.0)

## 2019-12-10 LAB — RESPIRATORY PANEL BY RT PCR (FLU A&B, COVID)
Influenza A by PCR: NEGATIVE
Influenza B by PCR: NEGATIVE
SARS Coronavirus 2 by RT PCR: NEGATIVE

## 2019-12-10 MED ORDER — SODIUM CHLORIDE 0.9 % IV SOLN: 250.0000 mL | INTRAVENOUS | Status: DC | PRN

## 2019-12-10 MED ORDER — ISOSORBIDE MONONITRATE ER 30 MG PO TB24
30.0000 mg | ORAL_TABLET | Freq: Every day | ORAL | Status: DC
  Administered 2019-12-11 – 2019-12-15 (×5): 30 mg via ORAL
  Filled 2019-12-10 (×5): qty 1

## 2019-12-10 MED ORDER — PRAMIPEXOLE DIHYDROCHLORIDE 0.125 MG PO TABS: 0.1250 mg | ORAL_TABLET | Freq: Every evening | ORAL | Status: DC

## 2019-12-10 MED ORDER — IRBESARTAN 150 MG PO TABS
150.0000 mg | ORAL_TABLET | Freq: Two times a day (BID) | ORAL | Status: DC
  Administered 2019-12-11 – 2019-12-15 (×9): 150 mg via ORAL
  Filled 2019-12-10 (×9): qty 1

## 2019-12-10 MED ORDER — ONDANSETRON HCL 4 MG/2ML IJ SOLN
4.0000 mg | Freq: Four times a day (QID) | INTRAMUSCULAR | Status: DC | PRN
  Administered 2019-12-12: 4 mg via INTRAVENOUS
  Filled 2019-12-10: qty 2

## 2019-12-10 MED ORDER — ALBUTEROL SULFATE HFA 108 (90 BASE) MCG/ACT IN AERS
2.0000 | INHALATION_SPRAY | Freq: Once | RESPIRATORY_TRACT | Status: AC
  Administered 2019-12-10: 2 via RESPIRATORY_TRACT
  Filled 2019-12-10: qty 6.7

## 2019-12-10 MED ORDER — CEPHALEXIN 500 MG PO CAPS
500.0000 mg | ORAL_CAPSULE | Freq: Two times a day (BID) | ORAL | Status: DC
  Administered 2019-12-11 (×2): 500 mg via ORAL
  Filled 2019-12-10 (×2): qty 1

## 2019-12-10 MED ORDER — POTASSIUM CHLORIDE CRYS ER 10 MEQ PO TBCR
10.0000 meq | EXTENDED_RELEASE_TABLET | Freq: Every day | ORAL | Status: DC
  Administered 2019-12-11 – 2019-12-14 (×4): 10 meq via ORAL
  Filled 2019-12-10 (×4): qty 1

## 2019-12-10 MED ORDER — PREDNISONE 10 MG PO TABS: 50.0000 mg | ORAL_TABLET | Freq: Every day | ORAL | 0 refills | Status: DC

## 2019-12-10 MED ORDER — SODIUM CHLORIDE 0.9% FLUSH: 3.0000 mL | INTRAVENOUS | Status: DC | PRN

## 2019-12-10 MED ORDER — PANTOPRAZOLE SODIUM 40 MG PO TBEC
40.0000 mg | DELAYED_RELEASE_TABLET | Freq: Every day | ORAL | Status: DC
  Administered 2019-12-11 – 2019-12-15 (×5): 40 mg via ORAL
  Filled 2019-12-10 (×5): qty 1

## 2019-12-10 MED ORDER — POTASSIUM CHLORIDE CRYS ER 20 MEQ PO TBCR
20.0000 meq | EXTENDED_RELEASE_TABLET | Freq: Once | ORAL | Status: AC
  Administered 2019-12-10: 20 meq via ORAL
  Filled 2019-12-10: qty 1

## 2019-12-10 MED ORDER — BENZONATATE 100 MG PO CAPS
200.0000 mg | ORAL_CAPSULE | Freq: Once | ORAL | Status: AC
  Administered 2019-12-10: 200 mg via ORAL
  Filled 2019-12-10: qty 2

## 2019-12-10 MED ORDER — AMLODIPINE BESYLATE 5 MG PO TABS
5.0000 mg | ORAL_TABLET | Freq: Every day | ORAL | Status: DC
  Administered 2019-12-11 – 2019-12-15 (×5): 5 mg via ORAL
  Filled 2019-12-10 (×5): qty 1

## 2019-12-10 MED ORDER — BENZONATATE 100 MG PO CAPS: 100.0000 mg | ORAL_CAPSULE | Freq: Three times a day (TID) | ORAL | 0 refills | Status: DC

## 2019-12-10 MED ORDER — SODIUM CHLORIDE 0.9% FLUSH
3.0000 mL | Freq: Two times a day (BID) | INTRAVENOUS | Status: DC
  Administered 2019-12-11 – 2019-12-15 (×8): 3 mL via INTRAVENOUS

## 2019-12-10 MED ORDER — FUROSEMIDE 10 MG/ML IJ SOLN
40.0000 mg | Freq: Two times a day (BID) | INTRAMUSCULAR | Status: DC
  Administered 2019-12-10 – 2019-12-15 (×10): 40 mg via INTRAVENOUS
  Filled 2019-12-10 (×10): qty 4

## 2019-12-10 MED ORDER — LEVOTHYROXINE SODIUM 100 MCG PO TABS
100.0000 ug | ORAL_TABLET | Freq: Every day | ORAL | Status: DC
  Administered 2019-12-11 – 2019-12-15 (×5): 100 ug via ORAL
  Filled 2019-12-10 (×5): qty 1

## 2019-12-10 MED ORDER — DORZOLAMIDE HCL 2 % OP SOLN
1.0000 [drp] | Freq: Two times a day (BID) | OPHTHALMIC | Status: DC
  Administered 2019-12-11 – 2019-12-15 (×10): 1 [drp] via OPHTHALMIC
  Filled 2019-12-10: qty 10

## 2019-12-10 MED ORDER — ACETAMINOPHEN 325 MG PO TABS
650.0000 mg | ORAL_TABLET | ORAL | Status: DC | PRN
  Administered 2019-12-13: 650 mg via ORAL
  Filled 2019-12-10: qty 2

## 2019-12-10 MED ORDER — LATANOPROST 0.005 % OP SOLN
1.0000 [drp] | Freq: Every day | OPHTHALMIC | Status: DC
  Administered 2019-12-11 – 2019-12-14 (×5): 1 [drp] via OPHTHALMIC
  Filled 2019-12-10: qty 2.5

## 2019-12-10 MED ORDER — TEMAZEPAM 15 MG PO CAPS
15.0000 mg | ORAL_CAPSULE | Freq: Every day | ORAL | Status: DC
  Administered 2019-12-11 – 2019-12-14 (×5): 15 mg via ORAL
  Filled 2019-12-10 (×5): qty 1

## 2019-12-10 MED ORDER — APIXABAN 2.5 MG PO TABS
2.5000 mg | ORAL_TABLET | Freq: Two times a day (BID) | ORAL | Status: DC
  Administered 2019-12-11 – 2019-12-15 (×10): 2.5 mg via ORAL
  Filled 2019-12-10 (×10): qty 1

## 2019-12-10 MED ORDER — ATENOLOL 50 MG PO TABS
50.0000 mg | ORAL_TABLET | Freq: Two times a day (BID) | ORAL | Status: DC
  Administered 2019-12-11 – 2019-12-15 (×9): 50 mg via ORAL
  Filled 2019-12-10 (×9): qty 1

## 2019-12-10 NOTE — ED Triage Notes (Signed)
Per EMS, Pt experiencing the same S/S as yesterday. Went to a clinic and they recommended she come here.

## 2019-12-10 NOTE — H&P (Signed)
History and Physical    Kristen Whitehead [id number] DOB: 02-14-1923 DOA: 12/10/2019  PCP: Leighton Ruff, MD   Patient coming from: Home   Chief Complaint: SOB, fatigue   HPI: Kristen Whitehead is a 86 y.o. female with medical history significant for atrial fibrillation on Eliquis, coronary artery disease, hypertension, and hypothyroidism, now presenting to the emergency department for evaluation of shortness of breath and fatigue.  Patient was seen in the emergency department couple days ago and started on Keflex for suspected UTI and was also given a dose of IV Lasix at that time.  She continues to feel short of breath and generally weak, notes that she has been requiring an extra pillow the last few nights in order to sleep without becoming too dyspneic, denies any fevers or chills, but reports a mild cough.  She continues to use a diuretic at home but notes that she has been drinking extra water to prevent dehydration from the diuretic.  She denies chest pain.  ED Course: Upon arrival to the ED, patient is found to be afebrile, saturating mid 80s on room air, tachypneic up to 30, and with stable blood pressure.  Chest x-ray demonstrates mild airway thickening and hazy interstitial opacities bilaterally.  Chemistry panel with slight hyponatremia and hypokalemia.  CBC unremarkable.  BNP elevated to 511.  Covid and influenza PCR are negative.  Patient was treated with albuterol and Tessalon in the ED and hospitalist asked to admit.  Review of Systems:  All other systems reviewed and apart from HPI, are negative.  Past Medical History:  Diagnosis Date  . Bladder prolapse, female, acquired   . Cancer ([place])    thyroid  . Chest pain    no known ischemic heart disease; negative Myoview July 2013. EF 75% with no ischemia.   . Chronic anticoagulation   . Chronic atrial fibrillation (H[facility])    managed with rate control and coumadin  . Chronic diastolic heart failure ([place])   . GERD  (gastroesophageal reflux disease)   . Heart disease   . History of congenital mitral regurgitation    mild  . History of mitral valve prolapse 06/15/2008   a. echo 1/14: mild LVH, EF 60-65%, mild MR, mild to mod BAE, PASP 35  . Hypercholesterolemia   . Hypertension   . Hypothyroidism     Past Surgical History:  Procedure Laterality Date  . ABDOMINAL HYSTERECTOMY    . CATARACT EXTRACTION    . CHOLECYSTECTOMY    . Thyroidectomy       reports that she has never smoked. She has never used smokeless tobacco. She reports that she does not drink alcohol or use drugs.  Allergies  Allergen Reactions  . Azithromycin Nausea Only  . Compazine [Prochlorperazine Edisylate] Nausea Only  . Demerol Nausea Only  . Dolophine [Methadone] Nausea Only  . Ebastine Nausea Only    EBS  . Erythromycin Nausea Only  . Ibuprofen Nausea Only  . Statins Other (See Comments)    myalgias  . Tetanus Toxoids Nausea Only  . Tetracyclines & Related Nausea Only    Family History  Problem Relation Age of Onset  . Stroke Mother   . Stroke Father   . Heart attack Neg Hx   . Heart disease Neg Hx      Prior to Admission medications   Medication Sig Start Date End Date Taking? Authorizing Provider  amLODipine (NORVASC) 2.5 MG tablet Take 2 tablets (5 mg total) by mouth daily. 11/09/19  Yes [name], MD  apixaban (ELIQUIS) 2.5 MG TABS tablet Take 1 tablet (2.5 mg total) by mouth 2 (two) times daily. 12/08/19  Yes [name], MD  atenolol (TENORMIN) 50 MG tablet Take 1 tablet (50 mg total) by mouth 2 (two) times daily. 11/16/19 02/14/20 Yes [name], MD  Calcium Carbonate-Vitamin D (CALCIUM 600+D PO) Take 1 tablet by mouth 2 (two) times daily.   Yes [provider]  cephALEXin (KEFLEX) 500 MG capsule Take 1 capsule (500 mg total) by mouth 2 (two) times daily for 5 days. 12/09/19 12/14/19 Yes Carmin Muskrat, MD  Cholecalciferol (VITAMIN D) 2000 UNITS tablet Take 4,000 Units by  mouth daily.    Yes [provider]  dorzolamide (TRUSOPT) 2 % ophthalmic solution Place 1 drop into the left eye 2 (two) times daily. 09/24/19  Yes [provider]  furosemide (LASIX) 40 MG tablet Take 0.5-1 tablets (20-40 mg total) by mouth 2 (two) times daily. 40mg   in the morning, 20mg  in afternoon. OK TO USE EXTRA 20 MG IN AFTERNOON IF NEEDED 12/08/19  Yes [name], MD  irbesartan (AVAPRO) 300 MG tablet Take 0.5 tablets (150 mg total) by mouth 2 (two) times daily. 11/16/19  Yes [name], MD  isosorbide mononitrate (IMDUR) 30 MG 24 hr tablet Take 1 tablet (30 mg total) by mouth daily. 12/08/19  Yes [name], MD  latanoprost (XALATAN) 0.005 % ophthalmic solution Place 1 drop into both eyes at bedtime.  02/23/13  Yes [provider]  levothyroxine (SYNTHROID, LEVOTHROID) 100 MCG tablet Take 100 mcg by mouth daily.  04/20/15  Yes [provider]  MAGNESIUM OXIDE 400 PO Take 800 mg by mouth daily.   Yes [provider]  Multiple Vitamins-Minerals (CENTRUM SILVER PO) Take 1 tablet by mouth daily.   Yes [provider]  Multiple Vitamins-Minerals (PRESERVISION AREDS 2 PO) Take 1 capsule by mouth 2 (two) times daily.    Yes [provider]  nitroGLYCERIN (NITROSTAT) 0.4 MG SL tablet DISSOLVE 1 TABLET UNDER THE TONGUE EVERY 5 MINUTES FOR 3 DOSES AS NEEDED  FOR  CHEST  PAIN 12/23/18  Yes [name], MD  omeprazole (PRILOSEC) 40 MG capsule Take 1 capsule (40 mg total) by mouth daily. 12/08/19  Yes [name], MD  potassium chloride (KLOR-CON) 10 MEQ tablet Take 1 tablet (10 mEq total) by mouth daily. 12/08/19  Yes [name], MD  pramipexole (MIRAPEX) 0.125 MG tablet Take 1 tablet (0.125 mg total) by mouth every evening. 11/29/19  Yes Star Age, MD  PREMARIN vaginal cream Place 2 g vaginally once a week. Uses on Sunday and wednesday 03/25/18  Yes [provider]  Probiotic Product (PROBIOTIC DAILY PO)  Take 1 tablet by mouth daily.   Yes [provider]  temazepam (RESTORIL) 15 MG capsule Take 15 mg by mouth at bedtime.   Yes [provider]  benzonatate (TESSALON) 100 MG capsule Take 1 capsule (100 mg total) by mouth every 8 (eight) hours. 12/10/19   Varney Biles, MD  predniSONE (DELTASONE) 10 MG tablet Take 5 tablets (50 mg total) by mouth daily. 12/10/19   Varney Biles, MD    Physical Exam: Vitals:   12/10/19 2000 12/10/19 2100 12/10/19 2241 12/10/19 2245  BP: 131/60 120/63  124/68  Pulse: 76 77  70  Resp: (!) 25 14  18   Temp:    98.5 F (36.9 C)  TempSrc:    Oral  SpO2: 96% 93%  98%  Weight:  58.7 kg 58.7 kg  Height:   4\' 11"  (1.499 m) 4\' 11"  (1.499 m)    Constitutional: NAD, calm  Eyes: PERTLA, lids and conjunctivae normal ENMT: Mucous membranes are moist. Posterior pharynx clear of any exudate or lesions.   Neck: normal, supple, no masses, no thyromegaly Respiratory: Tachypnea. Fine rales bilaterally. No pallor or cyanosis.  Cardiovascular: Rate ~80 and irregularly irregular. Mild lower leg swelling bilaterally.   Abdomen: No distension, no tenderness, soft. Bowel sounds active.  Musculoskeletal: no clubbing / cyanosis. No joint deformity upper and lower extremities.   Skin: no significant rashes, lesions, ulcers. Warm, dry, well-perfused.  Neurologic: No facial asymmetry. Sensation intact. Moving all extremities.  Psychiatric: Alert. Oriented to person, place, and situation.     Labs and Imaging on Admission: I have personally reviewed following labs and imaging studies  CBC: Recent Labs  Lab 12/09/19 1907 12/10/19 1754  WBC 13.0* 9.6  NEUTROABS 9.9* 6.5  HGB 13.9 12.2  HCT 42.2 36.2  MCV 94.6 94.8  PLT 209 [id number]   Basic Metabolic Panel: Recent Labs  Lab 12/09/19 1907 12/10/19 1754  NA 134* 133*  K 4.1 3.4*  CL 95* 95*  CO2 29 29  GLUCOSE 107* 101*  BUN 11 14  CREATININE 0.69 0.68  CALCIUM 8.0* 7.6*  MG 2.4  --     GFR: Estimated Creatinine Clearance: 32.8 mL/min (by C-G formula based on SCr of 0.68 mg/dL). Liver Function Tests: Recent Labs  Lab 12/09/19 1907  AST 34  ALT 28  ALKPHOS 134*  BILITOT 1.8*  PROT 7.5  ALBUMIN 4.1   No results for input(s): LIPASE, AMYLASE in the last 168 hours. No results for input(s): AMMONIA in the last 168 hours. Coagulation Profile: No results for input(s): INR, PROTIME in the last 168 hours. Cardiac Enzymes: No results for input(s): CKTOTAL, CKMB, CKMBINDEX, TROPONINI in the last 168 hours. BNP (last 3 results) Recent Labs    01/05/19 1216  PROBNP 2,323*   HbA1C: No results for input(s): HGBA1C in the last 72 hours. CBG: No results for input(s): GLUCAP in the last 168 hours. Lipid Profile: No results for input(s): CHOL, HDL, LDLCALC, TRIG, CHOLHDL, LDLDIRECT in the last 72 hours. Thyroid Function Tests: No results for input(s): TSH, T4TOTAL, FREET4, T3FREE, THYROIDAB in the last 72 hours. Anemia Panel: No results for input(s): VITAMINB12, FOLATE, FERRITIN, TIBC, IRON, RETICCTPCT in the last 72 hours. Urine analysis:    Component Value Date/Time   COLORURINE STRAW (A) 12/09/2019 1907   APPEARANCEUR CLEAR 12/09/2019 1907   LABSPEC 1.002 (L) 12/09/2019 1907   PHURINE 8.0 12/09/2019 1907   GLUCOSEU NEGATIVE 12/09/2019 1907   GLUCOSEU NEGATIVE 06/01/2012 1228   HGBUR MODERATE (A) 12/09/2019 1907   BILIRUBINUR NEGATIVE 12/09/2019 1907   BILIRUBINUR neg 03/21/2013 1703   KETONESUR NEGATIVE 12/09/2019 1907   PROTEINUR NEGATIVE 12/09/2019 1907   UROBILINOGEN 0.2 11/10/2014 2144   NITRITE NEGATIVE 12/09/2019 1907   LEUKOCYTESUR LARGE (A) 12/09/2019 1907   Sepsis Labs: @LABRCNTIP (procalcitonin:4,lacticidven:4) ) Recent Results (from the past 240 hour(s))  Respiratory Panel by RT PCR (Flu A&B, Covid) - Nasopharyngeal Swab     Status: None   Collection Time: 12/10/19  7:23 PM   Specimen: Nasopharyngeal Swab  Result Value Ref Range Status    SARS Coronavirus 2 by RT PCR NEGATIVE NEGATIVE Final    Comment: (NOTE) SARS-CoV-2 target nucleic acids are NOT DETECTED. The SARS-CoV-2 RNA is generally detectable in upper respiratoy specimens during the acute phase of infection.  The lowest concentration of SARS-CoV-2 viral copies this assay can detect is 131 copies/mL. A negative result does not preclude SARS-Cov-2 infection and should not be used as the sole basis for treatment or other patient management decisions. A negative result may occur with  improper specimen collection/handling, submission of specimen other than nasopharyngeal swab, presence of viral mutation(s) within the areas targeted by this assay, and inadequate number of viral copies (<131 copies/mL). A negative result must be combined with clinical observations, patient history, and epidemiological information. The expected result is Negative. Fact Sheet for Patients:  PinkCheek.be Fact Sheet for Healthcare Providers:  GravelBags.it This test is not yet ap proved or cleared by the [place] FDA and  has been authorized for detection and/or diagnosis of SARS-CoV-2 by FDA under an Emergency Use Authorization (EUA). This EUA will remain  in effect (meaning this test can be used) for the duration of the COVID-19 declaration under Section 564(b)(1) of the Act, 21 U.S.C. section 360bbb-3(b)(1), unless the authorization is terminated or revoked sooner.    Influenza A by PCR NEGATIVE NEGATIVE Final   Influenza B by PCR NEGATIVE NEGATIVE Final    Comment: (NOTE) The Xpert Xpress SARS-CoV-2/FLU/RSV assay is intended as an aid in  the diagnosis of influenza from Nasopharyngeal swab specimens and  should not be used as a sole basis for treatment. Nasal washings and  aspirates are unacceptable for Xpert Xpress SARS-CoV-2/FLU/RSV  testing. Fact Sheet for Patients: PinkCheek.be Fact  Sheet for Healthcare Providers: GravelBags.it This test is not yet approved or cleared by the [place] FDA and  has been authorized for detection and/or diagnosis of SARS-CoV-2 by  FDA under an Emergency Use Authorization (EUA). This EUA will remain  in effect (meaning this test can be used) for the duration of the  Covid-19 declaration under Section 564(b)(1) of the Act, 21  U.S.C. section 360bbb-3(b)(1), unless the authorization is  terminated or revoked. Performed at [facility], [place] [address], [place], [place] [address]      Radiological Exams on Admission: DG Chest 2 View  Addendum Date: 12/10/2019   ADDENDUM REPORT: 12/10/2019 18:53 ADDENDUM: More nodular opacity in the right hilum, favor overlapping vessels though adenopathy or a pulmonary nodule cannot be fully excluded. Could consider further evaluation cross-sectional imaging. Notification of the addendum submission with skull by telephone on 12/10/2019 at 6:52 pm to the nurse of provider [facility] , who verbally acknowledged these results. Electronically Signed   By: Lovena Le M.D.   On: 12/10/2019 18:53   Result Date: 12/10/2019 CLINICAL DATA:  Cough EXAM: CHEST - 2 VIEW COMPARISON:  Radiograph 12/09/2019, CT 06/11/2017 FINDINGS: Mild airways thickening with some increasing hazy and interstitial opacities in the lungs. More focal opacity in right hilum may reflect overlapping vessels though is overall indeterminate. Nodules seen on comparison CT from 2018 are not well visualized on this radiographic examination. Stable mild cardiomegaly. The aorta is calcified. The remaining cardiomediastinal contours are unremarkable. No acute osseous or soft tissue abnormality. Degenerative changes are present in the imaged spine and shoulders. Surgical clips at the base of the neck may reflect prior thyroidectomy. Telemetry leads overlie the chest. IMPRESSION: Mild airways thickening with  some increasing hazy and interstitial opacities in the lungs. Findings may reflect bronchitis, edema or atypical infection. Electronically Signed: By: Lovena Le M.D. On: 12/10/2019 18:34   DG Chest Port 1 View  Result Date: 12/09/2019 CLINICAL DATA:  Generalized weakness for 2 days, coughing, rhinorrhea EXAM:  PORTABLE CHEST 1 VIEW COMPARISON:  10/10/2018 FINDINGS: Single frontal view of the chest demonstrates an enlarged cardiac silhouette. No acute airspace disease, effusion, or pneumothorax. Chronic background scarring. IMPRESSION: 1. Stable exam, no acute process. Electronically Signed   By: Randa Ngo M.D.   On: 12/09/2019 19:04    Assessment/Plan   1. Acute on chronic diastolic CHF; acute hypoxic respiratory failure  - Presents with several days of progressive SOB and fatigue with O2 saturation dropping to 70s-80s with mild exertion  - She has developed orthopnea, has interstitial opacities bilaterally on CXR felt to reflect edema  - Diurese with Lasix 40 mg IV q12h, follow I/O's and daily wt, continue ARB and beta-blocker    2. Atrial fibrillation  - CHADS-VASc is 61 (age x2, gender, CAD, HTN, CHF)  - Continue Eliquis, beta-blocker    3. Hypertension  - BP at goal, continue atenolol, ARB, and Norvasc    4. Hypothyroidism  - Continue Synthroid   5. CAD  - No anginal complaints  - Continue nitrates, beta-blocker, ARB    DVT prophylaxis: Eliquis   Code Status: DNR  Family Communication: Discussed with patient  Disposition Plan:  Patient is from: Home  Anticipated d/c is to: Home  Anticipated d/c date is: 12/12/19 Patient currently: Requiring IV diuresis for new oxygen requirement  Consults called: none  Admission status: Observation    Vianne Bulls, MD [facility] Pager: See [email or web address]  If 7AM-7PM, please contact the daytime attending [email or web address]  12/11/2019, 12:05 AM

## 2019-12-10 NOTE — ED Notes (Addendum)
PT ambulated around the room multiple times while on SpO2 monitoring, PT maintained between 95% and dropped to lower 70's and maintained between 70's and 80% while walking, maintained steady waveform, PT did not need any assistance with use of cane, PT denied any abnormalities with walking, PT placed on 2 liters of oxygen via nasal cannula.

## 2019-12-10 NOTE — ED Notes (Signed)
Pt states she tried to go to the clinic to seek relief for her throat issues. Dr at clinic stated that he heard something in her lungs and should come in to be checked out.

## 2019-12-10 NOTE — ED Notes (Signed)
PT ambulated to restroom with assistance of cane without incident.

## 2019-12-10 NOTE — Telephone Encounter (Signed)
Patient's friend, Hilda Blades, states she is calling to follow up in regards to patient's oxygen. She states she is requesting oxygen for the patient. Please return call to discuss.

## 2019-12-10 NOTE — ED Provider Notes (Signed)
[facility] DEPT Provider Note   CSN: [id number] Arrival date & time: 12/10/19  1546     History Chief Complaint  Patient presents with  . Shortness of Breath  . Sore throat    Kristen Whitehead is a 86 y.o. female.  HPI     86 year old female with history of chronic A. fib comes in a chief complaint of shortness of breath, change in voice, worsening cough.  Patient reports that she started getting sick about couple of days ago.  She called her PCP and they advised her to come to the ER.  Patient was seen in the ER yesterday and diagnosed with CHF and UTI.  Patient elected to go home when given the choice, but she felt miserable at home.  She had persistent cough, continued throat irritation and weakness.  She reports maybe a couple pound of weight gain.  She reports new requirement for pillow.  She has no new leg swelling.  No history of DVT, PE.  Patient lives by herself, and does not feel comfortable going home.  She has received both of her COVID-19 vaccine.  Past Medical History:  Diagnosis Date  . Bladder prolapse, female, acquired   . Cancer ([place])    thyroid  . Chest pain    no known ischemic heart disease; negative Myoview July 2013. EF 75% with no ischemia.   . Chronic anticoagulation   . Chronic atrial fibrillation (HCC)    managed with rate control and coumadin  . Chronic diastolic heart failure ([place])   . GERD (gastroesophageal reflux disease)   . Heart disease   . History of congenital mitral regurgitation    mild  . History of mitral valve prolapse 06/15/2008   a. echo 1/14: mild LVH, EF 60-65%, mild MR, mild to mod BAE, PASP 35  . Hypercholesterolemia   . Hypertension   . Hypothyroidism     Patient Active Problem List   Diagnosis Date Noted  . Localized swelling, mass and lump, upper limb 05/06/2019  . Disorder of thyroid gland 09/14/2018  . Malignant tumor of thyroid gland ([place]) 09/14/2018  . Heart disease 09/14/2018  .  Chronic anticoagulation 11/18/2016  . Weakness 11/10/2014  . Chronic diastolic heart failure ([place]) 02/01/2014  . Dyspnea 02/01/2014  . Encounter for therapeutic drug monitoring 09/16/2013  . Chest pain at rest 03/12/2012  . Cough 09/16/2011  . Fatigue 04/08/2011  . Essential hypertension 02/11/2011  . Mitral regurgitation 02/11/2011  . Hypothyroidism 02/11/2011  . Hypercholesterolemia 02/11/2011  . Atrial fibrillation ([place]) 12/03/2010    Past Surgical History:  Procedure Laterality Date  . ABDOMINAL HYSTERECTOMY    . CATARACT EXTRACTION    . CHOLECYSTECTOMY    . Thyroidectomy       OB History   No obstetric history on file.     Family History  Problem Relation Age of Onset  . Stroke Mother   . Stroke Father   . Heart attack Neg Hx   . Heart disease Neg Hx     Social History   Tobacco Use  . Smoking status: Never Smoker  . Smokeless tobacco: Never Used  Substance Use Topics  . Alcohol use: No  . Drug use: No    Home Medications Prior to Admission medications   Medication Sig Start Date End Date Taking? Authorizing Provider  amLODipine (NORVASC) 2.5 MG tablet Take 2 tablets (5 mg total) by mouth daily. 11/09/19  Yes [name], MD  apixaban Arne Cleveland) 2.5  MG TABS tablet Take 1 tablet (2.5 mg total) by mouth 2 (two) times daily. 12/08/19  Yes [name], MD  atenolol (TENORMIN) 50 MG tablet Take 1 tablet (50 mg total) by mouth 2 (two) times daily. 11/16/19 02/14/20 Yes [name], MD  Calcium Carbonate-Vitamin D (CALCIUM 600+D PO) Take 1 tablet by mouth 2 (two) times daily.   Yes [provider]  cephALEXin (KEFLEX) 500 MG capsule Take 1 capsule (500 mg total) by mouth 2 (two) times daily for 5 days. 12/09/19 12/14/19 Yes Carmin Muskrat, MD  Cholecalciferol (VITAMIN D) 2000 UNITS tablet Take 4,000 Units by mouth daily.    Yes [provider]  dorzolamide (TRUSOPT) 2 % ophthalmic solution Place 1 drop into the left eye 2 (two) times  daily. 09/24/19  Yes [provider]  furosemide (LASIX) 40 MG tablet Take 0.5-1 tablets (20-40 mg total) by mouth 2 (two) times daily. 40mg   in the morning, 20mg  in afternoon. OK TO USE EXTRA 20 MG IN AFTERNOON IF NEEDED 12/08/19  Yes [name], MD  irbesartan (AVAPRO) 300 MG tablet Take 0.5 tablets (150 mg total) by mouth 2 (two) times daily. 11/16/19  Yes [name], MD  isosorbide mononitrate (IMDUR) 30 MG 24 hr tablet Take 1 tablet (30 mg total) by mouth daily. 12/08/19  Yes [name], MD  latanoprost (XALATAN) 0.005 % ophthalmic solution Place 1 drop into both eyes at bedtime.  02/23/13  Yes [provider]  levothyroxine (SYNTHROID, LEVOTHROID) 100 MCG tablet Take 100 mcg by mouth daily.  04/20/15  Yes [provider]  MAGNESIUM OXIDE 400 PO Take 800 mg by mouth daily.   Yes [provider]  Multiple Vitamins-Minerals (CENTRUM SILVER PO) Take 1 tablet by mouth daily.   Yes [provider]  Multiple Vitamins-Minerals (PRESERVISION AREDS 2 PO) Take 1 capsule by mouth 2 (two) times daily.    Yes [provider]  nitroGLYCERIN (NITROSTAT) 0.4 MG SL tablet DISSOLVE 1 TABLET UNDER THE TONGUE EVERY 5 MINUTES FOR 3 DOSES AS NEEDED  FOR  CHEST  PAIN 12/23/18  Yes [name], MD  omeprazole (PRILOSEC) 40 MG capsule Take 1 capsule (40 mg total) by mouth daily. 12/08/19  Yes [name], MD  potassium chloride (KLOR-CON) 10 MEQ tablet Take 1 tablet (10 mEq total) by mouth daily. 12/08/19  Yes [name], MD  pramipexole (MIRAPEX) 0.125 MG tablet Take 1 tablet (0.125 mg total) by mouth every evening. 11/29/19  Yes Star Age, MD  PREMARIN vaginal cream Place 2 g vaginally once a week. Uses on Sunday and wednesday 03/25/18  Yes [provider]  Probiotic Product (PROBIOTIC DAILY PO) Take 1 tablet by mouth daily.   Yes [provider]  temazepam (RESTORIL) 15 MG capsule Take 15 mg by mouth at bedtime.   Yes  [provider]  benzonatate (TESSALON) 100 MG capsule Take 1 capsule (100 mg total) by mouth every 8 (eight) hours. 12/10/19   Varney Biles, MD  predniSONE (DELTASONE) 10 MG tablet Take 5 tablets (50 mg total) by mouth daily. 12/10/19   Varney Biles, MD    Allergies    Azithromycin, Compazine [prochlorperazine edisylate], Demerol, Dolophine [methadone], Ebastine, Erythromycin, Ibuprofen, Statins, Tetanus toxoids, and Tetracyclines & related  Review of Systems   Review of Systems  Constitutional: Positive for activity change.  HENT: Positive for congestion, sore throat and voice change.   Respiratory: Positive for cough and shortness of breath. Negative for wheezing.   Cardiovascular: Negative for chest  pain.  Neurological: Positive for dizziness.  All other systems reviewed and are negative.   Physical Exam Updated Vital Signs BP 123/62   Pulse 68   Resp (!) 30   Ht 4\' 11"  (1.499 m)   SpO2 92%   BMI 26.66 kg/m   Physical Exam Vitals and nursing note reviewed.  Constitutional:      Appearance: She is well-developed.  HENT:     Head: Normocephalic and atraumatic.  Cardiovascular:     Rate and Rhythm: Normal rate.  Pulmonary:     Effort: Pulmonary effort is normal.     Breath sounds: Decreased breath sounds present. No wheezing, rhonchi or rales.  Abdominal:     General: Bowel sounds are normal.  Musculoskeletal:     Cervical back: Normal range of motion and neck supple.     Right lower leg: No edema.     Left lower leg: No edema.  Skin:    General: Skin is warm and dry.  Neurological:     Mental Status: She is alert and oriented to person, place, and time.     ED Results / Procedures / Treatments   Labs (all labs ordered are listed, but only abnormal results are displayed) Labs Reviewed  CBC WITH DIFFERENTIAL/PLATELET - Abnormal; Notable for the following components:      Result Value   RBC 3.82 (*)    All other components within normal limits    BRAIN NATRIURETIC PEPTIDE - Abnormal; Notable for the following components:   B Natriuretic Peptide 511.4 (*)    All other components within normal limits  BASIC METABOLIC PANEL - Abnormal; Notable for the following components:   Sodium 133 (*)    Potassium 3.4 (*)    Chloride 95 (*)    Glucose, Bld 101 (*)    Calcium 7.6 (*)    All other components within normal limits  RESPIRATORY PANEL BY RT PCR (FLU A&B, COVID)    EKG None  Radiology DG Chest 2 View  Addendum Date: 12/10/2019   ADDENDUM REPORT: 12/10/2019 18:53 ADDENDUM: More nodular opacity in the right hilum, favor overlapping vessels though adenopathy or a pulmonary nodule cannot be fully excluded. Could consider further evaluation cross-sectional imaging. Notification of the addendum submission with skull by telephone on 12/10/2019 at 6:52 pm to the nurse of provider [facility] , who verbally acknowledged these results. Electronically Signed   By: Lovena Le M.D.   On: 12/10/2019 18:53   Result Date: 12/10/2019 CLINICAL DATA:  Cough EXAM: CHEST - 2 VIEW COMPARISON:  Radiograph 12/09/2019, CT 06/11/2017 FINDINGS: Mild airways thickening with some increasing hazy and interstitial opacities in the lungs. More focal opacity in right hilum may reflect overlapping vessels though is overall indeterminate. Nodules seen on comparison CT from 2018 are not well visualized on this radiographic examination. Stable mild cardiomegaly. The aorta is calcified. The remaining cardiomediastinal contours are unremarkable. No acute osseous or soft tissue abnormality. Degenerative changes are present in the imaged spine and shoulders. Surgical clips at the base of the neck may reflect prior thyroidectomy. Telemetry leads overlie the chest. IMPRESSION: Mild airways thickening with some increasing hazy and interstitial opacities in the lungs. Findings may reflect bronchitis, edema or atypical infection. Electronically Signed: By: Lovena Le M.D. On:  12/10/2019 18:34   DG Chest Port 1 View  Result Date: 12/09/2019 CLINICAL DATA:  Generalized weakness for 2 days, coughing, rhinorrhea EXAM: PORTABLE CHEST 1 VIEW COMPARISON:  10/10/2018 FINDINGS: Single frontal view of  the chest demonstrates an enlarged cardiac silhouette. No acute airspace disease, effusion, or pneumothorax. Chronic background scarring. IMPRESSION: 1. Stable exam, no acute process. Electronically Signed   By: Randa Ngo M.D.   On: 12/09/2019 19:04    Procedures Procedures (including critical care time)  Medications Ordered in ED Medications  benzonatate (TESSALON) capsule 200 mg (200 mg Oral Given 12/10/19 1940)  albuterol (VENTOLIN HFA) 108 (90 Base) MCG/ACT inhaler 2 puff (2 puffs Inhalation Given 12/10/19 1942)    ED Course  I have reviewed the triage vital signs and the nursing notes.  Pertinent labs & imaging results that were available during my care of the patient were reviewed by me and considered in my medical decision making (see chart for details).    MDM Rules/Calculators/A&P                      86 year old comes in a chief complaint of cough, shortness of breath.  She is essentially having URI-like symptoms and was diagnosed with UTI and CHF yesterday. On exam there is mild JVD, no pitting edema.  Differential diagnosis includes mild CHF exacerbation.  Patient reports that when she walks her O2 sats drops to 80s, but she is typically asymptomatic.  She is having worsening orthopnea as well.  Additionally she reports worsening cough.  On exam she has diminished breath sounds diffusely.  Symptoms could be because of the CHF or it could be because of bronchitis.  Chest x-ray ordered and then revealed bronchitis-like picture and there is also a pulmonary nodule.  Results of the work-up discussed with the patient.  She is frail and prefers that she be admitted to the hospital even if it is as an observation case.  She is not comfortable going home by  herself.  Final Clinical Impression(s) / ED Diagnoses Final diagnoses:  Bronchitis  Pulmonary nodule  Acute cystitis without hematuria    Rx / DC Orders ED Discharge Orders         Ordered    predniSONE (DELTASONE) 10 MG tablet  Daily     12/10/19 2038    benzonatate (TESSALON) 100 MG capsule  Every 8 hours     12/10/19 2038           Varney Biles, MD 12/10/19 2047

## 2019-12-10 NOTE — Telephone Encounter (Signed)
Returned call to friend (patient with [facility], permission to speak with her provided)-she states patient went to ER yesterday because she was SOB and "cant breathe".    She states her oxygen stays in the low 90's and drops into the 70s intermittently.   Patient speaking to nurse, speaking in full sentences, voice is very soft.     Friend is requesting a inhaler and oxygen to be ordered.    States she called her PCP and requested this and they deferred to cardiology.    OV scheduled first avaliable on Tuesday 4/27 with Coletta Memos NP to discuss and evaluate (ER note notable for worsening CHF).  Advised if symptoms are worse of worsen since yesterday ER visit, then need to proceed to ER for evaluation and treatment.    Also advised to call PCP for OV as well to evaluate non-cardiac related issues (possible UTI noted in ER).   They are aware and verbalized understanding.

## 2019-12-10 NOTE — Plan of Care (Signed)

## 2019-12-11 ENCOUNTER — Observation Stay (HOSPITAL_COMMUNITY): Payer: Medicare Other

## 2019-12-11 DIAGNOSIS — J40 Bronchitis, not specified as acute or chronic: Secondary | ICD-10-CM | POA: Diagnosis present

## 2019-12-11 DIAGNOSIS — I480 Paroxysmal atrial fibrillation: Secondary | ICD-10-CM | POA: Diagnosis not present

## 2019-12-11 DIAGNOSIS — I5031 Acute diastolic (congestive) heart failure: Secondary | ICD-10-CM

## 2019-12-11 DIAGNOSIS — Z885 Allergy status to narcotic agent status: Secondary | ICD-10-CM | POA: Diagnosis not present

## 2019-12-11 DIAGNOSIS — I5032 Chronic diastolic (congestive) heart failure: Secondary | ICD-10-CM

## 2019-12-11 DIAGNOSIS — G4700 Insomnia, unspecified: Secondary | ICD-10-CM | POA: Diagnosis present

## 2019-12-11 DIAGNOSIS — I509 Heart failure, unspecified: Secondary | ICD-10-CM

## 2019-12-11 DIAGNOSIS — I110 Hypertensive heart disease with heart failure: Secondary | ICD-10-CM | POA: Diagnosis present

## 2019-12-11 DIAGNOSIS — Z7901 Long term (current) use of anticoagulants: Secondary | ICD-10-CM | POA: Diagnosis not present

## 2019-12-11 DIAGNOSIS — Z881 Allergy status to other antibiotic agents status: Secondary | ICD-10-CM | POA: Diagnosis not present

## 2019-12-11 DIAGNOSIS — Z66 Do not resuscitate: Secondary | ICD-10-CM | POA: Diagnosis present

## 2019-12-11 DIAGNOSIS — Z7952 Long term (current) use of systemic steroids: Secondary | ICD-10-CM | POA: Diagnosis not present

## 2019-12-11 DIAGNOSIS — I482 Chronic atrial fibrillation, unspecified: Secondary | ICD-10-CM | POA: Diagnosis present

## 2019-12-11 DIAGNOSIS — I2510 Atherosclerotic heart disease of native coronary artery without angina pectoris: Secondary | ICD-10-CM | POA: Diagnosis present

## 2019-12-11 DIAGNOSIS — Z20822 Contact with and (suspected) exposure to covid-19: Secondary | ICD-10-CM | POA: Diagnosis present

## 2019-12-11 DIAGNOSIS — N3000 Acute cystitis without hematuria: Secondary | ICD-10-CM

## 2019-12-11 DIAGNOSIS — Z79899 Other long term (current) drug therapy: Secondary | ICD-10-CM | POA: Diagnosis not present

## 2019-12-11 DIAGNOSIS — Z887 Allergy status to serum and vaccine status: Secondary | ICD-10-CM | POA: Diagnosis not present

## 2019-12-11 DIAGNOSIS — K219 Gastro-esophageal reflux disease without esophagitis: Secondary | ICD-10-CM | POA: Diagnosis present

## 2019-12-11 DIAGNOSIS — J9601 Acute respiratory failure with hypoxia: Secondary | ICD-10-CM | POA: Diagnosis present

## 2019-12-11 DIAGNOSIS — E871 Hypo-osmolality and hyponatremia: Secondary | ICD-10-CM | POA: Diagnosis present

## 2019-12-11 DIAGNOSIS — R627 Adult failure to thrive: Secondary | ICD-10-CM | POA: Diagnosis present

## 2019-12-11 DIAGNOSIS — Z79890 Hormone replacement therapy (postmenopausal): Secondary | ICD-10-CM | POA: Diagnosis not present

## 2019-12-11 DIAGNOSIS — E876 Hypokalemia: Secondary | ICD-10-CM | POA: Diagnosis present

## 2019-12-11 DIAGNOSIS — J040 Acute laryngitis: Secondary | ICD-10-CM | POA: Diagnosis present

## 2019-12-11 DIAGNOSIS — E890 Postprocedural hypothyroidism: Secondary | ICD-10-CM | POA: Diagnosis present

## 2019-12-11 DIAGNOSIS — R911 Solitary pulmonary nodule: Secondary | ICD-10-CM | POA: Diagnosis present

## 2019-12-11 DIAGNOSIS — G2581 Restless legs syndrome: Secondary | ICD-10-CM | POA: Diagnosis present

## 2019-12-11 DIAGNOSIS — I5021 Acute systolic (congestive) heart failure: Secondary | ICD-10-CM | POA: Diagnosis not present

## 2019-12-11 DIAGNOSIS — I5033 Acute on chronic diastolic (congestive) heart failure: Secondary | ICD-10-CM | POA: Diagnosis present

## 2019-12-11 LAB — BASIC METABOLIC PANEL
Anion gap: 11 (ref 5–15)
BUN: 13 mg/dL (ref 8–23)
CO2: 27 mmol/L (ref 22–32)
Calcium: 7.5 mg/dL — ABNORMAL LOW (ref 8.9–10.3)
Chloride: 100 mmol/L (ref 98–111)
Creatinine, Ser: 0.67 mg/dL (ref 0.44–1.00)
GFR calc Af Amer: >60 mL/min (ref >60)
GFR calc non Af Amer: >60 mL/min (ref >60)
Glucose, Bld: 101 mg/dL — ABNORMAL HIGH (ref 70–99)
Potassium: 3.7 mmol/L (ref 3.5–5.1)
Sodium: 138 mmol/L (ref 135–145)

## 2019-12-11 LAB — ECHOCARDIOGRAM COMPLETE
Height: 59 in
Weight: 1984 oz

## 2019-12-11 LAB — MAGNESIUM: Magnesium: 2 mg/dL (ref 1.7–2.4)

## 2019-12-11 MED ORDER — MENTHOL 3 MG MT LOZG
1.0000 | LOZENGE | OROMUCOSAL | Status: DC | PRN
  Filled 2019-12-11: qty 9

## 2019-12-11 MED ORDER — MELATONIN 3 MG PO TABS: 3.0000 mg | ORAL_TABLET | Freq: Every evening | ORAL | Status: DC | PRN

## 2019-12-11 MED ORDER — PRAMIPEXOLE DIHYDROCHLORIDE 0.25 MG PO TABS
0.5000 mg | ORAL_TABLET | Freq: Every day | ORAL | Status: DC
  Administered 2019-12-11: 0.5 mg via ORAL
  Filled 2019-12-11: qty 2

## 2019-12-11 MED ORDER — GUAIFENESIN-CODEINE 100-10 MG/5ML PO SOLN
5.0000 mL | Freq: Four times a day (QID) | ORAL | Status: DC | PRN
  Administered 2019-12-11 – 2019-12-12 (×2): 5 mL via ORAL
  Filled 2019-12-11 (×2): qty 5

## 2019-12-11 MED ORDER — LORATADINE 10 MG PO TABS
10.0000 mg | ORAL_TABLET | Freq: Every day | ORAL | Status: DC
  Administered 2019-12-11 – 2019-12-15 (×5): 10 mg via ORAL
  Filled 2019-12-11 (×5): qty 1

## 2019-12-11 MED ORDER — BENZONATATE 100 MG PO CAPS
200.0000 mg | ORAL_CAPSULE | Freq: Three times a day (TID) | ORAL | Status: DC
  Administered 2019-12-11 – 2019-12-15 (×13): 200 mg via ORAL
  Filled 2019-12-11 (×13): qty 2

## 2019-12-11 MED ORDER — AMOXICILLIN 250 MG PO CAPS
500.0000 mg | ORAL_CAPSULE | Freq: Three times a day (TID) | ORAL | Status: DC
  Administered 2019-12-11 – 2019-12-15 (×13): 500 mg via ORAL
  Filled 2019-12-11 (×13): qty 2

## 2019-12-11 NOTE — Progress Notes (Signed)
[facility]                                                                              Patient Demographics  Kristen Whitehead, is a 86 y.o. female, DOB - 1923-07-19, [id number]  Admit date - 12/10/2019   Admitting Physician Vianne Bulls, MD  Outpatient Primary MD for the patient is Leighton Ruff, MD  Outpatient specialists:   LOS - 0  days   Medical records reviewed and are as summarized below:    Chief Complaint  Patient presents with  . Shortness of Breath  . Sore throat       Brief summary   Patient is a 86 year old female with history of A. fib on Eliquis, CAD, hypertension, hypothyroidism presented to ED with shortness of breath and fatigue.  Patient was seen in ED few days ago and was started on Keflex for suspected UTI and was given a dose of IV Lasix.  Patient continued to feel short of breath, weak requiring an extra pillow for the last few nights to sleep without becoming too dyspneic.  Denied any fevers or chills but reported a mild cough.  In ED, O2 sats down to mid 80s on room air, tachypneic.  Chest x-ray showed mild hazy interstitial opacities bilaterally.  Covid and influenza PCR negative  Assessment & Plan    Principal Problem:   Acute respiratory failure with hypoxia (HCC) likely secondary to acute on chronic diastolic CHF -Presented with several days of progressive dyspnea, fatigue, hypoxia, O2 sats dropping to 70s with mild exertion.  Chest x-ray with pulmonary edema -Continue Lasix 40 mg IV every 12 hours, follow strict I's and O's and daily weights. -2D echo done today, results pending -Home O2 evaluation prior to discharge  Active Problems:   Atrial fibrillation (HCC) -CHA2DS2-VASc 6  -Continue Eliquis, beta-blocker  Acute laryngitis -Patient also reported laryngitis for the last 3 days, worsens with cough.  Difficulty speaking due to throat pain -Placed on Claritin, Tessalon Perles, antitussives, Cepacol -place on  amoxicillin for acute laryngitis    Essential hypertension BP currently stable    Hypothyroidism -Continue Synthroid    Acute cystitis without hematuria Patient was placed on Keflex on 4/22, day #3 today No urine culture was unfortunately sent.  Ordered urine culture however has been on antibiotics  CAD No anginal symptoms Continue nitrates, beta-blocker, ER  Code Status: DNR DVT Prophylaxis: Eliquis Family Communication: Discussed all imaging results, lab results, explained to the patient    Disposition Plan:     Status is: Observation  The patient will require care spanning > 2 midnights and should be moved to inpatient because: Inpatient level of care appropriate due to severity of illness  Dispo: The patient is from: Home              Anticipated d/c is to: Home              Anticipated d/c date is: 2 days              Patient currently is not medically stable to d/c.      Time Spent in minutes  12mins    Procedures:  2D echo  Consultants:   None  Antimicrobials:   Anti-infectives (From admission, onward)   Start     Dose/Rate Route Frequency Ordered Stop   12/11/19 0015  cephALEXin (KEFLEX) capsule 500 mg     500 mg Oral 2 times daily 12/10/19 2321            Medications  Scheduled Meds: . amLODipine  5 mg Oral Daily  . apixaban  2.5 mg Oral BID  . atenolol  50 mg Oral BID  . benzonatate  200 mg Oral TID  . cephALEXin  500 mg Oral BID  . dorzolamide  1 drop Left Eye BID  . furosemide  40 mg Intravenous Q12H  . irbesartan  150 mg Oral BID  . isosorbide mononitrate  30 mg Oral Daily  . latanoprost  1 drop Both Eyes QHS  . levothyroxine  100 mcg Oral Q0600  . pantoprazole  40 mg Oral Daily  . potassium chloride  10 mEq Oral Daily  . pramipexole  0.5 mg Oral QHS  . sodium chloride flush  3 mL Intravenous Q12H  . temazepam  15 mg Oral QHS   Continuous Infusions: . sodium chloride     PRN Meds:.sodium chloride, acetaminophen,  guaiFENesin-codeine, melatonin, menthol-cetylpyridinium, ondansetron (ZOFRAN) IV, sodium chloride flush      Subjective:   Kristen Whitehead was seen and examined today.  Speaking very softly, having laryngitis, states coughing makes it worse.  Shortness of breath mildly improving after restarting Lasix.  No chest pain.  No hematuria or dysuria.  Patient denies dizziness, abdominal pain, N/V/D/C, new weakness, numbess, tingling. No acute events overnight.    Objective:   Vitals:   12/11/19 0702 12/11/19 0706 12/11/19 0746 12/11/19 1017  BP:  (!) 111/47  (!) 106/49  Pulse:  74 70 66  Resp:  16 20 20   Temp:  98.6 F (37 C)  98.6 F (37 C)  TempSrc:  Oral  Oral  SpO2:  (!) 87% 93% 95%  Weight: 56.2 kg     Height:        Intake/Output Summary (Last 24 hours) at 12/11/2019 1128 Last data filed at 12/11/2019 1018 Gross per 24 hour  Intake 660 ml  Output 1220 ml  Net -560 ml     Wt Readings from Last 3 Encounters:  12/11/19 56.2 kg  10/18/19 59.9 kg  08/18/19 59.7 kg     Exam  General: Alert and oriented x 3, NAD  Cardiovascular: S1 S2 auscultated, irregularly irregular  Respiratory: Bibasilar Rales  Gastrointestinal: Soft, nontender, nondistended, + bowel sounds  Ext:+ pedal edema bilaterally  Neuro: No new deficits  Musculoskeletal: No digital cyanosis, clubbing  Skin: No rashes  Psych: Normal affect and demeanor, alert and oriented x3    Data Reviewed:  I have personally reviewed following labs and imaging studies  Micro Results Recent Results (from the past 240 hour(s))  Respiratory Panel by RT PCR (Flu A&B, Covid) - Nasopharyngeal Swab     Status: None   Collection Time: 12/10/19  7:23 PM   Specimen: Nasopharyngeal Swab  Result Value Ref Range Status   SARS Coronavirus 2 by RT PCR NEGATIVE NEGATIVE Final    Comment: (NOTE) SARS-CoV-2 target nucleic acids are NOT DETECTED. The SARS-CoV-2 RNA is generally detectable in upper respiratoy specimens  during the acute phase of infection. The lowest concentration of SARS-CoV-2 viral copies this assay can detect is 131 copies/mL. A negative result does not  preclude SARS-Cov-2 infection and should not be used as the sole basis for treatment or other patient management decisions. A negative result may occur with  improper specimen collection/handling, submission of specimen other than nasopharyngeal swab, presence of viral mutation(s) within the areas targeted by this assay, and inadequate number of viral copies (<131 copies/mL). A negative result must be combined with clinical observations, patient history, and epidemiological information. The expected result is Negative. Fact Sheet for Patients:  PinkCheek.be Fact Sheet for Healthcare Providers:  GravelBags.it This test is not yet ap proved or cleared by the [place] FDA and  has been authorized for detection and/or diagnosis of SARS-CoV-2 by FDA under an Emergency Use Authorization (EUA). This EUA will remain  in effect (meaning this test can be used) for the duration of the COVID-19 declaration under Section 564(b)(1) of the Act, 21 U.S.C. section 360bbb-3(b)(1), unless the authorization is terminated or revoked sooner.    Influenza A by PCR NEGATIVE NEGATIVE Final   Influenza B by PCR NEGATIVE NEGATIVE Final    Comment: (NOTE) The Xpert Xpress SARS-CoV-2/FLU/RSV assay is intended as an aid in  the diagnosis of influenza from Nasopharyngeal swab specimens and  should not be used as a sole basis for treatment. Nasal washings and  aspirates are unacceptable for Xpert Xpress SARS-CoV-2/FLU/RSV  testing. Fact Sheet for Patients: PinkCheek.be Fact Sheet for Healthcare Providers: GravelBags.it This test is not yet approved or cleared by the [place] FDA and  has been authorized for detection and/or diagnosis of  SARS-CoV-2 by  FDA under an Emergency Use Authorization (EUA). This EUA will remain  in effect (meaning this test can be used) for the duration of the  Covid-19 declaration under Section 564(b)(1) of the Act, 21  U.S.C. section 360bbb-3(b)(1), unless the authorization is  terminated or revoked. Performed at [facility], [place] [address] [place],  [address]     Radiology Reports DG Chest 2 View  Addendum Date: 12/10/2019   ADDENDUM REPORT: 12/10/2019 18:53 ADDENDUM: More nodular opacity in the right hilum, favor overlapping vessels though adenopathy or a pulmonary nodule cannot be fully excluded. Could consider further evaluation cross-sectional imaging. Notification of the addendum submission with skull by telephone on 12/10/2019 at 6:52 pm to the nurse of provider [facility] , who verbally acknowledged these results. Electronically Signed   By: Lovena Le M.D.   On: 12/10/2019 18:53   Result Date: 12/10/2019 CLINICAL DATA:  Cough EXAM: CHEST - 2 VIEW COMPARISON:  Radiograph 12/09/2019, CT 06/11/2017 FINDINGS: Mild airways thickening with some increasing hazy and interstitial opacities in the lungs. More focal opacity in right hilum may reflect overlapping vessels though is overall indeterminate. Nodules seen on comparison CT from 2018 are not well visualized on this radiographic examination. Stable mild cardiomegaly. The aorta is calcified. The remaining cardiomediastinal contours are unremarkable. No acute osseous or soft tissue abnormality. Degenerative changes are present in the imaged spine and shoulders. Surgical clips at the base of the neck may reflect prior thyroidectomy. Telemetry leads overlie the chest. IMPRESSION: Mild airways thickening with some increasing hazy and interstitial opacities in the lungs. Findings may reflect bronchitis, edema or atypical infection. Electronically Signed: By: Lovena Le M.D. On: 12/10/2019 18:34   DG Chest Port 1  View  Result Date: 12/09/2019 CLINICAL DATA:  Generalized weakness for 2 days, coughing, rhinorrhea EXAM: PORTABLE CHEST 1 VIEW COMPARISON:  10/10/2018 FINDINGS: Single frontal view of the chest demonstrates an enlarged cardiac silhouette. No acute airspace  disease, effusion, or pneumothorax. Chronic background scarring. IMPRESSION: 1. Stable exam, no acute process. Electronically Signed   By: Randa Ngo M.D.   On: 12/09/2019 19:04    Lab Data:  CBC: Recent Labs  Lab 12/09/19 1907 12/10/19 1754  WBC 13.0* 9.6  NEUTROABS 9.9* 6.5  HGB 13.9 12.2  HCT 42.2 36.2  MCV 94.6 94.8  PLT 209 [id number]   Basic Metabolic Panel: Recent Labs  Lab 12/09/19 1907 12/10/19 1754 12/11/19 0403  NA 134* 133* 138  K 4.1 3.4* 3.7  CL 95* 95* 100  CO2 29 29 27   GLUCOSE 107* 101* 101*  BUN 11 14 13   CREATININE 0.69 0.68 0.67  CALCIUM 8.0* 7.6* 7.5*  MG 2.4  --  2.0   GFR: Estimated Creatinine Clearance: 32.1 mL/min (by C-G formula based on SCr of 0.67 mg/dL). Liver Function Tests: Recent Labs  Lab 12/09/19 1907  AST 34  ALT 28  ALKPHOS 134*  BILITOT 1.8*  PROT 7.5  ALBUMIN 4.1   No results for input(s): LIPASE, AMYLASE in the last 168 hours. No results for input(s): AMMONIA in the last 168 hours. Coagulation Profile: No results for input(s): INR, PROTIME in the last 168 hours. Cardiac Enzymes: No results for input(s): CKTOTAL, CKMB, CKMBINDEX, TROPONINI in the last 168 hours. BNP (last 3 results) Recent Labs    01/05/19 1216  PROBNP 2,323*   HbA1C: No results for input(s): HGBA1C in the last 72 hours. CBG: No results for input(s): GLUCAP in the last 168 hours. Lipid Profile: No results for input(s): CHOL, HDL, LDLCALC, TRIG, CHOLHDL, LDLDIRECT in the last 72 hours. Thyroid Function Tests: No results for input(s): TSH, T4TOTAL, FREET4, T3FREE, THYROIDAB in the last 72 hours. Anemia Panel: No results for input(s): VITAMINB12, FOLATE, FERRITIN, TIBC, IRON, RETICCTPCT in the  last 72 hours. Urine analysis:    Component Value Date/Time   COLORURINE STRAW (A) 12/09/2019 1907   APPEARANCEUR CLEAR 12/09/2019 1907   LABSPEC 1.002 (L) 12/09/2019 1907   PHURINE 8.0 12/09/2019 1907   GLUCOSEU NEGATIVE 12/09/2019 1907   GLUCOSEU NEGATIVE 06/01/2012 1228   HGBUR MODERATE (A) 12/09/2019 1907   BILIRUBINUR NEGATIVE 12/09/2019 1907   BILIRUBINUR neg 03/21/2013 [place] 12/09/2019 1907   PROTEINUR NEGATIVE 12/09/2019 1907   UROBILINOGEN 0.2 11/10/2014 2144   NITRITE NEGATIVE 12/09/2019 1907   LEUKOCYTESUR LARGE (A) 12/09/2019 1907     Shalen Petrak M.D. [facility] 12/11/2019, 11:28 AM   Call night coverage person covering after 7pm

## 2019-12-11 NOTE — Progress Notes (Signed)
Patient's reading glasses are at home, placed patient's breakfast order per patient request via telephone at 0730 for Oatmeal, bacon, and coffee with sweet n low and cream. Per dietary oatmeal is not an option for the gluten free diet, substituted with grits and potatoes, and added peaches to ensure patient had a variety of alternatives.

## 2019-12-11 NOTE — Progress Notes (Signed)
  Echocardiogram 2D Echocardiogram has been performed.  Jannett Celestine 12/11/2019, 10:08 AM

## 2019-12-11 NOTE — Progress Notes (Signed)
Attempted echo.  Patient eating.

## 2019-12-12 DIAGNOSIS — I5031 Acute diastolic (congestive) heart failure: Secondary | ICD-10-CM

## 2019-12-12 LAB — BASIC METABOLIC PANEL
Anion gap: 11 (ref 5–15)
BUN: 14 mg/dL (ref 8–23)
CO2: 28 mmol/L (ref 22–32)
Calcium: 7.2 mg/dL — ABNORMAL LOW (ref 8.9–10.3)
Chloride: 98 mmol/L (ref 98–111)
Creatinine, Ser: 0.71 mg/dL (ref 0.44–1.00)
GFR calc Af Amer: >60 mL/min (ref >60)
GFR calc non Af Amer: >60 mL/min (ref >60)
Glucose, Bld: 101 mg/dL — ABNORMAL HIGH (ref 70–99)
Potassium: 3.3 mmol/L — ABNORMAL LOW (ref 3.5–5.1)
Sodium: 137 mmol/L (ref 135–145)

## 2019-12-12 MED ORDER — POTASSIUM CHLORIDE CRYS ER 20 MEQ PO TBCR
40.0000 meq | EXTENDED_RELEASE_TABLET | Freq: Once | ORAL | Status: AC
  Administered 2019-12-12: 40 meq via ORAL
  Filled 2019-12-12: qty 2

## 2019-12-12 MED ORDER — PRAMIPEXOLE DIHYDROCHLORIDE 0.25 MG PO TABS
0.2500 mg | ORAL_TABLET | Freq: Two times a day (BID) | ORAL | Status: DC
  Administered 2019-12-12 – 2019-12-15 (×7): 0.25 mg via ORAL
  Filled 2019-12-12 (×7): qty 1

## 2019-12-12 MED ORDER — HYDROCOD POLST-CPM POLST ER 10-8 MG/5ML PO SUER
5.0000 mL | Freq: Two times a day (BID) | ORAL | Status: DC | PRN
  Administered 2019-12-12: 5 mL via ORAL
  Filled 2019-12-12: qty 5

## 2019-12-12 MED ORDER — SODIUM CHLORIDE (HYPERTONIC) 5 % OP SOLN
1.0000 [drp] | Freq: Three times a day (TID) | OPHTHALMIC | Status: DC
  Administered 2019-12-12 – 2019-12-15 (×8): 1 [drp] via OPHTHALMIC

## 2019-12-12 NOTE — Progress Notes (Signed)
[facility]                                                                              Patient Demographics  Kristen Whitehead, is a 86 y.o. female, DOB - Mar 25, 1923, [id number]  Admit date - 12/10/2019   Admitting Physician Kristen Bulls, MD  Outpatient Primary MD for the patient is Kristen Ruff, MD  Outpatient specialists:   LOS - 1  days   Medical records reviewed and are as summarized below:    Chief Complaint  Patient presents with  . Shortness of Breath  . Sore throat       Brief summary   Patient is a 86 year old female with history of A. fib on Eliquis, CAD, hypertension, hypothyroidism presented to ED with shortness of breath and fatigue.  Patient was seen in ED few days ago and was started on Keflex for suspected UTI and was given a dose of IV Lasix.  Patient continued to feel short of breath, weak requiring an extra pillow for the last few nights to sleep without becoming too dyspneic.  Denied any fevers or chills but reported a mild cough.  In ED, O2 sats down to mid 80s on room air, tachypneic.  Chest x-ray showed mild hazy interstitial opacities bilaterally.  Covid and influenza PCR negative  Assessment & Plan    Principal Problem:   Acute respiratory failure with hypoxia (HCC) likely secondary to acute on chronic diastolic CHF -Presented with several days of progressive dyspnea, fatigue, hypoxia, O2 sats dropping to 70s with mild exertion.  Chest x-ray with pulmonary edema -40 mg every 12 hours, negative balance of 1.43 L  -Continue strict I's and O's and daily weights.   2D echo showed EF of 70 to 75%, no regional wall motion abnormalities, diastolic parameters indeterminate -Home O2 evaluation prior to discharge  Active Problems:   Atrial fibrillation (HCC) -CHA2DS2-VASc 6  -Continue Eliquis, beta-blocker  Acute laryngitis -Patient also reported laryngitis for the last 3 days, worsens with cough.  Difficulty speaking due to  throat pain, improving -Continue Claritin, Tessalon Perles, Cepacol -States coughing too much with Robitussin, changed to Tussionex -place on amoxicillin for acute laryngitis    Essential hypertension BP currently stable    Hypothyroidism -Continue Synthroid    Acute cystitis without hematuria Patient was placed on Keflex on 4/22, completed Keflex on 4/24, currently on amoxicillin No urine culture was unfortunately sent.  Urine culture showing gram-negative rods, sensitivities pending  CAD No anginal symptoms Continue nitrates, beta-blocker, ER  Generalized debility, failure to thrive -PT OT evaluation, lives alone  Restless leg syndrome, cramps -Replace potassium States did not have any cramps overnight but did have cramps during the day yesterday, changed pramipexole to 0.25 mg twice daily  Code Status: DNR DVT Prophylaxis: Eliquis Family Communication: Discussed all imaging results, lab results, explained to the patient    Disposition Plan:     Status is: Inpatient  The patient will require care spanning > 2 midnights and should be moved to inpatient because: Inpatient level of care appropriate due to severity of illness  Dispo: The patient is from: Home  Anticipated d/c is to: Home              Anticipated d/c date is: 2 days              Patient currently is not medically stable to d/c.      Time Spent in minutes   33mins    Procedures:  2D echo  Consultants:   None  Antimicrobials:   Anti-infectives (From admission, onward)   Start     Dose/Rate Route Frequency Ordered Stop   12/11/19 1400  amoxicillin (AMOXIL) capsule 500 mg     500 mg Oral Every 8 hours 12/11/19 1137     12/11/19 0015  cephALEXin (KEFLEX) capsule 500 mg  Status:  Discontinued     500 mg Oral 2 times daily 12/10/19 2321 12/11/19 1137         Medications  Scheduled Meds: . amLODipine  5 mg Oral Daily  . amoxicillin  500 mg Oral Q8H  . apixaban  2.5 mg Oral BID    . atenolol  50 mg Oral BID  . benzonatate  200 mg Oral TID  . dorzolamide  1 drop Left Eye BID  . furosemide  40 mg Intravenous Q12H  . irbesartan  150 mg Oral BID  . isosorbide mononitrate  30 mg Oral Daily  . latanoprost  1 drop Both Eyes QHS  . levothyroxine  100 mcg Oral Q0600  . loratadine  10 mg Oral Daily  . pantoprazole  40 mg Oral Daily  . potassium chloride  10 mEq Oral Daily  . potassium chloride  40 mEq Oral Once  . pramipexole  0.25 mg Oral BID  . sodium chloride flush  3 mL Intravenous Q12H  . temazepam  15 mg Oral QHS   Continuous Infusions: . sodium chloride     PRN Meds:.sodium chloride, acetaminophen, chlorpheniramine-HYDROcodone, melatonin, menthol-cetylpyridinium, ondansetron (ZOFRAN) IV, sodium chloride flush      Subjective:   Kristen Whitehead was seen and examined today.  Feeling lousy today, difficult to obtain review of system from the patient, poor historian.  States did not have leg cramps last night however had during the day yesterday.  Voice improving however coughing makes it worse.  Patient denies dizziness, abdominal pain, N/V/D/C, new weakness, numbess, tingling. No acute events overnight.    Objective:   Vitals:   12/11/19 2119 12/12/19 0456 12/12/19 1115 12/12/19 1130  BP: 124/63 132/61 (!) 102/56   Pulse: 66 76 72   Resp:  16    Temp:  97.7 F (36.5 C)    TempSrc:      SpO2:  90% (!) 84% 93%  Weight:  58.2 kg    Height:        Intake/Output Summary (Last 24 hours) at 12/12/2019 1314 Last data filed at 12/12/2019 0900 Gross per 24 hour  Intake 100 ml  Output 1350 ml  Net -1250 ml     Wt Readings from Last 3 Encounters:  12/12/19 58.2 kg  10/18/19 59.9 kg  08/18/19 59.7 kg    Physical Exam  General: Alert and oriented x 3, NAD coughing during the encounter  Cardiovascular: S1 S2 clear, RRR. No pedal edema b/l  Respiratory: Decreased breath sounds at bases, improving  Gastrointestinal: Soft, nontender, nondistended,  NBS  Ext: + pedal edema bilaterally  Neuro: no new deficits  Musculoskeletal: No cyanosis, clubbing  Skin: No rashes  Psych: Normal affect and demeanor, alert and oriented x3    Data Reviewed:  I have personally reviewed following labs and imaging studies  Micro Results Recent Results (from the past 240 hour(s))  Respiratory Panel by RT PCR (Flu A&B, Covid) - Nasopharyngeal Swab     Status: None   Collection Time: 12/10/19  7:23 PM   Specimen: Nasopharyngeal Swab  Result Value Ref Range Status   SARS Coronavirus 2 by RT PCR NEGATIVE NEGATIVE Final    Comment: (NOTE) SARS-CoV-2 target nucleic acids are NOT DETECTED. The SARS-CoV-2 RNA is generally detectable in upper respiratoy specimens during the acute phase of infection. The lowest concentration of SARS-CoV-2 viral copies this assay can detect is 131 copies/mL. A negative result does not preclude SARS-Cov-2 infection and should not be used as the sole basis for treatment or other patient management decisions. A negative result may occur with  improper specimen collection/handling, submission of specimen other than nasopharyngeal swab, presence of viral mutation(s) within the areas targeted by this assay, and inadequate number of viral copies (<131 copies/mL). A negative result must be combined with clinical observations, patient history, and epidemiological information. The expected result is Negative. Fact Sheet for Patients:  PinkCheek.be Fact Sheet for Healthcare Providers:  GravelBags.it This test is not yet ap proved or cleared by the [place] FDA and  has been authorized for detection and/or diagnosis of SARS-CoV-2 by FDA under an Emergency Use Authorization (EUA). This EUA will remain  in effect (meaning this test can be used) for the duration of the COVID-19 declaration under Section 564(b)(1) of the Act, 21 U.S.C. section 360bbb-3(b)(1), unless the  authorization is terminated or revoked sooner.    Influenza A by PCR NEGATIVE NEGATIVE Final   Influenza B by PCR NEGATIVE NEGATIVE Final    Comment: (NOTE) The Xpert Xpress SARS-CoV-2/FLU/RSV assay is intended as an aid in  the diagnosis of influenza from Nasopharyngeal swab specimens and  should not be used as a sole basis for treatment. Nasal washings and  aspirates are unacceptable for Xpert Xpress SARS-CoV-2/FLU/RSV  testing. Fact Sheet for Patients: PinkCheek.be Fact Sheet for Healthcare Providers: GravelBags.it This test is not yet approved or cleared by the [place] FDA and  has been authorized for detection and/or diagnosis of SARS-CoV-2 by  FDA under an Emergency Use Authorization (EUA). This EUA will remain  in effect (meaning this test can be used) for the duration of the  Covid-19 declaration under Section 564(b)(1) of the Act, 21  U.S.C. section 360bbb-3(b)(1), unless the authorization is  terminated or revoked. Performed at [facility], [place] [address] [place], [place] [address]   Urine Culture     Status: Abnormal (Preliminary result)   Collection Time: 12/11/19 10:59 AM   Specimen: Urine, Random  Result Value Ref Range Status   Specimen Description   Final    URINE, RANDOM Performed at [place] [address], [place], [place] [address]    Special Requests   Final    NONE Performed at [facility], [place] [address] [place], [place] [address]    Culture (A)  Final    20,000 COLONIES/mL GRAM NEGATIVE RODS SUSCEPTIBILITIES TO FOLLOW Performed at [facility], [place] [address], [place], [place] [address]    Report Status PENDING  Incomplete    Radiology Reports DG Chest 2 View  Addendum Date: 12/10/2019   ADDENDUM REPORT: 12/10/2019 18:53 ADDENDUM: More nodular opacity in the right hilum, favor overlapping vessels though  adenopathy or a pulmonary nodule cannot be fully excluded. Could  consider further evaluation cross-sectional imaging. Notification of the addendum submission with skull by telephone on 12/10/2019 at 6:52 pm to the nurse of provider [facility] , who verbally acknowledged these results. Electronically Signed   By: Lovena Le M.D.   On: 12/10/2019 18:53   Result Date: 12/10/2019 CLINICAL DATA:  Cough EXAM: CHEST - 2 VIEW COMPARISON:  Radiograph 12/09/2019, CT 06/11/2017 FINDINGS: Mild airways thickening with some increasing hazy and interstitial opacities in the lungs. More focal opacity in right hilum may reflect overlapping vessels though is overall indeterminate. Nodules seen on comparison CT from 2018 are not well visualized on this radiographic examination. Stable mild cardiomegaly. The aorta is calcified. The remaining cardiomediastinal contours are unremarkable. No acute osseous or soft tissue abnormality. Degenerative changes are present in the imaged spine and shoulders. Surgical clips at the base of the neck may reflect prior thyroidectomy. Telemetry leads overlie the chest. IMPRESSION: Mild airways thickening with some increasing hazy and interstitial opacities in the lungs. Findings may reflect bronchitis, edema or atypical infection. Electronically Signed: By: Lovena Le M.D. On: 12/10/2019 18:34   DG Chest Port 1 View  Result Date: 12/09/2019 CLINICAL DATA:  Generalized weakness for 2 days, coughing, rhinorrhea EXAM: PORTABLE CHEST 1 VIEW COMPARISON:  10/10/2018 FINDINGS: Single frontal view of the chest demonstrates an enlarged cardiac silhouette. No acute airspace disease, effusion, or pneumothorax. Chronic background scarring. IMPRESSION: 1. Stable exam, no acute process. Electronically Signed   By: Randa Ngo M.D.   On: 12/09/2019 19:04   ECHOCARDIOGRAM COMPLETE  Result Date: 12/11/2019    ECHOCARDIOGRAM REPORT   Patient Name:   Kristen Whitehead Date of Exam: 12/11/2019 Medical  Rec #:  [id number]           Height:       59.0 in Accession #:    [id number]          Weight:       124.0 lb Date of Birth:  1923/04/23           BSA:          1.505 m Patient Age:    89 years            BP:           111/47 mmHg Patient Gender: F                   HR:           74 bpm. Exam Location:  Inpatient Procedure: 2D Echo Indications:    428.31 CHF  History:        Patient has prior history of Echocardiogram examinations, most                 recent 01/07/2018. CHF; Risk Factors:Hypertension and                 Dyslipidemia. MVP. cancer.  Sonographer:    Jannett Celestine RDCS (AE) Referring Phys: [id number] Brailyn Killion K Rolinda Impson IMPRESSIONS  1. Left ventricular ejection fraction, by estimation, is 70 to 75%. The left ventricle has hyperdynamic function. The left ventricle has no regional wall motion abnormalities. Left ventricular diastolic parameters are indeterminate.  2. Right ventricular systolic function is normal. The right ventricular size is normal.  3. Left atrial size was mildly dilated.  4. Right atrial size was mildly dilated.  5. The mitral valve is normal in structure. Trivial mitral valve regurgitation. No evidence of mitral stenosis.  6. The aortic valve  is tricuspid. Aortic valve regurgitation is trivial. Mild to moderate aortic valve sclerosis/calcification is present, without any evidence of aortic stenosis.  7. The inferior vena cava is dilated in size with <50% respiratory variability, suggesting right atrial pressure of 15 mmHg. Comparison(s): No significant change from prior study. Prior images reviewed side by side. FINDINGS  Left Ventricle: Left ventricular ejection fraction, by estimation, is 70 to 75%. The left ventricle has hyperdynamic function. The left ventricle has no regional wall motion abnormalities. The left ventricular internal cavity size was normal in size. There is no left ventricular hypertrophy. Left ventricular diastolic parameters are indeterminate. Right Ventricle: The right  ventricular size is normal. No increase in right ventricular wall thickness. Right ventricular systolic function is normal. Left Atrium: Left atrial size was mildly dilated. Right Atrium: Right atrial size was mildly dilated. Pericardium: There is no evidence of pericardial effusion. Mitral Valve: The mitral valve is normal in structure. Normal mobility of the mitral valve leaflets. Severe mitral annular calcification. Trivial mitral valve regurgitation. No evidence of mitral valve stenosis. Tricuspid Valve: The tricuspid valve is normal in structure. Tricuspid valve regurgitation is trivial. No evidence of tricuspid stenosis. Aortic Valve: The aortic valve is tricuspid. Aortic valve regurgitation is trivial. Mild to moderate aortic valve sclerosis/calcification is present, without any evidence of aortic stenosis. Pulmonic Valve: The pulmonic valve was normal in structure. Pulmonic valve regurgitation is not visualized. No evidence of pulmonic stenosis. Aorta: The aortic root is normal in size and structure. Venous: The inferior vena cava is dilated in size with less than 50% respiratory variability, suggesting right atrial pressure of 15 mmHg. IAS/Shunts: No atrial level shunt detected by color flow Doppler.  LEFT VENTRICLE PLAX 2D LVIDd:         3.70 cm LVIDs:         2.10 cm LV PW:         1.00 cm LV IVS:        1.00 cm LVOT diam:     2.00 cm LV SV:         47 LV SV Index:   31 LVOT Area:     3.14 cm  LEFT ATRIUM             Index       RIGHT ATRIUM           Index LA diam:        4.10 cm 2.72 cm/m  RA Area:     18.20 cm LA Vol (A2C):   53.3 ml 35.41 ml/m RA Volume:   41.90 ml  27.84 ml/m LA Vol (A4C):   25.0 ml 16.61 ml/m LA Biplane Vol: 36.4 ml 24.18 ml/m  AORTIC VALVE LVOT Vmax:   73.80 cm/s LVOT Vmean:  51.900 cm/s LVOT VTI:    0.149 m  AORTA Ao Root diam: 2.80 cm MITRAL VALVE MV Area (PHT): 3.99 cm     SHUNTS MV Decel Time: 190 msec     Systemic VTI:  0.15 m MV E velocity: 102.00 cm/s  Systemic Diam:  2.00 cm Candee Furbish MD Electronically signed by Candee Furbish MD Signature Date/Time: 12/11/2019/2:23:27 PM    Final     Lab Data:  CBC: Recent Labs  Lab 12/09/19 1907 12/10/19 1754  WBC 13.0* 9.6  NEUTROABS 9.9* 6.5  HGB 13.9 12.2  HCT 42.2 36.2  MCV 94.6 94.8  PLT 209 [id number]   Basic Metabolic Panel: Recent Labs  Lab 12/09/19 1907 12/10/19 1754 12/11/19 0403  12/12/19 0439  NA 134* 133* 138 137  K 4.1 3.4* 3.7 3.3*  CL 95* 95* 100 98  CO2 29 29 27 28   GLUCOSE 107* 101* 101* 101*  BUN 11 14 13 14   CREATININE 0.69 0.68 0.67 0.71  CALCIUM 8.0* 7.6* 7.5* 7.2*  MG 2.4  --  2.0  --    GFR: Estimated Creatinine Clearance: 32.7 mL/min (by C-G formula based on SCr of 0.71 mg/dL). Liver Function Tests: Recent Labs  Lab 12/09/19 1907  AST 34  ALT 28  ALKPHOS 134*  BILITOT 1.8*  PROT 7.5  ALBUMIN 4.1   No results for input(s): LIPASE, AMYLASE in the last 168 hours. No results for input(s): AMMONIA in the last 168 hours. Coagulation Profile: No results for input(s): INR, PROTIME in the last 168 hours. Cardiac Enzymes: No results for input(s): CKTOTAL, CKMB, CKMBINDEX, TROPONINI in the last 168 hours. BNP (last 3 results) Recent Labs    01/05/19 1216  PROBNP 2,323*   HbA1C: No results for input(s): HGBA1C in the last 72 hours. CBG: No results for input(s): GLUCAP in the last 168 hours. Lipid Profile: No results for input(s): CHOL, HDL, LDLCALC, TRIG, CHOLHDL, LDLDIRECT in the last 72 hours. Thyroid Function Tests: No results for input(s): TSH, T4TOTAL, FREET4, T3FREE, THYROIDAB in the last 72 hours. Anemia Panel: No results for input(s): VITAMINB12, FOLATE, FERRITIN, TIBC, IRON, RETICCTPCT in the last 72 hours. Urine analysis:    Component Value Date/Time   COLORURINE STRAW (A) 12/09/2019 1907   APPEARANCEUR CLEAR 12/09/2019 1907   LABSPEC 1.002 (L) 12/09/2019 1907   PHURINE 8.0 12/09/2019 1907   GLUCOSEU NEGATIVE 12/09/2019 1907   GLUCOSEU NEGATIVE 06/01/2012  1228   HGBUR MODERATE (A) 12/09/2019 1907   BILIRUBINUR NEGATIVE 12/09/2019 1907   BILIRUBINUR neg 03/21/2013 [place] 12/09/2019 1907   PROTEINUR NEGATIVE 12/09/2019 1907   UROBILINOGEN 0.2 11/10/2014 2144   NITRITE NEGATIVE 12/09/2019 1907   LEUKOCYTESUR LARGE (A) 12/09/2019 1907     Thoren Hosang M.D. [facility] 12/12/2019, 1:14 PM   Call night coverage person covering after 7pm

## 2019-12-12 NOTE — Progress Notes (Signed)
Patient had an episode of nausea and vomiting "out of the blue". Was sitting in chair and ambulated to bathroom for small BM. Assisted back to bed and prn given. Pt has tolerated being on room air all morning, but O2 sat now is 84% on room air. Oxygen initiated again at 2 liters and sat returned to 93-95%. Resting quietly at present with no further nausea or vomiting. Will monitor. Eulas Post, RN

## 2019-12-12 NOTE — Evaluation (Signed)
Physical Therapy Evaluation Patient Details Name: Kristen Whitehead MRN: [id number] DOB: 1922/10/23 Today's Date: 12/12/2019   History of Present Illness  86 yo female admitted with acute respiratory failure, UTI, CHF. Hx of A fib, bladder prolapse  Clinical Impression  On eval, pt was Min guard assist for mobility. She walked ~135 feet with use of a RW. Pt tolerated activity well. O2: 92% on RA at rest, 89% on RA with ambulation. Discussed d/c plan-pt lives alone. She stated she prefers to return home. She would like some assistance arranging in home care. Will follow and progress activity as tolerated.     Follow Up Recommendations Home health PT;Supervision/Assistance - 24 hour vs SNF(pt prefers to return home. she stated she needs assistance arranging in home care)    Equipment Recommendations  None recommended by PT    Recommendations for Other Services       Precautions / Restrictions Precautions Precautions: Fall Restrictions Weight Bearing Restrictions: No      Mobility  Bed Mobility Overal bed mobility: Modified Independent Bed Mobility: Supine to Sit     Supine to sit: Modified independent (Device/Increase time);HOB elevated        Transfers Overall transfer level: Needs assistance Equipment used: Rolling walker (2 wheeled) Transfers: Sit to/from Stand Sit to Stand: Min guard         General transfer comment: Min guard for safety. VCs hand placement  Ambulation/Gait Ambulation/Gait assistance: Min guard Gait Distance (Feet): 135 Feet Assistive device: Rolling walker (2 wheeled) Gait Pattern/deviations: Step-through pattern;Decreased stride length     General Gait Details: Min guard for safety. Fair gait speed. No LOB with RW use. O2 89% on RA.  Stairs            Wheelchair Mobility    Modified Rankin (Stroke Patients Only)       Balance Overall balance assessment: History of Falls;Needs assistance         Standing balance  support: Bilateral upper extremity supported Standing balance-Leahy Scale: Fair                               Pertinent Vitals/Pain      Home Living                        Prior Function                 Hand Dominance        Extremity/Trunk Assessment   Upper Extremity Assessment Upper Extremity Assessment: Defer to OT evaluation    Lower Extremity Assessment Lower Extremity Assessment: Generalized weakness    Cervical / Trunk Assessment Cervical / Trunk Assessment: Kyphotic  Communication      Cognition Arousal/Alertness: Awake/alert Behavior During Therapy: WFL for tasks assessed/performed Overall Cognitive Status: Within Functional Limits for tasks assessed                                        General Comments      Exercises     Assessment/Plan    PT Assessment Patient needs continued PT services  PT Problem List Decreased strength;Decreased mobility;Decreased activity tolerance;Decreased balance;Decreased knowledge of use of DME       PT Treatment Interventions DME instruction;Gait training;Therapeutic activities;Therapeutic exercise;Patient/family education;Balance training;Functional mobility training    PT Goals (Current goals  can be found in the Care Plan section)  Acute Rehab PT Goals Patient Stated Goal: home PT Goal Formulation: With patient Time For Goal Achievement: 12/26/19 Potential to Achieve Goals: Good    Frequency Min 3X/week   Barriers to discharge Decreased caregiver support      Co-evaluation               AM-PAC PT "6 Clicks" Mobility  Outcome Measure Help needed turning from your back to your side while in a flat bed without using bedrails?: None Help needed moving from lying on your back to sitting on the side of a flat bed without using bedrails?: None Help needed moving to and from a bed to a chair (including a wheelchair)?: A Little Help needed standing up from a  chair using your arms (e.g., wheelchair or bedside chair)?: A Little Help needed to walk in hospital room?: A Little Help needed climbing 3-5 steps with a railing? : A Little 6 Click Score: 20    End of Session Equipment Utilized During Treatment: Gait belt Activity Tolerance: Patient tolerated treatment well Patient left: in chair;with call bell/phone within reach;with chair alarm set   PT Visit Diagnosis: Muscle weakness (generalized) (M62.81);Unsteadiness on feet (R26.81);History of falling (Z91.81)    Time: [id number] PT Time Calculation (min) (ACUTE ONLY): 30 min   Charges:   PT Evaluation $PT Eval Low Complexity: 1 Low PT Treatments $Gait Training: 8-22 mins           Doreatha Massed, PT Acute Rehabilitation

## 2019-12-13 DIAGNOSIS — I5021 Acute systolic (congestive) heart failure: Secondary | ICD-10-CM

## 2019-12-13 LAB — URINE CULTURE: Culture: 20000 — AB

## 2019-12-13 LAB — BASIC METABOLIC PANEL
Anion gap: 9 (ref 5–15)
BUN: 15 mg/dL (ref 8–23)
CO2: 29 mmol/L (ref 22–32)
Calcium: 7.4 mg/dL — ABNORMAL LOW (ref 8.9–10.3)
Chloride: 98 mmol/L (ref 98–111)
Creatinine, Ser: 0.79 mg/dL (ref 0.44–1.00)
GFR calc Af Amer: >60 mL/min (ref >60)
GFR calc non Af Amer: >60 mL/min (ref >60)
Glucose, Bld: 92 mg/dL (ref 70–99)
Potassium: 4 mmol/L (ref 3.5–5.1)
Sodium: 136 mmol/L (ref 135–145)

## 2019-12-13 NOTE — Progress Notes (Signed)
[facility]                                                                              Patient Demographics  Kristen Whitehead, is a 86 y.o. female, DOB - [birth year], [id number]  Admit date - 12/10/2019   Admitting Physician Vianne Bulls, MD  Outpatient Primary MD for the patient is Leighton Ruff, MD  Outpatient specialists:   LOS - 2  days   Medical records reviewed and are as summarized below:    Chief Complaint  Patient presents with   Shortness of Breath   Sore throat       Brief summary   Patient is a 86 year old female with history of A. fib on Eliquis, CAD, hypertension, hypothyroidism presented to ED with shortness of breath and fatigue.  Patient was seen in ED few days ago and was started on Keflex for suspected UTI and was given a dose of IV Lasix.  Patient continued to feel short of breath, weak requiring an extra pillow for the last few nights to sleep without becoming too dyspneic.  Denied any fevers or chills but reported a mild cough.  In ED, O2 sats down to mid 80s on room air, tachypneic.  Chest x-ray showed mild hazy interstitial opacities bilaterally.  Covid and influenza PCR negative  Assessment & Plan    Principal Problem:   Acute respiratory failure with hypoxia (HCC) likely secondary to acute on chronic diastolic CHF -Presented with several days of progressive dyspnea, fatigue, hypoxia, O2 sats dropping to 70s with mild exertion.  Chest x-ray with pulmonary edema - shortness of breath is improving, sitting up in the chair today, continue Lasix 40 mg IV every 12 hours for 1 more day, will transition to oral Lasix in a.m.  -Negative balance of 1.5 L   -Continue strict I's and O's and daily weights.   2D echo showed EF of 70 to 75%, no regional wall motion abnormalities, diastolic parameters indeterminate -Will need home O2 evaluation prior to discharge.  Active Problems:   Atrial fibrillation (HCC) -CHA2DS2-VASc 6    -Continue Eliquis, beta-blocker  Acute laryngitis -Patient also reported laryngitis for the last 3 days, prior to admission, worse with cough Difficulty speaking due to throat pain.  Much better now. -Continue Claritin, Tessalon Perles, Cepacol, Tussionex -place on amoxicillin for acute laryngitis for total 5 days    Essential hypertension BP currently stable    Hypothyroidism -Continue Synthroid    Acute cystitis without hematuria Patient was placed on Keflex on 4/22, completed Keflex on 4/24, currently on amoxicillin Urine culture showed 20,000 colonies of Pseudomonas aeruginosa, currently no complaints of hematuria or dysuria  CAD No anginal symptoms Continue nitrates, beta-blocker, ER  Generalized debility, failure to thrive -PT OT evaluation, lives alone  Restless leg syndrome, cramps -Tolerating current dose of pramipexole. States did not have any cramps overnight but did have cramps during the day yesterday, changed pramipexole to 0.25 mg twice daily  Code Status: DNR DVT Prophylaxis: Eliquis Family Communication: Discussed all imaging results, lab results, explained to the patient    Disposition Plan:     Status is: Inpatient  The patient will require care spanning > 2 midnights and should be moved to inpatient because: Inpatient level of care appropriate due to severity of illness.  Feels that she is not at her baseline to go home today, wants to work with PT OT.  Lives alone does not want rehab.  Still on 2 L O2 via nasal cannula  Dispo: The patient is from: Home              Anticipated d/c is to: Home              Anticipated d/c date is: 2 days              Patient currently is not medically stable to d/c.      Time Spent in minutes   30mins    Procedures:  2D echo  Consultants:   None  Antimicrobials:   Anti-infectives (From admission, onward)   Start     Dose/Rate Route Frequency Ordered Stop   12/11/19 1400  amoxicillin (AMOXIL) capsule 500  mg     500 mg Oral Every 8 hours 12/11/19 1137     12/11/19 0015  cephALEXin (KEFLEX) capsule 500 mg  Status:  Discontinued     500 mg Oral 2 times daily 12/10/19 2321 12/11/19 1137         Medications  Scheduled Meds:  amLODipine  5 mg Oral Daily   amoxicillin  500 mg Oral Q8H   apixaban  2.5 mg Oral BID   atenolol  50 mg Oral BID   benzonatate  200 mg Oral TID   dorzolamide  1 drop Left Eye BID   furosemide  40 mg Intravenous Q12H   irbesartan  150 mg Oral BID   isosorbide mononitrate  30 mg Oral Daily   latanoprost  1 drop Both Eyes QHS   levothyroxine  100 mcg Oral Q0600   loratadine  10 mg Oral Daily   pantoprazole  40 mg Oral Daily   potassium chloride  10 mEq Oral Daily   pramipexole  0.25 mg Oral BID   sodium chloride  1 drop Right Eye TID   sodium chloride flush  3 mL Intravenous Q12H   temazepam  15 mg Oral QHS   Continuous Infusions:  sodium chloride     PRN Meds:.sodium chloride, acetaminophen, chlorpheniramine-HYDROcodone, melatonin, menthol-cetylpyridinium, ondansetron (ZOFRAN) IV, sodium chloride flush      Subjective:   Kristen Whitehead was seen and examined today.  Feeling better today, shortness of breath is improving, still does not feel she is at her baseline, leg cramps are improving.  Voice is improving, states not coughing as much.  Patient denies dizziness, abdominal pain, N/V/D/C, new weakness, numbess, tingling. No acute events overnight.    Objective:   Vitals:   12/12/19 2111 12/13/19 0434 12/13/19 0437 12/13/19 1234  BP: 107/74  110/73 103/61  Pulse: 64  65 72  Resp: 18  18 17   Temp: 98 F (36.7 C)  98.4 F (36.9 C) 98.4 F (36.9 C)  TempSrc:      SpO2: 96%  95% 97%  Weight:  56.9 kg    Height:        Intake/Output Summary (Last 24 hours) at 12/13/2019 1238 Last data filed at 12/12/2019 1600 Gross per 24 hour  Intake --  Output 100 ml  Net -100 ml     Wt Readings from Last 3 Encounters:  12/13/19  56.9 kg  10/18/19 59.9 kg  08/18/19 59.7  kg   Physical Exam  General: Alert and oriented x 3, NAD  Cardiovascular: S1 S2 clear, RRR. No pedal edema b/l  Respiratory: Diminished breath sound at the bases, improving  Gastrointestinal: Soft, nontender, nondistended, NBS  Ext trace pedal edema bilaterally  Neuro: no new deficits  Musculoskeletal: No cyanosis, clubbing  Skin: No rashes  Psych: Normal affect and demeanor, alert and oriented x3      Data Reviewed:  I have personally reviewed following labs and imaging studies  Micro Results Recent Results (from the past 240 hour(s))  Respiratory Panel by RT PCR (Flu A&B, Covid) - Nasopharyngeal Swab     Status: None   Collection Time: 12/10/19  7:23 PM   Specimen: Nasopharyngeal Swab  Result Value Ref Range Status   SARS Coronavirus 2 by RT PCR NEGATIVE NEGATIVE Final    Comment: (NOTE) SARS-CoV-2 target nucleic acids are NOT DETECTED. The SARS-CoV-2 RNA is generally detectable in upper respiratoy specimens during the acute phase of infection. The lowest concentration of SARS-CoV-2 viral copies this assay can detect is 131 copies/mL. A negative result does not preclude SARS-Cov-2 infection and should not be used as the sole basis for treatment or other patient management decisions. A negative result may occur with  improper specimen collection/handling, submission of specimen other than nasopharyngeal swab, presence of viral mutation(s) within the areas targeted by this assay, and inadequate number of viral copies (<131 copies/mL). A negative result must be combined with clinical observations, patient history, and epidemiological information. The expected result is Negative. Fact Sheet for Patients:  PinkCheek.be Fact Sheet for Healthcare Providers:  GravelBags.it This test is not yet ap proved or cleared by the [place] FDA and  has been authorized for  detection and/or diagnosis of SARS-CoV-2 by FDA under an Emergency Use Authorization (EUA). This EUA will remain  in effect (meaning this test can be used) for the duration of the COVID-19 declaration under Section 564(b)(1) of the Act, 21 U.S.C. section 360bbb-3(b)(1), unless the authorization is terminated or revoked sooner.    Influenza A by PCR NEGATIVE NEGATIVE Final   Influenza B by PCR NEGATIVE NEGATIVE Final    Comment: (NOTE) The Xpert Xpress SARS-CoV-2/FLU/RSV assay is intended as an aid in  the diagnosis of influenza from Nasopharyngeal swab specimens and  should not be used as a sole basis for treatment. Nasal washings and  aspirates are unacceptable for Xpert Xpress SARS-CoV-2/FLU/RSV  testing. Fact Sheet for Patients: PinkCheek.be Fact Sheet for Healthcare Providers: GravelBags.it This test is not yet approved or cleared by the [place] FDA and  has been authorized for detection and/or diagnosis of SARS-CoV-2 by  FDA under an Emergency Use Authorization (EUA). This EUA will remain  in effect (meaning this test can be used) for the duration of the  Covid-19 declaration under Section 564(b)(1) of the Act, 21  U.S.C. section 360bbb-3(b)(1), unless the authorization is  terminated or revoked. Performed at [facility], [place] [address] [place], [place] [address]   Urine Culture     Status: Abnormal   Collection Time: 12/11/19 10:59 AM   Specimen: Urine, Random  Result Value Ref Range Status   Specimen Description   Final    URINE, RANDOM Performed at [facility] [address] [place], [place] [address]    Special Requests   Final    NONE Performed at [facility], [place] [address], [place], [place] [address]    Culture 20,000 COLONIES/mL PSEUDOMONAS  AERUGINOSA (A)  Final   Report Status 12/13/2019 FINAL  Final   Organism ID, Bacteria PSEUDOMONAS  AERUGINOSA (A)  Final      Susceptibility   Pseudomonas aeruginosa - MIC*    CEFTAZIDIME 4 SENSITIVE Sensitive     CIPROFLOXACIN <=0.25 SENSITIVE Sensitive     GENTAMICIN <=1 SENSITIVE Sensitive     IMIPENEM 2 SENSITIVE Sensitive     PIP/TAZO 8 SENSITIVE Sensitive     CEFEPIME 2 SENSITIVE Sensitive     * 20,000 COLONIES/mL PSEUDOMONAS AERUGINOSA    Radiology Reports DG Chest 2 View  Addendum Date: 12/10/2019   ADDENDUM REPORT: 12/10/2019 18:53 ADDENDUM: More nodular opacity in the right hilum, favor overlapping vessels though adenopathy or a pulmonary nodule cannot be fully excluded. Could consider further evaluation cross-sectional imaging. Notification of the addendum submission with skull by telephone on 12/10/2019 at 6:52 pm to the nurse of provider [facility] , who verbally acknowledged these results. Electronically Signed   By: Lovena Le M.D.   On: 12/10/2019 18:53   Result Date: 12/10/2019 CLINICAL DATA:  Cough EXAM: CHEST - 2 VIEW COMPARISON:  Radiograph 12/09/2019, CT 06/11/2017 FINDINGS: Mild airways thickening with some increasing hazy and interstitial opacities in the lungs. More focal opacity in right hilum may reflect overlapping vessels though is overall indeterminate. Nodules seen on comparison CT from 2018 are not well visualized on this radiographic examination. Stable mild cardiomegaly. The aorta is calcified. The remaining cardiomediastinal contours are unremarkable. No acute osseous or soft tissue abnormality. Degenerative changes are present in the imaged spine and shoulders. Surgical clips at the base of the neck may reflect prior thyroidectomy. Telemetry leads overlie the chest. IMPRESSION: Mild airways thickening with some increasing hazy and interstitial opacities in the lungs. Findings may reflect bronchitis, edema or atypical infection. Electronically Signed: By: Lovena Le M.D. On: 12/10/2019 18:34   DG Chest Port 1 View  Result Date: 12/09/2019 CLINICAL  DATA:  Generalized weakness for 2 days, coughing, rhinorrhea EXAM: PORTABLE CHEST 1 VIEW COMPARISON:  10/10/2018 FINDINGS: Single frontal view of the chest demonstrates an enlarged cardiac silhouette. No acute airspace disease, effusion, or pneumothorax. Chronic background scarring. IMPRESSION: 1. Stable exam, no acute process. Electronically Signed   By: Randa Ngo M.D.   On: 12/09/2019 19:04   ECHOCARDIOGRAM COMPLETE  Result Date: 12/11/2019    ECHOCARDIOGRAM REPORT   Patient Name:   MACAULEY BICK Date of Exam: 12/11/2019 Medical Rec #:  [id number]           Height:       59.0 in Accession #:    [id number]          Weight:       124.0 lb Date of Birth:  [birth year]           BSA:          1.505 m Patient Age:    89 years            BP:           111/47 mmHg Patient Gender: F                   HR:           74 bpm. Exam Location:  Inpatient Procedure: 2D Echo Indications:    428.31 CHF  History:        Patient has prior history of Echocardiogram examinations, most  recent 01/07/2018. CHF; Risk Factors:Hypertension and                 Dyslipidemia. MVP. cancer.  Sonographer:    Jannett Celestine RDCS (AE) Referring Phys: [id number] Avia Merkley K Adalbert Alberto IMPRESSIONS  1. Left ventricular ejection fraction, by estimation, is 70 to 75%. The left ventricle has hyperdynamic function. The left ventricle has no regional wall motion abnormalities. Left ventricular diastolic parameters are indeterminate.  2. Right ventricular systolic function is normal. The right ventricular size is normal.  3. Left atrial size was mildly dilated.  4. Right atrial size was mildly dilated.  5. The mitral valve is normal in structure. Trivial mitral valve regurgitation. No evidence of mitral stenosis.  6. The aortic valve is tricuspid. Aortic valve regurgitation is trivial. Mild to moderate aortic valve sclerosis/calcification is present, without any evidence of aortic stenosis.  7. The inferior vena cava is dilated in size with <50%  respiratory variability, suggesting right atrial pressure of 15 mmHg. Comparison(s): No significant change from prior study. Prior images reviewed side by side. FINDINGS  Left Ventricle: Left ventricular ejection fraction, by estimation, is 70 to 75%. The left ventricle has hyperdynamic function. The left ventricle has no regional wall motion abnormalities. The left ventricular internal cavity size was normal in size. There is no left ventricular hypertrophy. Left ventricular diastolic parameters are indeterminate. Right Ventricle: The right ventricular size is normal. No increase in right ventricular wall thickness. Right ventricular systolic function is normal. Left Atrium: Left atrial size was mildly dilated. Right Atrium: Right atrial size was mildly dilated. Pericardium: There is no evidence of pericardial effusion. Mitral Valve: The mitral valve is normal in structure. Normal mobility of the mitral valve leaflets. Severe mitral annular calcification. Trivial mitral valve regurgitation. No evidence of mitral valve stenosis. Tricuspid Valve: The tricuspid valve is normal in structure. Tricuspid valve regurgitation is trivial. No evidence of tricuspid stenosis. Aortic Valve: The aortic valve is tricuspid. Aortic valve regurgitation is trivial. Mild to moderate aortic valve sclerosis/calcification is present, without any evidence of aortic stenosis. Pulmonic Valve: The pulmonic valve was normal in structure. Pulmonic valve regurgitation is not visualized. No evidence of pulmonic stenosis. Aorta: The aortic root is normal in size and structure. Venous: The inferior vena cava is dilated in size with less than 50% respiratory variability, suggesting right atrial pressure of 15 mmHg. IAS/Shunts: No atrial level shunt detected by color flow Doppler.  LEFT VENTRICLE PLAX 2D LVIDd:         3.70 cm LVIDs:         2.10 cm LV PW:         1.00 cm LV IVS:        1.00 cm LVOT diam:     2.00 cm LV SV:         47 LV SV Index:    31 LVOT Area:     3.14 cm  LEFT ATRIUM             Index       RIGHT ATRIUM           Index LA diam:        4.10 cm 2.72 cm/m  RA Area:     18.20 cm LA Vol (A2C):   53.3 ml 35.41 ml/m RA Volume:   41.90 ml  27.84 ml/m LA Vol (A4C):   25.0 ml 16.61 ml/m LA Biplane Vol: 36.4 ml 24.18 ml/m  AORTIC VALVE LVOT Vmax:   73.80 cm/s  LVOT Vmean:  51.900 cm/s LVOT VTI:    0.149 m  AORTA Ao Root diam: 2.80 cm MITRAL VALVE MV Area (PHT): 3.99 cm     SHUNTS MV Decel Time: 190 msec     Systemic VTI:  0.15 m MV E velocity: 102.00 cm/s  Systemic Diam: 2.00 cm Candee Furbish MD Electronically signed by Candee Furbish MD Signature Date/Time: 12/11/2019/2:23:27 PM    Final     Lab Data:  CBC: Recent Labs  Lab 12/09/19 1907 12/10/19 1754  WBC 13.0* 9.6  NEUTROABS 9.9* 6.5  HGB 13.9 12.2  HCT 42.2 36.2  MCV 94.6 94.8  PLT 209 [id number]   Basic Metabolic Panel: Recent Labs  Lab 12/09/19 1907 12/10/19 1754 12/11/19 0403 12/12/19 0439 12/13/19 0346  NA 134* 133* 138 137 136  K 4.1 3.4* 3.7 3.3* 4.0  CL 95* 95* 100 98 98  CO2 29 29 27 28 29   GLUCOSE 107* 101* 101* 101* 92  BUN 11 14 13 14 15   CREATININE 0.69 0.68 0.67 0.71 0.79  CALCIUM 8.0* 7.6* 7.5* 7.2* 7.4*  MG 2.4  --  2.0  --   --    GFR: Estimated Creatinine Clearance: 32.3 mL/min (by C-G formula based on SCr of 0.79 mg/dL). Liver Function Tests: Recent Labs  Lab 12/09/19 1907  AST 34  ALT 28  ALKPHOS 134*  BILITOT 1.8*  PROT 7.5  ALBUMIN 4.1   No results for input(s): LIPASE, AMYLASE in the last 168 hours. No results for input(s): AMMONIA in the last 168 hours. Coagulation Profile: No results for input(s): INR, PROTIME in the last 168 hours. Cardiac Enzymes: No results for input(s): CKTOTAL, CKMB, CKMBINDEX, TROPONINI in the last 168 hours. BNP (last 3 results) Recent Labs    01/05/19 1216  PROBNP 2,323*   HbA1C: No results for input(s): HGBA1C in the last 72 hours. CBG: No results for input(s): GLUCAP in the last 168  hours. Lipid Profile: No results for input(s): CHOL, HDL, LDLCALC, TRIG, CHOLHDL, LDLDIRECT in the last 72 hours. Thyroid Function Tests: No results for input(s): TSH, T4TOTAL, FREET4, T3FREE, THYROIDAB in the last 72 hours. Anemia Panel: No results for input(s): VITAMINB12, FOLATE, FERRITIN, TIBC, IRON, RETICCTPCT in the last 72 hours. Urine analysis:    Component Value Date/Time   COLORURINE STRAW (A) 12/09/2019 1907   APPEARANCEUR CLEAR 12/09/2019 1907   LABSPEC 1.002 (L) 12/09/2019 1907   PHURINE 8.0 12/09/2019 1907   GLUCOSEU NEGATIVE 12/09/2019 1907   GLUCOSEU NEGATIVE 06/01/2012 1228   HGBUR MODERATE (A) 12/09/2019 1907   BILIRUBINUR NEGATIVE 12/09/2019 1907   BILIRUBINUR neg 03/21/2013 [place] 12/09/2019 1907   PROTEINUR NEGATIVE 12/09/2019 1907   UROBILINOGEN 0.2 11/10/2014 2144   NITRITE NEGATIVE 12/09/2019 1907   LEUKOCYTESUR LARGE (A) 12/09/2019 1907     Senita Corredor M.D. [facility] 12/13/2019, 12:38 PM   Call night coverage person covering after 7pm

## 2019-12-13 NOTE — Progress Notes (Signed)
SATURATION QUALIFICATIONS: (This note is used to comply with regulatory documentation for home oxygen)  Patient Saturations on Room Air at Rest 95%  Patient Saturations on Room Air while Ambulating 85%  Patient Saturations on 2 Liters of oxygen while Ambulating 93%  Please briefly explain why patient needs home oxygen: Pt became tired after walking 180 feet.half hall.  SRP,  RN

## 2019-12-13 NOTE — Plan of Care (Signed)
  Problem: Education: Goal: Knowledge of General Education information will improve Description Including pain rating scale, medication(s)/side effects and non-pharmacologic comfort measures Outcome: Progressing   

## 2019-12-13 NOTE — Evaluation (Signed)
Occupational Therapy Evaluation Patient Details Name: Kristen Whitehead MRN: [id number] DOB: [birth year] Today's Date: 12/13/2019    History of Present Illness 86 yo female admitted with acute respiratory failure, UTI, CHF. Hx of A fib, bladder prolapse   Clinical Impression   Patient with functional deficits listed below. Patient overall supervision to min guard assist for functional transfers and mobility with walker. Patient reports needing a shower chair and oxygen for home, patient states she checks her vitals at home and is sometimes in the 70s "I didn't know that was low." Patient fluctuating between 86-92% on room air after using bathroom. Patient also reports sometimes making errors with her home medications using weekly pill box planners. Recommend continued acute OT services to maximize patient safety/independence with self care.   Patient may need additional [facility] services for higher level tasks such as medication management.    Follow Up Recommendations  Home health OT;Supervision/Assistance - 24 hour    Equipment Recommendations  Tub/shower seat;Other (comment)(toilet rails)       Precautions / Restrictions Precautions Precautions: Fall Restrictions Weight Bearing Restrictions: No      Mobility Bed Mobility Overal bed mobility: Modified Independent                Transfers Overall transfer level: Needs assistance Equipment used: Rolling walker (2 wheeled) Transfers: Sit to/from Stand Sit to Stand: Min guard         General transfer comment: Min guard for safety. VCs hand placement    Balance Overall balance assessment: History of Falls;Needs assistance Sitting-balance support: No upper extremity supported;Feet supported Sitting balance-Leahy Scale: Good     Standing balance support: Bilateral upper extremity supported;During functional activity Standing balance-Leahy Scale: Fair Standing balance comment: can support herself without however  increased safety with external support                           ADL either performed or assessed with clinical judgement   ADL Overall ADL's : Needs assistance/impaired     Grooming: Wash/dry hands;Wash/dry face;Supervision/safety;Min guard;Standing   Upper Body Bathing: Set up;Sitting   Lower Body Bathing: Supervison/ safety;Min guard;Sit to/from stand   Upper Body Dressing : Set up;Sitting   Lower Body Dressing: Supervision/safety;Min guard;Sit to/from stand;Sitting/lateral leans   Toilet Transfer: Min guard;Regular Toilet;Grab bars;RW;Ambulation Armed forces technical officer Details (indicate cue type and reason): patient reports she has nearly fallen a few times at home, but steadies herself holding the sink Toileting- Water quality scientist and Hygiene: Supervision/safety;Min guard;Sitting/lateral lean;Sit to/from stand       Functional mobility during ADLs: Min guard;Rolling walker       Vision Baseline Vision/History: Wears glasses              Pertinent Vitals/Pain Pain Assessment: Faces Faces Pain Scale: No hurt     Hand Dominance Right   Extremity/Trunk Assessment Upper Extremity Assessment Upper Extremity Assessment: Generalized weakness   Lower Extremity Assessment Lower Extremity Assessment: Defer to PT evaluation   Cervical / Trunk Assessment Cervical / Trunk Assessment: Kyphotic   Communication Communication Communication: No difficulties   Cognition Arousal/Alertness: Awake/alert Behavior During Therapy: WFL for tasks assessed/performed Overall Cognitive Status: No family/caregiver present to determine baseline cognitive functioning Area of Impairment: Safety/judgement                         Safety/Judgement: Decreased awareness of safety     General Comments: patient reports she sometimes makes  mistakes with her home medications "If I see it I just don't take that pill"    General Comments  pt O2 fluctuate between 86-92% on  room air after using bathroom            Home Living Family/patient expects to be discharged to:: Private residence Living Arrangements: Alone   Type of Home: House Home Access: Stairs to enter [employer] of Steps: 1   Home Layout: One level     Bathroom Shower/Tub: Walk-in shower;Other (comment)(drop in shower, uses stool to get in/out)   Bathroom Toilet: Standard     Home Equipment: Walker - 4 wheels          Prior Functioning/Environment Level of Independence: Independent        Comments: still drives        OT Problem List: Decreased activity tolerance;Impaired balance (sitting and/or standing);Decreased safety awareness;Decreased cognition      OT Treatment/Interventions: Self-care/ADL training;Therapeutic exercise;Energy conservation;DME and/or AE instruction;Therapeutic activities;Cognitive remediation/compensation;Patient/family education;Balance training    OT Goals(Current goals can be found in the care plan section) Acute Rehab OT Goals Patient Stated Goal: home OT Goal Formulation: With patient Time For Goal Achievement: 12/27/19 Potential to Achieve Goals: Good  OT Frequency: Min 2X/week    AM-PAC OT "6 Clicks" Daily Activity     Outcome Measure Help from another person eating meals?: None Help from another person taking care of personal grooming?: A Little Help from another person toileting, which includes using toliet, bedpan, or urinal?: A Little Help from another person bathing (including washing, rinsing, drying)?: A Little Help from another person to put on and taking off regular upper body clothing?: A Little Help from another person to put on and taking off regular lower body clothing?: A Little 6 Click Score: 19   End of Session Equipment Utilized During Treatment: Rolling walker;Oxygen  Activity Tolerance: Patient tolerated treatment well Patient left: in chair;with call bell/phone within reach;with chair alarm set  OT  Visit Diagnosis: Other abnormalities of gait and mobility (R26.89);History of falling (Z91.81)                Time: [id number] OT Time Calculation (min): 23 min Charges:  OT General Charges $OT Visit: 1 Visit OT Evaluation $OT Eval Moderate Complexity: 1 Mod OT Treatments $Self Care/Home Management : 8-22 mins  Delbert Phenix OT Pager: [place] 12/13/2019, 12:42 PM

## 2019-12-13 NOTE — Progress Notes (Addendum)
Physical Therapy Treatment Patient Details Name: Kristen Whitehead MRN: [id number] DOB: Jun 23, 1923 Today's Date: 12/13/2019    History of Present Illness 86 yo female admitted with acute respiratory failure, UTI, CHF. Hx of A fib, bladder prolapse    PT Comments    Pt participated fairly well. She exhibits general weakness and decreased activity tolerance. O2: 92% on RA at rest, 89% on RA with ambulation during session however, per RN, O2 sat readings seem to fluctuate quite a bit throughout the day. She continues to request CM assistance with arranging in home care.     Follow Up Recommendations  Home health PT;Supervision/Assistance - 24 hour(pt declines placement)     Equipment Recommendations  None recommended by PT    Recommendations for Other Services       Precautions / Restrictions Precautions Precautions: Fall Restrictions Weight Bearing Restrictions: No    Mobility  Bed Mobility Overal bed mobility: Modified Independent Bed Mobility: Supine to Sit              Transfers Overall transfer level: Needs assistance Equipment used: Rolling walker (2 wheeled) Transfers: Sit to/from Stand Sit to Stand: Min guard         General transfer comment: Min guard for safety. VCs hand placement  Ambulation/Gait Ambulation/Gait assistance: Min guard Gait Distance (Feet): 200 Feet Assistive device: Rolling walker (2 wheeled) Gait Pattern/deviations: Step-through pattern;Decreased stride length     General Gait Details: Min guard for safety. Fair gait speed. No LOB with RW use. O2 89% on RA.   Stairs             Wheelchair Mobility    Modified Rankin (Stroke Patients Only)       Balance Overall balance assessment: History of Falls;Needs assistance Sitting-balance support: No upper extremity supported;Feet supported Sitting balance-Leahy Scale: Good     Standing balance support: Bilateral upper extremity supported Standing balance-Leahy Scale:  Fair Standing balance comment: can support herself without however increased safety with external support                            Cognition Arousal/Alertness: Awake/alert Behavior During Therapy: WFL for tasks assessed/performed Overall Cognitive Status: Within Functional Limits for tasks assessed Area of Impairment: Safety/judgement                         Safety/Judgement: Decreased awareness of safety     General Comments: patient reports she sometimes makes mistakes with her home medications "If I see it I just don't take that pill"       Exercises      General Comments General comments (skin integrity, edema, etc.): pt O2 fluctuate between 86-92% on room air after using bathroom      Pertinent Vitals/Pain Pain Assessment: No/denies pain Faces Pain Scale: No hurt    Home Living Family/patient expects to be discharged to:: Private residence Living Arrangements: Alone   Type of Home: House Home Access: Stairs to enter   Home Layout: One level Home Equipment: Environmental consultant - 4 wheels      Prior Function Level of Independence: Independent      Comments: still drives   PT Goals (current goals can now be found in the care plan section) Acute Rehab PT Goals Patient Stated Goal: home Progress towards PT goals: Progressing toward goals    Frequency    Min 3X/week      PT Plan Current  plan remains appropriate    Co-evaluation              AM-PAC PT "6 Clicks" Mobility   Outcome Measure  Help needed turning from your back to your side while in a flat bed without using bedrails?: None Help needed moving from lying on your back to sitting on the side of a flat bed without using bedrails?: None Help needed moving to and from a bed to a chair (including a wheelchair)?: A Little Help needed standing up from a chair using your arms (e.g., wheelchair or bedside chair)?: A Little Help needed to walk in hospital room?: A Little Help needed  climbing 3-5 steps with a railing? : A Little 6 Click Score: 20    End of Session Equipment Utilized During Treatment: Gait belt Activity Tolerance: Patient tolerated treatment well Patient left: in bed;with call bell/phone within reach;with nursing/sitter in room   PT Visit Diagnosis: Muscle weakness (generalized) (M62.81);Unsteadiness on feet (R26.81);History of falling (Z91.81)     Time: [id number] PT Time Calculation (min) (ACUTE ONLY): 18 min  Charges:  $Gait Training: 8-22 mins                         Doreatha Massed, PT Acute Rehabilitation

## 2019-12-13 NOTE — Progress Notes (Deleted)
Cardiology Clinic Note   Patient Name: Kristen Whitehead Date of Encounter: 12/13/2019  Primary Care Provider:  Leighton Ruff, Whitehead Primary Cardiologist:  Kristen Whitehead  Patient Kristen Whitehead 85 year old female presents today for follow-up evaluation of her atrial fibrillation, hypertension, mitral regurgitation, and acute on chronic diastolic CHF.  Past Medical History    Past Medical History:  Diagnosis Date  . Bladder prolapse, female, acquired   . Cancer ([place])    thyroid  . Chest pain    no known ischemic heart disease; negative Myoview July 2013. EF 75% with no ischemia.   . Chronic anticoagulation   . Chronic atrial fibrillation ([facility])    managed with rate control and coumadin  . Chronic diastolic heart failure ([place])   . GERD (gastroesophageal reflux disease)   . Heart disease   . History of congenital mitral regurgitation    mild  . History of mitral valve prolapse 06/15/2008   a. echo 1/14: mild LVH, EF 60-65%, mild MR, mild to mod BAE, PASP 35  . Hypercholesterolemia   . Hypertension   . Hypothyroidism    Past Surgical History:  Procedure Laterality Date  . ABDOMINAL HYSTERECTOMY    . CATARACT EXTRACTION    . CHOLECYSTECTOMY    . Thyroidectomy      Allergies  Allergies  Allergen Reactions  . Azithromycin Nausea Only  . Compazine [Prochlorperazine Edisylate] Nausea Only  . Demerol Nausea Only  . Dolophine [Methadone] Nausea Only  . Ebastine Nausea Only    EBS  . Erythromycin Nausea Only  . Ibuprofen Nausea Only  . Statins Other (See Comments)    myalgias  . Tetanus Toxoids Nausea Only  . Tetracyclines & Related Nausea Only    History of Present Illness    Kristen Whitehead has a past medical history of chronic atrial fibrillation, hypertension, HLD, mild MR and AR, and chronic diastolic heart failure.  She is previously followed by Kristen Whitehead.  She last saw him 1/17 for follow-up of frequent episodes of chest pain.   She was referred for stress testing but declined due to not tolerating Lexiscan well in the past.  Her troponins were evaluated and were negative.  This is been a chronic problem so further evaluation was deferred.  A stress test in 2013 was negative for ischemia.  She has been known to report "heart attacks" that occur when she overexerts herself.  When these events happen she describes pain across her chest that is associated with shortness of breath.  She previously watched a documentary on broken heart syndrome thanks this must be what is happened to her.  When she gets pain she prays to God  to take it away  and eventually pain subsides.  She does not have associated nausea or diaphoresis.  Her Imdur has been increased to 90 mg daily (3/17).  Since that time she has had an episode of heart failure 6/17 that responded to increased diuresis.  She has known chronic exertional dyspnea.  She was last seen by Kristen Whitehead on 10/18/2019.  During that time she indicated that she had not been as active during COVID-19 pandemic and had seen a decrease in her strength over the previous year.  She also indicated that she had fallen several months ago.  Physical therapy was prescribed but she felt she had not fully regained her strength.  She was using a cane for balance.  She indicated that she was not getting  as much exercise as she used to.  She was walking better prior to her fall and did not feel that she had recovered fully.  She denied chest pain other than GERD symptoms.  This felt better with ranitidine.  She indicated that she was not sleeping well and had difficulty falling asleep.  She was having issues with frequent urination and did not want to increase her furosemide.  She continued to have some mild lower extremity edema that did improve with elevating her lower extremities.  She was noted to have 1 pillow orthopnea.  Recently admitted to the hospital on 12/10/2019 with shortness of breath and fatigue.  She  was afebrile, saturating in the mid 80s on room air, tachypneic, and blood pressure was stable.  CXR showed mild airway thickening and hazy interstitial opacities bilaterally.  BNP elevated at 511.  Covid and influenza PCR negative.  She presents the clinic today for follow-up evaluation and states***  *** denies chest pain, shortness of breath, lower extremity edema, fatigue, palpitations, melena, hematuria, hemoptysis, diaphoresis, weakness, presyncope, syncope, orthopnea, and PND.   Home Medications    Prior to Admission medications   Medication Sig Start Date End Date Taking? Authorizing Provider  amLODipine (NORVASC) 2.5 MG tablet Take 2 tablets (5 mg total) by mouth daily. 11/09/19   Kristen Whitehead  apixaban (ELIQUIS) 2.5 MG TABS tablet Take 1 tablet (2.5 mg total) by mouth 2 (two) times daily. 12/08/19   Kristen Whitehead  atenolol (TENORMIN) 50 MG tablet Take 1 tablet (50 mg total) by mouth 2 (two) times daily. 11/16/19 02/14/20  Kristen Whitehead  benzonatate (TESSALON) 100 MG capsule Take 1 capsule (100 mg total) by mouth every 8 (eight) hours. 12/10/19   Kristen Biles, Whitehead  Calcium Carbonate-Vitamin D (CALCIUM 600+D PO) Take 1 tablet by mouth 2 (two) times daily.    Provider, Historical, Whitehead  cephALEXin (KEFLEX) 500 MG capsule Take 1 capsule (500 mg total) by mouth 2 (two) times daily for 5 days. 12/09/19 12/14/19  Kristen Muskrat, Whitehead  Cholecalciferol (VITAMIN D) 2000 UNITS tablet Take 4,000 Units by mouth daily.     Provider, Historical, Whitehead  dorzolamide (TRUSOPT) 2 % ophthalmic solution Place 1 drop into the left eye 2 (two) times daily. 09/24/19   Provider, Historical, Whitehead  furosemide (LASIX) 40 MG tablet Take 0.5-1 tablets (20-40 mg total) by mouth 2 (two) times daily. 40mg   in the morning, 20mg  in afternoon. OK TO USE EXTRA 20 MG IN AFTERNOON IF NEEDED 12/08/19   Kristen Whitehead  irbesartan (AVAPRO) 300 MG tablet Take 0.5 tablets (150 mg total) by mouth 2 (two) times  daily. 11/16/19   Kristen Whitehead  isosorbide mononitrate (IMDUR) 30 MG 24 hr tablet Take 1 tablet (30 mg total) by mouth daily. 12/08/19   Kristen Whitehead  latanoprost (XALATAN) 0.005 % ophthalmic solution Place 1 drop into both eyes at bedtime.  02/23/13   Provider, Historical, Whitehead  levothyroxine (SYNTHROID, LEVOTHROID) 100 MCG tablet Take 100 mcg by mouth daily.  04/20/15   Provider, Historical, Whitehead  MAGNESIUM OXIDE 400 PO Take 800 mg by mouth daily.    Provider, Historical, Whitehead  Multiple Vitamins-Minerals (CENTRUM SILVER PO) Take 1 tablet by mouth daily.    Provider, Historical, Whitehead  Multiple Vitamins-Minerals (PRESERVISION AREDS 2 PO) Take 1 capsule by mouth 2 (two) times daily.     Provider, Historical, Whitehead  nitroGLYCERIN (NITROSTAT) 0.4 MG SL tablet DISSOLVE 1 TABLET UNDER THE TONGUE EVERY  5 MINUTES FOR 3 DOSES AS NEEDED  FOR  CHEST  PAIN 12/23/18   Kristen Whitehead  omeprazole (PRILOSEC) 40 MG capsule Take 1 capsule (40 mg total) by mouth daily. 12/08/19   Kristen Whitehead  potassium chloride (KLOR-CON) 10 MEQ tablet Take 1 tablet (10 mEq total) by mouth daily. 12/08/19   Kristen Whitehead  pramipexole (MIRAPEX) 0.125 MG tablet Take 1 tablet (0.125 mg total) by mouth every evening. 11/29/19   Star Age, Whitehead  predniSONE (DELTASONE) 10 MG tablet Take 5 tablets (50 mg total) by mouth daily. 12/10/19   Kristen Biles, Whitehead  PREMARIN vaginal cream Place 2 g vaginally once a week. Uses on Sunday and wednesday 03/25/18   Provider, Historical, Whitehead  Probiotic Product (PROBIOTIC DAILY PO) Take 1 tablet by mouth daily.    Provider, Historical, Whitehead  sodium chloride (MURO 128) 5 % ophthalmic solution Place 1 drop into the right eye 3 (three) times daily.    Provider, Historical, Whitehead  temazepam (RESTORIL) 15 MG capsule Take 15 mg by mouth at bedtime.    Provider, Historical, Whitehead    Family History    Family History  Problem Relation Age of Onset  . Stroke Mother   . Stroke Father   . Heart  attack Neg Hx   . Heart disease Neg Hx    She indicated that her mother is deceased. She indicated that her father is deceased. She indicated that her maternal grandmother is deceased. She indicated that her maternal grandfather is deceased. She indicated that her paternal grandmother is deceased. She indicated that her paternal grandfather is deceased. She indicated that the status of her neg hx is unknown.  Social History    Social History   Socioeconomic History  . Marital status: Widowed    Spouse name: Not on file  . Number of children: 3  . Years of education: BA  . Highest education level: Not on file  Occupational History    Employer: RETIRED  Tobacco Use  . Smoking status: Never Smoker  . Smokeless tobacco: Never Used  Substance and Sexual Activity  . Alcohol use: No  . Drug use: No  . Sexual activity: Never  Other Topics Concern  . Not on file  Social History Narrative   Pt lives at home alone.   Caffeine Use: quit 55yrs ago   Social Determinants of Health   Financial Resource Strain: Low Risk   . Difficulty of Paying Living Expenses: Not hard at all  Food Insecurity: No Food Insecurity  . Worried About Charity fundraiser in the Last Year: Never true  . Ran Out of Food in the Last Year: Never true  Transportation Needs: No Transportation Needs  . Lack of Transportation (Medical): No  . Lack of Transportation (Non-Medical): No  Physical Activity: Inactive  . Days of Exercise per Week: 0 days  . Minutes of Exercise per Session: 0 min  Stress:   . Feeling of Stress :   Social Connections:   . Frequency of Communication with Friends and Family:   . Frequency of Social Gatherings with Friends and Family:   . Attends Religious Services:   . Active Member of Clubs or Organizations:   . Attends Archivist Meetings:   Marland Kitchen Marital Status:   Intimate Partner Violence:   . Fear of Current or Ex-Partner:   . Emotionally Abused:   Marland Kitchen Physically Abused:   .  Sexually Abused:  Review of Systems    General:  No chills, fever, night sweats or weight changes.  Cardiovascular:  No chest pain, dyspnea on exertion, edema, orthopnea, palpitations, paroxysmal nocturnal dyspnea. Dermatological: No rash, lesions/masses Respiratory: No cough, dyspnea Urologic: No hematuria, dysuria Abdominal:   No nausea, vomiting, diarrhea, bright red blood per rectum, melena, or hematemesis Neurologic:  No visual changes, wkns, changes in mental status. All other systems reviewed and are otherwise negative except as noted above.  Physical Exam    VS:  There were no vitals taken for this visit. , BMI There is no height or weight on file to calculate BMI. GEN: Well nourished, well developed, in no acute distress. HEENT: normal. Neck: Supple, no JVD, carotid bruits, or masses. Cardiac: RRR, no murmurs, rubs, or gallops. No clubbing, cyanosis, edema.  Radials/DP/PT 2+ and equal bilaterally.  Respiratory:  Respirations regular and unlabored, clear to auscultation bilaterally. GI: Soft, nontender, nondistended, BS + x 4. MS: no deformity or atrophy. Skin: warm and dry, no rash. Neuro:  Strength and sensation are intact. Psych: Normal affect.  Accessory Clinical Findings    ECG personally reviewed by me today- *** - No acute changes  EKG 06/15/2019 Atrial fibrillation 72 bpm  Echocardiogram 11/11/2014 Study Conclusions  - Left ventricle: The cavity size was normal. Wall thickness was normal. Systolic function was normal. The estimated ejection fraction was in the range of 60% to 65%. - Aortic valve: There was mild regurgitation. - Mitral valve: Calcified annulus. Mildly thickened leaflets . There was mild regurgitation. - Left atrium: The atrium was mildly dilated. - Right atrium: The atrium was mildly dilated.  Assessment & Plan   1.  Essential hypertension-BP today***.  Well-controlled at home Continue current medical therapy Heart healthy  low-sodium diet-salty 6 given Increase physical activity as tolerated  Chest pain-no chest pain today.  On previous visit noted to have GERD symptoms. Continue ranitidine Heart healthy low-sodium diet Increase physical activity as tolerated  Chronic atrial fibrillation-heart rate today***.  CHA2DS2-VASc score 4.  4.8% stroke rate/year (hypertension female age Continue atenolol Continue Eliquis at 2.5 mg-weight and age  Chronic diastolic heart failure-chronic lower extremity edema.  No increased work of breathing.  Again states she does not want to increase furosemide due to frequent urination. Continue furosemide Continue amlodipine Continue atenolol Continue ARB Continue Imdur Heart healthy low-sodium diet Daily weights Increase physical activity as tolerated Lower extremity support stockings Elevate extremities when not active  Hyperlipidemia-LDL 68 in November 2019 Continue heart healthy low-sodium high-fiber diet Increase physical activity as tolerated   Jossie Ng. Geanette Buonocore NP-C    12/13/2019, 12:40 PM [place] Office [phone] Fax [phone]

## 2019-12-14 ENCOUNTER — Ambulatory Visit: Payer: Medicare Other | Admitting: General Practice

## 2019-12-14 LAB — BASIC METABOLIC PANEL
Anion gap: 12 (ref 5–15)
BUN: 17 mg/dL (ref 8–23)
CO2: 27 mmol/L (ref 22–32)
Calcium: 7.9 mg/dL — ABNORMAL LOW (ref 8.9–10.3)
Chloride: 95 mmol/L — ABNORMAL LOW (ref 98–111)
Creatinine, Ser: 0.72 mg/dL (ref 0.44–1.00)
GFR calc Af Amer: >60 mL/min (ref >60)
GFR calc non Af Amer: >60 mL/min (ref >60)
Glucose, Bld: 107 mg/dL — ABNORMAL HIGH (ref 70–99)
Potassium: 3.7 mmol/L (ref 3.5–5.1)
Sodium: 134 mmol/L — ABNORMAL LOW (ref 135–145)

## 2019-12-14 NOTE — Plan of Care (Signed)
  Problem: Education: Goal: Knowledge of General Education information will improve Description Including pain rating scale, medication(s)/side effects and non-pharmacologic comfort measures Outcome: Progressing   

## 2019-12-14 NOTE — Care Management Important Message (Signed)
Important Message  Patient Details IM Letter given to Evette Cristal SW Case Manager to present to the Patient Name: Kristen Whitehead MRN: [id number] Date of Birth: 07-Jul-1923   Medicare Important Message Given:  Yes     Kerin Salen 12/14/2019, 11:17 AM

## 2019-12-14 NOTE — Progress Notes (Signed)
Occupational Therapy Treatment Patient Details Name: Kristen Whitehead MRN: [id number] DOB: 09-10-1922 Today's Date: 12/14/2019    History of present illness 86 yo female admitted with acute respiratory failure, UTI, CHF. Hx of A fib, bladder prolapse   OT comments  Patient demonstrates improved safety with functional mobility requiring supervision with in room ambulation with ADLs. Patient used RW appropriately and able to correct herself when walking away from RW. Continue to recommend [facility] OT and assistance from family at discharge.   Follow Up Recommendations  Home health OT;Supervision/Assistance - 24 hour    Equipment Recommendations  Tub/shower seat;Other (comment)    Recommendations for Other Services      Precautions / Restrictions Precautions Precautions: Fall Restrictions Weight Bearing Restrictions: No       Mobility Bed Mobility               General bed mobility comments: oob in recliner  Transfers Overall transfer level: Needs assistance Equipment used: Rolling walker (2 wheeled) Transfers: Sit to/from Stand Sit to Stand: Min guard         General transfer comment: Supervision for safety. Patient had no loss of balance in room. patient stepped away from walker x 1 but then cued herself to grab walker. No overt signs of decreased safety with ambulation.    Balance Overall balance assessment: Needs assistance         Standing balance support: Bilateral upper extremity supported Standing balance-Leahy Scale: Fair                             ADL either performed or assessed with clinical judgement   ADL       Grooming: Supervision/safety                   Toilet Transfer: Supervision/safety   Toileting- Water quality scientist and Hygiene: Supervision/safety         General ADL Comments: Patient performed toileting and grooming at sink without assistance and only supervision from therapist. Patient ambulating with  RW. o2 sat monitored and never dropped below 94% on RA.     Vision       Perception     Praxis      Cognition Arousal/Alertness: Awake/alert Behavior During Therapy: WFL for tasks assessed/performed Overall Cognitive Status: Within Functional Limits for tasks assessed                                          Exercises Other Exercises Other Exercises: sit to stand, 5 reps. pt was able to perform without using UEs/armreste. No LOB.   Shoulder Instructions       General Comments      Pertinent Vitals/ Pain       Pain Assessment: No/denies pain  Home Living                                          Prior Functioning/Environment              Frequency  Min 2X/week        Progress Toward Goals  OT Goals(current goals can now be found in the care plan section)        Plan      Co-evaluation  AM-PAC OT "6 Clicks" Daily Activity     Outcome Measure                    End of Session Equipment Utilized During Treatment: Gait belt;Rolling walker  OT Visit Diagnosis: Other abnormalities of gait and mobility (R26.89);History of falling (Z91.81)   Activity Tolerance Patient tolerated treatment well   Patient Left in chair;with call bell/phone within reach;with chair alarm set   Nurse Communication Mobility status(oxygen status)        Time: [id number] OT Time Calculation (min): 20 min  Charges: OT Treatments $Self Care/Home Management : 8-22 mins  Kristen Whitehead, OTR/L [place]  Office [phone]    Kristen Whitehead 12/14/2019, 12:13 PM

## 2019-12-14 NOTE — TOC Initial Note (Signed)
Transition of Care [facility]) - Initial/Assessment Note    Patient Details  Name: Kristen Whitehead MRN: [id number] Date of Birth: 10/27/1922  Transition of Care [facility]) CM/SW Contact:    Ross Ludwig, LCSW Phone Number: 12/14/2019, 4:59 PM  Clinical Narrative:                  Patient is a 86 year old female who is alert and oriented x4.  Patient is from independent living.  Patient states she has not been to rehab before, patient requested that CSW talk with her daughter to discuss SNF placements as well.  Patient was explained the process, and how insurance pays for stay at [facility].  Patient did not have a preference for SNF, CSW explained to her how insurance has to approve before SNF can take her.  Patient expressed understanding, patient and daughter did not have any other questions at this time.  CSW was given permission to begin bed search in [facility].  Expected Discharge Plan: Skilled Nursing Facility Barriers to Discharge: Insurance Authorization, Continued Medical Work up   Patient Goals and CMS Choice Patient states their goals for this hospitalization and ongoing recovery are:: To go to SNF for short term rehab, then return back home. CMS Medicare.gov Compare Post Acute Care list provided to:: Patient Choice offered to / list presented to : Patient, Adult Children  Expected Discharge Plan and Services Expected Discharge Plan: [place] Choice: [place] Living arrangements for the past 2 months: [place]                                      Prior Living Arrangements/Services Living arrangements for the past 2 months: [place] Lives with:: Self Patient language and need for interpreter reviewed:: Yes Do you feel safe going back to the place where you live?: No   Patient feels she needs some short term rehab, before returning back home.  Need for Family Participation in  Patient Care: No (Comment) Care giver support system in place?: No (comment)   Criminal Activity/Legal Involvement Pertinent to Current Situation/Hospitalization: No - Comment as needed  Activities of Daily Living Home Assistive Devices/Equipment: Eyeglasses ADL Screening (condition at time of admission) Patient's cognitive ability adequate to safely complete daily activities?: Yes Is the patient deaf or have difficulty hearing?: No Does the patient have difficulty seeing, even when wearing glasses/contacts?: No Does the patient have difficulty concentrating, remembering, or making decisions?: No Patient able to express need for assistance with ADLs?: Yes Does the patient have difficulty dressing or bathing?: No Independently performs ADLs?: Yes (appropriate for developmental age) Does the patient have difficulty walking or climbing stairs?: Yes Weakness of Legs: None Weakness of Arms/Hands: None  Permission Sought/Granted Permission sought to share information with : Facility Sport and exercise psychologist, Family Supports Permission granted to share information with : Yes, Verbal Permission Granted  Share Information with NAME: Guido Sander Daughter [phone]  Permission granted to share info w AGENCY: SNF admissions        Emotional Assessment Appearance:: Appears stated age Attitude/Demeanor/Rapport: Engaged Affect (typically observed): Adaptable, Appropriate, Calm Orientation: : Oriented to Self, Oriented to Place, Oriented to  Time, Oriented to Situation Alcohol / Substance Use: Not Applicable Psych Involvement: No (comment)  Admission diagnosis:  Bronchitis [J40] Pulmonary nodule [R91.1] Acute cystitis without hematuria [N30.00] Acute respiratory failure  with hypoxia ([place]) [J96.01] Acute CHF (congestive heart failure) ([place]) [I50.9] Patient Active Problem List   Diagnosis Date Noted  . Acute CHF (congestive heart failure) ([place]) 12/11/2019  . Acute cystitis without hematuria    . Acute respiratory failure with hypoxia ([place]) 12/10/2019  . Localized swelling, mass and lump, upper limb 05/06/2019  . Disorder of thyroid gland 09/14/2018  . Malignant tumor of thyroid gland ([place]) 09/14/2018  . Heart disease 09/14/2018  . Chronic anticoagulation 11/18/2016  . Weakness 11/10/2014  . Acute on chronic diastolic CHF (congestive heart failure) ([place]) 02/01/2014  . Dyspnea 02/01/2014  . Encounter for therapeutic drug monitoring 09/16/2013  . Chest pain at rest 03/12/2012  . Cough 09/16/2011  . Fatigue 04/08/2011  . Essential hypertension 02/11/2011  . Mitral regurgitation 02/11/2011  . Hypothyroidism 02/11/2011  . Hypercholesterolemia 02/11/2011  . Atrial fibrillation ([place]) 12/03/2010   PCP:  Leighton Ruff, MD Pharmacy:   [id number] [place], [place] - [place] [address] [place] [address] Phone: [phone] Fax: [phone]     Social Determinants of Health ([place]) Interventions    Readmission Risk Interventions No flowsheet data found.

## 2019-12-14 NOTE — Progress Notes (Signed)
Physical Therapy Treatment Patient Details Name: Kristen Whitehead MRN: [id number] DOB: [birth year] Today's Date: 12/14/2019    History of Present Illness 86 yo female admitted with acute respiratory failure, UTI, CHF. Hx of A fib, bladder prolapse    PT Comments    Pt continues to participate well. O2 94% on RA during session on today. Discussed d/c plan again on today. Pt has repeatedly stated that she cannot manage at home alone. She is open to discussing rehab options with CM/SW so she can figure out best/safest plan. Will continue to follow.     Follow Up Recommendations  Home health PT;Supervision/Assistance - 24 hour vs SNF(depending on pt/family decision and assist that can be arranged in home. Pt stated she cannot go home alone.)     Equipment Recommendations  None recommended by PT    Recommendations for Other Services       Precautions / Restrictions Precautions Precautions: Fall Restrictions Weight Bearing Restrictions: No    Mobility  Bed Mobility               General bed mobility comments: oob in recliner  Transfers Overall transfer level: Needs assistance Equipment used: Rolling walker (2 wheeled) Transfers: Sit to/from Stand Sit to Stand: Min guard         General transfer comment: min guard for safety.  Ambulation/Gait Ambulation/Gait assistance: Min guard Gait Distance (Feet): 175 Feet Assistive device: Rolling walker (2 wheeled) Gait Pattern/deviations: Step-through pattern;Decreased stride length     General Gait Details: Min guard for safety. Fair gait speed. No LOB with RW use. O2 94% on RA.   Stairs             Wheelchair Mobility    Modified Rankin (Stroke Patients Only)       Balance Overall balance assessment: Needs assistance         Standing balance support: Bilateral upper extremity supported Standing balance-Leahy Scale: Fair                              Cognition Arousal/Alertness:  Awake/alert Behavior During Therapy: WFL for tasks assessed/performed Overall Cognitive Status: Within Functional Limits for tasks assessed                                        Exercises Other Exercises Other Exercises: sit to stand, 5 reps. pt was able to perform without using UEs/armrests. No LOB.    General Comments        Pertinent Vitals/Pain Pain Assessment: No/denies pain    Home Living                      Prior Function            PT Goals (current goals can now be found in the care plan section) Progress towards PT goals: Progressing toward goals    Frequency    Min 3X/week      PT Plan Current plan remains appropriate    Co-evaluation              AM-PAC PT "6 Clicks" Mobility   Outcome Measure  Help needed turning from your back to your side while in a flat bed without using bedrails?: None Help needed moving from lying on your back to sitting on the side of a flat bed  without using bedrails?: None Help needed moving to and from a bed to a chair (including a wheelchair)?: A Little Help needed standing up from a chair using your arms (e.g., wheelchair or bedside chair)?: A Little Help needed to walk in hospital room?: A Little Help needed climbing 3-5 steps with a railing? : A Little 6 Click Score: 20    End of Session Equipment Utilized During Treatment: Gait belt Activity Tolerance: Patient tolerated treatment well Patient left: in chair;with call bell/phone within reach;with chair alarm set   PT Visit Diagnosis: Muscle weakness (generalized) (M62.81);Unsteadiness on feet (R26.81);History of falling (Z91.81)     Time: 1005-1036 PT Time Calculation (min) (ACUTE ONLY): 31 min  Charges:  $Gait Training: 23-37 mins                         Doreatha Massed, PT Acute Rehabilitation

## 2019-12-14 NOTE — NC FL2 (Signed)
[place] LEVEL OF CARE SCREENING TOOL     IDENTIFICATION  Patient Name: Kristen Whitehead Birthdate: [birth year] Sex: female Admission Date (Current Location): 12/10/2019  [facility] and [place] Number:  Herbalist and Address:  [facility],  [place], [place]      Provider Number: [id number]  Attending Physician Name and Address:  [place] Morine Kohlman J, DO  Relative Name and Phone Number:  Cletis, Ingber [phone] or Katz,Mitzi Daughter [phone] or Pearletha Alfred   [phone]    Current Level of Care: Hospital Recommended Level of Care: [place] Prior Approval Number:    Date Approved/Denied:   PASRR Number: [id number]  Discharge Plan: SNF    Current Diagnoses: Patient Active Problem List   Diagnosis Date Noted  . Acute CHF (congestive heart failure) ([place]) 12/11/2019  . Acute cystitis without hematuria   . Acute respiratory failure with hypoxia ([place]) 12/10/2019  . Localized swelling, mass and lump, upper limb 05/06/2019  . Disorder of thyroid gland 09/14/2018  . Malignant tumor of thyroid gland ([place]) 09/14/2018  . Heart disease 09/14/2018  . Chronic anticoagulation 11/18/2016  . Weakness 11/10/2014  . Acute on chronic diastolic CHF (congestive heart failure) ([place]) 02/01/2014  . Dyspnea 02/01/2014  . Encounter for therapeutic drug monitoring 09/16/2013  . Chest pain at rest 03/12/2012  . Cough 09/16/2011  . Fatigue 04/08/2011  . Essential hypertension 02/11/2011  . Mitral regurgitation 02/11/2011  . Hypothyroidism 02/11/2011  . Hypercholesterolemia 02/11/2011  . Atrial fibrillation ([place]) 12/03/2010    Orientation RESPIRATION BLADDER Height & Weight     Self, Time, Situation, Place  O2(2L) Continent Weight: 125 lb 6.4 oz (56.9 kg) Height:  4\' 11"  (149.9 cm)  BEHAVIORAL SYMPTOMS/MOOD NEUROLOGICAL BOWEL NUTRITION STATUS      Continent Diet(Regular diet)  AMBULATORY  STATUS COMMUNICATION OF NEEDS Skin   Limited Assist Verbally                         Personal Care Assistance Level of Assistance  Bathing, Dressing, Feeding Bathing Assistance: Limited assistance Feeding assistance: Limited assistance Dressing Assistance: Limited assistance     Functional Limitations Info  Sight, Speech, Hearing Sight Info: Adequate Hearing Info: Adequate Speech Info: Adequate    SPECIAL CARE FACTORS FREQUENCY  OT (By licensed OT), PT (By licensed PT)     PT Frequency: Minimum 5x a week OT Frequency: Minimum 5x a week            Contractures Contractures Info: Not present    Additional Factors Info  Code Status, Allergies Code Status Info: DNR Allergies Info: Azithromycin Compazine Demerol Dolophine Ebastine Erythromycin Ibuprofen Statins Tetanus Toxoids Tetracyclines & Related           Current Medications (12/14/2019):  This is the current hospital active medication list Current Facility-Administered Medications  Medication Dose Route Frequency Provider Last Rate Last Admin  . 0.9 %  sodium chloride infusion  250 mL Intravenous PRN Opyd, Ilene Qua, MD      . acetaminophen (TYLENOL) tablet 650 mg  650 mg Oral Q4H PRN Opyd, Ilene Qua, MD   650 mg at 12/13/19 0550  . amLODipine (NORVASC) tablet 5 mg  5 mg Oral Daily Opyd, Ilene Qua, MD   5 mg at 12/14/19 1118  . amoxicillin (AMOXIL) capsule 500 mg  500 mg Oral Q8H [place] Donnamarie Poag, DO   500 mg at 12/14/19 1327  . apixaban (ELIQUIS)  tablet 2.5 mg  2.5 mg Oral BID Opyd, Ilene Qua, MD   2.5 mg at 12/14/19 1119  . atenolol (TENORMIN) tablet 50 mg  50 mg Oral BID Vianne Bulls, MD   50 mg at 12/14/19 1119  . benzonatate (TESSALON) capsule 200 mg  200 mg Oral TID Rai, Ripudeep K, MD   200 mg at 12/14/19 1119  . chlorpheniramine-HYDROcodone (TUSSIONEX) 10-8 MG/5ML suspension 5 mL  5 mL Oral Q12H PRN Rai, Ripudeep K, MD   5 mL at 12/12/19 0948  . dorzolamide (TRUSOPT) 2 % ophthalmic solution 1 drop  1  drop Left Eye BID Opyd, Ilene Qua, MD   1 drop at 12/14/19 1120  . furosemide (LASIX) injection 40 mg  40 mg Intravenous Q12H Vianne Bulls, MD   40 mg at 12/14/19 [id number]  . irbesartan (AVAPRO) tablet 150 mg  150 mg Oral BID Opyd, Ilene Qua, MD   150 mg at 12/14/19 1118  . isosorbide mononitrate (IMDUR) 24 hr tablet 30 mg  30 mg Oral Daily Opyd, Ilene Qua, MD   30 mg at 12/14/19 1118  . latanoprost (XALATAN) 0.005 % ophthalmic solution 1 drop  1 drop Both Eyes QHS Opyd, Ilene Qua, MD   1 drop at 12/13/19 2102  . levothyroxine (SYNTHROID) tablet 100 mcg  100 mcg Oral Q0600 Vianne Bulls, MD   100 mcg at 12/14/19 [id number]  . loratadine (CLARITIN) tablet 10 mg  10 mg Oral Daily Rai, Ripudeep K, MD   10 mg at 12/14/19 1119  . melatonin tablet 3 mg  3 mg Oral QHS PRN Rai, Ripudeep K, MD      . menthol-cetylpyridinium (CEPACOL) lozenge 3 mg  1 lozenge Oral PRN Rai, Ripudeep K, MD      . ondansetron (ZOFRAN) injection 4 mg  4 mg Intravenous Q6H PRN Opyd, Ilene Qua, MD   4 mg at 12/12/19 1119  . pantoprazole (PROTONIX) EC tablet 40 mg  40 mg Oral Daily Opyd, Ilene Qua, MD   40 mg at 12/14/19 1119  . potassium chloride (KLOR-CON) CR tablet 10 mEq  10 mEq Oral Daily Opyd, Ilene Qua, MD   10 mEq at 12/14/19 1119  . pramipexole (MIRAPEX) tablet 0.25 mg  0.25 mg Oral BID Rai, Ripudeep K, MD   0.25 mg at 12/14/19 1119  . sodium chloride (MURO 128) 5 % ophthalmic solution 1 drop  1 drop Right Eye TID Rai, Ripudeep K, MD   1 drop at 12/14/19 1120  . sodium chloride flush (NS) 0.9 % injection 3 mL  3 mL Intravenous Q12H Opyd, Ilene Qua, MD   3 mL at 12/14/19 1120  . sodium chloride flush (NS) 0.9 % injection 3 mL  3 mL Intravenous PRN Opyd, Ilene Qua, MD      . temazepam (RESTORIL) capsule 15 mg  15 mg Oral QHS Opyd, Ilene Qua, MD   15 mg at 12/13/19 2101     Discharge Medications: Please see discharge summary for a list of discharge medications.  Relevant Imaging Results:  Relevant Lab  Results:   Additional Information SSN [id number]  Ross Ludwig, LCSW

## 2019-12-14 NOTE — Plan of Care (Signed)
  Problem: Education: Goal: Knowledge of General Education information will improve Description: Including pain rating scale, medication(s)/side effects and non-pharmacologic comfort measures 12/14/2019 1509 by Zadie Rhine, RN Outcome: Progressing 12/14/2019 1337 by Zadie Rhine, RN Outcome: Progressing

## 2019-12-14 NOTE — Progress Notes (Signed)
PROGRESS NOTE    Kristen Whitehead  [id number] DOB: Apr 09, 1923 DOA: 12/10/2019 PCP: Leighton Ruff, MD    Brief Narrative:  Kristen RYNEARSON is a 86 year old female with history of A. fib on Eliquis, CAD, hypertension, hypothyroidism presented to ED with shortness of breath and fatigue.  Patient was seen in ED few days ago and was started on Keflex for suspected UTI and was given a dose of IV Lasix.  Patient continued to feel short of breath, weak requiring an extra pillow for the last few nights to sleep without becoming too dyspneic.  Denied any fevers or chills but reported a mild cough.  In ED, O2 sats down to mid 80s on room air, tachypneic.  Chest x-ray showed mild hazy interstitial opacities bilaterally.  Covid and influenza PCR negative   Assessment & Plan:   Principal Problem:   Acute respiratory failure with hypoxia (HCC) Active Problems:   Atrial fibrillation (HCC)   Essential hypertension   Hypothyroidism   Acute on chronic diastolic CHF (congestive heart failure) (HCC)   Acute cystitis without hematuria   Acute CHF (congestive heart failure) (HCC)   Acute respiratory failure with hypoxia Acute on chronic diastolic CHF Patient presenting with several day history of progressive dyspnea, fatigue, hypoxia with O2 saturations dropping to the 70s with mild exertion.  Chest x-ray notable for pulmonary edema.  TTE with EF 70-75%, no regional wall motion abnormalities, indeterminant diastolic parameters. --Net -1.5 L with 3 unmeasured urinary occurrences past 24 hours --Continue furosemide 40 mg IV q12h --Strict I's and O's and daily weights  Atrial fibrillation CHA2DS2-VASc 6  --Continue Eliquis 2.5 mg p.o. BID, atenolol 50mg  PO BID  Acute laryngitis Patient reported laryngitis for the last 3 days, prior to admission, worse with cough Difficulty speaking due to throat pain.  --Continue Claritin, Tessalon Perles, Cepacol, Tussionex --Amoxicillin for acute  laryngitis for total 5 days  Essential hypertension BP 130/70 this morning, stable. --Continue atenolol 50mg  BID, irbesartan 150 mg BID, isosorbide mononitrate 30 mg PO daily  Hypothyroidism --Continue Synthroid 100 mcg p.o. daily  Acute cystitis without hematuria Patient was placed on Keflex on 4/22, completed Keflex on 4/24, currently on amoxicillin Urine culture showed 20,000 colonies of Pseudomonas aeruginosa, currently no complaints of hematuria or dysuria  CAD No anginal symptoms --Isosorbide mononitrate 30mg  PO daily, atenolol 50mg  BID  Generalized debility, failure to thrive PT/OT evaluation with recommendations of home health if 24/7 care available, unfortunately that is not at this time.   --Wll proceed with SNF placement.  Case management notified.  Restless leg syndrome, cramps --pramipexole to 0.25 mg twice daily   DVT prophylaxis: Eliquis Code Status: DNR Family Communication: Discussed extensively with patient at bedside  Disposition Plan:  Status is: Inpatient  Remains inpatient appropriate because:Ongoing diagnostic testing needed not appropriate for outpatient work up, Unsafe d/c plan, IV treatments appropriate due to intensity of illness or inability to take PO and Inpatient level of care appropriate due to severity of illness   Dispo: The patient is from: Home              Anticipated d/c is to: SNF              Anticipated d/c date is: 2 days              Patient currently is medically stable to d/c.   Consultants:   none  Procedures:   none  Antimicrobials:   Amoxicillin 4/24>>  Cephalexin 4/22 - 4/23  Subjective: Patient seen and examined bedside, resting comfortably in bedside chair.  Continues with global weakness.  States that she lives alone and the plan is to have her move in with her granddaughter over the next several weeks once they moved to [place].  She feels that she is unable to manage her ADLs at home including  cooking, cleaning and bathing; and patient requesting rehab placement with eventual plan to return home with granddaughter once she moves into her new house.  No other complaints or concerns at this time.  Denies headache, no fever/chills/night sweats, no nausea cefonicid/diarrhea, no chest pain, no palpitations, no abdominal pain, no cough/congestion.  No acute events overnight per nursing staff.  Objective: Vitals:   12/13/19 2115 12/14/19 0500 12/14/19 0549 12/14/19 1203  BP: (!) 127/58  130/70   Pulse: 77  69   Resp: 16  18   Temp: 97.9 F (36.6 C)  98 F (36.7 C)   TempSrc:      SpO2: 92%  93% 94%  Weight:  56.9 kg    Height:        Intake/Output Summary (Last 24 hours) at 12/14/2019 1342 Last data filed at 12/14/2019 0300 Gross per 24 hour  Intake 360 ml  Output -  Net 360 ml   Filed Weights   12/12/19 0456 12/13/19 0434 12/14/19 0500  Weight: 58.2 kg 56.9 kg 56.9 kg    Examination:  General exam: Appears calm and comfortable  Respiratory system: Breath sounds slightly decreased bilateral bases with mild crackles.  No wheezing.  Normal respiratory effort without accessory muscle use.  On 2 L nasal cannula Cardiovascular system: S1 & S2 heard, RRR. No JVD, murmurs, rubs, gallops or clicks. No pedal edema. Gastrointestinal system: Abdomen is nondistended, soft and nontender. No organomegaly or masses felt. Normal bowel sounds heard. Central nervous system: Alert and oriented. No focal neurological deficits. Extremities: Symmetric 5 x 5 power. Skin: No rashes, lesions or ulcers Psychiatry: Judgement and insight appear normal. Mood & affect appropriate.     Data Reviewed: I have personally reviewed following labs and imaging studies  CBC: Recent Labs  Lab 12/09/19 1907 12/10/19 1754  WBC 13.0* 9.6  NEUTROABS 9.9* 6.5  HGB 13.9 12.2  HCT 42.2 36.2  MCV 94.6 94.8  PLT 209 [id number]   Basic Metabolic Panel: Recent Labs  Lab 12/09/19 1907 12/09/19 1907 12/10/19 1754  12/11/19 0403 12/12/19 0439 12/13/19 0346 12/14/19 0355  NA 134*   < > 133* 138 137 136 134*  K 4.1   < > 3.4* 3.7 3.3* 4.0 3.7  CL 95*   < > 95* 100 98 98 95*  CO2 29   < > 29 27 28 29 27   GLUCOSE 107*   < > 101* 101* 101* 92 107*  BUN 11   < > 14 13 14 15 17   CREATININE 0.69   < > 0.68 0.67 0.71 0.79 0.72  CALCIUM 8.0*   < > 7.6* 7.5* 7.2* 7.4* 7.9*  MG 2.4  --   --  2.0  --   --   --    < > = values in this interval not displayed.   GFR: Estimated Creatinine Clearance: 32.3 mL/min (by C-G formula based on SCr of 0.72 mg/dL). Liver Function Tests: Recent Labs  Lab 12/09/19 1907  AST 34  ALT 28  ALKPHOS 134*  BILITOT 1.8*  PROT 7.5  ALBUMIN 4.1   No results for input(s): LIPASE, AMYLASE in the last  168 hours. No results for input(s): AMMONIA in the last 168 hours. Coagulation Profile: No results for input(s): INR, PROTIME in the last 168 hours. Cardiac Enzymes: No results for input(s): CKTOTAL, CKMB, CKMBINDEX, TROPONINI in the last 168 hours. BNP (last 3 results) Recent Labs    01/05/19 1216  PROBNP 2,323*   HbA1C: No results for input(s): HGBA1C in the last 72 hours. CBG: No results for input(s): GLUCAP in the last 168 hours. Lipid Profile: No results for input(s): CHOL, HDL, LDLCALC, TRIG, CHOLHDL, LDLDIRECT in the last 72 hours. Thyroid Function Tests: No results for input(s): TSH, T4TOTAL, FREET4, T3FREE, THYROIDAB in the last 72 hours. Anemia Panel: No results for input(s): VITAMINB12, FOLATE, FERRITIN, TIBC, IRON, RETICCTPCT in the last 72 hours. Sepsis Labs: Recent Labs  Lab 12/09/19 1907  LATICACIDVEN 1.2    Recent Results (from the past 240 hour(s))  Respiratory Panel by RT PCR (Flu A&B, Covid) - Nasopharyngeal Swab     Status: None   Collection Time: 12/10/19  7:23 PM   Specimen: Nasopharyngeal Swab  Result Value Ref Range Status   SARS Coronavirus 2 by RT PCR NEGATIVE NEGATIVE Final    Comment: (NOTE) SARS-CoV-2 target nucleic acids are  NOT DETECTED. The SARS-CoV-2 RNA is generally detectable in upper respiratoy specimens during the acute phase of infection. The lowest concentration of SARS-CoV-2 viral copies this assay can detect is 131 copies/mL. A negative result does not preclude SARS-Cov-2 infection and should not be used as the sole basis for treatment or other patient management decisions. A negative result may occur with  improper specimen collection/handling, submission of specimen other than nasopharyngeal swab, presence of viral mutation(s) within the areas targeted by this assay, and inadequate number of viral copies (<131 copies/mL). A negative result must be combined with clinical observations, patient history, and epidemiological information. The expected result is Negative. Fact Sheet for Patients:  PinkCheek.be Fact Sheet for Healthcare Providers:  GravelBags.it This test is not yet ap proved or cleared by the [place] FDA and  has been authorized for detection and/or diagnosis of SARS-CoV-2 by FDA under an Emergency Use Authorization (EUA). This EUA will remain  in effect (meaning this test can be used) for the duration of the COVID-19 declaration under Section 564(b)(1) of the Act, 21 U.S.C. section 360bbb-3(b)(1), unless the authorization is terminated or revoked sooner.    Influenza A by PCR NEGATIVE NEGATIVE Final   Influenza B by PCR NEGATIVE NEGATIVE Final    Comment: (NOTE) The Xpert Xpress SARS-CoV-2/FLU/RSV assay is intended as an aid in  the diagnosis of influenza from Nasopharyngeal swab specimens and  should not be used as a sole basis for treatment. Nasal washings and  aspirates are unacceptable for Xpert Xpress SARS-CoV-2/FLU/RSV  testing. Fact Sheet for Patients: PinkCheek.be Fact Sheet for Healthcare Providers: GravelBags.it This test is not yet approved or  cleared by the [place] FDA and  has been authorized for detection and/or diagnosis of SARS-CoV-2 by  FDA under an Emergency Use Authorization (EUA). This EUA will remain  in effect (meaning this test can be used) for the duration of the  Covid-19 declaration under Section 564(b)(1) of the Act, 21  U.S.C. section 360bbb-3(b)(1), unless the authorization is  terminated or revoked. Performed at [facility], [place] [address], [place], [place] [address]   Urine Culture     Status: Abnormal   Collection Time: 12/11/19 10:59 AM   Specimen: Urine, Random  Result Value Ref Range Status  Specimen Description   Final    URINE, RANDOM Performed at [facility], [place] [address], [place], [place] [address]    Special Requests   Final    NONE Performed at [facility], [place] [address] [place], [place] [address]    Culture 20,000 COLONIES/mL PSEUDOMONAS AERUGINOSA (A)  Final   Report Status 12/13/2019 FINAL  Final   Organism ID, Bacteria PSEUDOMONAS AERUGINOSA (A)  Final      Susceptibility   Pseudomonas aeruginosa - MIC*    CEFTAZIDIME 4 SENSITIVE Sensitive     CIPROFLOXACIN <=0.25 SENSITIVE Sensitive     GENTAMICIN <=1 SENSITIVE Sensitive     IMIPENEM 2 SENSITIVE Sensitive     PIP/TAZO 8 SENSITIVE Sensitive     CEFEPIME 2 SENSITIVE Sensitive     * 20,000 COLONIES/mL PSEUDOMONAS AERUGINOSA         Radiology Studies: No results found.      Scheduled Meds: . amLODipine  5 mg Oral Daily  . amoxicillin  500 mg Oral Q8H  . apixaban  2.5 mg Oral BID  . atenolol  50 mg Oral BID  . benzonatate  200 mg Oral TID  . dorzolamide  1 drop Left Eye BID  . furosemide  40 mg Intravenous Q12H  . irbesartan  150 mg Oral BID  . isosorbide mononitrate  30 mg Oral Daily  . latanoprost  1 drop Both Eyes QHS  . levothyroxine  100 mcg Oral Q0600  . loratadine  10 mg Oral Daily  . pantoprazole  40 mg Oral Daily  . potassium chloride   10 mEq Oral Daily  . pramipexole  0.25 mg Oral BID  . sodium chloride  1 drop Right Eye TID  . sodium chloride flush  3 mL Intravenous Q12H  . temazepam  15 mg Oral QHS   Continuous Infusions: . sodium chloride       LOS: 3 days    Time spent: 35 minutes spent on chart review, discussion with nursing staff, consultants, updating family and interview/physical exam; more than 50% of that time was spent in counseling and/or coordination of care.    Gardner Servantes J [place] DO [facility] Available via Epic secure chat 7am-7pm After these hours, please refer to coverage provider listed on [email or web address] 12/14/2019, 1:42 PM

## 2019-12-15 LAB — RESPIRATORY PANEL BY RT PCR (FLU A&B, COVID)
Influenza A by PCR: NEGATIVE
Influenza B by PCR: NEGATIVE
SARS Coronavirus 2 by RT PCR: NEGATIVE

## 2019-12-15 LAB — BASIC METABOLIC PANEL
Anion gap: 10 (ref 5–15)
BUN: 18 mg/dL (ref 8–23)
CO2: 30 mmol/L (ref 22–32)
Calcium: 8.1 mg/dL — ABNORMAL LOW (ref 8.9–10.3)
Chloride: 94 mmol/L — ABNORMAL LOW (ref 98–111)
Creatinine, Ser: 0.8 mg/dL (ref 0.44–1.00)
GFR calc Af Amer: >60 mL/min (ref >60)
GFR calc non Af Amer: >60 mL/min (ref >60)
Glucose, Bld: 100 mg/dL — ABNORMAL HIGH (ref 70–99)
Potassium: 3.4 mmol/L — ABNORMAL LOW (ref 3.5–5.1)
Sodium: 134 mmol/L — ABNORMAL LOW (ref 135–145)

## 2019-12-15 MED ORDER — PRAMIPEXOLE DIHYDROCHLORIDE 0.25 MG PO TABS: 0.2500 mg | ORAL_TABLET | Freq: Two times a day (BID) | ORAL | 0 refills | Status: DC

## 2019-12-15 MED ORDER — AMOXICILLIN 500 MG PO CAPS: 500.0000 mg | ORAL_CAPSULE | Freq: Three times a day (TID) | ORAL | 0 refills | Status: AC

## 2019-12-15 MED ORDER — BENZONATATE 100 MG PO CAPS
100.0000 mg | ORAL_CAPSULE | Freq: Three times a day (TID) | ORAL | 0 refills | Status: DC | PRN
Start: 2019-12-15 — End: 2021-04-19

## 2019-12-15 MED ORDER — FUROSEMIDE 40 MG PO TABS
40.0000 mg | ORAL_TABLET | Freq: Two times a day (BID) | ORAL | 0 refills | Status: DC
Start: 2019-12-15 — End: 2020-02-16

## 2019-12-15 MED ORDER — LORATADINE 10 MG PO TABS: 10.0000 mg | ORAL_TABLET | Freq: Every day | ORAL | 0 refills | Status: DC

## 2019-12-15 MED ORDER — POTASSIUM CHLORIDE CRYS ER 20 MEQ PO TBCR
30.0000 meq | EXTENDED_RELEASE_TABLET | ORAL | Status: AC
  Administered 2019-12-15 (×2): 30 meq via ORAL
  Filled 2019-12-15 (×2): qty 1

## 2019-12-15 MED ORDER — TEMAZEPAM 15 MG PO CAPS: 15.0000 mg | ORAL_CAPSULE | Freq: Every day | ORAL | 0 refills | Status: DC

## 2019-12-15 NOTE — Progress Notes (Signed)
Have made several attempts to call and provide report on the patient. Spoke with Network engineer at the [employer], [place], Will ask the nurse at the facility to follow up with call. EMS have arrived to pick up patient, instructions reviewed with patient and pt daughter over the phone.SRP,RN

## 2019-12-15 NOTE — TOC Transition Note (Signed)
Transition of Care [facility]) - CM/SW Discharge Note   Patient Details  Name: Kristen Whitehead MRN: [id number] Date of Birth: October 23, 1922  [facility] CM/SW Contact:  Ross Ludwig, LCSW Phone Number: 12/15/2019, 12:48 PM   Clinical Narrative:     CSW received phone call from patient's insurance company she has been approved, Engineer, building services number is [id number], [facility] authorization number is [id number].  CSW spoke to [facility], they are able to accept patient today.  Patient to be d/c'ed today to [facility] room 104P.  Patient and family agreeable to plans will transport via ems RN to call report [phone].  CSW notified patient's daughter who is aware that patient is discharging today.  Final next level of care: [place] Barriers to Discharge: No Barriers Identified   Patient Goals and CMS Choice Patient states their goals for this hospitalization and ongoing recovery are:: To go to SNF for short term rehab, then return back home. CMS Medicare.gov Compare Post Acute Care list provided to:: Patient Represenative (must comment) Choice offered to / list presented to : Adult Children  Discharge Placement PASRR number recieved: 12/14/19            Patient chooses bed at: [facility] Patient to be transferred to facility by: [facility] Name of family member notified: Patient's daughter Lorita Officer [phone] Patient and family notified of of transfer: 12/15/19  Discharge Plan and Services     Post Acute Care Choice: [place]          DME Arranged: N/A DME Agency: NA       HH Arranged: NA HH Agency: NA        Social Determinants of Health (SDOH) Interventions     Readmission Risk Interventions No flowsheet data found.

## 2019-12-15 NOTE — Plan of Care (Signed)
  Problem: Education: Goal: Knowledge of General Education information will improve Description: Including pain rating scale, medication(s)/side effects and non-pharmacologic comfort measures Outcome: Progressing   Problem: Health Behavior/Discharge Planning: Goal: Ability to manage health-related needs will improve Outcome: Progressing   Problem: Education: Goal: Knowledge of General Education information will improve Description: Including pain rating scale, medication(s)/side effects and non-pharmacologic comfort measures Outcome: Progressing   

## 2019-12-15 NOTE — Discharge Summary (Signed)
Physician Discharge Summary  Kristen Whitehead [id number] DOB: 16-Oct-1922 DOA: 12/10/2019  PCP: Leighton Ruff, MD  Admit date: 12/10/2019 Discharge date: 12/15/2019  Admitted From: Home  Disposition: [facility]  Recommendations for Outpatient Follow-up:  1. Follow up with PCP in 1-2 weeks 2. Increase furosemide to 40 mg p.o. twice daily 3. Continue amoxicillin for 1 day for acute laryngitis 4. Please obtain BMP in one week to assess renal function and potassium level   Home Health: No Equipment/Devices: None  Discharge Condition: Stable CODE STATUS: DNR Diet recommendation: Cardiac, 2 g salt restricted diet  History of present illness:  Kristen Whitehead is a 86 year old female with history of A. fib on Eliquis, CAD, hypertension, hypothyroidism presented to ED with shortness of breath and fatigue. Patient was seen in ED few days ago and was started on Keflex for suspected UTI and was given a dose of IV Lasix. Patient continued to feel short of breath, weak requiring an extra pillow for the last few nights to sleep without becoming too dyspneic. Denied any fevers or chills but reported a mild cough.  In ED, O2 sats down to mid 80s on room air, tachypneic. Chest x-ray showed mild hazy interstitial opacities bilaterally. Covid and influenza PCR negative  Hospital course:  Acute respiratory failure with hypoxia Acute on chronic diastolic CHF Patient presenting with several day history of progressive dyspnea, fatigue, hypoxia with O2 saturations dropping to the 70s with mild exertion.  Chest x-ray notable for pulmonary edema.  TTE with EF 70-75%, no regional wall motion abnormalities, indeterminant diastolic parameters.  Patient was diuresed aggressively with IV furosemide during hospitalization.  Will discharge on increased dose of furosemide 40 mg p.o. twice daily.  Recommend repeat BMP in 1 week to assess renal function and potassium level.  Recommend continue to  monitor daily weights.  Atrial fibrillation CHA2DS2-VASc 6. Continue Eliquis 2.5 mg p.o. BID, atenolol 50mg  PO BID  Acute laryngitis Patient reported laryngitis for the last 3 days,prior to admission, worse with coughDifficulty speaking due to throat pain.  Continue Claritin, Tessalon Perles as needed.  Continue amoxicillin to complete a 5-day course.  Essential hypertension BP 102/62 this morning, stable. Continue atenolol 50mg  BID, irbesartan 150 mg BID, isosorbide mononitrate 30 mg PO daily  Hypothyroidism Continue Synthroid 100 mcg p.o. daily  Acute cystitis without hematuria Patient was placed on Keflex on 4/22, completed Keflex on 4/24, currently on amoxicillin. Urine culture showed 20,000 colonies of Pseudomonas aeruginosa, currently no complaints of hematuria or dysuria  CAD No anginal symptoms.  Continue isosorbide mononitrate 30mg  PO daily, atenolol 50mg  BID  Generalized debility, failure to thrive PT/OT evaluation with recommendations of home health if 24/7 care available, unfortunately that is not at this time.   Will be discharging to [facility] for further rehabilitation with likely plan long-term to live with granddaughter.   Restless leg syndrome, cramps Continue pramipexole to 0.25 mg twice daily  Insomnia Continue temazepam 50 mg p.o. nightly  Discharge Diagnoses:  Principal Problem:   Acute respiratory failure with hypoxia (HCC) Active Problems:   Atrial fibrillation (HCC)   Essential hypertension   Hypothyroidism   Acute on chronic diastolic CHF (congestive heart failure) (HCC)   Acute cystitis without hematuria   Acute CHF (congestive heart failure) ([place])    Discharge Instructions  Discharge Instructions    Diet - low sodium heart healthy   Complete by: As directed    Increase activity slowly   Complete by: As directed  Allergies as of 12/15/2019      Reactions   Azithromycin Nausea Only   Compazine [prochlorperazine  Edisylate] Nausea Only   Demerol Nausea Only   Dolophine [methadone] Nausea Only   Ebastine Nausea Only   EBS   Erythromycin Nausea Only   Ibuprofen Nausea Only   Statins Other (See Comments)   myalgias   Tetanus Toxoids Nausea Only   Tetracyclines & Related Nausea Only      Medication List    STOP taking these medications   cephALEXin 500 MG capsule Commonly known as: KEFLEX     TAKE these medications   amLODipine 2.5 MG tablet Commonly known as: NORVASC Take 2 tablets (5 mg total) by mouth daily.   amoxicillin 500 MG capsule Commonly known as: AMOXIL Take 1 capsule (500 mg total) by mouth every 8 (eight) hours for 1 day.   apixaban 2.5 MG Tabs tablet Commonly known as: Eliquis Take 1 tablet (2.5 mg total) by mouth 2 (two) times daily.   atenolol 50 MG tablet Commonly known as: TENORMIN Take 1 tablet (50 mg total) by mouth 2 (two) times daily.   benzonatate 100 MG capsule Commonly known as: TESSALON Take 1 capsule (100 mg total) by mouth every 8 (eight) hours as needed for cough.   CALCIUM 600+D PO Take 1 tablet by mouth 2 (two) times daily.   dorzolamide 2 % ophthalmic solution Commonly known as: TRUSOPT Place 1 drop into the left eye 2 (two) times daily.   furosemide 40 MG tablet Commonly known as: Lasix Take 1 tablet (40 mg total) by mouth 2 (two) times daily. What changed:   how much to take  when to take this  additional instructions   irbesartan 300 MG tablet Commonly known as: AVAPRO Take 0.5 tablets (150 mg total) by mouth 2 (two) times daily.   isosorbide mononitrate 30 MG 24 hr tablet Commonly known as: IMDUR Take 1 tablet (30 mg total) by mouth daily.   latanoprost 0.005 % ophthalmic solution Commonly known as: XALATAN Place 1 drop into both eyes at bedtime.   levothyroxine 100 MCG tablet Commonly known as: SYNTHROID Take 100 mcg by mouth daily.   loratadine 10 MG tablet Commonly known as: CLARITIN Take 1 tablet (10 mg total) by  mouth daily. Start taking on: December 16, 2019   MAGNESIUM OXIDE 400 PO Take 800 mg by mouth daily.   nitroGLYCERIN 0.4 MG SL tablet Commonly known as: Nitrostat DISSOLVE 1 TABLET UNDER THE TONGUE EVERY 5 MINUTES FOR 3 DOSES AS NEEDED  FOR  CHEST  PAIN   omeprazole 40 MG capsule Commonly known as: PRILOSEC Take 1 capsule (40 mg total) by mouth daily.   potassium chloride 10 MEQ tablet Commonly known as: KLOR-CON Take 1 tablet (10 mEq total) by mouth daily.   pramipexole 0.25 MG tablet Commonly known as: MIRAPEX Take 1 tablet (0.25 mg total) by mouth 2 (two) times daily. What changed:   medication strength  how much to take  when to take this   Premarin vaginal cream Generic drug: conjugated estrogens Place 2 g vaginally once a week. Uses on Sunday and wednesday   PRESERVISION AREDS 2 PO Take 1 capsule by mouth 2 (two) times daily.   CENTRUM SILVER PO Take 1 tablet by mouth daily.   PROBIOTIC DAILY PO Take 1 tablet by mouth daily.   sodium chloride 5 % ophthalmic solution Commonly known as: MURO 128 Place 1 drop into the right eye 3 (three) times  daily.   temazepam 15 MG capsule Commonly known as: RESTORIL Take 1 capsule (15 mg total) by mouth at bedtime.   Vitamin D 50 MCG (2000 UT) tablet Take 4,000 Units by mouth daily.            Durable Medical Equipment  (From admission, onward)         Start     Ordered   12/13/19 1638  For home use only DME oxygen  Once    Question Answer Comment  Length of Need 12 Months   Mode or (Route) Nasal cannula   Liters per Minute 2   Frequency Continuous (stationary and portable oxygen unit needed)   Oxygen conserving device Yes   Oxygen delivery system Gas      12/13/19 1637          Allergies  Allergen Reactions  . Azithromycin Nausea Only  . Compazine [Prochlorperazine Edisylate] Nausea Only  . Demerol Nausea Only  . Dolophine [Methadone] Nausea Only  . Ebastine Nausea Only    EBS  . Erythromycin  Nausea Only  . Ibuprofen Nausea Only  . Statins Other (See Comments)    myalgias  . Tetanus Toxoids Nausea Only  . Tetracyclines & Related Nausea Only    Consultations:  none   Procedures/Studies: DG Chest 2 View  Addendum Date: 12/10/2019   ADDENDUM REPORT: 12/10/2019 18:53 ADDENDUM: More nodular opacity in the right hilum, favor overlapping vessels though adenopathy or a pulmonary nodule cannot be fully excluded. Could consider further evaluation cross-sectional imaging. Notification of the addendum submission with skull by telephone on 12/10/2019 at 6:52 pm to the nurse of provider [facility] , who verbally acknowledged these results. Electronically Signed   By: Lovena Le M.D.   On: 12/10/2019 18:53   Result Date: 12/10/2019 CLINICAL DATA:  Cough EXAM: CHEST - 2 VIEW COMPARISON:  Radiograph 12/09/2019, CT 06/11/2017 FINDINGS: Mild airways thickening with some increasing hazy and interstitial opacities in the lungs. More focal opacity in right hilum may reflect overlapping vessels though is overall indeterminate. Nodules seen on comparison CT from 2018 are not well visualized on this radiographic examination. Stable mild cardiomegaly. The aorta is calcified. The remaining cardiomediastinal contours are unremarkable. No acute osseous or soft tissue abnormality. Degenerative changes are present in the imaged spine and shoulders. Surgical clips at the base of the neck may reflect prior thyroidectomy. Telemetry leads overlie the chest. IMPRESSION: Mild airways thickening with some increasing hazy and interstitial opacities in the lungs. Findings may reflect bronchitis, edema or atypical infection. Electronically Signed: By: Lovena Le M.D. On: 12/10/2019 18:34   DG Chest Port 1 View  Result Date: 12/09/2019 CLINICAL DATA:  Generalized weakness for 2 days, coughing, rhinorrhea EXAM: PORTABLE CHEST 1 VIEW COMPARISON:  10/10/2018 FINDINGS: Single frontal view of the chest demonstrates an  enlarged cardiac silhouette. No acute airspace disease, effusion, or pneumothorax. Chronic background scarring. IMPRESSION: 1. Stable exam, no acute process. Electronically Signed   By: Randa Ngo M.D.   On: 12/09/2019 19:04   ECHOCARDIOGRAM COMPLETE  Result Date: 12/11/2019    ECHOCARDIOGRAM REPORT   Patient Name:   TYNASHA MINISH Date of Exam: 12/11/2019 Medical Rec #:  [id number]           Height:       59.0 in Accession #:    [id number]          Weight:       124.0 lb Date of Birth:  Feb 06, 1923  BSA:          1.505 m Patient Age:    86 years            BP:           111/47 mmHg Patient Gender: F                   HR:           74 bpm. Exam Location:  Inpatient Procedure: 2D Echo Indications:    428.31 CHF  History:        Patient has prior history of Echocardiogram examinations, most                 recent 01/07/2018. CHF; Risk Factors:Hypertension and                 Dyslipidemia. MVP. cancer.  Sonographer:    Jannett Celestine RDCS (AE) Referring Phys: [id number] RIPUDEEP K RAI IMPRESSIONS  1. Left ventricular ejection fraction, by estimation, is 70 to 75%. The left ventricle has hyperdynamic function. The left ventricle has no regional wall motion abnormalities. Left ventricular diastolic parameters are indeterminate.  2. Right ventricular systolic function is normal. The right ventricular size is normal.  3. Left atrial size was mildly dilated.  4. Right atrial size was mildly dilated.  5. The mitral valve is normal in structure. Trivial mitral valve regurgitation. No evidence of mitral stenosis.  6. The aortic valve is tricuspid. Aortic valve regurgitation is trivial. Mild to moderate aortic valve sclerosis/calcification is present, without any evidence of aortic stenosis.  7. The inferior vena cava is dilated in size with <50% respiratory variability, suggesting right atrial pressure of 15 mmHg. Comparison(s): No significant change from prior study. Prior images reviewed side by side. FINDINGS   Left Ventricle: Left ventricular ejection fraction, by estimation, is 70 to 75%. The left ventricle has hyperdynamic function. The left ventricle has no regional wall motion abnormalities. The left ventricular internal cavity size was normal in size. There is no left ventricular hypertrophy. Left ventricular diastolic parameters are indeterminate. Right Ventricle: The right ventricular size is normal. No increase in right ventricular wall thickness. Right ventricular systolic function is normal. Left Atrium: Left atrial size was mildly dilated. Right Atrium: Right atrial size was mildly dilated. Pericardium: There is no evidence of pericardial effusion. Mitral Valve: The mitral valve is normal in structure. Normal mobility of the mitral valve leaflets. Severe mitral annular calcification. Trivial mitral valve regurgitation. No evidence of mitral valve stenosis. Tricuspid Valve: The tricuspid valve is normal in structure. Tricuspid valve regurgitation is trivial. No evidence of tricuspid stenosis. Aortic Valve: The aortic valve is tricuspid. Aortic valve regurgitation is trivial. Mild to moderate aortic valve sclerosis/calcification is present, without any evidence of aortic stenosis. Pulmonic Valve: The pulmonic valve was normal in structure. Pulmonic valve regurgitation is not visualized. No evidence of pulmonic stenosis. Aorta: The aortic root is normal in size and structure. Venous: The inferior vena cava is dilated in size with less than 50% respiratory variability, suggesting right atrial pressure of 15 mmHg. IAS/Shunts: No atrial level shunt detected by color flow Doppler.  LEFT VENTRICLE PLAX 2D LVIDd:         3.70 cm LVIDs:         2.10 cm LV PW:         1.00 cm LV IVS:        1.00 cm LVOT diam:     2.00 cm LV SV:  47 LV SV Index:   31 LVOT Area:     3.14 cm  LEFT ATRIUM             Index       RIGHT ATRIUM           Index LA diam:        4.10 cm 2.72 cm/m  RA Area:     18.20 cm LA Vol (A2C):    53.3 ml 35.41 ml/m RA Volume:   41.90 ml  27.84 ml/m LA Vol (A4C):   25.0 ml 16.61 ml/m LA Biplane Vol: 36.4 ml 24.18 ml/m  AORTIC VALVE LVOT Vmax:   73.80 cm/s LVOT Vmean:  51.900 cm/s LVOT VTI:    0.149 m  AORTA Ao Root diam: 2.80 cm MITRAL VALVE MV Area (PHT): 3.99 cm     SHUNTS MV Decel Time: 190 msec     Systemic VTI:  0.15 m MV E velocity: 102.00 cm/s  Systemic Diam: 2.00 cm Candee Furbish MD Electronically signed by Candee Furbish MD Signature Date/Time: 12/11/2019/2:23:27 PM    Final      Subjective: Patient seen and examined bedside, resting comfortably.  Sitting at edge of bed.  States ready for discharge to rehab today.  No other specific complaints at this time.  Denies headache, no fever/chills/night sweats, no nausea/vomiting/diarrhea, no chest pain, no palpitations, no shortness of breath, no abdominal pain, no weakness, no fatigue, no paresthesias.  No acute events overnight per nursing staff.  Discharge Exam: Vitals:   12/14/19 1956 12/15/19 0706  BP: 109/63 102/62  Pulse: 69 65  Resp: 20 18  Temp: 98.8 F (37.1 C) 98.9 F (37.2 C)  SpO2: 96% 94%   Vitals:   12/14/19 1404 12/14/19 1956 12/15/19 0703 12/15/19 0706  BP: 98/70 109/63  102/62  Pulse: 70 69  65  Resp: 17 20  18   Temp: 98 F (36.7 C) 98.8 F (37.1 C)  98.9 F (37.2 C)  TempSrc:  Oral  Oral  SpO2: 99% 96%  94%  Weight:   58.4 kg   Height:        General: Pt is alert, awake, not in acute distress, thin in appearance Cardiovascular: RRR, S1/S2 +, no rubs, no gallops Respiratory: CTA bilaterally, no wheezing, no rhonchi, on 2 L nasal cannula oxygenating 94%. Abdominal: Soft, NT, ND, bowel sounds + Extremities: no edema, no cyanosis    The results of significant diagnostics from this hospitalization (including imaging, microbiology, ancillary and laboratory) are listed below for reference.     Microbiology: Recent Results (from the past 240 hour(s))  Respiratory Panel by RT PCR (Flu A&B, Covid) -  Nasopharyngeal Swab     Status: None   Collection Time: 12/10/19  7:23 PM   Specimen: Nasopharyngeal Swab  Result Value Ref Range Status   SARS Coronavirus 2 by RT PCR NEGATIVE NEGATIVE Final    Comment: (NOTE) SARS-CoV-2 target nucleic acids are NOT DETECTED. The SARS-CoV-2 RNA is generally detectable in upper respiratoy specimens during the acute phase of infection. The lowest concentration of SARS-CoV-2 viral copies this assay can detect is 131 copies/mL. A negative result does not preclude SARS-Cov-2 infection and should not be used as the sole basis for treatment or other patient management decisions. A negative result may occur with  improper specimen collection/handling, submission of specimen other than nasopharyngeal swab, presence of viral mutation(s) within the areas targeted by this assay, and inadequate number of viral copies (<131 copies/mL). A negative  result must be combined with clinical observations, patient history, and epidemiological information. The expected result is Negative. Fact Sheet for Patients:  PinkCheek.be Fact Sheet for Healthcare Providers:  GravelBags.it This test is not yet ap proved or cleared by the [place] FDA and  has been authorized for detection and/or diagnosis of SARS-CoV-2 by FDA under an Emergency Use Authorization (EUA). This EUA will remain  in effect (meaning this test can be used) for the duration of the COVID-19 declaration under Section 564(b)(1) of the Act, 21 U.S.C. section 360bbb-3(b)(1), unless the authorization is terminated or revoked sooner.    Influenza A by PCR NEGATIVE NEGATIVE Final   Influenza B by PCR NEGATIVE NEGATIVE Final    Comment: (NOTE) The Xpert Xpress SARS-CoV-2/FLU/RSV assay is intended as an aid in  the diagnosis of influenza from Nasopharyngeal swab specimens and  should not be used as a sole basis for treatment. Nasal washings and  aspirates  are unacceptable for Xpert Xpress SARS-CoV-2/FLU/RSV  testing. Fact Sheet for Patients: PinkCheek.be Fact Sheet for Healthcare Providers: GravelBags.it This test is not yet approved or cleared by the [place] FDA and  has been authorized for detection and/or diagnosis of SARS-CoV-2 by  FDA under an Emergency Use Authorization (EUA). This EUA will remain  in effect (meaning this test can be used) for the duration of the  Covid-19 declaration under Section 564(b)(1) of the Act, 21  U.S.C. section 360bbb-3(b)(1), unless the authorization is  terminated or revoked. Performed at [facility], [place] [address] [place], [place] [address]   Urine Culture     Status: Abnormal   Collection Time: 12/11/19 10:59 AM   Specimen: Urine, Random  Result Value Ref Range Status   Specimen Description   Final    URINE, RANDOM Performed at [facility] [address] [place], [place] [address]    Special Requests   Final    NONE Performed at [facility], [place] [address], [place], [place] [address]    Culture 20,000 COLONIES/mL PSEUDOMONAS AERUGINOSA (A)  Final   Report Status 12/13/2019 FINAL  Final   Organism ID, Bacteria PSEUDOMONAS AERUGINOSA (A)  Final      Susceptibility   Pseudomonas aeruginosa - MIC*    CEFTAZIDIME 4 SENSITIVE Sensitive     CIPROFLOXACIN <=0.25 SENSITIVE Sensitive     GENTAMICIN <=1 SENSITIVE Sensitive     IMIPENEM 2 SENSITIVE Sensitive     PIP/TAZO 8 SENSITIVE Sensitive     CEFEPIME 2 SENSITIVE Sensitive     * 20,000 COLONIES/mL PSEUDOMONAS AERUGINOSA     Labs: BNP (last 3 results) Recent Labs    08/18/19 1604 12/09/19 1907 12/10/19 1746  BNP 360.3* 648.2* [id number]   Basic Metabolic Panel: Recent Labs  Lab 12/09/19 1907 12/10/19 1754 12/11/19 0403 12/12/19 0439 12/13/19 0346 12/14/19 0355 12/15/19 0310  NA 134*   < > 138 137 136 134*  134*  K 4.1   < > 3.7 3.3* 4.0 3.7 3.4*  CL 95*   < > 100 98 98 95* 94*  CO2 29   < > 27 28 29 27 30   GLUCOSE 107*   < > 101* 101* 92 107* 100*  BUN 11   < > 13 14 15 17 18   CREATININE 0.69   < > 0.67 0.71 0.79 0.72 0.80  CALCIUM 8.0*   < > 7.5* 7.2* 7.4* 7.9* 8.1*  MG 2.4  --  2.0  --   --   --   --    < > =  values in this interval not displayed.   Liver Function Tests: Recent Labs  Lab 12/09/19 1907  AST 34  ALT 28  ALKPHOS 134*  BILITOT 1.8*  PROT 7.5  ALBUMIN 4.1   No results for input(s): LIPASE, AMYLASE in the last 168 hours. No results for input(s): AMMONIA in the last 168 hours. CBC: Recent Labs  Lab 12/09/19 1907 12/10/19 1754  WBC 13.0* 9.6  NEUTROABS 9.9* 6.5  HGB 13.9 12.2  HCT 42.2 36.2  MCV 94.6 94.8  PLT 209 186   Cardiac Enzymes: No results for input(s): CKTOTAL, CKMB, CKMBINDEX, TROPONINI in the last 168 hours. BNP: Invalid input(s): POCBNP CBG: No results for input(s): GLUCAP in the last 168 hours. D-Dimer No results for input(s): DDIMER in the last 72 hours. Hgb A1c No results for input(s): HGBA1C in the last 72 hours. Lipid Profile No results for input(s): CHOL, HDL, LDLCALC, TRIG, CHOLHDL, LDLDIRECT in the last 72 hours. Thyroid function studies No results for input(s): TSH, T4TOTAL, T3FREE, THYROIDAB in the last 72 hours.  Invalid input(s): FREET3 Anemia work up No results for input(s): VITAMINB12, FOLATE, FERRITIN, TIBC, IRON, RETICCTPCT in the last 72 hours. Urinalysis    Component Value Date/Time   COLORURINE STRAW (A) 12/09/2019 1907   APPEARANCEUR CLEAR 12/09/2019 1907   LABSPEC 1.002 (L) 12/09/2019 1907   PHURINE 8.0 12/09/2019 1907   GLUCOSEU NEGATIVE 12/09/2019 1907   GLUCOSEU NEGATIVE 06/01/2012 1228   HGBUR MODERATE (A) 12/09/2019 1907   BILIRUBINUR NEGATIVE 12/09/2019 1907   BILIRUBINUR neg 03/21/2013 1703   KETONESUR NEGATIVE 12/09/2019 1907   PROTEINUR NEGATIVE 12/09/2019 1907   UROBILINOGEN 0.2 11/10/2014 2144    NITRITE NEGATIVE 12/09/2019 1907   LEUKOCYTESUR LARGE (A) 12/09/2019 1907   Sepsis Labs Invalid input(s): PROCALCITONIN,  WBC,  LACTICIDVEN Microbiology Recent Results (from the past 240 hour(s))  Respiratory Panel by RT PCR (Flu A&B, Covid) - Nasopharyngeal Swab     Status: None   Collection Time: 12/10/19  7:23 PM   Specimen: Nasopharyngeal Swab  Result Value Ref Range Status   SARS Coronavirus 2 by RT PCR NEGATIVE NEGATIVE Final    Comment: (NOTE) SARS-CoV-2 target nucleic acids are NOT DETECTED. The SARS-CoV-2 RNA is generally detectable in upper respiratoy specimens during the acute phase of infection. The lowest concentration of SARS-CoV-2 viral copies this assay can detect is 131 copies/mL. A negative result does not preclude SARS-Cov-2 infection and should not be used as the sole basis for treatment or other patient management decisions. A negative result may occur with  improper specimen collection/handling, submission of specimen other than nasopharyngeal swab, presence of viral mutation(s) within the areas targeted by this assay, and inadequate number of viral copies (<131 copies/mL). A negative result must be combined with clinical observations, patient history, and epidemiological information. The expected result is Negative. Fact Sheet for Patients:  PinkCheek.be Fact Sheet for Healthcare Providers:  GravelBags.it This test is not yet ap proved or cleared by the [place] FDA and  has been authorized for detection and/or diagnosis of SARS-CoV-2 by FDA under an Emergency Use Authorization (EUA). This EUA will remain  in effect (meaning this test can be used) for the duration of the COVID-19 declaration under Section 564(b)(1) of the Act, 21 U.S.C. section 360bbb-3(b)(1), unless the authorization is terminated or revoked sooner.    Influenza A by PCR NEGATIVE NEGATIVE Final   Influenza B by PCR NEGATIVE  NEGATIVE Final    Comment: (NOTE) The Xpert Xpress SARS-CoV-2/FLU/RSV assay is  intended as an aid in  the diagnosis of influenza from Nasopharyngeal swab specimens and  should not be used as a sole basis for treatment. Nasal washings and  aspirates are unacceptable for Xpert Xpress SARS-CoV-2/FLU/RSV  testing. Fact Sheet for Patients: PinkCheek.be Fact Sheet for Healthcare Providers: GravelBags.it This test is not yet approved or cleared by the [place] FDA and  has been authorized for detection and/or diagnosis of SARS-CoV-2 by  FDA under an Emergency Use Authorization (EUA). This EUA will remain  in effect (meaning this test can be used) for the duration of the  Covid-19 declaration under Section 564(b)(1) of the Act, 21  U.S.C. section 360bbb-3(b)(1), unless the authorization is  terminated or revoked. Performed at [facility], [place] [address], [place], [place] [address]   Urine Culture     Status: Abnormal   Collection Time: 12/11/19 10:59 AM   Specimen: Urine, Random  Result Value Ref Range Status   Specimen Description   Final    URINE, RANDOM Performed at [facility] [address], [place], [place] [address]    Special Requests   Final    NONE Performed at [facility], [place] [address], [place], [place] [address]    Culture 20,000 COLONIES/mL PSEUDOMONAS AERUGINOSA (A)  Final   Report Status 12/13/2019 FINAL  Final   Organism ID, Bacteria PSEUDOMONAS AERUGINOSA (A)  Final      Susceptibility   Pseudomonas aeruginosa - MIC*    CEFTAZIDIME 4 SENSITIVE Sensitive     CIPROFLOXACIN <=0.25 SENSITIVE Sensitive     GENTAMICIN <=1 SENSITIVE Sensitive     IMIPENEM 2 SENSITIVE Sensitive     PIP/TAZO 8 SENSITIVE Sensitive     CEFEPIME 2 SENSITIVE Sensitive     * 20,000 COLONIES/mL PSEUDOMONAS AERUGINOSA     Time coordinating discharge: Over 30  minutes  SIGNED:   Khalen Styer J [place] DO  [facility] 12/15/2019, 12:24 PM

## 2019-12-24 ENCOUNTER — Telehealth: Payer: Self-pay | Admitting: Cardiovascular Disease

## 2019-12-24 NOTE — Telephone Encounter (Signed)
Patient states that she just got out of [facility] yesterday. She felt she needed to let Dr. Oval Linsey know so she could know the next steps. Please advise.

## 2019-12-24 NOTE — Telephone Encounter (Signed)
Returned call to patient she stated she was admitted to [facility] recently with heart failure.She was calling to schedule  appointment.Stated she is feeling better.Post hosp appointment scheduled with Roby Lofts PA 5/12 at 2:15 pm.

## 2019-12-27 ENCOUNTER — Other Ambulatory Visit: Payer: Self-pay | Admitting: *Deleted

## 2019-12-27 ENCOUNTER — Telehealth: Payer: Self-pay | Admitting: Cardiovascular Disease

## 2019-12-27 NOTE — Telephone Encounter (Signed)
Patient has had a 2lb weight gain in one day. This is not unusual for her. She takes furosemide (LASIX) 40 MG tablet two time a day.  Her BP 184/60, she not having any other symptoms, no swelling. She does have an upcoming appt on Wednesday.  She wants to know what she should do if she has an additional 2lb weight gain tomorrow.

## 2019-12-27 NOTE — Patient Outreach (Signed)
[facility]) Care Management  12/27/2019  Kristen Whitehead 1922-11-06 [id number]   [place] Discipline Closure  Referral Date:03/09/2018 Referral Source:Transfer from [place] Reason for Referral:Continued Disease Management Education Insurance:[employer] Medicare   Outreach Attempt:  Recent discharge from [facility] for heart failure.  Discharged to [facility] for rehabilitation.  Successful telephone outreach to patient for routine follow up.  HIPAA verified with patient.  Patient reporting she returned home from [place] on Thursday afternoon.  States her son stayed with her overnight.  Has appointment scheduled with her primary care provider on 12/28/2019.  Reports she has all her medications at this time.  [facility] services reviewed and patient agrees to [place] Coordinator referral for [facility] calls and closer follow up.  Plan:  RN Health Coach will close Disease Management Case.  RN Health Coach will send primary care provider Discipline Closure Letter.  RN Health Coach will place [place] Coordinator referral for [facility] follow up.  [place] [phone] [email or web address]

## 2019-12-27 NOTE — Telephone Encounter (Signed)
Patient reports she gained 2lbs since yesterday. She reports she will fluctuate like this, may take a few days to lose weight back to normal. Prior to most recent hospitalization, she was taking lasix 40mg  QAM and 20mg  QPM (with extra 20mg  if needed based on weight gain). Since discharge, she is on lasix 40mg  BID. She denies LE edema. She reports her breathing is stable, no better/no worse but reports low O2 in the 70s-80s, stating she was never told this was not good. She has made a call to her PCP office about this. She states she did not have low O2 while at [place] [facility]. Her BP was low today per [facility] nurse 92/60s. Patient states her O2 has been going lower when her BP is low.    Provided heart failure guidelines of calling if she gains 3lbs in 24 hours or 5lbs in one week, and that it would be OK to take extra 20mg  of lasix if she falls in those parameters.   She has f/u with Daleen Snook PA on 5/12 - advised she bring log of weights and BP to this appointment. Her son Elta [place] will be coming as well.

## 2019-12-28 ENCOUNTER — Encounter: Payer: Self-pay | Admitting: Cardiovascular Disease

## 2019-12-28 ENCOUNTER — Other Ambulatory Visit: Payer: Self-pay | Admitting: *Deleted

## 2019-12-28 NOTE — Patient Outreach (Signed)
Kristen Whitehead) Care Management  12/28/2019  Kristen Kristen Whitehead 1923/02/02 [id number]  Received referral 5/10 post d/c from SNF Transition of care-enrolled-CHF  RN spoke with pt and introduced [facility] services and the purpose for today's call. Pt explained all her upcoming appointments with her primary provider scheduled today and CAD provider on 5/12. States the [facility] NP visited yesterday who arranged her appointment for primary provider today. RN inquired on her HF as pt explains her ongoing issues with some history of swelling to her LE but not a lot indicating she elevates her legs every evening while in bed. States she maybe obtaining  home O2 by her provider  today due to her low sats but denies any syncopal episodes or related issues. Pt reports her weighs with her last readings of 127-128 lbs with no acute issues over the last few days. Stats she documents all her readings for her providers to view. RN further offered to enroll pt into the [facility] program and services to further educate and assist pt with her ongoing monitoring. Pt receptive to weekly contacts to start (transition of care calls) and willing to participate with the discussed plan of care related to the goals fr CHF. Pt denies any swelling, SOB and related symptoms at this time and willing to continue her daily monitoring and weights. RN offered to send printable information however pt opt to decline this material indicating she is aware. Pt states she is really never in the GREEN zone long but this is her history. Denies any breathing problems with her occasional low saturation. States she lives alone but has neighbors and her daughter Kristen Whitehead) who is wheelchair bound checks on her daily.   RN further offered to completed an initial assessment over the next 2 weeks as pt receptive with RN obtaining additional information on the next follow up calls. Plan of care discussed related to her CHF. RN will also alert pt's  provider of pt's disposition with [facility].  Plan: Will follow up next week for pt's ongoing management of care related to the plan of care discussed today.  [facility] CM Care Plan Problem One     Most Recent Value  Care Plan Problem One  Deficient Knowledge related to CHF symptom management  Role Documenting the Problem One  Care Management Telephonic Coordinator  Care Plan for Problem One  Active  [facility] Long Term Goal   Pt will verbalize two signs and symptoms of CHF exacerbation that should be reported to provided in the next 90 days.  [facility] Long Term Goal Start Date  12/28/19  Interventions for Problem One Long Term Goal  Will discuss HF zones and acute symptoms that should be reported to her provider for early interventions. Will stress the importance of daily weights and fluid retention and what to do if acute. Will offer to sent printable information on CHF action zones for ongoing knowledge base.  [facility] CM Short Term Goal #1   Pt will weigh self daily and record for providers to view in journal within the next 30 days.  [facility] CM Short Term Goal #1 Start Date  12/28/19  Interventions for Short Term Goal #1  Will stress the importance of daily monitoring of her CHF to prevent acute symptoms from occurring. Stress the importance of early interventions for a faster recovery to avoid acute symptoms from occurring. Will offer any tools to assist with this task.  [facility] CM Short Term Goal #2   Pt will be adherent to  all medical appointments post d/c over the next 30 days.  [facility] CM Short Term Goal #2 Start Date  12/28/19  Interventions for Short Term Goal #2  Will stress the importance of pt attending all medical appointments to avoid acute problems from occurring. Will verify pt has sufficient transportation to all appointments and offer an alternate source of transportation if needed.      Kristen Mina, RN Care Management Coordinator [place] Office [phone]

## 2019-12-29 ENCOUNTER — Other Ambulatory Visit: Payer: Self-pay

## 2019-12-29 ENCOUNTER — Encounter: Payer: Self-pay | Admitting: Medical

## 2019-12-29 ENCOUNTER — Ambulatory Visit: Payer: Medicare Other | Admitting: Medical

## 2019-12-29 VITALS — BP 118/68 | HR 67 | Temp 97.2°F | Ht 60.0 in | Wt 131.0 lb

## 2019-12-29 DIAGNOSIS — I10 Essential (primary) hypertension: Secondary | ICD-10-CM

## 2019-12-29 DIAGNOSIS — I482 Chronic atrial fibrillation, unspecified: Secondary | ICD-10-CM | POA: Diagnosis not present

## 2019-12-29 MED ORDER — IRBESARTAN 150 MG PO TABS: 150.0000 mg | ORAL_TABLET | Freq: Every day | ORAL | 3 refills | Status: DC

## 2019-12-29 NOTE — Progress Notes (Signed)
Cardiology Office Note   Date:  12/29/2019   ID:  Kristen, Whitehead 1923/06/19, MRN [id number]  PCP:  Leighton Ruff, MD  Cardiologist:  [name], MD EP: None  Chief Complaint  Patient presents with  . Hospitalization Follow-up    CHF      History of Present Illness: Kristen Whitehead is a 86 y.o. female with PMH of chornic diastolic CHF, chronic atrial fibrillation, HTN, and HLD, who presents for hospitalization follow-up.  She was last evaluated by cardiology at an outpatient visit with Dr. Oval Linsey 10/18/19, at which time she had continued to have mild LE edema, in addition to balance/strength issues. She was recommended to take an addition 20mg  lasix as needed, though patient was hesitant due to frequent urination.   She was recently admitted to the hospital from 12/10/19-12/15/19 for acute respiratory failure, felt to be 2/2 acute on chronic diastolic CHF. She was diuresed with IV lasix and discharge home on lasix 40g BID. Echo that admission showed EF 70-75%, indeterminate LV diastolic function, no RWMA, mild biatrial enlargement, and no significant valvular abnormalities. Her last ischemic evaluation was a NST in 2013 which was reportedly without ischemia.   She presents today for post-hospital follow-up. She contacted our office 12/27/19 to report weight gain of 2 lbs, O2 sats in the 70s-80s, and BP in the 90s/60s.   She presents today with her son and has multiple concerns. She is working with her PCP to get home O2 due to concerns for hypoxia with O2 sats in the 70s and 80s at home. She reports feeling "swimmy headed" at that time. O2 sat was 93% on RA at rest today and suprisingly improved to 94% with ambulation. Encouraged her to continue to talk with her PCP to coordinate home O2. We discussed the the nature of her heart failure and importance of limiting salt intake to <2 grams and fluid intake to <2L per day. She reports eating mostly microwave meals since  she lives alone and doesn't like to cook. She also likely drinks more liquids than recommended. She thinks she missed an afternoon dose of her lasix once this past week when she had a doctors appointment but otherwise has been compliant. She's had improvement in her LE edema since discharge and reports stable chronic DOE. We reviewed her home BP log and she has some low blood pressures with SBP in the 100s. She has occasional dizziness, but thankfully no syncope or falls. She has no complaints of chest pain, SOB at rest, orthopnea, PND, or tachypalpitations.     Past Medical History:  Diagnosis Date  . Bladder prolapse, female, acquired   . Cancer ([place])    thyroid  . Chest pain    no known ischemic heart disease; negative Myoview July 2013. EF 75% with no ischemia.   . Chronic anticoagulation   . Chronic atrial fibrillation (HCC)    managed with rate control and coumadin  . Chronic diastolic heart failure ([place])   . GERD (gastroesophageal reflux disease)   . Heart disease   . History of congenital mitral regurgitation    mild  . History of mitral valve prolapse 06/15/2008   a. echo 1/14: mild LVH, EF 60-65%, mild MR, mild to mod BAE, PASP 35  . Hypercholesterolemia   . Hypertension   . Hypothyroidism     Past Surgical History:  Procedure Laterality Date  . ABDOMINAL HYSTERECTOMY    . CATARACT EXTRACTION    . CHOLECYSTECTOMY    .  Thyroidectomy       Current Outpatient Medications  Medication Sig Dispense Refill  . amLODipine (NORVASC) 2.5 MG tablet Take 2 tablets (5 mg total) by mouth daily. 60 tablet 11  . apixaban (ELIQUIS) 2.5 MG TABS tablet Take 1 tablet (2.5 mg total) by mouth 2 (two) times daily. 180 tablet 3  . apixaban (ELIQUIS) 5 MG TABS tablet Eliquis 5 mg tablet  TAKE ONE TABLET (5mg ) BY MOUTH TWICE DAILY    . atenolol (TENORMIN) 50 MG tablet Take 1 tablet (50 mg total) by mouth 2 (two) times daily. 180 tablet 3  . benzonatate (TESSALON) 100 MG capsule Take 1  capsule (100 mg total) by mouth every 8 (eight) hours as needed for cough. 21 capsule 0  . Calcium Carbonate-Vitamin D (CALCIUM 600+D PO) Take 1 tablet by mouth 2 (two) times daily.    . Cholecalciferol (VITAMIN D) 2000 UNITS tablet Take 4,000 Units by mouth daily.     . dorzolamide (TRUSOPT) 2 % ophthalmic solution Place 1 drop into the left eye 2 (two) times daily.    . furosemide (LASIX) 40 MG tablet Take 1 tablet (40 mg total) by mouth 2 (two) times daily. 60 tablet 0  . irbesartan (AVAPRO) 150 MG tablet Take 1 tablet (150 mg total) by mouth daily. 90 tablet 3  . isosorbide mononitrate (IMDUR) 30 MG 24 hr tablet Take 1 tablet (30 mg total) by mouth daily. 90 tablet 1  . latanoprost (XALATAN) 0.005 % ophthalmic solution Place 1 drop into both eyes at bedtime.     Marland Kitchen levothyroxine (SYNTHROID, LEVOTHROID) 100 MCG tablet Take 100 mcg by mouth daily.     Marland Kitchen loratadine (CLARITIN) 10 MG tablet Take 1 tablet (10 mg total) by mouth daily. 30 tablet 0  . MAGNESIUM OXIDE 400 PO Take 800 mg by mouth daily.    . Multiple Vitamins-Minerals (CENTRUM SILVER PO) Take 1 tablet by mouth daily.    . Multiple Vitamins-Minerals (PRESERVISION AREDS 2 PO) Take 1 capsule by mouth 2 (two) times daily.     . nitroGLYCERIN (NITROSTAT) 0.4 MG SL tablet DISSOLVE 1 TABLET UNDER THE TONGUE EVERY 5 MINUTES FOR 3 DOSES AS NEEDED  FOR  CHEST  PAIN 50 tablet 2  . omeprazole (PRILOSEC) 40 MG capsule Take 1 capsule (40 mg total) by mouth daily. 90 capsule 1  . potassium chloride (KLOR-CON) 10 MEQ tablet Take 1 tablet (10 mEq total) by mouth daily. 90 tablet 1  . pramipexole (MIRAPEX) 0.25 MG tablet Take 1 tablet (0.25 mg total) by mouth 2 (two) times daily. 60 tablet 0  . PREMARIN vaginal cream Place 2 g vaginally once a week. Uses on Sunday and wednesday    . Probiotic Product (PROBIOTIC DAILY PO) Take 1 tablet by mouth daily.    . sodium chloride (MURO 128) 5 % ophthalmic solution Place 1 drop into the right eye 3 (three) times  daily.    . temazepam (RESTORIL) 15 MG capsule Take 1 capsule (15 mg total) by mouth at bedtime. 30 capsule 0   No current facility-administered medications for this visit.    Allergies:   Azithromycin, Compazine [prochlorperazine edisylate], Demerol, Dolophine [methadone], Ebastine, Erythromycin, Ibuprofen, Statins, Tetanus toxoids, and Tetracyclines & related    Social History:  The patient  reports that she has never smoked. She has never used smokeless tobacco. She reports that she does not drink alcohol or use drugs.   Family History:  The patient's family history includes Stroke in her  father and mother.    ROS:  Please see the history of present illness.   Otherwise, review of systems are positive for none.   All other systems are reviewed and negative.    PHYSICAL EXAM: VS:  BP 118/68   Pulse 67   Temp (!) 97.2 F (36.2 C)   Ht 5' (1.524 m)   Wt 131 lb (59.4 kg)   SpO2 93%   BMI 25.58 kg/m  , BMI Body mass index is 25.58 kg/m. GEN: Well nourished, well developed, in no acute distress HEENT: sclera anicteric Neck: no JVD, carotid bruits, or masses Cardiac: RRR; no murmurs, rubs, or gallops, trace edema  Respiratory:  clear to auscultation bilaterally, normal work of breathing GI: soft, nontender, nondistended, + BS MS: no deformity or atrophy Skin: warm and dry, no rash Neuro:  Strength and sensation are intact Psych: euthymic mood, full affect   EKG:  EKG is ordered today. The ekg ordered today demonstrates atrial fibrillation with rate 67 bpm, LAFB, new TWI in III and aVF, no STE/D   Recent Labs: 01/05/2019: NT-Pro BNP 2,323 12/09/2019: ALT 28 12/10/2019: B Natriuretic Peptide 511.4; Hemoglobin 12.2; Platelets 186 12/11/2019: Magnesium 2.0 12/15/2019: BUN 18; Creatinine, Ser 0.80; Potassium 3.4; Sodium 134    Lipid Panel    Component Value Date/Time   CHOL 211 (H) 11/04/2012 1524   TRIG 201.0 (H) 11/04/2012 1524   HDL 66.80 11/04/2012 1524   CHOLHDL 3  11/04/2012 1524   VLDL 40.2 (H) 11/04/2012 1524   LDLCALC 109 (H) 04/16/2012 1043   LDLDIRECT 98.8 11/04/2012 1524      Wt Readings from Last 3 Encounters:  12/29/19 131 lb (59.4 kg)  12/15/19 128 lb 11.2 oz (58.4 kg)  10/18/19 132 lb (59.9 kg)      Other studies Reviewed: Additional studies/ records that were reviewed today include:  Echocardiogram 11/2019: 1. Left ventricular ejection fraction, by estimation, is 70 to 75%. The  left ventricle has hyperdynamic function. The left ventricle has no  regional wall motion abnormalities. Left ventricular diastolic parameters  are indeterminate.  2. Right ventricular systolic function is normal. The right ventricular  size is normal.  3. Left atrial size was mildly dilated.  4. Right atrial size was mildly dilated.  5. The mitral valve is normal in structure. Trivial mitral valve  regurgitation. No evidence of mitral stenosis.  6. The aortic valve is tricuspid. Aortic valve regurgitation is trivial.  Mild to moderate aortic valve sclerosis/calcification is present, without  any evidence of aortic stenosis.  7. The inferior vena cava is dilated in size with <50% respiratory  variability, suggesting right atrial pressure of 15 mmHg.   Comparison(s): No significant change from prior study. Prior images  reviewed side by side.     ASSESSMENT AND PLAN:  1. Chronic diastolic CHF: volume status appears stable.  - Continue lasix 40mg  BID - Encouraged low sodium diet and limiting fluid intake to <2L per day  2. Chronic atrial fibrillation: EKG with rate controlled atrial fibrillation today.  - Continue atenolol for rate control - Continue eliquis 2.5mg  BID for stroke ppx (does adjusted for age and wt <60kg)   3. HTN: BP 118/68 today. She has had some soft blood pressures at home recently. - Will decrease irbesartan to 150mg  daily - Continue amlodpine, atenolol, imdur, and lasix  4. HLD: LDL 68 in 2019; intolerant to  statins - Continue dietary modifications to lower cholesterol  5. EKG changes: she has new TWI  in inferior leads on EKG today. Surprisingly there are no EKGs from her recent hospitalization, so last comparison was 07/2019. No significant chest pain episodes to suggest ischemic etiology.  - Will continue to monitor symptoms at this time.  - Could consider an ischemic evaluation if she experiences chest pain going forward given good functional status.    Current medicines are reviewed at length with the patient today.  The patient does not have concerns regarding medicines.  The following changes have been made:  As above  Labs/ tests ordered today include:   Orders Placed This Encounter  Procedures  . EKG 12-Lead     Disposition:   FU with Dr. Oval Linsey in 3 months  Signed, Abigail Butts, PA-C  12/29/2019 11:12 PM

## 2019-12-29 NOTE — Patient Instructions (Addendum)
Medication Instructions:   DECREASE Irbesartan to 150 mg daily  CONTINUE Lasix as prescribed  *If you need a refill on your cardiac medications before your next appointment, please call your pharmacy*  Lab Work: NONE ordered at this time of appointment   If you have labs (blood work) drawn today and your tests are completely normal, you will receive your results only by: Marland Kitchen MyChart Message (if you have MyChart) OR . A paper copy in the mail If you have any lab test that is abnormal or we need to change your treatment, we will call you to review the results.  Testing/Procedures: NONE ordered at this time of appointment   Follow-Up: At [facility], you and your health needs are our priority.  As part of our continuing mission to provide you with exceptional heart care, we have created designated Provider Care Teams.  These Care Teams include your primary Cardiologist (physician) and Advanced Practice Providers (APPs -  Physician Assistants and Nurse Practitioners) who all work together to provide you with the care you need, when you need it.   Your next appointment:   3 month(s)  The format for your next appointment:   In Person  Provider:   [name], MD  Other Instructions  CONTINUE to monitor weight at home. If you have a weight gain of 3 lbs overnight or 5 lbs in a week  LIMIT fluid intake to 8-8oz cups of fluid per day  LIMIT salt intake to 2 grams of salt per day.

## 2020-01-05 NOTE — Telephone Encounter (Signed)
Patient had visit 5/12

## 2020-01-06 ENCOUNTER — Encounter: Payer: Self-pay | Admitting: *Deleted

## 2020-01-06 ENCOUNTER — Other Ambulatory Visit: Payer: Self-pay | Admitting: *Deleted

## 2020-01-06 NOTE — Patient Outreach (Signed)
[name]  01/06/2020  Kristen Whitehead 08/19/1923 [id number]   Provider office to completed transition of care call Dr. Drema Dallas  Telephone Assessment Initial Assessment Completed  RN spoke with pt today and received information to completed the initial assessment. Pt reports she still drives to all her medical appointments and has family if needed. Reports weights yesterday 126 lbs, today 129 lbs and last week 127 lbs with no swelling or fluid retention noted.  Reports [facility] nurse remains involved with weekly home visits. Updated plan of care with goals discussed once again and interventions adjusted according. Pt is doing well and continue adherence in managing her HF. Pt confirms receiving the information packet via [facility]. RN strongly encouraged pt to review the information with her caregivers and utilize the calendar for documenting all her readings for her providers to view. No other request or inquires at this time. Will continue to encourage adherence and follow up in a few weeks with pt's ongoing adherence related to the discussed plan of care.   [facility] CM Care Plan Problem One     Most Recent Value  Care Plan Problem One  Deficient Knowledge related to CHF symptom management  Role Documenting the Problem One  Care Management Telephonic Coordinator  Care Plan for Problem One  Active  [facility] Long Term Goal   Pt will verbalize two signs and symptoms of CHF exacerbation that should be reported to provided in the next 90 days.  [facility] Long Term Goal Start Date  12/28/19  Interventions for Problem One Long Term Goal  Will review this information and verify pt has a good understanding of what to do if acute.  [facility] CM Short Term Goal #1   Pt will weigh self daily and record for providers to view in journal within the next 30 days.  [facility] CM Short Term Goal #1 Start Date  12/28/19  Interventions for Short Term Goal #1  Will extend to allow pt ongoing adherence with  her daily weights. Will continue to stress the iimportance of fuild retention related to HF symptoms.  [facility] CM Short Term Goal #2   Pt will be adherent to all medical appointments post d/c over the next 30 days.  [facility] CM Short Term Goal #2 Start Date  12/28/19  Interventions for Short Term Goal #2  Will extend to allow ongoing adherence with all medical appointments. Verified pt has ongoing transportation sources.      Raina Mina, RN Care Management Coordinator [place] Office [phone]

## 2020-01-13 ENCOUNTER — Telehealth: Payer: Self-pay | Admitting: Cardiovascular Disease

## 2020-01-13 NOTE — Telephone Encounter (Signed)
Pt c/o swelling: STAT is pt has developed SOB within 24 hours  1) How much weight have you gained and in what time span? Hasn't gained any weight in the past 3 days  2) If swelling, where is the swelling located? Feet & Legs  3) Are you currently taking a fluid pill? Yes   4) Are you currently SOB? No   5) Do you have a log of your daily weights (if so, list)?  05/27 126.6 05/26 128.4 05/25 127.8 05/24 129.0 05/23 129  6) Have you gained 3 pounds in a day or 5 pounds in a week? Doesn't think so   7) Have you traveled recently? No  Kristen Whitehead is calling stating she has been having excessive swelling in her legs and feet. She states she's increased the medication as Dr. Harrell Gave advised and it did not help with the swelling at all. [place] would like to know if any other medication adjustment can be made to help with this. Even after sleeping all night with her feet elevated they are still swollen the same when she wakes up. Please advise.

## 2020-01-13 NOTE — Telephone Encounter (Signed)
Called patient back about her swelling in BLE. Patient stated she cannot wear her shoes or support hoes due to the swelling. Patient stated she has been gaining weight since she has been home from the hospital and she hardly eats anything. Patient also complaining of her oxygen level, which she states is in the 80's, but is normal when she sees a provider. Patient's BP have been fine, 125/66, 113/67, 134/72, 114/62 . Patient state her HR has been in the 50's today, but it is usually not that low. Patient stated she has been keeping a low salt diet, watching her fluid intake, and elevating her legs. Will forward to Dr. Oval Linsey,  Daleen Snook PA, and her nurse for further advisement.

## 2020-01-14 NOTE — Telephone Encounter (Addendum)
Wt 128 lb yesterday, today 126 lbs  Usually weighs around 124 No change in diet  Patient will increase Lasix to 60 mg in am and 40 mg afternoon  Will increase Potassium to 10 meq BID while on this dose  Wants to see how she does on this does for a few days and call back Tuesday with update

## 2020-01-14 NOTE — Telephone Encounter (Signed)
Thanks for the update - can you get a sense of what her weights have been over the past several days? Has she been compliant with her lasix 40mg  BID?  If weights have been uptrending and swelling in legs have been worse despite taking lasix, she can increase her lasix to 60mg  in the morning and 40 in the evening with plans to increase her potassium to 10 mEq BID. Looks like I have some openings on Monday - can you schedule her to see me for close follow-up?  Thanks for your help!

## 2020-01-14 NOTE — Addendum Note (Signed)
Addended by: Alvina Filbert B on: 01/14/2020 07:05 PM   Modules accepted: Orders

## 2020-01-14 NOTE — Telephone Encounter (Signed)
Tried to call patient, no vm

## 2020-01-18 ENCOUNTER — Encounter: Payer: Self-pay | Admitting: Cardiovascular Disease

## 2020-01-18 NOTE — Telephone Encounter (Signed)
Spoke to patient she stated she was calling [name] back to report she went back to normal furosemide dose 40 mg twice a day.Stated swelling in feet are down.She has lost 2 lbs over the weekend.Advised I will send message to [place].

## 2020-01-18 NOTE — Telephone Encounter (Signed)
Patients swelling improved but she took whole tablet x 1 day  She will try to increase to 60 mg in am and 40 mg afternoon and keep follow up as scheduled

## 2020-01-18 NOTE — Telephone Encounter (Signed)
This encounter was created in error - please disregard.

## 2020-01-18 NOTE — Telephone Encounter (Signed)
New Message    Pt c/o medication issue:  1. Name of Medication: furosemide (LASIX) 40 MG tablet(Expired)  2. How are you currently taking this medication (dosage and times per day)? 2 tablets in the morning and 1 in afternoon   3. Are you having a reaction (difficulty breathing--STAT)? No   4. What is your medication issue? Pt is calling with an update since changing her medication. She says she has not had any problems and her swelling in her feet have gone down. She is frequently using the bathroom   Please call back

## 2020-01-19 ENCOUNTER — Other Ambulatory Visit: Payer: Self-pay | Admitting: Cardiovascular Disease

## 2020-01-24 ENCOUNTER — Telehealth (INDEPENDENT_AMBULATORY_CARE_PROVIDER_SITE_OTHER): Payer: Medicare Other | Admitting: Medical

## 2020-01-24 ENCOUNTER — Encounter: Payer: Self-pay | Admitting: Medical

## 2020-01-24 VITALS — BP 132/67 | HR 71 | Ht 60.0 in | Wt 127.8 lb

## 2020-01-24 DIAGNOSIS — R05 Cough: Secondary | ICD-10-CM

## 2020-01-24 DIAGNOSIS — I10 Essential (primary) hypertension: Secondary | ICD-10-CM

## 2020-01-24 DIAGNOSIS — E7800 Pure hypercholesterolemia, unspecified: Secondary | ICD-10-CM | POA: Diagnosis not present

## 2020-01-24 DIAGNOSIS — R059 Cough, unspecified: Secondary | ICD-10-CM

## 2020-01-24 DIAGNOSIS — I5032 Chronic diastolic (congestive) heart failure: Secondary | ICD-10-CM | POA: Diagnosis not present

## 2020-01-24 DIAGNOSIS — I482 Chronic atrial fibrillation, unspecified: Secondary | ICD-10-CM

## 2020-01-24 DIAGNOSIS — R0602 Shortness of breath: Secondary | ICD-10-CM

## 2020-01-24 NOTE — Progress Notes (Deleted)
Cardiology Office Note   Date:  01/24/2020   ID:  Kristen Whitehead, Kristen Whitehead March 30, 1923, MRN [id number]  PCP:  Leighton Ruff, MD  Cardiologist:  [name], MD EP: None  No chief complaint on file.     History of Present Illness: Kristen Whitehead is a 86 y.o. female with PMH of chornic diastolic CHF, chronic atrial fibrillation, HTN, and HLD, who presents for ***  She was last evaluated by cardiology at an outpatient visit with myself 12/29/19 at which time she had multiple concerns. She was working with her PCP to get home O2 due to concerns for hypoxia with O2 sats in the 70s and 80s at home. She reported feeling "swimmy headed" at that time. O2 sat was 93% on RA at rest today and suprisingly improved to 94% with ambulation. Encouraged her to continue to talk with her PCP to coordinate home O2. We discussed the the nature of her heart failure and importance of limiting salt intake to <2 grams and fluid intake to <2L per day. She reported eating mostly microwave meals since she lives alone and doesn't like to cook. She also likely drinks more liquids than recommended. She's had improvement in her LE edema since discharge and reports stable chronic DOE. We reviewed her home BP log and she has some low blood pressures with SBP in the 100s. She has occasional dizziness, but thankfully no syncope or falls. She has no complaints of chest pain, SOB at rest, orthopnea, PND, or tachypalpitations. Echo 11/2019 showed EF 70-75%, indeterminate LV diastolic function, no RWMA, mild biatrial enlargement, and no significant valvular abnormalities. Her last ischemic evaluation was a NST in 2013 which was reportedly without ischemia.  She contacted our office 01/13/20 to report worsening LE edema and O2 sats in the 80s. She was recommended to increase her lasix to 60mg  in the morning and 40mg  in the evening.   She presents today for close follow-up.   1. Chronic diastolic CHF: volume status appears  stable***.  - Continue lasix 40mg  BID - Encouraged low sodium diet and limiting fluid intake to <2L per day  2. Chronic atrial fibrillation:  - Continue atenolol for rate control - Continue eliquis 2.5mg  BID for stroke ppx (does adjusted for age and wt <60kg)   3. HTN: BP *** today.  - Continue amlodpine, atenolol, imdur, irbesartan, and lasix  4. HLD: LDL 68 in 2019; intolerant to statins - Continue dietary modifications to lower cholesterol  5. EKG changes: she has new TWI in inferior leads on EKG today. Surprisingly there are no EKGs from her recent hospitalization, so last comparison was 07/2019. No significant chest pain episodes to suggest ischemic etiology.  - Will continue to monitor symptoms at this time.  - Could consider an ischemic evaluation if she experiences chest pain going forward given good functional status.    Past Medical History:  Diagnosis Date  . Bladder prolapse, female, acquired   . Cancer ([facility])    thyroid  . Chest pain    no known ischemic heart disease; negative Myoview July 2013. EF 75% with no ischemia.   . Chronic anticoagulation   . Chronic atrial fibrillation ([facility])    managed with rate control and coumadin  . Chronic diastolic heart failure ([place])   . GERD (gastroesophageal reflux disease)   . Heart disease   . History of congenital mitral regurgitation    mild  . History of mitral valve prolapse 06/15/2008   a. echo 1/14:  mild LVH, EF 60-65%, mild MR, mild to mod BAE, PASP 35  . Hypercholesterolemia   . Hypertension   . Hypothyroidism     Past Surgical History:  Procedure Laterality Date  . ABDOMINAL HYSTERECTOMY    . CATARACT EXTRACTION    . CHOLECYSTECTOMY    . Thyroidectomy       Current Outpatient Medications  Medication Sig Dispense Refill  . amLODipine (NORVASC) 2.5 MG tablet Take 2 tablets (5 mg total) by mouth daily. 60 tablet 11  . apixaban (ELIQUIS) 2.5 MG TABS tablet Take 1 tablet (2.5 mg total) by mouth 2 (two)  times daily. 180 tablet 3  . atenolol (TENORMIN) 50 MG tablet Take 1 tablet (50 mg total) by mouth 2 (two) times daily. 180 tablet 3  . benzonatate (TESSALON) 100 MG capsule Take 1 capsule (100 mg total) by mouth every 8 (eight) hours as needed for cough. 21 capsule 0  . Calcium Carbonate-Vitamin D (CALCIUM 600+D PO) Take 1 tablet by mouth 2 (two) times daily.    . Cholecalciferol (VITAMIN D) 2000 UNITS tablet Take 4,000 Units by mouth daily.     . dorzolamide (TRUSOPT) 2 % ophthalmic solution Place 1 drop into the left eye 2 (two) times daily.    . furosemide (LASIX) 40 MG tablet Take 1 tablet (40 mg total) by mouth 2 (two) times daily. 60 tablet 0  . irbesartan (AVAPRO) 150 MG tablet Take 1 tablet (150 mg total) by mouth daily. 90 tablet 3  . isosorbide mononitrate (IMDUR) 30 MG 24 hr tablet Take 1 tablet (30 mg total) by mouth daily. 90 tablet 1  . latanoprost (XALATAN) 0.005 % ophthalmic solution Place 1 drop into both eyes at bedtime.     Marland Kitchen levothyroxine (SYNTHROID, LEVOTHROID) 100 MCG tablet Take 100 mcg by mouth daily.     Marland Kitchen loratadine (CLARITIN) 10 MG tablet Take 1 tablet (10 mg total) by mouth daily. (Patient not taking: Reported on 01/06/2020) 30 tablet 0  . MAGNESIUM OXIDE 400 PO Take 800 mg by mouth daily.    . Multiple Vitamins-Minerals (CENTRUM SILVER PO) Take 1 tablet by mouth daily.    . Multiple Vitamins-Minerals (PRESERVISION AREDS 2 PO) Take 1 capsule by mouth 2 (two) times daily.     . nitroGLYCERIN (NITROSTAT) 0.4 MG SL tablet DISSOLVE 1 TABLET UNDER THE TONGUE EVERY 5 MINUTES FOR 3 DOSES AS NEEDED  FOR  CHEST  PAIN 50 tablet 2  . omeprazole (PRILOSEC) 40 MG capsule Take 1 capsule (40 mg total) by mouth daily. (Patient not taking: Reported on 01/06/2020) 90 capsule 1  . potassium chloride (KLOR-CON) 10 MEQ tablet Take 1 tablet (10 mEq total) by mouth daily. 90 tablet 1  . potassium chloride (KLOR-CON) 10 MEQ tablet TAKE TWO TABLETS BY MOUTH EVERY DAY 180 tablet 0  . pramipexole  (MIRAPEX) 0.25 MG tablet Take 1 tablet (0.25 mg total) by mouth 2 (two) times daily. 60 tablet 0  . PREMARIN vaginal cream Place 2 g vaginally once a week. Uses on Sunday and wednesday    . Probiotic Product (PROBIOTIC DAILY PO) Take 1 tablet by mouth daily.    . sodium chloride (MURO 128) 5 % ophthalmic solution Place 1 drop into the right eye 3 (three) times daily.    . temazepam (RESTORIL) 15 MG capsule Take 1 capsule (15 mg total) by mouth at bedtime. 30 capsule 0   No current facility-administered medications for this visit.    Allergies:   Azithromycin, Compazine [  prochlorperazine edisylate], Demerol, Dolophine [methadone], Ebastine, Erythromycin, Ibuprofen, Statins, Tetanus toxoids, and Tetracyclines & related    Social History:  The patient  reports that she has never smoked. She has never used smokeless tobacco. She reports that she does not drink alcohol or use drugs.   Family History:  The patient's ***family history includes Stroke in her father and mother.    ROS:  Please see the history of present illness.   Otherwise, review of systems are positive for {NONE DEFAULTED:18576::"none"}.   All other systems are reviewed and negative.    PHYSICAL EXAM: VS:  There were no vitals taken for this visit. , BMI There is no height or weight on file to calculate BMI. GEN: Well nourished, well developed, in no acute distress HEENT: normal Neck: no JVD, carotid bruits, or masses Cardiac: ***RRR; no murmurs, rubs, or gallops,no edema  Respiratory:  clear to auscultation bilaterally, normal work of breathing GI: soft, nontender, nondistended, + BS MS: no deformity or atrophy Skin: warm and dry, no rash Neuro:  Strength and sensation are intact Psych: euthymic mood, full affect   EKG:  EKG {ACTION; IS/IS VEH:20947096} ordered today. The ekg ordered today demonstrates ***   Recent Labs: 12/09/2019: ALT 28 12/10/2019: B Natriuretic Peptide 511.4; Hemoglobin 12.2; Platelets  186 12/11/2019: Magnesium 2.0 12/15/2019: BUN 18; Creatinine, Ser 0.80; Potassium 3.4; Sodium 134    Lipid Panel    Component Value Date/Time   CHOL 211 (H) 11/04/2012 1524   TRIG 201.0 (H) 11/04/2012 1524   HDL 66.80 11/04/2012 1524   CHOLHDL 3 11/04/2012 1524   VLDL 40.2 (H) 11/04/2012 1524   LDLCALC 109 (H) 04/16/2012 1043   LDLDIRECT 98.8 11/04/2012 1524      Wt Readings from Last 3 Encounters:  12/29/19 131 lb (59.4 kg)  12/15/19 128 lb 11.2 oz (58.4 kg)  10/18/19 132 lb (59.9 kg)      Other studies Reviewed: Additional studies/ records that were reviewed today include:   Echocardiogram 11/2019: 1. Left ventricular ejection fraction, by estimation, is 70 to 75%. The  left ventricle has hyperdynamic function. The left ventricle has no  regional wall motion abnormalities. Left ventricular diastolic parameters  are indeterminate.  2. Right ventricular systolic function is normal. The right ventricular  size is normal.  3. Left atrial size was mildly dilated.  4. Right atrial size was mildly dilated.  5. The mitral valve is normal in structure. Trivial mitral valve  regurgitation. No evidence of mitral stenosis.  6. The aortic valve is tricuspid. Aortic valve regurgitation is trivial.  Mild to moderate aortic valve sclerosis/calcification is present, without  any evidence of aortic stenosis.  7. The inferior vena cava is dilated in size with <50% respiratory  variability, suggesting right atrial pressure of 15 mmHg.   Comparison(s): No significant change from prior study. Prior images  reviewed side by side.      ASSESSMENT AND PLAN:  1.  ***   Current medicines are reviewed at length with the patient today.  The patient {ACTIONS; HAS/DOES NOT HAVE:19233} concerns regarding medicines.  The following changes have been made:  {PLAN; NO CHANGE:13088:s}  Labs/ tests ordered today include: *** No orders of the defined types were placed in this  encounter.    Disposition:   FU with *** in {gen number 2-83:[id number]} {Days to years:10300}  Signed, Abigail Butts, PA-C  01/24/2020 12:29 AM

## 2020-01-24 NOTE — Progress Notes (Signed)
Virtual Visit via Telephone Note   This visit type was conducted due to national recommendations for restrictions regarding the COVID-19 Pandemic (e.g. social distancing) in an effort to limit this patient's exposure and mitigate transmission in our community.  Due to her co-morbid illnesses, this patient is at least at moderate risk for complications without adequate follow up.  This format is felt to be most appropriate for this patient at this time.  The patient did not have access to video technology/had technical difficulties with video requiring transitioning to audio format only (telephone).  All issues noted in this document were discussed and addressed.  No physical exam could be performed with this format.  Please refer to the patient's chart for her  consent to telehealth for [facility].   The patient was identified using 2 identifiers.  Date:  01/24/2020   ID:  Kristen Whitehead, DOB 1922/12/28, MRN [id number]  Patient Location: Home Provider Location: Office  PCP:  Leighton Ruff, MD  Cardiologist:  [name], MD  Electrophysiologist:  None   Evaluation Performed:  Follow-Up Visit  Chief Complaint:  Recent LE edema  History of Present Illness:    Kristen Whitehead is a 86 y.o. female with PMH of chornic diastolic CHF, chronic atrial fibrillation, HTN, and HLD, who presents for follow-up of her LE edema.  She was last evaluated by cardiology at an outpatient visit with myself 12/29/19 at which time she had multiple concerns. She was working with her PCP to get home O2 due to concerns for hypoxia with O2 sats in the 70s and 80s at home. She reported feeling "swimmy headed" at that time. O2 sat was 93% on RA at rest today and suprisingly improved to 94% with ambulation. Encouraged her to continue to talk with her PCP to coordinate home O2. We discussed the the nature of her heart failure and importance of limiting salt intake to <2 grams and fluid intake to <2L  per day. She reported eating mostly microwave meals since she lives alone and doesn't like to cook. She also likely drinks more liquids than recommended. She's had improvement in her LE edema since discharge and reports stable chronic DOE. We reviewed her home BP log and she has some low blood pressures with SBP in the 100s. She has occasional dizziness, but thankfully no syncope or falls. She has no complaints of chest pain, SOB at rest, orthopnea, PND, or tachypalpitations.Echo 11/2019 showed EF 70-75%, indeterminate LV diastolic function, no RWMA, mild biatrial enlargement, and no significant valvular abnormalities. Her last ischemic evaluation was a NST in 2013 which was reportedly without ischemia.  She contacted our office 01/13/20 to report worsening LE edema and O2 sats in the 80s. She was recommended to increase her lasix to 60mg  in the morning and 40mg  in the evening.   She presents today for close follow-up. She reports improvement in her LE edema with the increase in her lasix - she reports taking 80mg  qAM and 40mg  qPM for the past week. She returned to her 40mg  BID dosing 01/23/20. She has had some DOE as well with an associated non-productive cough. No complaints of fevers. No clear orthopnea or PND. She is a bit of a poor historian because she reports she does not pay attention to subtle changes well as she has a strong christian faith and puts her trust in god. Weights have been overall stable 126lbs>127lb>127.8lbs over the past 3 days. No complaints of chest pain, palpitations, dizziness, lightheadedness, or  syncope. Still having intermittent hypoxia with O2 sats in the 80s at home on occasion. She is still working to get home O2, though cannot get insurance coverage as her O2 sats are never low in the office and she cannot afford to pay out of pocket. She reports blood pressures have been stable, still occasionally soft at times. She has continued to take irbesartan 150mg  BID, despite  recommendations to decrease to 150mg  daily at her last visit due to hypotension. She will reduce her irbesartan as recommended at this time.   The patient does not have symptoms concerning for COVID-19 infection (fever, chills, cough, or new shortness of breath).    Past Medical History:  Diagnosis Date  . Bladder prolapse, female, acquired   . Cancer ([place])    thyroid  . Chest pain    no known ischemic heart disease; negative Myoview July 2013. EF 75% with no ischemia.   . Chronic anticoagulation   . Chronic atrial fibrillation (HCC)    managed with rate control and coumadin  . Chronic diastolic heart failure ([place])   . GERD (gastroesophageal reflux disease)   . Heart disease   . History of congenital mitral regurgitation    mild  . History of mitral valve prolapse 06/15/2008   a. echo 1/14: mild LVH, EF 60-65%, mild MR, mild to mod BAE, PASP 35  . Hypercholesterolemia   . Hypertension   . Hypothyroidism    Past Surgical History:  Procedure Laterality Date  . ABDOMINAL HYSTERECTOMY    . CATARACT EXTRACTION    . CHOLECYSTECTOMY    . Thyroidectomy       Current Meds  Medication Sig  . amLODipine (NORVASC) 2.5 MG tablet Take 2 tablets (5 mg total) by mouth daily.  Marland Kitchen apixaban (ELIQUIS) 2.5 MG TABS tablet Take 1 tablet (2.5 mg total) by mouth 2 (two) times daily.  Marland Kitchen atenolol (TENORMIN) 50 MG tablet Take 1 tablet (50 mg total) by mouth 2 (two) times daily.  . benzonatate (TESSALON) 100 MG capsule Take 1 capsule (100 mg total) by mouth every 8 (eight) hours as needed for cough.  . Calcium Carbonate-Vitamin D (CALCIUM 600+D PO) Take 2 tablets by mouth 2 (two) times daily.   . Cholecalciferol (VITAMIN D) 2000 UNITS tablet Take 4,000 Units by mouth daily.   . dorzolamide (TRUSOPT) 2 % ophthalmic solution Place 1 drop into the left eye 2 (two) times daily.  . furosemide (LASIX) 40 MG tablet Take 1 tablet (40 mg total) by mouth 2 (two) times daily.  . irbesartan (AVAPRO) 150 MG tablet  Take 1 tablet (150 mg total) by mouth daily.  . isosorbide mononitrate (IMDUR) 30 MG 24 hr tablet Take 1 tablet (30 mg total) by mouth daily.  Marland Kitchen latanoprost (XALATAN) 0.005 % ophthalmic solution Place 1 drop into both eyes at bedtime.   Marland Kitchen levothyroxine (SYNTHROID, LEVOTHROID) 100 MCG tablet Take 100 mcg by mouth daily.   Marland Kitchen MAGNESIUM OXIDE 400 PO Take 800 mg by mouth daily.  . Multiple Vitamins-Minerals (CENTRUM SILVER PO) Take 1 tablet by mouth daily.  . Multiple Vitamins-Minerals (PRESERVISION AREDS 2 PO) Take 1 capsule by mouth 2 (two) times daily.   . nitroGLYCERIN (NITROSTAT) 0.4 MG SL tablet DISSOLVE 1 TABLET UNDER THE TONGUE EVERY 5 MINUTES FOR 3 DOSES AS NEEDED  FOR  CHEST  PAIN  . omeprazole (PRILOSEC) 40 MG capsule Take 1 capsule (40 mg total) by mouth daily.  . potassium chloride (KLOR-CON) 10 MEQ tablet TAKE TWO  TABLETS BY MOUTH EVERY DAY  . pramipexole (MIRAPEX) 0.25 MG tablet Take 1 tablet (0.25 mg total) by mouth 2 (two) times daily. (Patient taking differently: Take 0.25 mg by mouth at bedtime. )  . PREMARIN vaginal cream Place 2 g vaginally once a week. Uses on Sunday and wednesday  . Probiotic Product (PROBIOTIC DAILY PO) Take 1 tablet by mouth daily.  . sodium chloride (MURO 128) 5 % ophthalmic solution Place 1 drop into the right eye at bedtime.   . temazepam (RESTORIL) 15 MG capsule Take 1 capsule (15 mg total) by mouth at bedtime.     Allergies:   Azithromycin, Compazine [prochlorperazine edisylate], Demerol, Dolophine [methadone], Ebastine, Erythromycin, Ibuprofen, Statins, Tetanus toxoids, and Tetracyclines & related   Social History   Tobacco Use  . Smoking status: Never Smoker  . Smokeless tobacco: Never Used  Substance Use Topics  . Alcohol use: No  . Drug use: No     Family Hx: The patient's family history includes Stroke in her father and mother. There is no history of Heart attack or Heart disease.  ROS:   Please see the history of present illness.      All other systems reviewed and are negative.   Prior CV studies:   The following studies were reviewed today:  Echocardiogram 11/2019: 1. Left ventricular ejection fraction, by estimation, is 70 to 75%. The  left ventricle has hyperdynamic function. The left ventricle has no  regional wall motion abnormalities. Left ventricular diastolic parameters  are indeterminate.  2. Right ventricular systolic function is normal. The right ventricular  size is normal.  3. Left atrial size was mildly dilated.  4. Right atrial size was mildly dilated.  5. The mitral valve is normal in structure. Trivial mitral valve  regurgitation. No evidence of mitral stenosis.  6. The aortic valve is tricuspid. Aortic valve regurgitation is trivial.  Mild to moderate aortic valve sclerosis/calcification is present, without  any evidence of aortic stenosis.  7. The inferior vena cava is dilated in size with <50% respiratory  variability, suggesting right atrial pressure of 15 mmHg.   Comparison(s): No significant change from prior study. Prior images  reviewed side by side.   Labs/Other Tests and Data Reviewed:    EKG:  An ECG dated 12/29/19 was personally reviewed today and demonstrated:  atrial fibrillation with rate 67 bpm, LAFB, new TWI in III and aVF, no STE/D  Recent Labs: 12/09/2019: ALT 28 12/10/2019: B Natriuretic Peptide 511.4; Hemoglobin 12.2; Platelets 186 12/11/2019: Magnesium 2.0 12/15/2019: BUN 18; Creatinine, Ser 0.80; Potassium 3.4; Sodium 134   Recent Lipid Panel Lab Results  Component Value Date/Time   CHOL 211 (H) 11/04/2012 03:24 PM   TRIG 201.0 (H) 11/04/2012 03:24 PM   HDL 66.80 11/04/2012 03:24 PM   CHOLHDL 3 11/04/2012 03:24 PM   LDLCALC 109 (H) 04/16/2012 10:43 AM   LDLDIRECT 98.8 11/04/2012 03:24 PM    Wt Readings from Last 3 Encounters:  01/24/20 127 lb 12.8 oz (58 kg)  12/29/19 131 lb (59.4 kg)  12/15/19 128 lb 11.2 oz (58.4 kg)     Objective:    Vital  Signs:  BP 132/67   Pulse 71   Ht 5' (1.524 m)   Wt 127 lb 12.8 oz (58 kg)   SpO2 91%   BMI 24.96 kg/m    VITAL SIGNS:  reviewed GEN:  no acute distress RESPIRATORY:  speaking in full sentences without SOB CARDIOVASCULAR:  no peripheral edema NEURO:  A&O  x3 PSYCH:  normal affect  ASSESSMENT & PLAN:    1. Acute on chronic diastolic EKC:[id number] still has some extra volume on board given ongoing SOB, cough, and some weight gain. - Will check a CXR  - Will check BMET and BNP to monitor kidney function/electrolytes and better assess volume status.  - Continue lasix40mg  BID with plans to take an addition 40mg  for weight gain of 3lbs overnight or 5lbs in 1 week. Instructed to take additional potassium if taking additional lasix.  - Encouraged low sodium diet and limiting fluid intake to <2L per day  2. Chronic atrial fibrillation:No complaints of palpitations - Continue atenolol for rate control - Continue eliquis 2.5mg  BID for stroke ppx (does adjusted for age and wt <60kg)   3. HTN:BP 132/67today.  - Continue amlodpine, atenolol, imdur,irbesartan, andlasix  4. HLD: LDL68 in 2019; intolerant to statins - Continue dietary modifications to lower cholesterol  5. EKG changes:she has new TWI in inferior leads on EKG 12/29/19. No significant chest pain episodes to suggest ischemic etiology.  - Will continue to monitor symptoms at this time.  - Could consider an ischemic evaluation if she experiences chest pain going forward given good functional status.  COVID-19 Education: The signs and symptoms of COVID-19 were discussed with the patient and how to seek care for testing (follow up with PCP or arrange E-visit).  The importance of social distancing was discussed today.  Time:   Today, I have spent 21 minutes with the patient with telehealth technology discussing the above problems.     Medication Adjustments/Labs and Tests Ordered: Current medicines are reviewed at length  with the patient today.  Concerns regarding medicines are outlined above.   Tests Ordered: Orders Placed This Encounter  Procedures  . DG Chest 2 View  . B Nat Peptide  . Basic Metabolic Panel (BMET)    Medication Changes: No orders of the defined types were placed in this encounter.   Follow Up:  In Person with Dr. Oval Linsey as scheduled 03/2020  Signed, Abigail Butts, PA-C  01/24/2020 12:10 PM    [place]

## 2020-01-24 NOTE — Patient Instructions (Signed)
Medication Instructions:  Continue current medications  *If you need a refill on your cardiac medications before your next appointment, please call your pharmacy*   Lab Work: BMP and BNP  If you have labs (blood work) drawn today and your tests are completely normal, you will receive your results only by: Marland Kitchen MyChart Message (if you have MyChart) OR . A paper copy in the mail If you have any lab test that is abnormal or we need to change your treatment, we will call you to review the results.   Testing/Procedures: A chest x-ray takes a picture of the organs and structures inside the chest, including the heart, lungs, and blood vessels. This test can show several things, including, whether the heart is enlarges; whether fluid is building up in the lungs; and whether pacemaker / defibrillator leads are still in place.    Follow-Up: At [facility], you and your health needs are our priority.  As part of our continuing mission to provide you with exceptional heart care, we have created designated Provider Care Teams.  These Care Teams include your primary Cardiologist (physician) and Advanced Practice Providers (APPs -  Physician Assistants and Nurse Practitioners) who all work together to provide you with the care you need, when you need it.  We recommend signing up for the patient portal called "MyChart".  Sign up information is provided on this After Visit Summary.  MyChart is used to connect with patients for Virtual Visits (Telemedicine).  Patients are able to view lab/test results, encounter notes, upcoming appointments, etc.  Non-urgent messages can be sent to your provider as well.   To learn more about what you can do with MyChart, go to [email or web address].    Your next appointment:   Keep appointment with Dr Oval Linsey in August  The format for your next appointment:   In Person  Provider:   [name], MD

## 2020-01-28 LAB — BASIC METABOLIC PANEL
BUN/Creatinine Ratio: 13 (ref 12–28)
BUN: 11 mg/dL (ref 10–36)
CO2: 28 mmol/L (ref 20–29)
Calcium: 8.8 mg/dL (ref 8.7–10.3)
Chloride: 98 mmol/L (ref 96–106)
Creatinine, Ser: 0.82 mg/dL (ref 0.57–1.00)
GFR calc Af Amer: 70 mL/min/{1.73_m2} (ref >59)
GFR calc non Af Amer: 61 mL/min/{1.73_m2} (ref >59)
Glucose: 103 mg/dL — ABNORMAL HIGH (ref 65–99)
Potassium: 4.1 mmol/L (ref 3.5–5.2)
Sodium: 140 mmol/L (ref 134–144)

## 2020-01-28 LAB — BRAIN NATRIURETIC PEPTIDE: BNP: 510.7 pg/mL — ABNORMAL HIGH (ref 0.0–100.0)

## 2020-02-01 ENCOUNTER — Other Ambulatory Visit: Payer: Self-pay | Admitting: Medical

## 2020-02-02 ENCOUNTER — Other Ambulatory Visit: Payer: Self-pay | Admitting: Medical

## 2020-02-03 ENCOUNTER — Ambulatory Visit
Admission: RE | Admit: 2020-02-03 | Discharge: 2020-02-03 | Disposition: A | Discharge status: Home / Self Care | Payer: Medicare Other | Source: Ambulatory Visit | Attending: Medical | Admitting: Medical

## 2020-02-03 ENCOUNTER — Other Ambulatory Visit: Payer: Self-pay

## 2020-02-03 DIAGNOSIS — R059 Cough, unspecified: Secondary | ICD-10-CM

## 2020-02-03 DIAGNOSIS — R0602 Shortness of breath: Secondary | ICD-10-CM

## 2020-02-07 ENCOUNTER — Other Ambulatory Visit: Payer: Self-pay | Admitting: *Deleted

## 2020-02-07 NOTE — Patient Outreach (Signed)
[facility]) Care Management  02/07/2020  Kristen Whitehead June 27, 1923 [id number]   Telephone Assessment-Successful  RN spoke with pt today and received an update on her ongoing management of care. Pt reports she has not weighted this morning however reports yesterday at 127 lbs and her average range between 124-128 lbs. Pt denies any HF symptoms (in the GREEN zone) with no swelling or related symptoms. Pt reports her provider has permitted her to take an "extra fluid pill" if she gains a certain weight with any related swelling. RN verified pt's weight gained with related regimen and pt's comfort zone. Also verified pt's adherence with her medications and medical appointments. Pt states she does not have as many office visits as she likes and thinking about switching providers for more office visits. Pt has discussed this information with her provider's ofiice. RN available to assist however pt is aware that most providers offices are following the same lead due to the risk involvled with other patients and staff in the offices. Pt aware if this is something she wants to pursue [facility] office is able to assist with locating other office however pt would need to make her request for more office visits directly with the choose provider (verbalized an understanding).  Plan of care discussed in detail along with goals and interventions adjusted accordingly. Will follow up next month with ongoing care management services related to pt's HF. Will continue communication with pt's provider per protocol.  THN CM Care Plan Problem One     Most Recent Value  Care Plan Problem One Deficient Knowledge related to CHF symptom management  Role Documenting the Problem One Care Management Telephonic Coordinator  Care Plan for Problem One Active  THN Long Term Goal  Pt will verbalize two signs and symptoms of CHF exacerbation that should be reported to provided in the next 90 days.  THN Long Term  Goal Start Date 12/28/19  Interventions for Problem One Long Term Goal Will continue to discuss and educate accordingly as pt continues to retain information  on her ongoing management of care.  Will discuss communication with her provider on regimen/action plan if she gains retention fluid related to her HF. Will discussed the HF zones and verify pt remains in the GREEN zone with no acute symptoms.  THN CM Short Term Goal #1  Pt will weigh self daily and record for providers to view in journal within the next 30 days.  THN CM Short Term Goal #1 Start Date 12/28/19  Interventions for Short Term Goal #1 Will extend to allow ongoing adherence. Will stress the importance of fluid retention and it is importance to complete daily weights as a prevention measures to intervene early to avoid acute events. Will continue to stress daily weights and document all readings to avoid related symptoms.   THN CM Short Term Goal #2  Pt will be adherent to all medical appointments post d/c over the next 30 days.  THN CM Short Term Goal #2 Start Date 12/28/19  [facility] CM Short Term Goal #2 Met Date 02/07/20  Interventions for Short Term Goal #2 Altough this goal is met pt would perfer more office visits  and pt is searching for a new provider that may office more office visits.       Raina Mina, RN Care Management Coordinator [place] Office [phone]

## 2020-02-10 ENCOUNTER — Telehealth: Payer: Self-pay | Admitting: Cardiovascular Disease

## 2020-02-10 NOTE — Telephone Encounter (Signed)
Per pt had gained 5 lbs and pt took an extra 40 mg of Lasix and dropped 2 lb still has 3 lbs Pt feels fine but feet are still swollen Pt currently taking Furosemide 40 mg bid with the exception the one day took 80 mg in am and 40 mg in pm Will forward to Dr Oval Linsey for review and recommendations./cy

## 2020-02-10 NOTE — Telephone Encounter (Signed)
Pt c/o swelling: STAT is pt has developed SOB within 24 hours  1) How much weight have you gained and in what time span? 5lbs in 3 days   2) If swelling, where is the swelling located? Both feet and ankles  3) Are you currently taking a fluid pill? Yes   4) Are you currently SOB? No more than usual  5) Do you have a log of your daily weights (if so, list)? yes  6) Have you gained 3 pounds in a day or 5 pounds in a week? yes  7) Have you traveled recently? no  Patient states that since upping the medication, she has only lost 1lb.

## 2020-02-16 NOTE — Telephone Encounter (Signed)
Advised patient, verbalized understanding.   Patient did ask about the Irbesartan should she be on 300 mg 1/2 tablet twice a day or once a day  Patient has been taking 300 mg 1/2 twice a day for some time Blood pressure has been good per patient and blood pressure actually up last couple days 130's-140's  Advised to continue current regimen

## 2020-02-16 NOTE — Telephone Encounter (Signed)
Increase AM lasix to 80mg  on MWF and 40mg  the other days.  COntinue 40mg  in the afternoon every day.

## 2020-02-18 NOTE — Telephone Encounter (Signed)
Either 150mg  bid or 300mg  daily.  It doesn't really matter as long as she is consistent.

## 2020-02-24 ENCOUNTER — Telehealth: Payer: Self-pay | Admitting: Cardiovascular Disease

## 2020-02-24 NOTE — Telephone Encounter (Signed)
Advised patient, verbalized understanding  

## 2020-02-24 NOTE — Telephone Encounter (Signed)
She should be on 2.5mg  given age >75 and weight now <60 kg

## 2020-02-24 NOTE — Telephone Encounter (Signed)
Can you cut eliquis??  Thank you!

## 2020-02-24 NOTE — Telephone Encounter (Signed)
If you have trouble swallowing Eliquis tablets whole, they can be crushed. However, Eliquis tablets shouldn't be cut in half or chewed.  Crushed tablets can be mixed with water, apple juice, apple sauce, or 5% dextrose in water (a type of sugar solution). The mixture should be taken by mouth right after mixing.

## 2020-02-24 NOTE — Telephone Encounter (Signed)
Pt c/o medication issue:  1. Name of Medication: apixaban (ELIQUIS) 2.5 MG TABS tablet  2. How are you currently taking this medication (dosage and times per day)? As directed  3. Are you having a reaction (difficulty breathing--STAT)? No  4. What is your medication issue? Patient wants to know if we can call in a prescription for 5mg  in to [place], [place]. She states that it is cheaper this way. Please advise.

## 2020-02-24 NOTE — Telephone Encounter (Signed)
Called patient back- she states that she just wanted to cut this in half to save her over $100.  She states she use to be on the 5 mg tablet and had cut it before, but I advised of the message from PharmD.  And advised that I would send a message to Dr.Hydro to advise if we could go back to the 5 mg as she is close to the weigh mark and know this she could save money.   Will route to MD to advise.

## 2020-02-25 ENCOUNTER — Other Ambulatory Visit: Payer: Self-pay

## 2020-02-25 MED ORDER — POTASSIUM CHLORIDE ER 10 MEQ PO TBCR: 20.0000 meq | EXTENDED_RELEASE_TABLET | Freq: Every day | ORAL | 3 refills | Status: DC

## 2020-02-29 ENCOUNTER — Telehealth: Payer: Self-pay | Admitting: Cardiovascular Disease

## 2020-02-29 ENCOUNTER — Other Ambulatory Visit: Payer: Self-pay | Admitting: Cardiovascular Disease

## 2020-02-29 MED ORDER — FUROSEMIDE 40 MG PO TABS: 40.0000 mg | ORAL_TABLET | Freq: Two times a day (BID) | ORAL | 3 refills | Status: DC

## 2020-02-29 NOTE — Telephone Encounter (Signed)
REFILL SENT AS REQUESTED ./CY ?

## 2020-02-29 NOTE — Telephone Encounter (Signed)
Pt c/o medication issue:  1. Name of Medication: furosemide (LASIX) 40 MG tablet  2. How are you currently taking this medication (dosage and times per day)? As written   3. Are you having a reaction (difficulty breathing--STAT)? No  4. What is your medication issue? Patient needs a new prescription sent to [facility]. Fax number is [phone]. Telephone number is [phone]

## 2020-03-03 ENCOUNTER — Other Ambulatory Visit: Payer: Self-pay

## 2020-03-03 ENCOUNTER — Telehealth: Payer: Self-pay | Admitting: Cardiovascular Disease

## 2020-03-03 MED ORDER — POTASSIUM CHLORIDE ER 10 MEQ PO TBCR
10.0000 meq | EXTENDED_RELEASE_TABLET | Freq: Every day | ORAL | 3 refills | Status: DC
Start: 2020-03-03 — End: 2020-10-20

## 2020-03-03 MED ORDER — AMLODIPINE BESYLATE 2.5 MG PO TABS: 5.0000 mg | ORAL_TABLET | Freq: Every day | ORAL | 11 refills | Status: DC

## 2020-03-03 MED ORDER — FUROSEMIDE 40 MG PO TABS: 40.0000 mg | ORAL_TABLET | Freq: Two times a day (BID) | ORAL | 3 refills | Status: DC

## 2020-03-03 NOTE — Telephone Encounter (Signed)
Follow Up:    Pt said [employer] said they still have not received her  preescription.  Please refer to this reference# [id number] when you called. She says she need this asap please.  She said she would like all her medicine to be sent to [place] when she needs medicine.Marland Kitchen

## 2020-03-08 ENCOUNTER — Other Ambulatory Visit: Payer: Self-pay | Admitting: *Deleted

## 2020-03-08 NOTE — Patient Outreach (Signed)
[facility]) Care Management  03/08/2020  NASHAY BRICKLEY 1923/06/12 [id number]   Telephone Assessment-Heart Failure  Spoke with pt today who provided an update on her ongoing management of care. Reports her weight today at 126.8 lbs, yesterday 127.4 lbs and last week at 128 lbs. Pt denies any symptoms with no swelling or breathing issues. Pt is aware of her regimen for taking extra medications for fluid retention however pt states she quickly resolves her add weight in 1-2 days and tries to avoid taking the medications because it's to strong for her but he take it if needed. Pt reports no need for home O2 with good saturations and she remains very mobile with her outings with a very supportive grandson. No issues or complaints as pt very adherent to the discussed plan of care.   Plan: Will follow up next month with ongoing disease management services and re-evaluate pt's progress.   Goals Addressed              This Visit's Progress   .  To keep going (pt-stated)        CARE PLAN ENTRY (see longtitudinal plan of care for additional care plan information)   Current Barriers:  Marland Kitchen Knowledge deficit related to basic heart failure pathophysiology and self care management . Knowledge Deficits related to heart failure medications  Case Manager Clinical Goal(s):  Marland Kitchen Over the next 90 days, patient will verbalize understanding of Heart Failure Action Plan and when to call doctor . Over the next 30 days, patient will weigh daily and record (notifying MD of 3 lb weight gain over night or 5 lb in a week) . Patient will verbalize two symptoms of CHF exacerbation within the next 30 days.    Interventions:  . Basic overview and discussion of pathophysiology of Heart Failure reviewed  . Provided verbal education on low sodium diet . Advised patient to weigh each morning after emptying bladder . Discussed importance of daily weight and advised patient to weigh and record  daily . Reviewed role of diuretics in prevention of fluid overload and management of heart failure  Patient Self Care Activities:  . Weighs daily and record (notifying MD of 3 lb weight gain over night or 5 lb in a week) . Verbalizes understanding of and follows CHF Action Plan . Adheres to low sodium diet  Initial goal documentation        Raina Mina, RN Care Management Coordinator [place] Office [phone]

## 2020-04-04 ENCOUNTER — Ambulatory Visit: Payer: Medicare Other | Admitting: Cardiovascular Disease

## 2020-04-04 ENCOUNTER — Other Ambulatory Visit: Payer: Self-pay

## 2020-04-04 ENCOUNTER — Encounter: Payer: Self-pay | Admitting: Cardiovascular Disease

## 2020-04-04 VITALS — BP 98/60 | HR 64 | Ht 60.0 in | Wt 131.0 lb

## 2020-04-04 DIAGNOSIS — I10 Essential (primary) hypertension: Secondary | ICD-10-CM | POA: Diagnosis not present

## 2020-04-04 DIAGNOSIS — I340 Nonrheumatic mitral (valve) insufficiency: Secondary | ICD-10-CM | POA: Diagnosis not present

## 2020-04-04 DIAGNOSIS — I480 Paroxysmal atrial fibrillation: Secondary | ICD-10-CM | POA: Diagnosis not present

## 2020-04-04 DIAGNOSIS — Z7901 Long term (current) use of anticoagulants: Secondary | ICD-10-CM

## 2020-04-04 DIAGNOSIS — I5033 Acute on chronic diastolic (congestive) heart failure: Secondary | ICD-10-CM

## 2020-04-04 NOTE — Progress Notes (Signed)
Cardiology Office Note   Date:  04/04/2020   ID:  Kristen Whitehead 11/14/1922, MRN [id number]  PCP:  Kristen Ruff, MD  Cardiologist:   Kristen Latch, MD   No chief complaint on file.    History of Present Illness: Kristen Whitehead is a 86 y.o. female with chronic atrial fibrillation, hypertension, hyperlipidemia, mild MR and AR and chronic diastolic heart failure who presents for follow up. Kristen Whitehead was previously a patient of Kristen Whitehead. She last saw him 08/2015 for follow-up on frequent episodes of chest pain. She had been referred for stress testing but declined, as she has not tolerated Lexiscan well in the past. Troponin was checked and was negative. This is been a chronic problem so further evaluation was deferred. Her stress test in 2013 was negative for ischemia. She reports having "heart attacks" that occur when she overexerts herself. When this happens she gets pain across her chest that is associated with shortness of breath. She denies lower extremity edema, orthopnea or PND. She watched a documentary on broken heart syndrome and thinks this must be what happens to her. When she gets the pain she prays for God to take it away and eventually the pain subsides. There is no associated nausea or diaphoresis. Imdur was increased to 90 mg daily on 10/2015. Since then she had an episode of heart failure 01/2016 that responded to increased lasix. She also has chronic exertional dyspnea.  Kristen Whitehead, [name], on 12/11/16. Lasix was increased to 60 mg in the morning and 40 in the afternoon. There was no change in her symptoms.  She followed up with Kristen Flake, DNP on 01/2018 and her energy was worse but her BP was better. She felt better with physical activity. Her BP was low in the morning so Imdur was decreased to 60mg  and she started taking it in the morning instead of the afternoon. At her last appointment her blood  pressure was elevated so amlodipine was increased.  Since then her blood pressures been much better controlled.  She has struggled with losing strength over the last year.  She feels that the pandemic really set her back.  She had a fall several months ago.  She did some physical therapy but has not fully regained her strength.  She is using a cane for balance.  She is not getting as much exercise as she used to.  She was walking better prior to the fall and does not feel that she is ever fully recovered.  She has no chest pain other than GERD.  It was better controlled on ranitidine.  She is not sleeping well and has difficulty falling asleep.  She also struggles with frequent urination and does not want to increase her Lasix.  She does continue to have some mild lower extremity edema that does not improve with elevation of her legs.  She has 1 pillow orthopnea.  She has increased edema and shortness of breath so lasix was increased to 40mg  bid.  Lately she has been feeling well.  Her fluid levels have been stable but she gained two pounds so far this week.  She continues to limit sodium intake.  She notes that her BP has been well-controlled at home.  She has no lightheadedness or dizziness.  She gets short of breath if she moves too quickly.  She notices that her BP goes up a lot if she is stressed.  She notes that her feet swell sometimes.  She sits for long periods of time sometimes.  She has no exertional chest pain, orthopnea or PND.   Past Medical History:  Diagnosis Date  . Bladder prolapse, female, acquired   . Cancer ([place])    thyroid  . Chest pain    no known ischemic heart disease; negative Myoview July 2013. EF 75% with no ischemia.   . Chronic anticoagulation   . Chronic atrial fibrillation (HCC)    managed with rate control and coumadin  . Chronic diastolic heart failure ([place])   . GERD (gastroesophageal reflux disease)   . Heart disease   . History of congenital mitral regurgitation     mild  . History of mitral valve prolapse 06/15/2008   a. echo 1/14: mild LVH, EF 60-65%, mild MR, mild to mod BAE, PASP 35  . Hypercholesterolemia   . Hypertension   . Hypothyroidism     Past Surgical History:  Procedure Laterality Date  . ABDOMINAL HYSTERECTOMY    . CATARACT EXTRACTION    . CHOLECYSTECTOMY    . Thyroidectomy       Current Outpatient Medications  Medication Sig Dispense Refill  . amLODipine (NORVASC) 2.5 MG tablet Take 2 tablets (5 mg total) by mouth daily. 60 tablet 11  . apixaban (ELIQUIS) 2.5 MG TABS tablet Take 1 tablet (2.5 mg total) by mouth 2 (two) times daily. 180 tablet 3  . benzonatate (TESSALON) 100 MG capsule Take 1 capsule (100 mg total) by mouth every 8 (eight) hours as needed for cough. 21 capsule 0  . Calcium Carbonate-Vitamin D (CALCIUM 600+D PO) Take 2 tablets by mouth 2 (two) times daily.     . Cholecalciferol (VITAMIN D) 2000 UNITS tablet Take 4,000 Units by mouth daily.     . dorzolamide (TRUSOPT) 2 % ophthalmic solution Place 1 drop into the left eye 2 (two) times daily.    . furosemide (LASIX) 40 MG tablet Take 1 tablet (40 mg total) by mouth 2 (two) times daily. May take an additional 40 mg as needed for swelling or weight gain 195 tablet 3  . irbesartan (AVAPRO) 300 MG tablet Take 300 mg by mouth as directed. 1/2 tablet daily    . isosorbide mononitrate (IMDUR) 30 MG 24 hr tablet Take 1 tablet (30 mg total) by mouth daily. 90 tablet 1  . latanoprost (XALATAN) 0.005 % ophthalmic solution Place 1 drop into both eyes at bedtime.     Marland Kitchen levothyroxine (SYNTHROID, LEVOTHROID) 100 MCG tablet Take 100 mcg by mouth daily.     Marland Kitchen MAGNESIUM OXIDE 400 PO Take 800 mg by mouth daily.    . Multiple Vitamins-Minerals (CENTRUM SILVER PO) Take 1 tablet by mouth daily.    . Multiple Vitamins-Minerals (PRESERVISION AREDS 2 PO) Take 1 capsule by mouth 2 (two) times daily.     . nitroGLYCERIN (NITROSTAT) 0.4 MG SL tablet DISSOLVE 1 TABLET UNDER THE TONGUE EVERY 5  MINUTES FOR 3 DOSES AS NEEDED  FOR  CHEST  PAIN 50 tablet 2  . omeprazole (PRILOSEC) 40 MG capsule TAKE ONE CAPSULE BY MOUTH EVERY DAY 90 capsule 3  . potassium chloride (KLOR-CON) 10 MEQ tablet Take 1 tablet (10 mEq total) by mouth daily. 90 tablet 3  . pramipexole (MIRAPEX) 0.125 MG tablet Take 1 tablet by mouth at bedtime.    Marland Kitchen PREMARIN vaginal cream Place 2 g vaginally once a week. Uses on Sunday and wednesday    . Probiotic Product (PROBIOTIC DAILY PO) Take 1 tablet by mouth daily.    Marland Kitchen  sodium chloride (MURO 128) 5 % ophthalmic solution Place 1 drop into the right eye at bedtime.     Marland Kitchen atenolol (TENORMIN) 50 MG tablet Take 1 tablet (50 mg total) by mouth 2 (two) times daily. 180 tablet 3  . temazepam (RESTORIL) 15 MG capsule Take 1 capsule (15 mg total) by mouth at bedtime. 30 capsule 0   No current facility-administered medications for this visit.    Allergies:   Azithromycin, Compazine [prochlorperazine edisylate], Demerol, Dolophine [methadone], Ebastine, Erythromycin, Ibuprofen, Statins, Tetanus toxoids, and Tetracyclines & related    Social History:  The patient  reports that she has never smoked. She has never used smokeless tobacco. She reports that she does not drink alcohol and does not use drugs.   Family History:  The patient's family history includes Stroke in her father and mother.   ROS:  Please see the history of present illness.   Otherwise, review of systems are positive for none.   All other systems are reviewed and negative.    PHYSICAL EXAM: VS:  BP 98/60   Pulse 64   Ht 5' (1.524 m)   Wt 131 lb (59.4 kg)   BMI 25.58 kg/m  , BMI Body mass index is 25.58 kg/m. GENERAL:  Well appearing HEENT: Pupils equal round and reactive, fundi not visualized, oral mucosa unremarkable NECK:  No jugular venous distention, waveform within normal limits, carotid upstroke brisk and symmetric, no bruits LUNGS:  Clear to auscultation bilaterally HEART:  Irregularly irregular.   PMI not displaced or sustained,S1 and S2 within normal limits, no S3, no S4, no clicks, no rubs, II/VI holosystolic murmur at the apex ABD:  Flat, positive bowel sounds normal in frequency in pitch, no bruits, no rebound, no guarding, no midline pulsatile mass, no hepatomegaly, no splenomegaly EXT:  2 plus pulses throughout, 1+ bilateraly LE edema no cyanosis no clubbing SKIN:  No rashes no nodules NEURO:  Cranial nerves II through XII grossly intact, motor grossly intact throughout PSYCH:  Cognitively intact, oriented to person place and time   EKG:  EKG is not ordered today. 05/06/16: Atrial fibrillation rate 72 bpm 06/15/19: Atrial fibrillation.  Rate 72 bpm.    Echo 12/11/19:  1. Left ventricular ejection fraction, by estimation, is 70 to 75%. The  left ventricle has hyperdynamic function. The left ventricle has no  regional wall motion abnormalities. Left ventricular diastolic parameters  are indeterminate.  2. Right ventricular systolic function is normal. The right ventricular  size is normal.  3. Left atrial size was mildly dilated.  4. Right atrial size was mildly dilated.  5. The mitral valve is normal in structure. Trivial mitral valve  regurgitation. No evidence of mitral stenosis.  6. The aortic valve is tricuspid. Aortic valve regurgitation is trivial.  Mild to moderate aortic valve sclerosis/calcification is present, without  any evidence of aortic stenosis.  7. The inferior vena cava is dilated in size with <50% respiratory  variability, suggesting right atrial pressure of 15 mmHg.   Recent Labs: 12/09/2019: ALT 28 12/10/2019: Hemoglobin 12.2; Platelets 186 12/11/2019: Magnesium 2.0 01/27/2020: BNP 510.7; BUN 11; Creatinine, Ser 0.82; Potassium 4.1; Sodium 140    Lipid Panel    Component Value Date/Time   CHOL 211 (H) 11/04/2012 1524   TRIG 201.0 (H) 11/04/2012 1524   HDL 66.80 11/04/2012 1524   CHOLHDL 3 11/04/2012 1524   VLDL 40.2 (H) 11/04/2012 1524    LDLCALC 109 (H) 04/16/2012 1043   LDLDIRECT 98.8 11/04/2012 1524  03/28/16: Sodium 136, potassium 4.1, BUN 17, creatinine 0.74 AST 24, ALT 17 WBC 10, hemoglobin 15.7, hematocrit 46.8, platelets 291  07/21/15: TSH 1.52, free T4 1 0.25  04/13/15: Total cholesterol 235, triglycerides 150, HDL 66, LDL 139  03/08/15: BNP 601  Wt Readings from Last 3 Encounters:  04/04/20 131 lb (59.4 kg)  01/24/20 127 lb 12.8 oz (58 kg)  12/29/19 131 lb (59.4 kg)      ASSESSMENT AND PLAN:  # Hypertension: BPis well-controlled.  It is low today.  We will reduce irbesartan to 150mg  daily.  Continue amlodipine, atenolol, and Imdur.  If she develops lightheadedness, reduce irbesartan to 150 mg daily.  # Chest pain:Resolved. Continue omeprazole for GERD.  # Chronic atrial fibrillation:Rate remainswell-controlled. Continue atenolol and Eliquis.  She is on Eliquis to 2.5 mg given her weight less than 60 kg and age. This patients CHA2DS2-VASc Score and unadjusted Ischemic Stroke Rate (% per year) is equal to 4.8 % stroke rate/year from a score of 4  Above score calculated as 1 point each if present [CHF, HTN, DM, Vascular=MI/PAD/Aortic Plaque, Age if 65-74, or Female] Above score calculated as 2 points each if present [Age >75, or Stroke/TIA/TE]  #Chronic diastolic heart failure: Stable. Continue BP control and lasix as above.  # Hyperlipidemia: LDL was 68 on 06/2018    Current medicines are reviewed at length with the patient today.  The patient does not have concerns regarding medicines.  The following changes have been made:  none  Labs/ tests ordered today include:   No orders of the defined types were placed in this encounter.    Disposition:   FU with Mily Malecki C. Oval Linsey, MD, [facility] in 6 months.     Signed, Kierstan Auer C. Oval Linsey, MD, [facility]  04/04/2020 6:02 PM    [facility]

## 2020-04-04 NOTE — Patient Instructions (Addendum)
Medication Instructions:  TAKE YOUR IRBESARTAN 300 MG 1/2 TABLET ONCE DAILY   *If you need a refill on your cardiac medications before your next appointment, please call your pharmacy*  Lab Work: NONE  Testing/Procedures: NONE  Follow-Up: At [facility], you and your health needs are our priority.  As part of our continuing mission to provide you with exceptional heart care, we have created designated Provider Care Teams.  These Care Teams include your primary Cardiologist (physician) and Advanced Practice Providers (APPs -  Physician Assistants and Nurse Practitioners) who all work together to provide you with the care you need, when you need it.  We recommend signing up for the patient portal called "MyChart".  Sign up information is provided on this After Visit Summary.  MyChart is used to connect with patients for Virtual Visits (Telemedicine).  Patients are able to view lab/test results, encounter notes, upcoming appointments, etc.  Non-urgent messages can be sent to your provider as well.   To learn more about what you can do with MyChart, go to [email or web address].    Your next appointment:   6 month(s)  The format for your next appointment:   In Person  Provider:   You may see [name], MD or one of the following Advanced Practice Providers on your designated Care Team:    Kerin Ransom, PA-C  [place], [place]  Coletta Memos, [place]   Other Instructions  CALL [facility] AT [place]

## 2020-04-13 ENCOUNTER — Other Ambulatory Visit: Payer: Self-pay | Admitting: *Deleted

## 2020-04-13 NOTE — Addendum Note (Signed)
Addended by: Raina Mina D on: 04/13/2020 01:49 PM   Modules accepted: Orders

## 2020-04-13 NOTE — Patient Outreach (Addendum)
[place] [facility]) Care Management  04/13/2020  DEBANY VANTOL 27-Aug-1922 [id number]   Telephone Assessment-HF  RN spoke with pt today and received an update. Pt states issues on yesterday where she had low sats on her pulse ox (70%). States after resting and "deep breathes sats increased to 90% and she felt better however pt states she still remains weak with no energy and not feeling like herself today. Plan of care discuss with no related symptoms and pt reports her weights 127-129 lbs with 3 lbs over night or 5 lbs in one week. Pt continue to manage her HF with no issues other then her recent breathing episode with lack of oxygen.  Denies any pain or swelling anywhere but feels she is not getting enough O2 at times. Pt in the bathroom on this call and unable to take her sats but requested a call back and she will check the reading. States the [facility] RN will be visiting today at 12:00PM for follow up on her recent symptoms. Pt does not feel it's really urgent just very concerned that she is not feeling like herself.  States this week is the last visit from [place].   Plan: Will follow up in 15 minutes as request on pt's sat levels.  Addendum 10:30 AM: Reports her weight at 126 lbs no SOB and her sats 97% (feels much better). Plan of care reviewed once again as RN reiterated on the goals and interventions. Pt requested possible assistance in the home. RN offered social worker consult and available resources for possible services however pt indicated she is very limited on her finances for out of pocket expenses but will consider. Reports her income is around $1500. Based upon pt's age and needs RN discussed levels of care for possible assisted living and encouraged pt to inquire with a few of these facilities for possible future placement. Pt receptive to the information but does not wish to pursue this route at this time but aware this would be the next step. No other  resources or needs at this time.  Plan: Will update the plan of care and encouraged ongoing adherence in following all discussed. Will notify the provider of pt's disposition with [facility] services and make a referral for [facility] social worker for community resources. Will follow up next month with ongoing care management services.   Goals Addressed              This Visit's Progress   .  To keep going (pt-stated)   On track     [place] (see longtitudinal plan of care for additional care plan information)   Current Barriers:  Marland Kitchen Knowledge deficit related to basic heart failure pathophysiology and self care management . Knowledge Deficits related to heart failure medications  Case Manager Clinical Goal(s):  Marland Kitchen Over the next 90 days, patient will verbalize understanding of Heart Failure Action Plan and when to call doctor . Over the next 30 days, patient will weigh daily and record (notifying MD of 3 lb weight gain over night or 5 lb in a week) . Patient will verbalize two symptoms of CHF exacerbation within the next 30 days.    Interventions:  . Basic overview and discussion of pathophysiology of Heart Failure reviewed  . Provided verbal education on low sodium diet . Advised patient to weigh each morning after emptying bladder . Discussed importance of daily weight and advised patient to weigh and record daily . Reviewed role of diuretics  in prevention of fluid overload and management of heart failure  Patient Self Care Activities:  . Weighs daily and record (notifying MD of 3 lb weight gain over night or 5 lb in a week) . Verbalizes understanding of and follows CHF Action Plan . Adheres to low sodium diet  Initial goal documentation        Raina Mina, RN Care Management Coordinator [place] Office [phone]

## 2020-04-18 ENCOUNTER — Other Ambulatory Visit: Payer: Self-pay | Admitting: *Deleted

## 2020-04-18 NOTE — Patient Outreach (Signed)
[place] [facility]) Care Management  04/18/2020  Kristen Whitehead [birth year] [id number]   CSW received referral on 04/13/2020 for home housekeeping assistance.  CSW made contact with pt today and confirmed identity.  CSW introduced self, role and reason for call.  Per pt, she lives alone, drives "when I need to but not far".  Pt also shares that her son lives in [place] and her daughter is local but wheelchair bound.  "My son in law helps get my groceries and with other things".  Per pt, she is a relatively active 86 year old.  "My discipline for exercising needs to improve". CSW commended her for what she is doing and able to do.  Pt would like some assistance with housekeeping; "Maybe once a month to help with mopping and other things".  CSW discussed limited services and programs available; based on her income and the community based programs limited funds and availability. Pt does share she had been paying someone $20/hour but feels the work is not  at that level of payment.  Pt also shared that cooking is difficult for her "but I can't get meals on wheels because of my income".  CSW shared with pt about the [employer] meals program and will mail her info on this program in case she is interested in trying.  CSW also advised pt of the out of pocket cost for the meals and will discuss further with pt during follow up call in the next 2 weeks.   Pt shared with CSW that she manages her own pills; admitting to sometimes missing a pill or 2.  She also reports "spending hours getting my pillbox filled".  CSW offered to have [place] team to call her and to assess further what they can offer in the way of assistance, suggestions, etc.  CSW will place an order/consult for [place].   Pt appreciative of call and assistance.  Pt denies any depression; states she stays active with friends by playing cards, prayer group and other social activities; and is mindful of being safe and around  vaccinated people.    CSW plans follow up call in the next 2 weeks.    [name], MSW, [place] Worker  [place] [phone]

## 2020-04-18 NOTE — Patient Outreach (Signed)
Referral from [name], LCSW to [employer] - [email or web address] for Medication Assistance.

## 2020-04-26 ENCOUNTER — Ambulatory Visit: Payer: Self-pay | Admitting: *Deleted

## 2020-05-01 ENCOUNTER — Other Ambulatory Visit: Payer: Self-pay | Admitting: *Deleted

## 2020-05-02 NOTE — Patient Outreach (Signed)
[place] [facility]) Care Management  05/02/2020  Kristen Whitehead 22-Apr-1923 [id number]   CSW spoke with pt who has received but not yet had a chance to review the info mailed to her. Pt  is unsure if she received the [employer] meals info. CSW offered to mail the info to her again.  Pt shared with CSW she has a "prayer group friend" who is out of work and she is hired her to help with her cleaning.  "I can't do the big stuff and she can help once a month".  Pt declines any further needs; " I am doing well"; she has a supportive family locally as well.   CSW will sign off and update [facility] and PCP of above.   [name], MSW, [place] Worker  [place] [phone]

## 2020-05-15 ENCOUNTER — Other Ambulatory Visit: Payer: Self-pay | Admitting: *Deleted

## 2020-05-15 NOTE — Patient Outreach (Signed)
[place] [facility]) Care Management  05/15/2020  SHAUNDREA CARRIGG May 19, 1923 [id number]   Telephone Assessment  RN attempted outreach call today however unsuccessful. RN able to leave a HIPAA approved voice message requesting a call back.  Plan: Will rescheduled another outreach call over the next week.  Raina Mina, RN Care Management Coordinator [place] Office [phone]

## 2020-05-22 ENCOUNTER — Other Ambulatory Visit: Payer: Self-pay | Admitting: *Deleted

## 2020-05-22 NOTE — Patient Outreach (Signed)
[place] [facility]) Care Management  05/22/2020  Kristen Whitehead 04-Apr-1923 [id number]   Telephone Assessment-Unsuccessful  RN attempted to contact pt initial ws able to reach the pt however call was disconnected before the assessment with several return calls (verizon message). RN attempted another outreach to the mobile number however only able to leave a HIPAA approved voice message requesting a call back.  Will scheduled another outreach call next week.  Raina Mina, RN Care Management Coordinator [place] Office [phone]

## 2020-05-26 ENCOUNTER — Telehealth: Payer: Self-pay | Admitting: Cardiovascular Disease

## 2020-05-26 NOTE — Telephone Encounter (Signed)
Pt c/o swelling: STAT is pt has developed SOB within 24 hours  1) How much weight have you gained and in what time span? 3 lbs overnight   2) If swelling, where is the swelling located? Feet and Legs   3) Are you currently taking a fluid pill? Yes   4) Are you currently SOB? No   5) Do you have a log of your daily weights (if so, list)?   05/26/20 133.2 lbs  05/25/20 130 lbs  05/24/20 130 lbs  05/23/20 130 lbs  05/22/20 129 lbs  05/20/20 128 lbs  6) Have you gained 3 pounds in a day or 5 pounds in a week? 3 lbs in one day   7) Have you traveled recently? No  [place]ene request to speak with [name] in regards to this.

## 2020-05-26 NOTE — Telephone Encounter (Signed)
Discussed with Dr Oval Linsey and will have her double Lasix Saturday and Sunday, call back if no better Patient verbalized understanding

## 2020-05-29 ENCOUNTER — Encounter (INDEPENDENT_AMBULATORY_CARE_PROVIDER_SITE_OTHER): Payer: Medicare Other | Admitting: Ophthalmology

## 2020-05-29 ENCOUNTER — Other Ambulatory Visit: Payer: Self-pay

## 2020-05-29 DIAGNOSIS — H35342 Macular cyst, hole, or pseudohole, left eye: Secondary | ICD-10-CM | POA: Diagnosis not present

## 2020-05-29 DIAGNOSIS — H35033 Hypertensive retinopathy, bilateral: Secondary | ICD-10-CM

## 2020-05-29 DIAGNOSIS — I10 Essential (primary) hypertension: Secondary | ICD-10-CM | POA: Diagnosis not present

## 2020-05-29 DIAGNOSIS — H353132 Nonexudative age-related macular degeneration, bilateral, intermediate dry stage: Secondary | ICD-10-CM | POA: Diagnosis not present

## 2020-05-29 DIAGNOSIS — H43813 Vitreous degeneration, bilateral: Secondary | ICD-10-CM

## 2020-05-30 ENCOUNTER — Other Ambulatory Visit: Payer: Self-pay | Admitting: *Deleted

## 2020-05-30 NOTE — Patient Outreach (Signed)
[place] [facility]) Care Management  05/30/2020  LONNI DIRDEN 02-06-1923 [id number]   Telephone Assessment  RN spoke briefly with pt who requested a call back later today for update. Pt sleeping late this morning.  Will follow up once again later this afternoon as requested by pt.  Raina Mina, RN Care Management Coordinator [place] Office [phone]

## 2020-05-30 NOTE — Patient Outreach (Signed)
[facility]) Care Management  05/30/2020  TARALYN FERRAIOLO [birth year] [id number]  Telephone Assessment-Successful-Heart Failure  RN spoke with pt today and received an update. Pt report she continues to do well with a small amount of fluid  To her lower ankles. Reports she continue to do well with adherence to all her medications and appointments as pt continues to drive herself safely. Reports weights for today 128 lbs, yesterday 131.8 lbs and last week 130 lbs (average range 124-131 lbs throughout the week). No other symptoms other then the small amount of swelling to her ankles (history). Pt has confirmed she is aware of what to do with any ongoing swelling or other symptoms discussed related to her HF today.   Plan of care discussed along with the goals and interventions and/or any potential barriers. Will scheduled the next follow up call in November and continue communicating with the provider on pt's progress.    Goals Addressed              This Visit's Progress   .  THN-Track and Manage Fluids and Swelling        Follow Up Date 06/30/2020   - call office if I gain more than 2 pounds in one day or 5 pounds in one week - keep legs up while sitting - track weight in diary - use salt in moderation - watch for swelling in feet, ankles and legs every day - weigh myself daily    Why is this important?   It is important to check your weight daily and watch how much salt and liquids you have.  It will help you to manage your heart failure.    Notes:     .  THN-Track and Manage Symptoms        Follow Up Date 08/31/2019   - begin a heart failure diary - bring diary to all appointments - follow rescue plan if symptoms flare-up - know when to call the doctor - track symptoms and what helps feel better or worse    Why is this important?   You will be able to handle your symptoms better if you keep track of them.  Making some simple changes to your  lifestyle will help.  Eating healthy is one thing you can do to take good care of yourself.    Notes:     .  COMPLETED: To keep going (pt-stated)        CARE PLAN ENTRY (see longtitudinal plan of care for additional care plan information)   Current Barriers:  Marland Kitchen Knowledge deficit related to basic heart failure pathophysiology and self care management . Knowledge Deficits related to heart failure medications  Case Manager Clinical Goal(s):  Marland Kitchen Over the next 90 days, patient will verbalize understanding of Heart Failure Action Plan and when to call doctor . Over the next 30 days, patient will weigh daily and record (notifying MD of 3 lb weight gain over night or 5 lb in a week) . Patient will verbalize two symptoms of CHF exacerbation within the next 30 days.    Interventions:  . Basic overview and discussion of pathophysiology of Heart Failure reviewed  . Provided verbal education on low sodium diet . Advised patient to weigh each morning after emptying bladder . Discussed importance of daily weight and advised patient to weigh and record daily . Reviewed role of diuretics in prevention of fluid overload and management of heart failure  Patient Self Care Activities:  .  Weighs daily and record (notifying MD of 3 lb weight gain over night or 5 lb in a week) . Verbalizes understanding of and follows CHF Action Plan . Adheres to low sodium diet  Initial goal documentation RESOLVING DUE TO DUPLICATE GOALS       Raina Mina, RN Care Management Coordinator [place] Office [phone]

## 2020-06-02 ENCOUNTER — Telehealth: Payer: Self-pay | Admitting: Cardiovascular Disease

## 2020-06-02 NOTE — Telephone Encounter (Signed)
Spoke with patient, advised her that Dr. Oval Linsey did not recommend any extra lasix at this time given patient is feeling well and her o2 saturation is greater than 88%. Advised patient to continue her daily weights, blood pressure monitoring and oxygen saturation monitoring. Patient verbalized understanding.

## 2020-06-02 NOTE — Telephone Encounter (Signed)
Spoke with patient who states that she has gained 2 pounds over night. Patient states she is still taking her 40mg  of lasix twice daily but states her weight has recently been all over the place. Patient states that she has not gained more than 3 pounds over night or 5 pounds this week. Patient stated yesterday she was slightly more short of breath and her o2 saturation was in the high 80's but she states that her o2 saturation today has been around 94%. Patient states that her BP today is 105/50.  Patient states she feels good today, but wanted to know if Dr. Oval Linsey wanted her to take any additional lasix for her 2 pound weight gain. Advised patient that I would forward this message to Dr. Oval Linsey for review and advice.

## 2020-06-02 NOTE — Telephone Encounter (Signed)
If she is feeling well and her oxygen is >88, I wouldn't take extra lasix given that her BP is so low.

## 2020-06-02 NOTE — Telephone Encounter (Signed)
Kristen Whitehead is calling requesting to speak with [name] in regards to her struggling to gain weight. Please advise.

## 2020-06-21 ENCOUNTER — Telehealth: Payer: Self-pay | Admitting: Cardiovascular Disease

## 2020-06-21 NOTE — Telephone Encounter (Signed)
Pt c/o of Chest Pain: STAT if CP now or developed within 24 hours  1. Are you having CP right now? No   2. Are you experiencing any other symptoms (ex. SOB, nausea, vomiting, sweating)? Cold , and shaky from feeling so cold   3. How long have you been experiencing CP?  This started Saturday night 10/30 .  She stated it happened Saturday night and then last night .  She had a headache with it last night on the left side   4. Is your CP continuous or coming and going? Continuous , it started about midnight and stop about 4 am this morning   5. Have you taken Nitroglycerin? No     Called Triage ok to send message and will call pt back  ?

## 2020-06-21 NOTE — Telephone Encounter (Signed)
Attempted phone call to pt.  Left voicemail message to contact triage RN at [phone].

## 2020-06-22 ENCOUNTER — Encounter: Payer: Self-pay | Admitting: General Practice

## 2020-06-22 ENCOUNTER — Ambulatory Visit: Payer: Medicare Other | Admitting: General Practice

## 2020-06-22 ENCOUNTER — Other Ambulatory Visit: Payer: Self-pay

## 2020-06-22 VITALS — BP 114/76 | HR 65 | Ht 59.0 in | Wt 131.4 lb

## 2020-06-22 DIAGNOSIS — I482 Chronic atrial fibrillation, unspecified: Secondary | ICD-10-CM | POA: Diagnosis not present

## 2020-06-22 DIAGNOSIS — I5032 Chronic diastolic (congestive) heart failure: Secondary | ICD-10-CM | POA: Diagnosis not present

## 2020-06-22 DIAGNOSIS — I10 Essential (primary) hypertension: Secondary | ICD-10-CM | POA: Diagnosis not present

## 2020-06-22 DIAGNOSIS — K219 Gastro-esophageal reflux disease without esophagitis: Secondary | ICD-10-CM

## 2020-06-22 DIAGNOSIS — R0789 Other chest pain: Secondary | ICD-10-CM | POA: Diagnosis not present

## 2020-06-22 MED ORDER — PANTOPRAZOLE SODIUM 20 MG PO TBEC: 20.0000 mg | DELAYED_RELEASE_TABLET | Freq: Every day | ORAL | 3 refills | Status: DC

## 2020-06-22 NOTE — Progress Notes (Signed)
Cardiology Clinic Note   Patient Name: Kristen Whitehead Date of Encounter: 06/22/2020  Primary Care Provider:  Leighton Ruff, MD Primary Cardiologist:  [name], MD  Patient Kristen Whitehead 86 year old female presents the clinic today for follow-up evaluation of her chest pain and shortness of breath.  Past Medical History    Past Medical History:  Diagnosis Date   Bladder prolapse, female, acquired    Cancer [facility])    thyroid   Chest pain    no known ischemic heart disease; negative Myoview July 2013. EF 75% with no ischemia.    Chronic anticoagulation    Chronic atrial fibrillation (HCC)    managed with rate control and coumadin   Chronic diastolic heart failure (HCC)    GERD (gastroesophageal reflux disease)    Heart disease    History of congenital mitral regurgitation    mild   History of mitral valve prolapse 06/15/2008   a. echo 1/14: mild LVH, EF 60-65%, mild MR, mild to mod BAE, PASP 35   Hypercholesterolemia    Hypertension    Hypothyroidism    Past Surgical History:  Procedure Laterality Date   ABDOMINAL HYSTERECTOMY     CATARACT EXTRACTION     CHOLECYSTECTOMY     Thyroidectomy      Allergies  Allergies  Allergen Reactions   Azithromycin Nausea Only   Compazine [Prochlorperazine Edisylate] Nausea Only   Demerol Nausea Only   Dolophine [Methadone] Nausea Only   Ebastine Nausea Only    EBS   Erythromycin Nausea Only   Ibuprofen Nausea Only   Statins Other (See Comments)    myalgias   Tetanus Toxoids Nausea Only   Tetracyclines & Related Nausea Only    History of Present Illness    Kristen Whitehead has a PMH of atrial fibrillation, HTN, HLD, mild MR and AR, and chronic diastolic CHF.  She was seen 1/17 in follow-up after frequent episodes of chest pain.  She was referred for stress testing but declined.  She indicated that she had not tolerated Lexiscan well in the past.  Her troponins were  checked and were negative.  It was noted that this is been a chronic problem so further evaluation was deferred.  Her stress test in 2013 was negative for ischemia.  She indicated that she had " heart attacks" when she would overexert herself.  With these episodes she described pain across her chest which was associated with shortness of breath.  She denied associated lower extremity edema, orthopnea, or PND.  She indicated that when she has the pain she prays forgot to take it away and eventually the pain subsides.  Her Imdur was increased to 90 mg daily on 3/17.  She had an episode of heart failure 6/17 that responded to increased furosemide.  She is noted to have chronic external dyspnea.  She was seen by Jory Sims, DNP on 12/11/2016.  At that time her Lasix was increased to 60 mg in the morning milligrams in the afternoon.  There was no change noted in her symptoms.  She was seen by Jory Sims, DNP on 6/19 and her energy was worse but her blood pressure had improved.  She felt better with physical activity.  It was noted that her blood pressure was low in the morning so her Imdur was decreased to 60 mg.  She was last seen by Dr. Oval Linsey on 04/04/2020.  During that time she complained of increased edema and shortness of  breath.  At that time her Lasix was increased to 40 mg twice daily.  She reported that she generally felt well.  Her weight was stable.  She followed a low-sodium diet, her blood pressure was well controlled at home.  She denied symptoms of lightheadedness or dizziness.  She continued to have shortness of breath with increased physical activity.  She denied exertional chest pain orthopnea and PND.  She contacted nurse triage line on 06/22/2020 and indicated that she had an episode of chest discomfort while she was laying in bed 06/21/2020.  She did not try any interventions to make her chest discomfort better.  She also complained of increased lower extremity edema.  She was  instructed to double her furosemide for the next 3 days.  She presents to the clinic today for further evaluation and states that on 06/20/2020 she experienced chest discomfort while at rest.  It appeared she was laying down during this event.  She stated that she did not feel like getting up and went to sleep.  She stated that in the past when this would happen she would sit up in her recliner, pray, and the pain would go away.  She has not had any further episodes of chest discomfort.  She continues to be physically active walking and doing other types of light physical activity.  She denies exertional chest pain.  She states that she does not know if her Prilosec is working well.  She states that she has had a chronic cough as well.  I will stop her Prilosec and start her on Protonix 20 mg daily.  We will also give her the GERD diet information.  We will have her increase her physical activity as tolerated and follow-up in 3 months.  Today she denies chest pain, shortness of breath, lower extremity edema, fatigue, palpitations, melena, hematuria, hemoptysis, diaphoresis, weakness, presyncope, syncope, orthopnea, and PND.  Home Medications    Prior to Admission medications   Medication Sig Start Date End Date Taking? Authorizing Provider  amLODipine (NORVASC) 2.5 MG tablet Take 2 tablets (5 mg total) by mouth daily. 03/03/20   [name], MD  apixaban (ELIQUIS) 2.5 MG TABS tablet Take 1 tablet (2.5 mg total) by mouth 2 (two) times daily. 12/08/19   [name], MD  atenolol (TENORMIN) 50 MG tablet Take 1 tablet (50 mg total) by mouth 2 (two) times daily. 11/16/19 02/14/20  [name], MD  atenolol (TENORMIN) 50 MG tablet Take 50 mg by mouth daily.    [provider]  benzonatate (TESSALON) 100 MG capsule Take 1 capsule (100 mg total) by mouth every 8 (eight) hours as needed for cough. 12/15/19   [place] Donnamarie Poag, DO  Calcium Carbonate-Vitamin D (CALCIUM 600+D PO) Take 2 tablets  by mouth 2 (two) times daily.  Patient not taking: Reported on 05/30/2020    [provider]  Cholecalciferol (VITAMIN D) 2000 UNITS tablet Take 4,000 Units by mouth daily.     [provider]  dorzolamide (TRUSOPT) 2 % ophthalmic solution Place 1 drop into the left eye 2 (two) times daily. 09/24/19   [provider]  furosemide (LASIX) 40 MG tablet Take 1 tablet (40 mg total) by mouth 2 (two) times daily. May take an additional 40 mg as needed for swelling or weight gain 03/03/20   [name], MD  irbesartan (AVAPRO) 300 MG tablet Take 300 mg by mouth as directed. 1/2 tablet daily    [provider]  isosorbide mononitrate (  IMDUR) 30 MG 24 hr tablet Take 1 tablet (30 mg total) by mouth daily. 12/08/19   [name], MD  latanoprost (XALATAN) 0.005 % ophthalmic solution Place 1 drop into both eyes at bedtime.  02/23/13   [provider]  levothyroxine (SYNTHROID, LEVOTHROID) 100 MCG tablet Take 100 mcg by mouth daily.  04/20/15   [provider]  MAGNESIUM OXIDE 400 PO Take 800 mg by mouth daily.    [provider]  Multiple Vitamins-Minerals (CENTRUM SILVER PO) Take 1 tablet by mouth daily.    [provider]  Multiple Vitamins-Minerals (PRESERVISION AREDS 2 PO) Take 1 capsule by mouth 2 (two) times daily.     [provider]  nitroGLYCERIN (NITROSTAT) 0.4 MG SL tablet DISSOLVE 1 TABLET UNDER THE TONGUE EVERY 5 MINUTES FOR 3 DOSES AS NEEDED  FOR  CHEST  PAIN 12/23/18   [name], MD  omeprazole (PRILOSEC) 40 MG capsule TAKE ONE CAPSULE BY MOUTH EVERY DAY 03/02/20   [name], MD  potassium chloride (KLOR-CON) 10 MEQ tablet Take 1 tablet (10 mEq total) by mouth daily. 03/03/20 06/01/20  [name], MD  pramipexole (MIRAPEX) 0.125 MG tablet Take 1 tablet by mouth at bedtime. 03/28/20   [provider]  PREMARIN vaginal cream Place 2 g vaginally once a week. Uses on Sunday and wednesday  03/25/18   [provider]  Probiotic Product (PROBIOTIC DAILY PO) Take 1 tablet by mouth daily.    [provider]  sodium chloride (MURO 128) 5 % ophthalmic solution Place 1 drop into the right eye at bedtime.     [provider]  temazepam (RESTORIL) 15 MG capsule Take 1 capsule (15 mg total) by mouth at bedtime. 12/15/19 01/24/20  [place] Eric J, DO  temazepam (RESTORIL) 15 MG capsule Take 15 mg by mouth at bedtime as needed for sleep.    [provider]    Family History    Family History  Problem Relation Age of Onset   Stroke Mother    Stroke Father    Heart attack Neg Hx    Heart disease Neg Hx    She indicated that her mother is deceased. She indicated that her father is deceased. She indicated that her maternal grandmother is deceased. She indicated that her maternal grandfather is deceased. She indicated that her paternal grandmother is deceased. She indicated that her paternal grandfather is deceased. She indicated that the status of her neg hx is unknown.  Social History    Social History   Socioeconomic History   Marital status: Widowed    Spouse name: Not on file   Number of children: 3   Years of education: BA   Highest education level: Not on file  Occupational History    Employer: RETIRED  Tobacco Use   Smoking status: Never Smoker   Smokeless tobacco: Never Used  Substance and Sexual Activity   Alcohol use: No   Drug use: No   Sexual activity: Never  Other Topics Concern   Not on file  Social History Narrative   Pt lives at home alone.   Caffeine Use: quit 41yrs ago   Social Determinants of Radio broadcast assistant Strain: Low Risk    Difficulty of Paying Living Expenses: Not hard at all  Food Insecurity: No Food Insecurity   Worried About Charity fundraiser in the Last Year: Never true   Ran Out of Food in the Last Year: Never true  Transportation Needs:  No Transportation Needs   Lack of  Transportation (Medical): No   Lack of Transportation (Non-Medical): No  Physical Activity: Inactive   Days of Exercise per Week: 0 days   Minutes of Exercise per Session: 0 min  Stress:    Feeling of Stress : Not on file  Social Connections:    Frequency of Communication with Friends and Family: Not on file   Frequency of Social Gatherings with Friends and Family: Not on file   Attends Religious Services: Not on file   Active Member of Clubs or Organizations: Not on file   Attends Archivist Meetings: Not on file   Marital Status: Not on file  Intimate Partner Violence:    Fear of Current or Ex-Partner: Not on file   Emotionally Abused: Not on file   Physically Abused: Not on file   Sexually Abused: Not on file     Review of Systems    General:  No chills, fever, night sweats or weight changes.  Cardiovascular:  No chest pain, dyspnea on exertion, edema, orthopnea, palpitations, paroxysmal nocturnal dyspnea. Dermatological: No rash, lesions/masses Respiratory: No cough, dyspnea Urologic: No hematuria, dysuria Abdominal:   No nausea, vomiting, diarrhea, bright red blood per rectum, melena, or hematemesis Neurologic:  No visual changes, wkns, changes in mental status. All other systems reviewed and are otherwise negative except as noted above.  Physical Exam    VS:  BP 114/76 (BP Location: Left Arm, Patient Position: Sitting)    Pulse 65    Ht 4\' 11"  (1.499 m)    Wt 131 lb 6.4 oz (59.6 kg)    BMI 26.54 kg/m  , BMI Body mass index is 26.54 kg/m. GEN: Well nourished, well developed, in no acute distress. HEENT: normal. Neck: Supple, no JVD, carotid bruits, or masses. Cardiac: Irregularly irregular, no murmurs, rubs, or gallops. No clubbing, cyanosis, edema.  Radials/DP/PT 2+ and equal bilaterally.  Respiratory:  Respirations regular and unlabored, clear to auscultation bilaterally. GI: Soft, nontender, nondistended, BS + x 4. MS: no deformity or  atrophy. Skin: warm and dry, no rash. Neuro:  Strength and sensation are intact. Psych: Normal affect.  Accessory Clinical Findings    Recent Labs: 12/09/2019: ALT 28 12/10/2019: Hemoglobin 12.2; Platelets 186 12/11/2019: Magnesium 2.0 01/27/2020: BNP 510.7; BUN 11; Creatinine, Ser 0.82; Potassium 4.1; Sodium 140   Recent Lipid Panel    Component Value Date/Time   CHOL 211 (H) 11/04/2012 1524   TRIG 201.0 (H) 11/04/2012 1524   HDL 66.80 11/04/2012 1524   CHOLHDL 3 11/04/2012 1524   VLDL 40.2 (H) 11/04/2012 1524   LDLCALC 109 (H) 04/16/2012 1043   LDLDIRECT 98.8 11/04/2012 1524    ECG personally reviewed by me today- atrial fibrillation 65 bpm  - No acute changes  Echocardiogram 12/11/2019 IMPRESSIONS    1. Left ventricular ejection fraction, by estimation, is 70 to 75%. The  left ventricle has hyperdynamic function. The left ventricle has no  regional wall motion abnormalities. Left ventricular diastolic parameters  are indeterminate.  2. Right ventricular systolic function is normal. The right ventricular  size is normal.  3. Left atrial size was mildly dilated.  4. Right atrial size was mildly dilated.  5. The mitral valve is normal in structure. Trivial mitral valve  regurgitation. No evidence of mitral stenosis.  6. The aortic valve is tricuspid. Aortic valve regurgitation is trivial.  Mild to moderate aortic valve sclerosis/calcification is present, without  any evidence of aortic stenosis.  7. The  inferior vena cava is dilated in size with <50% respiratory  variability, suggesting right atrial pressure of 15 mmHg.   Comparison(s): No significant change from prior study. Prior images  reviewed side by side.     Assessment & Plan   1.  Chest pain/GERD-had an episode of chest pain 06/21/2020.  Pain lasted through the night and dissipated with rest.  EKG today shows a. Fib 65 bpm.  Appears to be mainly related to reflux/GI. Continue amlodipine, atenolol, and  irbesartan, Imdur, nitroglycerin, Stop omeprazole Start protonix Heart healthy low-sodium diet-salty 6 given Increase physical activity as tolerated GERD diet information given  Chronic diastolic CHF-weight today 131 pounds .  Bilateral +1 lower extremity edema. Continue increased furosemide Continue furosemide, irbesartan, atenolol Heart healthy low-sodium diet-salty 6 given Increase physical activity as tolerated Order BMP in 1 week.  Essential hypertension-BP today 114/76.  Well-controlled at home. Continue irbesartan amlodipine, atenolol, Imdur Heart healthy low-sodium diet-salty 6 given Increase physical activity as tolerated  Chronic atrial fibrillation-heart rate 65.  CHA2DS2-VASc score 4.  (Hypertension, female, age 3) Continue apixaban, atenolol Heart healthy low-sodium diet-salty 6 given Increase physical activity as tolerated   Disposition: Follow-up with Dr. Oval Linsey or me in 3 months.  Jossie Ng. Anaalicia Reimann NP-C    06/22/2020, 3:14 PM [address] Office [phone] Fax [phone]  Notice: This dictation was prepared with Dragon dictation along with smaller phrase technology. Any transcriptional errors that result from this process are unintentional and may not be corrected upon review.

## 2020-06-22 NOTE — Telephone Encounter (Signed)
Patient returning call.

## 2020-06-22 NOTE — Telephone Encounter (Signed)
Spoke with pt, she reports Saturday night she developed chills, chest pain, headache and wa unable to walk. She reports it lasted all night and she finally fell asleep at 4 am. She felt fine the next day. The chest pain was in the center of her chest and to the left side. It went up into her head, ear and throat. She just laid in bed so is not sure if anything would have helped or make the pain worse. She reports her weight is up 2 lbs overnight and she reports swelling in her feet and ankles. She reports SOB with activity like getting in and out of bed. She reports SOB when she first lays down but it will go away. Advised the patient that she can double her furosemide for 3 days to see if that will help with her SOB. She is asking for advise on if she is okay or not because of the chest pain. Scheduled patient to be seen today by the NP.

## 2020-06-22 NOTE — Patient Instructions (Signed)
Medication Instructions:  STOP OMEPRAZOLE  START PANTOPRAZOLE 20MG  DAILY *If you need a refill on your cardiac medications before your next appointment, please call your pharmacy*  Lab Work:   Testing/Procedures:  NONE    NONE  Special Instructions  PLEASE READ AND FOLLOW GERD DIET-ATTACHED  Follow-Up: Your next appointment:  3 month(s) In Person with Kristen Latch, MD OR IF UNAVAILABLE [name], FNP-C  At [facility], you and your health needs are our priority.  As part of our continuing mission to provide you with exceptional heart care, we have created designated Provider Care Teams.  These Care Teams include your primary Cardiologist (physician) and Advanced Practice Providers (APPs -  Physician Assistants and Nurse Practitioners) who all work together to provide you with the care you need, when you need it.    Food Choices for Gastroesophageal Reflux Disease, Adult When you have gastroesophageal reflux disease (GERD), the foods you eat and your eating habits are very important. Choosing the right foods can help ease your discomfort. Think about working with a nutrition specialist (dietitian) to help you make good choices. What are tips for following this plan?  Meals  Choose healthy foods that are low in fat, such as fruits, vegetables, whole grains, low-fat dairy products, and lean meat, fish, and poultry.  Eat small meals often instead of 3 large meals a day. Eat your meals slowly, and in a place where you are relaxed. Avoid bending over or lying down until 2-3 hours after eating.  Avoid eating meals 2-3 hours before bed.  Avoid drinking a lot of liquid with meals.  Cook foods using methods other than frying. Bake, grill, or broil food instead.  Avoid or limit: ? Chocolate. ? Peppermint or spearmint. ? Alcohol. ? Pepper. ? Black and decaffeinated coffee. ? Black and decaffeinated tea. ? Bubbly (carbonated) soft drinks. ? Caffeinated energy drinks and soft  drinks.  Limit high-fat foods such as: ? Fatty meat or fried foods. ? Whole milk, cream, butter, or ice cream. ? Nuts and nut butters. ? Pastries, donuts, and sweets made with butter or shortening.  Avoid foods that cause symptoms. These foods may be different for everyone. Common foods that cause symptoms include: ? Tomatoes. ? Oranges, lemons, and limes. ? Peppers. ? Spicy food. ? Onions and garlic. ? Vinegar. Lifestyle  Maintain a healthy weight. Ask your doctor what weight is healthy for you. If you need to lose weight, work with your doctor to do so safely.  Exercise for at least 30 minutes for 5 or more days each week, or as told by your doctor.  Wear loose-fitting clothes.  Do not smoke. If you need help quitting, ask your doctor.  Sleep with the head of your bed higher than your feet. Use a wedge under the mattress or blocks under the bed frame to raise the head of the bed. Summary  When you have gastroesophageal reflux disease (GERD), food and lifestyle choices are very important in easing your symptoms.  Eat small meals often instead of 3 large meals a day. Eat your meals slowly, and in a place where you are relaxed.  Limit high-fat foods such as fatty meat or fried foods.  Avoid bending over or lying down until 2-3 hours after eating.  Avoid peppermint and spearmint, caffeine, alcohol, and chocolate. This information is not intended to replace advice given to you by your health care provider. Make sure you discuss any questions you have with your health care provider.

## 2020-06-30 ENCOUNTER — Other Ambulatory Visit: Payer: Self-pay | Admitting: *Deleted

## 2020-06-30 NOTE — Patient Outreach (Signed)
[facility]rist[facility] Whitehead) Care Management  06/30/2020  Kristen Whitehead 05-Apr-1923 [id number]  Telephone Assessment-Successful-Heart Failure  RN spoke with pt today who provided an update on her ongoing management of care. Reports weights for today at 129 lbs, yesterday 128 lbs and one week ago at 127 lbs however states she has some swelling to her LE but will wear her compression stocking today and elevate her LE throughout the day. Reports breathing exercises in the morning upon arising to stay in the 90's and aware to take rest periods with any activities to avoid acute SOB. No acute symptoms mentioned as pt continue to be on track with the discussed plan of care along with all goals and interventions related to her oxygen levels or HF symptoms.  Pt reports a new provider Benjiman Core, PA with [facility]. Will update in Epic and send barriers letter today on pt's disposition with [facility] services.  No request or inquires at this time as pt informed to continue managing her care with the provided tools and DME (pulse ox). Pt aware of acute readings and when she would need to contact her provider or interventions. Care plan updated accordingly with notes. Due to pt's fragile state and living alone with limited help will continue to assist with ongoing case management needs at this time. Will follow up next month. Goals Addressed            This Visit's Progress   . THN-Track and Manage Fluids and Swelling   On track    Follow Up Date 08/17/2020   - call office if I gain more than 2 pounds in one day or 5 pounds in one week - keep legs up while sitting - track weight in diary - use salt in moderation - watch for swelling in feet, ankles and legs every day - weigh myself daily    Why is this important?   It is important to check your weight daily and watch how much salt and liquids you have.  It will help you to manage your heart failure.    Notes: Reports weights with no minimal fluid  retention "up and down". Reports some LLE swelling at a minimal however pt wears her compression stockings and aware to elevate her LE.     . THN-Track and Manage Symptoms   On track    Follow Up Date 08/31/2019   - begin a heart failure diary - bring diary to all appointments - follow rescue plan if symptoms flare-up - know when to call the doctor - track symptoms and what helps feel better or worse    Why is this important?   You will be able to handle your symptoms better if you keep track of them.  Making some simple changes to your lifestyle will help.  Eating healthy is one thing you can do to take good care of yourself.    Notes: Review the above symptoms and what to do by contacting her provider.       Kristen Mina, RN Care Management Coordinator [place] Office [phone]

## 2020-07-20 ENCOUNTER — Other Ambulatory Visit: Payer: Self-pay | Admitting: Cardiovascular Disease

## 2020-08-02 ENCOUNTER — Other Ambulatory Visit: Payer: Self-pay | Admitting: *Deleted

## 2020-08-02 NOTE — Patient Outreach (Signed)
[facility] [facility]) Care Management  08/02/2020  Kristen Whitehead [birth year] [id number]  Telephone Assessment-Successful  Spoke with pt today and received an update on her ongoing management pf care. Pt reports she continues to do well with no acute issues over the last month. Reports ongoing follow up appointments and sufficient supply of medications. Pt continue to do well and denies any swelling or issues related to her HF. States "alittle bit of weight gained over night but it was no 3 lbs.Pt will watch and follow up with her provider if needed tomorrow if not reduced.  Plan of care reviewed and discussed along with all goals and interventions. Will continue to encouraged adherence with daily weights and offer new [facility] calendar for documenting all her weights (pt declined) and indicated she uses a smaller book to document her readings. No acute issues to further address at this time as pt aware of who to call if acute issues occur. No additional inquires or request at this time.   Will scheduled a late appointment next month as requested for ongoing follow up.  Goals Addressed            This Visit's Progress   . THN-Track and Manage Fluids and Swelling   On track    Follow Up Date 09/01/2020 Timeframe:  Short-Term Goal Priority:  Medium Start Date:     08/02/2020                        Expected End Date:   09/01/2020                      - call office if I gain more than 2 pounds in one day or 5 pounds in one week - keep legs up while sitting - track weight in diary - use salt in moderation - watch for swelling in feet, ankles and legs every day - weigh myself daily    Why is this important?   It is important to check your weight daily and watch how much salt and liquids you have.  It will help you to manage your heart failure.    Notes: Today weight 129.4 lbs, Yesterday 127.8 lbs and last week 127.2 lbs and pt remains asymptomatic with no swelling.     .  THN-Track and Manage Symptoms   On track    Follow Up Date 09/01/2020  Timeframe:  Long-Range Goal Priority:  Medium Start Date:    08/02/2020                         Expected End Date:   09/01/2020                    - begin a heart failure diary - bring diary to all appointments - follow rescue plan if symptoms flare-up - know when to call the doctor - track symptoms and what helps feel better or worse    Why is this important?   You will be able to handle your symptoms better if you keep track of them.  Making some simple changes to your lifestyle will help.  Eating healthy is one thing you can do to take good care of yourself.    Notes: Review the above symptoms and what to do by contacting her provider. Will verify pt remains in the GREEN zone with no acute symptoms.  Raina Mina, RN Care Management Coordinator [place] Office [phone]

## 2020-08-02 NOTE — Patient Outreach (Signed)
[facility] [facility]) Care Management  08/02/2020  ERI MCEVERS 04-Apr-1923 [id number]   Telephone Assessment   Pt has requested a call back later today for updates on her ongoing management of care.  Will follow up later today has requested for ongoing [facility] services.  Raina Mina, RN Care Management Coordinator [place] Office [phone]

## 2020-08-22 DIAGNOSIS — E890 Postprocedural hypothyroidism: Secondary | ICD-10-CM | POA: Diagnosis not present

## 2020-08-22 DIAGNOSIS — I10 Essential (primary) hypertension: Secondary | ICD-10-CM | POA: Diagnosis not present

## 2020-08-22 DIAGNOSIS — K219 Gastro-esophageal reflux disease without esophagitis: Secondary | ICD-10-CM | POA: Diagnosis not present

## 2020-08-22 DIAGNOSIS — I5032 Chronic diastolic (congestive) heart failure: Secondary | ICD-10-CM | POA: Diagnosis not present

## 2020-08-22 DIAGNOSIS — M810 Age-related osteoporosis without current pathological fracture: Secondary | ICD-10-CM | POA: Diagnosis not present

## 2020-08-22 DIAGNOSIS — E039 Hypothyroidism, unspecified: Secondary | ICD-10-CM | POA: Diagnosis not present

## 2020-08-22 DIAGNOSIS — H4089 Other specified glaucoma: Secondary | ICD-10-CM | POA: Diagnosis not present

## 2020-08-22 DIAGNOSIS — E785 Hyperlipidemia, unspecified: Secondary | ICD-10-CM | POA: Diagnosis not present

## 2020-09-01 ENCOUNTER — Other Ambulatory Visit: Payer: Self-pay | Admitting: *Deleted

## 2020-09-01 NOTE — Patient Outreach (Signed)
[facility]) Care Management  09/01/2020  Kristen Whitehead 11-10-1922 [id number]   Telephone Assessment-Unsuccessful  RN attempted outreach today however unsuccessful. RN able to leave a HIPAA approved voice message requesting a call back.   Will further engage with an update at that time.  Raina Mina, RN Care Management Coordinator [place] Office [phone]

## 2020-09-06 ENCOUNTER — Other Ambulatory Visit: Payer: Self-pay | Admitting: *Deleted

## 2020-09-06 NOTE — Patient Outreach (Signed)
[facility]) Care Management  09/06/2020  Kristen Whitehead [birth year] [id number]  Requested a call back Telephone Assessment  Pt requested a call back on 1/25 afternoon. Will follow up next week as requested for updates on pt's ongoing plan of care. Pt states she has visitors out of town and she really wants to enjoy her company. Pt very excited that her daughters are visiting.  Will follow up next week on there chosen day and time to follow up with this pt. Will update the plan of care at that time.  Raina Mina, RN Care Management Coordinator [place] Office [phone]

## 2020-09-12 ENCOUNTER — Other Ambulatory Visit: Payer: Self-pay | Admitting: *Deleted

## 2020-09-12 NOTE — Patient Outreach (Signed)
[place]) Care Management  09/12/2020  MCKINSEY KEAGLE Sep 18, 1922 [id number]   Telephone Assessment-Successful-Heart Failure  RN spoke with pt today and received an update on her ongoing management of care. Plan of care discussed with updated goals and ongoing interventions. Pt reports she is doing well with no acute symptoms related to her heart failure. Weights documented accordingly and pt continue to be adherent with the current plan of care. Pt verbalized an understanding of all discussed today and all questions were answered accordingly. Pt reports SOB incident that was resolved after resting with pursed lip breathing technique and her sats increased to 96%.  Pt continue to progress toward managing her care resulting in avoidance of admissions and urgent care visits. Pt continues to be careful to avoid environmental factors that may increase her risk for covid. Pt educated accordingly.  No additional needs or issues mentioned as pt continue to be on track with her ongoing management of care. Will continue to communicate with pt's provider on pt's progress and disposition with [facility] services.  Goals Addressed            This Visit's Progress   . THN-Learn More About My Health       Follow Up Date 10/13/2020 Timeframe:  Long-Range Goal Priority:  Medium Start Date:     09/12/2020                        Expected End Date:  12/15/2020                   - tell my story and reason for my visit - make a list of questions - ask questions - repeat what I heard to make sure I understand - speak up when I don't understand    Why is this important?    The best way to learn about your health and care is by talking to the doctor and nurse.   They will answer your questions and give you information in the way that you like best.    Notes:   1/25-Will discuss pt's ongoing medical issues and how her medical conditions can affect her health. Always encourage pt on  when to seek medical attention with her ongoing medical issues and encountered symptoms.    . THN-Track and Manage Fluids and Swelling   On track    Follow Up Date 10/13/2020 Timeframe:  Short-Term Goal Priority:  Medium Start Date:     08/02/2020                        Expected End Date:   10/16/2020                   - call office if I gain more than 2 pounds in one day or 5 pounds in one week - keep legs up while sitting - track weight in diary - use salt in moderation - watch for swelling in feet, ankles and legs every day - weigh myself daily    Why is this important?   It is important to check your weight daily and watch how much salt and liquids you have.  It will help you to manage your heart failure.    Notes:  09/12/2020- Pt reports weights as followed: Today 128.8 lbs, Yesterday 129.6 lbs and last week at 131.4 lbs. Pt remains in the GREEN zone and remains symptoms free with no acute  problems. Discussed baseline weights and ongoing monitoring system for prevention.  08/02/2020-Today weight 129.4 lbs, Yesterday 127.8 lbs and last week 127.2 lbs and pt remains asymptomatic with no swelling.     . THN-Track and Manage Symptoms   On track    Follow Up Date 10/13/2020  Timeframe:  Long-Range Goal Priority:  Medium Start Date:    08/02/2020                         Expected End Date:   12/15/2020                 - begin a heart failure diary - bring diary to all appointments - follow rescue plan if symptoms flare-up - know when to call the doctor - track symptoms and what helps feel better or worse    Why is this important?   You will be able to handle your symptoms better if you keep track of them.  Making some simple changes to your lifestyle will help.  Eating healthy is one thing you can do to take good care of yourself.    Notes:   09/12/2020-Verified pt remains in the GREEN zone with no acute symptoms. Will continue to offer [facility] calendar for ongoing documentation of  all weights and/or symptoms.  Discuss the sones and what to do if acute symptoms occur.Will also alert pt on when to contact her provider with symptoms.   08/02/2020-Review the above symptoms and what to do by contacting her provider. Will verify pt remains in the GREEN zone with no acute symptoms.       Raina Mina, RN Care Management Coordinator [place] Office [phone]

## 2020-10-04 ENCOUNTER — Other Ambulatory Visit: Payer: Self-pay

## 2020-10-04 ENCOUNTER — Encounter: Payer: Self-pay | Admitting: Cardiovascular Disease

## 2020-10-04 ENCOUNTER — Ambulatory Visit: Payer: Medicare Other | Admitting: Cardiovascular Disease

## 2020-10-04 VITALS — BP 114/62 | HR 67 | Ht 59.5 in | Wt 133.4 lb

## 2020-10-04 DIAGNOSIS — I480 Paroxysmal atrial fibrillation: Secondary | ICD-10-CM | POA: Diagnosis not present

## 2020-10-04 DIAGNOSIS — I5033 Acute on chronic diastolic (congestive) heart failure: Secondary | ICD-10-CM | POA: Diagnosis not present

## 2020-10-04 DIAGNOSIS — R0600 Dyspnea, unspecified: Secondary | ICD-10-CM

## 2020-10-04 DIAGNOSIS — R0609 Other forms of dyspnea: Secondary | ICD-10-CM

## 2020-10-04 DIAGNOSIS — I340 Nonrheumatic mitral (valve) insufficiency: Secondary | ICD-10-CM | POA: Diagnosis not present

## 2020-10-04 DIAGNOSIS — I10 Essential (primary) hypertension: Secondary | ICD-10-CM | POA: Diagnosis not present

## 2020-10-04 DIAGNOSIS — E7800 Pure hypercholesterolemia, unspecified: Secondary | ICD-10-CM | POA: Diagnosis not present

## 2020-10-04 NOTE — Patient Instructions (Addendum)
Medication Instructions:  TAKE FUROSEMIDE 80 MG IN THE MORNING AND 40 MG IN THE AFTERNOON FOR 3 DAYS ONLY THEN RESUME YOUR REGULAR DOSE  *If you need a refill on your cardiac medications before your next appointment, please call your pharmacy*  Lab Work: NONE  Testing/Procedures: NONE  Follow-Up: At [facility], you and your health needs are our priority.  As part of our continuing mission to provide you with exceptional heart care, we have created designated Provider Care Teams.  These Care Teams include your primary Cardiologist (physician) and Advanced Practice Providers (APPs -  Physician Assistants and Nurse Practitioners) who all work together to provide you with the care you need, when you need it.  We recommend signing up for the patient portal called "MyChart".  Sign up information is provided on this After Visit Summary.  MyChart is used to connect with patients for Virtual Visits (Telemedicine).  Patients are able to view lab/test results, encounter notes, upcoming appointments, etc.  Non-urgent messages can be sent to your provider as well.   To learn more about what you can do with MyChart, go to [email or web address].    Your next appointment:   2 month(s)  The format for your next appointment:   In Person  Provider:   You may see [name], MD or one of the following Advanced Practice Providers on your designated Care Team:    Kerin Ransom, PA-C  [place], [place]  Coletta Memos, [place]   Other Instructions  [place]. IF YOU DO NOT HEAR FROM THEM YOU CAN CALL THEM DIRECTLY AT NUMBER LISTED    Where: [facility] Address: [address] [place] [address] Phone: [phone]

## 2020-10-04 NOTE — Progress Notes (Signed)
Cardiology Office Note   Date:  10/04/2020   ID:  Kristen Whitehead April 07, 1923, MRN [id number]  PCP:  Kristen Jenkins, PA  Cardiologist:   Kristen Latch, MD   No chief complaint on file.    History of Present Illness: Kristen Whitehead is a 87 y.o. female with chronic atrial fibrillation, hypertension, hyperlipidemia, mild MR and AR and chronic diastolic heart failure who presents for follow up. Kristen Whitehead was previously a patient of Dr. Mare Whitehead. She last saw him 08/2015 for follow-up on frequent episodes of chest pain. She had been referred for stress testing but declined, as she has not tolerated Lexiscan well in the past. Troponin was checked and was negative. This is been a chronic problem so further evaluation was deferred. Her stress test in 2013 was negative for ischemia. She reports having "heart attacks" that occur when she overexerts herself. When this happens she gets pain across her chest that is associated with shortness of breath. She denies lower extremity edema, orthopnea or PND. She watched a documentary on broken heart syndrome and thinks this must be what happens to her. When she gets the pain she prays for God to take it away and eventually the pain subsides. There is no associated nausea or diaphoresis. Imdur was increased to 90 mg daily on 10/2015. Since then she had an episode of heart failure 01/2016 that responded to increased lasix. She also has chronic exertional dyspnea.  Kristen Whitehead, [name], on 12/11/16. Lasix was increased to 60 mg in the morning and 40 in the afternoon. There was no change in her symptoms.  She followed up with Kristen Flake, DNP on 01/2018 and her energy was worse but her BP was better. She felt better with physical activity. Her BP was low in the morning so Imdur was decreased to 60mg  and she started taking it in the morning instead of the afternoon. At her last appointment her blood pressure  was elevated so amlodipine was increased.  Since then her blood pressures been much better controlled.  She has struggled with losing strength over the last year.  She feels that the pandemic really set her back.  She had a fall several months ago.  She did some physical therapy but has not fully regained her strength.  She is using a cane for balance.  She is not getting as much exercise as she used to.  She was walking better prior to the fall and does not feel that she is ever fully recovered.  She has no chest pain other than GERD.  It was better controlled on ranitidine.  At her last appointment irbesartan was reduced due to low blood pressure.  She has been feeling well.  She notes that she has been forgetting some things lately.  She isn't getting much exercise due to the cold.  She has no chest pain and her breathing is stable.  She gets short of breath when she exerts or when she first gets into bed. She notes that her oxygen levels are sometimes low when she checks it first thing in the AM.  Her BP has been very stable in the 110-120s.  She has rare lightheadedness that she notes when she is dehydrated.  She is still driving.  She continues to limit sodium intake, though she had [place] yesterday and her weight was up 1.5 lb.  Other concerns:  L neck pain into L arm- none in the last month.  Prayed  and it went away Hands cold Memory loss Back pain   Past Medical History:  Diagnosis Date  . Bladder prolapse, female, acquired   . Cancer ([place])    thyroid  . Chest pain    no known ischemic heart disease; negative Myoview July 2013. EF 75% with no ischemia.   . Chronic anticoagulation   . Chronic atrial fibrillation (HCC)    managed with rate control and coumadin  . Chronic diastolic heart failure ([place])   . GERD (gastroesophageal reflux disease)   . Heart disease   . History of congenital mitral regurgitation    mild  . History of mitral valve prolapse 06/15/2008   a. echo 1/14: mild  LVH, EF 60-65%, mild MR, mild to mod BAE, PASP 35  . Hypercholesterolemia   . Hypertension   . Hypothyroidism     Past Surgical History:  Procedure Laterality Date  . ABDOMINAL HYSTERECTOMY    . CATARACT EXTRACTION    . CHOLECYSTECTOMY    . Thyroidectomy       Current Outpatient Medications  Medication Sig Dispense Refill  . amLODipine (NORVASC) 2.5 MG tablet Take 2 tablets (5 mg total) by mouth daily. 60 tablet 11  . apixaban (ELIQUIS) 2.5 MG TABS tablet Take 1 tablet (2.5 mg total) by mouth 2 (two) times daily. 180 tablet 3  . atenolol (TENORMIN) 50 MG tablet Take 50 mg by mouth daily.    . benzonatate (TESSALON) 100 MG capsule Take 1 capsule (100 mg total) by mouth every 8 (eight) hours as needed for cough. 21 capsule 0  . Calcium Carbonate-Vitamin D (CALCIUM 600+D PO) Take 2 tablets by mouth 2 (two) times daily.     . Cholecalciferol (VITAMIN D) 2000 UNITS tablet Take 4,000 Units by mouth daily.    . dorzolamide (TRUSOPT) 2 % ophthalmic solution Place 1 drop into the left eye 2 (two) times daily.    . furosemide (LASIX) 40 MG tablet Take 1 tablet (40 mg total) by mouth 2 (two) times daily. May take an additional 40 mg as needed for swelling or weight gain 195 tablet 3  . irbesartan (AVAPRO) 300 MG tablet Take 300 mg by mouth as directed. 1/2 tablet daily    . isosorbide mononitrate (IMDUR) 30 MG 24 hr tablet TAKE 1 TABLET BY MOUTH  DAILY 90 tablet 3  . latanoprost (XALATAN) 0.005 % ophthalmic solution Place 1 drop into both eyes at bedtime.     Marland Kitchen levothyroxine (SYNTHROID, LEVOTHROID) 100 MCG tablet Take 100 mcg by mouth daily.     Marland Kitchen MAGNESIUM OXIDE 400 PO Take 800 mg by mouth daily.    . Multiple Vitamins-Minerals (CENTRUM SILVER PO) Take 1 tablet by mouth daily.    . Multiple Vitamins-Minerals (PRESERVISION AREDS 2 PO) Take 1 capsule by mouth 2 (two) times daily.     . nitroGLYCERIN (NITROSTAT) 0.4 MG SL tablet DISSOLVE 1 TABLET UNDER THE TONGUE EVERY 5 MINUTES FOR 3 DOSES AS  NEEDED  FOR  CHEST  PAIN 50 tablet 2  . pramipexole (MIRAPEX) 0.125 MG tablet Take 1 tablet by mouth at bedtime.    Marland Kitchen PREMARIN vaginal cream Place 2 g vaginally once a week. Uses on Sunday and wednesday    . Probiotic Product (PROBIOTIC DAILY PO) Take 1 tablet by mouth daily.    . sodium chloride (MURO 128) 5 % ophthalmic solution Place 1 drop into the right eye at bedtime.     . temazepam (RESTORIL) 15 MG capsule Take 15  mg by mouth at bedtime as needed for sleep.    . pantoprazole (PROTONIX) 20 MG tablet Take 1 tablet (20 mg total) by mouth daily. 30 tablet 3  . potassium chloride (KLOR-CON) 10 MEQ tablet Take 1 tablet (10 mEq total) by mouth daily. 90 tablet 3  . temazepam (RESTORIL) 15 MG capsule Take 1 capsule (15 mg total) by mouth at bedtime. 30 capsule 0   No current facility-administered medications for this visit.    Allergies:   Azithromycin, Compazine [prochlorperazine edisylate], Demerol, Dolophine [methadone], Ebastine, Erythromycin, Ibuprofen, Statins, Tetanus toxoids, and Tetracyclines & related    Social History:  The patient  reports that she has never smoked. She has never used smokeless tobacco. She reports that she does not drink alcohol and does not use drugs.   Family History:  The patient's family history includes Stroke in her father and mother.   ROS:  Please see the history of present illness.   Otherwise, review of systems are positive for none.   All other systems are reviewed and negative.    PHYSICAL EXAM: VS:  BP 114/62   Pulse 67   Ht 4' 11.5" (1.511 m)   Wt 133 lb 6.4 oz (60.5 kg)   SpO2 98%   BMI 26.49 kg/m  , BMI Body mass index is 26.49 kg/m. GENERAL:  Well appearing HEENT: Pupils equal round and reactive, fundi not visualized, oral mucosa unremarkable NECK: JVP 4 cm above clavicle at 45 degrees, waveform within normal limits, carotid upstroke brisk and symmetric, no bruits LUNGS:  Clear to auscultation bilaterally HEART:  Irregularly  irregular.  PMI not displaced or sustained,S1 and S2 within normal limits, no S3, no S4, no clicks, no rubs, II/VI holosystolic murmur at the apex ABD:  Flat, positive bowel sounds normal in frequency in pitch, no bruits, no rebound, no guarding, no midline pulsatile mass, no hepatomegaly, no splenomegaly EXT:  2 plus pulses throughout, 2+ bilateraly LE edema no cyanosis no clubbing SKIN:  No rashes no nodules NEURO:  Cranial nerves II through XII grossly intact, motor grossly intact throughout PSYCH:  Cognitively intact, oriented to person place and time   EKG:  EKG is ordered today. 05/06/16: Atrial fibrillation rate 72 bpm 06/15/19: Atrial fibrillation.  Rate 72 bpm.   10/04/20: Atrial fibrillation.  Rate 67 bpm.    Echo 12/11/19:  1. Left ventricular ejection fraction, by estimation, is 70 to 75%. The  left ventricle has hyperdynamic function. The left ventricle has no  regional wall motion abnormalities. Left ventricular diastolic parameters  are indeterminate.  2. Right ventricular systolic function is normal. The right ventricular  size is normal.  3. Left atrial size was mildly dilated.  4. Right atrial size was mildly dilated.  5. The mitral valve is normal in structure. Trivial mitral valve  regurgitation. No evidence of mitral stenosis.  6. The aortic valve is tricuspid. Aortic valve regurgitation is trivial.  Mild to moderate aortic valve sclerosis/calcification is present, without  any evidence of aortic stenosis.  7. The inferior vena cava is dilated in size with <50% respiratory  variability, suggesting right atrial pressure of 15 mmHg.   Recent Labs: 12/09/2019: ALT 28 12/10/2019: Hemoglobin 12.2; Platelets 186 12/11/2019: Magnesium 2.0 01/27/2020: BNP 510.7; BUN 11; Creatinine, Ser 0.82; Potassium 4.1; Sodium 140    Lipid Panel    Component Value Date/Time   CHOL 211 (H) 11/04/2012 1524   TRIG 201.0 (H) 11/04/2012 1524   HDL 66.80 11/04/2012 1524  CHOLHDL  3 11/04/2012 1524   VLDL 40.2 (H) 11/04/2012 1524   LDLCALC 109 (H) 04/16/2012 1043   LDLDIRECT 98.8 11/04/2012 1524   03/28/16: Sodium 136, potassium 4.1, BUN 17, creatinine 0.74 AST 24, ALT 17 WBC 10, hemoglobin 15.7, hematocrit 46.8, platelets 291  07/21/15: TSH 1.52, free T4 1 0.25  04/13/15: Total cholesterol 235, triglycerides 150, HDL 66, LDL 139  03/08/15: BNP 601  Wt Readings from Last 3 Encounters:  10/04/20 133 lb 6.4 oz (60.5 kg)  06/22/20 131 lb 6.4 oz (59.6 kg)  04/04/20 131 lb (59.4 kg)      ASSESSMENT AND PLAN:  # Hypertension: BPis well-controlled.  It is low today.  She is asymptomatic.  Continue amlodipine, atenolol, irbesartan and furosemide.  # Chest pain: Resolved. Continue omeprazole for GERD.  # Memory loss: Will have her see geriatrics.  Reports that neurology told her memory loss is expected for age.  # Chronic atrial fibrillation: Rate remainswell-controlled. Continue atenolol and Eliquis.  She is on Eliquis to 2.5 mg given her weight less than 60 kg and age. This patients CHA2DS2-VASc Score and unadjusted Ischemic Stroke Rate (% per year) is equal to 4.8 % stroke rate/year from a score of 4  Above score calculated as 1 point each if present [CHF, HTN, DM, Vascular=MI/PAD/Aortic Plaque, Age if 65-74, or Female] Above score calculated as 2 points each if present [Age >75, or Stroke/TIA/TE]  #Acute on chronic diastolic heart failure:  Mildly volume overloaded.  Increase lasix to 80mg  in the am and 40mg  in the evening for three days.    # Hyperlipidemia: LDL was 68 on 06/2018    Current medicines are reviewed at length with the patient today.  The patient does not have concerns regarding medicines.  The following changes have been made:  none  Labs/ tests ordered today include:   Orders Placed This Encounter  Procedures  . Ambulatory referral to Geriatrics  . EKG 12-Lead     Disposition:   FU with Adwoa Axe C. Oval Linsey, MD,  [facility] in 3 months.     Signed, Malone Vanblarcom C. Oval Linsey, MD, [facility]  10/04/2020 5:11 PM    [place]

## 2020-10-06 ENCOUNTER — Ambulatory Visit: Payer: Medicare Other | Admitting: Cardiovascular Disease

## 2020-10-13 ENCOUNTER — Encounter: Payer: Self-pay | Admitting: *Deleted

## 2020-10-13 ENCOUNTER — Other Ambulatory Visit: Payer: Self-pay | Admitting: *Deleted

## 2020-10-13 NOTE — Patient Outreach (Signed)
[facility]) Care Management  10/13/2020  Kristen Whitehead [birth year] [id number]   Telephone Assessment-Successful   RN spoke with pt today and received un update on pt's ongoing management of care. Pt reports episode with some fluid retention however resolved with added medication approved by her provider. Pt had great communicate with her provider for intervention on this matter.  Care plan reviewed and discussed in detail with notations and updates.   Verified pt is aware once again of all goals and what to do if acute symptoms occur. Pt continue to discuss her efforts for disease prevention with any flare ups and excited about her upcoming birthday next month.  No other issues or barriers to discussed as pt will continue to manage her ongoing HF with daily monitoring.   Will follow up next month and update pt's provider accordingly on pt's disposition with [facility] services.  Goals Addressed            This Visit's Progress   . THN-Learn More About My Health   On track    Follow Up Date 11/10/2020 Timeframe:  Long-Range Goal Priority:  Medium Start Date:     09/12/2020                        Expected End Date:  12/15/2020                   - tell my story and reason for my visit - make a list of questions - ask questions - repeat what I heard to make sure I understand - speak up when I don't understand    Why is this important?    The best way to learn about your health and care is by talking to the doctor and nurse.   They will answer your questions and give you information in the way that you like best.    Notes:   1/25-Will discuss pt's ongoing medical issues and how her medical conditions can affect her health. Always encourage pt on when to seek medical attention with her ongoing medical issues and encountered symptoms.    . THN-Track and Manage Fluids and Swelling   On track    Follow Up Date 11/10/2020 Timeframe:  Short-Term Goal Priority:   Medium Start Date:     08/02/2020                        Expected End Date:   12/15/2020                   - call office if I gain more than 2 pounds in one day or 5 pounds in one week - keep legs up while sitting - track weight in diary - use salt in moderation - watch for swelling in feet, ankles and legs every day - weigh myself daily    Why is this important?   It is important to check your weight daily and watch how much salt and liquids you have.  It will help you to manage your heart failure.    Notes:  2/25-Reports some fluid retention where her provided added additional medications for a few days until back at baseline. No additional symptoms as pt remains on track at this time. Will continue to encouraged ongoing management of care. 09/12/2020- Pt reports weights as followed: Today 128.8 lbs, Yesterday 129.6 lbs and last week at 131.4 lbs. Pt remains in  the GREEN zone and remains symptoms free with no acute problems. Discussed baseline weights and ongoing monitoring system for prevention.  08/02/2020-Today weight 129.4 lbs, Yesterday 127.8 lbs and last week 127.2 lbs and pt remains asymptomatic with no swelling.     . THN-Track and Manage Symptoms   On track    Follow Up Date 11/10/2020  Timeframe:  Long-Range Goal Priority:  Medium Start Date:    08/02/2020                         Expected End Date:   12/15/2020                  - begin a heart failure diary - bring diary to all appointments - follow rescue plan if symptoms flare-up - know when to call the doctor - track symptoms and what helps feel better or worse    Why is this important?   You will be able to handle your symptoms better if you keep track of them.  Making some simple changes to your lifestyle will help.  Eating healthy is one thing you can do to take good care of yourself.    Notes:  2/25-Pt verifies she remains in the GREEN zone at this time however episode with 3 lbs where her MD intervened with  added medications until she returned to her baseline weight. This AM 127 lbs and yesterday 128.4 lbs with no additional symptoms.   09/12/2020-Verified pt remains in the GREEN zone with no acute symptoms. Will continue to offer [facility] calendar for ongoing documentation of all weights and/or symptoms.  Discuss the sones and what to do if acute symptoms occur.Will also alert pt on when to contact her provider with symptoms.   08/02/2020-Review the above symptoms and what to do by contacting her provider. Will verify pt remains in the GREEN zone with no acute symptoms.       Raina Mina, RN Care Management Coordinator [place] Office [phone]

## 2020-10-18 DIAGNOSIS — E039 Hypothyroidism, unspecified: Secondary | ICD-10-CM | POA: Diagnosis not present

## 2020-10-18 DIAGNOSIS — I5032 Chronic diastolic (congestive) heart failure: Secondary | ICD-10-CM | POA: Diagnosis not present

## 2020-10-18 DIAGNOSIS — I10 Essential (primary) hypertension: Secondary | ICD-10-CM | POA: Diagnosis not present

## 2020-10-18 DIAGNOSIS — H4089 Other specified glaucoma: Secondary | ICD-10-CM | POA: Diagnosis not present

## 2020-10-18 DIAGNOSIS — M810 Age-related osteoporosis without current pathological fracture: Secondary | ICD-10-CM | POA: Diagnosis not present

## 2020-10-18 DIAGNOSIS — E890 Postprocedural hypothyroidism: Secondary | ICD-10-CM | POA: Diagnosis not present

## 2020-10-18 DIAGNOSIS — K219 Gastro-esophageal reflux disease without esophagitis: Secondary | ICD-10-CM | POA: Diagnosis not present

## 2020-10-18 DIAGNOSIS — E785 Hyperlipidemia, unspecified: Secondary | ICD-10-CM | POA: Diagnosis not present

## 2020-10-20 ENCOUNTER — Ambulatory Visit (INDEPENDENT_AMBULATORY_CARE_PROVIDER_SITE_OTHER): Payer: Medicare Other | Admitting: Orthopedic Surgery

## 2020-10-20 ENCOUNTER — Other Ambulatory Visit: Payer: Self-pay

## 2020-10-20 ENCOUNTER — Encounter: Payer: Self-pay | Admitting: Orthopedic Surgery

## 2020-10-20 VITALS — BP 106/62 | HR 53 | Temp 97.9°F | Resp 20 | Ht 60.0 in | Wt 130.8 lb

## 2020-10-20 DIAGNOSIS — E890 Postprocedural hypothyroidism: Secondary | ICD-10-CM | POA: Diagnosis not present

## 2020-10-20 DIAGNOSIS — N8110 Cystocele, unspecified: Secondary | ICD-10-CM | POA: Diagnosis not present

## 2020-10-20 DIAGNOSIS — H3530 Unspecified macular degeneration: Secondary | ICD-10-CM | POA: Diagnosis not present

## 2020-10-20 DIAGNOSIS — K219 Gastro-esophageal reflux disease without esophagitis: Secondary | ICD-10-CM | POA: Diagnosis not present

## 2020-10-20 DIAGNOSIS — R053 Chronic cough: Secondary | ICD-10-CM | POA: Diagnosis not present

## 2020-10-20 DIAGNOSIS — I480 Paroxysmal atrial fibrillation: Secondary | ICD-10-CM | POA: Diagnosis not present

## 2020-10-20 DIAGNOSIS — R413 Other amnesia: Secondary | ICD-10-CM

## 2020-10-20 DIAGNOSIS — M8580 Other specified disorders of bone density and structure, unspecified site: Secondary | ICD-10-CM | POA: Diagnosis not present

## 2020-10-20 DIAGNOSIS — Z7901 Long term (current) use of anticoagulants: Secondary | ICD-10-CM | POA: Diagnosis not present

## 2020-10-20 DIAGNOSIS — H409 Unspecified glaucoma: Secondary | ICD-10-CM

## 2020-10-20 DIAGNOSIS — I10 Essential (primary) hypertension: Secondary | ICD-10-CM

## 2020-10-20 DIAGNOSIS — R2681 Unsteadiness on feet: Secondary | ICD-10-CM

## 2020-10-20 DIAGNOSIS — I340 Nonrheumatic mitral (valve) insufficiency: Secondary | ICD-10-CM | POA: Diagnosis not present

## 2020-10-20 DIAGNOSIS — G4709 Other insomnia: Secondary | ICD-10-CM | POA: Diagnosis not present

## 2020-10-20 NOTE — Patient Instructions (Signed)
Preventive Care 85 Years and Older, Female Preventive care refers to lifestyle choices and visits with your health care provider that can promote health and wellness. This includes:  A yearly physical exam. This is also called an annual wellness visit.  Regular dental and eye exams.  Immunizations.  Screening for certain conditions.  Healthy lifestyle choices, such as: ? Eating a healthy diet. ? Getting regular exercise. ? Not using drugs or products that contain nicotine and tobacco. ? Limiting alcohol use. What can I expect for my preventive care visit? Physical exam Your health care provider will check your:  Height and weight. These may be used to calculate your BMI (body mass index). BMI is a measurement that tells if you are at a healthy weight.  Heart rate and blood pressure.  Body temperature.  Skin for abnormal spots. Counseling Your health care provider may ask you questions about your:  Past medical problems.  Family's medical history.  Alcohol, tobacco, and drug use.  Emotional well-being.  Home life and relationship well-being.  Sexual activity.  Diet, exercise, and sleep habits.  History of falls.  Memory and ability to understand (cognition).  Work and work Statistician.  Pregnancy and menstrual history.  Access to firearms. What immunizations do I need? Vaccines are usually given at various ages, according to a schedule. Your health care provider will recommend vaccines for you based on your age, medical history, and lifestyle or other factors, such as travel or where you work.   What tests do I need? Blood tests  Lipid and cholesterol levels. These may be checked every 5 years, or more often depending on your overall health.  Hepatitis C test.  Hepatitis B test. Screening  Lung cancer screening. You may have this screening every year starting at age 74 if you have a 30-pack-year history of smoking and currently smoke or have quit within  the past 15 years.  Colorectal cancer screening. ? All adults should have this screening starting at age 44 and continuing until age 58. ? Your health care provider may recommend screening at age 2 if you are at increased risk. ? You will have tests every 1-10 years, depending on your results and the type of screening test.  Diabetes screening. ? This is done by checking your blood sugar (glucose) after you have not eaten for a while (fasting). ? You may have this done every 1-3 years.  Mammogram. ? This may be done every 1-2 years. ? Talk with your health care provider about how often you should have regular mammograms.  Abdominal aortic aneurysm (AAA) screening. You may need this if you are a current or former smoker.  BRCA-related cancer screening. This may be done if you have a family history of breast, ovarian, tubal, or peritoneal cancers. Other tests  STD (sexually transmitted disease) testing, if you are at risk.  Bone density scan. This is done to screen for osteoporosis. You may have this done starting at age 104. Talk with your health care provider about your test results, treatment options, and if necessary, the need for more tests. Follow these instructions at home: Eating and drinking  Eat a diet that includes fresh fruits and vegetables, whole grains, lean protein, and low-fat dairy products. Limit your intake of foods with high amounts of sugar, saturated fats, and salt.  Take vitamin and mineral supplements as recommended by your health care provider.  Do not drink alcohol if your health care provider tells you not to drink.  If you drink alcohol: ? Limit how much you have to 0-1 drink a day. ? Be aware of how much alcohol is in your drink. In the [place] one drink equals one 12 oz bottle of beer (355 mL), one 5 oz glass of wine (148 mL), or one 1 oz glass of hard liquor (44 mL).   Lifestyle  Take daily care of your teeth and gums. Brush your teeth every morning  and night with fluoride toothpaste. Floss one time each day.  Stay active. Exercise for at least 30 minutes 5 or more days each week.  Do not use any products that contain nicotine or tobacco, such as cigarettes, e-cigarettes, and chewing tobacco. If you need help quitting, ask your health care provider.  Do not use drugs.  If you are sexually active, practice safe sex. Use a condom or other form of protection in order to prevent STIs (sexually transmitted infections).  Talk with your health care provider about taking a low-dose aspirin or statin.  Find healthy ways to cope with stress, such as: ? Meditation, yoga, or listening to music. ? Journaling. ? Talking to a trusted person. ? Spending time with friends and family. Safety  Always wear your seat belt while driving or riding in a vehicle.  Do not drive: ? If you have been drinking alcohol. Do not ride with someone who has been drinking. ? When you are tired or distracted. ? While texting.  Wear a helmet and other protective equipment during sports activities.  If you have firearms in your house, make sure you follow all gun safety procedures. What's next?  Visit your health care provider once a year for an annual wellness visit.  Ask your health care provider how often you should have your eyes and teeth checked.  Stay up to date on all vaccines. This information is not intended to replace advice given to you by your health care provider. Make sure you discuss any questions you have with your health care provider. Document Revised: 07/26/2020 Document Reviewed: 07/30/2018 Elsevier Patient Education  2021 [employer]  

## 2020-10-20 NOTE — Progress Notes (Signed)
Careteam: Patient Care Team: Ephriam Jenkins, PA as PCP - General (Physician Assistant) [name], MD as PCP - Cardiology (Cardiology) [name], RN as [place] Management  Seen by: [name], AGNP-C  PLACE OF SERVICE:  [place] Directive information Does Patient Have a Medical Advance Directive?: Yes (Daughter has copy), Does patient want to make changes to medical advance directive?: No - Patient declined  Allergies  Allergen Reactions  . Azithromycin Nausea Only and Other (See Comments)  . Compazine [Prochlorperazine Edisylate] Nausea Only  . Demerol Nausea Only  . Dolophine [Methadone] Nausea Only  . Ebastine Nausea Only    EBS  . Erythromycin Nausea Only and Other (See Comments)  . Ibuprofen Nausea Only and Other (See Comments)  . Meperidine Hcl Other (See Comments)  . Morphine Sulfate Other (See Comments)  . Statins Other (See Comments)    myalgias  . Tetanus Toxoids Nausea Only  . Tetracycline Other (See Comments)  . Tetracyclines & Related Nausea Only    Chief Complaint  Patient presents with  . Establish Care    Establish Care     HPI: Patient is a 87 y.o. female seen today to establish at [facility]. Medical records requested from previous PCP.   Followed by E[facility] Peers and [facility] in the past. Since covid started, she reports PCP kept sending her to the ED for laryngitis instead of seeing her. She became frustrated with not being seen and decided to switch providers. [place] recommended by Dr. Oval Linsey, her cardiologist. Lives in a 1 story town home. Does her own cleaning and ADLs. Assistance with shopping and meal preparation. She has a life alert. Still drives. Widowed. 2 daughters and son.Taught kindergarten for 10 years. Husband passed at age 36, abpout 13 years ago.   Specialists:  Dermatologist- Dr. Rolm Bookbinder Gynecologist- Dr. Arvella Nigh Cardiologist- Dr. [name] Ophthalmologist- Dr. Marshall Cork Neurologist- Dr. Dory Larsen   Past MI - began in her 79's. Does not know how many she has had. Cardiologist- Dr. [name]. Takes imdur, nitroglycerin. Sometimes has chest pain, but resolves on its own.   High blood pressure- diagnosed in her 50's. Follows low sodium diet. Takes bp at home. Records her blood pressure daily.   Atrial fibrillation- does not remember when she was diagnosed. Takes eliquis daily. Denies sob or palpitations.   Mitral valve prolapse- diagnosed years ago, cannot remember.   Thyroid cancer- she was positive for cancer at age 55. Thyroidectomy done by Dr. Harlow Asa. Takes levothyroxine daily, takes on empty stomach. Denies fatigue, constipation, dry skin.   Heartburn- has had for years. Use to take ranitidine, but recalled. Takes ompeprazole once a day. Does not know food triggers.   Cough- occasionally takes tessalon pearls for cough. Believes it is associated with acid reflux.   Insomnia- takes temazepam at night. Sleeps about 6-8 hours. Like to sleep in mornings.   Urinary issues-past partial hysterectomy. Needed Pessary a few years after operation. Pessary cannot stay in. Followed by gynecology. Claims she has a lot of discomfort, prefers to sit. Urinates about 1-2 times nightly.  Mammogram 2018. Thinks she had mammogram last year. Denies changes in breast.   Bone density- does not remeber when she last had one.  Takes calcium and vitamin D daily. Reports fall this past Saturday, no injury. Uses cane to ambulate. Denies any joint pain or injuries.   Eyes examined- followed by Dr. [name]. Scheduled  next week. Uses glasses. History of cataracts, macular degeneration, glaucoma, and calcium growth on right eye.   Dental- last dental exam last month. Has her own teeth. Followed by Dr. Orene Desanctis.   Dermatologist- history of skin cancer. Followed by Dr. Rolm Bookbinder. Plans to schedule appointment.   She has a  living will. Daughters have POA. She believes [facility] has a copy.      Review of Systems:  Review of Systems  Constitutional: Positive for chills. Negative for fever and malaise/fatigue.  HENT: Negative for hearing loss and sore throat.   Eyes: Negative for blurred vision and double vision.       Glasses  Respiratory: Positive for cough. Negative for shortness of breath and wheezing.   Cardiovascular: Negative for chest pain and leg swelling.       History of heart attack  Gastrointestinal: Positive for diarrhea and heartburn.  Genitourinary: Positive for frequency. Negative for dysuria.       Incontience  Musculoskeletal: Positive for falls, joint pain and myalgias.  Skin:       History of skin cancer  Neurological: Positive for weakness. Negative for dizziness and headaches.  Endo/Heme/Allergies: Bruises/bleeds easily.  Psychiatric/Behavioral: Positive for memory loss. Negative for depression. The patient is not nervous/anxious and does not have insomnia.     Past Medical History:  Diagnosis Date  . Bladder prolapse, female, acquired   . Cancer ([place])    thyroid  . Chest pain    no known ischemic heart disease; negative Myoview July 2013. EF 75% with no ischemia.   . Chronic anticoagulation   . Chronic atrial fibrillation (HCC)    managed with rate control and coumadin  . Chronic diastolic heart failure ([place])   . GERD (gastroesophageal reflux disease)   . Heart disease   . History of congenital mitral regurgitation    mild  . History of heart attack    Multiple   . History of mitral valve prolapse 06/15/2008   a. echo 1/14: mild LVH, EF 60-65%, mild MR, mild to mod BAE, PASP 35  . Hypercholesterolemia   . Hypertension   . Hypothyroidism    Past Surgical History:  Procedure Laterality Date  . ABDOMINAL HYSTERECTOMY    . CATARACT EXTRACTION    . CHOLECYSTECTOMY    . Thyroidectomy    . TONSILLECTOMY     Social History:   reports that she has never smoked. She has  never used smokeless tobacco. She reports that she does not drink alcohol and does not use drugs.  Family History  Problem Relation Age of Onset  . Stroke Mother 15  . Stroke Father 2  . Heart attack Neg Hx   . Heart disease Neg Hx     Medications: Patient's Medications  New Prescriptions   No medications on file  Previous Medications   AMLODIPINE (NORVASC) 2.5 MG TABLET    Take 5 mg by mouth daily.   APIXABAN (ELIQUIS) 2.5 MG TABS TABLET    Take 1 tablet (2.5 mg total) by mouth 2 (two) times daily.   ATENOLOL (TENORMIN) 50 MG TABLET    Take 50 mg by mouth 2 (two) times daily.   BENZONATATE (TESSALON) 100 MG CAPSULE    Take 1 capsule (100 mg total) by mouth every 8 (eight) hours as needed for cough.   CALCIUM CARBONATE-VITAMIN D (CALCIUM 600+D PO)    Take 1 tablet by mouth 2 (two) times daily.   CHOLECALCIFEROL (VITAMIN D) 2000 UNITS TABLET  Take 4,000 Units by mouth daily.   DORZOLAMIDE (TRUSOPT) 2 % OPHTHALMIC SOLUTION    Place 1 drop into the left eye 2 (two) times daily.   FUROSEMIDE (LASIX) 40 MG TABLET    Take 1 tablet (40 mg total) by mouth 2 (two) times daily. May take an additional 40 mg as needed for swelling or weight gain   IRBESARTAN (AVAPRO) 300 MG TABLET    Take 150 mg by mouth in the morning and at bedtime. 1/2 tablet daily   ISOSORBIDE MONONITRATE (IMDUR) 30 MG 24 HR TABLET    TAKE 1 TABLET BY MOUTH  DAILY   LATANOPROST (XALATAN) 0.005 % OPHTHALMIC SOLUTION    Place 1 drop into both eyes at bedtime.    LEVOTHYROXINE (SYNTHROID, LEVOTHROID) 100 MCG TABLET    Take 100 mcg by mouth daily.    LOPERAMIDE HCL (IMODIUM PO)    Take 1 capsule by mouth 2 (two) times daily.   MAGNESIUM OXIDE 400 PO    Take 800 mg by mouth daily.   MULTIPLE VITAMINS-MINERALS (CENTRUM SILVER PO)    Take 1 tablet by mouth daily.   MULTIPLE VITAMINS-MINERALS (PRESERVISION AREDS 2 PO)    Take 1 capsule by mouth 2 (two) times daily.    NITROGLYCERIN (NITROSTAT) 0.4 MG SL TABLET    DISSOLVE 1 TABLET  UNDER THE TONGUE EVERY 5 MINUTES FOR 3 DOSES AS NEEDED  FOR  CHEST  PAIN   OMEPRAZOLE (PRILOSEC) 40 MG CAPSULE    Take 40 mg by mouth daily.   POTASSIUM CHLORIDE (KLOR-CON) 10 MEQ TABLET    Take 10 mEq by mouth daily.   PRAMIPEXOLE (MIRAPEX) 0.125 MG TABLET    Take 1 tablet by mouth at bedtime.   PREMARIN VAGINAL CREAM    Place 2 g vaginally once a week. Uses on Sunday and wednesday   PROBIOTIC PRODUCT (PROBIOTIC DAILY PO)    Take 1 tablet by mouth daily.   SODIUM CHLORIDE (MURO 128) 5 % OPHTHALMIC SOLUTION    Place 1 drop into the right eye at bedtime.    TEMAZEPAM (RESTORIL) 15 MG CAPSULE    Take 15 mg by mouth at bedtime.  Modified Medications   No medications on file  Discontinued Medications   AMLODIPINE (NORVASC) 2.5 MG TABLET    Take 2 tablets (5 mg total) by mouth daily.   PANTOPRAZOLE (PROTONIX) 20 MG TABLET    Take 1 tablet (20 mg total) by mouth daily.   POTASSIUM CHLORIDE (KLOR-CON) 10 MEQ TABLET    Take 1 tablet (10 mEq total) by mouth daily.   TEMAZEPAM (RESTORIL) 15 MG CAPSULE    Take 1 capsule (15 mg total) by mouth at bedtime.    Physical Exam:  Vitals:   10/20/20 1113  BP: 106/62  Resp: 20  Temp: 97.9 F (36.6 C)  TempSrc: Temporal  Weight: 130 lb 12.8 oz (59.3 kg)  Height: 5' (1.524 m)   Body mass index is 25.55 kg/m. Wt Readings from Last 3 Encounters:  10/20/20 130 lb 12.8 oz (59.3 kg)  10/04/20 133 lb 6.4 oz (60.5 kg)  06/22/20 131 lb 6.4 oz (59.6 kg)    Physical Exam Vitals reviewed.  Constitutional:      General: She is not in acute distress. HENT:     Head: Normocephalic.  Eyes:     General:        Right eye: No discharge.        Left eye: No discharge.  Cardiovascular:  Rate and Rhythm: Normal rate. Rhythm irregular.     Pulses: Normal pulses.     Heart sounds: Normal heart sounds. No murmur heard.   Pulmonary:     Effort: Respiratory distress present.     Breath sounds: Normal breath sounds. No wheezing.  Abdominal:     General:  Abdomen is flat. Bowel sounds are normal. There is no distension.     Palpations: Abdomen is soft.     Tenderness: There is no abdominal tenderness.  Musculoskeletal:     Right lower leg: No edema.     Left lower leg: No edema.  Skin:    General: Skin is warm and dry.     Capillary Refill: Capillary refill takes less than 2 seconds.  Neurological:     General: No focal deficit present.     Mental Status: She is alert and oriented to person, place, and time.     Motor: Weakness present.     Gait: Gait abnormal.     Comments: cane  Psychiatric:        Mood and Affect: Mood normal.        Behavior: Behavior normal.     Labs reviewed: Basic Metabolic Panel: Recent Labs    12/09/19 1907 12/10/19 1754 12/11/19 0403 12/12/19 0439 12/14/19 0355 12/15/19 0310 01/27/20 1211  NA 134*   < > 138   < > 134* 134* 140  K 4.1   < > 3.7   < > 3.7 3.4* 4.1  CL 95*   < > 100   < > 95* 94* 98  CO2 29   < > 27   < > 27 30 28   GLUCOSE 107*   < > 101*   < > 107* 100* 103*  BUN 11   < > 13   < > 17 18 11   CREATININE 0.69   < > 0.67   < > 0.72 0.80 0.82  CALCIUM 8.0*   < > 7.5*   < > 7.9* 8.1* 8.8  MG 2.4  --  2.0  --   --   --   --    < > = values in this interval not displayed.   Liver Function Tests: Recent Labs    12/09/19 1907  AST 34  ALT 28  ALKPHOS 134*  BILITOT 1.8*  PROT 7.5  ALBUMIN 4.1   No results for input(s): LIPASE, AMYLASE in the last 8760 hours. No results for input(s): AMMONIA in the last 8760 hours. CBC: Recent Labs    12/09/19 1907 12/10/19 1754  WBC 13.0* 9.6  NEUTROABS 9.9* 6.5  HGB 13.9 12.2  HCT 42.2 36.2  MCV 94.6 94.8  PLT 209 186   Lipid Panel: No results for input(s): CHOL, HDL, LDLCALC, TRIG, CHOLHDL, LDLDIRECT in the last 8760 hours. TSH: No results for input(s): TSH in the last 8760 hours. A1C: No results found for: HGBA1C   Assessment/Plan 1. Paroxysmal atrial fibrillation (HCC) - rate controlled with atenolol - cont eliquis for  clot prevention  2. Essential hypertension - bp at goal < 140/80 - cont amlodipine, irbesartan and atenolol - cont low sodium diet < 2000 mg /day - cbc/diff- future -bmp- future  3. Mitral valve insufficiency, unspecified etiology - followed by cardiology - asymptomatic  4. Postoperative hypothyroidism - stable with levothyroxine  5. Chronic anticoagulation - denies hematuia and blood in stool - cont to monitor  6. Gastroesophageal reflux disease without esophagitis - stable with omeprazole  7.  Chronic cough - possibly caused by acid reflux - cont tessalon pearls prn  8. Other insomnia - stable with temazepam  9. Osteopenia, unspecified location - unknown when last done density done - will request study from previous PCP - cont calcium and vitamin d supplement - fall last week- consider PT in future if occurring more often  10. Female bladder prolapse - followed by gynecology, past use of pessary - f/u scheduled soon  11. Macular degeneration of both eyes, unspecified type - no changes in vision - followed by opthalmologist  12. Gait instability - steady with cane - fall last week- was not using cane  13. Glaucoma of both eyes, unspecified glaucoma type - cont dorzolamide and latanoprost drops  14. Memory changes - MMSE- future  I provided 65 minutes of face-to-face time during this encounter.      Next appt: 11/24/2020 [name], [facility] [phone]

## 2020-10-26 DIAGNOSIS — H401131 Primary open-angle glaucoma, bilateral, mild stage: Secondary | ICD-10-CM | POA: Diagnosis not present

## 2020-10-26 DIAGNOSIS — H18423 Band keratopathy, bilateral: Secondary | ICD-10-CM | POA: Diagnosis not present

## 2020-11-03 ENCOUNTER — Other Ambulatory Visit: Payer: Self-pay | Admitting: Cardiovascular Disease

## 2020-11-03 ENCOUNTER — Other Ambulatory Visit: Payer: Self-pay | Admitting: Neurology

## 2020-11-10 ENCOUNTER — Other Ambulatory Visit: Payer: Self-pay | Admitting: *Deleted

## 2020-11-10 NOTE — Patient Outreach (Signed)
[facility]) Care Management  11/10/2020  Kristen Whitehead [birth year] [id number]   Telephone Assessment-Successful-HF  RN spoke with pt and received an update on her ongoing management of care. Pt reports issues with unable to sleep last night and took her Restoril 15 mg as scheduled however was not able to sleep and took another dose at 7:00 AM with a few hours of sleep. RN strongly encourage not to duplicate medication or take extra dose until advised by her provider. Pt took her vitals and aware when to call her provider. Other issues pt reported some swelling but she is unable to wear her TEDS but will attempt to purchase a lighter constrictions that are easier to apply to her LE. Strongly encourage pt to elevate throughout the days to reduce ongoing swelling. Resources have bee provided to pt that she has utilized in the past Music therapist, [place], [place]).  Review and discussed plan of care with noted updates. Verified upcoming appointment with primary provider next week. No other issues to address at this time. Will follow up in a few weeks concerning pt's ongoing management of care and continue to encouraged adherence with the plan of care.   Goals Addressed            This Visit's Progress   . THN-Learn More About My Health   On track    Follow Up Date 12/07/2020 Timeframe:  Long-Range Goal Priority:  Medium Start Date:     09/12/2020                        Expected End Date:  12/15/2020                   - tell my story and reason for my visit - make a list of questions - ask questions - repeat what I heard to make sure I understand - speak up when I don't understand    Why is this important?    The best way to learn about your health and care is by talking to the doctor and nurse.   They will answer your questions and give you information in the way that you like best.    Notes:   1/25-Will discuss pt's ongoing medical issues and how her medical  conditions can affect her health. Always encourage pt on when to seek medical attention with her ongoing medical issues and encountered symptoms.    . THN-Track and Manage Fluids and Swelling   On track    Follow Up Date 12/07/2020 Timeframe:  Short-Term Goal Priority:  Medium Start Date:     08/02/2020                        Expected End Date:   12/15/2020                   - call office if I gain more than 2 pounds in one day or 5 pounds in one week - keep legs up while sitting - track weight in diary - use salt in moderation - watch for swelling in feet, ankles and legs every day - weigh myself daily    Why is this important?   It is important to check your weight daily and watch how much salt and liquids you have.  It will help you to manage your heart failure.    Notes:  3/25-Pt reports some swelling over the  last month with increase swelling to her LE 2.5 lbs overnight. Pt reports she will try to obtain a light version at a medical supply story. Also recommended a nursing supply store that maybe less expensive or [facility] therapy in [place] another resources. Pt aware to elevate throughout the day to prevent further swelling.  2/25-Reports some fluid retention where her provided added additional medications for a few days until back at baseline. No additional symptoms as pt remains on track at this time. Will continue to encouraged ongoing management of care. 09/12/2020- Pt reports weights as followed: Today 128.8 lbs, Yesterday 129.6 lbs and last week at 131.4 lbs. Pt remains in the GREEN zone and remains symptoms free with no acute problems. Discussed baseline weights and ongoing monitoring system for prevention.  08/02/2020-Today weight 129.4 lbs, Yesterday 127.8 lbs and last week 127.2 lbs and pt remains asymptomatic with no swelling.     . THN-Track and Manage Symptoms   On track    Follow Up Date  12/07/2020  Timeframe:  Long-Range Goal Priority:  Medium Start Date:     08/02/2020                         Expected End Date:   12/15/2020                  - begin a heart failure diary - bring diary to all appointments - follow rescue plan if symptoms flare-up - know when to call the doctor - track symptoms and what helps feel better or worse    Why is this important?   You will be able to handle your symptoms better if you keep track of them.  Making some simple changes to your lifestyle will help.  Eating healthy is one thing you can do to take good care of yourself.    Notes:  2/25-Pt verifies she remains in the GREEN zone at this time however episode with 3 lbs where her MD intervened with added medications until she returned to her baseline weight. This AM 127 lbs and yesterday 128.4 lbs with no additional symptoms.   09/12/2020-Verified pt remains in the GREEN zone with no acute symptoms. Will continue to offer [facility] calendar for ongoing documentation of all weights and/or symptoms.  Discuss the sones and what to do if acute symptoms occur.Will also alert pt on when to contact her provider with symptoms.   08/02/2020-Review the above symptoms and what to do by contacting her provider. Will verify pt remains in the GREEN zone with no acute symptoms.       Raina Mina, RN Care Management Coordinator [place] Office [phone]

## 2020-11-23 ENCOUNTER — Encounter: Payer: Self-pay | Admitting: Orthopedic Surgery

## 2020-11-23 ENCOUNTER — Other Ambulatory Visit: Payer: Self-pay

## 2020-11-23 ENCOUNTER — Ambulatory Visit (INDEPENDENT_AMBULATORY_CARE_PROVIDER_SITE_OTHER): Payer: Medicare Other | Admitting: Orthopedic Surgery

## 2020-11-23 VITALS — BP 100/70 | HR 79 | Temp 97.5°F | Resp 20 | Ht 60.0 in | Wt 129.0 lb

## 2020-11-23 DIAGNOSIS — E890 Postprocedural hypothyroidism: Secondary | ICD-10-CM | POA: Diagnosis not present

## 2020-11-23 DIAGNOSIS — I482 Chronic atrial fibrillation, unspecified: Secondary | ICD-10-CM

## 2020-11-23 DIAGNOSIS — G4709 Other insomnia: Secondary | ICD-10-CM

## 2020-11-23 DIAGNOSIS — I10 Essential (primary) hypertension: Secondary | ICD-10-CM

## 2020-11-23 DIAGNOSIS — N8110 Cystocele, unspecified: Secondary | ICD-10-CM

## 2020-11-23 DIAGNOSIS — I480 Paroxysmal atrial fibrillation: Secondary | ICD-10-CM

## 2020-11-23 DIAGNOSIS — H3530 Unspecified macular degeneration: Secondary | ICD-10-CM

## 2020-11-23 DIAGNOSIS — I340 Nonrheumatic mitral (valve) insufficiency: Secondary | ICD-10-CM

## 2020-11-23 DIAGNOSIS — R413 Other amnesia: Secondary | ICD-10-CM

## 2020-11-23 DIAGNOSIS — K219 Gastro-esophageal reflux disease without esophagitis: Secondary | ICD-10-CM

## 2020-11-23 NOTE — Patient Instructions (Signed)

## 2020-11-23 NOTE — Progress Notes (Signed)
Careteam: Patient Care Team: Kristen Alanis, NP as PCP - General (Adult Health Nurse Practitioner) Kristen Latch, MD as PCP - Cardiology (Cardiology) [name], RN as [place] Management  Seen by: Kristen Whitehead, AGNP-C  PLACE OF SERVICE:  [facility] Directive information    Allergies  Allergen Reactions  . Azithromycin Nausea Only and Other (See Comments)  . Compazine [Prochlorperazine Edisylate] Nausea Only  . Demerol Nausea Only  . Dolophine [Methadone] Nausea Only  . Ebastine Nausea Only    EBS  . Erythromycin Nausea Only and Other (See Comments)  . Ibuprofen Nausea Only and Other (See Comments)  . Meperidine Hcl Other (See Comments)  . Morphine Sulfate Other (See Comments)  . Statins Other (See Comments)    myalgias  . Tetanus Toxoids Nausea Only  . Tetracycline Other (See Comments)  . Tetracyclines & Related Nausea Only    No chief complaint on file.    HPI: Patient is a 87 y.o. female seen today for medical management of chronic conditions.   Just celebrated her 53 th birthday with friends and family. She did something different everyday.   MMSE today score 29/30. Clock and shapes correct.   Recently had eye exam by Dr. Kathlen Whitehead. She is unsure when her next follow up is. No changes in vision. Still using magnifying glass in morning to read. Reports calcium growth on right eye. Taking daily eye drops for macular degeneration and glaucoma.   Insomnia- reports taking 2 doses of temazepam on 03/25. She had trouble sleeping the week of her birthday, but has since resolved. States " I have been a night owl most my life." On nights she cannot sleep, she watches television. Reports about 7 hours/night. No issues with her sleep last night.   Continues to weight herself daily. Reports 2 lb weight gain, 2 days ago. Will take extra lasix if her ankles swell. Averaging 1-2 extra doses a week. Continues to wear compression stockings.    Uses cane when she ambulates. No new falls from last visit.   Review of Systems:  Review of Systems  Constitutional: Negative for fever and malaise/fatigue.  HENT: Negative for hearing loss and sore throat.   Eyes: Negative for blurred vision.  Respiratory: Positive for shortness of breath. Negative for cough and wheezing.   Cardiovascular: Positive for leg swelling. Negative for chest pain.  Gastrointestinal: Negative for abdominal pain, constipation, diarrhea, heartburn, nausea and vomiting.  Genitourinary: Negative for dysuria and hematuria.  Musculoskeletal: Positive for joint pain and myalgias.  Skin: Negative.   Neurological: Positive for weakness. Negative for dizziness and headaches.  Psychiatric/Behavioral: Negative for depression. The patient has insomnia. The patient is not nervous/anxious.     Past Medical History:  Diagnosis Date  . Bladder prolapse, female, acquired   . Cancer ([place])    thyroid  . Chest pain    no known ischemic heart disease; negative Myoview July 2013. EF 75% with no ischemia.   . Chronic anticoagulation   . Chronic atrial fibrillation (HCC)    managed with rate control and coumadin  . Chronic diastolic heart failure ([place])   . GERD (gastroesophageal reflux disease)   . Heart disease   . History of congenital mitral regurgitation    mild  . History of heart attack    Multiple   . History of mitral valve prolapse 06/15/2008   a. echo 1/14: mild LVH, EF 60-65%, mild MR, mild to mod BAE, PASP  35  . Hypercholesterolemia   . Hypertension   . Hypothyroidism    Past Surgical History:  Procedure Laterality Date  . ABDOMINAL HYSTERECTOMY    . CATARACT EXTRACTION    . CHOLECYSTECTOMY    . Thyroidectomy    . TONSILLECTOMY     Social History:   reports that she has never smoked. She has never used smokeless tobacco. She reports that she does not drink alcohol and does not use drugs.  Family History  Problem Relation Age of Onset  . Stroke  Mother 90  . Stroke Father 57  . Heart attack Neg Hx   . Heart disease Neg Hx     Medications: Patient's Medications  New Prescriptions   No medications on file  Previous Medications   AMLODIPINE (NORVASC) 2.5 MG TABLET    Take 5 mg by mouth daily.   APIXABAN (ELIQUIS) 2.5 MG TABS TABLET    Take 1 tablet (2.5 mg total) by mouth 2 (two) times daily.   ATENOLOL (TENORMIN) 50 MG TABLET    TAKE 1 TABLET BY MOUTH  TWICE DAILY   BENZONATATE (TESSALON) 100 MG CAPSULE    Take 1 capsule (100 mg total) by mouth every 8 (eight) hours as needed for cough.   CALCIUM CARBONATE-VITAMIN D (CALCIUM 600+D PO)    Take 1 tablet by mouth 2 (two) times daily.   CHOLECALCIFEROL (VITAMIN D) 2000 UNITS TABLET    Take 4,000 Units by mouth daily.   DORZOLAMIDE (TRUSOPT) 2 % OPHTHALMIC SOLUTION    Place 1 drop into the left eye 2 (two) times daily.   FUROSEMIDE (LASIX) 40 MG TABLET    Take 1 tablet (40 mg total) by mouth 2 (two) times daily. May take an additional 40 mg as needed for swelling or weight gain   IRBESARTAN (AVAPRO) 300 MG TABLET    Take 150 mg by mouth in the morning and at bedtime. 1/2 tablet daily   ISOSORBIDE MONONITRATE (IMDUR) 30 MG 24 HR TABLET    TAKE 1 TABLET BY MOUTH  DAILY   LATANOPROST (XALATAN) 0.005 % OPHTHALMIC SOLUTION    Place 1 drop into both eyes at bedtime.    LEVOTHYROXINE (SYNTHROID, LEVOTHROID) 100 MCG TABLET    Take 100 mcg by mouth daily.    LOPERAMIDE HCL (IMODIUM PO)    Take 1 capsule by mouth 2 (two) times daily.   MAGNESIUM OXIDE 400 PO    Take 800 mg by mouth daily.   MULTIPLE VITAMINS-MINERALS (CENTRUM SILVER PO)    Take 1 tablet by mouth daily.   MULTIPLE VITAMINS-MINERALS (PRESERVISION AREDS 2 PO)    Take 1 capsule by mouth 2 (two) times daily.    NITROGLYCERIN (NITROSTAT) 0.4 MG SL TABLET    DISSOLVE 1 TABLET UNDER THE TONGUE EVERY 5 MINUTES FOR 3 DOSES AS NEEDED  FOR  CHEST  PAIN   OMEPRAZOLE (PRILOSEC) 40 MG CAPSULE    Take 40 mg by mouth daily.   POTASSIUM CHLORIDE  (KLOR-CON) 10 MEQ TABLET    Take 10 mEq by mouth daily.   PRAMIPEXOLE (MIRAPEX) 0.125 MG TABLET    TAKE 1 TABLET BY MOUTH IN  THE EVENING   PREMARIN VAGINAL CREAM    Place 2 g vaginally once a week. Uses on Sunday and wednesday   PROBIOTIC PRODUCT (PROBIOTIC DAILY PO)    Take 1 tablet by mouth daily.   SODIUM CHLORIDE (MURO 128) 5 % OPHTHALMIC SOLUTION    Place 1 drop into the right  eye at bedtime.    TEMAZEPAM (RESTORIL) 15 MG CAPSULE    Take 15 mg by mouth at bedtime.  Modified Medications   No medications on file  Discontinued Medications   No medications on file    Physical Exam:  There were no vitals filed for this visit. There is no height or weight on file to calculate BMI. Wt Readings from Last 3 Encounters:  10/20/20 130 lb 12.8 oz (59.3 kg)  10/04/20 133 lb 6.4 oz (60.5 kg)  06/22/20 131 lb 6.4 oz (59.6 kg)    Physical Exam Vitals reviewed.  Constitutional:      General: She is not in acute distress. HENT:     Head: Normocephalic.  Eyes:   Cardiovascular:     Rate and Rhythm: Normal rate. Rhythm irregular.     Pulses: Normal pulses.     Heart sounds: Normal heart sounds. No murmur heard.   Pulmonary:     Effort: Pulmonary effort is normal. No respiratory distress.     Breath sounds: Normal breath sounds. No wheezing.  Abdominal:     General: Bowel sounds are normal. There is no distension.     Tenderness: There is no abdominal tenderness.  Musculoskeletal:     Cervical back: Normal range of motion.     Right lower leg: No edema.     Left lower leg: No edema.     Comments: Compression stockings present  Lymphadenopathy:     Cervical: No cervical adenopathy.  Skin:    General: Skin is warm and dry.     Capillary Refill: Capillary refill takes less than 2 seconds.  Neurological:     General: No focal deficit present.     Mental Status: She is alert and oriented to person, place, and time.     Motor: Weakness present.     Gait: Gait abnormal.      Comments: cane  Psychiatric:        Mood and Affect: Mood normal.        Behavior: Behavior normal.     Labs reviewed: Basic Metabolic Panel: Recent Labs    12/09/19 1907 12/10/19 1754 12/11/19 0403 12/12/19 0439 12/14/19 0355 12/15/19 0310 01/27/20 1211  NA 134*   < > 138   < > 134* 134* 140  K 4.1   < > 3.7   < > 3.7 3.4* 4.1  CL 95*   < > 100   < > 95* 94* 98  CO2 29   < > 27   < > 27 30 28   GLUCOSE 107*   < > 101*   < > 107* 100* 103*  BUN 11   < > 13   < > 17 18 11   CREATININE 0.69   < > 0.67   < > 0.72 0.80 0.82  CALCIUM 8.0*   < > 7.5*   < > 7.9* 8.1* 8.8  MG 2.4  --  2.0  --   --   --   --    < > = values in this interval not displayed.   Liver Function Tests: Recent Labs    12/09/19 1907  AST 34  ALT 28  ALKPHOS 134*  BILITOT 1.8*  PROT 7.5  ALBUMIN 4.1   No results for input(s): LIPASE, AMYLASE in the last 8760 hours. No results for input(s): AMMONIA in the last 8760 hours. CBC: Recent Labs    12/09/19 1907 12/10/19 1754  WBC 13.0* 9.6  NEUTROABS 9.9*  6.5  HGB 13.9 12.2  HCT 42.2 36.2  MCV 94.6 94.8  PLT 209 186   Lipid Panel: No results for input(s): CHOL, HDL, LDLCALC, TRIG, CHOLHDL, LDLDIRECT in the last 8760 hours. TSH: No results for input(s): TSH in the last 8760 hours. A1C: No results found for: HGBA1C   Assessment/Plan 1. Paroxysmal atrial fibrillation (HCC) - rate controlled with atenolol - cont eliquis for clot preventon  2. Essential hypertension - bp at goal < 150/90 - cont atenolol and irbesartan - Basic Metabolic Panel - CBC with Differential/Platelets  3. Mitral valve insufficiency, unspecified etiology - remains asymptomatic  4. Postoperative hypothyroidism - stable with levothyroxine - TSH- future  5. Gastroesophageal reflux disease without esophagitis - stable with omeprazole - continue to avoid food triggers  6. Memory changes - MMSE 29/30, correct clock and shapes - continues to feel forget at times  and write reminders often  7. Female bladder prolapse - followed by gynecology, appointment rescheduled to May - uncomfortable standing - continues to urinate often  8. Macular degeneration of both eyes, unspecified type - followed by Dr. Kathlen Whitehead, no changes with vision  9. Other insomnia - admits to taking double dose of temazepam once within last month - watches television late at night - gets about 7 hours of sleep/night - recommend avoiding electronics near bedtime - recommend reading before bedtime - recommend light exercise during the day - advised to take temazepam as prescribed  Total time 40 minutes. 50% of time spent spent doing patient counseling and coordination of care on insomnia and healthy sleeping habits.   Next appt: 03/29/2021 Kristen Whitehead, [facility] [phone]

## 2020-11-24 ENCOUNTER — Ambulatory Visit: Payer: Medicare Other | Admitting: Orthopedic Surgery

## 2020-11-24 LAB — CBC WITH DIFFERENTIAL/PLATELET
Absolute Monocytes: 684 cells/uL (ref 200–950)
Basophils Absolute: 23 cells/uL (ref 0–200)
Basophils Relative: 0.4 %
Eosinophils Absolute: 131 cells/uL (ref 15–500)
Eosinophils Relative: 2.3 %
HCT: 46.4 % — ABNORMAL HIGH (ref 35.0–45.0)
Hemoglobin: 15.3 g/dL (ref 11.7–15.5)
Lymphs Abs: 2405 cells/uL (ref 850–3900)
MCH: 30.8 pg (ref 27.0–33.0)
MCHC: 33 g/dL (ref 32.0–36.0)
MCV: 93.5 fL (ref 80.0–100.0)
MPV: 9.7 fL (ref 7.5–12.5)
Monocytes Relative: 12 %
Neutro Abs: 2457 cells/uL (ref 1500–7800)
Neutrophils Relative %: 43.1 %
Platelets: 246 10*3/uL (ref 140–400)
RBC: 4.96 10*6/uL (ref 3.80–5.10)
RDW: 13.4 % (ref 11.0–15.0)
Total Lymphocyte: 42.2 %
WBC: 5.7 10*3/uL (ref 3.8–10.8)

## 2020-11-24 LAB — BASIC METABOLIC PANEL
BUN: 12 mg/dL (ref 7–25)
CO2: 28 mmol/L (ref 20–32)
Calcium: 8.5 mg/dL — ABNORMAL LOW (ref 8.6–10.4)
Chloride: 100 mmol/L (ref 98–110)
Creat: 0.77 mg/dL (ref 0.60–0.88)
Glucose, Bld: 92 mg/dL (ref 65–99)
Potassium: 4.1 mmol/L (ref 3.5–5.3)
Sodium: 138 mmol/L (ref 135–146)

## 2020-12-04 ENCOUNTER — Telehealth: Payer: Self-pay

## 2020-12-04 NOTE — Telephone Encounter (Signed)
Patient would like to know if she needs to continue going to Neurologist yearly. She recently had an appointment and received no new diagnosis. Please advise.  Routed to [name], NP

## 2020-12-05 ENCOUNTER — Telehealth: Payer: Self-pay | Admitting: Neurology

## 2020-12-05 NOTE — Telephone Encounter (Signed)
We can reschedule in office appointment with me at her convenience.  We can also schedule phone appointment with one of our nurse practitioners at her convenience.

## 2020-12-05 NOTE — Telephone Encounter (Signed)
Thank you :)

## 2020-12-05 NOTE — Telephone Encounter (Signed)
Pt called, would like to speak with the nurse about scheduling telephone visit. I do not have transportation to come to the appt. Nothing has changed, I only need the visit to get my medication.

## 2020-12-05 NOTE — Telephone Encounter (Signed)
I called and discussed message. Pt declined appt to be made at this point. She and I discussed the option of have her PCP fill the med at this time since she has been stable. I advised pt if PCP was ok with refilling that was fine from our standpoint and we could see her back if needed. Pt sts she will call PCP's office and CB if she needs to continue seeing our office.

## 2020-12-07 ENCOUNTER — Other Ambulatory Visit: Payer: Self-pay | Admitting: *Deleted

## 2020-12-07 ENCOUNTER — Ambulatory Visit: Payer: Medicare Other | Admitting: Neurology

## 2020-12-07 NOTE — Patient Outreach (Signed)
[facility]) Care Management  12/07/2020  DAVETTE NUGENT [birth year] [id number]  Telephone Assessment-Successful-HF  RN spoke with pt today and received a update on pt's management of care. Pt doing well with no acute issues. Plan of care reviewed and discussed with noted updates as pt continue to manage her ongoing HF. Within the notation with daily reported weights and pt continue to manage well with no acute issues. Some LLE swelling as pt aware to elevate her leg to prevent ongoing swelling. No other issues noted.  Will scheduled next follow up call next month and continue to encourage adherence accordingly.Will also update pt's provider on pt's disposition and management of care with Dr. Cleophas Dunker.  Goals Addressed            This Visit's Progress   . COMPLETED: THN-Learn More About My Health       Follow Up Date 12/07/2020 Timeframe:  Long-Range Goal Priority:  Medium Start Date:     09/12/2020                        Expected End Date:  12/15/2020                   - tell my story and reason for my visit - make a list of questions - ask questions - repeat what I heard to make sure I understand - speak up when I don't understand    Why is this important?    The best way to learn about your health and care is by talking to the doctor and nurse.   They will answer your questions and give you information in the way that you like best.    Notes:   1/25-Will discuss pt's ongoing medical issues and how her medical conditions can affect her health. Always encourage pt on when to seek medical attention with her ongoing medical issues and encountered symptoms.    . THN-Track and Manage Fluids and Swelling   On track    Follow Up Date 01/05/2021 Timeframe:  Short-Term Goal Priority:  Medium Start Date:     08/02/2020                        Expected End Date:   01/16/2021                   - call office if I gain more than 2 pounds in one day or 5 pounds in one  week - keep legs up while sitting - track weight in diary - use salt in moderation - watch for swelling in feet, ankles and legs every day - weigh myself daily    Why is this important?   It is important to check your weight daily and watch how much salt and liquids you have.  It will help you to manage your heart failure.    Notes: 4/21-Pt continue to weigh daily with some swelling to her LLE however minimal. Pt aware to elevate her LE to reduce her ongoing swelling (history). Will continue to encourage adherence. 3/25-Pt reports some swelling over the last month with increase swelling to her LE 2.5 lbs overnight. Pt reports she will try to obtain a light version at a medical supply story. Also recommended a nursing supply store that maybe less expensive or [facility] [place] another resources. Pt aware to elevate throughout the day to prevent further swelling.  2/25-Reports  some fluid retention where her provided added additional medications for a few days until back at baseline. No additional symptoms as pt remains on track at this time. Will continue to encouraged ongoing management of care. 09/12/2020- Pt reports weights as followed: Today 128.8 lbs, Yesterday 129.6 lbs and last week at 131.4 lbs. Pt remains in the GREEN zone and remains symptoms free with no acute problems. Discussed baseline weights and ongoing monitoring system for prevention.  08/02/2020-Today weight 129.4 lbs, Yesterday 127.8 lbs and last week 127.2 lbs and pt remains asymptomatic with no swelling.     . THN-Track and Manage Symptoms   On track    Follow Up Date  01/05/2021  Timeframe:  Long-Range Goal Priority:  Medium Start Date:    08/02/2020                         Expected End Date:   03/16/2021                  - begin a heart failure diary - bring diary to all appointments - follow rescue plan if symptoms flare-up - know when to call the doctor - track symptoms and what helps feel better or worse     Why is this important?   You will be able to handle your symptoms better if you keep track of them.  Making some simple changes to your lifestyle will help.  Eating healthy is one thing you can do to take good care of yourself.    Notes:  4/21: Pt continues to do well in the GREEN zone however small amount of swelling to her LLE however minimal and she is aware to elevate this extremity. Reports today's weight 124 lbs, yesterday 125 lbs and last week 129 lbs since that time pt took extra fluid pill to reduce over time.   2/25-Pt verifies she remains in the GREEN zone at this time however episode with 3 lbs where her MD intervened with added medications until she returned to her baseline weight. This AM 127 lbs and yesterday 128.4 lbs with no additional symptoms.   09/12/2020-Verified pt remains in the GREEN zone with no acute symptoms. Will continue to offer [facility] calendar for ongoing documentation of all weights and/or symptoms.  Discuss the sones and what to do if acute symptoms occur.Will also alert pt on when to contact her provider with symptoms.   08/02/2020-Review the above symptoms and what to do by contacting her provider. Will verify pt remains in the GREEN zone with no acute symptoms.       Raina Mina, RN Care Management Coordinator [place] [facility] [phone]

## 2020-12-11 ENCOUNTER — Other Ambulatory Visit: Payer: Self-pay | Admitting: Cardiovascular Disease

## 2020-12-14 ENCOUNTER — Other Ambulatory Visit: Payer: Self-pay | Admitting: Cardiovascular Disease

## 2020-12-19 ENCOUNTER — Encounter: Payer: Self-pay | Admitting: Cardiovascular Disease

## 2020-12-19 ENCOUNTER — Ambulatory Visit: Payer: Medicare Other | Admitting: Cardiovascular Disease

## 2020-12-19 ENCOUNTER — Other Ambulatory Visit: Payer: Self-pay

## 2020-12-19 VITALS — BP 110/62 | HR 64 | Ht 60.0 in | Wt 128.0 lb

## 2020-12-19 DIAGNOSIS — R079 Chest pain, unspecified: Secondary | ICD-10-CM | POA: Diagnosis not present

## 2020-12-19 DIAGNOSIS — E7800 Pure hypercholesterolemia, unspecified: Secondary | ICD-10-CM

## 2020-12-19 DIAGNOSIS — I10 Essential (primary) hypertension: Secondary | ICD-10-CM

## 2020-12-19 DIAGNOSIS — I480 Paroxysmal atrial fibrillation: Secondary | ICD-10-CM

## 2020-12-19 NOTE — Assessment & Plan Note (Addendum)
Rates are well-controlled.  Continue Eliquis and atenolol.   This patients CHA2DS2-VASc Score and unadjusted Ischemic Stroke Rate (% per year) is equal to 4.8 % stroke rate/year from a score of 4  Above score calculated as 1 point each if present [CHF, HTN, DM, Vascular=MI/PAD/Aortic Plaque, Age if 65-74, or Female] Above score calculated as 2 points each if present [Age >75, or Stroke/TIA/TE]

## 2020-12-19 NOTE — Progress Notes (Signed)
Cardiology Office Note   Date:  12/19/2020   ID:  Kristen, Whitehead 1923-01-11, MRN [id number]  PCP:  [name], NP  Cardiologist:   [name], MD   Chief Complaint  Patient presents with  . Follow-up    2 months.  . Headache  . Shortness of Breath     History of Present Illness: Kristen Whitehead is a 87 y.o. female with chronic atrial fibrillation, hypertension, hyperlipidemia, mild MR and AR and chronic diastolic heart failure who presents for follow up. Kristen Whitehead was previously a patient of [name]. She last saw him 08/2015 for follow-up on frequent episodes of chest pain. She had been referred for stress testing but declined, as she has not tolerated Lexiscan well in the past. Troponin was checked and was negative. This is been a chronic problem so further evaluation was deferred. Her stress test in 2013 was negative for ischemia. She reports having "heart attacks" that occur when she overexerts herself. When this happens she gets pain across her chest that is associated with shortness of breath. She denies lower extremity edema, orthopnea or PND. She watched a documentary on broken heart syndrome and thinks this must be what happens to her. When she gets the pain she prays for God to take it away and eventually the pain subsides. There is no associated nausea or diaphoresis. Imdur was increased to 90 mg daily on 10/2015. Since then she had an episode of heart failure 01/2016 that responded to increased lasix. She also has chronic exertional dyspnea.  Kristen Whitehead, [place], on 12/11/16. Lasix was increased to 60 mg in the morning and 40 in the afternoon. There was no change in her symptoms.  She followed up with Kristen Flake, DNP on 01/2018 and her energy was worse but her BP was better. She felt better with physical activity. Her BP was low in the morning so Imdur was decreased to 60mg  and she started taking it in the  morning instead of the afternoon. At her last appointment her blood pressure was elevated so amlodipine was increased.  Since then her blood pressures been much better controlled.  She has struggled with losing strength over the last year.  She feels that the pandemic really set her back.  She had a fall several months ago.  She did some physical therapy but has not fully regained her strength.  She is using a cane for balance.  She is not getting as much exercise as she used to.  She was walking better prior to the fall and does not feel that she is ever fully recovered.  She has no chest pain other than GERD.  It was better controlled on ranitidine.  At her last appointment irbesartan was reduced due to low blood pressure. She was referred to [facility] for memory loss. MMSE was 29/30 so they didn't recommend any interventions.  Today, she is feeling fine. Last week 12/16/20 she noticed gaining 2.5 pounds overnight with LE edema in her feet, and is unsure of the cause. Intermittently she feels lower chest pain, but this has not occurred for a while. She is not able to climb steps or curbs without her cane, and has had several falls recently. Her most recent fall was at home down her porch stairs, and she fell backwards.  There was no preceding chest pain, palpitations, lightheadedness or dizziness.  However, she reports no injuries from her falls. At clinic today her  blood pressure is 110/62, and it has been stable since previous visits. When she gets up in the morning she feels ill and has loss of appetite for a few hours, followed by a lasting cough. These symptoms do not occur when she takes omeprazole, which she does regularly. Of note, a few months ago she developed a prolapsed bladder. The pressure from this is causing her to stay off of her feet more often. Currently she lives by herself and continues to drive. She denies any shortness of breath or palpitations. No syncope or  lightheadedness/dizziness to note.   Past Medical History:  Diagnosis Date  . Bladder prolapse, female, acquired   . Cancer ([place])    thyroid  . Chest pain    no known ischemic heart disease; negative Myoview July 2013. EF 75% with no ischemia.   . Chronic anticoagulation   . Chronic atrial fibrillation (HCC)    managed with rate control and coumadin  . Chronic diastolic heart failure ([place])   . GERD (gastroesophageal reflux disease)   . Heart disease   . History of congenital mitral regurgitation    mild  . History of heart attack    Multiple   . History of mitral valve prolapse 06/15/2008   a. echo 1/14: mild LVH, EF 60-65%, mild MR, mild to mod BAE, PASP 35  . Hypercholesterolemia   . Hypertension   . Hypothyroidism     Past Surgical History:  Procedure Laterality Date  . ABDOMINAL HYSTERECTOMY    . CATARACT EXTRACTION    . CHOLECYSTECTOMY    . Thyroidectomy    . TONSILLECTOMY       Current Outpatient Medications  Medication Sig Dispense Refill  . amLODipine (NORVASC) 2.5 MG tablet Take 5 mg by mouth daily.    Marland Kitchen atenolol (TENORMIN) 50 MG tablet TAKE 1 TABLET BY MOUTH  TWICE DAILY 180 tablet 3  . benzonatate (TESSALON) 100 MG capsule Take 1 capsule (100 mg total) by mouth every 8 (eight) hours as needed for cough. 21 capsule 0  . Calcium Carbonate-Vitamin D (CALCIUM 600+D PO) Take 1 tablet by mouth 2 (two) times daily.    . Cholecalciferol (VITAMIN D) 2000 UNITS tablet Take 4,000 Units by mouth daily.    . dorzolamide (TRUSOPT) 2 % ophthalmic solution Place 1 drop into the left eye 2 (two) times daily.    Marland Kitchen ELIQUIS 2.5 MG TABS tablet TAKE 1 TABLET BY MOUTH  TWICE DAILY 180 tablet 1  . furosemide (LASIX) 40 MG tablet Take 1 tablet (40 mg total) by mouth 2 (two) times daily. May take an additional 40 mg as needed for swelling or weight gain 195 tablet 3  . irbesartan (AVAPRO) 300 MG tablet Take 150 mg by mouth in the morning and at bedtime. 1/2 tablet daily    .  isosorbide mononitrate (IMDUR) 30 MG 24 hr tablet TAKE 1 TABLET BY MOUTH  DAILY 90 tablet 3  . latanoprost (XALATAN) 0.005 % ophthalmic solution Place 1 drop into both eyes at bedtime.     Marland Kitchen levothyroxine (SYNTHROID, LEVOTHROID) 100 MCG tablet Take 100 mcg by mouth daily.     . Loperamide HCl (IMODIUM PO) Take 1 capsule by mouth 2 (two) times daily.    Marland Kitchen MAGNESIUM OXIDE 400 PO Take 800 mg by mouth daily.    . Multiple Vitamins-Minerals (CENTRUM SILVER PO) Take 1 tablet by mouth daily.    . Multiple Vitamins-Minerals (PRESERVISION AREDS 2 PO) Take 1 capsule by mouth 2 (  two) times daily.     . nitroGLYCERIN (NITROSTAT) 0.4 MG SL tablet DISSOLVE 1 TABLET UNDER THE TONGUE EVERY 5 MINUTES FOR 3 DOSES AS NEEDED  FOR  CHEST  PAIN 50 tablet 2  . omeprazole (PRILOSEC) 40 MG capsule TAKE 1 CAPSULE BY MOUTH  DAILY 90 capsule 3  . potassium chloride (KLOR-CON) 10 MEQ tablet Take 10 mEq by mouth daily.    . pramipexole (MIRAPEX) 0.125 MG tablet TAKE 1 TABLET BY MOUTH IN  THE EVENING 90 tablet 0  . PREMARIN vaginal cream Place 2 g vaginally once a week. Uses on Sunday and wednesday    . Probiotic Product (PROBIOTIC DAILY PO) Take 1 tablet by mouth daily.    . sodium chloride (MURO 128) 5 % ophthalmic solution Place 1 drop into the right eye at bedtime.     . temazepam (RESTORIL) 15 MG capsule Take 15 mg by mouth at bedtime.     No current facility-administered medications for this visit.    Allergies:   Azithromycin, Compazine [prochlorperazine edisylate], Demerol, Dolophine [methadone], Ebastine, Erythromycin, Ibuprofen, Meperidine hcl, Morphine sulfate, Statins, Tetanus toxoids, Tetracycline, and Tetracyclines & related    Social History:  The patient  reports that she has never smoked. She has never used smokeless tobacco. She reports that she does not drink alcohol and does not use drugs.   Family History:  The patient's family history includes Stroke (age of onset: 46) in her mother; Stroke (age of  onset: 63) in her father.   ROS:   Please see the history of present illness. (+) Chest pain (+) LE edema (+) Falling (+) Prolapsed bladder (+) loss of appetite (+) cough All other systems are reviewed and negative.    PHYSICAL EXAM: VS:  BP 110/62 (BP Location: Left Arm, Patient Position: Sitting, Cuff Size: Normal)   Pulse 64   Ht 5' (1.524 m)   Wt 128 lb (58.1 kg)   BMI 25.00 kg/m  , BMI Body mass index is 25 kg/m. GENERAL:  Well appearing HEENT: Pupils equal round and reactive, fundi not visualized, oral mucosa unremarkable NECK: No JVD.  Waveform within normal limits, carotid upstroke brisk and symmetric, no bruits LUNGS:  Clear to auscultation bilaterally HEART:  Irregularly irregular.  PMI not displaced or sustained,S1 and S2 within normal limits, no S3, no S4, no clicks, no rubs, II/VI holosystolic murmur at the apex ABD:  Flat, positive bowel sounds normal in frequency in pitch, no bruits, no rebound, no guarding, no midline pulsatile mass, no hepatomegaly, no splenomegaly EXT:  2 plus pulses throughout, no LE edema no cyanosis no clubbing SKIN:  No rashes no nodules NEURO:  Cranial nerves II through XII grossly intact, motor grossly intact throughout PSYCH:  Cognitively intact, oriented to person place and time  EKG:   05/06/16: Atrial fibrillation rate 72 bpm 06/15/19: Atrial fibrillation.  Rate 72 bpm.   10/04/20: Atrial fibrillation.  Rate 67 bpm.   12/19/2020: EKG is not ordered today.   Echo 12/11/19:  1. Left ventricular ejection fraction, by estimation, is 70 to 75%. The  left ventricle has hyperdynamic function. The left ventricle has no  regional wall motion abnormalities. Left ventricular diastolic parameters  are indeterminate.  2. Right ventricular systolic function is normal. The right ventricular  size is normal.  3. Left atrial size was mildly dilated.  4. Right atrial size was mildly dilated.  5. The mitral valve is normal in structure.  Trivial mitral valve  regurgitation. No evidence  of mitral stenosis.  6. The aortic valve is tricuspid. Aortic valve regurgitation is trivial.  Mild to moderate aortic valve sclerosis/calcification is present, without  any evidence of aortic stenosis.  7. The inferior vena cava is dilated in size with <50% respiratory  variability, suggesting right atrial pressure of 15 mmHg.   Recent Labs: 01/27/2020: BNP 510.7 11/23/2020: BUN 12; Creat 0.77; Hemoglobin 15.3; Platelets 246; Potassium 4.1; Sodium 138    Lipid Panel    Component Value Date/Time   CHOL 211 (H) 11/04/2012 1524   TRIG 201.0 (H) 11/04/2012 1524   HDL 66.80 11/04/2012 1524   CHOLHDL 3 11/04/2012 1524   VLDL 40.2 (H) 11/04/2012 1524   LDLCALC 109 (H) 04/16/2012 1043   LDLDIRECT 98.8 11/04/2012 1524   03/28/16: Sodium 136, potassium 4.1, BUN 17, creatinine 0.74 AST 24, ALT 17 WBC 10, hemoglobin 15.7, hematocrit 46.8, platelets 291  07/21/15: TSH 1.52, free T4 1 0.25  04/13/15: Total cholesterol 235, triglycerides 150, HDL 66, LDL 139  03/08/15: BNP 601  Wt Readings from Last 3 Encounters:  12/19/20 128 lb (58.1 kg)  11/23/20 129 lb (58.5 kg)  10/20/20 130 lb 12.8 oz (59.3 kg)     ASSESSMENT AND PLAN: Atrial fibrillation (HCC) Rates are well-controlled.  Continue Eliquis and atenolol.   This patients CHA2DS2-VASc Score and unadjusted Ischemic Stroke Rate (% per year) is equal to 4.8 % stroke rate/year from a score of 4  Above score calculated as 1 point each if present [CHF, HTN, DM, Vascular=MI/PAD/Aortic Plaque, Age if 65-74, or Female] Above score calculated as 2 points each if present [Age >75, or Stroke/TIA/TE]   Essential hypertension BP is much more stable.  Continue amlodipine, atenolol, irbesartan and Imdur  Chest pain at rest Symptoms are stable.  Continue Imdur. She will resume taking her omeprazole.     # Chronic atrial fibrillation: Rate remainswell-controlled. Continue atenolol  and Eliquis.  She is on Eliquis to 2.5 mg given her weight less than 60 kg and age.  #Acute on chronic diastolic heart failure:  Mildly volume overloaded.  Increase lasix to 80mg  in the am and 40mg  in the evening for three days.    # Hyperlipidemia: LDL was 68 on 06/2018    Current medicines are reviewed at length with the patient today.  The patient does not have concerns regarding medicines.  The following changes have been made:  none  Labs/ tests ordered today include:   No orders of the defined types were placed in this encounter.    Disposition:   FU with Kristen Whitehead C. Oval Linsey, MD, [facility] in 6 months.    I,Mathew Stumpf,acting as a Education administrator for [name], MD.,have documented all relevant documentation on the behalf of [name], MD,as directed by  [name], MD while in the presence of [name], MD.  I, [place] Oval Linsey, MD have reviewed all documentation for this visit.  The documentation of the exam, diagnosis, procedures, and orders on 12/19/2020 are all accurate and complete.   Signed, Jatavion Peaster C. Oval Linsey, MD, [facility]  12/19/2020 5:42 PM    [place]

## 2020-12-19 NOTE — Assessment & Plan Note (Signed)
BP is much more stable.  Continue amlodipine, atenolol, irbesartan and Imdur

## 2020-12-19 NOTE — Assessment & Plan Note (Signed)
Symptoms are stable.  Continue Imdur. She will resume taking her omeprazole.

## 2020-12-19 NOTE — Patient Instructions (Signed)
Medication Instructions:  Your physician recommends that you continue on your current medications as directed. Please refer to the Current Medication list given to you today.   *If you need a refill on your cardiac medications before your next appointment, please call your pharmacy*  Lab Work: NONE  Testing/Procedures: NONE  Follow-Up: At [facility], you and your health needs are our priority.  As part of our continuing mission to provide you with exceptional heart care, we have created designated Provider Care Teams.  These Care Teams include your primary Cardiologist (physician) and Advanced Practice Providers (APPs -  Physician Assistants and Nurse Practitioners) who all work together to provide you with the care you need, when you need it.  We recommend signing up for the patient portal called "MyChart".  Sign up information is provided on this After Visit Summary.  MyChart is used to connect with patients for Virtual Visits (Telemedicine).  Patients are able to view lab/test results, encounter notes, upcoming appointments, etc.  Non-urgent messages can be sent to your provider as well.   To learn more about what you can do with MyChart, go to [email or web address].    Your next appointment:   6 month(s)  The format for your next appointment:   In Person  Provider:   DR [place]

## 2020-12-22 ENCOUNTER — Telehealth: Payer: Self-pay | Admitting: Cardiovascular Disease

## 2020-12-22 NOTE — Telephone Encounter (Signed)
*  STAT* If patient is at the pharmacy, call can be transferred to refill team.   1. Which medications need to be refilled? (please list name of each medication and dose if known) furosemide (LASIX) 40 MG tablet  2. Which pharmacy/location (including street and city if local pharmacy) is medication to be sent to? [facility] - [place], [place] - [address], [address]  3. Do they need a 30 day or 90 day supply? 90 day supply  Patient states she is completely out of medication.

## 2020-12-25 MED ORDER — FUROSEMIDE 40 MG PO TABS: 40.0000 mg | ORAL_TABLET | Freq: Two times a day (BID) | ORAL | 3 refills | Status: DC

## 2021-01-05 ENCOUNTER — Other Ambulatory Visit: Payer: Self-pay | Admitting: *Deleted

## 2021-01-05 NOTE — Patient Outreach (Signed)
[place] [facility]) Care Management  01/05/2021  Kristen Whitehead 1922/09/28 [id number]   Telephone Assessment: Requested a call back Monday  RN spoke briefly with pt's who was expecting company today and requested a call back Monday.    Will follow up on Monday with pt concerning her ongoing management   Raina Mina, RN Care Management Coordinator [place] Office [phone]

## 2021-01-08 ENCOUNTER — Other Ambulatory Visit: Payer: Self-pay | Admitting: *Deleted

## 2021-01-08 NOTE — Patient Outreach (Signed)
[place] [facility]) Care Management  01/08/2021  Kristen Whitehead 1923-07-15 [id number]   Telephone Assessment-Unsuccessful  Spoke with pt today who was in the middle of assisting her neighbor and requested a call back tomorrow.   RN will follow up once again on tomorrow as requested to received an update on pt's ongoing management of care.  Raina Mina, RN Care Management Coordinator [place] Office [phone]

## 2021-01-09 ENCOUNTER — Telehealth: Payer: Self-pay | Admitting: Cardiovascular Disease

## 2021-01-09 MED ORDER — FUROSEMIDE 40 MG PO TABS
40.0000 mg | ORAL_TABLET | Freq: Two times a day (BID) | ORAL | 3 refills | Status: DC
Start: 2021-01-09 — End: 2022-02-18

## 2021-01-09 NOTE — Telephone Encounter (Signed)
Pt c/o medication issue:  1. Name of Medication: furosemide (LASIX) 40 MG tablet  2. How are you currently taking this medication (dosage and times per day)?  Take 1 tablet (40 mg total) by mouth 2 (two) times daily. May take an additional 40 mg as needed for swelling or weight gain  3. Are you having a reaction (difficulty breathing--STAT)? No   4. What is your medication issue?  [facility] have never fill this prescription for this patient and they need a script so it can be filled at [phone]

## 2021-01-09 NOTE — Telephone Encounter (Signed)
Spoke with Donia Guiles, pharmacist at [facility] who states patient never received her furosemide from [facility] and is requesting to get it filled at [facility]. I advised that Rx was sent to [facility] on 5/9. Donia Guiles states that patient did not receive the medication. I advised that I can reorder medication to be filled at [facility] agrees to contact us if insurance denies the request. She thanked me for my help.

## 2021-01-18 DIAGNOSIS — L602 Onychogryphosis: Secondary | ICD-10-CM | POA: Diagnosis not present

## 2021-01-18 DIAGNOSIS — L84 Corns and callosities: Secondary | ICD-10-CM | POA: Diagnosis not present

## 2021-01-18 DIAGNOSIS — I70293 Other atherosclerosis of native arteries of extremities, bilateral legs: Secondary | ICD-10-CM | POA: Diagnosis not present

## 2021-01-23 ENCOUNTER — Other Ambulatory Visit: Payer: Self-pay | Admitting: Cardiovascular Disease

## 2021-01-24 ENCOUNTER — Other Ambulatory Visit: Payer: Self-pay | Admitting: *Deleted

## 2021-01-24 MED ORDER — TEMAZEPAM 15 MG PO CAPS: 15.0000 mg | ORAL_CAPSULE | Freq: Every day | ORAL | 1 refills | Status: DC

## 2021-01-24 NOTE — Telephone Encounter (Signed)
[name] requested refill.  Pended Rx and sent to [facility] for approval.

## 2021-01-25 ENCOUNTER — Telehealth: Payer: Self-pay | Admitting: Cardiovascular Disease

## 2021-01-25 NOTE — Telephone Encounter (Signed)
   Pt c/o BP issue: STAT if pt c/o blurred vision, one-sided weakness or slurred speech  1. What are your last 5 BP readings?  103/62 106/64 This morning - 102/58 -- not taking her medication yet today  2. Are you having any other symptoms (ex. Dizziness, headache, blurred vision, passed out)?   3. What is your BP issue? Pt is wondering if she can stop or decreased some her medication, she said he BP been pretty good and she's been feeling good for the last few month. She wanted to check in with Dr. Oval Linsey if she will changed her medications

## 2021-01-25 NOTE — Telephone Encounter (Signed)
Returned call to patient who states that her blood pressure has been really good. Recent BP readings are 103/62, 106/64, 102/58 the last few days before taking her medications. Patient states that she is concerned that its running a little low. Patient states that she does currently feel well and denies shortness of breath, dizziness, lightheadedness, or chest pain. Patient states that a couple months ago she had some dizziness but states that it cleared up with her sitting and resting and has had not further issues with it. Patient states that she did have some issues with her oxygen level dropping to the high 80's but states that it always comes up to the 90's. Patient states currently she feels well but wanted to know if Dr. Oval Linsey wanted to adjust any of her medications. Advised patient that I would forward message to Dr. Oval Linsey for review and advice. Patient verbalized understanding.  Made patient aware of ED precautions should new or worsening symptoms develop. Patient verbalized understanding.

## 2021-01-26 ENCOUNTER — Other Ambulatory Visit: Payer: Medicare Other | Admitting: *Deleted

## 2021-01-26 NOTE — Patient Outreach (Signed)
[facility]) Care Management  01/26/2021  JAIONA SIMIEN [birth year] [id number]   Telephone Assessment-Successful-Heart Failure  RN spoke with pt today with update on her ongoing management of care. Pt reports she continues to do well  with no acute symptoms. Pt remains in the GREEN zone with no swelling and plan of care was reviewed and discussed with pt's weights over the last week (124.4 lbs). Pt also states her blood pressures was good at 114-118/66. States she was worried that after her medications her blood pressure would drop to 94/59. States she called her provider and informed to continue taking her blood pressure medication. RN encouraged pt if she is concerning and not feeling stable with the lower blood pressure after her medications to call her cardiologist back and request parameters or baseline on medication or possible reduction. Pt states she would reach out to her CAD provider for possible interventions.   Will follow up next month with ongoing management of care. Reviewed and discussed the current plan of care along with all goals and interventions updated accordingly. Will update her provider with pt's disposition with [facility] services.    Goals Addressed             This Visit's Progress    THN-Track and Manage Fluids and Swelling   On track    Follow Up Date 03/02/2021 Timeframe:  Short-Term Goal Priority:  Medium Start Date:     08/02/2020                        Expected End Date:   03/16/2021                   - call office if I gain more than 2 pounds in one day or 5 pounds in one week - keep legs up while sitting - track weight in diary - use salt in moderation - watch for swelling in feet, ankles and legs every day - weigh myself daily    Why is this important?   It is important to check your weight daily and watch how much salt and liquids you have.  It will help you to manage your heart failure.   Barriers: Health Behaviors   Notes: 6/10-Pt confirm her weights yesterday and today at 124. 4 lbs and last week 120 lbs with no residual symptoms. Denies any swelling to her LE and pt remains in the GREEN zone with no acute symptoms.  4/21-Pt continue to weigh daily with some swelling to her LLE however minimal. Pt aware to elevate her LE to reduce her ongoing swelling (history). Will continue to encourage adherence. 3/25-Pt reports some swelling over the last month with increase swelling to her LE 2.5 lbs overnight. Pt reports she will try to obtain a light version at a medical supply story. Also recommended a nursing supply store that maybe less expensive or [facility] [place] another resources. Pt aware to elevate throughout the day to prevent further swelling.  2/25-Reports some fluid retention where her provided added additional medications for a few days until back at baseline. No additional symptoms as pt remains on track at this time. Will continue to encouraged ongoing management of care. 09/12/2020- Pt reports weights as followed: Today 128.8 lbs, Yesterday 129.6 lbs and last week at 131.4 lbs. Pt remains in the GREEN zone and remains symptoms free with no acute problems. Discussed baseline weights and ongoing monitoring system for prevention.  08/02/2020-Today weight  129.4 lbs, Yesterday 127.8 lbs and last week 127.2 lbs and pt remains asymptomatic with no swelling.       THN-Track and Manage Symptoms   On track    Follow Up Date  03/02/2021  Timeframe:  Long-Range Goal Priority:  Medium Start Date:    08/02/2020                         Expected End Date:   03/16/2021                  - begin a heart failure diary - bring diary to all appointments - follow rescue plan if symptoms flare-up - know when to call the doctor - track symptoms and what helps feel better or worse    Why is this important?   You will be able to handle your symptoms better if you keep track of them.  Making some simple changes  to your lifestyle will help.  Eating healthy is one thing you can do to take good care of yourself.   Barriers: Health Behaviors  Notes:  4/21: Pt continues to do well in the GREEN zone however small amount of swelling to her LLE however minimal and she is aware to elevate this extremity. Reports today's weight 124 lbs, yesterday 125 lbs and last week 129 lbs since that time pt took extra fluid pill to reduce over time.   2/25-Pt verifies she remains in the GREEN zone at this time however episode with 3 lbs where her MD intervened with added medications until she returned to her baseline weight. This AM 127 lbs and yesterday 128.4 lbs with no additional symptoms.   09/12/2020-Verified pt remains in the GREEN zone with no acute symptoms. Will continue to offer [facility] calendar for ongoing documentation of all weights and/or symptoms.  Discuss the sones and what to do if acute symptoms occur.Will also alert pt on when to contact her provider with symptoms.   08/02/2020-Review the above symptoms and what to do by contacting her provider. Will verify pt remains in the GREEN zone with no acute symptoms.          Raina Mina, RN Care Management Coordinator [place] Office [phone]

## 2021-01-29 ENCOUNTER — Telehealth: Payer: Self-pay | Admitting: Cardiovascular Disease

## 2021-01-29 MED ORDER — IRBESARTAN 150 MG PO TABS
150.0000 mg | ORAL_TABLET | Freq: Every day | ORAL | Status: DC
Start: 2021-01-29 — End: 2021-02-15

## 2021-01-29 NOTE — Telephone Encounter (Signed)
Those numbers are good and I am glad she is feeling well.  However, I agree given her age it might be a little low.  Lets have her take her irbesartan once per day instead of twice per day.  Keep checking to make sure it is staying less than 130/80.

## 2021-01-29 NOTE — Telephone Encounter (Signed)
Spoke with pt, Aware of dr Blenda Mounts recommendations. She reports she is already taking irbesartan 150 mg once daily already. All medications confirmed. Aware will make dr Oval Linsey aware.

## 2021-01-29 NOTE — Telephone Encounter (Signed)
Attempted to contact pt. Received a message stating the mailbox is currently not accepting messages.

## 2021-01-29 NOTE — Telephone Encounter (Signed)
Pt c/o medication issue:  1. Name of Medication: atenolol (TENORMIN) 50 MG tablet amLODipine (NORVASC) 2.5 MG tablet isosorbide mononitrate (IMDUR) 30 MG 24 hr tablet ELIQUIS 2.5 MG TABS tablet irbesartan (AVAPRO) 300 MG tablet 2. How are you currently taking this medication (dosage and times per day)? As prescribed  3. Are you having a reaction (difficulty breathing--STAT)?   4. What is your medication issue? PT is calling with concerns that the medication is making her BP decrease and she is not feeling well at all 142/75 97/65 89/53  105/65

## 2021-01-30 ENCOUNTER — Other Ambulatory Visit: Payer: Self-pay | Admitting: *Deleted

## 2021-01-30 NOTE — Telephone Encounter (Signed)
This RN called patient back, patient reports over the past weekend her blood pressure was low (89/53, around the 90s/50s), patient reports she has not changed anything about her lifestyle and was concerned. Pt denies chest pain, denies headache. Pt reports occasional dizziness and shortness of breath.   Pt took blood pressure this morning, reported 126/51, this RN had patient check BP while on the phone, pt reports 122/63. This RN checked with DOD Dr. Oval Linsey (also pt's primary cardiologist), Dr. Oval Linsey does not think pt's symptoms are related to blood pressure, advised patient to continue medication regimen as she has taken it today, and to follow up with primary care. Patient verbalized understanding of Dr. Blenda Mounts instructions. Pt reports she follows blood pressure closely and will continue to self-monitor.   This RN also advised patient that if she should experience symptoms such as chest pain, shortness of breath, dizziness, or any other troubling symptoms, she should call 911 or have someone take her to the emergency department. Pt verbalized understanding.

## 2021-01-30 NOTE — Telephone Encounter (Signed)
Pt is returning call from yesterday concerning her BP. Please advise

## 2021-02-06 DIAGNOSIS — N8182 Incompetence or weakening of pubocervical tissue: Secondary | ICD-10-CM | POA: Diagnosis not present

## 2021-02-09 ENCOUNTER — Other Ambulatory Visit: Payer: Self-pay | Admitting: Neurology

## 2021-02-13 ENCOUNTER — Telehealth: Payer: Self-pay

## 2021-02-13 NOTE — Telephone Encounter (Signed)
Did you check your blood pressure before or after taking your medication?

## 2021-02-13 NOTE — Telephone Encounter (Signed)
Please follow up at the office to check your blood pressure.Bring all your medication.

## 2021-02-13 NOTE — Telephone Encounter (Signed)
Patient says she is doing both, sometimes checking BP before and sometimes after medications. Yesterday before meds it was 126/74. A couple hours later it was 105/55 after taking medication. At 1a 103/64 before night meds (she is a "night owl").   Cardiologist told her to cut in half although it was one she had already started cutting in half. Patient was confused as to if she should cut it again and was told to contact us.   She is working on switching to a different cardiologist in the [place] area. Currently she is taking Amlodipine 2.5mg  2 tabs daily (instead of 5mg  pills) and the atenolol 1BID.  To [employer]

## 2021-02-13 NOTE — Telephone Encounter (Signed)
Patient called stating she is a new patient of Amy Cleophas Dunker and her BP was low but she is still taking BP med. Patient states her cardiologist says to contact us re: her low BP 90s/50s  [phone] leave message.  To [employer]

## 2021-02-14 ENCOUNTER — Telehealth: Payer: Self-pay | Admitting: *Deleted

## 2021-02-14 NOTE — Telephone Encounter (Signed)
Patient notified and agreed. Confirmed appointment for tomorrow with Amy.

## 2021-02-14 NOTE — Telephone Encounter (Signed)
Discussed with the patient. She has an apnt for tomorrow. I advised patient to bring all medications and supplements.

## 2021-02-14 NOTE — Telephone Encounter (Signed)
Directions for lasix: take 40 mg furosemide (1 tablet) two times daily, she may take a extra dose for swelling or weight gain. Please reassure her it is ok to take a extra dose and to watch salt intake. Thank you.

## 2021-02-14 NOTE — Telephone Encounter (Signed)
Patient called and stated that she wanted to let you know that she has gained 2 pounds over night and wants to know if you want her to Increase her diuretic.  Please Advise.

## 2021-02-15 ENCOUNTER — Encounter: Payer: Self-pay | Admitting: Orthopedic Surgery

## 2021-02-15 ENCOUNTER — Ambulatory Visit (INDEPENDENT_AMBULATORY_CARE_PROVIDER_SITE_OTHER): Payer: Medicare Other | Admitting: Orthopedic Surgery

## 2021-02-15 ENCOUNTER — Other Ambulatory Visit: Payer: Self-pay

## 2021-02-15 VITALS — BP 120/70 | HR 63 | Temp 96.9°F | Ht 60.0 in | Wt 129.0 lb

## 2021-02-15 DIAGNOSIS — M25472 Effusion, left ankle: Secondary | ICD-10-CM

## 2021-02-15 DIAGNOSIS — I952 Hypotension due to drugs: Secondary | ICD-10-CM | POA: Diagnosis not present

## 2021-02-15 DIAGNOSIS — M25471 Effusion, right ankle: Secondary | ICD-10-CM

## 2021-02-15 MED ORDER — IRBESARTAN 150 MG PO TABS: 75.0000 mg | ORAL_TABLET | Freq: Every day | ORAL | 1 refills | Status: DC

## 2021-02-15 NOTE — Progress Notes (Signed)
Careteam: Patient Care Team: Kristen Alanis, NP as PCP - General (Adult Health Nurse Practitioner) [name], MD as PCP - Cardiology (Cardiology) [name], RN as [place] [facility]  Seen by: [name], AGNP-C  PLACE OF SERVICE:  [place]  Advanced Directive information    Allergies  Allergen Reactions   Azithromycin Nausea Only and Other (See Comments)   Compazine [Prochlorperazine Edisylate] Nausea Only   Demerol Nausea Only   Dolophine [Methadone] Nausea Only   Ebastine Nausea Only    EBS   Erythromycin Nausea Only and Other (See Comments)   Ibuprofen Nausea Only and Other (See Comments)   Meperidine Hcl Other (See Comments)   Morphine Sulfate Other (See Comments)   Statins Other (See Comments)    myalgias   Tetanus Toxoids Nausea Only   Tetracycline Other (See Comments)   Tetracyclines & Related Nausea Only    Chief Complaint  Patient presents with   Medical Management of Chronic Issues    Patient presents today for hypertension follow-up. She reports her blood pressure has started lowing after taking medication. She reports readings in the  90's/50's.     HPI: Patient is a 87 y.o. female seen today for low blood pressure.   She takes her blood pressure everyday 2-3x. She will check her pressure prior to taking medications and a few hours after. She has noticed low bp readings after taking medications, averaging SBP 100-90. Blood pressure medications include: atenolol 50 mg bid, irbesartan 150 mg daily, and Imdur 30 mg daily. Reports not feeling herself at times and lightheaded. No recent falls or injuries, ambulates with cane.   Denies chest pain, sob or blood in stool.   Reports increased ankle swelling about 3 days ago. She normally takes lasix 40 mg bid. 06/27, she took a additional Lasix 40 mg for ankle edema. She has been fearful on taking a additional dose due to low blood pressures. Follows low sodium diet. Elevates  legs a few times a day.    Received  2nd covid booster 2 weeks ago.   Review of Systems:  Review of Systems  Constitutional:  Negative for chills, fever, malaise/fatigue and weight loss.  Respiratory:  Negative for cough, shortness of breath and wheezing.   Cardiovascular:  Positive for leg swelling. Negative for chest pain.  Gastrointestinal:  Negative for blood in stool.  Musculoskeletal:  Negative for falls.  Neurological:  Positive for dizziness. Negative for weakness.  Psychiatric/Behavioral:  Negative for depression. The patient is nervous/anxious.    Past Medical History:  Diagnosis Date   Bladder prolapse, female, acquired    Cancer [facility])    thyroid   Chest pain    no known ischemic heart disease; negative Myoview July 2013. EF 75% with no ischemia.    Chronic anticoagulation    Chronic atrial fibrillation (HCC)    managed with rate control and coumadin   Chronic diastolic heart failure (HCC)    GERD (gastroesophageal reflux disease)    Heart disease    History of congenital mitral regurgitation    mild   History of heart attack    Multiple    History of mitral valve prolapse 06/15/2008   a. echo 1/14: mild LVH, EF 60-65%, mild MR, mild to mod BAE, PASP 35   Hypercholesterolemia    Hypertension    Hypothyroidism    Past Surgical History:  Procedure Laterality Date   ABDOMINAL HYSTERECTOMY     CATARACT EXTRACTION  CHOLECYSTECTOMY     Thyroidectomy     TONSILLECTOMY     Social History:   reports that she has never smoked. She has never used smokeless tobacco. She reports that she does not drink alcohol and does not use drugs.  Family History  Problem Relation Age of Onset   Stroke Mother 49   Stroke Father 46   Heart attack Neg Hx    Heart disease Neg Hx     Medications: Patient's Medications  New Prescriptions   No medications on file  Previous Medications   AMLODIPINE (NORVASC) 2.5 MG TABLET    Take 5 mg by mouth daily.   ATENOLOL (TENORMIN)  50 MG TABLET    TAKE 1 TABLET BY MOUTH  TWICE DAILY   BENZONATATE (TESSALON) 100 MG CAPSULE    Take 1 capsule (100 mg total) by mouth every 8 (eight) hours as needed for cough.   CALCIUM CARBONATE-VITAMIN D (CALCIUM 600+D PO)    Take 1 tablet by mouth 2 (two) times daily.   CHOLECALCIFEROL (VITAMIN D) 2000 UNITS TABLET    Take 4,000 Units by mouth daily.   DORZOLAMIDE (TRUSOPT) 2 % OPHTHALMIC SOLUTION    Place 1 drop into the left eye 2 (two) times daily.   ELIQUIS 2.5 MG TABS TABLET    TAKE 1 TABLET BY MOUTH  TWICE DAILY   FUROSEMIDE (LASIX) 40 MG TABLET    Take 1 tablet (40 mg total) by mouth 2 (two) times daily. May take an additional 40 mg as needed for swelling or weight gain   IRBESARTAN (AVAPRO) 150 MG TABLET    Take 1 tablet (150 mg total) by mouth daily.   ISOSORBIDE MONONITRATE (IMDUR) 30 MG 24 HR TABLET    TAKE 1 TABLET BY MOUTH  DAILY   LATANOPROST (XALATAN) 0.005 % OPHTHALMIC SOLUTION    Place 1 drop into both eyes at bedtime.    LEVOTHYROXINE (SYNTHROID, LEVOTHROID) 100 MCG TABLET    Take 100 mcg by mouth daily.    LOPERAMIDE HCL (IMODIUM PO)    Take 1 capsule by mouth 2 (two) times daily.   MAGNESIUM OXIDE 400 PO    Take 800 mg by mouth daily.   MULTIPLE VITAMINS-MINERALS (CENTRUM SILVER PO)    Take 1 tablet by mouth daily.   MULTIPLE VITAMINS-MINERALS (PRESERVISION AREDS 2 PO)    Take 1 capsule by mouth 2 (two) times daily.    NITROGLYCERIN (NITROSTAT) 0.4 MG SL TABLET    DISSOLVE 1 TABLET UNDER THE TONGUE EVERY 5 MINUTES FOR 3 DOSES AS NEEDED  FOR  CHEST  PAIN   OMEPRAZOLE (PRILOSEC) 40 MG CAPSULE    TAKE 1 CAPSULE BY MOUTH  DAILY   POTASSIUM CHLORIDE (KLOR-CON) 10 MEQ TABLET    Take 10 mEq by mouth daily.   PRAMIPEXOLE (MIRAPEX) 0.125 MG TABLET    TAKE 1 TABLET BY MOUTH IN  THE EVENING   PREMARIN VAGINAL CREAM    Place 2 g vaginally once a week. Uses on Sunday and wednesday   PROBIOTIC PRODUCT (PROBIOTIC DAILY PO)    Take 1 tablet by mouth daily.   SODIUM CHLORIDE (MURO 128) 5  % OPHTHALMIC SOLUTION    Place 1 drop into the right eye at bedtime.    TEMAZEPAM (RESTORIL) 15 MG CAPSULE    Take 1 capsule (15 mg total) by mouth at bedtime.  Modified Medications   No medications on file  Discontinued Medications   No medications on file  Physical Exam:  Vitals:   02/15/21 1110  BP: 120/70  Pulse: 63  Temp: (!) 96.9 F (36.1 C)  TempSrc: Temporal  SpO2: 96%  Weight: 129 lb (58.5 kg)  Height: 5' (1.524 m)   Body mass index is 25.19 kg/m. Wt Readings from Last 3 Encounters:  02/15/21 129 lb (58.5 kg)  12/19/20 128 lb (58.1 kg)  11/23/20 129 lb (58.5 kg)    Physical Exam Vitals reviewed.  Constitutional:      General: She is not in acute distress. HENT:     Head: Normocephalic.  Cardiovascular:     Rate and Rhythm: Normal rate. Rhythm irregular.     Pulses: Normal pulses.     Heart sounds: Normal heart sounds. No murmur heard. Pulmonary:     Effort: Pulmonary effort is normal. No respiratory distress.     Breath sounds: Normal breath sounds. No wheezing.  Musculoskeletal:     Right lower leg: Edema present.     Left lower leg: Edema present.     Comments: +1 pitting   Skin:    General: Skin is warm and dry.     Capillary Refill: Capillary refill takes less than 2 seconds.  Neurological:     General: No focal deficit present.     Mental Status: She is alert and oriented to person, place, and time.     Motor: Weakness present.     Gait: Gait abnormal.     Comments: Cane  Psychiatric:        Mood and Affect: Mood normal.        Behavior: Behavior normal.    Labs reviewed: Basic Metabolic Panel: Recent Labs    11/23/20 1417  NA 138  K 4.1  CL 100  CO2 28  GLUCOSE 92  BUN 12  CREATININE 0.77  CALCIUM 8.5*   Liver Function Tests: No results for input(s): AST, ALT, ALKPHOS, BILITOT, PROT, ALBUMIN in the last 8760 hours. No results for input(s): LIPASE, AMYLASE in the last 8760 hours. No results for input(s): AMMONIA in the last  8760 hours. CBC: Recent Labs    11/23/20 1417  WBC 5.7  NEUTROABS 2,457  HGB 15.3  HCT 46.4*  MCV 93.5  PLT 246   Lipid Panel: No results for input(s): CHOL, HDL, LDLCALC, TRIG, CHOLHDL, LDLDIRECT in the last 8760 hours. TSH: No results for input(s): TSH in the last 8760 hours. A1C: No results found for: HGBA1C   Assessment/Plan 1. Hypotension due to drugs - reports SBP 100-90, feeling lightheaded after taking bp meds - will reduce irbesartan from 150 mg to 75 mg  - advised to reach out to cardiology is bp remains SBP < 100 and symptomatic - irbesartan (AVAPRO) 150 MG tablet; Take 0.5 tablets (75 mg total) by mouth daily.  Dispense: 90 tablet; Refill: 1  2. Ankle edema, bilateral - +1 pitting edema - cont lasix 40 mg bid, may take additional 40 mg for increased edema - cont low salt diet - elevate lower legs 2-4 hours daily  Total time: 22 minutes. Greater than 50% of total time spent doing patient education about taking blood pressure and medication management.   Next appt: 04/12/2021  [name], [facility] [phone]

## 2021-02-15 NOTE — Patient Instructions (Signed)
We will reduce your Irbesartan to 75 mg daily. ( 1/2 of the pills you have right now).   Watch salt intake. Elevate legs a few hours a day.   Please take blood pressure 2 times a day- AFTER taking blood pressure medication. IF blood pressures consistently < 100/60 please contact cardiologist.

## 2021-03-02 ENCOUNTER — Ambulatory Visit: Payer: Self-pay | Admitting: *Deleted

## 2021-03-08 ENCOUNTER — Other Ambulatory Visit: Payer: Self-pay | Admitting: Orthopedic Surgery

## 2021-03-08 ENCOUNTER — Telehealth: Payer: Self-pay | Admitting: Orthopedic Surgery

## 2021-03-08 ENCOUNTER — Telehealth: Payer: Self-pay | Admitting: Cardiovascular Disease

## 2021-03-08 DIAGNOSIS — I10 Essential (primary) hypertension: Secondary | ICD-10-CM

## 2021-03-08 DIAGNOSIS — I480 Paroxysmal atrial fibrillation: Secondary | ICD-10-CM

## 2021-03-08 NOTE — Telephone Encounter (Signed)
I recommend she schedule appointment with one of our providers today for tomorrow. If she has trouble breathing she should report to the ED for further evaluation.

## 2021-03-08 NOTE — Telephone Encounter (Signed)
Patient notified and scheduled an appointment for 7/22 with Dinah at 10:30am

## 2021-03-08 NOTE — Telephone Encounter (Signed)
I have spoken with [name], she would like to be seen tomorrow at our office. She is also requesting referral for new cardiologist. I will place orders for referral.

## 2021-03-08 NOTE — Telephone Encounter (Signed)
Patient called and wants to discuss with Amy the medication that she takes for her bp and she said she had a fall the other night in the bathroom and patient just wants to discuss what's been going on with this new medication, She is having trouble getting her oxygen level up to 90.

## 2021-03-08 NOTE — Telephone Encounter (Signed)
Spoke with pt, aware of dr Kathalene Frames recommendations. Will forward to dr o'neal's nurse to see about getting her scheduled to see Dr Audie Box.

## 2021-03-08 NOTE — Telephone Encounter (Signed)
Spoke with pt, she is having low bp and her medical doctor decrease her irbesartan to 75 mg once daily and she continues to have low readings. She does report she take about 9 medication in the morning. Encouraged patient to take the amlodipine at bedtime so she does not get such a bang first thing in the morning, she agreed to try this. She wants to know if there are any medications that she needs to stop. Aware dr Oval Linsey is working in the hospital this week but will forward for her review. She is also wanting to change to dr Audie Box, she is not able to drive to the [place] location. Aware will get approval for the change.

## 2021-03-08 NOTE — Telephone Encounter (Signed)
Patient stated that she would like to speak with Amy.  Stated that she had changed her medication and her blood pressure is still low 95/52 yesterday, 89/56 on Tuesday and this morning it was 136/72.   Stated that she has called her Cardiologist but they told her to call her PCP.   138/76 when she gets up in the morning and then she takes blood pressure medications and rechecks an hour later and its 95/52 this morning. Stated that this makes her weak and not feel good.   Please Advise.

## 2021-03-08 NOTE — Telephone Encounter (Signed)
Patient states she needs her  Pt c/o BP issue: STAT if pt c/o blurred vision, one-sided weakness or slurred speech  1. What are your last 5 BP readings? Yesterday 95/52 after taking medications, 89/66 the other day, states it has been in ranging around 90's  2. Are you having any other symptoms (ex. Dizziness, headache, blurred vision, passed out)? Gets weak when getting up and down   3. What is your BP issue? Patient states she does not think she needs all these high BP medications. She states her BP has been extremely low and she is very frustrated. She states she would appreciate someone calling her back. She says she had called about her BP before and was told to call her PCP. She states her PCP cut one of her medications back and to call her cardiologist if it did not help. She states it has not helped. She states she just needs someone to adjust her medication and tell her to stop or cut back a medication. Patient was very frustrated on the phone. She states she also has been weak when getting up and down. She says she fell the other day getting up from the bathroom. She states she is not calling about the fall. She states she just wants her medication adjusted.

## 2021-03-09 ENCOUNTER — Ambulatory Visit: Payer: Self-pay | Admitting: Family

## 2021-03-13 NOTE — Telephone Encounter (Signed)
Returned the call to the patient. She was calling to give an update with her blood pressure readings and to state she was feeling better.   She does not need a call back.

## 2021-03-13 NOTE — Telephone Encounter (Signed)
Patient calling back. She would like to know how long since cutting back her medication should it start making a difference. She says her BP today has been 122/64 and 114/66 She says yesterday it was 112/65 before medication and 106/62 after.

## 2021-03-15 ENCOUNTER — Other Ambulatory Visit: Payer: Self-pay | Admitting: *Deleted

## 2021-03-15 NOTE — Patient Outreach (Signed)
[facility]) Care Management  03/15/2021  Kristen Whitehead 1923-02-16 [id number]  Telephone Assessment-Successful-HF  RN reported an update and RN placed on the plan of care. Pt reports no fluid retention or symptoms as pt remains in the GREEN zone. Discussed all goals and interventions with no acute events. Added HTN to the plan of care due to  the recent interventions with pt's blood pressures. Pt reports her blood pressures  is stable at this time pending an appointment  to her new CAD provider and changes with her medications.   Will follow up next month with ongoing case management needs.     Goals Addressed             This Visit's Progress    THN-Track and Manage Fluids and Swelling   On track    Follow Up Date 04/13/2021 Timeframe:  Short-Term Goal Priority:  Medium Start Date:     08/02/2020                        Expected End Date:   04/18/2021                   - call office if I gain more than 2 pounds in one day or 5 pounds in one week - keep legs up while sitting - track weight in diary - use salt in moderation - watch for swelling in feet, ankles and legs every day - weigh myself daily    Why is this important?   It is important to check your weight daily and watch how much salt and liquids you have.  It will help you to manage your heart failure.   Barriers: Health Behaviors  Notes: 7/28: Pt denies any swelling with no acute issues. No acute issues reported from her LLE. Pt remains in the  GREEN zone with no problems. Weight remains steady with no fluid retention. 6/10-Pt confirm her weights yesterday and today at 124. 4 lbs and last week 120 lbs with no residual symptoms. Denies any swelling to her LE and pt remains in the GREEN zone with no acute symptoms.  4/21-Pt continue to weigh daily with some swelling to her LLE however minimal. Pt aware to elevate her LE to reduce her ongoing swelling (history). Will continue to encourage  adherence. 3/25-Pt reports some swelling over the last month with increase swelling to her LE 2.5 lbs overnight. Pt reports she will try to obtain a light version at a medical supply story. Also recommended a nursing supply store that maybe less expensive or [facility] in [place] another resources. Pt aware to elevate throughout the day to prevent further swelling.  2/25-Reports some fluid retention where her provided added additional medications for a few days until back at baseline. No additional symptoms as pt remains on track at this time. Will continue to encouraged ongoing management of care. 09/12/2020- Pt reports weights as followed: Today 128.8 lbs, Yesterday 129.6 lbs and last week at 131.4 lbs. Pt remains in the GREEN zone and remains symptoms free with no acute problems. Discussed baseline weights and ongoing monitoring system for prevention.  08/02/2020-Today weight 129.4 lbs, Yesterday 127.8 lbs and last week 127.2 lbs and pt remains asymptomatic with no swelling.      THN-Track and Manage Symptoms   On track    Follow Up Date  04/13/2021  Timeframe:  Long-Range Goal Priority:  Medium Start Date:    08/02/2020  Expected End Date:   06/18/2021                  - begin a heart failure diary - bring diary to all appointments - follow rescue plan if symptoms flare-up - know when to call the doctor - track symptoms and what helps feel better or worse    Why is this important?   You will be able to handle your symptoms better if you keep track of them.  Making some simple changes to your lifestyle will help.  Eating healthy is one thing you can do to take good care of yourself.   Barriers: Health Behaviors  Notes:  7/28-No acute symptoms remains in the GREEN zone. Pt continues to report steady weights with no fluid retention.  4/21: Pt continues to do well in the GREEN zone however small amount of swelling to her LLE however minimal and she is aware to  elevate this extremity. Reports today's weight 124 lbs, yesterday 125 lbs and last week 129 lbs since that time pt took extra fluid pill to reduce over time.   2/25-Pt verifies she remains in the GREEN zone at this time however episode with 3 lbs where her MD intervened with added medications until she returned to her baseline weight. This AM 127 lbs and yesterday 128.4 lbs with no additional symptoms.   09/12/2020-Verified pt remains in the GREEN zone with no acute symptoms. Will continue to offer [facility] calendar for ongoing documentation of all weights and/or symptoms.  Discuss the sones and what to do if acute symptoms occur.Will also alert pt on when to contact her provider with symptoms.   08/02/2020-Review the above symptoms and what to do by contacting her provider. Will verify pt remains in the GREEN zone with no acute symptoms.         Raina Mina, RN Care Management Coordinator [place] [facility] [phone]

## 2021-03-26 ENCOUNTER — Telehealth: Payer: Self-pay | Admitting: Cardiovascular Disease

## 2021-03-26 NOTE — Telephone Encounter (Incomplete)
Pt c/o BP issue: STAT if pt c/o blurred vision, one-sided weakness or slurred speech  1. What are your last 5 BP readings? Last nigth 87/47; this morning 120/74  2. Are you having any other symptoms (ex. Dizziness, headache, blurred vision, passed out)? No energy   3. What is your BP issue?  Is too low. Patient stated that dr Marisue Ivan took her off a couple of peoples that she think she should still be on

## 2021-03-27 ENCOUNTER — Other Ambulatory Visit: Payer: Self-pay | Admitting: Family

## 2021-03-27 ENCOUNTER — Ambulatory Visit (INDEPENDENT_AMBULATORY_CARE_PROVIDER_SITE_OTHER): Payer: Medicare Other | Admitting: Family

## 2021-03-27 ENCOUNTER — Other Ambulatory Visit: Payer: Self-pay

## 2021-03-27 ENCOUNTER — Ambulatory Visit
Admission: RE | Admit: 2021-03-27 | Discharge: 2021-03-27 | Disposition: A | Discharge status: Home / Self Care | Payer: Medicare Other | Source: Ambulatory Visit | Attending: Family | Admitting: Family

## 2021-03-27 ENCOUNTER — Encounter: Payer: Self-pay | Admitting: Family

## 2021-03-27 ENCOUNTER — Other Ambulatory Visit: Payer: Self-pay | Admitting: Nurse Practitioner

## 2021-03-27 VITALS — BP 120/80 | HR 72 | Temp 97.7°F | Resp 16 | Ht 60.0 in | Wt 125.4 lb

## 2021-03-27 DIAGNOSIS — M810 Age-related osteoporosis without current pathological fracture: Secondary | ICD-10-CM | POA: Diagnosis not present

## 2021-03-27 DIAGNOSIS — I10 Essential (primary) hypertension: Secondary | ICD-10-CM

## 2021-03-27 DIAGNOSIS — I482 Chronic atrial fibrillation, unspecified: Secondary | ICD-10-CM

## 2021-03-27 DIAGNOSIS — R0781 Pleurodynia: Secondary | ICD-10-CM

## 2021-03-27 DIAGNOSIS — I700 Atherosclerosis of aorta: Secondary | ICD-10-CM | POA: Diagnosis not present

## 2021-03-27 DIAGNOSIS — K219 Gastro-esophageal reflux disease without esophagitis: Secondary | ICD-10-CM

## 2021-03-27 MED ORDER — ACETAMINOPHEN 500 MG PO TABS: 500.0000 mg | ORAL_TABLET | Freq: Three times a day (TID) | ORAL | 0 refills | Status: DC | PRN

## 2021-03-27 NOTE — Progress Notes (Signed)
Provider: Brookie Wayment FNP-C  [name], NP  Patient Care Team: [name], NP as PCP - General (Adult Health Nurse Practitioner) [name], MD as PCP - Cardiology (Cardiology) [name], RN as [facility] [employer]  Extended Emergency Contact Information Primary Emergency Contact: Wilfred Lacy [place] Phone: [phone] Work Phone: [phone] Relation: Son Secondary Emergency Contact: Katz,Mitzi Address: [place], [place] Phone: [phone] Relation: Daughter  Code Status:  Full Code  Goals of care: Advanced Directive information Advanced Directives 03/27/2021  Does Patient Have a Medical Advance Directive? No  Type of Advance Directive -  Does patient want to make changes to medical advance directive? -  Copy of [place] in Chart? -  Would patient like information on creating a medical advance directive? No - Patient declined     Chief Complaint  Patient presents with   Acute Visit    Patient had a fall last night and complains of right side pain under rib area.   Concern    High Fall Risk    HPI:  Pt is a 87 y.o. female seen today for an acute visit for evaluation of right side pain under rib area.States was getting up up from chair.fell hitting arm of the chair on right side.does not recall hitting head or any loss of consciousness.she was able to get up by herself.usually walks with a cane. Took a tylenol which eased the pain but feels like the pain is deep under the rib.painful with breathing.  She denies any cough,wheezing or shortness of breath.Also denies any fever,chills or symptoms of UTI.    Past Medical History:  Diagnosis Date   Bladder prolapse, female, acquired    Cancer [facility])    thyroid   Chest pain    no known ischemic heart disease; negative Myoview July 2013. EF 75% with no ischemia.    Chronic  anticoagulation    Chronic atrial fibrillation (HCC)    managed with rate control and coumadin   Chronic diastolic heart failure (HCC)    GERD (gastroesophageal reflux disease)    Heart disease    History of congenital mitral regurgitation    mild   History of heart attack    Multiple    History of mitral valve prolapse 06/15/2008   a. echo 1/14: mild LVH, EF 60-65%, mild MR, mild to mod BAE, PASP 35   Hypercholesterolemia    Hypertension    Hypothyroidism    Past Surgical History:  Procedure Laterality Date   ABDOMINAL HYSTERECTOMY     CATARACT EXTRACTION     CHOLECYSTECTOMY     Thyroidectomy     TONSILLECTOMY      Allergies  Allergen Reactions   Azithromycin Nausea Only and Other (See Comments)   Compazine [Prochlorperazine Edisylate] Nausea Only   Demerol Nausea Only   Dolophine [Methadone] Nausea Only   Ebastine Nausea Only    EBS   Erythromycin Nausea Only and Other (See Comments)   Ibuprofen Nausea Only and Other (See Comments)   Meperidine Hcl Other (See Comments)   Morphine Sulfate Other (See Comments)   Statins Other (See Comments)    myalgias   Tetanus Toxoids Nausea Only   Tetracycline Other (See Comments)   Tetracyclines & Related Nausea Only    Outpatient Encounter Medications as of 03/27/2021  Medication Sig   amLODipine (NORVASC)  2.5 MG tablet Take 5 mg by mouth at bedtime.   atenolol (TENORMIN) 50 MG tablet TAKE 1 TABLET BY MOUTH  TWICE DAILY   benzonatate (TESSALON) 100 MG capsule Take 1 capsule (100 mg total) by mouth every 8 (eight) hours as needed for cough.   Calcium Carbonate-Vitamin D (CALCIUM 600+D PO) Take 1 tablet by mouth 2 (two) times daily.   Cholecalciferol (VITAMIN D) 2000 UNITS tablet Take 4,000 Units by mouth daily.   dorzolamide (TRUSOPT) 2 % ophthalmic solution Place 1 drop into the left eye 2 (two) times daily.   ELIQUIS 2.5 MG TABS tablet TAKE 1 TABLET BY MOUTH  TWICE DAILY   furosemide (LASIX) 40 MG tablet Take 1 tablet (40 mg  total) by mouth 2 (two) times daily. May take an additional 40 mg as needed for swelling or weight gain   isosorbide mononitrate (IMDUR) 30 MG 24 hr tablet TAKE 1 TABLET BY MOUTH  DAILY   latanoprost (XALATAN) 0.005 % ophthalmic solution Place 1 drop into both eyes at bedtime.    levothyroxine (SYNTHROID, LEVOTHROID) 100 MCG tablet Take 100 mcg by mouth daily.    Loperamide HCl (IMODIUM PO) Take 1 capsule by mouth 2 (two) times daily.   MAGNESIUM OXIDE 400 PO Take 800 mg by mouth daily.   Multiple Vitamins-Minerals (CENTRUM SILVER PO) Take 1 tablet by mouth daily.   Multiple Vitamins-Minerals (PRESERVISION AREDS 2 PO) Take 1 capsule by mouth 2 (two) times daily.    nitroGLYCERIN (NITROSTAT) 0.4 MG SL tablet DISSOLVE 1 TABLET UNDER THE TONGUE EVERY 5 MINUTES FOR 3 DOSES AS NEEDED  FOR  CHEST  PAIN   omeprazole (PRILOSEC) 40 MG capsule TAKE 1 CAPSULE BY MOUTH  DAILY   potassium chloride (KLOR-CON) 10 MEQ tablet Take 10 mEq by mouth daily.   pramipexole (MIRAPEX) 0.125 MG tablet TAKE 1 TABLET BY MOUTH IN  THE EVENING   PREMARIN vaginal cream Place 2 g vaginally once a week. Uses on Sunday and wednesday   Probiotic Product (PROBIOTIC DAILY PO) Take 1 tablet by mouth daily.   sodium chloride (MURO 128) 5 % ophthalmic solution Place 1 drop into the right eye at bedtime.    temazepam (RESTORIL) 15 MG capsule Take 1 capsule (15 mg total) by mouth at bedtime.   No facility-administered encounter medications on file as of 03/27/2021.    Review of Systems  Constitutional:  Negative for appetite change, chills, fatigue, fever and unexpected weight change.  HENT:  Negative for congestion, dental problem, ear discharge, ear pain, facial swelling, hearing loss, nosebleeds, postnasal drip, rhinorrhea, sinus pressure, sinus pain, sneezing, sore throat, tinnitus and trouble swallowing.   Eyes:  Negative for pain, discharge, redness, itching and visual disturbance.  Respiratory:  Negative for cough, chest  tightness, shortness of breath and wheezing.        Right side pain post fall per HPI   Cardiovascular:  Negative for chest pain, palpitations and leg swelling.  Gastrointestinal:  Negative for abdominal distention, abdominal pain, blood in stool, constipation, diarrhea, nausea and vomiting.  Genitourinary:  Negative for difficulty urinating, dysuria, flank pain, frequency and urgency.  Musculoskeletal:  Positive for gait problem. Negative for arthralgias, back pain, joint swelling, myalgias, neck pain and neck stiffness.  Skin:  Negative for color change, pallor, rash and wound.  Neurological:  Negative for dizziness, syncope, speech difficulty, weakness, light-headedness, numbness and headaches.  Hematological:  Does not bruise/bleed easily.  Psychiatric/Behavioral:  Negative for agitation, behavioral problems, confusion, hallucinations, self-injury,  sleep disturbance and suicidal ideas. The patient is not nervous/anxious.    Immunization History  Administered Date(s) Administered   Influenza Split 04/28/2009, 06/07/2010, 04/30/2012, 06/08/2013   Influenza, High Dose Seasonal PF 04/21/2014, 04/17/2016, 06/18/2017, 06/19/2018, 04/16/2019, 07/03/2020   Influenza,inj,Quad PF,6+ Mos 05/20/2011   Influenza,inj,quad, With Preservative 04/13/2015   Moderna Sars-Covid-2 Vaccination 10/01/2019, 10/29/2019, 06/29/2020   Pneumococcal Conjugate-13 10/01/2013   Pneumococcal Polysaccharide-23 04/12/2010, 06/18/2017   Tdap 05/20/2011   Zoster, Live 06/04/2006   Pertinent  Health Maintenance Due  Topic Date Due   DEXA SCAN  Never done   INFLUENZA VACCINE  03/19/2021   PNA vac Low Risk Adult  Completed   Fall Risk  03/27/2021 11/23/2020 10/20/2020 01/06/2020 11/12/2019  Falls in the past year? '1 1 1 1 1  '$ Number falls in past yr: 1 0 1 0 0  Comment - - - - fall last Fall 2020  Injury with Fall? 1 0 0 0 0  Comment - - - - -  Risk for fall due to : History of fall(s) - - Impaired balance/gait History of  fall(s);Impaired balance/gait;Impaired mobility;Impaired vision;Medication side effect  Follow up Falls evaluation completed - - Education provided;Falls prevention discussed Falls evaluation completed;Education provided;Falls prevention discussed   Functional Status Survey:    Vitals:   03/27/21 1452  BP: 120/80  Pulse: 72  Resp: 16  Temp: 97.7 F (36.5 C)  SpO2: 93%  Weight: 125 lb 6.4 oz (56.9 kg)  Height: 5' (1.524 m)   Body mass index is 24.49 kg/m. Physical Exam Vitals reviewed.  Constitutional:      General: She is not in acute distress.    Appearance: Normal appearance. She is normal weight. She is not ill-appearing or diaphoretic.  HENT:     Head: Normocephalic.     Nose: Nose normal. No congestion or rhinorrhea.     Mouth/Throat:     Mouth: Mucous membranes are moist.     Pharynx: Oropharynx is clear. No oropharyngeal exudate or posterior oropharyngeal erythema.  Eyes:     General: No scleral icterus.       Right eye: No discharge.        Left eye: No discharge.     Conjunctiva/sclera: Conjunctivae normal.     Pupils: Pupils are equal, round, and reactive to light.  Cardiovascular:     Rate and Rhythm: Normal rate and regular rhythm.     Pulses: Normal pulses.     Heart sounds: Normal heart sounds. No murmur heard.   No friction rub. No gallop.  Pulmonary:     Effort: Pulmonary effort is normal. No respiratory distress.     Breath sounds: Normal breath sounds. No wheezing, rhonchi or rales.     Comments: Right rib cage side tender to palpation.No erythema,bruise or mass noted  Chest:     Chest wall: No tenderness.  Abdominal:     General: Bowel sounds are normal. There is no distension.     Palpations: Abdomen is soft. There is no mass.     Tenderness: There is no abdominal tenderness. There is no right CVA tenderness, left CVA tenderness, guarding or rebound.  Musculoskeletal:        General: No swelling or tenderness. Normal range of motion.     Right  lower leg: No edema.     Left lower leg: No edema.  Skin:    General: Skin is warm and dry.     Coloration: Skin is not pale.  Findings: No bruising, erythema, lesion or rash.  Neurological:     Mental Status: She is alert and oriented to person, place, and time.     Cranial Nerves: No cranial nerve deficit.     Sensory: No sensory deficit.     Motor: No weakness.     Coordination: Coordination normal.     Gait: Gait abnormal.     Comments: Ambulates with a cane   Psychiatric:        Mood and Affect: Mood normal.        Speech: Speech normal.        Behavior: Behavior normal.        Thought Content: Thought content normal.        Judgment: Judgment normal.    Labs reviewed: Recent Labs    11/23/20 1417  NA 138  K 4.1  CL 100  CO2 28  GLUCOSE 92  BUN 12  CREATININE 0.77  CALCIUM 8.5*   No results for input(s): AST, ALT, ALKPHOS, BILITOT, PROT, ALBUMIN in the last 8760 hours. Recent Labs    11/23/20 1417  WBC 5.7  NEUTROABS 2,457  HGB 15.3  HCT 46.4*  MCV 93.5  PLT 246   Lab Results  Component Value Date   TSH 4.393 11/11/2014   No results found for: HGBA1C Lab Results  Component Value Date   CHOL 211 (H) 11/04/2012   HDL 66.80 11/04/2012   LDLCALC 109 (H) 04/16/2012   LDLDIRECT 98.8 11/04/2012   TRIG 201.0 (H) 11/04/2012   CHOLHDL 3 11/04/2012    Significant Diagnostic Results in last 30 days:  No results found.  Assessment/Plan  Rib pain on right side Afebrile Right Rib cage side tender to palpation.No mass,erythema or bruise noted - Advised to take Tylenol as below. - will obtain right rib cage imaging. Advised to get X-ray at [place] at [facility] then will call you with results.States a neighbor friend brought her today will take her to imaging place familiar with area.  - Dg rib Unilateral right  - acetaminophen (TYLENOL) 500 MG tablet; Take 1 tablet (500 mg total) by mouth every 8 (eight) hours as needed for  moderate pain.  Dispense: 30 tablet; Refill: 0 - Advised to notify provider or go to ED if symptoms worsen or fail to improve    Family/ staff Communication: Reviewed plan of care with patient  Labs/tests ordered:- Dg rib Unilateral right   Next Appointment: As needed if symptoms worsen or fail to improve    Sandrea Hughs, NP

## 2021-03-27 NOTE — Progress Notes (Signed)
Lab orders placed for 04/09/21.

## 2021-03-27 NOTE — Patient Instructions (Signed)
-   Please get X-ray at [place] at [facility] then will call you with results.  - Take Extra strength 500 mg tablet one by mouth every 8 hrs as needed for pain

## 2021-03-29 ENCOUNTER — Ambulatory Visit: Payer: Medicare Other | Admitting: Orthopedic Surgery

## 2021-03-29 ENCOUNTER — Telehealth: Payer: Self-pay

## 2021-03-29 NOTE — Telephone Encounter (Signed)
Kristen Whitehead [phone] fell Monday night. She was seen. Xrays done, but still in pain. According to the patient, the nurse from her insurance says there diclofenac prescription she could get sent to [facility]. She is currently taking two Tylenol TID but not helping. To [employer]

## 2021-03-29 NOTE — Telephone Encounter (Signed)
Start on Diclofenac 75 mg tablet one by mouth daily

## 2021-03-30 MED ORDER — DICLOFENAC SODIUM 75 MG PO TBEC: 75.0000 mg | DELAYED_RELEASE_TABLET | Freq: Every day | ORAL | 0 refills | Status: DC

## 2021-03-30 NOTE — Telephone Encounter (Signed)
Called and discussed with the patient. RX canceled. No further questions.

## 2021-03-30 NOTE — Telephone Encounter (Signed)
Diclofenac called in. Called and discussed with the patient. She states she is in more pain and concerned because she wasn't examined (x-rays only) and now her waist and back area is very sore. As long as she is lying down she is fine. Patient is currently on Aspirin for the pain (she also tae Eliquis). To [employer]

## 2021-03-30 NOTE — Telephone Encounter (Signed)
Discontinue diclofenac tablet  - apply over the counter Salon pas to painful area or Biofreeze gel three times daily as needed for pain

## 2021-03-30 NOTE — Telephone Encounter (Signed)
Exam was performed during visit no bruise was noted.she was tender on right side of rib cage with palpation that's why X-ray was ordered.Recommend ED evaluation if pain has worsen.

## 2021-03-30 NOTE — Telephone Encounter (Signed)
Clarified with the patient because kept saying both names. Acetaminophen NOT aspirin is what she is taking. She states she guess she just didn't remember but she is glad she was examined. She states the Tylenol helps her and would prefer a topical instead of the diclofenac tablets. She would prefer not to add any pills. To Dinah, I can cancel tablets, if okay.

## 2021-04-06 ENCOUNTER — Other Ambulatory Visit: Payer: Self-pay | Admitting: Cardiovascular Disease

## 2021-04-09 ENCOUNTER — Other Ambulatory Visit: Payer: Self-pay | Admitting: Orthopedic Surgery

## 2021-04-09 ENCOUNTER — Other Ambulatory Visit: Payer: Medicare Other

## 2021-04-09 NOTE — Telephone Encounter (Signed)
Message from 03/26/21 that was never routed concerning pt BP--routed to [facility] Triage and Almyra Free, LPN. No record in chart that it was responded to. Will wait on response to message before refilling meds d/t possibility of any changes.

## 2021-04-09 NOTE — Telephone Encounter (Signed)
Also routed to Dr. Oval Linsey and [name], LPN

## 2021-04-10 NOTE — Telephone Encounter (Signed)
No contract on file. Medication pended and sent to [name], NP for approval

## 2021-04-10 NOTE — Telephone Encounter (Addendum)
Kristen Whitehead A   SS   1:26 PM Incomplete Note Pt c/o BP issue: STAT if pt c/o blurred vision, one-sided weakness or slurred speech   1. What are your last 5 BP readings? Last nigth 87/47; this morning 120/74   2. Are you having any other symptoms (ex. Dizziness, headache, blurred vision, passed out)? No energy    3. What is your BP issue?  Is too low. Patient stated that dr Marisue Ivan took her off a couple of peoples that she think she should still be on     Message never routed   Called patient to follow up on this Stated her blood pressure this week has been more elevated, no SBP below 100  She will continue to monitor and call back in a couple week with updated blood pressure readings unless low and will call back sooner

## 2021-04-12 ENCOUNTER — Other Ambulatory Visit: Payer: Self-pay

## 2021-04-12 ENCOUNTER — Encounter: Payer: Self-pay | Admitting: Orthopedic Surgery

## 2021-04-12 ENCOUNTER — Ambulatory Visit (INDEPENDENT_AMBULATORY_CARE_PROVIDER_SITE_OTHER): Payer: Medicare Other | Admitting: Orthopedic Surgery

## 2021-04-12 VITALS — BP 116/74 | HR 62 | Temp 96.9°F | Ht 60.0 in | Wt 123.8 lb

## 2021-04-12 DIAGNOSIS — G4709 Other insomnia: Secondary | ICD-10-CM | POA: Diagnosis not present

## 2021-04-12 DIAGNOSIS — K219 Gastro-esophageal reflux disease without esophagitis: Secondary | ICD-10-CM | POA: Diagnosis not present

## 2021-04-12 DIAGNOSIS — R0781 Pleurodynia: Secondary | ICD-10-CM | POA: Diagnosis not present

## 2021-04-12 DIAGNOSIS — R2681 Unsteadiness on feet: Secondary | ICD-10-CM

## 2021-04-12 DIAGNOSIS — I10 Essential (primary) hypertension: Secondary | ICD-10-CM

## 2021-04-12 DIAGNOSIS — R531 Weakness: Secondary | ICD-10-CM | POA: Diagnosis not present

## 2021-04-12 DIAGNOSIS — M8580 Other specified disorders of bone density and structure, unspecified site: Secondary | ICD-10-CM

## 2021-04-12 DIAGNOSIS — I482 Chronic atrial fibrillation, unspecified: Secondary | ICD-10-CM

## 2021-04-12 DIAGNOSIS — E890 Postprocedural hypothyroidism: Secondary | ICD-10-CM | POA: Diagnosis not present

## 2021-04-12 NOTE — Patient Instructions (Addendum)
Think about getting another bone density study- please call office if you are interested.   Please get flu vaccine before October 31st.   Eliquis - co-pay card- look up online  Please have eye doctor send note to [facility]

## 2021-04-12 NOTE — Progress Notes (Signed)
Careteam: Patient Care Team: [name], NP as PCP - General (Adult Health Nurse Practitioner) [name], MD as PCP - Cardiology (Cardiology) [name], RN as [place] Management  Seen by: [name], AGNP-C  PLACE OF SERVICE:  [place]  Advanced Directive information    Allergies  Allergen Reactions   Azithromycin Nausea Only and Other (See Comments)   Compazine [Prochlorperazine Edisylate] Nausea Only   Demerol Nausea Only   Dolophine [Methadone] Nausea Only   Ebastine Nausea Only    EBS   Erythromycin Nausea Only and Other (See Comments)   Ibuprofen Nausea Only and Other (See Comments)   Meperidine Hcl Other (See Comments)   Morphine Sulfate Other (See Comments)   Statins Other (See Comments)    myalgias   Tetanus Toxoids Nausea Only   Tetracycline Other (See Comments)   Tetracyclines & Related Nausea Only    No chief complaint on file.    HPI: Patient is a 87 y.o. female seen today for medical management of chronic conditions.   Right sided pain- 08/09 she fell getting up and hit right side. Xray right chest negative for acute fracture. She is not taking anything for pain at this time. Reports some soreness to right side at this time.   HTN- she brought her blood pressure log with her today. She spoke with cardiology recently and was advised to stop irbesartan. Her blood pressures have improved since stopping medication. She was also advised to take amlodipine at night due to increased blood pressures. She reports morning bp < 130/80 and evening bp < 150/90.  Continues to follow low sodium diet. Denies chest pain, blurred vision, and headaches.   CHF- denies recent sob, weight fluctuations or ankle edema. Continues to take imdur and atenolol. Continues to follow low sodium diet and weight herself daily.   Omeprazole- stopped taking medication, denies reflux at this time.   Insomnia- reports getting about 6 hours of sleep  when taking temazepam. Also naps during the afternoon.   Appetite- reports weight has fluctuated between 120-130 lbs. Today's weight 123 lbs. She states she eats 3 meals a day but it may not include a lot of food. Only eats when hungry. She also reports progressive weakness. Discussed starting ensure to increase calories and energy.   Falls/ ambulation- fall last month while using steps, she fell backwards and did not injure herself. Uses cane when going out. Wears a life alert while at home.   Dexa scan- she has been told she had osteopenia in the past. Reports last DEXA a few years ago. She would like to think about getting bone density test at this time. Advised to call PCP if she would like to schedule test.   Covid booster-  second moderna booster received 01/18/2021.   Flu vaccine- plans to get soon  Shingles vaccine- discussed benefits of getting vaccine, she would like to think about it at this time.   Appointment with Dr. [name] next week- reports trouble reading lately.    Review of Systems:  Review of Systems  Constitutional:  Negative for chills, fever, malaise/fatigue and weight loss.  HENT: Negative.    Eyes: Negative.   Respiratory:  Positive for cough. Negative for shortness of breath and wheezing.        Dry cough in morning  Cardiovascular:  Negative for chest pain and leg swelling.  Gastrointestinal:  Negative for abdominal pain, blood in stool, constipation, diarrhea, heartburn, nausea and  vomiting.  Genitourinary:  Negative for dysuria, frequency and hematuria.  Musculoskeletal:  Positive for falls. Negative for joint pain and myalgias.  Skin: Negative.   Neurological:  Positive for weakness. Negative for dizziness and headaches.  Psychiatric/Behavioral:  Negative for depression. The patient has insomnia. The patient is not nervous/anxious.    Past Medical History:  Diagnosis Date   Bladder prolapse, female, acquired    Cancer [facility])    thyroid   Chest pain     no known ischemic heart disease; negative Myoview July 2013. EF 75% with no ischemia.    Chronic anticoagulation    Chronic atrial fibrillation (HCC)    managed with rate control and coumadin   Chronic diastolic heart failure (HCC)    GERD (gastroesophageal reflux disease)    Heart disease    History of congenital mitral regurgitation    mild   History of heart attack    Multiple    History of mitral valve prolapse 06/15/2008   a. echo 1/14: mild LVH, EF 60-65%, mild MR, mild to mod BAE, PASP 35   Hypercholesterolemia    Hypertension    Hypothyroidism    Past Surgical History:  Procedure Laterality Date   ABDOMINAL HYSTERECTOMY     CATARACT EXTRACTION     CHOLECYSTECTOMY     Thyroidectomy     TONSILLECTOMY     Social History:   reports that she has never smoked. She has never used smokeless tobacco. She reports that she does not drink alcohol and does not use drugs.  Family History  Problem Relation Age of Onset   Stroke Mother 54   Stroke Father 68   Heart attack Neg Hx    Heart disease Neg Hx     Medications: Patient's Medications  New Prescriptions   No medications on file  Previous Medications   ACETAMINOPHEN (TYLENOL) 500 MG TABLET    Take 1 tablet (500 mg total) by mouth every 8 (eight) hours as needed for moderate pain.   AMLODIPINE (NORVASC) 2.5 MG TABLET    TAKE 2 TABLETS BY MOUTH  DAILY   ATENOLOL (TENORMIN) 50 MG TABLET    TAKE 1 TABLET BY MOUTH  TWICE DAILY   BENZONATATE (TESSALON) 100 MG CAPSULE    Take 1 capsule (100 mg total) by mouth every 8 (eight) hours as needed for cough.   CALCIUM CARBONATE-VITAMIN D (CALCIUM 600+D PO)    Take 1 tablet by mouth 2 (two) times daily.   CHOLECALCIFEROL (VITAMIN D) 2000 UNITS TABLET    Take 4,000 Units by mouth daily.   DORZOLAMIDE (TRUSOPT) 2 % OPHTHALMIC SOLUTION    Place 1 drop into the left eye 2 (two) times daily.   ELIQUIS 2.5 MG TABS TABLET    TAKE 1 TABLET BY MOUTH  TWICE DAILY   FUROSEMIDE (LASIX) 40 MG  TABLET    Take 1 tablet (40 mg total) by mouth 2 (two) times daily. May take an additional 40 mg as needed for swelling or weight gain   ISOSORBIDE MONONITRATE (IMDUR) 30 MG 24 HR TABLET    TAKE 1 TABLET BY MOUTH  DAILY   LATANOPROST (XALATAN) 0.005 % OPHTHALMIC SOLUTION    Place 1 drop into both eyes at bedtime.    LEVOTHYROXINE (SYNTHROID, LEVOTHROID) 100 MCG TABLET    Take 100 mcg by mouth daily.    LOPERAMIDE HCL (IMODIUM PO)    Take 1 capsule by mouth 2 (two) times daily.   MAGNESIUM OXIDE 400 PO  Take 800 mg by mouth daily.   MULTIPLE VITAMINS-MINERALS (CENTRUM SILVER PO)    Take 1 tablet by mouth daily.   MULTIPLE VITAMINS-MINERALS (PRESERVISION AREDS 2 PO)    Take 1 capsule by mouth 2 (two) times daily.    NITROGLYCERIN (NITROSTAT) 0.4 MG SL TABLET    DISSOLVE 1 TABLET UNDER THE TONGUE EVERY 5 MINUTES FOR 3 DOSES AS NEEDED  FOR  CHEST  PAIN   OMEPRAZOLE (PRILOSEC) 40 MG CAPSULE    TAKE 1 CAPSULE BY MOUTH  DAILY   POTASSIUM CHLORIDE (KLOR-CON) 10 MEQ TABLET    Take 10 mEq by mouth daily.   POTASSIUM CHLORIDE (KLOR-CON) 10 MEQ TABLET    TAKE 1 TABLET BY MOUTH  DAILY   PRAMIPEXOLE (MIRAPEX) 0.125 MG TABLET    TAKE 1 TABLET BY MOUTH IN  THE EVENING   PREMARIN VAGINAL CREAM    Place 2 g vaginally once a week. Uses on Sunday and wednesday   PROBIOTIC PRODUCT (PROBIOTIC DAILY PO)    Take 1 tablet by mouth daily.   SODIUM CHLORIDE (MURO 128) 5 % OPHTHALMIC SOLUTION    Place 1 drop into the right eye at bedtime.    TEMAZEPAM (RESTORIL) 15 MG CAPSULE    TAKE 1 CAPSULE(15 MG TOTAL) BY MOUTH AT BEDTIME.  Modified Medications   No medications on file  Discontinued Medications   No medications on file    Physical Exam:  There were no vitals filed for this visit. There is no height or weight on file to calculate BMI. Wt Readings from Last 3 Encounters:  03/27/21 125 lb 6.4 oz (56.9 kg)  02/15/21 129 lb (58.5 kg)  12/19/20 128 lb (58.1 kg)    Physical Exam Vitals reviewed.   Constitutional:      General: She is not in acute distress. HENT:     Head: Normocephalic.  Eyes:     General:        Right eye: No discharge.        Left eye: No discharge.  Neck:     Vascular: No carotid bruit.  Cardiovascular:     Rate and Rhythm: Normal rate and regular rhythm.     Pulses: Normal pulses.     Heart sounds: Normal heart sounds. No murmur heard. Pulmonary:     Effort: Pulmonary effort is normal. No respiratory distress.     Breath sounds: Normal breath sounds. No wheezing.  Abdominal:     General: Bowel sounds are normal. There is no distension.     Palpations: Abdomen is soft.     Tenderness: There is no abdominal tenderness.  Musculoskeletal:     Cervical back: Normal range of motion.     Right lower leg: No edema.     Left lower leg: No edema.  Lymphadenopathy:     Cervical: No cervical adenopathy.  Skin:    General: Skin is warm and dry.     Capillary Refill: Capillary refill takes less than 2 seconds.  Neurological:     General: No focal deficit present.     Mental Status: She is alert and oriented to person, place, and time.     Motor: Weakness present.     Gait: Gait abnormal.     Comments: Cane  Psychiatric:        Mood and Affect: Mood normal.        Behavior: Behavior normal.    Labs reviewed: Basic Metabolic Panel: Recent Labs    11/23/20 1417  NA 138  K 4.1  CL 100  CO2 28  GLUCOSE 92  BUN 12  CREATININE 0.77  CALCIUM 8.5*   Liver Function Tests: No results for input(s): AST, ALT, ALKPHOS, BILITOT, PROT, ALBUMIN in the last 8760 hours. No results for input(s): LIPASE, AMYLASE in the last 8760 hours. No results for input(s): AMMONIA in the last 8760 hours. CBC: Recent Labs    11/23/20 1417  WBC 5.7  NEUTROABS 2,457  HGB 15.3  HCT 46.4*  MCV 93.5  PLT 246   Lipid Panel: No results for input(s): CHOL, HDL, LDLCALC, TRIG, CHOLHDL, LDLDIRECT in the last 8760 hours. TSH: No results for input(s): TSH in the last 8760  hours. A1C: No results found for: HGBA1C   Assessment/Plan 1. Other insomnia - sleeping well about 6hrs a night - controlled substance contract signed - cont temazepam 15 mg q hs  2. Essential hypertension - improved, no hypotension at this this - irbesartan discontinued last month - cont amlodipine 2.5 mg qhs - cont low sodium diet - CBC with Differential/Platelet - CMP  3. Postoperative hypothyroidism - stable - cont levothyroxine 100 mcg daily - TSH  4. Chronic atrial fibrillation (HCC) - rate controlled with with atenolol - cont eliquis for clot prevention  5. Gastroesophageal reflux disease without esophagitis - stopped taking omeprazole  - denies reflux  6. Rib pain on right side - improving - xray negative for acute fracture  7. Gait instability - 2 falls in the past 2 month - cont using cane - cont life alert - not interested in PT at this time  8. Osteopenia, unspecified location - bone density test discussed, she would like to think about it - reports test done a few years ago - cont calcium and vitamin d daily  9. Weakness - reports eating 3 meals a day, but small amounts - she reports feeling weaker, suspect she is lacking calories - discussed drinking one ensure daily to increase energy  Total time: 42 minutes. Greater than 50 % of total time spent doing patient education on health maintenance, medication management and diet.   Next appt: 08/16/2021  [name], [facility] [phone]

## 2021-04-13 ENCOUNTER — Other Ambulatory Visit: Payer: Self-pay | Admitting: *Deleted

## 2021-04-13 LAB — COMPREHENSIVE METABOLIC PANEL
AG Ratio: 1.4 (calc) (ref 1.0–2.5)
ALT: 12 U/L (ref 6–29)
AST: 19 U/L (ref 10–35)
Albumin: 3.8 g/dL (ref 3.6–5.1)
Alkaline phosphatase (APISO): 123 U/L (ref 37–153)
BUN: 10 mg/dL (ref 7–25)
CO2: 31 mmol/L (ref 20–32)
Calcium: 8.5 mg/dL — ABNORMAL LOW (ref 8.6–10.4)
Chloride: 96 mmol/L — ABNORMAL LOW (ref 98–110)
Creat: 0.72 mg/dL (ref 0.60–0.95)
Globulin: 2.8 g/dL (calc) (ref 1.9–3.7)
Glucose, Bld: 83 mg/dL (ref 65–139)
Potassium: 4 mmol/L (ref 3.5–5.3)
Sodium: 135 mmol/L (ref 135–146)
Total Bilirubin: 0.8 mg/dL (ref 0.2–1.2)
Total Protein: 6.6 g/dL (ref 6.1–8.1)

## 2021-04-13 LAB — CBC WITH DIFFERENTIAL/PLATELET
Absolute Monocytes: 619 cells/uL (ref 200–950)
Basophils Absolute: 31 cells/uL (ref 0–200)
Basophils Relative: 0.6 %
Eosinophils Absolute: 338 cells/uL (ref 15–500)
Eosinophils Relative: 6.5 %
HCT: 42.1 % (ref 35.0–45.0)
Hemoglobin: 14.1 g/dL (ref 11.7–15.5)
Lymphs Abs: 1966 cells/uL (ref 850–3900)
MCH: 32.2 pg (ref 27.0–33.0)
MCHC: 33.5 g/dL (ref 32.0–36.0)
MCV: 96.1 fL (ref 80.0–100.0)
MPV: 9.6 fL (ref 7.5–12.5)
Monocytes Relative: 11.9 %
Neutro Abs: 2246 cells/uL (ref 1500–7800)
Neutrophils Relative %: 43.2 %
Platelets: 268 10*3/uL (ref 140–400)
RBC: 4.38 10*6/uL (ref 3.80–5.10)
RDW: 13 % (ref 11.0–15.0)
Total Lymphocyte: 37.8 %
WBC: 5.2 10*3/uL (ref 3.8–10.8)

## 2021-04-13 LAB — TSH: TSH: 0.41 mIU/L (ref 0.40–4.50)

## 2021-04-13 NOTE — Patient Outreach (Signed)
[facility]) Care Management  04/13/2021  Kristen Whitehead 06-19-1923 [id number]   Telephone Assessment  Spoke with pt today for updated assessment and management of care. Pt indicates she has company and requested a call back on Monday.   RN will follow up on Monday as requested.  Raina Mina, RN Care Management Coordinator [place] Office [phone]

## 2021-04-16 ENCOUNTER — Other Ambulatory Visit: Payer: Self-pay | Admitting: *Deleted

## 2021-04-16 DIAGNOSIS — H401131 Primary open-angle glaucoma, bilateral, mild stage: Secondary | ICD-10-CM | POA: Diagnosis not present

## 2021-04-16 DIAGNOSIS — H04123 Dry eye syndrome of bilateral lacrimal glands: Secondary | ICD-10-CM | POA: Diagnosis not present

## 2021-04-16 DIAGNOSIS — H5203 Hypermetropia, bilateral: Secondary | ICD-10-CM | POA: Diagnosis not present

## 2021-04-16 DIAGNOSIS — H35342 Macular cyst, hole, or pseudohole, left eye: Secondary | ICD-10-CM | POA: Diagnosis not present

## 2021-04-16 DIAGNOSIS — H18423 Band keratopathy, bilateral: Secondary | ICD-10-CM | POA: Diagnosis not present

## 2021-04-16 NOTE — Patient Outreach (Signed)
[facility]) Care Management  04/16/2021  MARKEE MCLENDON 01/27/1923 [id number]   Telephone Assessment-Unsuccessful  RN attempted outreach call however unsuccessful. RN able to leave a HIPAA approved voice message requesting a call back.  Will follow up once gain with another outreach call over the next week.  Raina Mina, RN Care Management Coordinator [place] Office [phone]

## 2021-04-19 ENCOUNTER — Ambulatory Visit (INDEPENDENT_AMBULATORY_CARE_PROVIDER_SITE_OTHER): Payer: Medicare Other | Admitting: Orthopedic Surgery

## 2021-04-19 ENCOUNTER — Encounter: Payer: Self-pay | Admitting: Orthopedic Surgery

## 2021-04-19 ENCOUNTER — Other Ambulatory Visit: Payer: Self-pay

## 2021-04-19 DIAGNOSIS — Z0000 Encounter for general adult medical examination without abnormal findings: Secondary | ICD-10-CM

## 2021-04-19 NOTE — Progress Notes (Signed)
  This service is provided via telemedicine  No vital signs collected/recorded due to the encounter was a telemedicine visit.   Location of patient (ex: home, work):  Home  Patient consents to a telephone visit:  Yes  Location of the provider (ex: office, home):  [employer].  Name of any referring provider:  [name], NP   Names of all persons participating in the telemedicine service and their role in the encounter:  Patient, Heriberto [place]ig[place], [place], [name], NP.    Time spent on call: 8 minutes spent on the phone with Medical Assistant.

## 2021-04-19 NOTE — Progress Notes (Signed)
Subjective:   Kristen Whitehead is a 87 y.o. female who presents for Medicare Annual (Subsequent) preventive examination.  Review of Systems     Cardiac Risk Factors include: advanced age (>36mn, >>32women);sedentary lifestyle;hypertension;dyslipidemia     Objective:    There were no vitals filed for this visit. There is no height or weight on file to calculate BMI.  Advanced Directives 04/19/2021 03/27/2021 11/23/2020 10/20/2020 12/10/2019 12/10/2019 10/07/2019  Does Patient Have a Medical Advance Directive? No No No Yes Yes Yes Yes  Type of Advance Directive - - - - HPress photographerLiving will - HLeroyLiving will  Does patient want to make changes to medical advance directive? - - No - Patient declined No - Patient declined No - Patient declined Yes (ED - Information included in AVS) No - Patient declined  Copy of HMurphysin Chart? - - - - - - No - copy requested  Would patient like information on creating a medical advance directive? No - Patient declined No - Patient declined - - - - -    Current Medications (verified) Outpatient Encounter Medications as of 04/19/2021  Medication Sig   acetaminophen (TYLENOL) 500 MG tablet Take 1 tablet (500 mg total) by mouth every 8 (eight) hours as needed for moderate pain.   amLODipine (NORVASC) 2.5 MG tablet Take 2.5 mg by mouth at bedtime.   atenolol (TENORMIN) 50 MG tablet TAKE 1 TABLET BY MOUTH  TWICE DAILY   Calcium Carbonate-Vitamin D (CALCIUM 600+D PO) Take 1 tablet by mouth 2 (two) times daily.   Cholecalciferol (VITAMIN D) 2000 UNITS tablet Take 4,000 Units by mouth daily.   dorzolamide (TRUSOPT) 2 % ophthalmic solution Place 1 drop into the left eye 2 (two) times daily.   ELIQUIS 2.5 MG TABS tablet TAKE 1 TABLET BY MOUTH  TWICE DAILY   furosemide (LASIX) 40 MG tablet Take 1 tablet (40 mg total) by mouth 2 (two) times daily. May take an additional 40 mg as needed for swelling or weight  gain   isosorbide mononitrate (IMDUR) 30 MG 24 hr tablet TAKE 1 TABLET BY MOUTH  DAILY   latanoprost (XALATAN) 0.005 % ophthalmic solution Place 1 drop into both eyes at bedtime.    levothyroxine (SYNTHROID, LEVOTHROID) 100 MCG tablet Take 100 mcg by mouth daily.    Loperamide HCl (IMODIUM PO) Take 1 capsule by mouth 2 (two) times daily.   MAGNESIUM OXIDE 400 PO Take 800 mg by mouth daily.   Multiple Vitamins-Minerals (CENTRUM SILVER PO) Take 1 tablet by mouth daily.   Multiple Vitamins-Minerals (PRESERVISION AREDS 2 PO) Take 1 capsule by mouth 2 (two) times daily.    nitroGLYCERIN (NITROSTAT) 0.4 MG SL tablet DISSOLVE 1 TABLET UNDER THE TONGUE EVERY 5 MINUTES FOR 3 DOSES AS NEEDED  FOR  CHEST  PAIN   potassium chloride (KLOR-CON) 10 MEQ tablet Take 10 mEq by mouth daily.   pramipexole (MIRAPEX) 0.125 MG tablet TAKE 1 TABLET BY MOUTH IN  THE EVENING   PREMARIN vaginal cream Place 2 g vaginally once a week. Uses on Sunday and wednesday   Probiotic Product (PROBIOTIC DAILY PO) Take 1 tablet by mouth daily.   sodium chloride (MURO 128) 5 % ophthalmic solution Place 1 drop into the right eye at bedtime.    temazepam (RESTORIL) 15 MG capsule TAKE 1 CAPSULE(15 MG TOTAL) BY MOUTH AT BEDTIME.   [DISCONTINUED] benzonatate (TESSALON) 100 MG capsule Take 1 capsule (100 mg total)  by mouth every 8 (eight) hours as needed for cough.   [DISCONTINUED] potassium chloride (KLOR-CON) 10 MEQ tablet TAKE 1 TABLET BY MOUTH  DAILY   No facility-administered encounter medications on file as of 04/19/2021.    Allergies (verified) Azithromycin, Compazine [prochlorperazine edisylate], Demerol, Dolophine [methadone], Ebastine, Erythromycin, Ibuprofen, Meperidine hcl, Morphine sulfate, Statins, Tetanus toxoids, Tetracycline, and Tetracyclines & related   History: Past Medical History:  Diagnosis Date   Bladder prolapse, female, acquired    Cancer [facility])    thyroid   Chest pain    no known ischemic heart disease;  negative Myoview July 2013. EF 75% with no ischemia.    Chronic anticoagulation    Chronic atrial fibrillation (HCC)    managed with rate control and coumadin   Chronic diastolic heart failure (HCC)    GERD (gastroesophageal reflux disease)    Heart disease    History of congenital mitral regurgitation    mild   History of heart attack    Multiple    History of mitral valve prolapse 06/15/2008   a. echo 1/14: mild LVH, EF 60-65%, mild MR, mild to mod BAE, PASP 35   Hypercholesterolemia    Hypertension    Hypothyroidism    Past Surgical History:  Procedure Laterality Date   ABDOMINAL HYSTERECTOMY     CATARACT EXTRACTION     CHOLECYSTECTOMY     Thyroidectomy     TONSILLECTOMY     Family History  Problem Relation Age of Onset   Stroke Mother 58   Stroke Father 30   Heart attack Neg Hx    Heart disease Neg Hx    Social History   Socioeconomic History   Marital status: Widowed    Spouse name: Not on file   Number of children: 3   Years of education: BA   Highest education level: Not on file  Occupational History    Employer: RETIRED  Tobacco Use   Smoking status: Never   Smokeless tobacco: Never  Substance and Sexual Activity   Alcohol use: No   Drug use: No   Sexual activity: Not Currently  Other Topics Concern   Not on file  Social History Narrative   Pt lives at home alone.   Caffeine Use: quit 12yr ago      Diet: No FMaceo Pro     Do you drink/ eat things with caffeineNo      Marital status:   Widowed                            What year were you married ? 1947      Do you live in a house, apartment,assistred living, condo, trailer, etc[place]     Is it one or more stories? 1      How many persons live in your home ? 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  No                             Type & how often       ADVANCED DIRECTIVES (Please bring copies)  Do you have a living will? Yes      Do you have a DNR form?   Yes                    If not, do you want to discuss one?       Do you have signed POA?HPOA forms?  Yes               If so, please bring to your appointment      FUNCTIONAL STATUS- To be completed by Spouse / child / Staff       Do you have difficulty bathing or dressing yourself ?  No      Do you have difficulty preparing food or eating ?  No      Do you have difficulty managing your mediation ?  No      Do you have difficulty managing your finances ?  No      Do you have difficulty affording your medication ?  No      Social Determinants of Radio broadcast assistant Strain: Low Risk    Difficulty of Paying Living Expenses: Not hard at all  Food Insecurity: No Food Insecurity   Worried About Charity fundraiser in the Last Year: Never true   Ran Out of Food in the Last Year: Never true  Transportation Needs: No Transportation Needs   Lack of Transportation (Medical): No   Lack of Transportation (Non-Medical): No  Physical Activity: Inactive   Days of Exercise per Week: 0 days   Minutes of Exercise per Session: 20 min  Stress: No Stress Concern Present   Feeling of Stress : Not at all  Social Connections: Moderately Integrated   Frequency of Communication with Friends and Family: Three times a week   Frequency of Social Gatherings with Friends and Family: Three times a week   Attends Religious Services: 1 to 4 times per year   Active Member of Clubs or Organizations: Yes   Attends Archivist Meetings: More than 4 times per year   Marital Status: Widowed    Tobacco Counseling Counseling given: Not Answered   Clinical Intake:  Pre-visit preparation completed: Yes  Pain : No/denies pain     Nutritional Risks: None Diabetes: No  How often do you need to have someone help you when you read instructions,  pamphlets, or other written materials from your doctor or pharmacy?: 2 - Rarely What is the last grade level you completed in school?: [employer] degree  Diabetic?No  Interpreter Needed?: No      Activities of Daily Living In your present state of health, do you have any difficulty performing the following activities: 04/19/2021  Hearing? N  Vision? N  Difficulty concentrating or making decisions? N  Walking or climbing stairs? Y  Dressing or bathing? N  Doing errands, shopping? Y  Preparing Food and eating ? N  Using the Toilet? N  In the past six months, have you accidently leaked urine? N  Do you have problems with loss of bowel control? N  Managing your Medications? N  Managing your Finances? N  Housekeeping or managing your Housekeeping? N  Some recent data might be hidden    Patient Care Team: [name], NP as PCP - General (Adult Health Nurse Practitioner) [name], MD as PCP - Cardiology (Cardiology) [name], RN as [place] any recent Medical Services you [phone]  have received from other than [facility] providers in the past year (date may be approximate).     Assessment:   This is a routine wellness examination for [place].  Hearing/Vision screen Hearing Screening - Comments:: No Hearing Concerns. Patient doesn't wear hearing aids.  Vision Screening - Comments:: No Vision Concerns. Patient wears prescription glasses. Patient last eye exam was 04/17/2021  Dietary issues and exercise activities discussed: Current Exercise Habits: The patient does not participate in regular exercise at present, Exercise limited by: cardiac condition(s);orthopedic condition(s)   Goals Addressed             This Visit's Progress    Maintain Mobility and Function   On track    Evidence-based guidance:  Acknowledge and validate impact of pain, loss of strength and potential disfigurement (hand osteoarthritis) on mental  health and daily life, such as social isolation, anxiety, depression, impaired sexual relationship and   injury from falls.  Anticipate referral to physical or occupational therapy for assessment, therapeutic exercise and recommendation for adaptive equipment or assistive devices; encourage participation.  Assess impact on ability to perform activities of daily living, as well as engage in sports and leisure events or requirements of work or school.  Provide anticipatory guidance and reassurance about the benefit of exercise to maintain function; acknowledge and normalize fear that exercise may worsen symptoms.  Encourage regular exercise, at least 10 minutes at a time for 45 minutes per week; consider yoga, water exercise and proprioceptive exercises; encourage use of wearable activity tracker to increase motivation and adherence.  Encourage maintenance or resumption of daily activities, including employment, as pain allows and with minimal exposure to trauma.  Assist patient to advocate for adaptations to the work environment.  Consider level of pain and function, gender, age, lifestyle, patient preference, quality of life, readiness and ?ocapacity to benefit? when recommending patients for orthopaedic surgery consultation.  Explore strategies, such as changes to medication regimen or activity that enables patient to anticipate and manage flare-ups that increase deconditioning and disability.  Explore patient preferences; encourage exposure to a broader range of activities that have been avoided for fear of experiencing pain.  Identify barriers to participation in therapy or exercise, such as pain with activity, anticipated or imagined pain.  Monitor postoperative joint replacement or any preexisting joint replacement for ongoing pain and loss of function; provide social support and encouragement throughout recovery.   Notes:        Depression Screen PHQ 2/9 Scores 04/19/2021 04/19/2021 01/06/2020  10/01/2018 04/02/2018 01/08/2018 12/16/2017  PHQ - 2 Score 0 0 0 0 0 0 0    Fall Risk Fall Risk  04/19/2021 04/19/2021 03/27/2021 11/23/2020 10/20/2020  Falls in the past year? '1 1 1 1 1  '$ Number falls in past yr: 0 1 1 0 1  Comment - - - - -  Injury with Fall? '1 1 1 '$ 0 0  Comment - - - - -  Risk for fall due to : History of fall(s);Impaired balance/gait;Impaired mobility History of fall(s) History of fall(s) - -  Follow up Falls evaluation completed;Education provided;Falls prevention discussed Falls evaluation completed Falls evaluation completed - -    FALL RISK PREVENTION PERTAINING TO THE HOME:  Any stairs in or around the home? No  If so, are there any without handrails? No  Home free of loose throw rugs in walkways, pet beds, electrical cords, etc? Yes  Adequate lighting in your home to reduce risk of falls? Yes   ASSISTIVE DEVICES UTILIZED  TO PREVENT FALLS:  Life alert? Yes  Use of a cane, walker or w/c? Yes  Grab bars in the bathroom? No  Shower chair or bench in shower? No  Elevated toilet seat or a handicapped toilet? Yes   TIMED UP AND GO:  Was the test performed? No .  Length of time to ambulate 10 feet: N/A sec.   Gait slow and steady with assistive device  Cognitive Function: MMSE - Mini Mental State Exam 11/23/2020  Orientation to time 4  Orientation to Place 5  Registration 3  Attention/ Calculation 5  Recall 3  Language- name 2 objects 2  Language- repeat 1  Language- follow 3 step command 3  Language- read & follow direction 1  Write a sentence 1  Copy design 1  Total score 29     6CIT Screen 04/19/2021  What Year? 0 points  What month? 0 points  What time? 0 points  Count back from 20 0 points  Months in reverse 0 points  Repeat phrase 2 points  Total Score 2    Immunizations Immunization History  Administered Date(s) Administered   Influenza Split 04/28/2009, 06/07/2010, 04/30/2012, 06/08/2013   Influenza, High Dose Seasonal PF 04/21/2014,  04/17/2016, 06/18/2017, 06/19/2018, 04/16/2019, 07/03/2020   Influenza,inj,Quad PF,6+ Mos 05/20/2011   Influenza,inj,quad, With Preservative 04/13/2015   Moderna SARS-COV2 Booster Vaccination 01/18/2021   Moderna Sars-Covid-2 Vaccination 10/01/2019, 10/29/2019, 06/29/2020   Pneumococcal Conjugate-13 10/01/2013   Pneumococcal Polysaccharide-23 04/12/2010, 06/18/2017   Tdap 05/20/2011   Zoster, Live 06/04/2006    TDAP status: Due, Education has been provided regarding the importance of this vaccine. Advised may receive this vaccine at local pharmacy or Health Dept. Aware to provide a copy of the vaccination record if obtained from local pharmacy or Health Dept. Verbalized acceptance and understanding.  Flu Vaccine status: Due, Education has been provided regarding the importance of this vaccine. Advised may receive this vaccine at local pharmacy or Health Dept. Aware to provide a copy of the vaccination record if obtained from local pharmacy or Health Dept. Verbalized acceptance and understanding.  Pneumococcal vaccine status: Up to date  Covid-19 vaccine status: Completed vaccines  Qualifies for Shingles Vaccine? Yes   Zostavax completed Yes   Shingrix Completed?: No.    Education has been provided regarding the importance of this vaccine. Patient has been advised to call insurance company to determine out of pocket expense if they have not yet received this vaccine. Advised may also receive vaccine at local pharmacy or Health Dept. Verbalized acceptance and understanding.  Screening Tests Health Maintenance  Topic Date Due   INFLUENZA VACCINE  03/19/2021   Zoster Vaccines- Shingrix (1 of 2) 08/18/2021 (Originally 11/16/1941)   DEXA SCAN  10/03/2021 (Originally 11/15/1989)   TETANUS/TDAP  05/19/2021   COVID-19 Vaccine (5 - Booster for Moderna series) 05/20/2021   PNA vac Low Risk Adult  Completed   HPV VACCINES  Aged Out    Health Maintenance  Health Maintenance Due  Topic Date Due    INFLUENZA VACCINE  03/19/2021    Colorectal cancer screening: No longer required.   Mammogram status: No longer required due to advanced age.  Bone Density status: Completed cannot remember. Results reflect: Bone density results: OSTEOPENIA. Repeat every unsure years.  Lung Cancer Screening: (Low Dose CT Chest recommended if Age 81-80 years, 30 pack-year currently smoking OR have quit w/in 15years.) does not qualify.   Lung Cancer Screening Referral: No  Additional Screening:  Hepatitis C Screening: does not  qualify; Completed-   Vision Screening: Recommended annual ophthalmology exams for early detection of glaucoma and other disorders of the eye. Is the patient up to date with their annual eye exam?  Yes  Who is the provider or what is the name of the office in which the patient attends annual eye exams? Dr. [name] If pt is not established with a provider, would they like to be referred to a provider to establish care? No .   Dental Screening: Recommended annual dental exams for proper oral hygiene  Community Resource Referral / Chronic Care Management: CRR required this visit?  No   CCM required this visit?  No      Plan:     I have personally reviewed and noted the following in the patient's chart:   Medical and social history Use of alcohol, tobacco or illicit drugs  Current medications and supplements including opioid prescriptions.  Functional ability and status Nutritional status Physical activity Advanced directives List of other physicians Hospitalizations, surgeries, and ER visits in previous 12 months Vitals Screenings to include cognitive, depression, and falls Referrals and appointments  In addition, I have reviewed and discussed with patient certain preventive protocols, quality metrics, and best practice recommendations. A written personalized care plan for preventive services as well as general preventive health recommendations were provided to  patient.     [name], NP   04/19/2021

## 2021-04-19 NOTE — Patient Instructions (Addendum)
Kristen Whitehead , Thank you for taking time to come for your Medicare Wellness Visit. I appreciate your ongoing commitment to your health goals. Please review the following plan we discussed and let me know if I can assist you in the future.   These are the goals we discussed:  Goals      Blood Pressure < 130/80     Maintain Mobility and Function     Evidence-based guidance:  Acknowledge and validate impact of pain, loss of strength and potential disfigurement (hand osteoarthritis) on mental health and daily life, such as social isolation, anxiety, depression, impaired sexual relationship and   injury from falls.  Anticipate referral to physical or occupational therapy for assessment, therapeutic exercise and recommendation for adaptive equipment or assistive devices; encourage participation.  Assess impact on ability to perform activities of daily living, as well as engage in sports and leisure events or requirements of work or school.  Provide anticipatory guidance and reassurance about the benefit of exercise to maintain function; acknowledge and normalize fear that exercise may worsen symptoms.  Encourage regular exercise, at least 10 minutes at a time for 45 minutes per week; consider yoga, water exercise and proprioceptive exercises; encourage use of wearable activity tracker to increase motivation and adherence.  Encourage maintenance or resumption of daily activities, including employment, as pain allows and with minimal exposure to trauma.  Assist patient to advocate for adaptations to the work environment.  Consider level of pain and function, gender, age, lifestyle, patient preference, quality of life, readiness and ?ocapacity to benefit? when recommending patients for orthopaedic surgery consultation.  Explore strategies, such as changes to medication regimen or activity that enables patient to anticipate and manage flare-ups that increase deconditioning and disability.  Explore  patient preferences; encourage exposure to a broader range of activities that have been avoided for fear of experiencing pain.  Identify barriers to participation in therapy or exercise, such as pain with activity, anticipated or imagined pain.  Monitor postoperative joint replacement or any preexisting joint replacement for ongoing pain and loss of function; provide social support and encouragement throughout recovery.   Notes:      THN-Track and Manage Fluids and Swelling     Follow Up Date 04/13/2021 Timeframe:  Short-Term Goal Priority:  Medium Start Date:     08/02/2020                        Expected End Date:   04/18/2021                   - call office if I gain more than 2 pounds in one day or 5 pounds in one week - keep legs up while sitting - track weight in diary - use salt in moderation - watch for swelling in feet, ankles and legs every day - weigh myself daily    Why is this important?   It is important to check your weight daily and watch how much salt and liquids you have.  It will help you to manage your heart failure.   Barriers: Health Behaviors  Notes: 7/28: Pt denies any swelling with no acute issues. No acute issues reported from her LLE. Pt remains in the  GREEN zone with no problems. Weight remains steady with no fluid retention. 6/10-Pt confirm her weights yesterday and today at 124. 4 lbs and last week 120 lbs with no residual symptoms. Denies any swelling to her LE and pt remains  in the GREEN zone with no acute symptoms.  4/21-Pt continue to weigh daily with some swelling to her LLE however minimal. Pt aware to elevate her LE to reduce her ongoing swelling (history). Will continue to encourage adherence. 3/25-Pt reports some swelling over the last month with increase swelling to her LE 2.5 lbs overnight. Pt reports she will try to obtain a light version at a medical supply story. Also recommended a nursing supply store that maybe less expensive or [facility]  in [place] another resources. Pt aware to elevate throughout the day to prevent further swelling.  2/25-Reports some fluid retention where her provided added additional medications for a few days until back at baseline. No additional symptoms as pt remains on track at this time. Will continue to encouraged ongoing management of care. 09/12/2020- Pt reports weights as followed: Today 128.8 lbs, Yesterday 129.6 lbs and last week at 131.4 lbs. Pt remains in the GREEN zone and remains symptoms free with no acute problems. Discussed baseline weights and ongoing monitoring system for prevention.  08/02/2020-Today weight 129.4 lbs, Yesterday 127.8 lbs and last week 127.2 lbs and pt remains asymptomatic with no swelling.      THN-Track and Manage Symptoms     Follow Up Date  04/13/2021  Timeframe:  Long-Range Goal Priority:  Medium Start Date:    08/02/2020                         Expected End Date:   06/18/2021                  - begin a heart failure diary - bring diary to all appointments - follow rescue plan if symptoms flare-up - know when to call the doctor - track symptoms and what helps feel better or worse    Why is this important?   You will be able to handle your symptoms better if you keep track of them.  Making some simple changes to your lifestyle will help.  Eating healthy is one thing you can do to take good care of yourself.   Barriers: Health Behaviors  Notes:  7/28-No acute symptoms remains in the GREEN zone. Pt continues to report steady weights with no fluid retention.  4/21: Pt continues to do well in the GREEN zone however small amount of swelling to her LLE however minimal and she is aware to elevate this extremity. Reports today's weight 124 lbs, yesterday 125 lbs and last week 129 lbs since that time pt took extra fluid pill to reduce over time.   2/25-Pt verifies she remains in the GREEN zone at this time however episode with 3 lbs where her MD intervened with added  medications until she returned to her baseline weight. This AM 127 lbs and yesterday 128.4 lbs with no additional symptoms.   09/12/2020-Verified pt remains in the GREEN zone with no acute symptoms. Will continue to offer [facility] calendar for ongoing documentation of all weights and/or symptoms.  Discuss the sones and what to do if acute symptoms occur.Will also alert pt on when to contact her provider with symptoms.   08/02/2020-Review the above symptoms and what to do by contacting her provider. Will verify pt remains in the GREEN zone with no acute symptoms.        This is a list of the screening recommended for you and due dates:  Health Maintenance  Topic Date Due   Flu Shot  03/19/2021   Zoster (  Shingles) Vaccine (1 of 2) 08/18/2021*   DEXA scan (bone density measurement)  10/03/2021*   Tetanus Vaccine  05/19/2021   COVID-19 Vaccine (5 - Booster for Moderna series) 05/20/2021   Pneumonia vaccines  Completed   HPV Vaccine  Aged Out  *Topic was postponed. The date shown is not the original due date.   Please get Tetanus and Flu vaccine at your local [facility].   Talk to cardiology about Eliquis co-pay card/program.   We will discuss bone density test next visit.

## 2021-04-20 ENCOUNTER — Other Ambulatory Visit: Payer: Self-pay | Admitting: *Deleted

## 2021-04-20 NOTE — Patient Outreach (Signed)
[facility]) Care Management  04/20/2021  Kristen Whitehead 1922-10-19 [id number]  Telephone Assessment-Requested a call back on Tuesday.  Pt states she is doing well however was not able to conversant today and indicated she would not be available on Wednesday or Friday. RN offered to return a call on next Tuesday. Pt receptive to a call next week.  RN will follow up next week as request to update plan of care and inquire further on pt's ongoing management of care.  Raina Mina, RN Care Management Coordinator [place] Office [phone]

## 2021-04-24 ENCOUNTER — Other Ambulatory Visit: Payer: Self-pay | Admitting: *Deleted

## 2021-04-24 NOTE — Patient Outreach (Signed)
[facility]) Care Management  04/24/2021  Kristen Whitehead September 26, 1922 [id number]   Telephone Assessment-Unsuccessful  RN attempted outreach call today however unsuccessful. RN able to leave a HIPAA approved voice message requesting a call back.  Will rescheduled another outreach call over the next week for ongoing [facility] services and an update on pt's management of care.  Raina Mina, RN Care Management Coordinator [place] Office [phone]

## 2021-04-26 ENCOUNTER — Other Ambulatory Visit: Payer: Self-pay | Admitting: *Deleted

## 2021-04-26 NOTE — Patient Outreach (Signed)
[facility]) Care Management  04/26/2021  Kristen Whitehead 1923/03/22 [id number]  Telephone Assessment-Successful-HF  RN spoke with pt today who indicates she is doing well with no acute issues however updated RN on new CAD provider Dr. Audie Box who has made some changes to her blood pressure medications. Pt states she "feels a lot better" and reported noted changes that were updated on her medication list from the provider's office.  Most recent bp 132/75- 142/91 (asymptomatic). Pt will also update her primary on any changes concerning her medications. Also reports progress related to her HF management of care. No weight gain as pt remains within the GREEN zone at 124-125 lbs with no swelling.   Plan of care discussed with noted changes within the plan of care. Will follow up with pt next month due to the recent changes and pt also reports some death in her family (mortal supported offered) and no counseling needed at this time.    Goals Addressed             This Visit's Progress    Maintain Mobility and Function   On track    Evidence-based guidance:  Acknowledge and validate impact of pain, loss of strength and potential disfigurement (hand osteoarthritis) on mental health and daily life, such as social isolation, anxiety, depression, impaired sexual relationship and   injury from falls.  Anticipate referral to physical or occupational therapy for assessment, therapeutic exercise and recommendation for adaptive equipment or assistive devices; encourage participation.  Assess impact on ability to perform activities of daily living, as well as engage in sports and leisure events or requirements of work or school.  Provide anticipatory guidance and reassurance about the benefit of exercise to maintain function; acknowledge and normalize fear that exercise may worsen symptoms.  Encourage regular exercise, at least 10 minutes at a time for 45 minutes per week; consider yoga,  water exercise and proprioceptive exercises; encourage use of wearable activity tracker to increase motivation and adherence.  Encourage maintenance or resumption of daily activities, including employment, as pain allows and with minimal exposure to trauma.  Assist patient to advocate for adaptations to the work environment.  Consider level of pain and function, gender, age, lifestyle, patient preference, quality of life, readiness and ?ocapacity to benefit? when recommending patients for orthopaedic surgery consultation.  Explore strategies, such as changes to medication regimen or activity that enables patient to anticipate and manage flare-ups that increase deconditioning and disability.  Explore patient preferences; encourage exposure to a broader range of activities that have been avoided for fear of experiencing pain.  Identify barriers to participation in therapy or exercise, such as pain with activity, anticipated or imagined pain.  Monitor postoperative joint replacement or any preexisting joint replacement for ongoing pain and loss of function; provide social support and encouragement throughout recovery.   Notes:      THN-Track and Manage Fluids and Swelling   On track    Follow Up Date 05/24/2021 Timeframe:  Short-Term Goal Priority:  Medium Start Date:     08/02/2020                        Expected End Date:   06/18/2021                   - call office if I gain more than 2 pounds in one day or 5 pounds in one week - keep legs up while sitting - track weight  in diary - use salt in moderation - watch for swelling in feet, ankles and legs every day - weigh myself daily    Why is this important?   It is important to check your weight daily and watch how much salt and liquids you have.  It will help you to manage your heart failure.   Barriers: Health Behaviors  Notes: 9/8- Pt continue to do well in managing her ongoing HF. Reports weights at her baseline between 124-125 lbs  with no weight gain of 3 lbs overnight or 5 lbs within the one week. Pt remains in the GREEN zone with no acute symptoms encountered.  7/28: Pt denies any swelling with no acute issues. No acute issues reported from her LLE. Pt remains in the  GREEN zone with no problems. Weight remains steady with no fluid retention. 6/10-Pt confirm her weights yesterday and today at 124. 4 lbs and last week 120 lbs with no residual symptoms. Denies any swelling to her LE and pt remains in the GREEN zone with no acute symptoms.  4/21-Pt continue to weigh daily with some swelling to her LLE however minimal. Pt aware to elevate her LE to reduce her ongoing swelling (history). Will continue to encourage adherence. 3/25-Pt reports some swelling over the last month with increase swelling to her LE 2.5 lbs overnight. Pt reports she will try to obtain a light version at a medical supply story. Also recommended a nursing supply store that maybe less expensive or [facility] therapy in [place] another resources. Pt aware to elevate throughout the day to prevent further swelling.  2/25-Reports some fluid retention where her provided added additional medications for a few days until back at baseline. No additional symptoms as pt remains on track at this time. Will continue to encouraged ongoing management of care. 09/12/2020- Pt reports weights as followed: Today 128.8 lbs, Yesterday 129.6 lbs and last week at 131.4 lbs. Pt remains in the GREEN zone and remains symptoms free with no acute problems. Discussed baseline weights and ongoing monitoring system for prevention.  08/02/2020-Today weight 129.4 lbs, Yesterday 127.8 lbs and last week 127.2 lbs and pt remains asymptomatic with no swelling.      THN-Track and Manage Symptoms   On track    Follow Up Date  05/24/2021  Timeframe:  Long-Range Goal Priority:  Medium Start Date:    08/02/2020                         Expected End Date:   06/18/2021                  - begin a heart  failure diary - bring diary to all appointments - follow rescue plan if symptoms flare-up - know when to call the doctor - track symptoms and what helps feel better or worse    Why is this important?   You will be able to handle your symptoms better if you keep track of them.  Making some simple changes to your lifestyle will help.  Eating healthy is one thing you can do to take good care of yourself.   Barriers: Health Behaviors  Notes:  7/28-No acute symptoms remains in the GREEN zone. Pt continues to report steady weights with no fluid retention.  4/21: Pt continues to do well in the GREEN zone however small amount of swelling to her LLE however minimal and she is aware to elevate this extremity. Reports today's weight 124 lbs,  yesterday 125 lbs and last week 129 lbs since that time pt took extra fluid pill to reduce over time.   2/25-Pt verifies she remains in the GREEN zone at this time however episode with 3 lbs where her MD intervened with added medications until she returned to her baseline weight. This AM 127 lbs and yesterday 128.4 lbs with no additional symptoms.   09/12/2020-Verified pt remains in the GREEN zone with no acute symptoms. Will continue to offer [facility] calendar for ongoing documentation of all weights and/or symptoms.  Discuss the sones and what to do if acute symptoms occur.Will also alert pt on when to contact her provider with symptoms.   08/02/2020-Review the above symptoms and what to do by contacting her provider. Will verify pt remains in the GREEN zone with no acute symptoms.         Raina Mina, RN Care Management Coordinator [place] Office [phone]

## 2021-04-30 ENCOUNTER — Telehealth: Payer: Self-pay | Admitting: *Deleted

## 2021-04-30 ENCOUNTER — Telehealth: Payer: Self-pay | Admitting: Cardiovascular Disease

## 2021-04-30 NOTE — Telephone Encounter (Signed)
Patient is requesting to speak with Dr. Blenda Mounts nurse.  She states LPN called her last week and she is returning the call.

## 2021-04-30 NOTE — Telephone Encounter (Signed)
Blood pressure doing better since stopping and feeling better since stopping Irbesartan  Diastolic blood pressure has been increasing  This am 142/90, last night 147/98 yesterday am DBP was 92 These readings are all before taking am or pm medications   Will forward to Dr Oval Linsey for review

## 2021-04-30 NOTE — Telephone Encounter (Signed)
Returned call to patient who states that she was just calling in to let [name] know that she is feeling better and her BP is better since stopping the Irbesartan. Patient is unable to provide readings today. Patient is inquiring about another alternative to the Omeprazole that she is on but states that she will reach out to PCP regarding this. Patient states she would like Melinda to give her a call back if possible. Advised patient I would forward the message to her. Patient verbalized understanding.

## 2021-04-30 NOTE — Telephone Encounter (Signed)
Patient called and stated that she wanted to let Amy know that she Stopped taking her Omeprazole. Stated that she understands that she is not suppose to stop without letting her provider know.   I explained that last OV note states that Amy is aware that patient stopped taking the Omeprazole and it was not in her current medication list anymore.    Patient agreed and stated that she had to go she had another call coming in.     OV Note Dated 04/12/2021: 5. Gastroesophageal reflux disease without esophagitis - stopped taking omeprazole  - denies reflux

## 2021-05-14 ENCOUNTER — Ambulatory Visit (INDEPENDENT_AMBULATORY_CARE_PROVIDER_SITE_OTHER): Payer: Medicare Other | Admitting: Orthopedic Surgery

## 2021-05-14 ENCOUNTER — Other Ambulatory Visit: Payer: Self-pay

## 2021-05-14 ENCOUNTER — Encounter: Payer: Self-pay | Admitting: Orthopedic Surgery

## 2021-05-14 VITALS — BP 110/70 | HR 67 | Temp 97.0°F | Ht 60.0 in | Wt 123.0 lb

## 2021-05-14 DIAGNOSIS — K5901 Slow transit constipation: Secondary | ICD-10-CM

## 2021-05-14 DIAGNOSIS — I10 Essential (primary) hypertension: Secondary | ICD-10-CM

## 2021-05-14 NOTE — Progress Notes (Signed)
Careteam: Patient Care Team: Kristen Alanis, NP as PCP - General (Adult Health Nurse Practitioner) [name], MD as PCP - Cardiology (Cardiology) [name], RN as [place] Management  Seen by: [name], AGNP-C  PLACE OF SERVICE:  [place]  Advanced Directive information    Allergies  Allergen Reactions   Azithromycin Nausea Only and Other (See Comments)   Compazine [Prochlorperazine Edisylate] Nausea Only   Demerol Nausea Only   Dolophine [Methadone] Nausea Only   Ebastine Nausea Only    EBS   Erythromycin Nausea Only and Other (See Comments)   Ibuprofen Nausea Only and Other (See Comments)   Meperidine Hcl Other (See Comments)   Morphine Sulfate Other (See Comments)   Statins Other (See Comments)    myalgias   Tetanus Toxoids Nausea Only   Tetracycline Other (See Comments)   Tetracyclines & Related Nausea Only    Chief Complaint  Patient presents with   Acute Visit    Patient complains of stomach "intestines" hurting, constipation and blood pressure is rising. Going on for a while. Patient states bladder is dropping. No bowel movement for about a week   Health Maintenance    Flu vaccine     HPI: Patient is a 87 y.o. female seen today for acute visit due to constipation.   She has not had a normal sized bowel movement since 09/24, small amount, but soft. 09/21 she reports bowel movements watery. She does not take anything to help promote bowel movements. Reports eating a well balanced diet with fruits and vegetables. She does take metamucil daily for diarrhea/bulking stools and imodium daily. Today, she feels the urge to go all the time. Denies abdominal pain and vomiting. Admits to some nausea. She has been eating small meals and nausea has subsided. Denis  She has also brought her recent blood pressures to be checked. Average readings < 150/90. She has not had any readings < 100/70 in the past few weeks since medications were  adjusted. Continues to follow low sodium diet. Denies chest pain, sob headaches or dizziness.     Review of Systems:  Review of Systems  Constitutional:  Negative for chills, fever, malaise/fatigue and weight loss.  Respiratory:  Negative for cough, shortness of breath and wheezing.   Cardiovascular:  Positive for leg swelling. Negative for chest pain.  Gastrointestinal:  Positive for constipation and nausea. Negative for abdominal pain, blood in stool, diarrhea, heartburn and vomiting.  Genitourinary:  Negative for dysuria, frequency and hematuria.  Musculoskeletal:  Negative for falls.  Neurological:  Negative for dizziness, weakness and headaches.  Psychiatric/Behavioral:  Negative for depression. The patient is nervous/anxious.    Past Medical History:  Diagnosis Date   Bladder prolapse, female, acquired    Cancer [facility])    thyroid   Chest pain    no known ischemic heart disease; negative Myoview July 2013. EF 75% with no ischemia.    Chronic anticoagulation    Chronic atrial fibrillation (HCC)    managed with rate control and coumadin   Chronic diastolic heart failure (HCC)    GERD (gastroesophageal reflux disease)    Heart disease    History of congenital mitral regurgitation    mild   History of heart attack    Multiple    History of mitral valve prolapse 06/15/2008   a. echo 1/14: mild LVH, EF 60-65%, mild MR, mild to mod BAE, PASP 35   Hypercholesterolemia    Hypertension  Hypothyroidism    Past Surgical History:  Procedure Laterality Date   ABDOMINAL HYSTERECTOMY     CATARACT EXTRACTION     CHOLECYSTECTOMY     Thyroidectomy     TONSILLECTOMY     Social History:   reports that she has never smoked. She has never used smokeless tobacco. She reports that she does not drink alcohol and does not use drugs.  Family History  Problem Relation Age of Onset   Stroke Mother 21   Stroke Father 79   Heart attack Neg Hx    Heart disease Neg Hx      Medications: Patient's Medications  New Prescriptions   No medications on file  Previous Medications   ACETAMINOPHEN (TYLENOL) 500 MG TABLET    Take 1 tablet (500 mg total) by mouth every 8 (eight) hours as needed for moderate pain.   AMLODIPINE (NORVASC) 2.5 MG TABLET    Take 2.5 mg by mouth at bedtime.   ATENOLOL (TENORMIN) 50 MG TABLET    TAKE 1 TABLET BY MOUTH  TWICE DAILY   CALCIUM CARBONATE-VITAMIN D (CALCIUM 600+D PO)    Take 1 tablet by mouth 2 (two) times daily.   CHOLECALCIFEROL (VITAMIN D) 2000 UNITS TABLET    Take 4,000 Units by mouth daily.   DORZOLAMIDE (TRUSOPT) 2 % OPHTHALMIC SOLUTION    Place 1 drop into the left eye 2 (two) times daily.   ELIQUIS 2.5 MG TABS TABLET    TAKE 1 TABLET BY MOUTH  TWICE DAILY   FUROSEMIDE (LASIX) 40 MG TABLET    Take 1 tablet (40 mg total) by mouth 2 (two) times daily. May take an additional 40 mg as needed for swelling or weight gain   ISOSORBIDE MONONITRATE (IMDUR) 30 MG 24 HR TABLET    TAKE 1 TABLET BY MOUTH  DAILY   LATANOPROST (XALATAN) 0.005 % OPHTHALMIC SOLUTION    Place 1 drop into both eyes at bedtime.    LEVOTHYROXINE (SYNTHROID, LEVOTHROID) 100 MCG TABLET    Take 100 mcg by mouth daily.    LOPERAMIDE HCL (IMODIUM PO)    Take 1 capsule by mouth 2 (two) times daily.   MAGNESIUM OXIDE 400 PO    Take 800 mg by mouth daily.   MULTIPLE VITAMINS-MINERALS (CENTRUM SILVER PO)    Take 1 tablet by mouth daily.   MULTIPLE VITAMINS-MINERALS (PRESERVISION AREDS 2 PO)    Take 1 capsule by mouth 2 (two) times daily.    NITROGLYCERIN (NITROSTAT) 0.4 MG SL TABLET    DISSOLVE 1 TABLET UNDER THE TONGUE EVERY 5 MINUTES FOR 3 DOSES AS NEEDED  FOR  CHEST  PAIN   POTASSIUM CHLORIDE (KLOR-CON) 10 MEQ TABLET    Take 10 mEq by mouth daily.   PRAMIPEXOLE (MIRAPEX) 0.125 MG TABLET    TAKE 1 TABLET BY MOUTH IN  THE EVENING   PREMARIN VAGINAL CREAM    Place 2 g vaginally once a week. Uses on Sunday and wednesday   PROBIOTIC PRODUCT (PROBIOTIC DAILY PO)    Take  1 tablet by mouth daily.   SODIUM CHLORIDE (MURO 128) 5 % OPHTHALMIC SOLUTION    Place 1 drop into the right eye at bedtime.    TEMAZEPAM (RESTORIL) 15 MG CAPSULE    TAKE 1 CAPSULE(15 MG TOTAL) BY MOUTH AT BEDTIME.  Modified Medications   No medications on file  Discontinued Medications   No medications on file    Physical Exam:  Vitals:   05/14/21 1406  BP:  110/70  Pulse: 67  Temp: (!) 97 F (36.1 C)  SpO2: 95%  Weight: 123 lb (55.8 kg)  Height: 5' (1.524 m)   Body mass index is 24.02 kg/m. Wt Readings from Last 3 Encounters:  05/14/21 123 lb (55.8 kg)  04/12/21 123 lb 12.8 oz (56.2 kg)  03/27/21 125 lb 6.4 oz (56.9 kg)    Physical Exam Vitals reviewed.  Constitutional:      General: She is not in acute distress. HENT:     Head: Normocephalic.  Eyes:     General:        Right eye: No discharge.        Left eye: No discharge.  Cardiovascular:     Rate and Rhythm: Normal rate and regular rhythm.     Pulses: Normal pulses.     Heart sounds: Normal heart sounds. No murmur heard. Pulmonary:     Effort: Pulmonary effort is normal. No respiratory distress.     Breath sounds: Normal breath sounds. No wheezing.  Abdominal:     General: Bowel sounds are normal. There is no distension.     Palpations: Abdomen is soft. There is no mass.     Tenderness: There is no abdominal tenderness.     Hernia: No hernia is present.  Genitourinary:    Comments: No impaction on rectal exam Musculoskeletal:     Right lower leg: Edema present.     Left lower leg: Edema present.     Comments: Non-pitting, compression stockings on  Skin:    General: Skin is warm and dry.     Capillary Refill: Capillary refill takes less than 2 seconds.  Neurological:     General: No focal deficit present.     Mental Status: She is alert and oriented to person, place, and time.     Motor: Weakness present.     Gait: Gait abnormal.     Comments: Cane/walker  Psychiatric:        Mood and Affect:  Mood normal.        Behavior: Behavior normal.    Labs reviewed: Basic Metabolic Panel: Recent Labs    11/23/20 1417 04/12/21 1219  NA 138 135  K 4.1 4.0  CL 100 96*  CO2 28 31  GLUCOSE 92 83  BUN 12 10  CREATININE 0.77 0.72  CALCIUM 8.5* 8.5*  TSH  --  0.41   Liver Function Tests: Recent Labs    04/12/21 1219  AST 19  ALT 12  BILITOT 0.8  PROT 6.6   No results for input(s): LIPASE, AMYLASE in the last 8760 hours. No results for input(s): AMMONIA in the last 8760 hours. CBC: Recent Labs    11/23/20 1417 04/12/21 1219  WBC 5.7 5.2  NEUTROABS 2,457 2,246  HGB 15.3 14.1  HCT 46.4* 42.1  MCV 93.5 96.1  PLT 246 268   Lipid Panel: No results for input(s): CHOL, HDL, LDLCALC, TRIG, CHOLHDL, LDLDIRECT in the last 8760 hours. TSH: Recent Labs    04/12/21 1219  TSH 0.41   A1C: No results found for: HGBA1C   Assessment/Plan 1. Slow transit constipation - abdomen soft, active bowel sounds, no distension - No impaction noted on rectal exam - advised to stop metamucil - 4 samples miralax given in office - start miralax bid prn x 3 days, then QOD after bowel movement - recommend eating prunes - recommend hydration - polyethylene glycol powder (GLYCOLAX/MIRALAX) 17 GM/SCOOP powder; Take 17 g by mouth in the morning  and at bedtime for 3 days.  Dispense: 225 g; Refill: 1 - polyethylene glycol powder (GLYCOLAX/MIRALAX) 17 GM/SCOOP powder; Take 17 g by mouth every other day.  Dispense: 225 g; Refill: 1  2. Essential hypertension - controlled - cont amlodipine, atenolol  Total time: 25 minutes. Greater than 50% of total time spent doing patient education on diet and medication management.   Next appt: 08/16/2021  [name], [facility] [phone]

## 2021-05-14 NOTE — Telephone Encounter (Signed)
May 13, 2021 [name], MD to Me   4:50 PM OK good.  Continue current regimen.   TCR    Blood pressure doing ok  Advised patient, verbalized understanding

## 2021-05-14 NOTE — Patient Instructions (Signed)
Please take miralax twice daily until first bowel movement OR no more than 3 days. After first bowel movement, you may take Miralax daily or every other day.   Eat prunes  Stay hydrated  Stop metamucil  Please contact PCP if you have increased abdominal pain, abdominal distension, nausea, vomiting

## 2021-05-15 MED ORDER — POLYETHYLENE GLYCOL 3350 17 GM/SCOOP PO POWD
17.0000 g | ORAL | 1 refills | Status: DC
Start: 2021-05-15 — End: 2021-06-14

## 2021-05-15 MED ORDER — POLYETHYLENE GLYCOL 3350 17 GM/SCOOP PO POWD: 17.0000 g | Freq: Two times a day (BID) | ORAL | 1 refills | Status: AC

## 2021-05-17 ENCOUNTER — Other Ambulatory Visit: Payer: Self-pay | Admitting: Neurology

## 2021-05-17 NOTE — Telephone Encounter (Signed)
Spoke with pt to renew will need appt at least once yearly.  I offered next appt and 08-07-21 with Dr. Rexene Alberts [phone], but she said she will see if her pcp will renew mirapex, if so she will not need to come in to see [place], unless that changes.  She has appt with her 08-16-21 and will address then.  I relayed will renew for 90 day and she will let us know what they say.   She appreciated call back.

## 2021-05-21 DIAGNOSIS — E890 Postprocedural hypothyroidism: Secondary | ICD-10-CM | POA: Diagnosis not present

## 2021-05-21 DIAGNOSIS — Z85850 Personal history of malignant neoplasm of thyroid: Secondary | ICD-10-CM | POA: Diagnosis not present

## 2021-05-24 ENCOUNTER — Other Ambulatory Visit: Payer: Self-pay | Admitting: *Deleted

## 2021-05-24 NOTE — Patient Outreach (Signed)
[facility]) Care Management  05/24/2021  Kristen Whitehead [birth year] [id number]   Telephone Assessment-Unsuccessful  RN attempted outreach call today however unsuccessful. RN able to leave a HIPAA approved voice message requesting a call back.  Will follow up once again over the next week.     Raina Mina, RN Care Management Coordinator [place] Office [phone]

## 2021-05-27 ENCOUNTER — Other Ambulatory Visit: Payer: Self-pay | Admitting: Cardiovascular Disease

## 2021-05-30 ENCOUNTER — Encounter (INDEPENDENT_AMBULATORY_CARE_PROVIDER_SITE_OTHER): Payer: Medicare Other | Admitting: Ophthalmology

## 2021-05-30 ENCOUNTER — Other Ambulatory Visit: Payer: Self-pay

## 2021-05-30 DIAGNOSIS — H353211 Exudative age-related macular degeneration, right eye, with active choroidal neovascularization: Secondary | ICD-10-CM | POA: Diagnosis not present

## 2021-05-30 DIAGNOSIS — H43813 Vitreous degeneration, bilateral: Secondary | ICD-10-CM | POA: Diagnosis not present

## 2021-05-30 DIAGNOSIS — H353122 Nonexudative age-related macular degeneration, left eye, intermediate dry stage: Secondary | ICD-10-CM | POA: Diagnosis not present

## 2021-05-30 DIAGNOSIS — I10 Essential (primary) hypertension: Secondary | ICD-10-CM | POA: Diagnosis not present

## 2021-05-30 DIAGNOSIS — H35342 Macular cyst, hole, or pseudohole, left eye: Secondary | ICD-10-CM | POA: Diagnosis not present

## 2021-05-30 DIAGNOSIS — H35033 Hypertensive retinopathy, bilateral: Secondary | ICD-10-CM

## 2021-05-31 ENCOUNTER — Other Ambulatory Visit: Payer: Self-pay | Admitting: *Deleted

## 2021-05-31 NOTE — Patient Outreach (Signed)
[facility]) Care Management  05/31/2021  Kristen Whitehead 21-Sep-1922 [id number]  Telephone Assessment-Successful-HTN  RN spoke with pt today and reviewed/discussed the current plan of care. Updated accordingly within the plan of care and address all inquires and pt's progress. RN also address pt's HF with no reported symptoms as pt remains in the GREEN zone. Pt state some BP issues however resolved with medication as prescribed.   RN offered to follow up next month or if she had a preference for quarterly however pt request to stay on monthly due to her cardiologist has move further away and she feel more comfortable with a follow up call monthly for this RN case manager.   Will scheduled the next follow up call next month for ongoing care management services.    Goals Addressed             This Visit's Progress    THN-Track and Manage Fluids and Swelling   On track    Follow Up Date 07/02/2021 Timeframe:  Short-Term Goal Priority:  Medium Start Date:     08/02/2020                        Expected End Date:   07/18/2021                   - call office if I gain more than 2 pounds in one day or 5 pounds in one week - keep legs up while sitting - track weight in diary - use salt in moderation - watch for swelling in feet, ankles and legs every day - weigh myself daily    Why is this important?   It is important to check your weight daily and watch how much salt and liquids you have.  It will help you to manage your heart failure.   Barriers: Health Behaviors  Notes: 10/13-Pt reports no swelling and she continue to to be aware of what to do if acute symptoms. Reports her weight has been 123-125 lbs with no residual symptoms. Denies any swelling or related issues. Remains in the GREEN zone. 9/8- Pt continue to do well in managing her ongoing HF. Reports weights at her baseline between 124-125 lbs with no weight gain of 3 lbs overnight or 5 lbs within the  one week. Pt remains in the GREEN zone with no acute symptoms encountered.  7/28: Pt denies any swelling with no acute issues. No acute issues reported from her LLE. Pt remains in the  GREEN zone with no problems. Weight remains steady with no fluid retention. 6/10-Pt confirm her weights yesterday and today at 124. 4 lbs and last week 120 lbs with no residual symptoms. Denies any swelling to her LE and pt remains in the GREEN zone with no acute symptoms.  4/21-Pt continue to weigh daily with some swelling to her LLE however minimal. Pt aware to elevate her LE to reduce her ongoing swelling (history). Will continue to encourage adherence. 3/25-Pt reports some swelling over the last month with increase swelling to her LE 2.5 lbs overnight. Pt reports she will try to obtain a light version at a medical supply story. Also recommended a nursing supply store that maybe less expensive or [facility] in [place] another resources. Pt aware to elevate throughout the day to prevent further swelling.  2/25-Reports some fluid retention where her provided added additional medications for a few days until back at baseline. No additional symptoms as  pt remains on track at this time. Will continue to encouraged ongoing management of care. 09/12/2020- Pt reports weights as followed: Today 128.8 lbs, Yesterday 129.6 lbs and last week at 131.4 lbs. Pt remains in the GREEN zone and remains symptoms free with no acute problems. Discussed baseline weights and ongoing monitoring system for prevention.  08/02/2020-Today weight 129.4 lbs, Yesterday 127.8 lbs and last week 127.2 lbs and pt remains asymptomatic with no swelling.      THN-Track and Manage Symptoms   On track    Follow Up Date  07/02/2021  Timeframe:  Long-Range Goal Priority:  Medium Start Date:    08/02/2020                         Expected End Date:   09/18/2021                - begin a heart failure diary - bring diary to all appointments - follow  rescue plan if symptoms flare-up - know when to call the doctor - track symptoms and what helps feel better or worse    Why is this important?   You will be able to handle your symptoms better if you keep track of them.  Making some simple changes to your lifestyle will help.  Eating healthy is one thing you can do to take good care of yourself.   Barriers: Health Behaviors  Notes: 10/13- Reports she remains in the GREEN zone with no HF symptoms however pt reports her BP was elevated on a previous day at 182/97 due to some ongoing stress however pt continue to take her medications and today BP was 117/74. Much better with no residual issues. Will encourage pt to take her BP for several days regularly and monitor for any residual symptoms that may need to be addressed with her provider if not improved after medication dosage.  7/28-No acute symptoms remains in the GREEN zone. Pt continues to report steady weights with no fluid retention.  4/21: Pt continues to do well in the GREEN zone however small amount of swelling to her LLE however minimal and she is aware to elevate this extremity. Reports today's weight 124 lbs, yesterday 125 lbs and last week 129 lbs since that time pt took extra fluid pill to reduce over time.   2/25-Pt verifies she remains in the GREEN zone at this time however episode with 3 lbs where her MD intervened with added medications until she returned to her baseline weight. This AM 127 lbs and yesterday 128.4 lbs with no additional symptoms.   09/12/2020-Verified pt remains in the GREEN zone with no acute symptoms. Will continue to offer [facility] calendar for ongoing documentation of all weights and/or symptoms.  Discuss the sones and what to do if acute symptoms occur.Will also alert pt on when to contact her provider with symptoms.   08/02/2020-Review the above symptoms and what to do by contacting her provider. Will verify pt remains in the GREEN zone with no acute symptoms.         Raina Mina, RN Care Management Coordinator [place] Office [phone]

## 2021-06-13 ENCOUNTER — Telehealth: Payer: Self-pay

## 2021-06-13 NOTE — Telephone Encounter (Signed)
I do not remember who she is followed by. I have a lot of patient who like Dr. Gershon Crane or Dr. Luberta Mutter.

## 2021-06-13 NOTE — Telephone Encounter (Signed)
Patent would like to know if you could refer her for a second opinion for her eyes.  Please advise.  Message routed to [name], NP

## 2021-06-14 ENCOUNTER — Telehealth: Payer: Self-pay

## 2021-06-14 ENCOUNTER — Encounter: Payer: Self-pay | Admitting: Orthopedic Surgery

## 2021-06-14 ENCOUNTER — Other Ambulatory Visit: Payer: Self-pay

## 2021-06-14 ENCOUNTER — Ambulatory Visit (INDEPENDENT_AMBULATORY_CARE_PROVIDER_SITE_OTHER): Payer: Medicare Other | Admitting: Orthopedic Surgery

## 2021-06-14 DIAGNOSIS — G4709 Other insomnia: Secondary | ICD-10-CM | POA: Diagnosis not present

## 2021-06-14 DIAGNOSIS — R197 Diarrhea, unspecified: Secondary | ICD-10-CM

## 2021-06-14 DIAGNOSIS — Z9181 History of falling: Secondary | ICD-10-CM | POA: Diagnosis not present

## 2021-06-14 MED ORDER — TEMAZEPAM 15 MG PO CAPS: ORAL_CAPSULE | ORAL | 1 refills | Status: DC

## 2021-06-14 NOTE — Progress Notes (Signed)
Careteam: Patient Care Team: Kristen Alanis, NP as PCP - General (Adult Health Nurse Practitioner) [name], MD as PCP - Cardiology (Cardiology) [name], RN as [place] Management  Seen by: [name], AGNP-C  PLACE OF SERVICE:  [place]  Advanced Directive information    Allergies  Allergen Reactions   Azithromycin Nausea Only and Other (See Comments)   Compazine [Prochlorperazine Edisylate] Nausea Only   Demerol Nausea Only   Dolophine [Methadone] Nausea Only   Ebastine Nausea Only    EBS   Erythromycin Nausea Only and Other (See Comments)   Ibuprofen Nausea Only and Other (See Comments)   Meperidine Hcl Other (See Comments)   Morphine Sulfate Other (See Comments)   Statins Other (See Comments)    myalgias   Tetanus Toxoids Nausea Only   Tetracycline Other (See Comments)   Tetracyclines & Related Nausea Only    Chief Complaint  Patient presents with   Acute Visit    Patient requesting to discuss medications (Mirapex), diarrhea x 3 days, and recent fall      HPI: Patient is a 87 y.o. female seen today via telephone visit due to diarrhea x 3 days and recent fall.   Diarrhea started 06/11/2021. No recent contact with sick persons. Diarrhea stimulated with eating food. She will eat a meal and then 1-2 hours later have multiple episodes of diarrhea.  She has taken 4 doses of Imodium since symptoms began. She has a history of chronic diarrhea and has used Imodium for years. Currently eating one meal daily. Reports loosing 5 lbs since symptoms began. She has been eating foods like bananas, mashed potatoes, baked chicken. Denies fever, abdominal pain, nausea or vomiting. No diarrhea today. She has not been taking her probiotic in past few months.   Since diarrhea began she reports feeling weaker. Last night she was sitting on the toilet and slipped off onto her buttocks. She bumped her right shoulder on the way down. She denies  increased pain to shoulder. Denies hitting her head. She was able to get up on her own. Continues to ambulate with cane. We discussed restarting PT. At this time she is not interested.   Recently received flu vaccination and 3rd covid booster.   Review of Systems:  Review of Systems  Constitutional:  Positive for malaise/fatigue and weight loss. Negative for chills and fever.  Respiratory:  Negative for cough, shortness of breath and wheezing.   Cardiovascular:  Negative for chest pain and leg swelling.  Gastrointestinal:  Positive for diarrhea. Negative for abdominal pain, blood in stool, constipation, heartburn, nausea and vomiting.  Genitourinary:  Positive for frequency. Negative for dysuria and hematuria.  Musculoskeletal:  Positive for falls. Negative for joint pain and myalgias.  Neurological:  Positive for weakness. Negative for dizziness and headaches.  Psychiatric/Behavioral:  Negative for depression. The patient is not nervous/anxious.    Past Medical History:  Diagnosis Date   Bladder prolapse, female, acquired    Cancer [facility])    thyroid   Chest pain    no known ischemic heart disease; negative Myoview July 2013. EF 75% with no ischemia.    Chronic anticoagulation    Chronic atrial fibrillation (HCC)    managed with rate control and coumadin   Chronic diastolic heart failure (HCC)    GERD (gastroesophageal reflux disease)    Heart disease    History of congenital mitral regurgitation    mild   History of heart attack  Multiple    History of mitral valve prolapse 06/15/2008   a. echo 1/14: mild LVH, EF 60-65%, mild MR, mild to mod BAE, PASP 35   Hypercholesterolemia    Hypertension    Hypothyroidism    Past Surgical History:  Procedure Laterality Date   ABDOMINAL HYSTERECTOMY     CATARACT EXTRACTION     CHOLECYSTECTOMY     Thyroidectomy     TONSILLECTOMY     Social History:   reports that she has never smoked. She has never used smokeless tobacco. She  reports that she does not drink alcohol and does not use drugs.  Family History  Problem Relation Age of Onset   Stroke Mother 41   Stroke Father 69   Heart attack Neg Hx    Heart disease Neg Hx     Medications: Patient's Medications  New Prescriptions   No medications on file  Previous Medications   ACETAMINOPHEN (TYLENOL) 500 MG TABLET    Take 1 tablet (500 mg total) by mouth every 8 (eight) hours as needed for moderate pain.   AMLODIPINE (NORVASC) 2.5 MG TABLET    Take 2.5 mg by mouth at bedtime.   ATENOLOL (TENORMIN) 50 MG TABLET    TAKE 1 TABLET BY MOUTH  TWICE DAILY   CALCIUM CARBONATE-VITAMIN D (CALCIUM 600+D PO)    Take 1 tablet by mouth 2 (two) times daily.   CHOLECALCIFEROL (VITAMIN D) 2000 UNITS TABLET    Take 4,000 Units by mouth daily.   DORZOLAMIDE (TRUSOPT) 2 % OPHTHALMIC SOLUTION    Place 1 drop into the left eye 2 (two) times daily.   ELIQUIS 2.5 MG TABS TABLET    TAKE 1 TABLET BY MOUTH  TWICE DAILY   FUROSEMIDE (LASIX) 40 MG TABLET    Take 1 tablet (40 mg total) by mouth 2 (two) times daily. May take an additional 40 mg as needed for swelling or weight gain   ISOSORBIDE MONONITRATE (IMDUR) 30 MG 24 HR TABLET    TAKE 1 TABLET BY MOUTH  DAILY   LATANOPROST (XALATAN) 0.005 % OPHTHALMIC SOLUTION    Place 1 drop into both eyes at bedtime.    LEVOTHYROXINE (SYNTHROID, LEVOTHROID) 100 MCG TABLET    Take 100 mcg by mouth daily.    LOPERAMIDE HCL (IMODIUM PO)    Take 1 capsule by mouth 2 (two) times daily as needed.   MAGNESIUM OXIDE 400 PO    Take 800 mg by mouth daily.   MULTIPLE VITAMINS-MINERALS (CENTRUM SILVER PO)    Take 1 tablet by mouth daily.   MULTIPLE VITAMINS-MINERALS (PRESERVISION AREDS 2 PO)    Take 1 capsule by mouth 2 (two) times daily.    NITROGLYCERIN (NITROSTAT) 0.4 MG SL TABLET    DISSOLVE 1 TABLET UNDER THE TONGUE EVERY 5 MINUTES FOR 3 DOSES AS NEEDED  FOR  CHEST  PAIN   POTASSIUM CHLORIDE (KLOR-CON) 10 MEQ TABLET    Take 10 mEq by mouth daily.    PRAMIPEXOLE (MIRAPEX) 0.125 MG TABLET    TAKE 1 TABLET BY MOUTH IN  THE EVENING   PREMARIN VAGINAL CREAM    Place 2 g vaginally 2 (two) times a week. Uses on Sunday and wednesday   PROBIOTIC PRODUCT (PROBIOTIC DAILY PO)    Take 1 tablet by mouth daily.   SODIUM CHLORIDE (MURO 128) 5 % OPHTHALMIC SOLUTION    Place 1 drop into the right eye at bedtime.    TEMAZEPAM (RESTORIL) 15 MG CAPSULE  TAKE 1 CAPSULE(15 MG TOTAL) BY MOUTH AT BEDTIME.  Modified Medications   No medications on file  Discontinued Medications   POLYETHYLENE GLYCOL POWDER (GLYCOLAX/MIRALAX) 17 GM/SCOOP POWDER    Take 17 g by mouth every other day.    Physical Exam: Unable to complete- telephone visit  There were no vitals filed for this visit. There is no height or weight on file to calculate BMI. Wt Readings from Last 3 Encounters:  05/14/21 123 lb (55.8 kg)  04/12/21 123 lb 12.8 oz (56.2 kg)  03/27/21 125 lb 6.4 oz (56.9 kg)    Physical Exam  Labs reviewed: Basic Metabolic Panel: Recent Labs    11/23/20 1417 04/12/21 1219  NA 138 135  K 4.1 4.0  CL 100 96*  CO2 28 31  GLUCOSE 92 83  BUN 12 10  CREATININE 0.77 0.72  CALCIUM 8.5* 8.5*  TSH  --  0.41   Liver Function Tests: Recent Labs    04/12/21 1219  AST 19  ALT 12  BILITOT 0.8  PROT 6.6   No results for input(s): LIPASE, AMYLASE in the last 8760 hours. No results for input(s): AMMONIA in the last 8760 hours. CBC: Recent Labs    11/23/20 1417 04/12/21 1219  WBC 5.7 5.2  NEUTROABS 2,457 2,246  HGB 15.3 14.1  HCT 46.4* 42.1  MCV 93.5 96.1  PLT 246 268   Lipid Panel: No results for input(s): CHOL, HDL, LDLCALC, TRIG, CHOLHDL, LDLDIRECT in the last 8760 hours. TSH: Recent Labs    04/12/21 1219  TSH 0.41   A1C: No results found for: HGBA1C   Assessment/Plan 1. Other insomnia - temazepam (RESTORIL) 15 MG capsule; TAKE 1 CAPSULE(15 MG TOTAL) BY MOUTH AT BEDTIME.  Dispense: 90 capsule; Refill: 1  2. Diarrhea, unspecified type -  symptoms began 10/24 - diarrhea stimulated with eating food - she has taken 4 doses of Imodium this week - reports no diarrhea today after eating - discussed dehydration and weakness - recommend drinking Gatorade or electrolyte drink- 1 cup bid - continue to eat bland foods- slowly increase intake - contact PCP is symptoms have not improved  3. History of recent fall - slid off toilet yesterday - denies pain or injury to head - cont to ambulate with cane - recommend restarting PT if falls/weakness persists- refused by patient at this time    Virtual Visit via Telephone Note   I connected with Kristen Whitehead by telephone and verified that I am speaking with the correct person using two identifiers.   Patient Location: home Patient: Kristen Whitehead Provider: [name], AGNP-C Provider Location: :[place]    I discussed the limitations, risks, security and privacy concerns of performing an evaluation and management service by telephone and the availability of in person appointments. I also discussed with the patient that there may be a patient responsible charge related to this service. The patient expressed understanding and agreed to proceed.    I discussed the assessment and treatment plan with the patient. The patient was provided an opportunity to ask questions and all were answered. The patient agreed with the plan and demonstrated an understanding of the instructions.     The patient was advised to call back or seek an in-person evaluation if the symptoms worsen or if the condition fails to improve as anticipated.   I provided 15 minutes of non-face-to-face time during this encounter.   [name], AGNP-C  Avs printed and mailed   Next appt:  08/16/2021  Marika Mahaffy Edgar, [facility] [phone]

## 2021-06-14 NOTE — Progress Notes (Signed)
Careteam: Patient Care Team: [name], NP as PCP - General (Adult Health Nurse Practitioner) [name], MD as PCP - Cardiology (Cardiology) [name], RN as [place] Management  Seen by: [name], AGNP-C  PLACE OF SERVICE:  [place]  Advanced Directive information    Allergies  Allergen Reactions   Azithromycin Nausea Only and Other (See Comments)   Compazine [Prochlorperazine Edisylate] Nausea Only   Demerol Nausea Only   Dolophine [Methadone] Nausea Only   Ebastine Nausea Only    EBS   Erythromycin Nausea Only and Other (See Comments)   Ibuprofen Nausea Only and Other (See Comments)   Meperidine Hcl Other (See Comments)   Morphine Sulfate Other (See Comments)   Statins Other (See Comments)    myalgias   Tetanus Toxoids Nausea Only   Tetracycline Other (See Comments)   Tetracyclines & Related Nausea Only    Chief Complaint  Patient presents with   Acute Visit    Patient requesting to discuss medications (Mirapex), diarrhea x 3 days, and recent fall      HPI: Patient is a 87 y.o. female   Review of Systems:  ROS  Past Medical History:  Diagnosis Date   Bladder prolapse, female, acquired    Cancer [facility])    thyroid   Chest pain    no known ischemic heart disease; negative Myoview July 2013. EF 75% with no ischemia.    Chronic anticoagulation    Chronic atrial fibrillation (HCC)    managed with rate control and coumadin   Chronic diastolic heart failure (HCC)    GERD (gastroesophageal reflux disease)    Heart disease    History of congenital mitral regurgitation    mild   History of heart attack    Multiple    History of mitral valve prolapse 06/15/2008   a. echo 1/14: mild LVH, EF 60-65%, mild MR, mild to mod BAE, PASP 35   Hypercholesterolemia    Hypertension    Hypothyroidism    Past Surgical History:  Procedure Laterality Date   ABDOMINAL HYSTERECTOMY     CATARACT EXTRACTION     CHOLECYSTECTOMY      Thyroidectomy     TONSILLECTOMY     Social History:   reports that she has never smoked. She has never used smokeless tobacco. She reports that she does not drink alcohol and does not use drugs.  Family History  Problem Relation Age of Onset   Stroke Mother 65   Stroke Father 41   Heart attack Neg Hx    Heart disease Neg Hx     Medications: Patient's Medications  New Prescriptions   No medications on file  Previous Medications   ACETAMINOPHEN (TYLENOL) 500 MG TABLET    Take 1 tablet (500 mg total) by mouth every 8 (eight) hours as needed for moderate pain.   AMLODIPINE (NORVASC) 2.5 MG TABLET    Take 2.5 mg by mouth at bedtime.   ATENOLOL (TENORMIN) 50 MG TABLET    TAKE 1 TABLET BY MOUTH  TWICE DAILY   CALCIUM CARBONATE-VITAMIN D (CALCIUM 600+D PO)    Take 1 tablet by mouth 2 (two) times daily.   CHOLECALCIFEROL (VITAMIN D) 2000 UNITS TABLET    Take 4,000 Units by mouth daily.   DORZOLAMIDE (TRUSOPT) 2 % OPHTHALMIC SOLUTION    Place 1 drop into the left eye 2 (two) times daily.   ELIQUIS 2.5 MG TABS TABLET    TAKE 1  TABLET BY MOUTH  TWICE DAILY   FUROSEMIDE (LASIX) 40 MG TABLET    Take 1 tablet (40 mg total) by mouth 2 (two) times daily. May take an additional 40 mg as needed for swelling or weight gain   ISOSORBIDE MONONITRATE (IMDUR) 30 MG 24 HR TABLET    TAKE 1 TABLET BY MOUTH  DAILY   LATANOPROST (XALATAN) 0.005 % OPHTHALMIC SOLUTION    Place 1 drop into both eyes at bedtime.    LEVOTHYROXINE (SYNTHROID, LEVOTHROID) 100 MCG TABLET    Take 100 mcg by mouth daily.    LOPERAMIDE HCL (IMODIUM PO)    Take 1 capsule by mouth 2 (two) times daily as needed.   MAGNESIUM OXIDE 400 PO    Take 800 mg by mouth daily.   MULTIPLE VITAMINS-MINERALS (CENTRUM SILVER PO)    Take 1 tablet by mouth daily.   MULTIPLE VITAMINS-MINERALS (PRESERVISION AREDS 2 PO)    Take 1 capsule by mouth 2 (two) times daily.    NITROGLYCERIN (NITROSTAT) 0.4 MG SL TABLET    DISSOLVE 1 TABLET UNDER THE TONGUE EVERY 5  MINUTES FOR 3 DOSES AS NEEDED  FOR  CHEST  PAIN   POTASSIUM CHLORIDE (KLOR-CON) 10 MEQ TABLET    Take 10 mEq by mouth daily.   PRAMIPEXOLE (MIRAPEX) 0.125 MG TABLET    TAKE 1 TABLET BY MOUTH IN  THE EVENING   PREMARIN VAGINAL CREAM    Place 2 g vaginally 2 (two) times a week. Uses on Sunday and wednesday   PROBIOTIC PRODUCT (PROBIOTIC DAILY PO)    Take 1 tablet by mouth daily.   SODIUM CHLORIDE (MURO 128) 5 % OPHTHALMIC SOLUTION    Place 1 drop into the right eye at bedtime.    TEMAZEPAM (RESTORIL) 15 MG CAPSULE    TAKE 1 CAPSULE(15 MG TOTAL) BY MOUTH AT BEDTIME.  Modified Medications   No medications on file  Discontinued Medications   POLYETHYLENE GLYCOL POWDER (GLYCOLAX/MIRALAX) 17 GM/SCOOP POWDER    Take 17 g by mouth every other day.    Physical Exam:  There were no vitals filed for this visit. There is no height or weight on file to calculate BMI. Wt Readings from Last 3 Encounters:  05/14/21 123 lb (55.8 kg)  04/12/21 123 lb 12.8 oz (56.2 kg)  03/27/21 125 lb 6.4 oz (56.9 kg)    Physical Exam  Labs reviewed: Basic Metabolic Panel: Recent Labs    11/23/20 1417 04/12/21 1219  NA 138 135  K 4.1 4.0  CL 100 96*  CO2 28 31  GLUCOSE 92 83  BUN 12 10  CREATININE 0.77 0.72  CALCIUM 8.5* 8.5*  TSH  --  0.41   Liver Function Tests: Recent Labs    04/12/21 1219  AST 19  ALT 12  BILITOT 0.8  PROT 6.6   No results for input(s): LIPASE, AMYLASE in the last 8760 hours. No results for input(s): AMMONIA in the last 8760 hours. CBC: Recent Labs    11/23/20 1417 04/12/21 1219  WBC 5.7 5.2  NEUTROABS 2,457 2,246  HGB 15.3 14.1  HCT 46.4* 42.1  MCV 93.5 96.1  PLT 246 268   Lipid Panel: No results for input(s): CHOL, HDL, LDLCALC, TRIG, CHOLHDL, LDLDIRECT in the last 8760 hours. TSH: Recent Labs    04/12/21 1219  TSH 0.41   A1C: No results found for: HGBA1C   Assessment/Plan There are no diagnoses linked to this encounter.  Next appt:  [name],  [place]  [facility] [phone]

## 2021-06-14 NOTE — Progress Notes (Signed)
   This service is provided via telemedicine  No vital signs collected/recorded due to the encounter was a telemedicine visit.   Location of patient (ex: home, work):  Home  Patient consents to a telephone visit: Yes, see telephone visit dated 06/14/21   Location of the provider (ex: office, home):  [facility], Office   Name of any referring provider:  N/A  Names of all persons participating in the telemedicine service and their role in the encounter:  S.Chrae B/CMA, [name], NP, and Patient   Time spent on call:  7 min with medical assistant

## 2021-06-14 NOTE — Telephone Encounter (Signed)
[name], utke are scheduled for a virtual visit with your provider today.    Just as we do with appointments in the office, we must obtain your consent to participate.  Your consent will be active for this visit and any virtual visit you may have with one of our providers in the next 365 days.    If you have a MyChart account, I can also send a copy of this consent to you electronically.  All virtual visits are billed to your insurance company just like a traditional visit in the office.  As this is a virtual visit, video technology does not allow for your provider to perform a traditional examination.  This may limit your provider's ability to fully assess your condition.  If your provider identifies any concerns that need to be evaluated in person or the need to arrange testing such as labs, EKG, etc, we will make arrangements to do so.    Although advances in technology are sophisticated, we cannot ensure that it will always work on either your end or our end.  If the connection with a video visit is poor, we may have to switch to a telephone visit.  With either a video or telephone visit, we are not always able to ensure that we have a secure connection.   I need to obtain your verbal consent now.   Are you willing to proceed with your visit today?   [name] has provided verbal consent on 06/14/2021 for a virtual visit (video or telephone).   Leigh Aurora [place], [place] 06/14/2021  1:45 PM

## 2021-06-14 NOTE — Patient Instructions (Signed)
Start to slowly eat more foods to prevent weakness  Please drink Gatorade or electrolyte drink (1 cup twice daily) to prevent dehydration  Please start taking probiotic again   If symptoms persist, please schedule follow up next week

## 2021-06-14 NOTE — Telephone Encounter (Signed)
Patient notified and stated that she has a Building control surveyor today with Amy.

## 2021-06-18 DIAGNOSIS — Z85828 Personal history of other malignant neoplasm of skin: Secondary | ICD-10-CM | POA: Diagnosis not present

## 2021-06-18 DIAGNOSIS — L570 Actinic keratosis: Secondary | ICD-10-CM | POA: Diagnosis not present

## 2021-06-18 DIAGNOSIS — D692 Other nonthrombocytopenic purpura: Secondary | ICD-10-CM | POA: Diagnosis not present

## 2021-07-02 ENCOUNTER — Other Ambulatory Visit: Payer: Self-pay | Admitting: *Deleted

## 2021-07-02 NOTE — Patient Outreach (Signed)
[facility]) Care Management  07/02/2021  Kristen Whitehead 1923-06-29 [id number]   Telephone Assessment-HTN  Spoke with pt today and verified pt continue to do well however related a recent fall from a sitting position with no related injuries. Plan of care review and discussed with noted documentation. Due to pt's progress with refer to a Health Coach for ongoing disease management of care.  Will make a referral and HC. Pt also inquired about finding an eye doctor. Will proivded "Find a Doctor" hotline contacts for assistance with that request.   Goals Addressed             This Visit's Progress    Maintain Mobility and Function   On track    Evidence-based guidance:  Acknowledge and validate impact of pain, loss of strength and potential disfigurement (hand osteoarthritis) on mental health and daily life, such as social isolation, anxiety, depression, impaired sexual relationship and   injury from falls.  Anticipate referral to physical or occupational therapy for assessment, therapeutic exercise and recommendation for adaptive equipment or assistive devices; encourage participation.  Assess impact on ability to perform activities of daily living, as well as engage in sports and leisure events or requirements of work or school.  Provide anticipatory guidance and reassurance about the benefit of exercise to maintain function; acknowledge and normalize fear that exercise may worsen symptoms.  Encourage regular exercise, at least 10 minutes at a time for 45 minutes per week; consider yoga, water exercise and proprioceptive exercises; encourage use of wearable activity tracker to increase motivation and adherence.  Encourage maintenance or resumption of daily activities, including employment, as pain allows and with minimal exposure to trauma.  Assist patient to advocate for adaptations to the work environment.  Consider level of pain and function, gender, age,  lifestyle, patient preference, quality of life, readiness and capacity to benefit when recommending patients for orthopaedic surgery consultation.  Explore strategies, such as changes to medication regimen or activity that enables patient to anticipate and manage flare-ups that increase deconditioning and disability.  Explore patient preferences; encourage exposure to a broader range of activities that have been avoided for fear of experiencing pain.  Identify barriers to participation in therapy or exercise, such as pain with activity, anticipated or imagined pain.  Monitor postoperative joint replacement or any preexisting joint replacement for ongoing pain and loss of function; provide social support and encouragement throughout recovery.   Notes:      COMPLETED: THN-Track and Manage Fluids and Swelling   On track    Follow Up Date 07/02/2021 Timeframe:  Short-Term Goal Priority:  Medium Start Date:     08/02/2020                        Expected End Date:   07/18/2021                   - call office if I gain more than 2 pounds in one day or 5 pounds in one week - keep legs up while sitting - track weight in diary - use salt in moderation - watch for swelling in feet, ankles and legs every day - weigh myself daily    Why is this important?   It is important to check your weight daily and watch how much salt and liquids you have.  It will help you to manage your heart failure.   Barriers: Health Behaviors  Notes: 11/14- Pt continues to  do well with no related symptoms at 120 lbs with no swelling to LE.  10/13-Pt reports no swelling and she continue to to be aware of what to do if acute symptoms. Reports her weight has been 123-125 lbs with no residual symptoms. Denies any swelling or related issues. Remains in the GREEN zone. 9/8- Pt continue to do well in managing her ongoing HF. Reports weights at her baseline between 124-125 lbs with no weight gain of 3 lbs overnight or 5 lbs  within the one week. Pt remains in the GREEN zone with no acute symptoms encountered.  7/28: Pt denies any swelling with no acute issues. No acute issues reported from her LLE. Pt remains in the  GREEN zone with no problems. Weight remains steady with no fluid retention. 6/10-Pt confirm her weights yesterday and today at 124. 4 lbs and last week 120 lbs with no residual symptoms. Denies any swelling to her LE and pt remains in the GREEN zone with no acute symptoms.  4/21-Pt continue to weigh daily with some swelling to her LLE however minimal. Pt aware to elevate her LE to reduce her ongoing swelling (history). Will continue to encourage adherence. 3/25-Pt reports some swelling over the last month with increase swelling to her LE 2.5 lbs overnight. Pt reports she will try to obtain a light version at a medical supply story. Also recommended a nursing supply store that maybe less expensive or [facility] in [place] another resources. Pt aware to elevate throughout the day to prevent further swelling.  2/25-Reports some fluid retention where her provided added additional medications for a few days until back at baseline. No additional symptoms as pt remains on track at this time. Will continue to encouraged ongoing management of care. 09/12/2020- Pt reports weights as followed: Today 128.8 lbs, Yesterday 129.6 lbs and last week at 131.4 lbs. Pt remains in the GREEN zone and remains symptoms free with no acute problems. Discussed baseline weights and ongoing monitoring system for prevention.  08/02/2020-Today weight 129.4 lbs, Yesterday 127.8 lbs and last week 127.2 lbs and pt remains asymptomatic with no swelling.      THN-Track and Manage Symptoms   On track    Follow Up Date  07/02/2021  Timeframe:  Long-Range Goal Priority:  Medium Start Date:    08/02/2020                         Expected End Date:   09/18/2021                - begin a heart failure diary - bring diary to all appointments -  follow rescue plan if symptoms flare-up - know when to call the doctor - track symptoms and what helps feel better or worse    Why is this important?   You will be able to handle your symptoms better if you keep track of them.  Making some simple changes to your lifestyle will help.  Eating healthy is one thing you can do to take good care of yourself.   Barriers: Health Behaviors  Notes: 10/13- Reports she remains in the GREEN zone with no HF symptoms however pt reports her BP was elevated on a previous day at 182/97 due to some ongoing stress however pt continue to take her medications and today BP was 117/74. Much better with no residual issues. Will encourage pt to take her BP for several days regularly and monitor for any residual symptoms  that may need to be addressed with her provider if not improved after medication dosage.  7/28-No acute symptoms remains in the GREEN zone. Pt continues to report steady weights with no fluid retention.  4/21: Pt continues to do well in the GREEN zone however small amount of swelling to her LLE however minimal and she is aware to elevate this extremity. Reports today's weight 124 lbs, yesterday 125 lbs and last week 129 lbs since that time pt took extra fluid pill to reduce over time.   2/25-Pt verifies she remains in the GREEN zone at this time however episode with 3 lbs where her MD intervened with added medications until she returned to her baseline weight. This AM 127 lbs and yesterday 128.4 lbs with no additional symptoms.   09/12/2020-Verified pt remains in the GREEN zone with no acute symptoms. Will continue to offer [facility] calendar for ongoing documentation of all weights and/or symptoms.  Discuss the sones and what to do if acute symptoms occur.Will also alert pt on when to contact her provider with symptoms.   08/02/2020-Review the above symptoms and what to do by contacting her provider. Will verify pt remains in the GREEN zone with no acute  symptoms.         Elliot Cousin, RN Care Management Coordinator [facility] Main Office [phone]

## 2021-07-04 ENCOUNTER — Other Ambulatory Visit: Payer: Self-pay | Admitting: *Deleted

## 2021-07-09 NOTE — Progress Notes (Signed)
Cardiology Office Note:   Date:  07/10/2021  NAME:  Kristen Whitehead    MRN: [id number] DOB:  05-02-1923   PCP:  [name], NP  Cardiologist:  [name], MD  Electrophysiologist:  None   Referring MD: [name], NP   Chief Complaint  Patient presents with   Follow-up         History of Present Illness:   Kristen Whitehead is a 87 y.o. female with a hx of permament Afib, HTN, HFpEF who presents for follow-up.  She reports she is doing well.  She is still living alone.  She did stop driving 2 weeks ago.  She now has a driver and get her around town.  She does have permanent atrial fibrillation.  She is not aware of her A. fib.  Rate is controlled on atenolol.  Heart rate in the 70s today.  BP 128/66.  She is on Norvasc 2.5 mg daily.  She is on Eliquis.  She reports no bleeding.  No falls recently.  She does have HFpEF and takes 40 mg twice daily.  She reports no missed doses and has no issues.  She takes Imdur 30 mg daily as well.  She overall denies any symptoms of chest pain or trouble breathing.  She seems to doing quite well.  Vitals are stable.  She is widowed.  She has 3 children who are now senior citizens.  She has several grandchildren.  She is euvolemic on exam and reports she is doing quite well.  Problem List Permament Afib -CHADSVASC=4 (age, female, HTN) 2. HTN 3. HFpEF   Past Medical History: Past Medical History:  Diagnosis Date   Bladder prolapse, female, acquired    Cancer [facility])    thyroid   Chest pain    no known ischemic heart disease; negative Myoview July 2013. EF 75% with no ischemia.    Chronic anticoagulation    Chronic atrial fibrillation (HCC)    managed with rate control and coumadin   Chronic diastolic heart failure (HCC)    GERD (gastroesophageal reflux disease)    Heart disease    History of congenital mitral regurgitation    mild   History of heart attack    Multiple    History of mitral valve prolapse 06/15/2008   a. echo  1/14: mild LVH, EF 60-65%, mild MR, mild to mod BAE, PASP 35   Hypercholesterolemia    Hypertension    Hypothyroidism     Past Surgical History: Past Surgical History:  Procedure Laterality Date   ABDOMINAL HYSTERECTOMY     CATARACT EXTRACTION     CHOLECYSTECTOMY     Thyroidectomy     TONSILLECTOMY      Current Medications: Current Meds  Medication Sig   acetaminophen (TYLENOL) 500 MG tablet Take 1 tablet (500 mg total) by mouth every 8 (eight) hours as needed for moderate pain.   amLODipine (NORVASC) 2.5 MG tablet Take 2.5 mg by mouth at bedtime.   atenolol (TENORMIN) 50 MG tablet TAKE 1 TABLET BY MOUTH  TWICE DAILY   Calcium Carbonate-Vitamin D (CALCIUM 600+D PO) Take 1 tablet by mouth 2 (two) times daily.   Cholecalciferol (VITAMIN D) 2000 UNITS tablet Take 4,000 Units by mouth daily.   dorzolamide (TRUSOPT) 2 % ophthalmic solution Place 1 drop into the left eye 2 (two) times daily.   ELIQUIS 2.5 MG TABS tablet TAKE 1 TABLET BY MOUTH  TWICE DAILY   furosemide (LASIX) 40 MG tablet  Take 1 tablet (40 mg total) by mouth 2 (two) times daily. May take an additional 40 mg as needed for swelling or weight gain   isosorbide mononitrate (IMDUR) 30 MG 24 hr tablet TAKE 1 TABLET BY MOUTH  DAILY   latanoprost (XALATAN) 0.005 % ophthalmic solution Place 1 drop into both eyes at bedtime.    levothyroxine (SYNTHROID, LEVOTHROID) 100 MCG tablet Take 100 mcg by mouth daily.    Loperamide HCl (IMODIUM PO) Take 1 capsule by mouth 2 (two) times daily as needed.   MAGNESIUM OXIDE 400 PO Take 800 mg by mouth daily.   Multiple Vitamins-Minerals (CENTRUM SILVER PO) Take 1 tablet by mouth daily.   Multiple Vitamins-Minerals (PRESERVISION AREDS 2 PO) Take 1 capsule by mouth 2 (two) times daily.    nitroGLYCERIN (NITROSTAT) 0.4 MG SL tablet DISSOLVE 1 TABLET UNDER THE TONGUE EVERY 5 MINUTES FOR 3 DOSES AS NEEDED  FOR  CHEST  PAIN   potassium chloride (KLOR-CON) 10 MEQ tablet Take 10 mEq by mouth daily.    PREMARIN vaginal cream Place 2 g vaginally 2 (two) times a week. Uses on Sunday and wednesday   Probiotic Product (PROBIOTIC DAILY PO) Take 1 tablet by mouth daily.   sodium chloride (MURO 128) 5 % ophthalmic solution Place 1 drop into the right eye at bedtime.    temazepam (RESTORIL) 15 MG capsule TAKE 1 CAPSULE(15 MG TOTAL) BY MOUTH AT BEDTIME.   [DISCONTINUED] pramipexole (MIRAPEX) 0.125 MG tablet TAKE 1 TABLET BY MOUTH IN  THE EVENING     Allergies:    Azithromycin, Compazine [prochlorperazine edisylate], Demerol, Dolophine [methadone], Ebastine, Erythromycin, Ibuprofen, Meperidine hcl, Morphine sulfate, Statins, Tetanus toxoids, Tetracycline, and Tetracyclines & related   Social History: Social History   Socioeconomic History   Marital status: Widowed    Spouse name: Not on file   Number of children: 3   Years of education: BA   Highest education level: Not on file  Occupational History    Employer: RETIRED   Occupation: Retired [employer]  Tobacco Use   Smoking status: Never   Smokeless tobacco: Never  Vaping Use   Vaping Use: Never used  Substance and Sexual Activity   Alcohol use: No   Drug use: No   Sexual activity: Not Currently  Other Topics Concern   Not on file  Social History Narrative   Pt lives at home alone.   Caffeine Use: quit 57yrs ago      Diet: No Maceo Pro      Do you drink/ eat things with caffeineNo      Marital status:   Widowed                            What year were you married ? 1947      Do you live in a house, apartment,assistred living, condo, trailer, etc.)? [place]      Is it one or more stories? 1      How many persons live in your home ? 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  No  Type & how often       ADVANCED DIRECTIVES  (Please bring copies)      Do you have a living will? Yes      Do you have a DNR form?   Yes                    If not, do you want to discuss one?       Do you have signed POA?HPOA forms?  Yes               If so, please bring to your appointment      FUNCTIONAL STATUS- To be completed by Spouse / child / Staff       Do you have difficulty bathing or dressing yourself ?  No      Do you have difficulty preparing food or eating ?  No      Do you have difficulty managing your mediation ?  No      Do you have difficulty managing your finances ?  No      Do you have difficulty affording your medication ?  No      Social Determinants of Radio broadcast assistant Strain: Low Risk    Difficulty of Paying Living Expenses: Not hard at all  Food Insecurity: No Food Insecurity   Worried About Charity fundraiser in the Last Year: Never true   Ran Out of Food in the Last Year: Never true  Transportation Needs: No Transportation Needs   Lack of Transportation (Medical): No   Lack of Transportation (Non-Medical): No  Physical Activity: Inactive   Days of Exercise per Week: 0 days   Minutes of Exercise per Session: 20 min  Stress: No Stress Concern Present   Feeling of Stress : Not at all  Social Connections: Moderately Integrated   Frequency of Communication with Friends and Family: Three times a week   Frequency of Social Gatherings with Friends and Family: Three times a week   Attends Religious Services: 1 to 4 times per year   Active Member of Clubs or Organizations: Yes   Attends Archivist Meetings: More than 4 times per year   Marital Status: Widowed     Family History: The patient's family history includes Stroke (age of onset: 33) in her mother; Stroke (age of onset: 56) in her father. There is no history of Heart attack or Heart disease.  ROS:   All other ROS reviewed and negative. Pertinent positives noted in the HPI.     EKGs/Labs/Other Studies Reviewed:    The following studies were personally reviewed by me today:  TTE 12/11/2019  1. Left ventricular ejection fraction, by estimation, is 70 to 75%. The  left ventricle has hyperdynamic function. The left ventricle has no  regional wall motion abnormalities. Left ventricular diastolic parameters  are indeterminate.   2. Right ventricular systolic function is normal. The right ventricular  size is normal.   3. Left atrial size was mildly dilated.   4. Right atrial size was mildly dilated.   5. The mitral valve is normal in structure. Trivial mitral valve  regurgitation. No evidence of mitral stenosis.   6. The aortic valve is tricuspid. Aortic valve regurgitation is trivial.  Mild to moderate aortic valve sclerosis/calcification is present, without  any evidence of aortic stenosis.   7. The inferior vena cava is dilated in size with <50% respiratory  variability, suggesting right atrial pressure of  15 mmHg.   Recent Labs: 04/12/2021: ALT 12; BUN 10; Creat 0.72; Hemoglobin 14.1; Platelets 268; Potassium 4.0; Sodium 135; TSH 0.41   Recent Lipid Panel    Component Value Date/Time   CHOL 211 (H) 11/04/2012 1524   TRIG 201.0 (H) 11/04/2012 1524   HDL 66.80 11/04/2012 1524   CHOLHDL 3 11/04/2012 1524   VLDL 40.2 (H) 11/04/2012 1524   LDLCALC 109 (H) 04/16/2012 1043   LDLDIRECT 98.8 11/04/2012 1524    Physical Exam:   VS:  BP 126/66   Pulse 70   Ht 4\' 11"  (1.499 m)   Wt 122 lb 6.4 oz (55.5 kg)   SpO2 95%   BMI 24.72 kg/m    Wt Readings from Last 3 Encounters:  07/10/21 122 lb 6.4 oz (55.5 kg)  05/14/21 123 lb (55.8 kg)  04/12/21 123 lb 12.8 oz (56.2 kg)    General: Well nourished, well developed, in no acute distress Head: Atraumatic, normal size  Eyes: PEERLA, EOMI  Neck: Supple, no JVD Endocrine: No thryomegaly Cardiac: Normal S1, S2; irregular rhythm, no murmurs  Lungs: Clear to auscultation bilaterally, no wheezing, rhonchi or rales  Abd: Soft, nontender, no hepatomegaly   Ext: No edema, pulses 2+ Musculoskeletal: No deformities, BUE and BLE strength normal and equal Skin: Warm and dry, no rashes   Neuro: Alert and oriented to person, place, time, and situation, CNII-XII grossly intact, no focal deficits  Psych: Normal mood and affect   ASSESSMENT:   Kristen Whitehead is a 87 y.o. female who presents for the following: 1. Permanent atrial fibrillation ([place])   2. Essential hypertension   3. Chronic diastolic heart failure (HCC)     PLAN:   1. Permanent atrial fibrillation (HCC) -CHADSVASC=4.  On Eliquis 2.5 mg twice daily.  This is the appropriate dose given her age as well as body weight less than 60 kg.  She is rate controlled with atenolol.  Would recommend to continue with a rate control strategy.  She is really without any symptoms from her atrial fibrillation.  2. Essential hypertension -Well-controlled.  No change with medications.  3. Chronic diastolic heart failure (HCC) -Euvolemic on exam.  She takes 40 mg of Lasix daily.  She can take an extra tablet as needed.  Overall doing well without any symptoms.  Disposition: Return in about 1 year (around 07/10/2022).  Medication Adjustments/Labs and Tests Ordered: Current medicines are reviewed at length with the patient today.  Concerns regarding medicines are outlined above.  No orders of the defined types were placed in this encounter.  No orders of the defined types were placed in this encounter.   Patient Instructions  Medication Instructions:  The current medical regimen is effective;  continue present plan and medications.  *If you need a refill on your cardiac medications before your next appointment, please call your pharmacy*  Follow-Up: At [facility], you and your health needs are our priority.  As part of our continuing mission to provide you with exceptional heart care, we have created designated Provider Care Teams.  These Care Teams include your primary Cardiologist  (physician) and Advanced Practice Providers (APPs -  Physician Assistants and Nurse Practitioners) who all work together to provide you with the care you need, when you need it.  We recommend signing up for the patient portal called "MyChart".  Sign up information is provided on this After Visit Summary.  MyChart is used to connect with patients for Virtual Visits (Telemedicine).  Patients are  able to view lab/test results, encounter notes, upcoming appointments, etc.  Non-urgent messages can be sent to your provider as well.   To learn more about what you can do with MyChart, go to [email or web address].    Your next appointment:   12 month(s)  The format for your next appointment:   In Person  Provider:   Eleonore Chiquito, MD     Time Spent with Patient: I have spent a total of 25 minutes with patient reviewing hospital notes, telemetry, EKGs, labs and examining the patient as well as establishing an assessment and plan that was discussed with the patient.  > 50% of time was spent in direct patient care.  Signed, Addison Naegeli. Audie Box, MD, [place]  [address], [place], [place] [address] [phone]  07/10/2021 4:57 PM

## 2021-07-10 ENCOUNTER — Ambulatory Visit: Payer: Medicare Other | Admitting: Cardiovascular Disease

## 2021-07-10 ENCOUNTER — Other Ambulatory Visit: Payer: Self-pay

## 2021-07-10 ENCOUNTER — Encounter: Payer: Self-pay | Admitting: Cardiovascular Disease

## 2021-07-10 VITALS — BP 126/66 | HR 70 | Ht 59.0 in | Wt 122.4 lb

## 2021-07-10 DIAGNOSIS — I4821 Permanent atrial fibrillation: Secondary | ICD-10-CM | POA: Diagnosis not present

## 2021-07-10 DIAGNOSIS — I5032 Chronic diastolic (congestive) heart failure: Secondary | ICD-10-CM | POA: Diagnosis not present

## 2021-07-10 DIAGNOSIS — I10 Essential (primary) hypertension: Secondary | ICD-10-CM | POA: Diagnosis not present

## 2021-07-10 NOTE — Patient Instructions (Signed)

## 2021-07-17 DIAGNOSIS — N8182 Incompetence or weakening of pubocervical tissue: Secondary | ICD-10-CM | POA: Diagnosis not present

## 2021-07-18 DIAGNOSIS — H35363 Drusen (degenerative) of macula, bilateral: Secondary | ICD-10-CM | POA: Diagnosis not present

## 2021-07-18 DIAGNOSIS — H35342 Macular cyst, hole, or pseudohole, left eye: Secondary | ICD-10-CM | POA: Diagnosis not present

## 2021-07-18 DIAGNOSIS — H35373 Puckering of macula, bilateral: Secondary | ICD-10-CM | POA: Diagnosis not present

## 2021-07-18 DIAGNOSIS — H353211 Exudative age-related macular degeneration, right eye, with active choroidal neovascularization: Secondary | ICD-10-CM | POA: Diagnosis not present

## 2021-07-24 DIAGNOSIS — H353211 Exudative age-related macular degeneration, right eye, with active choroidal neovascularization: Secondary | ICD-10-CM | POA: Diagnosis not present

## 2021-07-30 ENCOUNTER — Encounter (INDEPENDENT_AMBULATORY_CARE_PROVIDER_SITE_OTHER): Payer: Medicare Other | Admitting: Ophthalmology

## 2021-08-07 ENCOUNTER — Other Ambulatory Visit: Payer: Self-pay | Admitting: *Deleted

## 2021-08-07 NOTE — Patient Outreach (Signed)
[facility]) Care Management  08/07/2021  LAKECIA DESCHAMPS 1923-07-21 [id number]  Referral received from Johny Shock, RN for medication assistance with Eliquis. Care Management services were sent to [place] for follow up.    Ina Homes [facility] Management Assistant [phone]

## 2021-08-07 NOTE — Patient Outreach (Signed)
[facility]) Care Management [place] Note   08/07/2021 Name:  Kristen Whitehead MRN:  [id number] DOB:  30-Nov-1922  Summary: Patient is weighing daily. Per patient the most she gains is 2 pounds.  Patient is taking her diuretic as per ordered. Patient is on a low sodium celiac diet. Her CHF is controlled welled. Patient has a history of falls but no recent injuries.  Recommendations/Changes made from today's visit: Patient will call Mom's meals and Meals on wheels to see if they meet her needs RN referred for assistance with Eliquis Patient will continue to monitor sodium in diet   Subjective: Kristen Whitehead is an 87 y.o. year old female who is a primary patient of Fargo, Amy E, NP. The care management team was consulted for assistance with care management and/or care coordination needs.    RN Health Coach completed Telephone Visit today.   Objective:  Medications Reviewed Today     Reviewed by Verlin Grills, RN (Case Manager) on 08/07/21 at 1108  Med List Status: <None>   Medication Order Taking? Sig Documenting Provider Last Dose Status Informant  acetaminophen (TYLENOL) 500 MG tablet [id number] Yes Take 1 tablet (500 mg total) by mouth every 8 (eight) hours as needed for moderate pain. Ngetich, Dinah C, NP Taking Active   amLODipine (NORVASC) 2.5 MG tablet [id number] Yes Take 2.5 mg by mouth at bedtime. [provider] Taking Active Self  atenolol (TENORMIN) 50 MG tablet [id number] Yes TAKE 1 TABLET BY MOUTH  TWICE DAILY [name], MD Taking Active   Calcium Carbonate-Vitamin D (CALCIUM 600+D PO) [id number] Yes Take 1 tablet by mouth 2 (two) times daily. [provider] Taking Active Self  Cholecalciferol (VITAMIN D) 2000 UNITS tablet [id number]  Take 4,000 Units by mouth daily. [provider]  Active Self  dorzolamide (TRUSOPT) 2 % ophthalmic solution [id number] Yes Place 1 drop into the left eye 2 (two) times  daily. [provider] Taking Active Self  ELIQUIS 2.5 MG TABS tablet [id number] Yes TAKE 1 TABLET BY MOUTH  TWICE DAILY [name], MD Taking Active   furosemide (LASIX) 40 MG tablet [id number] Yes Take 1 tablet (40 mg total) by mouth 2 (two) times daily. May take an additional 40 mg as needed for swelling or weight gain [name], MD Taking Active   isosorbide mononitrate (IMDUR) 30 MG 24 hr tablet [id number] Yes TAKE 1 TABLET BY MOUTH  DAILY [name], MD Taking Active   latanoprost (XALATAN) 0.005 % ophthalmic solution [id number] Yes Place 1 drop into both eyes at bedtime.  [provider] Taking Active Self  levothyroxine (SYNTHROID, LEVOTHROID) 100 MCG tablet [id number] Yes Take 100 mcg by mouth daily.  [provider] Taking Active Self           Med Note Olena Heckle, MICHELE   Mon Nov 18, 2016 10:47 AM)    Loperamide HCl (IMODIUM PO) [id number] Yes Take 1 capsule by mouth 2 (two) times daily as needed. [provider] Taking Active   MAGNESIUM OXIDE 400 PO [id number] Yes Take 800 mg by mouth daily. [provider] Taking Active Self  Multiple Vitamins-Minerals (CENTRUM SILVER PO) [id number] Yes Take 1 tablet by mouth daily. [provider] Taking Active Self  Multiple Vitamins-Minerals (PRESERVISION AREDS 2 PO) [id number]  Take 1 capsule by mouth 2 (two) times daily.  [provider]  Active Self  nitroGLYCERIN (NITROSTAT) 0.4 MG SL tablet [id number] Yes DISSOLVE 1 TABLET UNDER THE TONGUE  EVERY 5 MINUTES FOR 3 DOSES AS NEEDED  FOR  CHEST  PAIN [name], MD Taking Active Self  potassium chloride (KLOR-CON) 10 MEQ tablet [id number] Yes Take 10 mEq by mouth daily. [provider] Taking Active   PREMARIN vaginal cream [id number] Yes Place 2 g vaginally 2 (two) times a week. Uses on Sunday and wednesday [provider] Taking Active Self  Probiotic Product (PROBIOTIC DAILY PO) [id number] Yes Take 1  tablet by mouth daily. [provider] Taking Active Self  sodium chloride (MURO 128) 5 % ophthalmic solution [id number] Yes Place 1 drop into the right eye at bedtime.  [provider] Taking Active Self  temazepam (RESTORIL) 15 MG capsule [id number] Yes TAKE 1 CAPSULE(15 MG TOTAL) BY MOUTH AT BEDTIME. [name], NP Taking Active   Med List Note Damita Dunnings, Maci D, CPhT 12/10/19 1739): No preferred pharmacy              SDOH:  (Social Determinants of Health) assessments and interventions performed:  SDOH Interventions    Flowsheet Row Most Recent Value  SDOH Interventions   Food Insecurity Interventions Intervention Not Indicated  Housing Interventions Intervention Not Indicated  Intimate Partner Violence Interventions Intervention Not Indicated  Transportation Interventions Intervention Not Indicated       Care Plan  Review of patient past medical history, allergies, medications, health status, including review of consultants reports, laboratory and other test data, was performed as part of comprehensive evaluation for care management services.   Care Plan : Heart Failure (Adult)  Updates made by Verlin Grills, RN since 08/07/2021 12:00 AM     Problem: Symptom Exacerbation (Heart Failure) Resolved 08/07/2021  Priority: Medium  Note:   [id number] Resolving due to duplicate goal     Long-Range Goal: Symptom Exacerbation Prevented or Minimized Completed 08/07/2021  Start Date: 09/12/2020  Expected End Date: 09/18/2021  Recent Progress: On track  Priority: Medium  Note:   Evidence-based guidance:  Perform or review cognitive and/or health literacy screening.  Assess understanding of adherence and barriers to treatment plan, as well as lifestyle changes; develop strategies to address barriers.  Establish a mutually-agreed-upon early intervention process to communicate with primary care provider when signs/symptoms worsen.  Facilitate timely  posthospital discharge or emergency department treatment that includes intensive follow-up via telephone calls, home visit, telehealth monitoring and care at multidisciplinary heart failure clinic.  Adjust frequency and intensity of follow-up based on presentation, number of emergency department visits, hospital admissions and frequency and severity of symptom exacerbation.  Facilitate timely visit, usually within 1 week, with primary care provider following hospital discharge.  Collaborate with clinical pharmacist to address adverse drug reactions, drug interactions, subtherapeutic dosage, patient and family education.  Regularly screen for presence of depressive symptoms using a validated tool; consider pharmacologic therapy and/or referral for cognitive behavioral therapy when present.  Refer to community-based services, such as a heart failure support group, community Economist or peer support program.  Review immunization status; arrange receipt of needed vaccinations.  Prepare patient for home oxygen use based on signs/symptoms.   Notes:     Task: Identify and Minimize Risk of Heart Failure Exacerbation Completed 08/07/2021  Due Date: 09/18/2021  Priority: Routine  Note:   Care Management Activities:    - barriers to treatment reviewed and addressed - rescue (action) plan reviewed - self-awareness of signs/symptoms of worsening disease encouraged    Notes:  10/13- Pt report no issues with her HF with no swelling or  acute signs or symptoms. Pt remains in the GREEN zone with ongoing adherence to her diet an activities. Will continue to encouraged adherence with all goals and interventions. 7/28-Pt continue to report no acute issues with her HF and she remains in the GREEN zone. Pt states she is losing some weight but remains stable to her HF with no exacerbated episodes. 4/21-Pt continues to do well in monitoring her daily weights today 124 lbs however was elevated earlier in the week  to 129 lbs as pt has permission to take extra medications (Lasix) to reduce retention. Pt doing much better now no residual affects. Will continue to encouraged adherence. 2/25- Discussed all barriers and verified rescue plan for her ongoing HF management. Denies any signs/symptoms related to her HF management.    Problem: Disease Progression (Heart Failure) Resolved 08/07/2021  Priority: Medium  Note:   [id number] Resolving due to duplicate goal     Care Plan : Hypertension (Adult)  Updates made by Verlin Grills, RN since 08/07/2021 12:00 AM     Problem: Disease Progression (Hypertension) Resolved 08/07/2021  Priority: High  Note:   [id number] Resolving due to duplicate goal     Goal: Disease Progression Prevented or Minimized Completed 08/07/2021  Start Date: 03/15/2021  Expected End Date: 10/16/2021  Recent Progress: On track  Priority: High  Note:   Evidence-based guidance:  Tailor lifestyle advice to individual; review progress regularly; give frequent encouragement and respond positively to incremental successes.  Assess for and promote awareness of worsening disease or development of comorbidity.  Prepare patient for laboratory and diagnostic exams based on risk and presentation.  Prepare patient for use of pharmacologic therapy that may include diuretic, beta-blocker, beta-blocker/thiazide combination, angiotensin-converting enzyme inhibitor, renin-angiotensin blocker or calcium-channel blocker.  Expect periodic adjustments to pharmacologic therapy; manage side effects.  Promote a healthy diet that includes primarily plant-based foods, such as fruits, vegetables, whole grains, beans and legumes, low-fat dairy and lean meats.   Consider moderate reduction in sodium intake by avoiding the addition of salt to prepared foods and limiting processed meats, canned soup, frozen meals and salty snacks.   Promote a regular, daily exercise goal of 150 minutes per week of moderate  exercise based on tolerance, ability and patient choice; consider referral to physical therapist, community wellness and/or activity program.  Encourage the avoidance of no more than 2 hours per day of sedentary activity, such as recreational screen time.  Review sources of stress; explore current coping strategies and encourage use of mindfulness, yoga, meditation or exercise to manage stress.   Notes:     Task: Alleviate Barriers to Hypertension Treatment Completed 08/07/2021  Due Date: 10/16/2021  Priority: Routine  Note:   Care Management Activities:    - healthy diet promoted - healthy family lifestyle promoted - medication side effects managed - patient response to treatment assessed - reduction of dietary sodium encouraged - reduction in sedentary activities encouraged - response to pharmacologic therapy monitored    Notes:  11/14-Pt reports she continue to do well with baseline blood pressures. Pt unfortunately continue to experience ongoing stressors that are unavoidable at this time. Pt denies any need for counseling and depends on her spiritual. Pt reports she gets friends/family assist  with ride to all her appointments and errands.  10/13-RN inquired on pt's management of care. Discussed managing her stress levels with divisional activities or other distractions to assist with reducing her stress that may decrease her blood pressures.  9/8-Pt reports following with Dr.  O'Neal who removed one medication Imdur and informed pt to take her Norvasc at night before bedtime. Pt states her BP are much better with additional symptoms and she will follow up with this provider in Nov.Pt aware of who to call with any additional issues.  7/28- Encourage pt to follow prescribed recommendations related to lowering her blood pressure by her new provider Dr. Audie Box (CAD) Pt will follow up accordingly. Pt states her BP was to low and she was very weak. Initially suggested to take on one of her BP  medication at night however pt felt she needed further intervention and changed CAD providers. Pt states both communicated and changes were made to accommodate her blood pressures.    Care Plan : RN Care Manager Plan of Care  Updates made by Lelon Ikard, [name], RN since 08/07/2021 12:00 AM     Problem: Knowledge Deficit Related to CongestiveHeart Failure and Care Coordination Needs   Priority: High     Long-Range Goal: Patient-Specific Goal   Start Date: 08/07/2021  Expected End Date: 08/17/2022  Priority: High  Note:   Current Barriers:  Knowledge Deficits related to plan of care for management of CHF   RNCM Clinical Goal(s):  Patient will verbalize understanding of plan for management of CHF as evidenced by continuation of monitoring weight and adhering to 2300 mg low sodium diet  through collaboration with RN Care manager, provider, and care team.   Interventions: Inter-disciplinary care team collaboration (see longitudinal plan of care) Evaluation of current treatment plan related to  self management and patient's adherence to plan as established by provider   Patient Goals/Self-Care Activities: Take medications as prescribed   Attend all scheduled provider appointments Call pharmacy for medication refills 3-7 days in advance of running out of medications Attend church or other social activities Perform all self care activities independently  Perform IADL's (shopping, preparing meals, housekeeping, managing finances) independently Call provider office for new concerns or questions  call the Suicide and Crisis Lifeline: 988 if experiencing a Mental Health or [place]  call office if I gain more than 2 pounds in one day or 5 pounds in one week use salt in moderation watch for swelling in feet, ankles and legs every day weigh myself daily follow rescue plan if symptoms flare-up know when to call the doctorto prevent CHF exacerbation track symptoms and what  helps feel better or worse dress right for the weather, hot or cold        Plan: Telephone follow up appointment with care management team member scheduled for:  March 2023 The patient has been provided with contact information for the care management team and has been advised to call with any health related questions or concerns.  Referred to [facility] [phone]

## 2021-08-07 NOTE — Patient Instructions (Signed)
Visit Information  Thank you for taking time to visit with me today. Please don't hesitate to contact me if I can be of assistance to you before our next scheduled telephone appointment.  Following are the goals we discussed today:  Current Barriers:  Knowledge Deficits related to plan of care for management of CHF   RNCM Clinical Goal(s):  Patient will verbalize understanding of plan for management of CHF as evidenced by continuation of monitoring weight and adhering to 2300 mg low sodium diet through collaboration with RN Care manager, provider, and care team.   Interventions: Inter-disciplinary care team collaboration (see longitudinal plan of care) Evaluation of current treatment plan related to  self management and patient's adherence to plan as established by provider   Patient Goals/Self-Care Activities: Take medications as prescribed   Attend all scheduled provider appointments Call pharmacy for medication refills 3-7 days in advance of running out of medications Attend church or other social activities Perform all self care activities independently  Perform IADL's (shopping, preparing meals, housekeeping, managing finances) independently Call provider office for new concerns or questions  call the Suicide and Crisis Lifeline: 988 if experiencing a Mental Health or [place]  call office if I gain more than 2 pounds in one day or 5 pounds in one week use salt in moderation watch for swelling in feet, ankles and legs every day weigh myself daily follow rescue plan if symptoms flare-up know when to call the doctorto prevent CHF exacerbation track symptoms and what helps feel better or worse dress right for the weather, hot or cold Contact meals on wheels and Mom's meals    Our next appointment is by telephone on March 2022  Please call the care guide team at [phone] if you need to cancel or reschedule your appointment.   Please call the Suicide and  Crisis Lifeline: 988 if you are experiencing a Mental Health or [place] or need someone to talk to.  The patient verbalized understanding of instructions, educational materials, and care plan provided today and agreed to receive a mailed copy of patient instructions, educational materials, and care plan.   Telephone follow up appointment with care management team member scheduled for: The patient has been provided with contact information for the care management team and has been advised to call with any health related questions or concerns.   [facility] [phone]

## 2021-08-16 ENCOUNTER — Other Ambulatory Visit: Payer: Self-pay

## 2021-08-16 ENCOUNTER — Telehealth (INDEPENDENT_AMBULATORY_CARE_PROVIDER_SITE_OTHER): Payer: Medicare Other | Admitting: Nurse Practitioner

## 2021-08-16 ENCOUNTER — Telehealth: Payer: Self-pay | Admitting: Cardiovascular Disease

## 2021-08-16 ENCOUNTER — Ambulatory Visit: Payer: Medicare Other | Admitting: Orthopedic Surgery

## 2021-08-16 DIAGNOSIS — R0602 Shortness of breath: Secondary | ICD-10-CM

## 2021-08-16 DIAGNOSIS — K219 Gastro-esophageal reflux disease without esophagitis: Secondary | ICD-10-CM | POA: Diagnosis not present

## 2021-08-16 DIAGNOSIS — R059 Cough, unspecified: Secondary | ICD-10-CM

## 2021-08-16 MED ORDER — OMEPRAZOLE 20 MG PO CPDR: 20.0000 mg | DELAYED_RELEASE_CAPSULE | Freq: Every day | ORAL | 3 refills | Status: DC

## 2021-08-16 NOTE — Telephone Encounter (Signed)
Spoke to patient. She states she had a hard time breathing last night "could not breathe" .  No heaviness, no congestion.  No pain "Have not been sick or around someone sick". She states she only slept about 2 hours last night. No swelling noted blood pressure this morning 122/75 pulse 74. Patient states she checks vital signs twice a day  - in the morning and at bedtime. She states number have been good , but did not give information except this morning vitals.   She states she was in [place]  last week was rushing to  leave before the cold snap -  and lot activity due to holidays.   Patient states she is not having issues today.   She states she hs been colder than usually,but does not have temp.  but she has not taken temp.either.  patient does state about 2 days ago felt little confused and  could not remember. She states she was aware that she could not remember certain things - like the name of her bank.  RN   informed her symptoms  were vague. Recommend she contact her primary . She has upcoming appointment 08/23/21.she may be able to move the appointment up.  She can be evaluated if cardiac may contact office back. Patient voiced understanding and states she will contact A. Fargo NP.

## 2021-08-16 NOTE — Progress Notes (Signed)
This service is provided via telemedicine  No vital signs collected/recorded due to the encounter was a telemedicine visit.   Location of patient (ex: home, work):  Home  Patient consents to a telephone visit:  Yes, see encounter dated 06/14/2021  Location of the provider (ex: office, home):  Home  Name of any referring provider:  [name], NP  Names of all persons participating in the telemedicine service and their role in the encounter:  Sherrie Mustache, Nurse Practitioner, Carroll Kinds, CMA, and patient.   Time spent on call:  14 minutes with medical assistant

## 2021-08-16 NOTE — Telephone Encounter (Signed)
Pt c/o Shortness Of Breath: STAT if SOB developed within the last 24 hours or pt is noticeably SOB on the phone  1. Are you currently SOB (can you hear that pt is SOB on the phone)? NOT RIGHT NOW  2. How long have you been experiencing SOB? 2-3 DAYS  3. Are you SOB when sitting or when up moving around? MOVING AROUND  4. Are you currently experiencing any other symptoms? PT SAID SHE FEELS BAD BUT SHE PRAYED ABOUT IT LAST NIGHT AND SHE IS FEELING A LITTLE BETTER. PT WANTS TO KNOW IF SHE SHOULD EXERCISE TO GET HER HEART "REVED UP"!  PT HAS BEEN EXPERIENCING FORGETFULNESS, SHE CANT REMEMBER THE NAME OF HER BANK.  PT WANTS TO KNOW IF SHE IS EXPERIENCING HEART FAILURE OR DOES HER MEDS NEED TO BE CHANGED.

## 2021-08-16 NOTE — Progress Notes (Signed)
Careteam: Patient Care Team: [name], NP as PCP - General (Adult Health Nurse Practitioner) [name], MD as PCP - Cardiology (Cardiology) Pleasant, [name], RN as [place] Management  Advanced Directive information    Allergies  Allergen Reactions   Azithromycin Nausea Only and Other (See Comments)   Compazine [Prochlorperazine Edisylate] Nausea Only   Demerol Nausea Only   Dolophine [Methadone] Nausea Only   Ebastine Nausea Only    EBS   Erythromycin Nausea Only and Other (See Comments)   Ibuprofen Nausea Only and Other (See Comments)   Meperidine Hcl Other (See Comments)   Morphine Sulfate Other (See Comments)   Statins Other (See Comments)    myalgias   Tetanus Toxoids Nausea Only   Tetracycline Other (See Comments)   Tetracyclines & Related Nausea Only    Chief Complaint  Patient presents with   Acute Visit    Patient having shortness of breath and weakness. Patient had shortness of breath since last night. She has been weak for several days. Patient also has cough. She has lack of energy. Patient has not been around anyone that has COVID (that she know of). [place] She was around family while there. She took COVID test before going. She has not done another test.     HPI: Patient is a 87 y.o. female via telephone visit.  Pt with hx of CHF, a fib, hypothyroid, htn.  She had a big christmas and was well. Yesterday she started coughing. She has been dragging.  No one was sick during her stay. No runny nose, nasal congestion, chest congestion.   Last night could not breath. She reports she was fighting for breaths.  No palpitations.  O2 90 % this morning and O2 this afternoon 96% 122/75 blood pressure 74 HR  She used to be on omeprazole 40 mg due to cough but had stopped due to being on it long term.  She stopped 3 months ago. Never has had reflux but has been queasy.   She took benzonatate  yesterday which helped yesterday but not today.   Review of Systems:  Review of Systems  Constitutional:  Positive for malaise/fatigue. Negative for chills, fever and weight loss.  HENT:  Negative for tinnitus.   Respiratory:  Positive for cough. Negative for sputum production and shortness of breath.   Cardiovascular:  Negative for chest pain, palpitations and leg swelling.  Gastrointestinal:  Negative for abdominal pain, constipation, diarrhea and heartburn.  Genitourinary:  Negative for dysuria, frequency and urgency.  Musculoskeletal:  Negative for back pain, falls, joint pain and myalgias.  Skin: Negative.   Neurological:  Negative for dizziness and headaches.  Psychiatric/Behavioral:  Negative for depression.    Past Medical History:  Diagnosis Date   Bladder prolapse, female, acquired    Cancer [facility])    thyroid   Chest pain    no known ischemic heart disease; negative Myoview July 2013. EF 75% with no ischemia.    Chronic anticoagulation    Chronic atrial fibrillation (HCC)    managed with rate control and coumadin   Chronic diastolic heart failure (HCC)    GERD (gastroesophageal reflux disease)    Heart disease    History of congenital mitral regurgitation    mild   History of heart attack    Multiple    History of mitral valve prolapse 06/15/2008   a. echo 1/14: mild LVH, EF 60-65%, mild MR, mild to  mod BAE, PASP 35   Hypercholesterolemia    Hypertension    Hypothyroidism    Past Surgical History:  Procedure Laterality Date   ABDOMINAL HYSTERECTOMY     CATARACT EXTRACTION     CHOLECYSTECTOMY     Thyroidectomy     TONSILLECTOMY     Social History:   reports that she has never smoked. She has never used smokeless tobacco. She reports that she does not drink alcohol and does not use drugs.  Family History  Problem Relation Age of Onset   Stroke Mother 40   Stroke Father 53   Heart attack Neg Hx    Heart disease Neg Hx     Medications: Patient's  Medications  New Prescriptions   No medications on file  Previous Medications   ACETAMINOPHEN (TYLENOL) 500 MG TABLET    Take 1 tablet (500 mg total) by mouth every 8 (eight) hours as needed for moderate pain.   AMLODIPINE (NORVASC) 2.5 MG TABLET    Take 2.5 mg by mouth at bedtime.   ATENOLOL (TENORMIN) 50 MG TABLET    TAKE 1 TABLET BY MOUTH  TWICE DAILY   CALCIUM CARBONATE-VITAMIN D (CALCIUM 600+D PO)    Take 1 tablet by mouth in the morning and at bedtime.   CHOLECALCIFEROL (VITAMIN D) 2000 UNITS TABLET    Take 4,000 Units by mouth daily.   DORZOLAMIDE (TRUSOPT) 2 % OPHTHALMIC SOLUTION    Place 1 drop into the left eye 2 (two) times daily.   ELIQUIS 2.5 MG TABS TABLET    TAKE 1 TABLET BY MOUTH  TWICE DAILY   FUROSEMIDE (LASIX) 40 MG TABLET    Take 1 tablet (40 mg total) by mouth 2 (two) times daily. May take an additional 40 mg as needed for swelling or weight gain   ISOSORBIDE MONONITRATE (IMDUR) 30 MG 24 HR TABLET    TAKE 1 TABLET BY MOUTH  DAILY   LATANOPROST (XALATAN) 0.005 % OPHTHALMIC SOLUTION    Place 1 drop into both eyes at bedtime.    LEVOTHYROXINE (SYNTHROID, LEVOTHROID) 100 MCG TABLET    Take 100 mcg by mouth daily.    LOPERAMIDE HCL (IMODIUM PO)    Take 1 capsule by mouth 2 (two) times daily as needed.   MAGNESIUM OXIDE 400 PO    Take 800 mg by mouth daily.   MULTIPLE VITAMINS-MINERALS (CENTRUM SILVER PO)    Take 1 tablet by mouth daily.   MULTIPLE VITAMINS-MINERALS (PRESERVISION AREDS 2 PO)    Take 1 capsule by mouth 2 (two) times daily.    NITROGLYCERIN (NITROSTAT) 0.4 MG SL TABLET    DISSOLVE 1 TABLET UNDER THE TONGUE EVERY 5 MINUTES FOR 3 DOSES AS NEEDED  FOR  CHEST  PAIN   POTASSIUM CHLORIDE (KLOR-CON) 10 MEQ TABLET    Take 10 mEq by mouth daily.   PREMARIN VAGINAL CREAM    Place 2 g vaginally 2 (two) times a week. Uses on Sunday and wednesday   PROBIOTIC PRODUCT (PROBIOTIC DAILY PO)    Take 1 tablet by mouth daily.   SODIUM CHLORIDE (MURO 128) 5 % OPHTHALMIC SOLUTION     Place 1 drop into the right eye at bedtime.    TEMAZEPAM (RESTORIL) 15 MG CAPSULE    TAKE 1 CAPSULE(15 MG TOTAL) BY MOUTH AT BEDTIME.  Modified Medications   No medications on file  Discontinued Medications   No medications on file    Physical Exam:  There were no vitals filed for  this visit. There is no height or weight on file to calculate BMI. Wt Readings from Last 3 Encounters:  07/10/21 122 lb 6.4 oz (55.5 kg)  05/14/21 123 lb (55.8 kg)  04/12/21 123 lb 12.8 oz (56.2 kg)    Physical Exam- cough noted but able to speak in full sentences without increase work of breathing. Alert and appropriate   Labs reviewed: Basic Metabolic Panel: Recent Labs    11/23/20 1417 04/12/21 1219  NA 138 135  K 4.1 4.0  CL 100 96*  CO2 28 31  GLUCOSE 92 83  BUN 12 10  CREATININE 0.77 0.72  CALCIUM 8.5* 8.5*  TSH  --  0.41   Liver Function Tests: Recent Labs    04/12/21 1219  AST 19  ALT 12  BILITOT 0.8  PROT 6.6   No results for input(s): LIPASE, AMYLASE in the last 8760 hours. No results for input(s): AMMONIA in the last 8760 hours. CBC: Recent Labs    11/23/20 1417 04/12/21 1219  WBC 5.7 5.2  NEUTROABS 2,457 2,246  HGB 15.3 14.1  HCT 46.4* 42.1  MCV 93.5 96.1  PLT 246 268   Lipid Panel: No results for input(s): CHOL, HDL, LDLCALC, TRIG, CHOLHDL, LDLDIRECT in the last 8760 hours. TSH: Recent Labs    04/12/21 1219  TSH 0.41   A1C: No results found for: HGBA1C   Assessment/Plan 1. Cough, unspecified type -ongoing cough without congestion or feeling bad -hx of cough in the past related to GERD and improved on omeprazole. She has stopped at this time. We discussed restarting medication at this time.    2. Gastroesophageal reflux disease without esophagitis -will restart omeprazole, she has 40 mg by mouth daily so does not need refill, discussed sending in 20 mg daily for next fill to reduce dose to see if she gets same effect.   3. Shortness of breath -has  resolved at this time. No signs of acute CHF. She will follow up in person if this happens again.   Carlos American. Harle Battiest  [facility] [phone]    Virtual Visit via telephone- no access to smartphone or computer.   I connected with patient on 08/16/21 at  3:45 PM EST by telephone and verified that I am speaking with the correct person using two identifiers.  Location: Patient: home Provider: home-remote   I discussed the limitations, risks, security and privacy concerns of performing an evaluation and management service by telephone and the availability of in person appointments. I also discussed with the patient that there may be a patient responsible charge related to this service. The patient expressed understanding and agreed to proceed.   I discussed the assessment and treatment plan with the patient. The patient was provided an opportunity to ask questions and all were answered. The patient agreed with the plan and demonstrated an understanding of the instructions.   The patient was advised to call back or seek an in-person evaluation if the symptoms worsen or if the condition fails to improve as anticipated.  I provided 18 minutes of non-face-to-face time during this encounter.  Carlos American. Harle Battiest Avs printed and mailed

## 2021-08-19 DIAGNOSIS — R062 Wheezing: Secondary | ICD-10-CM | POA: Diagnosis not present

## 2021-08-19 DIAGNOSIS — J209 Acute bronchitis, unspecified: Secondary | ICD-10-CM | POA: Diagnosis not present

## 2021-08-19 DIAGNOSIS — R051 Acute cough: Secondary | ICD-10-CM | POA: Diagnosis not present

## 2021-08-23 ENCOUNTER — Ambulatory Visit (INDEPENDENT_AMBULATORY_CARE_PROVIDER_SITE_OTHER): Payer: Medicare Other | Admitting: Orthopedic Surgery

## 2021-08-23 ENCOUNTER — Other Ambulatory Visit: Payer: Self-pay

## 2021-08-23 ENCOUNTER — Ambulatory Visit: Payer: Medicare Other | Admitting: Orthopedic Surgery

## 2021-08-23 ENCOUNTER — Encounter: Payer: Self-pay | Admitting: Orthopedic Surgery

## 2021-08-23 VITALS — BP 128/82 | HR 72 | Temp 97.5°F | Ht 59.0 in | Wt 125.6 lb

## 2021-08-23 DIAGNOSIS — I5032 Chronic diastolic (congestive) heart failure: Secondary | ICD-10-CM | POA: Diagnosis not present

## 2021-08-23 DIAGNOSIS — E890 Postprocedural hypothyroidism: Secondary | ICD-10-CM

## 2021-08-23 DIAGNOSIS — G4709 Other insomnia: Secondary | ICD-10-CM

## 2021-08-23 DIAGNOSIS — I10 Essential (primary) hypertension: Secondary | ICD-10-CM

## 2021-08-23 DIAGNOSIS — R059 Cough, unspecified: Secondary | ICD-10-CM | POA: Diagnosis not present

## 2021-08-23 DIAGNOSIS — I4821 Permanent atrial fibrillation: Secondary | ICD-10-CM | POA: Diagnosis not present

## 2021-08-23 DIAGNOSIS — H3530 Unspecified macular degeneration: Secondary | ICD-10-CM | POA: Diagnosis not present

## 2021-08-23 DIAGNOSIS — K219 Gastro-esophageal reflux disease without esophagitis: Secondary | ICD-10-CM

## 2021-08-23 LAB — CBC WITH DIFFERENTIAL/PLATELET
Absolute Monocytes: 551 cells/uL (ref 200–950)
Basophils Absolute: 22 cells/uL (ref 0–200)
Basophils Relative: 0.4 %
Eosinophils Absolute: 151 cells/uL (ref 15–500)
Eosinophils Relative: 2.8 %
HCT: 43.3 % (ref 35.0–45.0)
Hemoglobin: 14.1 g/dL (ref 11.7–15.5)
Lymphs Abs: 2041 cells/uL (ref 850–3900)
MCH: 31.1 pg (ref 27.0–33.0)
MCHC: 32.6 g/dL (ref 32.0–36.0)
MCV: 95.4 fL (ref 80.0–100.0)
MPV: 9.5 fL (ref 7.5–12.5)
Monocytes Relative: 10.2 %
Neutro Abs: 2635 cells/uL (ref 1500–7800)
Neutrophils Relative %: 48.8 %
Platelets: 253 10*3/uL (ref 140–400)
RBC: 4.54 10*6/uL (ref 3.80–5.10)
RDW: 13.3 % (ref 11.0–15.0)
Total Lymphocyte: 37.8 %
WBC: 5.4 10*3/uL (ref 3.8–10.8)

## 2021-08-23 LAB — COMPLETE METABOLIC PANEL WITH GFR
AG Ratio: 1.3 (calc) (ref 1.0–2.5)
ALT: 14 U/L (ref 6–29)
AST: 22 U/L (ref 10–35)
Albumin: 3.7 g/dL (ref 3.6–5.1)
Alkaline phosphatase (APISO): 94 U/L (ref 37–153)
BUN: 12 mg/dL (ref 7–25)
CO2: 31 mmol/L (ref 20–32)
Calcium: 9 mg/dL (ref 8.6–10.4)
Chloride: 100 mmol/L (ref 98–110)
Creat: 0.78 mg/dL (ref 0.60–0.95)
Globulin: 2.8 g/dL (calc) (ref 1.9–3.7)
Glucose, Bld: 85 mg/dL (ref 65–139)
Potassium: 4.1 mmol/L (ref 3.5–5.3)
Sodium: 139 mmol/L (ref 135–146)
Total Bilirubin: 0.6 mg/dL (ref 0.2–1.2)
Total Protein: 6.5 g/dL (ref 6.1–8.1)
eGFR: 69 mL/min/{1.73_m2} (ref >=60)

## 2021-08-23 NOTE — Patient Instructions (Signed)
Drink more water 

## 2021-08-23 NOTE — Progress Notes (Signed)
Careteam: Patient Care Team: [name], NP as PCP - General (Adult Health Nurse Practitioner) [name], MD as PCP - Cardiology (Cardiology) Pleasant, [name], RN as [place] Management  Seen by: [name], AGNP-C  PLACE OF SERVICE:  [place]  Advanced Directive information    Allergies  Allergen Reactions   Azithromycin Nausea Only and Other (See Comments)   Compazine [Prochlorperazine Edisylate] Nausea Only   Demerol Nausea Only   Dolophine [Methadone] Nausea Only   Ebastine Nausea Only    EBS   Erythromycin Nausea Only and Other (See Comments)   Ibuprofen Nausea Only and Other (See Comments)   Meperidine Hcl Other (See Comments)   Morphine Sulfate Other (See Comments)   Statins Other (See Comments)    myalgias   Tetanus Toxoids Nausea Only   Tetracycline Other (See Comments)   Tetracyclines & Related Nausea Only    No chief complaint on file.   HPI: Patient is a 88 y.o. female seen today for medical management of chronic conditions.   Son present during encounter.  Diagnosed with acute bronchitis 01/01. CXR unremarkable. She was given amoxicillin x 10 days and albuterol. She only took amoxicillin for a few days. Reports improved symptoms today. Denies productive cough.   PAF- HR controlled with atenolol. Remains on Eliquis for clot prevention. Denies sign of bleeding. No recent falls.   CHF- continues to weight herself daily. Admits to eating some holiday foods and weight gain of a few pounds. Denies increased sob or ankle edema.   Followed by Dr. Posey Pronto for macular degeneration. 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 She started injections last month. No changes in vision today.   Yearly dental exam 08/30/2020. Denies dental pain.   Eating 3 meals daily. Admits to not drinking water well.   No recent falls.   She hosts bible study and card game every week.   Does not want tetanus or Shingrix vaccine due to advanced  age.      Review of Systems:  Review of Systems  Constitutional:  Negative for chills, fever, malaise/fatigue and weight loss.  HENT:  Negative for hearing loss and sore throat.   Eyes:  Negative for blurred vision and double vision.  Respiratory:  Positive for cough, shortness of breath and wheezing.   Cardiovascular:  Positive for leg swelling. Negative for chest pain.  Gastrointestinal:  Negative for abdominal pain, blood in stool, constipation, diarrhea, heartburn, nausea and vomiting.  Genitourinary:  Positive for frequency. Negative for dysuria and hematuria.  Musculoskeletal:  Negative for falls, joint pain and myalgias.  Skin:  Negative for rash.  Neurological:  Negative for dizziness, weakness and headaches.  Psychiatric/Behavioral:  Negative for depression and memory loss. The patient is not nervous/anxious and does not have insomnia.    Past Medical History:  Diagnosis Date   Bladder prolapse, female, acquired    Cancer [facility])    thyroid   Chest pain    no known ischemic heart disease; negative Myoview July 2013. EF 75% with no ischemia.    Chronic anticoagulation    Chronic atrial fibrillation (HCC)    managed with rate control and coumadin   Chronic diastolic heart failure (HCC)    GERD (gastroesophageal reflux disease)    Heart disease    History of congenital mitral regurgitation    mild   History of heart attack    Multiple    History of mitral valve prolapse 06/15/2008  a. echo 1/14: mild LVH, EF 60-65%, mild MR, mild to mod BAE, PASP 35   Hypercholesterolemia    Hypertension    Hypothyroidism    Past Surgical History:  Procedure Laterality Date   ABDOMINAL HYSTERECTOMY     CATARACT EXTRACTION     CHOLECYSTECTOMY     Thyroidectomy     TONSILLECTOMY     Social History:   reports that she has never smoked. She has never used smokeless tobacco. She reports that she does not drink alcohol and does not use drugs.  Family History  Problem Relation Age  of Onset   Stroke Mother 53   Stroke Father 25   Heart attack Neg Hx    Heart disease Neg Hx     Medications: Patient's Medications  New Prescriptions   No medications on file  Previous Medications   ACETAMINOPHEN (TYLENOL) 500 MG TABLET    Take 1 tablet (500 mg total) by mouth every 8 (eight) hours as needed for moderate pain.   AMLODIPINE (NORVASC) 2.5 MG TABLET    Take 2.5 mg by mouth at bedtime.   ATENOLOL (TENORMIN) 50 MG TABLET    TAKE 1 TABLET BY MOUTH  TWICE DAILY   CALCIUM CARBONATE-VITAMIN D (CALCIUM 600+D PO)    Take 1 tablet by mouth in the morning and at bedtime.   CHOLECALCIFEROL (VITAMIN D) 2000 UNITS TABLET    Take 4,000 Units by mouth daily.   DORZOLAMIDE (TRUSOPT) 2 % OPHTHALMIC SOLUTION    Place 1 drop into the left eye 2 (two) times daily.   ELIQUIS 2.5 MG TABS TABLET    TAKE 1 TABLET BY MOUTH  TWICE DAILY   FUROSEMIDE (LASIX) 40 MG TABLET    Take 1 tablet (40 mg total) by mouth 2 (two) times daily. May take an additional 40 mg as needed for swelling or weight gain   ISOSORBIDE MONONITRATE (IMDUR) 30 MG 24 HR TABLET    TAKE 1 TABLET BY MOUTH  DAILY   LATANOPROST (XALATAN) 0.005 % OPHTHALMIC SOLUTION    Place 1 drop into both eyes at bedtime.    LEVOTHYROXINE (SYNTHROID, LEVOTHROID) 100 MCG TABLET    Take 100 mcg by mouth daily.    LOPERAMIDE HCL (IMODIUM PO)    Take 1 capsule by mouth 2 (two) times daily as needed.   MAGNESIUM OXIDE 400 PO    Take 800 mg by mouth daily.   MULTIPLE VITAMINS-MINERALS (CENTRUM SILVER PO)    Take 1 tablet by mouth daily.   MULTIPLE VITAMINS-MINERALS (PRESERVISION AREDS 2 PO)    Take 1 capsule by mouth 2 (two) times daily.    NITROGLYCERIN (NITROSTAT) 0.4 MG SL TABLET    DISSOLVE 1 TABLET UNDER THE TONGUE EVERY 5 MINUTES FOR 3 DOSES AS NEEDED  FOR  CHEST  PAIN   OMEPRAZOLE (PRILOSEC) 20 MG CAPSULE    Take 1 capsule (20 mg total) by mouth daily.   POTASSIUM CHLORIDE (KLOR-CON) 10 MEQ TABLET    Take 10 mEq by mouth daily.   PREMARIN VAGINAL  CREAM    Place 2 g vaginally 2 (two) times a week. Uses on Sunday and wednesday   PROBIOTIC PRODUCT (PROBIOTIC DAILY PO)    Take 1 tablet by mouth daily.   SODIUM CHLORIDE (MURO 128) 5 % OPHTHALMIC SOLUTION    Place 1 drop into the right eye at bedtime.    TEMAZEPAM (RESTORIL) 15 MG CAPSULE    TAKE 1 CAPSULE(15 MG TOTAL) BY MOUTH AT BEDTIME.  Modified Medications   No medications on file  Discontinued Medications   No medications on file    Physical Exam:  There were no vitals filed for this visit. There is no height or weight on file to calculate BMI. Wt Readings from Last 3 Encounters:  07/10/21 122 lb 6.4 oz (55.5 kg)  05/14/21 123 lb (55.8 kg)  04/12/21 123 lb 12.8 oz (56.2 kg)    Physical Exam Vitals reviewed.  Constitutional:      General: She is not in acute distress. HENT:     Head: Normocephalic.  Eyes:     General:        Right eye: No discharge.        Left eye: No discharge.  Neck:     Vascular: No carotid bruit.  Cardiovascular:     Rate and Rhythm: Normal rate. Rhythm irregular.     Pulses: Normal pulses.     Heart sounds: Normal heart sounds. No murmur heard. Pulmonary:     Effort: Pulmonary effort is normal. No respiratory distress.     Breath sounds: Normal breath sounds. No wheezing.  Abdominal:     General: Bowel sounds are normal. There is no distension.     Palpations: Abdomen is soft.     Tenderness: There is no abdominal tenderness.  Musculoskeletal:     Cervical back: Normal range of motion.     Right lower leg: Edema present.     Left lower leg: Edema present.     Comments: Non-pitting  Lymphadenopathy:     Cervical: No cervical adenopathy.  Skin:    General: Skin is warm and dry.     Capillary Refill: Capillary refill takes less than 2 seconds.  Neurological:     General: No focal deficit present.     Mental Status: She is alert and oriented to person, place, and time.     Motor: Weakness present.     Gait: Gait abnormal.      Comments: Cane with steps/ curb  Psychiatric:        Mood and Affect: Mood normal.        Behavior: Behavior normal.    Labs reviewed: Basic Metabolic Panel: Recent Labs    11/23/20 1417 04/12/21 1219  NA 138 135  K 4.1 4.0  CL 100 96*  CO2 28 31  GLUCOSE 92 83  BUN 12 10  CREATININE 0.77 0.72  CALCIUM 8.5* 8.5*  TSH  --  0.41   Liver Function Tests: Recent Labs    04/12/21 1219  AST 19  ALT 12  BILITOT 0.8  PROT 6.6   No results for input(s): LIPASE, AMYLASE in the last 8760 hours. No results for input(s): AMMONIA in the last 8760 hours. CBC: Recent Labs    11/23/20 1417 04/12/21 1219  WBC 5.7 5.2  NEUTROABS 2,457 2,246  HGB 15.3 14.1  HCT 46.4* 42.1  MCV 93.5 96.1  PLT 246 268   Lipid Panel: No results for input(s): CHOL, HDL, LDLCALC, TRIG, CHOLHDL, LDLDIRECT in the last 8760 hours. TSH: Recent Labs    04/12/21 1219  TSH 0.41   A1C: No results found for: HGBA1C   Assessment/Plan 1. Essential hypertension - controlled  - cont amlodipine and atenolol - CBC with Differential/Platelets - CMP with eGFR([facility])  2. Permanent atrial fibrillation ([place]) - followed by cardiology - HR controlled with atenolol - cont Eliquis for clot prevention  3. Chronic diastolic heart failure (HCC) - no recent weight fluctuations,  sob, ankle edema - cont furosemide  4. Cough, unspecified type - resolved  5. Gastroesophageal reflux disease without esophagitis - hgb 14.1 08/23/2020 - cont omeprazole  6. Other insomnia - stable with temazepam  7. Postoperative hypothyroidism - TSH 0.41 04/12/2021 - cont levothyroxine  8. Macular degeneration of both eyes, unspecified type - followed by Dr. 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 Greater than 50% of total time spent doing patient education regarding health maintenance, vaccinations, and medication management.    Next  appt: Visit date not found  [place] [phone]

## 2021-08-30 DIAGNOSIS — H353211 Exudative age-related macular degeneration, right eye, with active choroidal neovascularization: Secondary | ICD-10-CM | POA: Diagnosis not present

## 2021-09-08 ENCOUNTER — Other Ambulatory Visit: Payer: Self-pay | Admitting: Neurology

## 2021-09-27 ENCOUNTER — Other Ambulatory Visit: Payer: Self-pay | Admitting: Orthopedic Surgery

## 2021-09-27 ENCOUNTER — Telehealth: Payer: Self-pay | Admitting: *Deleted

## 2021-09-27 DIAGNOSIS — G2581 Restless legs syndrome: Secondary | ICD-10-CM

## 2021-09-27 MED ORDER — PRAMIPEXOLE DIHYDROCHLORIDE 0.125 MG PO TABS: 0.1250 mg | ORAL_TABLET | Freq: Every day | ORAL | 0 refills | Status: DC

## 2021-09-27 NOTE — Telephone Encounter (Signed)
Patient notified and agreed.  

## 2021-09-27 NOTE — Telephone Encounter (Signed)
Prescription sent to [facility]

## 2021-09-27 NOTE — Telephone Encounter (Signed)
Patient called and stated that she stopped taking Pramipexole 0.125mg  one qhs that her Neurologist gave her a few months ago.   Patient has started having crams in feet again and requesting to start back on it but wants to know if you would prescribed instead of her having to go back to his office when she doesn't drive.   Requesting you to prescribe Pramipexole 0.125mg  One QHS.  Please Advise.

## 2021-10-08 DIAGNOSIS — H353211 Exudative age-related macular degeneration, right eye, with active choroidal neovascularization: Secondary | ICD-10-CM | POA: Diagnosis not present

## 2021-10-15 DIAGNOSIS — H401131 Primary open-angle glaucoma, bilateral, mild stage: Secondary | ICD-10-CM | POA: Diagnosis not present

## 2021-10-15 DIAGNOSIS — H1820 Unspecified corneal edema: Secondary | ICD-10-CM | POA: Diagnosis not present

## 2021-10-15 DIAGNOSIS — H18423 Band keratopathy, bilateral: Secondary | ICD-10-CM | POA: Diagnosis not present

## 2021-10-15 DIAGNOSIS — H04123 Dry eye syndrome of bilateral lacrimal glands: Secondary | ICD-10-CM | POA: Diagnosis not present

## 2021-11-10 ENCOUNTER — Other Ambulatory Visit: Payer: Self-pay | Admitting: Cardiovascular Disease

## 2021-11-12 ENCOUNTER — Other Ambulatory Visit: Payer: Self-pay | Admitting: *Deleted

## 2021-11-12 DIAGNOSIS — H353211 Exudative age-related macular degeneration, right eye, with active choroidal neovascularization: Secondary | ICD-10-CM | POA: Diagnosis not present

## 2021-11-12 NOTE — Patient Outreach (Signed)
[facility]) Care Management ? ?11/12/2021 ? ?Kristen Whitehead ?[birth year] ?[phone] ? ?RN Health Coach attempted follow up outreach call to patient.  Patient was going to an appointment with eye Dr.Patient requested call back tomorrow.  ?Plan: ?RN will call patient 11/13/2021 ? ?Johny Shock BSN RN ?[place] Management ?[phone] ? ? ?

## 2021-11-12 NOTE — Telephone Encounter (Signed)
Prescription refill request for Eliquis received. ?Indication:Afib ?Last office visit:11/22 ?Scr:0.7 ?Age: 88 ?Weight:57 kg ? ?Prescription refilled ? ?

## 2021-11-13 ENCOUNTER — Other Ambulatory Visit: Payer: Self-pay | Admitting: *Deleted

## 2021-11-15 NOTE — Patient Outreach (Signed)
[facility]) Care Management RN Health Coach Note   11/15/2021 Name:  Kristen Whitehead MRN:  [id number] DOB:  02-25-1923  Summary: Patient monitors weight daily. Per patient she has gained a little weight due to eating a lot of candy.  She has been monitoring her sodium intake. Per patient she has been doing leg and hand exercises. Patient has been having constipation.   Recommendations/Changes made from today's visit: Increase exercise Increase fiber in diet Monitor daily weights  Monitor for symptoms of CHF Medication adherence Follow up on MOM's on meals service     Subjective: Kristen Whitehead is an 88 y.o. year old female who is a primary patient of [place], Amy E, NP. The care management team was consulted for assistance with care management and/or care coordination needs.    RN Health Coach completed Telephone Visit today.   Objective:  Medications Reviewed Today     Reviewed by Luella Cook, RN (Case Manager) on 11/13/21 at 1258  Med List Status: <None>   Medication Order Taking? Sig Documenting Provider Last Dose Status Informant  albuterol (VENTOLIN HFA) 108 (90 Base) MCG/ACT inhaler [id number] No SMARTSIG:2 Puff(s) By Mouth Every 6 Hours PRN  Patient not taking: Reported on 11/13/2021   [provider] Not Taking Active   amLODipine (NORVASC) 2.5 MG tablet [id number] Yes Take 2.5 mg by mouth at bedtime. [provider] Taking Active Self  atenolol (TENORMIN) 50 MG tablet [id number] Yes TAKE 1 TABLET BY MOUTH  TWICE DAILY O'Neal, Ronnald Ramp, MD Taking Active   Calcium Carbonate-Vitamin D (CALCIUM 600+D PO) [id number] Yes Take 1 tablet by mouth in the morning and at bedtime. [provider] Taking Active Self  Cholecalciferol (VITAMIN D) 2000 UNITS tablet [id number]  Take 4,000 Units by mouth daily. [provider]  Active Self  dorzolamide (TRUSOPT) 2 % ophthalmic solution [id number] Yes Place 1 drop into the  left eye 2 (two) times daily. [provider] Taking Active Self  ELIQUIS 2.5 MG TABS tablet [id number] Yes TAKE 1 TABLET BY MOUTH  TWICE DAILY Chilton Si, MD Taking Active   furosemide (LASIX) 40 MG tablet [id number] Yes Take 1 tablet (40 mg total) by mouth 2 (two) times daily. May take an additional 40 mg as needed for swelling or weight gain Chilton Si, MD Taking Active   isosorbide mononitrate (IMDUR) 30 MG 24 hr tablet [id number] Yes TAKE 1 TABLET BY MOUTH  DAILY Chilton Si, MD Taking Active   latanoprost (XALATAN) 0.005 % ophthalmic solution [id number] Yes Place 1 drop into both eyes at bedtime.  [provider] Taking Active Self  levothyroxine (SYNTHROID, LEVOTHROID) 100 MCG tablet [id number] Yes Take 100 mcg by mouth daily.  [provider] Taking Active Self           Med Note Garner Nash, MICHELE   Mon Nov 18, 2016 10:47 AM)    Loperamide HCl (IMODIUM PO) [id number] Yes Take 1 capsule by mouth 2 (two) times daily as needed. [provider] Taking Active   MAGNESIUM OXIDE 400 PO [id number] Yes Take 800 mg by mouth daily. [provider] Taking Active Self  Multiple Vitamins-Minerals (CENTRUM SILVER PO) [id number] Yes Take 1 tablet by mouth daily. [provider] Taking Active Self  Multiple Vitamins-Minerals (PRESERVISION AREDS 2 PO) [id number] Yes Take 1 capsule by mouth 2 (two) times daily.  [provider] Taking Active Self  nitroGLYCERIN (NITROSTAT) 0.4 MG SL tablet [id number] No DISSOLVE 1 TABLET UNDER THE TONGUE  EVERY 5 MINUTES FOR 3 DOSES AS NEEDED  FOR  CHEST  PAIN  Patient not taking: Reported on 11/13/2021   Chilton Si, MD Not Taking Active Self  omeprazole (PRILOSEC) 20 MG capsule [id number]  Take 1 capsule (20 mg total) by mouth daily.  Patient taking differently: Take 20 mg by mouth as needed.   Sharon Seller, NP  Active   potassium chloride (KLOR-CON) 10 MEQ tablet [id number] Yes Take 10 mEq by  mouth daily. [provider] Taking Active   pramipexole (MIRAPEX) 0.125 MG tablet [id number] Yes Take 1 tablet (0.125 mg total) by mouth at bedtime. [name], NP Taking Active   PREMARIN vaginal cream [id number] Yes Place 2 g vaginally 2 (two) times a week. Uses on Sunday and wednesday [provider] Taking Active Self  Probiotic Product (PROBIOTIC DAILY PO) [id number] Yes Take 1 tablet by mouth daily. [provider] Taking Active Self  sodium chloride (MURO 128) 5 % ophthalmic solution [id number] Yes Place 1 drop into the right eye at bedtime.  [provider] Taking Active Self  temazepam (RESTORIL) 15 MG capsule [id number] Yes TAKE 1 CAPSULE(15 MG TOTAL) BY MOUTH AT BEDTIME. [name], NP Taking Active   Med List Note Para March, Maci D, CPhT 12/10/19 1739): No preferred pharmacy              SDOH:  (Social Determinants of Health) assessments and interventions performed:  SDOH Interventions    Flowsheet Row Most Recent Value  SDOH Interventions   Food Insecurity Interventions Intervention Not Indicated  Housing Interventions Intervention Not Indicated  Transportation Interventions Intervention Not Indicated       Care Plan  Review of patient past medical history, allergies, medications, health status, including review of consultants reports, laboratory and other test data, was performed as part of comprehensive evaluation for care management services.   Care Plan : RN Care Manager Plan of Care  Updates made by Miqueas Whilden, Dennard Schaumann, RN since 11/15/2021 12:00 AM     Problem: Knowledge Deficit Related to CongestiveHeart Failure and Care Coordination Needs   Priority: High     Long-Range Goal: Patient-Specific Goal   Start Date: 08/07/2021  Expected End Date: 08/17/2022  Priority: High  Note:   Current Barriers:  Knowledge Deficits related to plan of care for management of CHF   RNCM Clinical Goal(s):  Patient will verbalize  understanding of plan for management of CHF as evidenced by continuation of monitoring weight and adhering to 2300 mg low sodium diet  through collaboration with RN Care manager, provider, and care team.   Interventions: Inter-disciplinary care team collaboration (see longitudinal plan of care) Evaluation of current treatment plan related to  self management and patient's adherence to plan as established by provider   Patient Goals/Self-Care Activities: Take medications as prescribed   Attend all scheduled provider appointments Call pharmacy for medication refills 3-7 days in advance of running out of medications Attend church or other social activities Perform all self care activities independently  Perform IADL's (shopping, preparing meals, housekeeping, managing finances) independently Call provider office for new concerns or questions  call the Suicide and Crisis Lifeline: 988 if experiencing a Mental Health or Behavioral Health Crisis  call office if I gain more than 2 pounds in one day or 5 pounds in one week use salt in moderation watch for swelling in feet, ankles and legs every day weigh myself daily follow rescue plan if symptoms flare-up know when to call the  doctor inf symptoms worsen to prevent CHF exacerbation track symptoms and what helps feel better or worse dress right for the weather, hot or cold   [id number] Patient monitors weight daily. No swelling in lower extremities. Patient takes medication as per ordered. She is doing leg and hand exercises daily. She has some problem with constipation. RN sent educational information on high fiber diet.  She has a prolapse uterus and is interested in Kegel exercises. She is getting eye injections for macular degeneration. Per patient she is actively socializing with bible study and playing games with friends.       Plan: Telephone follow up appointment with care management team member scheduled for:  January 28, 2022 The patient  has been provided with contact information for the care management team and has been advised to call with any health related questions or concerns.   Gean Maidens BSN RN [facility] Management [phone]

## 2021-11-15 NOTE — Patient Instructions (Signed)
Visit Information ? ?Thank you for taking time to visit with me today. Please don't hesitate to contact me if I can be of assistance to you before our next scheduled telephone appointment. ? ?Following are the goals we discussed today:  ?Current Barriers:  ?Knowledge Deficits related to plan of care for management of CHF  ? ?RNCM Clinical Goal(s):  ?Patient will verbalize understanding of plan for management of CHF as evidenced by continuation of monitoring weight and adhering to 2300 mg low sodium diet through collaboration with RN Care manager, provider, and care team.  ? ?Interventions: ?Inter-disciplinary care team collaboration (see longitudinal plan of care) ?Evaluation of current treatment plan related to  self management and patient's adherence to plan as established by provider ? ? ?Patient Goals/Self-Care Activities: ?Take medications as prescribed   ?Attend all scheduled provider appointments ?Call pharmacy for medication refills 3-7 days in advance of running out of medications ?Attend church or other social activities ?Perform all self care activities independently  ?Perform IADL's (shopping, preparing meals, housekeeping, managing finances) independently ?Call provider office for new concerns or questions  ?call the Suicide and Crisis Lifeline: 988 if experiencing a Mental Health or [place]  ?call office if I gain more than 2 pounds in one day or 5 pounds in one week ?use salt in moderation ?watch for swelling in feet, ankles and legs every day ?weigh myself daily ?follow rescue plan if symptoms flare-up ?know when to call the doctor inf symptoms worsen to prevent CHF exacerbation ?track symptoms and what helps feel better or worse ?dress right for the weather, hot or cold ?  ?[id number] Patient monitors weight daily. No swelling in lower extremities. Patient takes medication as per ordered. She is doing leg and hand exercises daily. She has some problem with constipation. RN sent  educational information on high fiber diet.  She has a prolapse uterus and is interested in Kegel exercises. She is getting eye injections for macular degeneration. Per patient she is actively socializing with bible study and playing games with friends.  ? ?Our next appointment is by telephone on January 28, 2022 ? ?Please call Johny Shock RN [phone] if you need to cancel or reschedule your appointment.  ? ?Please call the Suicide and Crisis Lifeline: 988 if you are experiencing a Mental Health or [place] or need someone to talk to. ? ?The patient verbalized understanding of instructions, educational materials, and care plan provided today and agreed to receive a mailed copy of patient instructions, educational materials, and care plan.  ? ?Telephone follow up appointment with care management team member scheduled for: ?The patient has been provided with contact information for the care management team and has been advised to call with any health related questions or concerns.  ? ?SIGNATURE ? ?Johny Shock BSN RN ?[place] Management ?[phone] ? ? ?  ?

## 2021-11-28 ENCOUNTER — Encounter: Payer: Self-pay | Admitting: Orthopedic Surgery

## 2021-11-28 NOTE — Progress Notes (Signed)
This encounter was created in error - please disregard.

## 2021-12-10 ENCOUNTER — Telehealth (INDEPENDENT_AMBULATORY_CARE_PROVIDER_SITE_OTHER): Payer: Medicare Other | Admitting: Orthopedic Surgery

## 2021-12-10 ENCOUNTER — Encounter: Payer: Self-pay | Admitting: Orthopedic Surgery

## 2021-12-10 DIAGNOSIS — R197 Diarrhea, unspecified: Secondary | ICD-10-CM | POA: Diagnosis not present

## 2021-12-10 NOTE — Patient Instructions (Signed)
-   encourage hydration with water ?- recommend eating foods high in fiber to bulk up stool (beans/sweet potatoes, peas, broccoli) ?- advised to hold magnesium until bowel movements return to normal  ?- contact PCP if diarrhea does not subside ?

## 2021-12-10 NOTE — Progress Notes (Signed)
? ? ?Careteam: ?Patient Care Team: ?[name], NP as PCP - General (Adult Health Nurse Practitioner) ?[name], MD as PCP - Cardiology (Cardiology) ?Pleasant, [name], RN as [place] Management ? ?Seen by: [name], AGNP-C ? ?PLACE OF SERVICE:  ?[facility]  ?Advanced Directive information ?Does Patient Have a Medical Advance Directive?: No, Would patient like information on creating a medical advance directive?: No - Patient declined ? ?Allergies  ?Allergen Reactions  ? Azithromycin Nausea Only and Other (See Comments)  ? Compazine [Prochlorperazine Edisylate] Nausea Only  ? Demerol Nausea Only  ? Dolophine [Methadone] Nausea Only  ? Ebastine Nausea Only  ?  EBS  ? Erythromycin Nausea Only and Other (See Comments)  ? Ibuprofen Nausea Only and Other (See Comments)  ? Meperidine Hcl Other (See Comments)  ? Morphine Sulfate Other (See Comments)  ? Statins Other (See Comments)  ?  myalgias  ? Tetanus Toxoids Nausea Only  ? Tetracycline Other (See Comments)  ? Tetracyclines & Related Nausea Only  ? ? ?Chief Complaint  ?Patient presents with  ? Acute Visit  ?  Diarrhea for 3 days  ? ? ? ?HPI: Patient is a 88 y.o. female seen today via telephone visit due to diarrhea.  ? ?04/21 she reports having constipation. She took 2 senna plus 2x. After taking senna plus she ended up having diarrhea x 2 days. She ended up taking imodium this morning and diarrhea subsided. She is afebrile. Denies abdominal pain, N/V, chills or body aches. Able to eat and drink without difficulty.  She is worried diarrhea will come back. Seeking medical attention on ways to prevent diarrhea.  ? ?Review of Systems:  ?Review of Systems  ?Constitutional:  Negative for chills, fever, malaise/fatigue and weight loss.  ?Respiratory:  Negative for cough, shortness of breath and wheezing.   ?Cardiovascular:  Negative for chest pain and leg swelling.  ?Gastrointestinal:  Positive for constipation and diarrhea. Negative for  abdominal pain, blood in stool, heartburn, nausea and vomiting.  ?Genitourinary:  Positive for dysuria.  ?Neurological:  Negative for weakness.  ?Psychiatric/Behavioral:  Negative for depression and memory loss. The patient is not nervous/anxious.   ? ?Past Medical History:  ?Diagnosis Date  ? Bladder prolapse, female, acquired   ? Cancer [facility])   ? thyroid  ? Chest pain   ? no known ischemic heart disease; negative Myoview July 2013. EF 75% with no ischemia.   ? Chronic anticoagulation   ? Chronic atrial fibrillation (HCC)   ? managed with rate control and coumadin  ? Chronic diastolic heart failure ([place])   ? GERD (gastroesophageal reflux disease)   ? Heart disease   ? History of congenital mitral regurgitation   ? mild  ? History of heart attack   ? Multiple   ? History of mitral valve prolapse 06/15/2008  ? a. echo 1/14: mild LVH, EF 60-65%, mild MR, mild to mod BAE, PASP 35  ? Hypercholesterolemia   ? Hypertension   ? Hypothyroidism   ? ?Past Surgical History:  ?Procedure Laterality Date  ? ABDOMINAL HYSTERECTOMY    ? CATARACT EXTRACTION    ? CHOLECYSTECTOMY    ? Thyroidectomy    ? TONSILLECTOMY    ? ?Social History: ?  reports that she has never smoked. She has never used smokeless tobacco. She reports that she does not drink alcohol and does not use drugs. ? ?Family History  ?Problem Relation Age of Onset  ? Stroke Mother 69  ?  Stroke Father 54  ? Heart attack Neg Hx   ? Heart disease Neg Hx   ? ? ?Medications: ?Patient's Medications  ?New Prescriptions  ? No medications on file  ?Previous Medications  ? AMLODIPINE (NORVASC) 2.5 MG TABLET    Take 2.5 mg by mouth at bedtime.  ? ATENOLOL (TENORMIN) 50 MG TABLET    TAKE 1 TABLET BY MOUTH  TWICE DAILY  ? CALCIUM CARBONATE-VITAMIN D (CALCIUM 600+D PO)    Take 1 tablet by mouth in the morning and at bedtime.  ? CHOLECALCIFEROL (VITAMIN D) 2000 UNITS TABLET    Take 4,000 Units by mouth daily.  ? DORZOLAMIDE (TRUSOPT) 2 % OPHTHALMIC SOLUTION    Place 1 drop into the  left eye 2 (two) times daily.  ? ELIQUIS 2.5 MG TABS TABLET    TAKE 1 TABLET BY MOUTH  TWICE DAILY  ? FUROSEMIDE (LASIX) 40 MG TABLET    Take 1 tablet (40 mg total) by mouth 2 (two) times daily. May take an additional 40 mg as needed for swelling or weight gain  ? ISOSORBIDE MONONITRATE (IMDUR) 30 MG 24 HR TABLET    TAKE 1 TABLET BY MOUTH  DAILY  ? LATANOPROST (XALATAN) 0.005 % OPHTHALMIC SOLUTION    Place 1 drop into both eyes at bedtime.   ? LEVOTHYROXINE (SYNTHROID, LEVOTHROID) 100 MCG TABLET    Take 100 mcg by mouth daily.   ? LOPERAMIDE HCL (IMODIUM PO)    Take 1 capsule by mouth 2 (two) times daily as needed.  ? MAGNESIUM OXIDE 400 PO    Take 800 mg by mouth daily.  ? MULTIPLE VITAMINS-MINERALS (CENTRUM SILVER PO)    Take 1 tablet by mouth daily.  ? MULTIPLE VITAMINS-MINERALS (PRESERVISION AREDS 2 PO)    Take 1 capsule by mouth 2 (two) times daily.   ? NITROGLYCERIN (NITROSTAT) 0.4 MG SL TABLET    DISSOLVE 1 TABLET UNDER THE TONGUE EVERY 5 MINUTES FOR 3 DOSES AS NEEDED  FOR  CHEST  PAIN  ? POTASSIUM CHLORIDE (KLOR-CON) 10 MEQ TABLET    Take 10 mEq by mouth daily.  ? PRAMIPEXOLE (MIRAPEX) 0.125 MG TABLET    Take 1 tablet (0.125 mg total) by mouth at bedtime.  ? PREMARIN VAGINAL CREAM    Place 2 g vaginally 2 (two) times a week. Uses on Sunday and wednesday  ? PROBIOTIC PRODUCT (PROBIOTIC DAILY PO)    Take 1 tablet by mouth daily.  ? SODIUM CHLORIDE (MURO 128) 5 % OPHTHALMIC SOLUTION    Place 1 drop into the right eye at bedtime.   ? TEMAZEPAM (RESTORIL) 15 MG CAPSULE    TAKE 1 CAPSULE(15 MG TOTAL) BY MOUTH AT BEDTIME.  ?Modified Medications  ? No medications on file  ?Discontinued Medications  ? ALBUTEROL (VENTOLIN HFA) 108 (90 BASE) MCG/ACT INHALER    SMARTSIG:2 Puff(s) By Mouth Every 6 Hours PRN  ? OMEPRAZOLE (PRILOSEC) 20 MG CAPSULE    Take 1 capsule (20 mg total) by mouth daily.  ? ? ?Physical Exam: ? ?There were no vitals filed for this visit. ?There is no height or weight on file to calculate BMI. ?Wt  Readings from Last 3 Encounters:  ?08/23/21 125 lb 9.6 oz (57 kg)  ?07/10/21 122 lb 6.4 oz (55.5 kg)  ?05/14/21 123 lb (55.8 kg)  ? ? ?Physical Exam: unable to perform due to telephone encounter ? ?Labs reviewed: ?Basic Metabolic Panel: ?Recent Labs  ?  04/12/21 ?1219 08/23/21 ?5885  ?NA 135 139  ?  K 4.0 4.1  ?CL 96* 100  ?CO2 31 31  ?GLUCOSE 83 85  ?BUN 10 12  ?CREATININE 0.72 0.78  ?CALCIUM 8.5* 9.0  ?TSH 0.41  --   ? ?Liver Function Tests: ?Recent Labs  ?  04/12/21 ?1219 08/23/21 ?1459  ?AST 19 22  ?ALT 12 14  ?BILITOT 0.8 0.6  ?PROT 6.6 6.5  ? ?No results for input(s): LIPASE, AMYLASE in the last 8760 hours. ?No results for input(s): AMMONIA in the last 8760 hours. ?CBC: ?Recent Labs  ?  04/12/21 ?1219 08/23/21 ?1459  ?WBC 5.2 5.4  ?NEUTROABS 2,246 2,635  ?HGB 14.1 14.1  ?HCT 42.1 43.3  ?MCV 96.1 95.4  ?PLT 268 253  ? ?Lipid Panel: ?No results for input(s): CHOL, HDL, LDLCALC, TRIG, CHOLHDL, LDLDIRECT in the last 8760 hours. ?TSH: ?Recent Labs  ?  04/12/21 ?1219  ?TSH 0.41  ? ?A1C: ?No results found for: HGBA1C ? ? ?Assessment/Plan ?1. Diarrhea, unspecified type ?- suspect due to using senna plus x 2 doses on 04/21 ?- afebrile, denies abdominal pain, N/V ?- encourage hydration with water ?- recommend eating foods high in fiber to bulk up stool ?- advised to hold magnesium until bowel movements return to normal  ?- contact PCP if diarrhea does not subside ? ?Telephone Note  ? ?I connected with Clydell Hakim by telephone and verified that I am speaking with the correct person using two identifiers.  ? ?Patient location: home ?Patient: Kristen Whitehead ?Provider: [name], NP  ?Provider location: [facility] ?   ?I discussed the limitations, risks, security and privacy concerns of performing an evaluation and management service by telephone and the availability of in person appointments. I also discussed with the patient that there may be a patient responsible charge related to this service. The  patient expressed understanding and agreed to proceed.  ? ? ?I discussed the assessment and treatment plan with the patient. The patient was provided an opportunity to ask questions and all were answered. T

## 2021-12-11 ENCOUNTER — Telehealth: Payer: Self-pay | Admitting: Cardiovascular Disease

## 2021-12-11 MED ORDER — ISOSORBIDE MONONITRATE ER 30 MG PO TB24: 30.0000 mg | ORAL_TABLET | Freq: Every day | ORAL | 3 refills | Status: DC

## 2021-12-11 NOTE — Telephone Encounter (Signed)
Completed.

## 2021-12-11 NOTE — Telephone Encounter (Signed)
? ? ?*  STAT* If patient is at the pharmacy, call can be transferred to refill team. ? ? ?1. Which medications need to be refilled? (please list name of each medication and dose if known)  ? isosorbide mononitrate (IMDUR) 30 MG 24 hr tablet  ? ? ?2. Which pharmacy/location (including street and city if local pharmacy) is medication to be sent to?[facility] ([place], [place]  ? ?3. Do they need a 30 day or 90 day supply? 90 days ? ?

## 2021-12-14 ENCOUNTER — Other Ambulatory Visit: Payer: Self-pay | Admitting: Orthopedic Surgery

## 2021-12-14 DIAGNOSIS — G4709 Other insomnia: Secondary | ICD-10-CM

## 2021-12-17 ENCOUNTER — Other Ambulatory Visit: Payer: Self-pay | Admitting: Neurology

## 2021-12-17 DIAGNOSIS — H353211 Exudative age-related macular degeneration, right eye, with active choroidal neovascularization: Secondary | ICD-10-CM | POA: Diagnosis not present

## 2021-12-17 DIAGNOSIS — G2581 Restless legs syndrome: Secondary | ICD-10-CM

## 2021-12-18 ENCOUNTER — Other Ambulatory Visit: Payer: Self-pay | Admitting: *Deleted

## 2021-12-18 DIAGNOSIS — G2581 Restless legs syndrome: Secondary | ICD-10-CM

## 2021-12-18 MED ORDER — PRAMIPEXOLE DIHYDROCHLORIDE 0.125 MG PO TABS: 0.1250 mg | ORAL_TABLET | Freq: Every day | ORAL | 0 refills | Status: DC

## 2021-12-18 NOTE — Telephone Encounter (Signed)
[facility] requested refill.  ? ?

## 2021-12-26 ENCOUNTER — Other Ambulatory Visit: Payer: Self-pay | Admitting: Orthopedic Surgery

## 2021-12-26 DIAGNOSIS — G2581 Restless legs syndrome: Secondary | ICD-10-CM

## 2021-12-31 ENCOUNTER — Telehealth: Payer: Self-pay

## 2021-12-31 ENCOUNTER — Other Ambulatory Visit: Payer: Self-pay

## 2021-12-31 NOTE — Telephone Encounter (Signed)
Patient called reporting diarrhea and constipation. She reports taking over the counter anti diarrhea medications. She also states that he bladder feels like it is falling out and she has already seen OB-GYN for it. She scheduled appointment for tomorrow,01/01/22 @ 9:00 AM w/ Dani Gobble via telephone. ?

## 2022-01-01 ENCOUNTER — Ambulatory Visit (INDEPENDENT_AMBULATORY_CARE_PROVIDER_SITE_OTHER): Payer: Medicare Other | Admitting: Nurse Practitioner

## 2022-01-01 DIAGNOSIS — R198 Other specified symptoms and signs involving the digestive system and abdomen: Secondary | ICD-10-CM

## 2022-01-01 NOTE — Progress Notes (Signed)
? ? ?Careteam: ?Patient Care Team: ?[name], NP as PCP - General (Adult Health Nurse Practitioner) ?[name], MD as PCP - Cardiology (Cardiology) ?Pleasant, [name], RN as [facility] Management ? ?Advanced Directive information ?  ? ?Allergies  ?Allergen Reactions  ? Azithromycin Nausea Only and Other (See Comments)  ? Compazine [Prochlorperazine Edisylate] Nausea Only  ? Demerol Nausea Only  ? Dolophine [Methadone] Nausea Only  ? Ebastine Nausea Only  ?  EBS  ? Erythromycin Nausea Only and Other (See Comments)  ? Ibuprofen Nausea Only and Other (See Comments)  ? Meperidine Hcl Other (See Comments)  ? Morphine Sulfate Other (See Comments)  ? Statins Other (See Comments)  ?  myalgias  ? Tetanus Toxoids Nausea Only  ? Tetracycline Other (See Comments)  ? Tetracyclines & Related Nausea Only  ? ? ?Chief Complaint  ?Patient presents with  ? Acute Visit  ?  Patient complains of still having diarrhea and constipation. Patient states she has a floating bladder. She had pessary. Constipation is very painful. She has been taking imodium for diarrhea. Stool very hard. Patient states that she eats a good diet. No abdominal pain. She does say she has some weakness. Patient states that she has gained weight Patient has also been taking a laxative.  ? ? ? ?HPI: Patient is a 88 y.o. female due to ongoing bowel issues.  ?She is having either constipation or diarrhea for weeks.  ?Constipation is painful.  ?She is constipated today. When she got up she could not urinate (hx of prolapse bladder)  ?She had a small bowel movement this morning. ? ?Eats a good amount of fruit and reports she eats right.  ?She has a hard time drinking enough water, has a hard time with this.  ?She was taking metamucil but recently not liking it.  ?She generally takes senna plus.  ?Review of Systems:  ?Review of Systems  ?Constitutional:  Negative for chills and fever.  ?Gastrointestinal:  Positive for constipation and  diarrhea. Negative for abdominal pain, blood in stool, melena, nausea and vomiting.  ? ?Past Medical History:  ?Diagnosis Date  ? Bladder prolapse, female, acquired   ? Cancer [facility])   ? thyroid  ? Chest pain   ? no known ischemic heart disease; negative Myoview July 2013. EF 75% with no ischemia.   ? Chronic anticoagulation   ? Chronic atrial fibrillation (HCC)   ? managed with rate control and coumadin  ? Chronic diastolic heart failure ([place])   ? GERD (gastroesophageal reflux disease)   ? Heart disease   ? History of congenital mitral regurgitation   ? mild  ? History of heart attack   ? Multiple   ? History of mitral valve prolapse 06/15/2008  ? a. echo 1/14: mild LVH, EF 60-65%, mild MR, mild to mod BAE, PASP 35  ? Hypercholesterolemia   ? Hypertension   ? Hypothyroidism   ? ?Past Surgical History:  ?Procedure Laterality Date  ? ABDOMINAL HYSTERECTOMY    ? CATARACT EXTRACTION    ? CHOLECYSTECTOMY    ? Thyroidectomy    ? TONSILLECTOMY    ? ?Social History: ?  reports that she has never smoked. She has never used smokeless tobacco. She reports that she does not drink alcohol and does not use drugs. ? ?Family History  ?Problem Relation Age of Onset  ? Stroke Mother 35  ? Stroke Father 68  ? Heart attack Neg Hx   ? Heart disease  Neg Hx   ? ? ?Medications: ?Patient's Medications  ?New Prescriptions  ? No medications on file  ?Previous Medications  ? AMLODIPINE (NORVASC) 2.5 MG TABLET    Take 2.5 mg by mouth at bedtime.  ? ATENOLOL (TENORMIN) 50 MG TABLET    TAKE 1 TABLET BY MOUTH  TWICE DAILY  ? CALCIUM CARBONATE-VITAMIN D (CALCIUM 600+D PO)    Take 1 tablet by mouth in the morning and at bedtime.  ? CHOLECALCIFEROL (VITAMIN D) 2000 UNITS TABLET    Take 4,000 Units by mouth daily.  ? DORZOLAMIDE (TRUSOPT) 2 % OPHTHALMIC SOLUTION    Place 1 drop into the left eye 2 (two) times daily.  ? ELIQUIS 2.5 MG TABS TABLET    TAKE 1 TABLET BY MOUTH  TWICE DAILY  ? FUROSEMIDE (LASIX) 40 MG TABLET    Take 1 tablet (40 mg total) by  mouth 2 (two) times daily. May take an additional 40 mg as needed for swelling or weight gain  ? ISOSORBIDE MONONITRATE (IMDUR) 30 MG 24 HR TABLET    Take 1 tablet (30 mg total) by mouth daily.  ? LATANOPROST (XALATAN) 0.005 % OPHTHALMIC SOLUTION    Place 1 drop into both eyes at bedtime.   ? LEVOTHYROXINE (SYNTHROID, LEVOTHROID) 100 MCG TABLET    Take 100 mcg by mouth daily.   ? LOPERAMIDE HCL (IMODIUM PO)    Take 1 capsule by mouth 2 (two) times daily as needed.  ? MAGNESIUM OXIDE 400 PO    Take 800 mg by mouth daily.  ? MULTIPLE VITAMINS-MINERALS (CENTRUM SILVER PO)    Take 1 tablet by mouth daily.  ? MULTIPLE VITAMINS-MINERALS (PRESERVISION AREDS 2 PO)    Take 1 capsule by mouth 2 (two) times daily.   ? NITROGLYCERIN (NITROSTAT) 0.4 MG SL TABLET    DISSOLVE 1 TABLET UNDER THE TONGUE EVERY 5 MINUTES FOR 3 DOSES AS NEEDED  FOR  CHEST  PAIN  ? POTASSIUM CHLORIDE (KLOR-CON) 10 MEQ TABLET    Take 10 mEq by mouth daily.  ? PRAMIPEXOLE (MIRAPEX) 0.125 MG TABLET    Take 1 tablet (0.125 mg total) by mouth at bedtime.  ? PREMARIN VAGINAL CREAM    Place 2 g vaginally 2 (two) times a week. Uses on Sunday and wednesday  ? PROBIOTIC PRODUCT (PROBIOTIC DAILY PO)    Take 1 tablet by mouth daily.  ? SODIUM CHLORIDE (MURO 128) 5 % OPHTHALMIC SOLUTION    Place 1 drop into the right eye at bedtime.   ? TEMAZEPAM (RESTORIL) 15 MG CAPSULE    TAKE 1 CAPSULE(15 MG) BY MOUTH AT BEDTIME  ?Modified Medications  ? No medications on file  ?Discontinued Medications  ? No medications on file  ? ? ?Physical Exam: ? ?There were no vitals filed for this visit. ?There is no height or weight on file to calculate BMI. ?Wt Readings from Last 3 Encounters:  ?08/23/21 125 lb 9.6 oz (57 kg)  ?07/10/21 122 lb 6.4 oz (55.5 kg)  ?05/14/21 123 lb (55.8 kg)  ? ? ? ? ?Labs reviewed: ?Basic Metabolic Panel: ?Recent Labs  ?  04/12/21 ?1219 08/23/21 ?9509  ?NA 135 139  ?K 4.0 4.1  ?CL 96* 100  ?CO2 31 31  ?GLUCOSE 83 85  ?BUN 10 12  ?CREATININE 0.72 0.78   ?CALCIUM 8.5* 9.0  ?TSH 0.41  --   ? ?Liver Function Tests: ?Recent Labs  ?  04/12/21 ?1219 08/23/21 ?1459  ?AST 19 22  ?ALT 12  14  ?BILITOT 0.8 0.6  ?PROT 6.6 6.5  ? ?No results for input(s): LIPASE, AMYLASE in the last 8760 hours. ?No results for input(s): AMMONIA in the last 8760 hours. ?CBC: ?Recent Labs  ?  04/12/21 ?1219 08/23/21 ?1459  ?WBC 5.2 5.4  ?NEUTROABS 2,246 2,635  ?HGB 14.1 14.1  ?HCT 42.1 43.3  ?MCV 96.1 95.4  ?PLT 268 253  ? ?Lipid Panel: ?No results for input(s): CHOL, HDL, LDLCALC, TRIG, CHOLHDL, LDLDIRECT in the last 8760 hours. ?TSH: ?Recent Labs  ?  04/12/21 ?1219  ?TSH 0.41  ? ?A1C: ?No results found for: HGBA1C ? ? ?Assessment/Plan ?1. Alternating constipation and diarrhea ?-stop immodium ?-to restart fiber supplement- can use benefiber ?-make sure you drink enough fluid ?-to start senna plus daily- hold for diarrhea.  ? ?Follow up precautions given.  ?Carlos American. Dewaine Oats, AGNP ? ?[facility] ?[phone]  ? ? ?Virtual Visit via telelphone ? ?I connected with patient on 01/01/22 at  9:00 AM EDT by telephone and verified that I am speaking with the correct person using two identifiers. ? ?Location: ?Patient: home ?Provider: [facility] ?  ?I discussed the limitations, risks, security and privacy concerns of performing an evaluation and management service by telephone and the availability of in person appointments. I also discussed with the patient that there may be a patient responsible charge related to this service. The patient expressed understanding and agreed to proceed. ? ? ?I discussed the assessment and treatment plan with the patient. The patient was provided an opportunity to ask questions and all were answered. The patient agreed with the plan and demonstrated an understanding of the instructions. ?  ?The patient was advised to call back or seek an in-person evaluation if the symptoms worsen or if the condition fails to improve as anticipated. ? ?I  provided 16 minutes of non-face-to-face time during this encounter. ? ?Carlos American. Dewaine Oats, AGNP ?Avs printed and mailed  ? ?

## 2022-01-01 NOTE — Progress Notes (Signed)
This service is provided via telemedicine ? ?No vital signs collected/recorded due to the encounter was a telemedicine visit.  ? ?Location of patient (ex: home, work):  Home ? ?Patient consents to a telephone visit:  Yes, see encounter dated 06/13/2021 ? ?Location of the provider (ex: office, home):  [place] ? ?Name of any referring provider:  [name], NP ? ?Names of all persons participating in the telemedicine service and their role in the encounter:  Sherrie Mustache, Nurse Practitioner, Carroll Kinds, CMA, and patient.  ? ?Time spent on call:  10 minutes with medical assistant ? ?

## 2022-01-01 NOTE — Telephone Encounter (Signed)
This encounter was created in error - please disregard.

## 2022-01-10 ENCOUNTER — Emergency Department (HOSPITAL_COMMUNITY)
Admission: EM | Admit: 2022-01-10 | Discharge: 2022-01-10 | Disposition: A | Discharge status: Home / Self Care | Payer: Medicare Other | Attending: Emergency Medicine | Admitting: Emergency Medicine

## 2022-01-10 ENCOUNTER — Emergency Department (HOSPITAL_COMMUNITY): Payer: Medicare Other

## 2022-01-10 ENCOUNTER — Telehealth: Payer: Self-pay | Admitting: *Deleted

## 2022-01-10 ENCOUNTER — Other Ambulatory Visit: Payer: Self-pay

## 2022-01-10 ENCOUNTER — Encounter (HOSPITAL_COMMUNITY): Payer: Self-pay

## 2022-01-10 DIAGNOSIS — Z7901 Long term (current) use of anticoagulants: Secondary | ICD-10-CM | POA: Insufficient documentation

## 2022-01-10 DIAGNOSIS — K5900 Constipation, unspecified: Secondary | ICD-10-CM | POA: Insufficient documentation

## 2022-01-10 DIAGNOSIS — Z743 Need for continuous supervision: Secondary | ICD-10-CM | POA: Diagnosis not present

## 2022-01-10 DIAGNOSIS — Z79899 Other long term (current) drug therapy: Secondary | ICD-10-CM | POA: Diagnosis not present

## 2022-01-10 DIAGNOSIS — R197 Diarrhea, unspecified: Secondary | ICD-10-CM | POA: Diagnosis not present

## 2022-01-10 LAB — URINALYSIS, ROUTINE W REFLEX MICROSCOPIC
Bilirubin Urine: NEGATIVE
Glucose, UA: NEGATIVE mg/dL
Ketones, ur: NEGATIVE mg/dL
Nitrite: NEGATIVE
Protein, ur: NEGATIVE mg/dL
Specific Gravity, Urine: 1.006 (ref 1.005–1.030)
WBC, UA: >50 WBC/hpf — ABNORMAL HIGH (ref 0–5)
pH: 7 (ref 5.0–8.0)

## 2022-01-10 MED ORDER — FLEET ENEMA 7-19 GM/118ML RE ENEM
1.0000 | ENEMA | Freq: Once | RECTAL | Status: AC
  Administered 2022-01-10: 1 via RECTAL
  Filled 2022-01-10: qty 1

## 2022-01-10 NOTE — ED Triage Notes (Addendum)
Pt bib [facility] c/o contipation off and on x 3 weeks. Pt states "I''m either constipated or have diarrhea" c/o rectal pain. Pt also c/o of pelvic pain states has not urinated since yesterday, hx prolapsed bladder.

## 2022-01-10 NOTE — Telephone Encounter (Signed)
Patient called and stated that she still has Constipation. Stated that it is no better. Was seen on 01/01/2022 by Janett Billow Patient has tried Laxative, Senna, Benefiber, vegetables and increased her water.   Patient has had a small BM today and sitting in the bathroom as we speaking.   Stated that her bladder has fallen out and is poking out alittle bit due to the constipation. Stated that she was told there was nothing she could do about that.   Stated that she has spent all day trying to get the stool out. Stated that she has done what Janett Billow has suggested.  And stated that she is NOT going to the Hospital.   Please Advise. (Forwarded to [employer] due to [place] out of office)

## 2022-01-10 NOTE — ED Provider Notes (Signed)
[facility] DEPT Provider Note   CSN: [id number] Arrival date & time: 01/10/22  1751     History  Chief Complaint  Patient presents with   Constipation    Kristen Whitehead is a 88 y.o. female.  88 year old female who presents with constipation times several weeks.  States this is her intermittent diarrhea.  Abdominal soft.  No emesis.  No fever or chills.  Patient states that she used to wear a pessary but it fell out about a year ago and since that time has had trouble urinating.  States he has not urinated today.  Denies any flank pain      Home Medications Prior to Admission medications   Medication Sig Start Date End Date Taking? Authorizing Provider  amLODipine (NORVASC) 2.5 MG tablet Take 2.5 mg by mouth at bedtime.    [provider]  atenolol (TENORMIN) 50 MG tablet TAKE 1 TABLET BY MOUTH  TWICE DAILY 11/12/21   O'Neal, Cassie Freer, MD  Calcium Carbonate-Vitamin D (CALCIUM 600+D PO) Take 1 tablet by mouth in the morning and at bedtime.    [provider]  Cholecalciferol (VITAMIN D) 2000 UNITS tablet Take 4,000 Units by mouth daily.    [provider]  dorzolamide (TRUSOPT) 2 % ophthalmic solution Place 1 drop into the left eye 2 (two) times daily. 09/24/19   [provider]  ELIQUIS 2.5 MG TABS tablet TAKE 1 TABLET BY MOUTH  TWICE DAILY 11/12/21   [name], MD  furosemide (LASIX) 40 MG tablet Take 1 tablet (40 mg total) by mouth 2 (two) times daily. May take an additional 40 mg as needed for swelling or weight gain 01/09/21   [name], MD  isosorbide mononitrate (IMDUR) 30 MG 24 hr tablet Take 1 tablet (30 mg total) by mouth daily. 12/11/21   [name], MD  latanoprost (XALATAN) 0.005 % ophthalmic solution Place 1 drop into both eyes at bedtime.  02/23/13   [provider]  levothyroxine (SYNTHROID, LEVOTHROID) 100 MCG tablet Take 100 mcg by mouth daily.  04/20/15   [provider]  Loperamide HCl (IMODIUM PO) Take 1 capsule by mouth 2 (two) times daily as needed.    [provider]  MAGNESIUM OXIDE 400 PO Take 800 mg by mouth daily.    [provider]  Multiple Vitamins-Minerals (CENTRUM SILVER PO) Take 1 tablet by mouth daily.    [provider]  Multiple Vitamins-Minerals (PRESERVISION AREDS 2 PO) Take 1 capsule by mouth 2 (two) times daily.     [provider]  nitroGLYCERIN (NITROSTAT) 0.4 MG SL tablet DISSOLVE 1 TABLET UNDER THE TONGUE EVERY 5 MINUTES FOR 3 DOSES AS NEEDED  FOR  CHEST  PAIN 12/23/18   [name], MD  potassium chloride (KLOR-CON) 10 MEQ tablet Take 10 mEq by mouth daily.    [provider]  pramipexole (MIRAPEX) 0.125 MG tablet Take 1 tablet (0.125 mg total) by mouth at bedtime. Patient not taking: Reported on 01/01/2022 12/18/21   [name], NP  PREMARIN vaginal cream Place 2 g vaginally 2 (two) times a week. Uses on Sunday and wednesday 03/25/18   [provider]  Probiotic Product (PROBIOTIC DAILY PO) Take 1 tablet by mouth daily.    [provider]  sodium chloride (MURO 128) 5 % ophthalmic solution Place 1 drop into the right eye at bedtime.     [provider]  temazepam (RESTORIL) 15 MG capsule TAKE 1 CAPSULE(15  MG) BY MOUTH AT BEDTIME 12/14/21   Fargo, Amy E, NP      Allergies    Azithromycin, Compazine [prochlorperazine edisylate], Demerol, Dolophine [methadone], Ebastine, Erythromycin, Ibuprofen, Meperidine hcl, Morphine sulfate, Statins, Tetanus toxoids, Tetracycline, and Tetracyclines & related    Review of Systems   Review of Systems  All other systems reviewed and are negative.  Physical Exam Updated Vital Signs BP (!) 124/97   Pulse 79   Temp 98.1 F (36.7 C) (Oral)   Resp 17   Ht 1.499 m ('4\' 11"'$ )   Wt 54.9 kg   SpO2 96%   BMI 24.44 kg/m  Physical Exam Vitals and nursing note reviewed.  Constitutional:      General: She is not in  acute distress.    Appearance: Normal appearance. She is well-developed. She is not toxic-appearing.  HENT:     Head: Normocephalic and atraumatic.  Eyes:     General: Lids are normal.     Conjunctiva/sclera: Conjunctivae normal.     Pupils: Pupils are equal, round, and reactive to light.  Neck:     Thyroid: No thyroid mass.     Trachea: No tracheal deviation.  Cardiovascular:     Rate and Rhythm: Normal rate and regular rhythm.     Heart sounds: Normal heart sounds. No murmur heard.   No gallop.  Pulmonary:     Effort: Pulmonary effort is normal. No respiratory distress.     Breath sounds: Normal breath sounds. No stridor. No decreased breath sounds, wheezing, rhonchi or rales.  Abdominal:     General: There is no distension.     Palpations: Abdomen is soft.     Tenderness: There is no abdominal tenderness. There is no rebound.  Genitourinary:    Comments: Soft stool noted in rectal vault. Musculoskeletal:        General: No tenderness. Normal range of motion.     Cervical back: Normal range of motion and neck supple.  Skin:    General: Skin is warm and dry.     Findings: No abrasion or rash.  Neurological:     Mental Status: She is alert and oriented to person, place, and time. Mental status is at baseline.     GCS: GCS eye subscore is 4. GCS verbal subscore is 5. GCS motor subscore is 6.     Cranial Nerves: No cranial nerve deficit.     Sensory: No sensory deficit.     Motor: Motor function is intact.  Psychiatric:        Attention and Perception: Attention normal.        Speech: Speech normal.        Behavior: Behavior normal.    ED Results / Procedures / Treatments   Labs (all labs ordered are listed, but only abnormal results are displayed) Labs Reviewed  URINALYSIS, ROUTINE W REFLEX MICROSCOPIC    EKG None  Radiology No results found.  Procedures Procedures    Medications Ordered in ED Medications  sodium phosphate (FLEET) 7-19 GM/118ML enema 1  enema (has no administration in time range)    ED Course/ Medical Decision Making/ A&P                           Medical Decision Making Amount and/or Complexity of Data Reviewed Labs: ordered.  Risk OTC drugs.   Patient here with constipation.  Copious amounts of stool in her rectal vault.  Patient given a enema  by nursing with good result.  Feels much better.  Patient did have a bladder scan performed which did show about half a liter of urine.  However after patient was able to have a good bowel movement, she was able to pee and feels much better at this time.  Patient will follow-up with her GYN doctor for her bladder prolapse       Final Clinical Impression(s) / ED Diagnoses Final diagnoses:  None    Rx / DC Orders ED Discharge Orders     None         Lacretia Leigh, MD 01/10/22 1949

## 2022-01-10 NOTE — Telephone Encounter (Signed)
Patient notified and agreed. Stated that she is going to ER now.

## 2022-01-10 NOTE — Telephone Encounter (Signed)
I really recommend ED evaluation due to bladder prolapse which could be preventing bowels to move despite using laxative,senna,Benefiber and eating vegetables. If not going to the ED may try suppository. But go to ED if unable to push back the bladder.

## 2022-01-10 NOTE — ED Notes (Signed)
Pt assisted back into bed from [facility].  Pt had moderate amount of soft brown stool and urine.  Pt states she feels much better.

## 2022-01-17 ENCOUNTER — Telehealth: Payer: Self-pay

## 2022-01-17 NOTE — Telephone Encounter (Signed)
Patient states she was in the hospital last Thursday with her constipation and bladder issues. She wants the bladder problem to be known that that is her main concern. She has been seeing a gyno and used to wear pessary but it doesn't stay in anymore and her bladder half ways hangs out. Her gyno has been out of the office for 3 mos and her next apnt is the 6th but the gyno mainly focuses on her bladder issues. Her bladder fallen down again she is urinating again but worried because she doesn't know whether she will be able to go.   She has been following instructions for her constipation and taking the fiber, prune juice and oranges but she can still  tell her BMs are slowling down again.  She just wants to be sure she isn't constipated and bladder disappears at the same time again.   She would like any additional info to stop the constipation.  To A. Cleophas Dunker, NP

## 2022-01-18 NOTE — Telephone Encounter (Signed)
Patient notified and agreed.  Patient scheduled an appointment on 01/21/2022 with Dinah. (Message forwarded to [place] for [facility])

## 2022-01-18 NOTE — Telephone Encounter (Signed)
She can come to the clinic for a follow up. TSH noted to have been done 8 months ago and might need to be updated. Constipation can be a symptom of hypothyroidism. Take 25 to 30 gms of fiber or more per day will help in preventing constipation.

## 2022-01-21 ENCOUNTER — Encounter: Payer: Self-pay | Admitting: Family

## 2022-01-21 ENCOUNTER — Ambulatory Visit (INDEPENDENT_AMBULATORY_CARE_PROVIDER_SITE_OTHER): Payer: Medicare Other | Admitting: Family

## 2022-01-21 VITALS — BP 90/70 | HR 74 | Temp 97.5°F | Resp 16 | Ht 59.0 in | Wt 126.4 lb

## 2022-01-21 DIAGNOSIS — N8110 Cystocele, unspecified: Secondary | ICD-10-CM

## 2022-01-21 DIAGNOSIS — R198 Other specified symptoms and signs involving the digestive system and abdomen: Secondary | ICD-10-CM | POA: Diagnosis not present

## 2022-01-21 MED ORDER — POLYETHYLENE GLYCOL 3350 17 GM/SCOOP PO POWD: 17.0000 g | Freq: Every day | ORAL | 1 refills | Status: DC | PRN

## 2022-01-21 NOTE — Patient Instructions (Signed)

## 2022-01-21 NOTE — Progress Notes (Unsigned)
Provider: Mandeep Kiser FNP-C  [name], NP  Patient Care Team: [name], NP as PCP - General (Adult Health Nurse Practitioner) [name], MD as PCP - Cardiology (Cardiology) Pleasant, [name], RN as [facility] Management  Extended Emergency Contact Information Primary Emergency Contact: Wilfred Lacy [place] Phone: [phone] Work Phone: [phone] Relation: Son Secondary Emergency Contact: Katz,Mitzi Address: [place], [place] Phone: [phone] Relation: Daughter  Code Status:  DNR Goals of care: Advanced Directive information    01/21/2022    3:26 PM  Advanced Directives  Does Patient Have a Medical Advance Directive? No  Would patient like information on creating a medical advance directive? No - Patient declined     Chief Complaint  Patient presents with   Acute Visit    Patient complains of constipation and diarrhea for 3 weeks. Patient also states that she cannot find her bladder.     HPI:  Pt is a 88 y.o. female seen today for an acute visit for evaluation of constipation and diarrhea x 3 weeks.diarrhea is described as loose without strong odor.she denies any fever,chills,abdominal pain,cramping,bloating,Nausea or vomiting. Her main issue today is her bladder which she states has prolapsed.Had a pessary in the past and was following up with Gynecology.states not a candidate for surgery.She denies any difficulties voiding.would like to know if there's anything else that can be done.    Past Medical History:  Diagnosis Date   Bladder prolapse, female, acquired    Cancer [facility])    thyroid   Chest pain    no known ischemic heart disease; negative Myoview July 2013. EF 75% with no ischemia.    Chronic anticoagulation    Chronic atrial fibrillation (HCC)    managed with rate control and coumadin   Chronic diastolic heart failure (HCC)     GERD (gastroesophageal reflux disease)    Heart disease    History of congenital mitral regurgitation    mild   History of heart attack    Multiple    History of mitral valve prolapse 06/15/2008   a. echo 1/14: mild LVH, EF 60-65%, mild MR, mild to mod BAE, PASP 35   Hypercholesterolemia    Hypertension    Hypothyroidism    Past Surgical History:  Procedure Laterality Date   ABDOMINAL HYSTERECTOMY     CATARACT EXTRACTION     CHOLECYSTECTOMY     Thyroidectomy     TONSILLECTOMY      Allergies  Allergen Reactions   Azithromycin Nausea Only and Other (See Comments)   Compazine [Prochlorperazine Edisylate] Nausea Only   Demerol Nausea Only   Dolophine [Methadone] Nausea Only   Ebastine Nausea Only    EBS   Erythromycin Nausea Only and Other (See Comments)   Ibuprofen Nausea Only and Other (See Comments)   Meperidine Hcl Other (See Comments)   Morphine Sulfate Other (See Comments)   Statins Other (See Comments)    myalgias   Tetanus Toxoids Nausea Only   Tetracycline Other (See Comments)   Tetracyclines & Related Nausea Only    Outpatient Encounter Medications as of 01/21/2022  Medication Sig   amLODipine (NORVASC) 2.5 MG tablet Take 2.5 mg by mouth at bedtime.   atenolol (TENORMIN) 50 MG tablet TAKE 1 TABLET BY MOUTH  TWICE DAILY   Calcium Carbonate-Vitamin D (CALCIUM 600+D PO) Take 1 tablet by mouth in  the morning and at bedtime.   Cholecalciferol (VITAMIN D) 2000 UNITS tablet Take 4,000 Units by mouth daily.   dorzolamide (TRUSOPT) 2 % ophthalmic solution Place 1 drop into the left eye 2 (two) times daily.   ELIQUIS 2.5 MG TABS tablet TAKE 1 TABLET BY MOUTH  TWICE DAILY   furosemide (LASIX) 40 MG tablet Take 1 tablet (40 mg total) by mouth 2 (two) times daily. May take an additional 40 mg as needed for swelling or weight gain   isosorbide mononitrate (IMDUR) 30 MG 24 hr tablet Take 1 tablet (30 mg total) by mouth daily.   latanoprost (XALATAN) 0.005 % ophthalmic solution  Place 1 drop into both eyes at bedtime.    levothyroxine (SYNTHROID, LEVOTHROID) 100 MCG tablet Take 100 mcg by mouth daily.    Loperamide HCl (IMODIUM PO) Take 1 capsule by mouth 2 (two) times daily as needed.   MAGNESIUM OXIDE 400 PO Take 800 mg by mouth daily.   Multiple Vitamins-Minerals (CENTRUM SILVER PO) Take 1 tablet by mouth daily.   Multiple Vitamins-Minerals (PRESERVISION AREDS 2 PO) Take 1 capsule by mouth 2 (two) times daily.    nitroGLYCERIN (NITROSTAT) 0.4 MG SL tablet DISSOLVE 1 TABLET UNDER THE TONGUE EVERY 5 MINUTES FOR 3 DOSES AS NEEDED  FOR  CHEST  PAIN   potassium chloride (KLOR-CON) 10 MEQ tablet Take 10 mEq by mouth daily.   pramipexole (MIRAPEX) 0.125 MG tablet Take 1 tablet (0.125 mg total) by mouth at bedtime.   PREMARIN vaginal cream Place 2 g vaginally 2 (two) times a week. Uses on Sunday and wednesday   Probiotic Product (PROBIOTIC DAILY PO) Take 1 tablet by mouth daily.   sodium chloride (MURO 128) 5 % ophthalmic solution Place 1 drop into the right eye at bedtime.    temazepam (RESTORIL) 15 MG capsule TAKE 1 CAPSULE(15 MG) BY MOUTH AT BEDTIME   No facility-administered encounter medications on file as of 01/21/2022.    Review of Systems  Constitutional:  Negative for appetite change, chills, fatigue and fever.  Gastrointestinal:  Negative for abdominal distention, abdominal pain, nausea and vomiting.       Intermittent diarrhea and constipation   Genitourinary:  Negative for decreased urine volume, difficulty urinating, dysuria, flank pain, frequency, hematuria, urgency, vaginal bleeding, vaginal discharge and vaginal pain.       Bladder prolapse   Musculoskeletal:  Positive for gait problem. Negative for back pain and myalgias.  Skin:  Negative for color change, pallor and rash.  Neurological:  Negative for dizziness, weakness, numbness and headaches.  Psychiatric/Behavioral:  Negative for agitation, confusion, hallucinations and sleep disturbance. The patient  is not nervous/anxious.    Immunization History  Administered Date(s) Administered   Influenza Split 04/28/2009, 06/07/2010, 04/30/2012, 06/08/2013   Influenza, High Dose Seasonal PF 04/21/2014, 04/17/2016, 06/18/2017, 06/19/2018, 04/16/2019, 07/03/2020, 06/11/2021   Influenza,inj,Quad PF,6+ Mos 05/20/2011   Influenza,inj,quad, With Preservative 04/13/2015   Moderna SARS-COV2 Booster Vaccination 01/18/2021   Moderna Sars-Covid-2 Vaccination 10/01/2019, 10/29/2019, 06/29/2020   Pfizer Covid-19 Vaccine Bivalent Booster 29yr & up 06/11/2021   Pneumococcal Conjugate-13 10/01/2013   Pneumococcal Polysaccharide-23 04/12/2010, 06/18/2017   Tdap 05/20/2011   Zoster, Live 06/04/2006   Pertinent  Health Maintenance Due  Topic Date Due   DEXA SCAN  Never done   INFLUENZA VACCINE  03/19/2022      04/19/2021    1:43 PM 04/19/2021    2:01 PM 08/23/2021    2:17 PM 01/10/2022    6:01 PM 01/21/2022  3:25 PM  Fall Risk  Falls in the past year? 1 1 0  0  Was there an injury with Fall? 1 1 0  0  Fall Risk Category Calculator '3 2 1  '$ 0  Fall Risk Category High Moderate Low  Low  Patient Fall Risk Level High fall risk High fall risk Low fall risk Low fall risk Low fall risk  Patient at Risk for Falls Due to History of fall(s) History of fall(s);Impaired balance/gait;Impaired mobility History of fall(s)  No Fall Risks  Fall risk Follow up Falls evaluation completed Falls evaluation completed;Education provided;Falls prevention discussed Falls evaluation completed;Education provided;Falls prevention discussed  Falls evaluation completed   Functional Status Survey:    Vitals:   01/21/22 1535  BP: 90/70  Pulse: 74  Resp: 16  Temp: (!) 97.5 F (36.4 C)  SpO2: 93%  Weight: 126 lb 6.4 oz (57.3 kg)  Height: '4\' 11"'$  (1.499 m)   Body mass index is 25.53 kg/m. Physical Exam Vitals reviewed. Exam conducted with a chaperone present [facility],CMA).  Constitutional:      General: She is not in  acute distress.    Appearance: Normal appearance. She is normal weight. She is not ill-appearing or diaphoretic.  HENT:     Right Ear: Ear canal normal.  Eyes:     General: No scleral icterus.       Right eye: No discharge.        Left eye: No discharge.     Conjunctiva/sclera: Conjunctivae normal.     Pupils: Pupils are equal, round, and reactive to light.  Cardiovascular:     Rate and Rhythm: Normal rate and regular rhythm.     Pulses: Normal pulses.     Heart sounds: Normal heart sounds. No murmur heard.   No friction rub. No gallop.  Pulmonary:     Effort: Pulmonary effort is normal. No respiratory distress.     Breath sounds: Normal breath sounds. No wheezing, rhonchi or rales.  Chest:     Chest wall: No tenderness.  Abdominal:     General: Bowel sounds are normal. There is no distension.     Palpations: Abdomen is soft. There is no mass.     Tenderness: There is no abdominal tenderness. There is no right CVA tenderness, left CVA tenderness, guarding or rebound.     Hernia: There is no hernia in the left inguinal area or right inguinal area.  Genitourinary:    Exam position: Lithotomy position.     Vagina: No vaginal discharge, erythema, tenderness, bleeding or lesions.     Comments: Bladder prolapse noted  Musculoskeletal:        General: No swelling or tenderness. Normal range of motion.     Right lower leg: No edema.     Left lower leg: No edema.     Comments: Unsteady gait steady with walker   Lymphadenopathy:     Lower Body: No right inguinal adenopathy. No left inguinal adenopathy.  Skin:    General: Skin is warm and dry.     Coloration: Skin is not pale.     Findings: No bruising, erythema, lesion or rash.  Neurological:     Mental Status: She is alert and oriented to person, place, and time.     Motor: No weakness.     Coordination: Coordination normal.     Gait: Gait abnormal.  Psychiatric:        Mood and Affect: Mood normal.  Speech: Speech normal.         Behavior: Behavior normal.    Labs reviewed: Recent Labs    04/12/21 1219 08/23/21 1459  NA 135 139  K 4.0 4.1  CL 96* 100  CO2 31 31  GLUCOSE 83 85  BUN 10 12  CREATININE 0.72 0.78  CALCIUM 8.5* 9.0   Recent Labs    04/12/21 1219 08/23/21 1459  AST 19 22  ALT 12 14  BILITOT 0.8 0.6  PROT 6.6 6.5   Recent Labs    04/12/21 1219 08/23/21 1459  WBC 5.2 5.4  NEUTROABS 2,246 2,635  HGB 14.1 14.1  HCT 42.1 43.3  MCV 96.1 95.4  PLT 268 253   Lab Results  Component Value Date   TSH 0.41 04/12/2021   No results found for: HGBA1C Lab Results  Component Value Date   CHOL 211 (H) 11/04/2012   HDL 66.80 11/04/2012   LDLCALC 109 (H) 04/16/2012   LDLDIRECT 98.8 11/04/2012   TRIG 201.0 (H) 11/04/2012   CHOLHDL 3 11/04/2012    Significant Diagnostic Results in last 30 days:  No results found.  Assessment/Plan  1. Female bladder prolapse Bladder prolapse noted on vaginal exam.able to push back. - advised to avoid straining with her bowel movement.  Discussed referral to urologist for further evaluation.  - Ambulatory referral to Urology  2. Alternating constipation and diarrhea Encourage to increase fiber in diet. Will add miralax whenever she has constipation.Strictly advised not to go beyond three days without a bowel movement. - Advised to increase fluid intake daily  - polyethylene glycol powder (GLYCOLAX/MIRALAX) 17 GM/SCOOP powder; Take 17 g by mouth daily as needed for moderate constipation. Hold for loose stool  Dispense: 3350 g; Refill: 1  Family/ staff Communication: Reviewed plan of care with patient verbalized understanding.  Labs/tests ordered: None   Next Appointment: Return if symptoms worsen or fail to improve.  Sandrea Hughs, NP

## 2022-01-22 DIAGNOSIS — N390 Urinary tract infection, site not specified: Secondary | ICD-10-CM | POA: Diagnosis not present

## 2022-01-22 DIAGNOSIS — R300 Dysuria: Secondary | ICD-10-CM | POA: Diagnosis not present

## 2022-01-22 DIAGNOSIS — H353211 Exudative age-related macular degeneration, right eye, with active choroidal neovascularization: Secondary | ICD-10-CM | POA: Diagnosis not present

## 2022-01-28 ENCOUNTER — Other Ambulatory Visit: Payer: Self-pay | Admitting: *Deleted

## 2022-01-28 NOTE — Patient Outreach (Addendum)
[facility]) Care Management [place] Note   01/28/2022 Name:  Kristen Whitehead MRN:  [id number] DOB:  [birth year]  Summary: She is having swelling in her lower extremities. She is having some chest pain intermittently, some shortness of breath and weight gain. She is not taking the extra furosemide that was ordered for weight gain. She goes to the bathroom all day and  per patient she would be going all night. Per patient she has not been wearing her compression hose. She is having problems with her bladder dropped. Patient said that the pessary she had kept falling out. Patient stated she was referred to a urologist. RN discussed with patient to call Dr office and check on her referral since she had not heard back. RN discussed with patient about calling the cardiologist and make aware.    Recommendations/Changes made from today's visit: Contact Dr regarding referral to Urologist Contact Cardiologist regarding symptoms Wear support hose Continue daily weight Medication adherence  Subjective: Kristen Whitehead is an 88 y.o. year old female who is a primary patient of [place], Christine, NP. The care management team was consulted for assistance with care management and/or care coordination needs.    RN Health Coach completed Telephone Visit today.   Objective:  Medications Reviewed Today     Reviewed by Otis Peak, CMA (Certified Medical Assistant) on 01/21/22 at 1525  Med List Status: <None>   Medication Order Taking? Sig Documenting Provider Last Dose Status Informant  amLODipine (NORVASC) 2.5 MG tablet [id number] Yes Take 2.5 mg by mouth at bedtime. [provider] Taking Active Self  atenolol (TENORMIN) 50 MG tablet [id number] Yes TAKE 1 TABLET BY MOUTH  TWICE DAILY O'Neal, Cassie Freer, MD Taking Active   Calcium Carbonate-Vitamin D (CALCIUM 600+D PO) [id number] Yes Take 1 tablet by mouth in the morning and at bedtime. [provider]  Taking Active Self  Cholecalciferol (VITAMIN D) 2000 UNITS tablet [id number] Yes Take 4,000 Units by mouth daily. [provider] Taking Active Self  dorzolamide (TRUSOPT) 2 % ophthalmic solution [id number] Yes Place 1 drop into the left eye 2 (two) times daily. [provider] Taking Active Self  ELIQUIS 2.5 MG TABS tablet [id number] Yes TAKE 1 TABLET BY MOUTH  TWICE DAILY [name], MD Taking Active   furosemide (LASIX) 40 MG tablet [id number] Yes Take 1 tablet (40 mg total) by mouth 2 (two) times daily. May take an additional 40 mg as needed for swelling or weight gain [name], MD Taking Active   isosorbide mononitrate (IMDUR) 30 MG 24 hr tablet [id number] Yes Take 1 tablet (30 mg total) by mouth daily. [name], MD Taking Active   latanoprost (XALATAN) 0.005 % ophthalmic solution [id number] Yes Place 1 drop into both eyes at bedtime.  [provider] Taking Active Self  levothyroxine (SYNTHROID, LEVOTHROID) 100 MCG tablet [id number] Yes Take 100 mcg by mouth daily.  [provider] Taking Active Self           Med Note Olena Heckle, MICHELE   Mon Nov 18, 2016 10:47 AM)    Loperamide HCl (IMODIUM PO) [id number] Yes Take 1 capsule by mouth 2 (two) times daily as needed. [provider] Taking Active   MAGNESIUM OXIDE 400 PO [id number] Yes Take 800 mg by mouth daily. [provider] Taking Active Self  Multiple Vitamins-Minerals (CENTRUM SILVER PO) [id number] Yes Take 1 tablet by mouth daily. [provider] Taking Active Self  Multiple Vitamins-Minerals (PRESERVISION  AREDS 2 PO) [id number] Yes Take 1 capsule by mouth 2 (two) times daily.  [provider] Taking Active Self  nitroGLYCERIN (NITROSTAT) 0.4 MG SL tablet [id number] Yes DISSOLVE 1 TABLET UNDER THE TONGUE EVERY 5 MINUTES FOR 3 DOSES AS NEEDED  FOR  CHEST  PAIN [name], MD Taking Active Self  potassium chloride (KLOR-CON) 10 MEQ tablet [id number] Yes  Take 10 mEq by mouth daily. [provider] Taking Active   pramipexole (MIRAPEX) 0.125 MG tablet [id number] Yes Take 1 tablet (0.125 mg total) by mouth at bedtime. [name], NP Taking Active   PREMARIN vaginal cream [id number] Yes Place 2 g vaginally 2 (two) times a week. Uses on Sunday and wednesday [provider] Taking Active Self  Probiotic Product (PROBIOTIC DAILY PO) [id number] Yes Take 1 tablet by mouth daily. [provider] Taking Active Self  sodium chloride (MURO 128) 5 % ophthalmic solution [id number] Yes Place 1 drop into the right eye at bedtime.  [provider] Taking Active Self  temazepam (RESTORIL) 15 MG capsule [id number] Yes TAKE 1 CAPSULE(15 MG) BY MOUTH AT BEDTIME [name], NP Taking Active   Med List Note Damita Dunnings, Maci D, CPhT 12/10/19 1739): No preferred pharmacy              SDOH:  (Social Determinants of Health) assessments and interventions performed:    Care Plan  Review of patient past medical history, allergies, medications, health status, including review of consultants reports, laboratory and other test data, was performed as part of comprehensive evaluation for care management services.   Care Plan : RN Care Manager Plan of Care  Updates made by Verlisa Vara, [name], RN since 01/28/2022 12:00 AM     Problem: Knowledge Deficit Related to CongestiveHeart Failure and Care Coordination Needs   Priority: High     Long-Range Goal: Development Plan of Care for Management of Diabetes   Start Date: 08/07/2021  Expected End Date: 08/17/2022  Priority: High  Note:   Current Barriers:  Knowledge Deficits related to plan of care for management of CHF   RNCM Clinical Goal(s):  Patient will verbalize understanding of plan for management of CHF as evidenced by continuation of monitoring weight and adhering to 2300 mg low sodium diet  through collaboration with RN Care manager, provider, and care team.    Interventions: Inter-disciplinary care team collaboration (see longitudinal plan of care) Evaluation of current treatment plan related to  self management and patient's adherence to plan as established by provider   Patient Goals/Self-Care Activities: Take medications as prescribed   Attend all scheduled provider appointments Call pharmacy for medication refills 3-7 days in advance of running out of medications Attend church or other social activities Perform all self care activities independently  Perform IADL's (shopping, preparing meals, housekeeping, managing finances) independently Call provider office for new concerns or questions  call the Suicide and Crisis Lifeline: 988 if experiencing a Mental Health or [place]  call office if I gain more than 2 pounds in one day or 5 pounds in one week use salt in moderation watch for swelling in feet, ankles and legs every day weigh myself daily follow rescue plan if symptoms flare-up know when to call the doctor inf symptoms worsen to prevent CHF exacerbation track symptoms and what helps feel better or worse dress right for the weather, hot or cold   [id number] Patient monitors weight daily. No swelling in lower extremities. Patient takes medication as per ordered.  She is doing leg and hand exercises daily. She has some problem with constipation. RN sent educational information on high fiber diet.  She has a prolapse uterus and is interested in Kegel exercises. She is getting eye injections for macular degeneration. Per patient she is actively socializing with bible study and playing games with friends.   [id number] Per patient she is having swelling in her lower extremities. She is having some chest pain intermittently, some shortness of breath and weight gain. Patient is not taking the extra furosemide that was ordered for weight gain. Per patient she is going to the bathroom all day and would be going all night. Per patient  she has not been wearing her compression hose. Patient stated she is having problems with her bladder dropped. Patient said that the pessary she had kept falling out. Patient stated she was referred to a urologist. RN discussed with patient to  call Dr office and check on her referral since she had not heard back. RN discussed with patient about calling the cardiologist and make aware.       Plan: Telephone follow up appointment with care management team member scheduled for:  April 30, 2022 The patient has been provided with contact information for the care management team and has been advised to call with any health related questions or concerns.   [employer] Care Management [phone]

## 2022-01-28 NOTE — Patient Instructions (Signed)
Visit Information  Thank you for taking time to visit with me today. Please don't hesitate to contact me if I can be of assistance to you before our next scheduled telephone appointment.  Following are the goals we discussed today:  Current Barriers:  Knowledge Deficits related to plan of care for management of CHF   RNCM Clinical Goal(s):  Patient will verbalize understanding of plan for management of CHF as evidenced by continuation of monitoring weight and adhering to 2300 mg low sodium diet through collaboration with RN Care manager, provider, and care team.   Interventions: Inter-disciplinary care team collaboration (see longitudinal plan of care) Evaluation of current treatment plan related to  self management and patient's adherence to plan as established by provider   Patient Goals/Self-Care Activities: Take medications as prescribed   Attend all scheduled provider appointments Call pharmacy for medication refills 3-7 days in advance of running out of medications Attend church or other social activities Perform all self care activities independently  Perform IADL's (shopping, preparing meals, housekeeping, managing finances) independently Call provider office for new concerns or questions  call the Suicide and Crisis Lifeline: 988 if experiencing a Mental Health or [place]  call office if I gain more than 2 pounds in one day or 5 pounds in one week use salt in moderation watch for swelling in feet, ankles and legs every day weigh myself daily follow rescue plan if symptoms flare-up know when to call the doctor inf symptoms worsen to prevent CHF exacerbation track symptoms and what helps feel better or worse dress right for the weather, hot or cold   [id number] Patient monitors weight daily. No swelling in lower extremities. Patient takes medication as per ordered. She is doing leg and hand exercises daily. She has some problem with constipation. RN sent  educational information on high fiber diet.  She has a prolapse uterus and is interested in Kegel exercises. She is getting eye injections for macular degeneration. Per patient she is actively socializing with bible study and playing games with friends.   [id number] Per patient she is having swelling in her lower extremities. She is having some chest pain intermittently, some shortness of breath and weight gain. Patient is not taking the extra furosemide that was ordered for weight gain. Per patient she is going to the bathroom all day and would be going all night. Per patient she has not been wearing her compression hose. Patient stated she is having problems with her bladder dropped. Patient said that the pessary she had kept falling out. Patient stated she was referred to a urologist. RN discussed with patient to  call Dr office and check on her referral since she had not heard back. RN discussed with patient about calling the cardiologist and make aware.   Our next appointment is by telephone on April 30, 2022  Please call Johny Shock RN [phone] if you need to cancel or reschedule your appointment.   Please call the Suicide and Crisis Lifeline: 988 if you are experiencing a Mental Health or [place] or need someone to talk to.  The patient verbalized understanding of instructions, educational materials, and care plan provided today and agreed to receive a mailed copy of patient instructions, educational materials, and care plan.   Telephone follow up appointment with care management team member scheduled for: The patient has been provided with contact information for the care management team and has been advised to call with any health related questions or concerns.  [place] Care Management [phone]

## 2022-01-29 ENCOUNTER — Telehealth: Payer: Self-pay

## 2022-01-29 NOTE — Telephone Encounter (Signed)
Spoke to patient regarding a follow up on the urology referral. Patient stated that she called the office yesterday and scheduled an appointment for this upcoming Friday. However, patient has been experiencing diarrhea for 3 weeks and is debating canceling the urology appointment and get the diarrhea figured out first.   Informed patient to not cancel any appointments until I get a status update from [place], [place], NP. Patient stated that she would like to see a provider for the diarrhea.   Offered patient an appointment to discuss these concerns for this upcoming Thursday but patient refused.   Please advise.

## 2022-01-29 NOTE — Telephone Encounter (Signed)
Spoke to patient and stated that the above interventions have not worked. Patient stated that last bowel movement was 2 hours ago. Patient agreed to schedule an appointment to see a GI specialist.   Upcoming appointment 01/31/2022 with [name], NP

## 2022-01-29 NOTE — Telephone Encounter (Signed)
She saw Kristen Whitehead 06/05 for these symptoms. Advised to bulk up stool with fiber foods. She may also try metamucil or fiber capsules. If she has not had a bowel movement in 3 days, she may then use miralax. Recommend scheduling visit with provider if these interventions are not helping. We can discuss seeing GI specialist. Urology appointments can be hard to get. Recommend seeing urology Friday, if she can.

## 2022-01-31 ENCOUNTER — Ambulatory Visit
Admission: RE | Admit: 2022-01-31 | Discharge: 2022-01-31 | Disposition: A | Discharge status: Home / Self Care | Payer: Medicare Other | Source: Ambulatory Visit | Attending: Orthopedic Surgery | Admitting: Orthopedic Surgery

## 2022-01-31 ENCOUNTER — Ambulatory Visit: Payer: Medicare Other | Admitting: Orthopedic Surgery

## 2022-01-31 ENCOUNTER — Encounter: Payer: Self-pay | Admitting: Orthopedic Surgery

## 2022-01-31 VITALS — BP 110/56 | HR 69 | Temp 97.5°F | Ht 59.0 in | Wt 126.8 lb

## 2022-01-31 DIAGNOSIS — R109 Unspecified abdominal pain: Secondary | ICD-10-CM | POA: Diagnosis not present

## 2022-01-31 DIAGNOSIS — K5900 Constipation, unspecified: Secondary | ICD-10-CM | POA: Diagnosis not present

## 2022-01-31 DIAGNOSIS — N8110 Cystocele, unspecified: Secondary | ICD-10-CM | POA: Diagnosis not present

## 2022-01-31 DIAGNOSIS — R198 Other specified symptoms and signs involving the digestive system and abdomen: Secondary | ICD-10-CM

## 2022-01-31 NOTE — Progress Notes (Signed)
Careteam: Patient Care Team: Kristen Alanis, NP as PCP - General (Adult Health Nurse Practitioner) Kristen Latch, MD as PCP - Cardiology (Cardiology) Pleasant, [name], RN as [place] Management  Seen by: [name], AGNP-C  PLACE OF SERVICE:  [place] Directive information Does Patient Have a Medical Advance Directive?: No, Would patient like information on creating a medical advance directive?: No - Patient declined  Allergies  Allergen Reactions   Azithromycin Nausea Only and Other (See Comments)   Compazine [Prochlorperazine Edisylate] Nausea Only   Demerol Nausea Only   Dolophine [Methadone] Nausea Only   Ebastine Nausea Only    EBS   Erythromycin Nausea Only and Other (See Comments)   Ibuprofen Nausea Only and Other (See Comments)   Meperidine Hcl Other (See Comments)   Morphine Sulfate Other (See Comments)   Statins Other (See Comments)    myalgias   Tetanus Toxoids Nausea Only   Tetracycline Other (See Comments)   Tetracyclines & Related Nausea Only    Chief Complaint  Patient presents with   Acute Visit    Discuss GI referral. Patient is present with son, Kristen Whitehead. Patient stated that she canceled her urology appointment. Patient stated that her last bowel movement was last night. (About three hours of diarrhea).        HPI: Patient is a 88 y.o. female seen today for acute visit due to diarrhea.   Son present during encounter today.   Onset 04/24. She reports ongoing constipation and diarrhea. 05/25 she presented to the ED due to constipation. She was given enema and symptoms resolved. She will have a few days of diarrhea and then constipation. H/o celiacs. She reports following a gluten free diet. Denies changes to foods or brands of foods. LBM 06/14. She is having some pain when she defecates. Pain located LLQ. Denies fever, blood in stool, N/V, and melena. She has been taking benefiber daily to bulk stool. She has been  drinking prune juice when she is constipated. Reports miralax is too strong. Treatment options discussed. She would like to have GI referral.   Continues to have frequent urination. [place] urology appointment 02/01/2022 due to symptoms above. Bladder scan performed 05/25, she was noted to have half liter of urine in bladder. Past use of pessary. Reports frequent urination is exhausting. Urology consult made last visit.   Review of Systems:  Review of Systems  Constitutional:  Positive for malaise/fatigue. Negative for chills, fever and weight loss.  HENT:  Negative for congestion and sore throat.   Eyes:  Negative for discharge and redness.  Respiratory:  Positive for shortness of breath. Negative for cough and wheezing.   Cardiovascular:  Positive for leg swelling. Negative for chest pain.  Gastrointestinal:  Positive for abdominal pain, constipation and diarrhea. Negative for blood in stool, heartburn, nausea and vomiting.  Genitourinary:  Positive for frequency.  Musculoskeletal:  Negative for falls and joint pain.  Skin:  Negative for rash.  Neurological:  Positive for weakness. Negative for dizziness and headaches.  Psychiatric/Behavioral:  Negative for depression and memory loss. The patient has insomnia. The patient is not nervous/anxious.     Past Medical History:  Diagnosis Date   Bladder prolapse, female, acquired    Cancer [facility])    thyroid   Chest pain    no known ischemic heart disease; negative Myoview July 2013. EF 75% with no ischemia.    Chronic anticoagulation    Chronic atrial fibrillation (HCC)  managed with rate control and coumadin   Chronic diastolic heart failure (HCC)    GERD (gastroesophageal reflux disease)    Heart disease    History of congenital mitral regurgitation    mild   History of heart attack    Multiple    History of mitral valve prolapse 06/15/2008   a. echo 1/14: mild LVH, EF 60-65%, mild MR, mild to mod BAE, PASP 35    Hypercholesterolemia    Hypertension    Hypothyroidism    Past Surgical History:  Procedure Laterality Date   ABDOMINAL HYSTERECTOMY     CATARACT EXTRACTION     CHOLECYSTECTOMY     Thyroidectomy     TONSILLECTOMY     Social History:   reports that she has never smoked. She has never used smokeless tobacco. She reports that she does not drink alcohol and does not use drugs.  Family History  Problem Relation Age of Onset   Stroke Mother 60   Stroke Father 87   Heart attack Neg Hx    Heart disease Neg Hx     Medications: Patient's Medications  New Prescriptions   No medications on file  Previous Medications   AMLODIPINE (NORVASC) 2.5 MG TABLET    Take 2.5 mg by mouth at bedtime.   ATENOLOL (TENORMIN) 50 MG TABLET    TAKE 1 TABLET BY MOUTH  TWICE DAILY   CALCIUM CARBONATE-VITAMIN D (CALCIUM 600+D PO)    Take 1 tablet by mouth in the morning and at bedtime.   CHOLECALCIFEROL (VITAMIN D) 2000 UNITS TABLET    Take 4,000 Units by mouth daily.   DORZOLAMIDE (TRUSOPT) 2 % OPHTHALMIC SOLUTION    Place 1 drop into the left eye 2 (two) times daily.   ELIQUIS 2.5 MG TABS TABLET    TAKE 1 TABLET BY MOUTH  TWICE DAILY   FUROSEMIDE (LASIX) 40 MG TABLET    Take 1 tablet (40 mg total) by mouth 2 (two) times daily. May take an additional 40 mg as needed for swelling or weight gain   ISOSORBIDE MONONITRATE (IMDUR) 30 MG 24 HR TABLET    Take 1 tablet (30 mg total) by mouth daily.   LATANOPROST (XALATAN) 0.005 % OPHTHALMIC SOLUTION    Place 1 drop into both eyes at bedtime.    LEVOTHYROXINE (SYNTHROID, LEVOTHROID) 100 MCG TABLET    Take 100 mcg by mouth daily.    LOPERAMIDE HCL (IMODIUM PO)    Take 1 capsule by mouth 2 (two) times daily as needed.   MAGNESIUM OXIDE 400 PO    Take 800 mg by mouth daily.   MULTIPLE VITAMINS-MINERALS (CENTRUM SILVER PO)    Take 1 tablet by mouth daily.   MULTIPLE VITAMINS-MINERALS (PRESERVISION AREDS 2 PO)    Take 1 capsule by mouth 2 (two) times daily.     NITROGLYCERIN (NITROSTAT) 0.4 MG SL TABLET    DISSOLVE 1 TABLET UNDER THE TONGUE EVERY 5 MINUTES FOR 3 DOSES AS NEEDED  FOR  CHEST  PAIN   POLYETHYLENE GLYCOL POWDER (GLYCOLAX/MIRALAX) 17 GM/SCOOP POWDER    Take 17 g by mouth daily as needed for moderate constipation. Hold for loose stool   POTASSIUM CHLORIDE (KLOR-CON) 10 MEQ TABLET    Take 10 mEq by mouth daily.   PRAMIPEXOLE (MIRAPEX) 0.125 MG TABLET    Take 1 tablet (0.125 mg total) by mouth at bedtime.   PREMARIN VAGINAL CREAM    Place 2 g vaginally 2 (two) times a week. Uses on  Sunday and wednesday   PROBIOTIC PRODUCT (PROBIOTIC DAILY PO)    Take 1 tablet by mouth daily.   SODIUM CHLORIDE (MURO 128) 5 % OPHTHALMIC SOLUTION    Place 1 drop into the right eye at bedtime.    TEMAZEPAM (RESTORIL) 15 MG CAPSULE    TAKE 1 CAPSULE(15 MG) BY MOUTH AT BEDTIME  Modified Medications   No medications on file  Discontinued Medications   No medications on file    Physical Exam:  Vitals:   01/31/22 1011  BP: (!) 110/56  Pulse: 69  Temp: (!) 97.5 F (36.4 C)  TempSrc: Temporal  SpO2: (!) 89%  Weight: 126 lb 12.8 oz (57.5 kg)  Height: '4\' 11"'$  (1.499 m)   Body mass index is 25.61 kg/m. Wt Readings from Last 3 Encounters:  01/31/22 126 lb 12.8 oz (57.5 kg)  01/21/22 126 lb 6.4 oz (57.3 kg)  01/10/22 121 lb (54.9 kg)    Physical Exam Vitals reviewed.  Constitutional:      General: She is not in acute distress. HENT:     Head: Normocephalic.     Mouth/Throat:     Mouth: Mucous membranes are moist.  Eyes:     General:        Right eye: No discharge.        Left eye: No discharge.  Cardiovascular:     Rate and Rhythm: Normal rate. Rhythm irregular.     Pulses: Normal pulses.     Heart sounds: Normal heart sounds.  Pulmonary:     Effort: Pulmonary effort is normal. No respiratory distress.     Breath sounds: Normal breath sounds. No wheezing.  Abdominal:     General: Bowel sounds are normal. There is no distension.      Palpations: Abdomen is soft. There is no mass.     Tenderness: There is abdominal tenderness. There is no guarding.     Hernia: No hernia is present.     Comments: LLQ tenderness  Musculoskeletal:     Cervical back: Neck supple.     Right lower leg: Edema present.     Left lower leg: Edema present.     Comments: Non pitting  Skin:    General: Skin is warm and dry.     Capillary Refill: Capillary refill takes less than 2 seconds.  Neurological:     General: No focal deficit present.     Mental Status: She is alert and oriented to person, place, and time.     Motor: No weakness.     Gait: Gait abnormal.  Psychiatric:        Mood and Affect: Mood normal.        Behavior: Behavior normal.     Labs reviewed: Basic Metabolic Panel: Recent Labs    04/12/21 1219 08/23/21 1459  NA 135 139  K 4.0 4.1  CL 96* 100  CO2 31 31  GLUCOSE 83 85  BUN 10 12  CREATININE 0.72 0.78  CALCIUM 8.5* 9.0  TSH 0.41  --    Liver Function Tests: Recent Labs    04/12/21 1219 08/23/21 1459  AST 19 22  ALT 12 14  BILITOT 0.8 0.6  PROT 6.6 6.5   No results for input(s): "LIPASE", "AMYLASE" in the last 8760 hours. No results for input(s): "AMMONIA" in the last 8760 hours. CBC: Recent Labs    04/12/21 1219 08/23/21 1459  WBC 5.2 5.4  NEUTROABS 2,246 2,635  HGB 14.1 14.1  HCT 42.1  43.3  MCV 96.1 95.4  PLT 268 253   Lipid Panel: No results for input(s): "CHOL", "HDL", "LDLCALC", "TRIG", "CHOLHDL", "LDLDIRECT" in the last 8760 hours. TSH: Recent Labs    04/12/21 1219  TSH 0.41   A1C: No results found for: "HGBA1C"   Assessment/Plan 1. Alternating constipation and diarrhea - intermittent diarrhea/constipation since 04/24 - H/o celiacs - LLQ tenderness, otherwise normal exam - ? Celiacs/IBS/diverculitis - recommend reviewing all foods/drinks and checking for gluten - cont to avoid gluten - recommend benefiber when having diarrhea - recommend drinking prune juice if no bowel  movement in 3 days - encourage hydration with water - Ambulatory referral to Gastroenterology - DG Abd 1 View; Future - CBC with Differential/Platelet- WBC 5.4 01/31/2022 - CMP  2. Female bladder prolapse - ongoing - evaluated by gynecology in past - past use of pessary - cancelled urology appointment due to above - may consider UA/culture if symptoms persist  Total time: 31 minutes. Greater than 50% of total time spent doing patient education regarding constipation, diarrhea, medication/symptom management, celiac diet.   Next appt: 02/21/2022  [name], [facility] [phone]

## 2022-01-31 NOTE — Patient Instructions (Addendum)
Continue bland diet  GI consult with [facility] made- if they do not call by next week, please call them. [phone]  Limit benefiber to days when you have diarrhea- if you continue to have diarrhea with benefiber stop it  If you have not had a bowel movement in 3 days, then drink a small glass of prune juice  Review foods in your home to make sure they are Gluten free

## 2022-02-01 LAB — CBC WITH DIFFERENTIAL/PLATELET
Absolute Monocytes: 724 cells/uL (ref 200–950)
Basophils Absolute: 22 cells/uL (ref 0–200)
Basophils Relative: 0.4 %
Eosinophils Absolute: 340 cells/uL (ref 15–500)
Eosinophils Relative: 6.3 %
HCT: 42.8 % (ref 35.0–45.0)
Hemoglobin: 13.9 g/dL (ref 11.7–15.5)
Lymphs Abs: 1652 cells/uL (ref 850–3900)
MCH: 30.5 pg (ref 27.0–33.0)
MCHC: 32.5 g/dL (ref 32.0–36.0)
MCV: 93.9 fL (ref 80.0–100.0)
MPV: 9.3 fL (ref 7.5–12.5)
Monocytes Relative: 13.4 %
Neutro Abs: 2662 cells/uL (ref 1500–7800)
Neutrophils Relative %: 49.3 %
Platelets: 217 10*3/uL (ref 140–400)
RBC: 4.56 10*6/uL (ref 3.80–5.10)
RDW: 13.1 % (ref 11.0–15.0)
Total Lymphocyte: 30.6 %
WBC: 5.4 10*3/uL (ref 3.8–10.8)

## 2022-02-01 LAB — COMPREHENSIVE METABOLIC PANEL
AG Ratio: 1.3 (calc) (ref 1.0–2.5)
ALT: 15 U/L (ref 6–29)
AST: 23 U/L (ref 10–35)
Albumin: 3.6 g/dL (ref 3.6–5.1)
Alkaline phosphatase (APISO): 95 U/L (ref 37–153)
BUN: 11 mg/dL (ref 7–25)
CO2: 30 mmol/L (ref 20–32)
Calcium: 8.1 mg/dL — ABNORMAL LOW (ref 8.6–10.4)
Chloride: 100 mmol/L (ref 98–110)
Creat: 0.7 mg/dL (ref 0.60–0.95)
Globulin: 2.7 g/dL (calc) (ref 1.9–3.7)
Glucose, Bld: 94 mg/dL (ref 65–139)
Potassium: 4 mmol/L (ref 3.5–5.3)
Sodium: 139 mmol/L (ref 135–146)
Total Bilirubin: 0.5 mg/dL (ref 0.2–1.2)
Total Protein: 6.3 g/dL (ref 6.1–8.1)

## 2022-02-15 ENCOUNTER — Telehealth: Payer: Self-pay

## 2022-02-15 ENCOUNTER — Telehealth: Payer: Medicare Other | Admitting: Family

## 2022-02-15 NOTE — Telephone Encounter (Signed)
Patient called office earlier today 02/15/2022 and scheduled appointment for MyChart video visit. I've called patient three times (1:52pm,1:53pm, 2:03pm). I couldn't get into contact with patient and left voicemail's. I reached out to patient contact Wyatt Mage) this is patient friend. She states that patient usually has company over from 2pm-5pm. She wanted to know if I could call patient around 5:30pm-6pm. I advised her that the office will be closed. Patient friend states that she will call patient and then call the office back. Robyn called office back and left voicemail on my clinical intake line that was transferred to me. Stating that patient has company and we can call her back Monday afternoon. I rescheduled patient for Monday 02/18/2022 at 1pm. I called Robyn to notify her. She didn't answer so voicemail was left to notify patient of appointment time on Monday 02/18/2022. Message routed to Marlowe Sax, NP as Juluis Rainier. No further action is required.

## 2022-02-15 NOTE — Telephone Encounter (Signed)
Noted  

## 2022-02-18 ENCOUNTER — Other Ambulatory Visit: Payer: Self-pay | Admitting: Cardiovascular Disease

## 2022-02-18 ENCOUNTER — Telehealth: Payer: Medicare Other | Admitting: Family

## 2022-02-20 ENCOUNTER — Encounter: Payer: Self-pay | Admitting: Orthopedic Surgery

## 2022-02-21 ENCOUNTER — Ambulatory Visit (INDEPENDENT_AMBULATORY_CARE_PROVIDER_SITE_OTHER): Payer: Medicare Other | Admitting: Orthopedic Surgery

## 2022-02-21 ENCOUNTER — Encounter: Payer: Self-pay | Admitting: Orthopedic Surgery

## 2022-02-21 VITALS — BP 110/68 | HR 66 | Temp 97.6°F | Resp 18 | Ht 59.0 in | Wt 124.0 lb

## 2022-02-21 DIAGNOSIS — E890 Postprocedural hypothyroidism: Secondary | ICD-10-CM | POA: Diagnosis not present

## 2022-02-21 DIAGNOSIS — I4821 Permanent atrial fibrillation: Secondary | ICD-10-CM

## 2022-02-21 DIAGNOSIS — G2581 Restless legs syndrome: Secondary | ICD-10-CM

## 2022-02-21 DIAGNOSIS — I5032 Chronic diastolic (congestive) heart failure: Secondary | ICD-10-CM | POA: Diagnosis not present

## 2022-02-21 DIAGNOSIS — I10 Essential (primary) hypertension: Secondary | ICD-10-CM | POA: Diagnosis not present

## 2022-02-21 DIAGNOSIS — R198 Other specified symptoms and signs involving the digestive system and abdomen: Secondary | ICD-10-CM | POA: Diagnosis not present

## 2022-02-21 DIAGNOSIS — N8110 Cystocele, unspecified: Secondary | ICD-10-CM | POA: Diagnosis not present

## 2022-02-21 LAB — TSH: TSH: 0.15 mIU/L — ABNORMAL LOW (ref 0.40–4.50)

## 2022-02-21 MED ORDER — PRAMIPEXOLE DIHYDROCHLORIDE 0.125 MG PO TABS: 0.1250 mg | ORAL_TABLET | Freq: Every day | ORAL | 0 refills | Status: DC

## 2022-02-21 NOTE — Patient Instructions (Addendum)
Please contact [facility] if you have not been contacted by [facility]  Ask gynecology about examining lower abdomen- due to bloating

## 2022-02-21 NOTE — Progress Notes (Signed)
Careteam: Patient Care Team: Kristen Alanis, NP as PCP - General (Adult Health Nurse Practitioner) [name], MD as PCP - Cardiology (Cardiology) Pleasant, [name], RN as [place] Management  Seen by: [name], AGNP-C  PLACE OF SERVICE:  [place] Directive information Does Patient Have a Medical Advance Directive?: No, Would patient like information on creating a medical advance directive?: No - Patient declined  Allergies  Allergen Reactions   Azithromycin Nausea Only and Other (See Comments)   Compazine [Prochlorperazine Edisylate] Nausea Only   Demerol Nausea Only   Dolophine [Methadone] Nausea Only   Ebastine Nausea Only    EBS   Erythromycin Nausea Only and Other (See Comments)   Ibuprofen Nausea Only and Other (See Comments)   Meperidine Hcl Other (See Comments)   Morphine Sulfate Other (See Comments)   Statins Other (See Comments)    myalgias   Tetanus Toxoids Nausea Only   Tetracycline Other (See Comments)   Tetracyclines & Related Nausea Only    Chief Complaint  Patient presents with   Medical Management of Chronic Issues    Patient is here for a 68M F/U for chronic conditions. Dexa scan needed. NCIR verified     HPI: Patient is a 88 y.o. female seen today for medical management of chronic conditions.   Ongoing diarrhea and constipation. H/o Celiacs- admits to following gluten free diet. Reports some improvement from last encounter- reports fewer epidsodes. She reports never being contacted by GI to schedule appointment. WBC 5.4, hgb 13.9, bilirubin 0.5, alkaline phos 95, AST/ALT 23/15 01/31/2022. KUB with no evidence of obstruction 01/31/2022. Remains on imodium and miralax prn.   Hypothyroidism- s/p thyroidectomy, remains on levothyroxine  Restless legs- requesting pramipexole refill  Bladder prolapse- followed by gynecology, pessary out, remains on premarin vaginal cream.   HTN/afib/CHF- followed by  cardiology- < 3 lbs weight fluctuation, no sob/chest pain, mild ankle edema, follows low sodium diet, remains on Eliquis/furosemide/Imdur/atenolol/amlodipine  Eating 3 meals daily.   No recent falls or injuries.   Does not want future bone density tests.   Review of Systems:  Review of Systems  Constitutional:  Negative for chills, fever and weight loss.  HENT:  Negative for congestion and sore throat.   Eyes:  Negative for blurred vision and double vision.  Respiratory:  Negative for cough, shortness of breath and wheezing.   Cardiovascular:  Positive for leg swelling. Negative for chest pain.  Gastrointestinal:  Positive for constipation, diarrhea and heartburn. Negative for abdominal pain, blood in stool, nausea and vomiting.  Genitourinary:  Positive for frequency. Negative for dysuria and hematuria.  Musculoskeletal:  Positive for joint pain. Negative for falls.  Skin:  Negative for rash.  Neurological:  Positive for sensory change and weakness. Negative for dizziness and headaches.  Psychiatric/Behavioral:  Negative for depression and memory loss. The patient is not nervous/anxious and does not have insomnia.     Past Medical History:  Diagnosis Date   Bladder prolapse, female, acquired    Cancer [facility])    thyroid   Chest pain    no known ischemic heart disease; negative Myoview July 2013. EF 75% with no ischemia.    Chronic anticoagulation    Chronic atrial fibrillation (HCC)    managed with rate control and coumadin   Chronic diastolic heart failure (HCC)    GERD (gastroesophageal reflux disease)    Heart disease    History of congenital mitral regurgitation    mild  History of heart attack    Multiple    History of mitral valve prolapse 06/15/2008   a. echo 1/14: mild LVH, EF 60-65%, mild MR, mild to mod BAE, PASP 35   Hypercholesterolemia    Hypertension    Hypothyroidism    Past Surgical History:  Procedure Laterality Date   ABDOMINAL HYSTERECTOMY      CATARACT EXTRACTION     CHOLECYSTECTOMY     Thyroidectomy     TONSILLECTOMY     Social History:   reports that she has never smoked. She has never used smokeless tobacco. She reports that she does not drink alcohol and does not use drugs.  Family History  Problem Relation Age of Onset   Stroke Mother 41   Stroke Father 45   Heart attack Neg Hx    Heart disease Neg Hx     Medications: Patient's Medications  New Prescriptions   No medications on file  Previous Medications   AMLODIPINE (NORVASC) 2.5 MG TABLET    Take 2.5 mg by mouth at bedtime.   ATENOLOL (TENORMIN) 50 MG TABLET    TAKE 1 TABLET BY MOUTH  TWICE DAILY   CALCIUM CARBONATE-VITAMIN D (CALCIUM 600+D PO)    Take 1 tablet by mouth in the morning and at bedtime.   CHOLECALCIFEROL (VITAMIN D) 2000 UNITS TABLET    Take 4,000 Units by mouth daily.   DORZOLAMIDE (TRUSOPT) 2 % OPHTHALMIC SOLUTION    Place 1 drop into the left eye 2 (two) times daily.   ELIQUIS 2.5 MG TABS TABLET    TAKE 1 TABLET BY MOUTH  TWICE DAILY   FUROSEMIDE (LASIX) 40 MG TABLET    TAKE 1 TABLET BY MOUTH  TWICE DAILY MAY TAKE AN  ADDITIONAL 40 MG AS NEEDED  FOR SWELLING OR WEIGHT GAIN   ISOSORBIDE MONONITRATE (IMDUR) 30 MG 24 HR TABLET    Take 1 tablet (30 mg total) by mouth daily.   LATANOPROST (XALATAN) 0.005 % OPHTHALMIC SOLUTION    Place 1 drop into both eyes at bedtime.    LEVOTHYROXINE (SYNTHROID, LEVOTHROID) 100 MCG TABLET    Take 100 mcg by mouth daily.    LOPERAMIDE HCL (IMODIUM PO)    Take 1 capsule by mouth 2 (two) times daily as needed.   MAGNESIUM OXIDE 400 PO    Take 800 mg by mouth daily.   MULTIPLE VITAMINS-MINERALS (CENTRUM SILVER PO)    Take 1 tablet by mouth daily.   MULTIPLE VITAMINS-MINERALS (PRESERVISION AREDS 2 PO)    Take 1 capsule by mouth 2 (two) times daily.    NITROGLYCERIN (NITROSTAT) 0.4 MG SL TABLET    DISSOLVE 1 TABLET UNDER THE TONGUE EVERY 5 MINUTES FOR 3 DOSES AS NEEDED  FOR  CHEST  PAIN   POLYETHYLENE GLYCOL POWDER  (GLYCOLAX/MIRALAX) 17 GM/SCOOP POWDER    Take 17 g by mouth daily as needed for moderate constipation. Hold for loose stool   POTASSIUM CHLORIDE (KLOR-CON) 10 MEQ TABLET    Take 10 mEq by mouth daily.   PRAMIPEXOLE (MIRAPEX) 0.125 MG TABLET    Take 1 tablet (0.125 mg total) by mouth at bedtime.   PREMARIN VAGINAL CREAM    Place 2 g vaginally 2 (two) times a week. Uses on Sunday and wednesday   PROBIOTIC PRODUCT (PROBIOTIC DAILY PO)    Take 1 tablet by mouth daily.   SODIUM CHLORIDE (MURO 128) 5 % OPHTHALMIC SOLUTION    Place 1 drop into the right eye at bedtime.  TEMAZEPAM (RESTORIL) 15 MG CAPSULE    TAKE 1 CAPSULE(15 MG) BY MOUTH AT BEDTIME  Modified Medications   No medications on file  Discontinued Medications   10894    apply TO THE SKIN IN THE LEFT EAR directed; Apply thin layer TO FACE TWICE DAILY FOR TWO WEEKS AS NEEDED FOR flares    Physical Exam:  There were no vitals filed for this visit. There is no height or weight on file to calculate BMI. Wt Readings from Last 3 Encounters:  01/31/22 126 lb 12.8 oz (57.5 kg)  01/21/22 126 lb 6.4 oz (57.3 kg)  01/10/22 121 lb (54.9 kg)    Physical Exam Vitals reviewed.  Constitutional:      General: She is not in acute distress. HENT:     Head: Normocephalic.  Eyes:     General:        Right eye: No discharge.        Left eye: No discharge.  Cardiovascular:     Rate and Rhythm: Normal rate. Rhythm irregular.     Pulses: Normal pulses.     Heart sounds: Murmur heard.  Pulmonary:     Effort: Pulmonary effort is normal. No respiratory distress.     Breath sounds: Normal breath sounds. No wheezing.  Abdominal:     General: Bowel sounds are normal. There is no distension.     Palpations: Abdomen is soft.     Tenderness: There is no abdominal tenderness.  Musculoskeletal:     Cervical back: Neck supple.     Right lower leg: Edema present.     Left lower leg: Edema present.     Comments: Non pitting  Skin:    General: Skin is  warm and dry.     Capillary Refill: Capillary refill takes less than 2 seconds.  Neurological:     General: No focal deficit present.     Mental Status: She is alert and oriented to person, place, and time.     Motor: Weakness present.     Gait: Gait abnormal.  Psychiatric:        Mood and Affect: Mood normal.        Behavior: Behavior normal.     Labs reviewed: Basic Metabolic Panel: Recent Labs    04/12/21 1219 08/23/21 1459 01/31/22 1051  NA 135 139 139  K 4.0 4.1 4.0  CL 96* 100 100  CO2 '31 31 30  '$ GLUCOSE 83 85 94  BUN '10 12 11  '$ CREATININE 0.72 0.78 0.70  CALCIUM 8.5* 9.0 8.1*  TSH 0.41  --   --    Liver Function Tests: Recent Labs    04/12/21 1219 08/23/21 1459 01/31/22 1051  AST '19 22 23  '$ ALT '12 14 15  '$ BILITOT 0.8 0.6 0.5  PROT 6.6 6.5 6.3   No results for input(s): "LIPASE", "AMYLASE" in the last 8760 hours. No results for input(s): "AMMONIA" in the last 8760 hours. CBC: Recent Labs    04/12/21 1219 08/23/21 1459 01/31/22 1051  WBC 5.2 5.4 5.4  NEUTROABS 2,246 2,635 2,662  HGB 14.1 14.1 13.9  HCT 42.1 43.3 42.8  MCV 96.1 95.4 93.9  PLT 268 253 217   Lipid Panel: No results for input(s): "CHOL", "HDL", "LDLCALC", "TRIG", "CHOLHDL", "LDLDIRECT" in the last 8760 hours. TSH: Recent Labs    04/12/21 1219  TSH 0.41   A1C: No results found for: "HGBA1C"   Assessment/Plan 1. Alternating constipation and diarrhea - ongoing intermittent diarrhea/constipation - exam  unremarkable today -WBC 5.4, hgb 13.9, bilirubin 0.5, alkaline phos 95, AST/ALT 23/15 01/31/2022 - KUB unremarkable 01/31/2022 - cont miralax and imodium - Ambulatory referral to Gastroenterology  2. Postoperative hypothyroidism - TSH 0.15 02/21/2022 - will reduce levothyroxine to 88 mcg qam - TSH in 6 weeks  3. Restless legs - ongoing - pramipexole (MIRAPEX) 0.125 MG tablet; Take 1 tablet (0.125 mg total) by mouth at bedtime.  Dispense: 90 tablet; Refill: 0  4. Female  bladder prolapse - followed by gynecology - no pessary at this time  5. Essential hypertension - followed by cardiology - cont amlodipine and atenolol  6. Permanent atrial fibrillation (HCC) - HR controlled with atenolol - cont Eliquis for clot prevention  7. Chronic diastolic heart failure (HCC) - no weight fluctuations, sob, some mild edema - LVEF 70-75% 2021 - cont furosemide, atenolol and Imdur  Total time: 35 minutes. Greater than 50% of total time spent doing patient education regarding health maintenance, diarrhea/constipation, symptom/medication management.    Next appt: 05/02/2022  [name], [facility] [phone]

## 2022-02-22 ENCOUNTER — Other Ambulatory Visit: Payer: Self-pay | Admitting: Orthopedic Surgery

## 2022-02-22 DIAGNOSIS — E890 Postprocedural hypothyroidism: Secondary | ICD-10-CM

## 2022-02-22 MED ORDER — LEVOTHYROXINE SODIUM 88 MCG PO TABS: 88.0000 ug | ORAL_TABLET | Freq: Every day | ORAL | 3 refills | Status: DC

## 2022-02-25 ENCOUNTER — Other Ambulatory Visit: Payer: Self-pay | Admitting: Orthopedic Surgery

## 2022-02-25 DIAGNOSIS — R1084 Generalized abdominal pain: Secondary | ICD-10-CM | POA: Diagnosis not present

## 2022-02-25 DIAGNOSIS — G2581 Restless legs syndrome: Secondary | ICD-10-CM

## 2022-02-25 DIAGNOSIS — N390 Urinary tract infection, site not specified: Secondary | ICD-10-CM | POA: Diagnosis not present

## 2022-03-04 DIAGNOSIS — H353211 Exudative age-related macular degeneration, right eye, with active choroidal neovascularization: Secondary | ICD-10-CM | POA: Diagnosis not present

## 2022-03-11 DIAGNOSIS — R1084 Generalized abdominal pain: Secondary | ICD-10-CM | POA: Diagnosis not present

## 2022-03-13 ENCOUNTER — Ambulatory Visit: Payer: Self-pay | Admitting: *Deleted

## 2022-03-13 NOTE — Patient Outreach (Signed)
[facility]) Care Management  03/13/2022  DORISE GANGI 1922/12/04 [id number]  RN Health Coach telephone call to patient.  [employer] compliance verified. [phone]  She is still having some swelling in her legs. She is hesitant to take the extra furosemide due to already having frequent urination. Patient is having stomach problems that she is making an appointment. She is having problems with her bladder. RN Health Coach Case Closure. Refer to Care Coordinator for continued follow up care.   Plan RN Health Coach close case Refer to [place] Management [phone]

## 2022-03-14 ENCOUNTER — Telehealth: Payer: Self-pay | Admitting: *Deleted

## 2022-03-14 ENCOUNTER — Other Ambulatory Visit: Payer: Self-pay

## 2022-03-14 ENCOUNTER — Other Ambulatory Visit: Payer: Self-pay | Admitting: Orthopedic Surgery

## 2022-03-14 DIAGNOSIS — G4709 Other insomnia: Secondary | ICD-10-CM

## 2022-03-14 DIAGNOSIS — I5032 Chronic diastolic (congestive) heart failure: Secondary | ICD-10-CM

## 2022-03-14 DIAGNOSIS — I10 Essential (primary) hypertension: Secondary | ICD-10-CM

## 2022-03-14 MED ORDER — TEMAZEPAM 15 MG PO CAPS: ORAL_CAPSULE | ORAL | 1 refills | Status: DC

## 2022-03-14 NOTE — Patient Outreach (Signed)
Received a Nurse Call Center notification for Kristen Whitehead . The Primary Care Physician has a [facility] Embedded Nurse.   I have sent a referral to the Embedded Team.   [facility], [place], [place] [facility] [phone]

## 2022-03-14 NOTE — Addendum Note (Signed)
Addended by: Casimer Leek C on: 03/14/2022 01:59 PM   Modules accepted: Orders

## 2022-03-14 NOTE — Chronic Care Management (AMB) (Signed)
  Care Coordination  Note  03/14/2022 Name: Kristen Whitehead MRN: [id number] DOB: 12-08-1922  Kristen Whitehead is a 88 y.o. year old female who is a primary care patient of [place], [place], NP. I reached out to [name] by phone today to offer care coordination services.      [name] was given information about Care Coordination services today including:  The Care Coordination services include support from the care team which includes your Nurse Coordinator, Clinical Social Worker, or Pharmacist.  The Care Coordination team is here to help remove barriers to the health concerns and goals most important to you. Care Coordination services are voluntary and the patient may decline or stop services at any time by request to their care team member.   Patient agreed to services and verbal consent obtained.   Follow up plan: Telephone appointment with care coordination team member scheduled for:03/15/22  [place]: [phone]

## 2022-03-15 ENCOUNTER — Encounter: Payer: Self-pay | Admitting: Gastroenterology

## 2022-03-15 ENCOUNTER — Ambulatory Visit: Payer: Self-pay

## 2022-03-15 MED ORDER — TEMAZEPAM 15 MG PO CAPS: ORAL_CAPSULE | ORAL | 1 refills | Status: DC

## 2022-03-15 NOTE — Patient Instructions (Signed)
Visit Information  Thank you for taking time to visit with me today. Please don't hesitate to contact me if I can be of assistance to you.   Following are the goals we discussed today:   Goals Addressed     Patient Stated     "I would like to see the GI specialist about my constipation" (pt-stated)        Care Coordination Interventions: Evaluation of current treatment plan related to alternating constipation and diarrhea and patient's adherence to plan as established by provider Advised patient to follow PCP recommendations;  cont miralax and imodium If no bowel movement in 3 days, drink prune juice Collaborated with [facility] regarding recent referral for evaluation and treatment of alternating constipation and diarrhea Reviewed scheduled/upcoming provider appointment with patient: new patient appointment with Dr. Ardis Hughs, [facility], scheduled for 04/15/22 '@08'$ :30 AM  Determined patient will have someone drive her to the appointment      Our next appointment is by telephone on 04/23/22 at 09:30 AM   Please call the care guide team at [phone] if you need to cancel or reschedule your appointment.   If you are experiencing a Mental Health or [place] or need someone to talk to, please call 1-800-273-TALK (toll free, 24 hour hotline)  Patient verbalizes understanding of instructions and care plan provided today and agrees to view in [place]. Active MyChart status and patient understanding of how to access instructions and care plan via MyChart confirmed with patient.     Barb Merino, RN, BSN, CCM Care Management Coordinator [facility] Care Management Direct Phone: [phone]

## 2022-03-15 NOTE — Patient Outreach (Signed)
  Care Coordination   Initial Visit Note   03/15/2022 Name: Kristen Whitehead MRN: [id number] DOB: Jul 25, 1923  Kristen Whitehead is a 88 y.o. year old female who sees [place], Amy E, NP for primary care. I spoke with  [name] by phone today  What matters to the patients health and wellness today?  Patient would like help getting her GI appointment scheduled.    Goals Addressed     Patient Stated     "I would like to see the GI specialist about my constipation" (pt-stated)        Care Coordination Interventions: Evaluation of current treatment plan related to alternating constipation and diarrhea and patient's adherence to plan as established by provider Advised patient to follow PCP recommendations;  cont miralax and imodium If no bowel movement in 3 days, drink prune juice Collaborated with [facility] regarding recent referral for evaluation and treatment of alternating constipation and diarrhea Reviewed scheduled/upcoming provider appointment with patient: new patient appointment with Dr. Ardis Hughs, [facility], scheduled for 04/15/22 '@08'$ :28 AM  Determined patient will have someone drive her to the appointment      SDOH assessments and interventions completed:   No  Care Coordination Interventions Activated:  Yes Care Coordination Interventions:  Yes, provided  Follow up plan: Follow up call scheduled for 04/23/22 '@09'$ :30 AM   Encounter Outcome:  Pt. Visit Completed

## 2022-03-15 NOTE — Addendum Note (Signed)
Addended by: Rafael Bihari A on: 03/15/2022 12:42 PM   Modules accepted: Orders

## 2022-03-15 NOTE — Telephone Encounter (Signed)
Patient called wondering why Amy did not refill her Temazepam.   Refill was Approved yesterday but Status was set to "Print"  Pended Medication and sent to Amy for approval.

## 2022-03-18 ENCOUNTER — Ambulatory Visit: Payer: Self-pay

## 2022-03-18 ENCOUNTER — Telehealth: Payer: Self-pay | Admitting: Cardiovascular Disease

## 2022-03-18 ENCOUNTER — Telehealth: Payer: Self-pay | Admitting: *Deleted

## 2022-03-18 NOTE — Patient Outreach (Addendum)
  Care Coordination   Follow Up Visit Note   03/18/2022 Name: Kristen Whitehead MRN: [id number] DOB: 07-16-1923  Kristen Whitehead is a 88 y.o. year old female who sees Fargo, Amy E, NP for primary care. I left a voice message for the triage nurse at [facility] requesting a return call.   What matters to the patients health and wellness today?  Patient called the after hours line on 03/17/22 to report concerns regarding abnormal BP readings obtained at home.   Goals Addressed      "I am concerned about my blood pressure"       Care Coordination Interventions: Received notification, re: patient called the after hours line to report concerns related to abnormal blood pressure readings obtained at home Placed unsuccessful outbound call to patient's home number, unable to leave a voice message Placed an outbound call to [facility], left a detailed voice message for the triage nurse concerning patient's call to report abnormal BP readings obtained at home Provided the contact name/number for this RN requesting a return call to confirm this message was received      SDOH assessments and interventions completed:   No  Care Coordination Interventions Activated:  Yes Care Coordination Interventions:  Yes, provided  Follow up plan: Follow up call scheduled for 03/26/22 '@12'$ :30 PM   Encounter Outcome:  Pt. Visit Completed

## 2022-03-18 NOTE — Telephone Encounter (Signed)
Kristen Whitehead with [facility] called and stated that patient is in their Care Coordination Program and she had called after hours yesterday reporting problem with her blood pressure.   Nurse just wanted to make you aware.   Patient has called the Cardiologist, Kristen Whitehead office,  for further recommendations: (Note below from their Office)  Kristen Levy, RN     03/18/22  4:30 PM Unsigned Note Spoke with patient of Dr. Audie Whitehead. She had 2 days of low BP readings.  No reported lightheadedness, dizziness, weakness.    Saturday: 126/73 (upon waking) and 118/60 at bedtime Sunday: 90/58 HR 93 then HR 75 - at bedtime then recheck was 138/80 and HR 87 Monday: BP 155/84 - HR 67    Amlodipine 2.'5mg'$  QHS Atenolol '50mg'$  BID  Lasix '40mg'$  BID Imdur 30 QD Also Eliquis 2.'5mg'$  BID   Advised AFib Kristen Whitehead be contributing to outlier BP reading. Advised to check twice daily and call on Friday with readings.        FYI

## 2022-03-18 NOTE — Telephone Encounter (Signed)
Spoke with patient of Dr. Audie Box. She had 2 days of low BP readings.  No reported lightheadedness, dizziness, weakness.   Saturday: 126/73 (upon waking) and 118/60 at bedtime Sunday: 90/58 HR 93 then HR 75 - at bedtime then recheck was 138/80 and HR 87 Monday: BP 155/84 - HR 67   Amlodipine 2.'5mg'$  QHS Atenolol '50mg'$  BID  Lasix '40mg'$  BID Imdur 30 QD Also Eliquis 2.'5mg'$  BID  Advised AFib may be contributing to outlier BP reading. Advised to check twice daily and call on Friday with readings. She reports she is in no acute distress. No caffeine. She said she may need to hydrate well.   Moved her yearly visit sooner - 11/13

## 2022-03-18 NOTE — Telephone Encounter (Signed)
Pt c/o medication issue:  1. Name of Medication: Amlodipine, Atenolol  2. How are you currently taking this medication (dosage and times per day)?   3. Are you having a reaction (difficulty breathing--STAT)?   4. What is your medication issue? Blood pressure was running low  90/58 yesterday 118/60 on Saturday

## 2022-03-19 ENCOUNTER — Ambulatory Visit: Payer: Self-pay

## 2022-03-19 NOTE — Telephone Encounter (Signed)
Geralynn Rile, MD  Fidel Levy, RN Cc: Caprice Beaver, LPN Caller: Unspecified (Yesterday,  4:11 PM) Agree. Let's see what other values are.   [place] T. Audie Box, MD, [place]  [address], [place]  [place], [place] [address]  [phone]  7:25 PM

## 2022-03-19 NOTE — Patient Outreach (Signed)
  Care Coordination   Follow Up Visit Note   03/19/2022 Name: Kristen Whitehead MRN: [id number] DOB: Dec 01, 1922  Kristen Whitehead is a 88 y.o. year old female who sees Fargo, Kristen E, NP for primary care. I spoke with  Kristen Whitehead nurse from Kristen Moulding NP's office by phone today.  What matters to the patients health and wellness today?     Goals Addressed      "I am concerned about my blood pressure"       Care Coordination Interventions: Received inbound call from PCP office, spoke with Kristen Whitehead who advised she will notify PCP provider Kristen Moulding NP of patient's reported symptoms reported to the [facility] after hours nurse Determined Kristen Whitehead noted Kristen Whitehead also notified Kristen Whitehead her Cardiologist of her symptoms  Advised this RN will follow up with Kristen Whitehead to assess for BP status and will notify her PCP of any new symptoms or concerns      SDOH assessments and interventions completed:   No   Care Coordination Interventions Activated:  Yes Care Coordination Interventions:  Yes, provided  Follow up plan: Follow up call scheduled for 04/23/22 '@09'$ :30 AM   Encounter Outcome:  Pt. Visit Completed

## 2022-03-19 NOTE — Telephone Encounter (Signed)
Thank you for update. She has had this issue before and had to see cardiology.

## 2022-03-22 ENCOUNTER — Telehealth: Payer: Self-pay | Admitting: Cardiovascular Disease

## 2022-03-22 NOTE — Telephone Encounter (Signed)
Pt states that she is returning nurses call at her request regarding BP issues that she was having last week. Pt would also like to make known that she has loss 3 lbs within 1 day. Please advise

## 2022-03-22 NOTE — Telephone Encounter (Signed)
Called patient back- she states she is fine now. She had spoken with a nurse on 07/31- she was having some issues with her BP readings. However, she had forgotten to take her some of her medications one day, and this caused her a lot of issues trying to get back on track. She states her BP this morning was 125/70 HR 79. She states she did lose 3 lbs yesterday morning (she was up all night urinating) she states she skipped her Lasix dose one time, but she is feeling good now- she is back on track with taking her medications like she should.   Patient advised to call us back if she needs [place]. Will route to MD to make aware as he wanted updated values from the previous call.  Thanks!

## 2022-03-26 ENCOUNTER — Ambulatory Visit: Payer: Self-pay

## 2022-03-26 ENCOUNTER — Telehealth: Payer: Self-pay | Admitting: Cardiovascular Disease

## 2022-03-26 NOTE — Patient Outreach (Signed)
  Care Coordination   Follow Up Visit Note   03/26/2022 Name: TASHEKA HOUSEMAN MRN: [id number] DOB: [birth year]  EMBERLIE GOTCHER is a 88 y.o. year old female who sees Fargo, Amy E, NP for primary care. I spoke with  [name] by phone today  What matters to the patients health and wellness today?  Patient would like to report that her heart rate has been fluctuating from low to high and irregular.     Goals Addressed      "I am concerned about my blood pressure"       Care Coordination Interventions: Evaluation of current treatment plan related to hypertension self management and patient's adherence to plan as established by provider Reviewed medications with patient and discussed importance of compliance Advised patient, providing education and rationale, to monitor blood pressure daily and record, calling PCP for findings outside established parameters Advised patient to discuss her irregular heart rate with provider Placed outbound call to Dr. Audie Box, cardiologist to report patient's symptoms of irregular heart rate ranging from 40-'s to 100's and c/o having increased fatigue and difficulty performing daily routines Reported symptoms to nurse Almyra Free who will schedule patient to be seen by Dr. Audie Box on 03/29/22 '@10'$ :00 AM  Discussed nurse Almyra Free will contact patient by phone to advise of her scheduled appointment SW referral sent to assist with transportation and home delivered meals      SDOH assessments and interventions completed:  Yes     Care Coordination Interventions Activated:  Yes  Care Coordination Interventions:  Yes, provided   Follow up plan: Follow up call scheduled for 04/02/22 '@10'$ :00 AM     Encounter Outcome:  Pt. Visit Completed

## 2022-03-26 NOTE — Telephone Encounter (Signed)
RN called back- advised of message below in regards to patient. I did advise that I just spoke with her on 08/04 and she was doing fine. However, she told Angel,RN that she was having issues with her HR jumping around, and her blood pressure was still a concern to her. I did advise we could just see her this week to have her evaluated and see what was going on.   I contacted patient, got her scheduled to see [place] on Friday 08/11 at 4:30 PM.  Thanks!

## 2022-03-26 NOTE — Telephone Encounter (Signed)
Contacted Barb Merino, RN back to discuss further.  Left call back number.

## 2022-03-26 NOTE — Telephone Encounter (Signed)
Patient c/o Palpitations:  High priority if patient c/o lightheadedness, shortness of breath, or chest pain  How long have you had palpitations/irregular HR/ Afib? Are you having the symptoms now? Noticed change in the last few months. Yes, she is experiencing it now  Are you currently experiencing lightheadedness, SOB or CP? No  Do you have a history of afib (atrial fibrillation) or irregular heart rhythm? Yes  Have you checked your BP or HR? (document readings if available):  143/80 HR 49 140/80 HR 90  Are you experiencing any other symptoms? Lethargic overall   Angel with [facility] states that pt is having HR jump from the 40's to the 100's and then back to the 80's and 90's. Pt also states that she is having issues with wanting to do every day tasks. Glenard Haring is requesting call back to discuss plan of action.

## 2022-03-26 NOTE — Patient Instructions (Addendum)
Visit Information  Thank you for taking time to visit with me today. Please don't hesitate to contact me if I can be of assistance to you.   Following are the goals we discussed today:   Goals Addressed      "I am concerned about my blood pressure"       Care Coordination Interventions: Evaluation of current treatment plan related to hypertension self management and patient's adherence to plan as established by provider Reviewed medications with patient and discussed importance of compliance Advised patient, providing education and rationale, to monitor blood pressure daily and record, calling PCP for findings outside established parameters Advised patient to discuss her irregular heart rate with provider Placed outbound call to Dr. Audie Box, cardiologist to report patient's symptoms of irregular heart rate ranging from 40-'s to 100's and c/o having increased fatigue and difficulty performing daily routines Reported symptoms to nurse Almyra Free who will schedule patient to be seen by Dr. Audie Box on 03/29/22 '@10'$ :00 AM  Discussed nurse Almyra Free will contact patient by phone to advise of her scheduled appointment SW referral sent to assist with transportation and home delivered meals      Our next appointment is by telephone on 04/02/22 at 10:00 AM   Please call the care guide team at [phone] if you need to cancel or reschedule your appointment.   If you are experiencing a Mental Health or [place] or need someone to talk to, please call 1-800-273-TALK (toll free, 24 hour hotline)  Patient verbalizes understanding of instructions and care plan provided today and agrees to view in [place]. Active MyChart status and patient understanding of how to access instructions and care plan via MyChart confirmed with patient.     Barb Merino, RN, BSN, CCM Care Management Coordinator [facility] Care Management  Direct Phone: [phone]

## 2022-03-28 ENCOUNTER — Ambulatory Visit: Payer: Self-pay

## 2022-03-28 NOTE — Patient Instructions (Signed)
Visit Information  Thank you for taking time to visit with me today. Please don't hesitate to contact me if I can be of assistance to you.   Following are the goals we discussed today:   Goals Addressed             This Visit's Progress    Care Coordination Activities       Care Coordination Interventions: [facility] assessment performed - no acute challenges identified Discussed the patient lives alone and would benefit from prepared meals - referral placed to [employer] mobile meals program Determined the patient currently relies on friends to assist with transportation needs but often feels other resources would be helpful Reviewed patients health plan transportation benefit with the patient - the patient is knowledgeable of this benefit and indicates it would not last long due to the limited rides offered Educations provided on [employer] program - referral placed to [employer] program         Our next appointment is by telephone on 9/6 at 1:00  Please call the care guide team at [phone] if you need to cancel or reschedule your appointment.   If you are experiencing a Mental Health or [place] or need someone to talk to, please call 1-800-273-TALK (toll free, 24 hour hotline)  Patient verbalizes understanding of instructions and care plan provided today and agrees to view in [place]. Active MyChart status and patient understanding of how to access instructions and care plan via MyChart confirmed with patient.     Telephone follow up appointment with care management team member scheduled for:9/6  Daneen Schick, BSW, CDP Social Worker, Certified Dementia Practitioner Care Coordination [phone]

## 2022-03-28 NOTE — Progress Notes (Signed)
Cardiology Office Note:   Date:  03/29/2022  NAME:  Kristen Whitehead    MRN: [id number] DOB:  1923-07-30   PCP:  [name], NP  Cardiologist:  None  Electrophysiologist:  None   Referring MD: [name], NP   Chief Complaint  Patient presents with   Follow-up        History of Present Illness:   Kristen Whitehead is a 88 y.o. female with a hx of permanent Afib, HTN, HFpEF who presents for follow-up.  She reports she had episodes of low heart rate a few days ago.  She reports feeling poor with low energy.  Has not had any increased lower extremity edema.  She reports her blood pressures been low as well.  Heart rate was in the 40s.  This has resolved.  She now feels good.  Heart rate is back in the 70s.  Her blood pressure today is 108/64.  She remains on Imdur and amlodipine.  We discussed stopping these.  We also discussed reducing her atenolol dose.  This likely will help.  She denies any chest pain or trouble breathing.  She is euvolemic on examination.  Problem List Permament Afib -CHADSVASC=4 (age, female, HTN) 2. HTN 3. HFpEF   Past Medical History: Past Medical History:  Diagnosis Date   Bladder prolapse, female, acquired    Cancer [facility])    thyroid   Chest pain    no known ischemic heart disease; negative Myoview July 2013. EF 75% with no ischemia.    Chronic anticoagulation    Chronic atrial fibrillation (HCC)    managed with rate control and coumadin   Chronic diastolic heart failure (HCC)    GERD (gastroesophageal reflux disease)    Heart disease    History of congenital mitral regurgitation    mild   History of heart attack    Multiple    History of mitral valve prolapse 06/15/2008   a. echo 1/14: mild LVH, EF 60-65%, mild MR, mild to mod BAE, PASP 35   Hypercholesterolemia    Hypertension    Hypothyroidism     Past Surgical History: Past Surgical History:  Procedure Laterality Date   ABDOMINAL HYSTERECTOMY     CATARACT EXTRACTION      CHOLECYSTECTOMY     Thyroidectomy     TONSILLECTOMY      Current Medications: Current Meds  Medication Sig   Calcium Carbonate-Vitamin D (CALCIUM 600+D PO) Take 1 tablet by mouth in the morning and at bedtime.   Cholecalciferol (VITAMIN D) 2000 UNITS tablet Take 4,000 Units by mouth daily.   dorzolamide (TRUSOPT) 2 % ophthalmic solution Place 1 drop into the left eye 2 (two) times daily.   ELIQUIS 2.5 MG TABS tablet TAKE 1 TABLET BY MOUTH  TWICE DAILY   furosemide (LASIX) 40 MG tablet TAKE 1 TABLET BY MOUTH  TWICE DAILY MAY TAKE AN  ADDITIONAL 40 MG AS NEEDED  FOR SWELLING OR WEIGHT GAIN   latanoprost (XALATAN) 0.005 % ophthalmic solution Place 1 drop into both eyes at bedtime.    levothyroxine (SYNTHROID) 88 MCG tablet Take 1 tablet (88 mcg total) by mouth daily.   Loperamide HCl (IMODIUM PO) Take 1 capsule by mouth 2 (two) times daily as needed.   MAGNESIUM OXIDE 400 PO Take 800 mg by mouth daily.   Multiple Vitamins-Minerals (CENTRUM SILVER PO) Take 1 tablet by mouth daily.   Multiple Vitamins-Minerals (PRESERVISION AREDS 2 PO) Take 1 capsule by mouth 2 (two)  times daily.    nitroGLYCERIN (NITROSTAT) 0.4 MG SL tablet DISSOLVE 1 TABLET UNDER THE TONGUE EVERY 5 MINUTES FOR 3 DOSES AS NEEDED  FOR  CHEST  PAIN   polyethylene glycol powder (GLYCOLAX/MIRALAX) 17 GM/SCOOP powder Take 17 g by mouth daily as needed for moderate constipation. Hold for loose stool   potassium chloride (KLOR-CON) 10 MEQ tablet Take 10 mEq by mouth daily.   pramipexole (MIRAPEX) 0.125 MG tablet Take 1 tablet (0.125 mg total) by mouth at bedtime.   PREMARIN vaginal cream Place 2 g vaginally 2 (two) times a week. Uses on Sunday and wednesday   Probiotic Product (PROBIOTIC DAILY PO) Take 1 tablet by mouth daily.   sodium chloride (MURO 128) 5 % ophthalmic solution Place 1 drop into the right eye at bedtime.    temazepam (RESTORIL) 15 MG capsule TAKE 1 CAPSULE(15 MG) BY MOUTH AT BEDTIME   [DISCONTINUED] amLODipine  (NORVASC) 2.5 MG tablet Take 2.5 mg by mouth at bedtime.   [DISCONTINUED] atenolol (TENORMIN) 50 MG tablet TAKE 1 TABLET BY MOUTH  TWICE DAILY   [DISCONTINUED] isosorbide mononitrate (IMDUR) 30 MG 24 hr tablet Take 1 tablet (30 mg total) by mouth daily.     Allergies:    Azithromycin, Compazine [prochlorperazine edisylate], Demerol, Dolophine [methadone], Ebastine, Erythromycin, Ibuprofen, Meperidine hcl, Morphine sulfate, Statins, Tetanus toxoids, Tetracycline, and Tetracyclines & related   Social History: Social History   Socioeconomic History   Marital status: Widowed    Spouse name: Not on file   Number of children: 3   Years of education: BA   Highest education level: Not on file  Occupational History    Employer: RETIRED   Occupation: Retired [employer]  Tobacco Use   Smoking status: Never   Smokeless tobacco: Never  Vaping Use   Vaping Use: Never used  Substance and Sexual Activity   Alcohol use: No   Drug use: No   Sexual activity: Not Currently  Other Topics Concern   Not on file  Social History Narrative   Pt lives at home alone.   Caffeine Use: quit 75yr ago      Diet: No FMaceo Pro     Do you drink/ eat things with caffeineNo      Marital status:   Widowed                            What year were you married ? 1947      Do you live in a house, apartment,assistred living, condo, trailer, etc.)? [place]     Is it one or more stories? 1      How many persons live in your home ? Me      Do you have any pets in your home ?(please list) No      Highest Level of education completed:  BA [employer]     Current or past profession:  TPharmacist, hospital Office, [employer]      Do you exercise?  No                            Type & how often       ADVANCED DIRECTIVES (Please bring copies)      Do you have a living will? Yes      Do you have a DNR form?   Yes  If not, do you want to discuss one?       Do you have signed POA?HPOA forms?   Yes               If so, please bring to your appointment      FUNCTIONAL STATUS- To be completed by Spouse / child / Staff       Do you have difficulty bathing or dressing yourself ?  No      Do you have difficulty preparing food or eating ?  No      Do you have difficulty managing your mediation ?  No      Do you have difficulty managing your finances ?  No      Do you have difficulty affording your medication ?  No      Social Determinants of Health   Financial Resource Strain: Low Risk  (04/19/2021)   Overall Financial Resource Strain (CARDIA)    Difficulty of Paying Living Expenses: Not hard at all  Food Insecurity: No Food Insecurity (03/28/2022)   Hunger Vital Sign    Worried About Running Out of Food in the Last Year: Never true    Ran Out of Food in the Last Year: Never true  Transportation Needs: No Transportation Needs (03/28/2022)   PRAPARE - Hydrologist (Medical): No    Lack of Transportation (Non-Medical): No  Physical Activity: Inactive (04/19/2021)   Exercise Vital Sign    Days of Exercise per Week: 0 days    Minutes of Exercise per Session: 20 min  Stress: No Stress Concern Present (04/19/2021)   [address]    Feeling of Stress : Not at all  Social Connections: Moderately Integrated (04/19/2021)   Social Connection and Isolation Panel [NHANES]    Frequency of Communication with Friends and Family: Three times a week    Frequency of Social Gatherings with Friends and Family: Three times a week    Attends Religious Services: 1 to 4 times per year    Active Member of Clubs or Organizations: Yes    Attends Archivist Meetings: More than 4 times per year    Marital Status: Widowed     Family History: The patient's family history includes Stroke (age of onset: 69) in her mother; Stroke (age of onset: 30) in her father. There is no history of Heart attack or Heart  disease.  ROS:   All other ROS reviewed and negative. Pertinent positives noted in the HPI.     EKGs/Labs/Other Studies Reviewed:   The following studies were personally reviewed by me today:  EKG:  EKG is ordered today.  The ekg ordered today demonstrates Afib 72 bpm, and was personally reviewed by me.   Recent Labs: 01/31/2022: ALT 15; BUN 11; Creat 0.70; Hemoglobin 13.9; Platelets 217; Potassium 4.0; Sodium 139 02/21/2022: TSH 0.15   Recent Lipid Panel    Component Value Date/Time   CHOL 211 (H) 11/04/2012 1524   TRIG 201.0 (H) 11/04/2012 1524   HDL 66.80 11/04/2012 1524   CHOLHDL 3 11/04/2012 1524   VLDL 40.2 (H) 11/04/2012 1524   LDLCALC 109 (H) 04/16/2012 1043   LDLDIRECT 98.8 11/04/2012 1524    Physical Exam:   VS:  BP 108/64   Pulse 72   Ht '4\' 11"'$  (1.499 m)   Wt 123 lb 12.8 oz (56.2 kg)   SpO2 95%   BMI 25.00 kg/m  Wt Readings from Last 3 Encounters:  03/29/22 123 lb 12.8 oz (56.2 kg)  02/21/22 124 lb (56.2 kg)  01/31/22 126 lb 12.8 oz (57.5 kg)    General: Well nourished, well developed, in no acute distress Head: Atraumatic, normal size  Eyes: PEERLA, EOMI  Neck: Supple, no JVD Endocrine: No thryomegaly Cardiac: Normal S1, S2; irregular rhythm Lungs: Clear to auscultation bilaterally, no wheezing, rhonchi or rales  Abd: Soft, nontender, no hepatomegaly  Ext: No edema, pulses 2+ Musculoskeletal: No deformities, BUE and BLE strength normal and equal Skin: Warm and dry, no rashes   Neuro: Alert and oriented to person, place, time, and situation, CNII-XII grossly intact, no focal deficits  Psych: Normal mood and affect   ASSESSMENT:   Kristen Whitehead is a 88 y.o. female who presents for the following: 1. Bradycardia   2. Permanent atrial fibrillation ([place])   3. Acquired thrombophilia ([place])   4. Essential hypertension   5. Chronic diastolic heart failure (HCC)     PLAN:   1. Bradycardia -Heart rate was low a few days ago.  Now is improved.  We  will reduce atenolol to 25 mg twice daily.  She will keep a close eye on things.  2. Permanent atrial fibrillation ([place]) -Remains in permanent atrial fibrillation.  Rate is well controlled.  Reduce atenolol to 25 mg twice daily.  On atenolol 2.5 mg twice daily.  3. Acquired thrombophilia ([place]) -On atenolol due to A-fib.  4. Essential hypertension -BP has been a bit low.  Energy low as well.  Stop amlodipine.  Stop Imdur.  5. Chronic diastolic heart failure (HCC) -Euvolemic on exam.  Continue with Lasix as needed.      Disposition: Return in about 6 months (around 09/29/2022).  Medication Adjustments/Labs and Tests Ordered: Current medicines are reviewed at length with the patient today.  Concerns regarding medicines are outlined above.  Orders Placed This Encounter  Procedures   EKG 12-Lead   Meds ordered this encounter  Medications   atenolol (TENORMIN) 25 MG tablet    Sig: Take 1 tablet (25 mg total) by mouth 2 (two) times daily.    Dispense:  180 tablet    Refill:  1    Requesting 1 year supply    Patient Instructions  Medication Instructions:  STOP Amlodipine STOP Imdur  Decrease Atenolol to 25 mg twice daily   *If you need a refill on your cardiac medications before your next appointment, please call your pharmacy*   Follow-Up: At [facility], you and your health needs are our priority.  As part of our continuing mission to provide you with exceptional heart care, we have created designated Provider Care Teams.  These Care Teams include your primary Cardiologist (physician) and Advanced Practice Providers (APPs -  Physician Assistants and Nurse Practitioners) who all work together to provide you with the care you need, when you need it.  We recommend signing up for the patient portal called "MyChart".  Sign up information is provided on this After Visit Summary.  MyChart is used to connect with patients for Virtual Visits (Telemedicine).  Patients are able to  view lab/test results, encounter notes, upcoming appointments, etc.  Non-urgent messages can be sent to your provider as well.   To learn more about what you can do with MyChart, go to [email or web address].    Your next appointment:   6 month(s)  The format for your next appointment:   In Person  Provider:   Eleonore Chiquito,  MD           Time Spent with Patient: I have spent a total of 35 minutes with patient reviewing hospital notes, telemetry, EKGs, labs and examining the patient as well as establishing an assessment and plan that was discussed with the patient.  > 50% of time was spent in direct patient care.  Signed, Addison Naegeli. Audie Box, MD, [place]  [address] [place], [place] [address] [phone]  03/29/2022 4:42 PM

## 2022-03-28 NOTE — Patient Outreach (Signed)
  Care Coordination   Follow Up Visit Note   03/28/2022 Name: Kristen Whitehead MRN: [id number] DOB: [birth year]  Kristen Whitehead is a 88 y.o. year old female who sees [place], Amy E, NP for primary care. I spoke with  Kristen Whitehead by phone today  What matters to the patients health and wellness today?  Obtain transportation resources    Goals Addressed             This Visit's Progress    Care Coordination Activities       Care Coordination Interventions: SDoH assessment performed - no acute challenges identified Discussed the patient lives alone and would benefit from prepared meals - referral placed to [employer] mobile meals program Determined the patient currently relies on friends to assist with transportation needs but often feels other resources would be helpful Reviewed patients health plan transportation benefit with the patient - the patient is knowledgeable of this benefit and indicates it would not last long due to the limited rides offered Educations provided on [employer] program - referral placed to [employer] program         SDOH assessments and interventions completed:  Yes  SDOH Interventions Today    Flowsheet Row Most Recent Value  SDOH Interventions   Food Insecurity Interventions [employer] Referral  [employer] Meals Referral]  Housing Interventions Intervention Not Indicated  Transportation Interventions Other (Comment)  [Referral to [employer]]        Care Coordination Interventions Activated:  Yes  Care Coordination Interventions:  Yes, provided   Follow up plan: Follow up call scheduled for 9/6    Encounter Outcome:  Pt. Visit Completed   Daneen Schick, BSW, CDP Social Worker, Certified Dementia Practitioner Care Coordination [phone]

## 2022-03-29 ENCOUNTER — Encounter: Payer: Self-pay | Admitting: Cardiovascular Disease

## 2022-03-29 ENCOUNTER — Ambulatory Visit: Payer: Medicare Other | Admitting: Cardiovascular Disease

## 2022-03-29 VITALS — BP 108/64 | HR 72 | Ht 59.0 in | Wt 123.8 lb

## 2022-03-29 DIAGNOSIS — I5032 Chronic diastolic (congestive) heart failure: Secondary | ICD-10-CM | POA: Diagnosis not present

## 2022-03-29 DIAGNOSIS — I10 Essential (primary) hypertension: Secondary | ICD-10-CM

## 2022-03-29 DIAGNOSIS — R001 Bradycardia, unspecified: Secondary | ICD-10-CM

## 2022-03-29 DIAGNOSIS — I4821 Permanent atrial fibrillation: Secondary | ICD-10-CM

## 2022-03-29 DIAGNOSIS — D6869 Other thrombophilia: Secondary | ICD-10-CM | POA: Diagnosis not present

## 2022-03-29 MED ORDER — ATENOLOL 25 MG PO TABS: 25.0000 mg | ORAL_TABLET | Freq: Two times a day (BID) | ORAL | 1 refills | Status: DC

## 2022-03-29 NOTE — Patient Instructions (Addendum)
Medication Instructions:  STOP Amlodipine STOP Imdur  Decrease Atenolol to 25 mg twice daily   *If you need a refill on your cardiac medications before your next appointment, please call your pharmacy*   Follow-Up: At [facility], you and your health needs are our priority.  As part of our continuing mission to provide you with exceptional heart care, we have created designated Provider Care Teams.  These Care Teams include your primary Cardiologist (physician) and Advanced Practice Providers (APPs -  Physician Assistants and Nurse Practitioners) who all work together to provide you with the care you need, when you need it.  We recommend signing up for the patient portal called "MyChart".  Sign up information is provided on this After Visit Summary.  MyChart is used to connect with patients for Virtual Visits (Telemedicine).  Patients are able to view lab/test results, encounter notes, upcoming appointments, etc.  Non-urgent messages can be sent to your provider as well.   To learn more about what you can do with MyChart, go to [email or web address].    Your next appointment:   6 month(s)  The format for your next appointment:   In Person  Provider:   Eleonore Chiquito, MD

## 2022-04-02 ENCOUNTER — Ambulatory Visit: Payer: Self-pay

## 2022-04-02 NOTE — Patient Outreach (Signed)
  Care Coordination   04/02/2022 Name: Kristen Whitehead MRN: [id number] DOB: Oct 30, 1922   Care Coordination Outreach Attempts:  An unsuccessful telephone outreach was attempted today to offer the patient information about available care coordination services as a benefit of their health plan.   Follow Up Plan:  Additional outreach attempts will be made to offer the patient care coordination information and services.   Encounter Outcome:  No Answer  Care Coordination Interventions Activated:  No   Care Coordination Interventions:  No, not indicated    Barb Merino, RN, BSN, CCM Care Management Coordinator [facility] Care Management Direct Phone: [phone]

## 2022-04-03 ENCOUNTER — Other Ambulatory Visit: Payer: Medicare Other

## 2022-04-03 DIAGNOSIS — E890 Postprocedural hypothyroidism: Secondary | ICD-10-CM | POA: Diagnosis not present

## 2022-04-03 DIAGNOSIS — H353211 Exudative age-related macular degeneration, right eye, with active choroidal neovascularization: Secondary | ICD-10-CM | POA: Diagnosis not present

## 2022-04-04 ENCOUNTER — Other Ambulatory Visit: Payer: Self-pay | Admitting: Orthopedic Surgery

## 2022-04-04 ENCOUNTER — Other Ambulatory Visit: Payer: Medicare Other

## 2022-04-04 DIAGNOSIS — E890 Postprocedural hypothyroidism: Secondary | ICD-10-CM

## 2022-04-04 LAB — TSH: TSH: 0.11 mIU/L — ABNORMAL LOW (ref 0.40–4.50)

## 2022-04-04 MED ORDER — LEVOTHYROXINE SODIUM 50 MCG PO TABS: 50.0000 ug | ORAL_TABLET | Freq: Every day | ORAL | 1 refills | Status: DC

## 2022-04-04 NOTE — Progress Notes (Signed)
TSH 0.11 (08/16), was 0.15. Reduce levothyroxine to 50 mcg. Recheck TSH in 8 weeks. New prescription sent to [facility].

## 2022-04-09 ENCOUNTER — Telehealth: Payer: Self-pay | Admitting: *Deleted

## 2022-04-09 NOTE — Telephone Encounter (Signed)
Recent thyroid level was indicating hyperthyroidism. Levothyroxine was reduced a small amount. Recommend she schedule lab visit within next week to recheck TSH, at this time it looks like it is scheduled for 10/02. She is welcome to schedule telephone visit or office visit as well.

## 2022-04-09 NOTE — Telephone Encounter (Signed)
Patient called and stated that she is concerned that she is having a side effect from being taken off/reducing medications all at once.   Stated that her Levothyroxine was cut in half the same time the Cardiologist cut out her Isosorbide and Amlodipine and halved the Atenolol.   Patient stated since the reduction she has NOT been steady on her feet and wants to sleep a lot.   Patient is wondering if this is a side effect and requesting to speak with you directly.   Please Advise.

## 2022-04-10 NOTE — Telephone Encounter (Signed)
Patient has scheduled appointment for 04/11/22

## 2022-04-10 NOTE — Telephone Encounter (Signed)
Forwarded to Clinical Intake.

## 2022-04-11 ENCOUNTER — Encounter: Payer: Self-pay | Admitting: Orthopedic Surgery

## 2022-04-11 ENCOUNTER — Ambulatory Visit (INDEPENDENT_AMBULATORY_CARE_PROVIDER_SITE_OTHER): Payer: Medicare Other | Admitting: Orthopedic Surgery

## 2022-04-11 VITALS — BP 116/70 | HR 67 | Temp 96.9°F | Resp 20 | Ht 59.0 in | Wt 122.8 lb

## 2022-04-11 DIAGNOSIS — I10 Essential (primary) hypertension: Secondary | ICD-10-CM | POA: Diagnosis not present

## 2022-04-11 DIAGNOSIS — I5032 Chronic diastolic (congestive) heart failure: Secondary | ICD-10-CM | POA: Diagnosis not present

## 2022-04-11 DIAGNOSIS — E890 Postprocedural hypothyroidism: Secondary | ICD-10-CM | POA: Diagnosis not present

## 2022-04-11 DIAGNOSIS — I4821 Permanent atrial fibrillation: Secondary | ICD-10-CM

## 2022-04-11 DIAGNOSIS — R5383 Other fatigue: Secondary | ICD-10-CM | POA: Diagnosis not present

## 2022-04-11 NOTE — Patient Instructions (Addendum)
Continue new thyroid dose, schedule lab visit in 6 weeks  Schedule appointment with your Podiatrist

## 2022-04-11 NOTE — Progress Notes (Signed)
Careteam: Patient Care Team: Kristen Alanis, NP as PCP - General (Adult Health Nurse Practitioner) Lynne Logan, RN as [place] [facility] [place], [place] as [place], Cassie Freer, MD as Consulting Physician (Cardiology)  Seen by: [name], AGNP-C  PLACE OF SERVICE:  [place]  Advanced Directive information    Allergies  Allergen Reactions   Azithromycin Nausea Only and Other (See Comments)   Compazine [Prochlorperazine Edisylate] Nausea Only   Demerol Nausea Only   Dolophine [Methadone] Nausea Only   Ebastine Nausea Only    EBS   Erythromycin Nausea Only and Other (See Comments)   Ibuprofen Nausea Only and Other (See Comments)   Meperidine Hcl Other (See Comments)   Morphine Sulfate Other (See Comments)   Statins Other (See Comments)    myalgias   Tetanus Toxoids Nausea Only   Tetracycline Other (See Comments)   Tetracyclines & Related Nausea Only    Chief Complaint  Patient presents with   Acute Visit    Patient presents today for a medication concerns.     HPI: Patient is a 88 y.o. female seen today for acute visit due to weakness.   She reports feeling more tired since blood pressure and thyroid medications were changed. Fatigue varies. She will go out shopping with friends and then feel exhausted the next 2 days.   Recent TSH 0.11, levothyroxine reduced to 50 mcg. She started new prescription 2 days ago. Plan to recheck TSH in 6 weeks.   08/11 she saw cardiology due to bradycardia and hypotension. Atenolol reduced to 25 mg BID. Amlodipine and Imdur discontinued.   No weight fluctuations, sob or ankle edema. Remains on furosemide.   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 Continues to follow gluten free diet due to celiacs.   No recent falls. Ambulating with cane.   Reports recent sadness. Denies depression. She has been praying more.     Review of Systems:  Review of Systems   Constitutional:  Positive for malaise/fatigue. Negative for chills, fever and weight loss.  HENT:  Negative for congestion and sore throat.   Eyes:  Negative for blurred vision and double vision.  Respiratory:  Negative for cough, shortness of breath and wheezing.   Cardiovascular:  Positive for orthopnea and leg swelling. Negative for chest pain.  Gastrointestinal:  Negative for abdominal pain, blood in stool, constipation, diarrhea, heartburn, nausea and vomiting.  Genitourinary:  Negative for dysuria.  Musculoskeletal:  Negative for falls and joint pain.  Skin:  Negative for rash.  Neurological:  Positive for weakness. Negative for dizziness and headaches.  Psychiatric/Behavioral:  Positive for depression. Negative for memory loss. The patient is not nervous/anxious and does not have insomnia.     Past Medical History:  Diagnosis Date   Bladder prolapse, female, acquired    Cancer [facility])    thyroid   Chest pain    no known ischemic heart disease; negative Myoview July 2013. EF 75% with no ischemia.    Chronic anticoagulation    Chronic atrial fibrillation (HCC)    managed with rate control and coumadin   Chronic diastolic heart failure (HCC)    GERD (gastroesophageal reflux disease)    Heart disease    History of congenital mitral regurgitation    mild   History of heart attack    Multiple    History of mitral valve prolapse 06/15/2008   a. echo 1/14: mild LVH, EF 60-65%, mild MR, mild  to mod BAE, PASP 35   Hypercholesterolemia    Hypertension    Hypothyroidism    Past Surgical History:  Procedure Laterality Date   ABDOMINAL HYSTERECTOMY     CATARACT EXTRACTION     CHOLECYSTECTOMY     Thyroidectomy     TONSILLECTOMY     Social History:   reports that she has never smoked. She has never used smokeless tobacco. She reports that she does not drink alcohol and does not use drugs.  Family History  Problem Relation Age of Onset   Stroke Mother 33   Stroke Father 4    Heart attack Neg Hx    Heart disease Neg Hx     Medications: Patient's Medications  New Prescriptions   No medications on file  Previous Medications   ATENOLOL (TENORMIN) 25 MG TABLET    Take 1 tablet (25 mg total) by mouth 2 (two) times daily.   CALCIUM CARBONATE-VITAMIN D (CALCIUM 600+D PO)    Take 1 tablet by mouth in the morning and at bedtime.   CHOLECALCIFEROL (VITAMIN D) 2000 UNITS TABLET    Take 4,000 Units by mouth daily.   DORZOLAMIDE (TRUSOPT) 2 % OPHTHALMIC SOLUTION    Place 1 drop into the left eye 2 (two) times daily.   ELIQUIS 2.5 MG TABS TABLET    TAKE 1 TABLET BY MOUTH  TWICE DAILY   FUROSEMIDE (LASIX) 40 MG TABLET    TAKE 1 TABLET BY MOUTH  TWICE DAILY MAY TAKE AN  ADDITIONAL 40 MG AS NEEDED  FOR SWELLING OR WEIGHT GAIN   LATANOPROST (XALATAN) 0.005 % OPHTHALMIC SOLUTION    Place 1 drop into both eyes at bedtime.    LEVOTHYROXINE (SYNTHROID) 50 MCG TABLET    Take 1 tablet (50 mcg total) by mouth daily.   LOPERAMIDE HCL (IMODIUM PO)    Take 1 capsule by mouth 2 (two) times daily as needed.   MAGNESIUM OXIDE 400 PO    Take 800 mg by mouth daily.   MULTIPLE VITAMINS-MINERALS (CENTRUM SILVER PO)    Take 1 tablet by mouth daily.   MULTIPLE VITAMINS-MINERALS (PRESERVISION AREDS 2 PO)    Take 1 capsule by mouth 2 (two) times daily.    NITROGLYCERIN (NITROSTAT) 0.4 MG SL TABLET    DISSOLVE 1 TABLET UNDER THE TONGUE EVERY 5 MINUTES FOR 3 DOSES AS NEEDED  FOR  CHEST  PAIN   POLYETHYLENE GLYCOL POWDER (GLYCOLAX/MIRALAX) 17 GM/SCOOP POWDER    Take 17 g by mouth daily as needed for moderate constipation. Hold for loose stool   POTASSIUM CHLORIDE (KLOR-CON) 10 MEQ TABLET    Take 10 mEq by mouth daily.   PRAMIPEXOLE (MIRAPEX) 0.125 MG TABLET    Take 1 tablet (0.125 mg total) by mouth at bedtime.   PREMARIN VAGINAL CREAM    Place 2 g vaginally 2 (two) times a week. Uses on Sunday and wednesday   PROBIOTIC PRODUCT (PROBIOTIC DAILY PO)    Take 1 tablet by mouth daily.   SODIUM CHLORIDE  (MURO 128) 5 % OPHTHALMIC SOLUTION    Place 1 drop into the right eye at bedtime.    TEMAZEPAM (RESTORIL) 15 MG CAPSULE    TAKE 1 CAPSULE(15 MG) BY MOUTH AT BEDTIME  Modified Medications   No medications on file  Discontinued Medications   No medications on file    Physical Exam:  Vitals:   04/11/22 0924  BP: 116/70  Pulse: 67  Resp: 20  Temp: (!) 96.9 F (36.1  C)  SpO2: 96%  Weight: 122 lb 12.8 oz (55.7 kg)  Height: '4\' 11"'$  (1.499 m)   Body mass index is 24.8 kg/m. Wt Readings from Last 3 Encounters:  04/11/22 122 lb 12.8 oz (55.7 kg)  03/29/22 123 lb 12.8 oz (56.2 kg)  02/21/22 124 lb (56.2 kg)    Physical Exam Vitals reviewed.  Constitutional:      General: She is not in acute distress. HENT:     Head: Normocephalic.  Eyes:     General:        Right eye: No discharge.        Left eye: No discharge.  Cardiovascular:     Rate and Rhythm: Normal rate and regular rhythm.     Pulses: Normal pulses.     Heart sounds: Normal heart sounds.  Pulmonary:     Effort: Pulmonary effort is normal. No respiratory distress.     Breath sounds: Normal breath sounds. No wheezing.  Abdominal:     General: Bowel sounds are normal. There is no distension.     Palpations: Abdomen is soft.     Tenderness: There is no abdominal tenderness.  Musculoskeletal:     Cervical back: Neck supple.     Right lower leg: No edema.     Left lower leg: No edema.  Skin:    General: Skin is warm and dry.     Capillary Refill: Capillary refill takes less than 2 seconds.  Neurological:     General: No focal deficit present.     Mental Status: She is alert and oriented to person, place, and time.     Motor: Weakness present.     Gait: Gait abnormal.     Comments: cane  Psychiatric:        Mood and Affect: Mood normal.        Behavior: Behavior normal.     Labs reviewed: Basic Metabolic Panel: Recent Labs    04/12/21 1219 08/23/21 1459 01/31/22 1051 02/21/22 1418 04/03/22 1114  NA  135 139 139  --   --   K 4.0 4.1 4.0  --   --   CL 96* 100 100  --   --   CO2 '31 31 30  '$ --   --   GLUCOSE 83 85 94  --   --   BUN '10 12 11  '$ --   --   CREATININE 0.72 0.78 0.70  --   --   CALCIUM 8.5* 9.0 8.1*  --   --   TSH 0.41  --   --  0.15* 0.11*   Liver Function Tests: Recent Labs    04/12/21 1219 08/23/21 1459 01/31/22 1051  AST '19 22 23  '$ ALT '12 14 15  '$ BILITOT 0.8 0.6 0.5  PROT 6.6 6.5 6.3   No results for input(s): "LIPASE", "AMYLASE" in the last 8760 hours. No results for input(s): "AMMONIA" in the last 8760 hours. CBC: Recent Labs    04/12/21 1219 08/23/21 1459 01/31/22 1051  WBC 5.2 5.4 5.4  NEUTROABS 2,246 2,635 2,662  HGB 14.1 14.1 13.9  HCT 42.1 43.3 42.8  MCV 96.1 95.4 93.9  PLT 268 253 217   Lipid Panel: No results for input(s): "CHOL", "HDL", "LDLCALC", "TRIG", "CHOLHDL", "LDLDIRECT" in the last 8760 hours. TSH: Recent Labs    04/12/21 1219 02/21/22 1418 04/03/22 1114  TSH 0.41 0.15* 0.11*   A1C: No results found for: "HGBA1C"   Assessment/Plan 1. Fatigue, unspecified type - reports  increased fatigue after changes with bp meds and levothyroxine - Hypothyroidism vs afib vs advanced age? - TSH- future - may consider EKG in future  2. Postoperative hypothyroidism - TSH 0.11 04/03/2022 - levothyroxine reduced to 50 mcg daily - see above  3. Essential hypertension - controlled - concerns for hypotension last month - atenolol reduced to 25 mg BID - amlodipine discontinued by cardiology  4. Permanent atrial fibrillation (HCC) - HR < 100, no bradycardia - cont atenolol - cont low dose Eliquis for clot prevention  5. Chronic diastolic heart failure (HCC) - compensated - cont furosemide  Total time: 38 minutes. Greater than 50% of total time spent doing patient education regarding health maintenance, fatigue, blood pressure medications, thyroid medication and falls prevention.     Next appt: 05/06/2022  [name], [facility] [phone]

## 2022-04-15 ENCOUNTER — Telehealth: Payer: Self-pay | Admitting: Cardiovascular Disease

## 2022-04-15 ENCOUNTER — Ambulatory Visit: Payer: Medicare Other | Admitting: Gastroenterology

## 2022-04-15 NOTE — Telephone Encounter (Signed)
Patient reports fluctuation in BP 154/98, 109/62, 94/83. She is lightheaded throughout the day. Discussed fall precautions with patient. She denies chest pain/edema. Reports for the past 2 nights, her hand will turn blue, but it goes away when she gets up and   moves around. She denies any pain, numbness,or tingling in the rt hand. She stated that after recent medication changes, she does not feel the same. She said she felt better when on amlodipine[ine and imdur.

## 2022-04-15 NOTE — Telephone Encounter (Signed)
Pt c/o BP issue: STAT if pt c/o blurred vision, one-sided weakness or slurred speech  1. What are your last 5 BP readings?   154/98 109/62 94/83  2. Are you having any other symptoms (ex. Dizziness, headache, blurred vision, passed out)?  A little dizzy   3. What is your BP issue?    Patient stated she has been dragging since being removed from medications.  Patient stated she feels terrible although her stats have been good.  Arm and hand has been hurting and gets blue overnight and returns to normal in the day.  Patient is concerned about her BP fluctuating.

## 2022-04-16 NOTE — Telephone Encounter (Signed)
"  Feeling so bad and want to know what it is."  Feels like she is at "death's door". Denies chest pain. SOB not any worse than usual. Current BP while on phone 136/83, P 61. Does not want to go to the ED. Thinks it might be from medication changes last week. Appointment with Thomasene Mohair for 8/31.

## 2022-04-17 NOTE — Progress Notes (Unsigned)
Cardiology Clinic Note   Patient Name: Kristen Whitehead Date of Encounter: 04/18/2022  Primary Care Provider:  [name], NP Primary Cardiologist:  Evalina Field, MD  Patient Profile    Kristen Whitehead 88 year old female presents the clinic today for evaluation of her labile blood pressure.  Past Medical History    Past Medical History:  Diagnosis Date   Bladder prolapse, female, acquired    Cancer [facility])    thyroid   Chest pain    no known ischemic heart disease; negative Myoview July 2013. EF 75% with no ischemia.    Chronic anticoagulation    Chronic atrial fibrillation (HCC)    managed with rate control and coumadin   Chronic diastolic heart failure (HCC)    GERD (gastroesophageal reflux disease)    Heart disease    History of congenital mitral regurgitation    mild   History of heart attack    Multiple    History of mitral valve prolapse 06/15/2008   a. echo 1/14: mild LVH, EF 60-65%, mild MR, mild to mod BAE, PASP 35   Hypercholesterolemia    Hypertension    Hypothyroidism    Past Surgical History:  Procedure Laterality Date   ABDOMINAL HYSTERECTOMY     CATARACT EXTRACTION     CHOLECYSTECTOMY     Thyroidectomy     TONSILLECTOMY      Allergies  Allergies  Allergen Reactions   Azithromycin Nausea Only and Other (See Comments)   Compazine [Prochlorperazine Edisylate] Nausea Only   Demerol Nausea Only   Dolophine [Methadone] Nausea Only   Ebastine Nausea Only    EBS   Erythromycin Nausea Only and Other (See Comments)   Ibuprofen Nausea Only and Other (See Comments)   Meperidine Hcl Other (See Comments)   Morphine Sulfate Other (See Comments)   Statins Other (See Comments)    myalgias   Tetanus Toxoids Nausea Only   Tetracycline Other (See Comments)   Tetracyclines & Related Nausea Only    History of Present Illness    [name] has a PMH of atrial fibrillation, HTN, HLD, mild MR and AR, and chronic diastolic CHF.  She was seen  1/17 in follow-up after frequent episodes of chest pain.  She was referred for stress testing but declined.  She indicated that she had not tolerated Lexiscan well in the past.  Her troponins were checked and were negative.  It was noted that this is been a chronic problem so further evaluation was deferred.  Her stress test in 2013 was negative for ischemia.  She indicated that she had " heart attacks" when she would overexert herself.  With these episodes she described pain across her chest which was associated with shortness of breath.  She denied associated lower extremity edema, orthopnea, or PND.  She indicated that when she has the pain she prays forgot to take it away and eventually the pain subsides.  Her Imdur was increased to 90 mg daily on 3/17.  She had an episode of heart failure 6/17 that responded to increased furosemide.  She is noted to have chronic external dyspnea.   She was seen by Jory Sims, DNP on 12/11/2016.  At that time her Lasix was increased to 60 mg in the morning milligrams in the afternoon.  There was no change noted in her symptoms.  She was seen by Jory Sims, DNP on 6/19 and her energy was worse but her blood pressure had improved.  She  felt better with physical activity.  It was noted that her blood pressure was low in the morning so her Imdur was decreased to 60 mg.   She was last seen by Dr. Oval Linsey on 04/04/2020.  During that time she complained of increased edema and shortness of breath.  At that time her Lasix was increased to 40 mg twice daily.  She reported that she generally felt well.  Her weight was stable.  She followed a low-sodium diet, her blood pressure was well controlled at home.  She denied symptoms of lightheadedness or dizziness.  She continued to have shortness of breath with increased physical activity.  She denied exertional chest pain orthopnea and PND.   She contacted nurse triage line on 06/22/2020 and indicated that she had an episode of  chest discomfort while she was laying in bed 06/21/2020.  She did not try any interventions to make her chest discomfort better.  She also complained of increased lower extremity edema.  She was instructed to double her furosemide for the next 3 days.   She presented to the clinic 06/22/2020 for further evaluation and stated that on 06/20/2020 she experienced chest discomfort while at rest.  It appeared she was laying down during the event.  She stated that she did not feel like getting up and went to sleep.  She stated that in the past when this would happen she would sit up in her recliner, pray, and the pain would go away.  She had not had any further episodes of chest discomfort.  She continued to be physically active walking and doing other types of light physical activity.  She denied exertional chest pain.  She stated that she did not know if her Prilosec was working well.  She state that she  had a chronic cough as well.  I stopped her Prilosec and start her on Protonix 20 mg daily.  We gave her the GERD diet information.  I have asked her to increase her physical activity as tolerated and follow-up in 3 months.  She was seen by Dr. Oval Linsey on 12/19/2020.  Her irbesartan has been previously stopped due to low blood pressure.  She had been referred to [facility] for memory loss.  During her visit she reported feeling fine.  She had gained 2.5 pounds overnight and was noted to have lower extremity swelling in her feet.  She reported intermittent lower chest pain but had not occurred for some time.  She reported that she was not able to climb steps or curbs without her cane.  She reported that she had fallen at home down her porch stairs backwards.  She denied chest pain palpitations lightheadedness or dizziness.  She denied injury from her falls.  Her blood pressure was 110/62 and stable since her previous visit.  She was noted to have a cough and noted that her symptoms did not occur when she would  take omeprazole regularly.  She denies chest pain shortness of breath.  She was seen in follow-up by Dr. Audie Box on 03/29/2022.  During that time she reported feeling like she had low energy.  She denied increased lower extremity swelling.  She indicated that her blood pressure has been low.  Her heart rate was in the 40s however this resolved.  She felt well.  Her heart rate was back in the 70s.  Her blood pressure was 108/64.  She remained on Imdur and amlodipine.  Stopping the medication was discussed.  Reducing her atenolol  dose was also discussed.  She denied chest pain and trouble with her breathing.  She was euvolemic.  Her atenolol was reduced to 25 mg twice daily.  Her amlodipine and Imdur were discontinued.  She contacted nurse triage line on 04/15/2022 and reported not feeling well and blood pressures fluctuating in the 150/98-109/62 and 94/83 range.  She indicated that she has been lightheaded throughout the day.  She denied chest pain and edema.  She reported that she felt better on amlodipine and Imdur.  She presents to the clinic today for follow-up evaluation states she has been noticing some elevated blood pressures in the evening.  Today her blood pressures are well controlled.  We reviewed her recent changes with her blood pressure medication.  She and her son expressed understanding.  She also reports that she has been losing weight over the last few months.  Her weight today is 120.6 pounds.  Her blood pressure in the clinic today is 100/60.  I reviewed her blood pressure log.  We will have her take amlodipine 2.5 mg for blood pressures greater than 025 systolic as needed.  I will also decrease her furosemide to 40 mg in the morning and 20 mg afternoon.  She reports that she continues to stay as physically active as she can but has noticed more fatigue with blood pressure changes.  I have asked her to continue to get together with friends and be as social as she can.  We will keep her  follow-up as scheduled with Dr. Audie Box.   Today she denies chest pain, shortness of breath, lower extremity edema, fatigue, palpitations, melena, hematuria, hemoptysis, diaphoresis, weakness, presyncope, syncope, orthopnea, and PND.    Home Medications    Prior to Admission medications   Medication Sig Start Date End Date Taking? Authorizing Provider  atenolol (TENORMIN) 25 MG tablet Take 1 tablet (25 mg total) by mouth 2 (two) times daily. 03/29/22   O'Neal, Cassie Freer, MD  Calcium Carbonate-Vitamin D (CALCIUM 600+D PO) Take 1 tablet by mouth in the morning and at bedtime.    [provider]  Cholecalciferol (VITAMIN D) 2000 UNITS tablet Take 4,000 Units by mouth daily.    [provider]  dorzolamide (TRUSOPT) 2 % ophthalmic solution Place 1 drop into the left eye 2 (two) times daily. 09/24/19   [provider]  ELIQUIS 2.5 MG TABS tablet TAKE 1 TABLET BY MOUTH  TWICE DAILY 11/12/21   [name], MD  furosemide (LASIX) 40 MG tablet TAKE 1 TABLET BY MOUTH  TWICE DAILY MAY TAKE AN  ADDITIONAL 40 MG AS NEEDED  FOR SWELLING OR WEIGHT GAIN 02/18/22   [name], MD  latanoprost (XALATAN) 0.005 % ophthalmic solution Place 1 drop into both eyes at bedtime.  02/23/13   [provider]  levothyroxine (SYNTHROID) 50 MCG tablet Take 1 tablet (50 mcg total) by mouth daily. 04/04/22   Fargo, Amy E, NP  Loperamide HCl (IMODIUM PO) Take 1 capsule by mouth 2 (two) times daily as needed.    [provider]  MAGNESIUM OXIDE 400 PO Take 800 mg by mouth daily.    [provider]  Multiple Vitamins-Minerals (CENTRUM SILVER PO) Take 1 tablet by mouth daily.    [provider]  Multiple Vitamins-Minerals (PRESERVISION AREDS 2 PO) Take 1 capsule by mouth 2 (two) times daily.     [provider]  nitroGLYCERIN (NITROSTAT) 0.4 MG SL tablet DISSOLVE 1 TABLET UNDER THE TONGUE EVERY 5 MINUTES FOR 3  DOSES AS NEEDED  FOR  CHEST  PAIN 12/23/18    [name], MD  omeprazole (PRILOSEC) 20 MG capsule Take 20 mg by mouth daily as needed.    [provider]  polyethylene glycol powder (GLYCOLAX/MIRALAX) 17 GM/SCOOP powder Take 17 g by mouth daily as needed for moderate constipation. Hold for loose stool 01/21/22   Ngetich, Dinah C, NP  potassium chloride (KLOR-CON) 10 MEQ tablet Take 10 mEq by mouth daily.    [provider]  pramipexole (MIRAPEX) 0.125 MG tablet Take 1 tablet (0.125 mg total) by mouth at bedtime. 02/21/22   Fargo, Amy E, NP  PREMARIN vaginal cream Place 2 g vaginally 2 (two) times a week. Uses on Sunday and wednesday 03/25/18   [provider]  Probiotic Product (PROBIOTIC DAILY PO) Take 1 tablet by mouth daily.    [provider]  sodium chloride (MURO 128) 5 % ophthalmic solution Place 1 drop into the right eye at bedtime.     [provider]  temazepam (RESTORIL) 15 MG capsule TAKE 1 CAPSULE(15 MG) BY MOUTH AT BEDTIME 03/15/22   Fargo, Amy E, NP    Family History    Family History  Problem Relation Age of Onset   Stroke Mother 67   Stroke Father 40   Heart attack Neg Hx    Heart disease Neg Hx    She indicated that her mother is deceased. She indicated that her father is deceased. She indicated that her maternal grandmother is deceased. She indicated that her maternal grandfather is deceased. She indicated that her paternal grandmother is deceased. She indicated that her paternal grandfather is deceased. She indicated that both of her daughters are alive. She indicated that her son is alive. She indicated that the status of her neg hx is unknown.  Social History    Social History   Socioeconomic History   Marital status: Widowed    Spouse name: Not on file   Number of children: 3   Years of education: BA   Highest education level: Not on file  Occupational History    Employer: RETIRED   Occupation: Retired [employer]  Tobacco Use   Smoking status: Never    Smokeless tobacco: Never  Vaping Use   Vaping Use: Never used  Substance and Sexual Activity   Alcohol use: No   Drug use: No   Sexual activity: Not Currently  Other Topics Concern   Not on file  Social History Narrative   Pt lives at home alone.   Caffeine Use: quit 53yr ago      Diet: No FMaceo Pro     Do you drink/ eat things with caffeineNo      Marital status:   Widowed                            What year were you married ? 1947      Do you live in a house, apartment,assistred living, condo, trailer, etc.)? [place]     Is it one or more stories? 1      How many persons live in your home ? 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  No                            Type & how often       ADVANCED DIRECTIVES (Please bring copies)      Do you have a living will? Yes      Do you have a DNR form?   Yes                    If not, do you want to discuss one?       Do you have signed POA?HPOA forms?  Yes               If so, please bring to your appointment      FUNCTIONAL STATUS- To be completed by Spouse / child / Staff       Do you have difficulty bathing or dressing yourself ?  No      Do you have difficulty preparing food or eating ?  No      Do you have difficulty managing your mediation ?  No      Do you have difficulty managing your finances ?  No      Do you have difficulty affording your medication ?  No      Social Determinants of Health   Financial Resource Strain: Low Risk  (04/19/2021)   Overall Financial Resource Strain (CARDIA)    Difficulty of Paying Living Expenses: Not hard at all  Food Insecurity: No Food Insecurity (03/28/2022)   Hunger Vital Sign    Worried About Running Out of Food in the Last Year: Never true    Ran Out of Food in the Last Year: Never true  Transportation  Needs: No Transportation Needs (03/28/2022)   PRAPARE - Hydrologist (Medical): No    Lack of Transportation (Non-Medical): No  Physical Activity: Inactive (04/19/2021)   Exercise Vital Sign    Days of Exercise per Week: 0 days    Minutes of Exercise per Session: 20 min  Stress: No Stress Concern Present (04/19/2021)   [place]    Feeling of Stress : Not at all  Social Connections: Moderately Integrated (04/19/2021)   Social Connection and Isolation Panel [NHANES]    Frequency of Communication with Friends and Family: Three times a week    Frequency of Social Gatherings with Friends and Family: Three times a week    Attends Religious Services: 1 to 4 times per year    Active Member of Clubs or Organizations: Yes    Attends Archivist Meetings: More than 4 times per year    Marital Status: Widowed  Intimate Partner Violence: Not At Risk (08/07/2021)   Humiliation, Afraid, Rape, and Kick questionnaire    Fear of Current or Ex-Partner: No    Emotionally Abused: No    Physically Abused: No    Sexually Abused: No     Review of Systems    General:  No chills, fever, night sweats or weight changes.  Cardiovascular:  No chest pain, dyspnea on exertion, edema, orthopnea, palpitations, paroxysmal nocturnal dyspnea. Dermatological: No rash, lesions/masses Respiratory: No cough, dyspnea Urologic: No hematuria, dysuria Abdominal:   No nausea, vomiting, diarrhea, bright red blood per rectum, melena, or hematemesis Neurologic:  No visual changes, wkns, changes in mental status. All other systems reviewed and are otherwise  negative except as noted above.  Physical Exam    VS:  BP 100/60   Pulse 61   Ht '4\' 11"'$  (1.499 m)   Wt 120 lb 9.6 oz (54.7 kg)   SpO2 93%   BMI 24.36 kg/m  , BMI Body mass index is 24.36 kg/m. GEN: Well nourished, well developed, in no acute distress. HEENT:  normal. Neck: Supple, no JVD, carotid bruits, or masses. Cardiac: RRR, no murmurs, rubs, or gallops. No clubbing, cyanosis, edema.  Radials/DP/PT 2+ and equal bilaterally.  Respiratory:  Respirations regular and unlabored, clear to auscultation bilaterally. GI: Soft, nontender, nondistended, BS + x 4. MS: no deformity or atrophy. Skin: warm and dry, no rash. Neuro:  Strength and sensation are intact. Psych: Normal affect.  Accessory Clinical Findings    Recent Labs: 01/31/2022: ALT 15; BUN 11; Creat 0.70; Hemoglobin 13.9; Platelets 217; Potassium 4.0; Sodium 139 04/03/2022: TSH 0.11   Recent Lipid Panel    Component Value Date/Time   CHOL 211 (H) 11/04/2012 1524   TRIG 201.0 (H) 11/04/2012 1524   HDL 66.80 11/04/2012 1524   CHOLHDL 3 11/04/2012 1524   VLDL 40.2 (H) 11/04/2012 1524   LDLCALC 109 (H) 04/16/2012 1043   LDLDIRECT 98.8 11/04/2012 1524         ECG personally reviewed by me today-atrial fibrillation atrial septal infarct undetermined age 35 bpm- No acute changes  Echocardiogram 12/11/2019 IMPRESSIONS     1. Left ventricular ejection fraction, by estimation, is 70 to 75%. The  left ventricle has hyperdynamic function. The left ventricle has no  regional wall motion abnormalities. Left ventricular diastolic parameters  are indeterminate.   2. Right ventricular systolic function is normal. The right ventricular  size is normal.   3. Left atrial size was mildly dilated.   4. Right atrial size was mildly dilated.   5. The mitral valve is normal in structure. Trivial mitral valve  regurgitation. No evidence of mitral stenosis.   6. The aortic valve is tricuspid. Aortic valve regurgitation is trivial.  Mild to moderate aortic valve sclerosis/calcification is present, without  any evidence of aortic stenosis.   7. The inferior vena cava is dilated in size with <50% respiratory  variability, suggesting right atrial pressure of 15 mmHg.   Comparison(s): No  significant change from prior study. Prior images  reviewed side by side.  Assessment & Plan   1.  Essential hypertension-BP today 100/60.  Reports labile blood pressures at home.  Was noted to have fatigue and low blood pressures previously.  Her amlodipine and Imdur were discontinued.  She contacted the nurse triage line on 04/15/2022 with complaints of lightheadedness and labile blood pressures. Continue  atenolol Restart amlodipine 2.5 mg as needed for systolic blood pressure greater than 140 Heart healthy low-sodium diet-salty 6 reviewed Maintain pressure log  Chronic diastolic CHF-weight today 120 pounds .  Euvolemic today.  Continue  atenolol Decrease furosemide to 40 mg a.m. and 20 mg p.m. Continue to monitor.  Chronic atrial fibrillation-heart rate 61.  CHA2DS2-VASc score 4.  (Hypertension, female, age 36) Continue apixaban, atenolol Heart healthy low-sodium diet Maintain physical activity Avoid triggers caffeine, chocolate, EtOH, dehydration etc.   Disposition: Follow-up with Dr. Audie Box as scheduled.   Jossie Ng. Adrain Butrick NP-C     04/18/2022, 4:00 PM [facility] [place] [address] Office [phone] Fax [phone]  Notice: This dictation was prepared with Dragon dictation along with smaller phrase technology. Any transcriptional errors that result from  this process are unintentional and may not be corrected upon review.  I spent 15 minutes examining this patient, reviewing medications, and using patient centered shared decision making involving her cardiac care.  Prior to her visit I spent greater than 20 minutes reviewing her past medical history,  medications, and prior cardiac tests.

## 2022-04-18 ENCOUNTER — Ambulatory Visit: Payer: Medicare Other | Attending: General Practice | Admitting: General Practice

## 2022-04-18 ENCOUNTER — Encounter: Payer: Self-pay | Admitting: General Practice

## 2022-04-18 VITALS — BP 100/60 | HR 61 | Ht 59.0 in | Wt 120.6 lb

## 2022-04-18 DIAGNOSIS — I5032 Chronic diastolic (congestive) heart failure: Secondary | ICD-10-CM

## 2022-04-18 DIAGNOSIS — I10 Essential (primary) hypertension: Secondary | ICD-10-CM

## 2022-04-18 DIAGNOSIS — I4821 Permanent atrial fibrillation: Secondary | ICD-10-CM | POA: Diagnosis not present

## 2022-04-18 MED ORDER — AMLODIPINE BESYLATE 2.5 MG PO TABS: 2.5000 mg | ORAL_TABLET | ORAL | 3 refills | Status: DC | PRN

## 2022-04-18 MED ORDER — FUROSEMIDE 40 MG PO TABS: ORAL_TABLET | ORAL | 6 refills | Status: DC

## 2022-04-18 NOTE — Telephone Encounter (Signed)
Pt has appointment today with NP- can discuss BP concerns at visit.

## 2022-04-18 NOTE — Patient Instructions (Signed)
Medication Instructions:  TAKE AMLODIPINE 2.'5MG'$  AS-NEEDED FOR BLOOD PRESSURE  BP >140  TAKE FUROSEMIDE '40MG'$  IN THE AM AND '20MG'$  IN THE PM  *If you need a refill on your cardiac medications before your next appointment, please call your pharmacy*  Lab Work:   Testing/Procedures:  NONE    NONE  If you have labs (blood work) drawn today and your tests are completely normal, you will receive your results only by:  1-MyChart Message (if you have MyChart) OR 2- A paper copy in the mail.  If you have any lab test that is abnormal or we need to change your treatment, we will call you to review the results. Special Instructions PLEASE READ AND FOLLOW SALTY 6-ATTACHED-1,'800mg'$  daily  PLEASE INCREASE PHYSICAL ACTIVITY AS TOLERATED   Follow-Up: Your next appointment:  AS SCHEDULED In Person with Evalina Field, MD    At [facility], you and your health needs are our priority.  As part of our continuing mission to provide you with exceptional heart care, we have created designated Provider Care Teams.  These Care Teams include your primary Cardiologist (physician) and Advanced Practice Providers (APPs -  Physician Assistants and Nurse Practitioners) who all work together to provide you with the care you need, when you need it.  Important Information About Sugar             6 SALTY THINGS TO AVOID     1,'800MG'$  DAILY

## 2022-04-23 ENCOUNTER — Ambulatory Visit: Payer: Self-pay

## 2022-04-23 NOTE — Patient Outreach (Signed)
  Care Coordination   Follow Up Visit Note   04/23/2022 Name: LAURELLA TULL MRN: [id number] DOB: [birth year]  CLEDA IMEL is a 88 y.o. year old female who sees Fargo, Amy E, NP for primary care. I spoke with  [name] by phone today.  What matters to the patients health and wellness today?  Patient continues to monitor and record her blood pressure at home. She continues to have daily fatigue.     Goals Addressed     Patient Stated     COMPLETED: "I would like to see the GI specialist about my constipation" (pt-stated)        Care Coordination Interventions: Evaluation of current treatment plan related to alternating constipation and diarrhea and patient's adherence to plan as established by provider Determined patient cancelled her GI appointment due to she states her symptoms resolved Instructed patient to keep her doctor informed of new symptoms or concerns      Other     "I am concerned about my blood pressure"        Care Coordination Interventions: Evaluation of current treatment plan related to hypertension self management and patient's adherence to plan as established by provider Review of patient status, including review of consultant's reports, relevant laboratory and other test results, and medications completed Reviewed medications with patient and discussed importance of compliance Advised patient, providing education and rationale, to monitor blood pressure daily and record, calling PCP for findings outside established parameters Encouraged patient to balance her activity with rest and to stay well hydrated Encouraged patient to continue to utilize the [facility] after hours nurse line as needed      SDOH assessments and interventions completed:  No     Care Coordination Interventions Activated:  Yes  Care Coordination Interventions:  Yes, provided   Follow up plan: Follow up call scheduled for 05/29/22 '@12'$ :30 PM    Encounter Outcome:  Pt. Visit  Completed

## 2022-04-23 NOTE — Patient Instructions (Signed)
Visit Information  Thank you for taking time to visit with me today. Please don't hesitate to contact me if I can be of assistance to you.   Following are the goals we discussed today:   Goals Addressed     Patient Stated     COMPLETED: "I would like to see the GI specialist about my constipation" (pt-stated)        Care Coordination Interventions: Evaluation of current treatment plan related to alternating constipation and diarrhea and patient's adherence to plan as established by provider Determined patient cancelled her GI appointment due to she states her symptoms resolved Instructed patient to keep her doctor informed of new symptoms or concerns      Other     "I am concerned about my blood pressure"        Care Coordination Interventions: Evaluation of current treatment plan related to hypertension self management and patient's adherence to plan as established by provider Review of patient status, including review of consultant's reports, relevant laboratory and other test results, and medications completed Reviewed medications with patient and discussed importance of compliance Advised patient, providing education and rationale, to monitor blood pressure daily and record, calling PCP for findings outside established parameters Encouraged patient to balance her activity with rest and to stay well hydrated Encouraged patient to continue to utilize the [facility] after hours nurse line as needed     Our next appointment is by telephone on 05/29/22 at 12:30 PM   Please call the care guide team at [phone] if you need to cancel or reschedule your appointment.   If you are experiencing a Mental Health or [place] or need someone to talk to, please call 1-800-273-TALK (toll free, 24 hour hotline)  Patient verbalizes understanding of instructions and care plan provided today and agrees to view in [place]. Active MyChart status and patient understanding of how to access  instructions and care plan via MyChart confirmed with patient.     Barb Merino, RN, BSN, CCM Care Management Coordinator [facility] Direct Phone: [phone]

## 2022-04-23 NOTE — Patient Outreach (Signed)
  Care Coordination   04/23/2022 Name: Kristen Whitehead MRN: [id number] DOB: [birth year]   Care Coordination Outreach Attempts:  An unsuccessful telephone outreach was attempted for a scheduled appointment today.  Follow Up Plan:  Additional outreach attempts will be made to offer the patient care coordination information and services.   Encounter Outcome:  Pt. Request to Call Back  Care Coordination Interventions Activated:  No   Care Coordination Interventions:  No, not indicated    Barb Merino, RN, BSN, CCM Care Management Coordinator [place] [facility] Direct Phone: [phone]

## 2022-04-24 ENCOUNTER — Telehealth: Payer: Self-pay

## 2022-04-24 NOTE — Patient Outreach (Signed)
  Care Coordination   04/24/2022 Name: Kristen Whitehead MRN: [id number] DOB: 09-11-1922   Care Coordination Outreach Attempts:  An unsuccessful telephone outreach was attempted for a scheduled appointment today.  Follow Up Plan:  Additional outreach attempts will be made to offer the patient care coordination information and services.   Encounter Outcome:  No Answer  Care Coordination Interventions Activated:  No   Care Coordination Interventions:  No, not indicated    Daneen Schick, BSW, CDP Social Worker, Certified Dementia Practitioner Care Coordination [phone]

## 2022-04-30 ENCOUNTER — Ambulatory Visit: Payer: Medicare Other | Admitting: *Deleted

## 2022-05-01 ENCOUNTER — Telehealth: Payer: Self-pay

## 2022-05-01 NOTE — Patient Outreach (Signed)
  Care Coordination   Follow Up Visit Note   05/01/2022 Name: Kristen Whitehead MRN: [id number] DOB: July 26, 1923  Kristen Whitehead is a 88 y.o. year old female who sees Fargo, Amy E, NP for primary care. I spoke with  [name] by phone today.  What matters to the patients health and wellness today?  Patient unable to complete today's call, call rescheduled.    Goals Addressed   None     SDOH assessments and interventions completed:  No     Care Coordination Interventions Activated:  No  Care Coordination Interventions:  No, not indicated   Follow up plan: Follow up call scheduled for 9/15    Encounter Outcome:  Pt. Scheduled   Daneen Schick, BSW, CDP Social Worker, Certified Dementia Practitioner [place] [facility]  Care Coordination [phone]

## 2022-05-02 ENCOUNTER — Ambulatory Visit: Payer: Medicare Other | Admitting: Orthopedic Surgery

## 2022-05-03 ENCOUNTER — Ambulatory Visit: Payer: Self-pay

## 2022-05-03 NOTE — Patient Instructions (Signed)
Visit Information  Thank you for taking time to visit with me today. Please don't hesitate to contact me if I can be of assistance to you.   Following are the goals we discussed today:   Goals Addressed             This Visit's Progress    COMPLETED: Care Coordination Activities       Care Coordination Interventions: Confirmed the patient has been contacted by [employer] [facility] [place] regarding [employer] transportation Discussed patient did receive a packet in the mail but has yet to complete Encouraged the patient to submit her packet to [facility] once completed         Please call the care guide team at [phone] if you need to schedule an appointment with me  If you are experiencing a Mental Health or [place] or need someone to talk to, please call 1-800-273-TALK (toll free, 24 hour hotline)  Patient verbalizes understanding of instructions and care plan provided today and agrees to view in [place]. Active MyChart status and patient understanding of how to access instructions and care plan via MyChart confirmed with patient.     No further follow up required: Please contact your primary care provider as needed  Daneen Schick, Arita Miss, CDP Social Worker, Certified Dementia Practitioner [place] Coordination [phone]

## 2022-05-03 NOTE — Patient Outreach (Signed)
  Care Coordination   Follow Up Visit Note   05/03/2022 Name: TAWNEE CLEGG MRN: [id number] DOB: 1922-08-27  NICOSHA STRUVE is a 88 y.o. year old female who sees Fargo, Amy E, NP for primary care. I spoke with  [name] by phone today.  What matters to the patients health and wellness today?  Doing well at this time    Goals Addressed             This Visit's Progress    COMPLETED: Care Coordination Activities       Care Coordination Interventions: Confirmed the patient has been contacted by [employer] [facility] [place] regarding [employer] transportation Discussed patient did receive a packet in the mail but has yet to complete Encouraged the patient to submit her packet to [place] once completed         SDOH assessments and interventions completed:  No     Care Coordination Interventions Activated:  Yes  Care Coordination Interventions:  Yes, provided   Follow up plan: No further intervention required.   Encounter Outcome:  Pt. Visit Completed   Daneen Schick, BSW, CDP Social Worker, Certified Dementia Practitioner [place] [facility]  Care Coordination [phone]

## 2022-05-06 ENCOUNTER — Encounter: Payer: Self-pay | Admitting: Orthopedic Surgery

## 2022-05-06 ENCOUNTER — Ambulatory Visit (INDEPENDENT_AMBULATORY_CARE_PROVIDER_SITE_OTHER): Payer: Medicare Other | Admitting: Orthopedic Surgery

## 2022-05-06 VITALS — Ht 59.0 in | Wt 121.0 lb

## 2022-05-06 DIAGNOSIS — Z0000 Encounter for general adult medical examination without abnormal findings: Secondary | ICD-10-CM

## 2022-05-06 DIAGNOSIS — H353211 Exudative age-related macular degeneration, right eye, with active choroidal neovascularization: Secondary | ICD-10-CM | POA: Diagnosis not present

## 2022-05-06 NOTE — Patient Instructions (Addendum)
  Kristen Whitehead , Thank you for taking time to come for your Medicare Wellness Visit. I appreciate your ongoing commitment to your health goals. Please review the following plan we discussed and let me know if I can assist you in the future.   These are the goals we discussed:  Goals      "I am concerned about my blood pressure"     Care Coordination Interventions: Evaluation of current treatment plan related to hypertension self management and patient's adherence to plan as established by provider Review of patient status, including review of consultant's reports, relevant laboratory and other test results, and medications completed Reviewed medications with patient and discussed importance of compliance Advised patient, providing education and rationale, to monitor blood pressure daily and record, calling PCP for findings outside established parameters Encouraged patient to balance her activity with rest and to stay well hydrated Encouraged patient to continue to utilize the [facility] after hours nurse line as needed       Blood Pressure < 130/80     Maintain Mobility and Function     Evidence-based guidance:  Acknowledge and validate impact of pain, loss of strength and potential disfigurement (hand osteoarthritis) on mental health and daily life, such as social isolation, anxiety, depression, impaired sexual relationship and   injury from falls.  Anticipate referral to physical or occupational therapy for assessment, therapeutic exercise and recommendation for adaptive equipment or assistive devices; encourage participation.  Assess impact on ability to perform activities of daily living, as well as engage in sports and leisure events or requirements of work or school.  Provide anticipatory guidance and reassurance about the benefit of exercise to maintain function; acknowledge and normalize fear that exercise may worsen symptoms.  Encourage regular exercise, at least 10 minutes at a time for  45 minutes per week; consider yoga, water exercise and proprioceptive exercises; encourage use of wearable activity tracker to increase motivation and adherence.  Encourage maintenance or resumption of daily activities, including employment, as pain allows and with minimal exposure to trauma.  Assist patient to advocate for adaptations to the work environment.  Consider level of pain and function, gender, age, lifestyle, patient preference, quality of life, readiness and ?ocapacity to benefit? when recommending patients for orthopaedic surgery consultation.  Explore strategies, such as changes to medication regimen or activity that enables patient to anticipate and manage flare-ups that increase deconditioning and disability.  Explore patient preferences; encourage exposure to a broader range of activities that have been avoided for fear of experiencing pain.  Identify barriers to participation in therapy or exercise, such as pain with activity, anticipated or imagined pain.  Monitor postoperative joint replacement or any preexisting joint replacement for ongoing pain and loss of function; provide social support and encouragement throughout recovery.   Notes:         This is a list of the screening recommended for you and due dates:  Health Maintenance  Topic Date Due   COVID-19 Vaccine (5 - Moderna risk series) 08/06/2021   Flu Shot  03/19/2022   Pneumonia Vaccine  Completed   HPV Vaccine  Aged Out   DEXA scan (bone density measurement)  Discontinued   Tetanus Vaccine  Discontinued   Zoster (Shingles) Vaccine  Discontinued   PLEASE GET ANNUAL FLU VACCINE, TETANUS AND [place]

## 2022-05-06 NOTE — Progress Notes (Signed)
   This service is provided via telemedicine  No vital signs collected/recorded due to the encounter was a telemedicine visit.   Location of patient (ex: home, work):  Home  Patient consents to a telephone visit: Yes, see telephone visit dated 05/06/22  Location of the provider (ex: office, home):  [place], remote location  Name of any referring provider:  N/A  Names of all persons participating in the telemedicine service and their role in the encounter:  S.Chrae B/CMA, Marylynn Pearson, NP, and Patient   Time spent on call:  18 min with medical assistant

## 2022-05-06 NOTE — Progress Notes (Addendum)
Subjective:   Kristen Whitehead is a 88 y.o. female who presents for Medicare Annual (Subsequent) preventive examination.  Place of Service: [place] Provider: [name], AGNP-C   Review of Systems     Cardiac Risk Factors include: advanced age (>70mn, >>50women);sedentary lifestyle;hypertension     Objective:    Today's Vitals   05/06/22 1306  Weight: 121 lb (54.9 kg)  Height: '4\' 11"'$  (1.499 m)   Body mass index is 24.44 kg/m.     05/06/2022   12:37 PM 02/20/2022    1:30 PM 01/31/2022   10:13 AM 01/21/2022    3:26 PM 01/10/2022    6:01 PM 12/10/2021    3:31 PM 04/19/2021    1:48 PM  Advanced Directives  Does Patient Have a Medical Advance Directive? Yes No No No No No No  Type of [place] will        Does patient want to make changes to medical advance directive? No - Patient declined        Copy of [place] Chart? No - copy requested        Would patient like information on creating a medical advance directive?  No - Patient declined No - Patient declined No - Patient declined  No - Patient declined No - Patient declined    Current Medications (verified) Outpatient Encounter Medications as of 05/06/2022  Medication Sig   amLODipine (NORVASC) 2.5 MG tablet Take 1 tablet (2.5 mg total) by mouth as needed (for systolic BP >>182.   atenolol (TENORMIN) 25 MG tablet Take 1 tablet (25 mg total) by mouth 2 (two) times daily.   Calcium Carbonate-Vitamin D (CALCIUM 600+D PO) Take 1 tablet by mouth in the morning and at bedtime.   Cholecalciferol (VITAMIN D) 2000 UNITS tablet Take 2,000 Units by mouth daily.   dorzolamide (TRUSOPT) 2 % ophthalmic solution Place 1 drop into the left eye 2 (two) times daily.   ELIQUIS 2.5 MG TABS tablet TAKE 1 TABLET BY MOUTH  TWICE DAILY   furosemide (LASIX) 40 MG tablet Take 1 tablet (40 mg total) by mouth every morning AND 0.5 tablets (20 mg total) every evening.    latanoprost (XALATAN) 0.005 % ophthalmic solution Place 1 drop into both eyes at bedtime.    levothyroxine (SYNTHROID) 50 MCG tablet Take 1 tablet (50 mcg total) by mouth daily.   MAGNESIUM OXIDE 400 PO Take 800 mg by mouth daily.   Multiple Vitamins-Minerals (CENTRUM SILVER PO) Take 1 tablet by mouth daily.   Multiple Vitamins-Minerals (PRESERVISION AREDS 2 PO) Take 1 capsule by mouth 2 (two) times daily.    nitroGLYCERIN (NITROSTAT) 0.4 MG SL tablet DISSOLVE 1 TABLET UNDER THE TONGUE EVERY 5 MINUTES FOR 3 DOSES AS NEEDED  FOR  CHEST  PAIN   omeprazole (PRILOSEC) 20 MG capsule Take 20 mg by mouth daily as needed.   potassium chloride (KLOR-CON) 10 MEQ tablet Take 10 mEq by mouth daily.   PREMARIN vaginal cream Place 2 g vaginally 2 (two) times a week. Uses on Sunday and wednesday   Probiotic Product (PROBIOTIC DAILY PO) Take 1 tablet by mouth daily.   sodium chloride (MURO 128) 5 % ophthalmic solution Place 1 drop into the right eye at bedtime.    temazepam (RESTORIL) 15 MG capsule TAKE 1 CAPSULE(15 MG) BY MOUTH AT BEDTIME   pramipexole (MIRAPEX) 0.125 MG tablet Take 1 tablet (0.125 mg total) by mouth at bedtime. (  Patient not taking: Reported on 05/06/2022)   [DISCONTINUED] Loperamide HCl (IMODIUM PO) Take 1 capsule by mouth 2 (two) times daily as needed. (Patient not taking: Reported on 05/06/2022)   [DISCONTINUED] polyethylene glycol powder (GLYCOLAX/MIRALAX) 17 GM/SCOOP powder Take 17 g by mouth daily as needed for moderate constipation. Hold for loose stool   No facility-administered encounter medications on file as of 05/06/2022.    Allergies (verified) Azithromycin, Compazine [prochlorperazine edisylate], Demerol, Dolophine [methadone], Ebastine, Erythromycin, Ibuprofen, Meperidine hcl, Morphine sulfate, Statins, Tetanus toxoids, Tetracycline, and Tetracyclines & related   History: Past Medical History:  Diagnosis Date   Bladder prolapse, female, acquired    Cancer [place])    thyroid    Chest pain    no known ischemic heart disease; negative Myoview July 2013. EF 75% with no ischemia.    Chronic anticoagulation    Chronic atrial fibrillation (HCC)    managed with rate control and coumadin   Chronic diastolic heart failure (HCC)    GERD (gastroesophageal reflux disease)    Heart disease    History of congenital mitral regurgitation    mild   History of heart attack    Multiple    History of mitral valve prolapse 06/15/2008   a. echo 1/14: mild LVH, EF 60-65%, mild MR, mild to mod BAE, PASP 35   Hypercholesterolemia    Hypertension    Hypothyroidism    Past Surgical History:  Procedure Laterality Date   ABDOMINAL HYSTERECTOMY     CATARACT EXTRACTION     CHOLECYSTECTOMY     Thyroidectomy     TONSILLECTOMY     Family History  Problem Relation Age of Onset   Stroke Mother 61   Stroke Father 24   Arthritis Daughter    Gout Daughter    Immunocompromised Granddaughter    Heart attack Neg Hx    Heart disease Neg Hx    Social History   Socioeconomic History   Marital status: Widowed    Spouse name: Not on file   Number of children: 3   Years of education: BA   Highest education level: Not on file  Occupational History    Employer: RETIRED   Occupation: Retired [employer]  Tobacco Use   Smoking status: Never   Smokeless tobacco: Never  Vaping Use   Vaping Use: Never used  Substance and Sexual Activity   Alcohol use: No   Drug use: No   Sexual activity: Not Currently  Other Topics Concern   Not on file  Social History Narrative   Pt lives at home alone.   Caffeine Use: quit 21yr ago      Diet: No FMaceo Pro     Do you drink/ eat things with caffeineNo      Marital status:   Widowed                            What year were you married ? 1947      Do you live in a house, apartment,assistred living, condo, trailer, etc.)? [place]     Is it one or more stories? 1      How many persons live in your home ? Me      Do you have any pets  in your home ?(please list) No      Highest Level of education completed:  [place] [employer]     Current or past profession:  TPharmacist, hospital  Office, [employer]      Do you exercise?  No                            Type & how often       ADVANCED DIRECTIVES (Please bring copies)      Do you have a living will? Yes      Do you have a DNR form?   Yes                    If not, do you want to discuss one?       Do you have signed POA?HPOA forms?  Yes               If so, please bring to your appointment      FUNCTIONAL STATUS- To be completed by Spouse / child / Staff       Do you have difficulty bathing or dressing yourself ?  No      Do you have difficulty preparing food or eating ?  No      Do you have difficulty managing your mediation ?  No      Do you have difficulty managing your finances ?  No      Do you have difficulty affording your medication ?  No      Social Determinants of Health   Financial Resource Strain: Low Risk  (05/06/2022)   Overall Financial Resource Strain (CARDIA)    Difficulty of Paying Living Expenses: Not hard at all  Food Insecurity: No Food Insecurity (05/06/2022)   Hunger Vital Sign    Worried About Running Out of Food in the Last Year: Never true    Ran Out of Food in the Last Year: Never true  Transportation Needs: No Transportation Needs (05/06/2022)   PRAPARE - Hydrologist (Medical): No    Lack of Transportation (Non-Medical): No  Physical Activity: Insufficiently Active (05/06/2022)   Exercise Vital Sign    Days of Exercise per Week: 2 days    Minutes of Exercise per Session: 10 min  Stress: No Stress Concern Present (05/06/2022)   [place]    Feeling of Stress : Not at all  Social Connections: Moderately Integrated (05/06/2022)   Social Connection and Isolation Panel [NHANES]    Frequency of Communication with Friends and Family: Three times a  week    Frequency of Social Gatherings with Friends and Family: Three times a week    Attends Religious Services: 1 to 4 times per year    Active Member of Clubs or Organizations: Yes    Attends Archivist Meetings: More than 4 times per year    Marital Status: Widowed    Tobacco Counseling Counseling given: Not Answered   Clinical Intake:  Pre-visit preparation completed: Yes  Pain : No/denies pain     BMI - recorded: 24.44 Nutritional Status: BMI of 19-24  Normal Nutritional Risks: None Diabetes: No  How often do you need to have someone help you when you read instructions, pamphlets, or other written materials from your doctor or pharmacy?: 3 - Sometimes What is the last grade level you completed in school?: [employer] degree  Diabetic?No  Interpreter Needed?: No      Activities of Daily Living    05/06/2022    1:12 PM  In your present state  of health, do you have any difficulty performing the following activities:  Hearing? 0  Vision? 0  Difficulty concentrating or making decisions? 0  Walking or climbing stairs? 1  Dressing or bathing? 0  Doing errands, shopping? 1  Preparing Food and eating ? Y  Using the Toilet? N  In the past six months, have you accidently leaked urine? Y  Do you have problems with loss of bowel control? Y  Managing your Medications? N  Managing your Finances? N  Housekeeping or managing your Housekeeping? N    Patient Care Team: [name], NP as PCP - General (Adult Health Nurse Practitioner) O'Neal, Cassie Freer, MD as PCP - Cardiology (Cardiology) Rex Kras Claudette Stapler, RN as [place] Management Hum[place], Tillie Rung as McGovern, Cassie Freer, MD as Consulting Physician (Cardiology)  Indicate any recent [place] you may have received from other than [facility] providers in the past year (date may be approximate).     Assessment:   This is a routine wellness  examination for [place].  Hearing/Vision screen Hearing Screening - Comments:: No hearing issues Vision Screening - Comments:: Last eye exam less than 12 months ago with Dr. Lucianne Lei. Patient will have annual next Friday  Dietary issues and exercise activities discussed: Exercise limited by: cardiac condition(s)   Goals Addressed             This Visit's Progress    "I am concerned about my blood pressure"   On track    Care Coordination Interventions: Evaluation of current treatment plan related to hypertension self management and patient's adherence to plan as established by provider Review of patient status, including review of consultant's reports, relevant laboratory and other test results, and medications completed Reviewed medications with patient and discussed importance of compliance Advised patient, providing education and rationale, to monitor blood pressure daily and record, calling PCP for findings outside established parameters Encouraged patient to balance her activity with rest and to stay well hydrated Encouraged patient to continue to utilize the [facility] after hours nurse line as needed       Maintain Mobility and Function   On track    Evidence-based guidance:  Acknowledge and validate impact of pain, loss of strength and potential disfigurement (hand osteoarthritis) on mental health and daily life, such as social isolation, anxiety, depression, impaired sexual relationship and   injury from falls.  Anticipate referral to physical or occupational therapy for assessment, therapeutic exercise and recommendation for adaptive equipment or assistive devices; encourage participation.  Assess impact on ability to perform activities of daily living, as well as engage in sports and leisure events or requirements of work or school.  Provide anticipatory guidance and reassurance about the benefit of exercise to maintain function; acknowledge and normalize fear that exercise may worsen  symptoms.  Encourage regular exercise, at least 10 minutes at a time for 45 minutes per week; consider yoga, water exercise and proprioceptive exercises; encourage use of wearable activity tracker to increase motivation and adherence.  Encourage maintenance or resumption of daily activities, including employment, as pain allows and with minimal exposure to trauma.  Assist patient to advocate for adaptations to the work environment.  Consider level of pain and function, gender, age, lifestyle, patient preference, quality of life, readiness and ?ocapacity to benefit? when recommending patients for orthopaedic surgery consultation.  Explore strategies, such as changes to medication regimen or activity that enables patient to anticipate and manage flare-ups that increase deconditioning and  disability.  Explore patient preferences; encourage exposure to a broader range of activities that have been avoided for fear of experiencing pain.  Identify barriers to participation in therapy or exercise, such as pain with activity, anticipated or imagined pain.  Monitor postoperative joint replacement or any preexisting joint replacement for ongoing pain and loss of function; provide social support and encouragement throughout recovery.   Notes:        Depression Screen    05/06/2022    1:10 PM 05/06/2022   12:35 PM 04/19/2021    1:58 PM 04/19/2021    1:44 PM 01/06/2020    2:11 PM 10/01/2018   10:02 AM 04/02/2018    2:13 PM  PHQ 2/9 Scores  PHQ - 2 Score 0 0 0 0 0 0 0    Fall Risk    05/06/2022    1:12 PM 05/06/2022   12:35 PM 04/11/2022    9:29 AM 02/21/2022    1:52 PM 02/20/2022    1:30 PM  Fall Risk   Falls in the past year? 1 1 0 0 1  Comment  opened dish washer door and it caused a fall     Number falls in past yr: 0 0 0 0 0  Injury with Fall? 0 0 0 0 0  Risk for fall due to : History of fall(s);Impaired balance/gait No Fall Risks No Fall Risks History of fall(s) History of fall(s)  Follow up Falls  evaluation completed;Education provided Falls evaluation completed  Falls evaluation completed Falls evaluation completed    FALL RISK PREVENTION PERTAINING TO THE HOME:  Any stairs in or around the home? No  If so, are there any without handrails? No  Home free of loose throw rugs in walkways, pet beds, electrical cords, etc? Yes  Adequate lighting in your home to reduce risk of falls? Yes   ASSISTIVE DEVICES UTILIZED TO PREVENT FALLS:  Life alert? Yes  Use of a cane, walker or w/c? Yes  Grab bars in the bathroom? Yes  Shower chair or bench in shower? No  Elevated toilet seat or a handicapped toilet? Yes   TIMED UP AND GO:  Was the test performed? No .  Length of time to ambulate 10 feet: N/A sec.   Gait slow and steady with assistive device  Cognitive Function:    11/23/2020    1:34 PM  MMSE - Mini Mental State Exam  Orientation to time 4  Orientation to Place 5  Registration 3  Attention/ Calculation 5  Recall 3  Language- name 2 objects 2  Language- repeat 1  Language- follow 3 step command 3  Language- read & follow direction 1  Write a sentence 1  Copy design 1  Total score 29        05/06/2022   12:37 PM 04/19/2021    1:45 PM  6CIT Screen  What Year? 0 points 0 points  What month? 0 points 0 points  What time? 0 points 0 points  Count back from 20 0 points 0 points  Months in reverse 0 points 0 points  Repeat phrase 0 points 2 points  Total Score 0 points 2 points    Immunizations Immunization History  Administered Date(s) Administered   Influenza Split 04/28/2009, 06/07/2010, 04/30/2012, 06/08/2013   Influenza, High Dose Seasonal PF 04/21/2014, 04/17/2016, 06/18/2017, 06/19/2018, 04/16/2019, 07/03/2020, 06/11/2021   Influenza,inj,Quad PF,6+ Mos 05/20/2011   Influenza,inj,quad, With Preservative 04/13/2015   Moderna SARS-COV2 Booster Vaccination 01/18/2021   Moderna Sars-Covid-2 Vaccination  10/01/2019, 10/29/2019, 06/29/2020   Pfizer Covid-19  Vaccine Bivalent Booster 19yr & up 06/11/2021   Pneumococcal Conjugate-13 10/01/2013   Pneumococcal Polysaccharide-23 04/12/2010, 06/18/2017   Tdap 05/20/2011   Zoster, Live 06/04/2006    TDAP status: Due, Education has been provided regarding the importance of this vaccine. Advised may receive this vaccine at local pharmacy or Health Dept. Aware to provide a copy of the vaccination record if obtained from local pharmacy or Health Dept. Verbalized acceptance and understanding.  Flu Vaccine status: Due, Education has been provided regarding the importance of this vaccine. Advised may receive this vaccine at local pharmacy or Health Dept. Aware to provide a copy of the vaccination record if obtained from local pharmacy or Health Dept. Verbalized acceptance and understanding.  Pneumococcal vaccine status: Up to date  Covid-19 vaccine status: Completed vaccines  Qualifies for Shingles Vaccine? Yes   Zostavax completed Yes   Shingrix Completed?: No.    Education has been provided regarding the importance of this vaccine. Patient has been advised to call insurance company to determine out of pocket expense if they have not yet received this vaccine. Advised may also receive vaccine at local pharmacy or Health Dept. Verbalized acceptance and understanding.  Screening Tests Health Maintenance  Topic Date Due   COVID-19 Vaccine (5 - Moderna risk series) 08/06/2021   INFLUENZA VACCINE  03/19/2022   Pneumonia Vaccine 88 Years old  Completed   HPV VACCINES  Aged Out   DEXA SCAN  Discontinued   TETANUS/TDAP  Discontinued   Zoster Vaccines- Shingrix  Discontinued    Health Maintenance  Health Maintenance Due  Topic Date Due   COVID-19 Vaccine (5 - Moderna risk series) 08/06/2021   INFLUENZA VACCINE  03/19/2022    Colorectal cancer screening: No longer required.   Mammogram status: No longer required due to advanced age.  Bone Density status: Completed 11/15/1989. Results reflect: Bone  density results: NORMAL. Repeat every unknown years.  Lung Cancer Screening: (Low Dose CT Chest recommended if Age 86-80years, 30 pack-year currently smoking OR have quit w/in 15years.) does not qualify.   Lung Cancer Screening Referral: No  Additional Screening:  Hepatitis C Screening: does not qualify; Completed   Vision Screening: Recommended annual ophthalmology exams for early detection of glaucoma and other disorders of the eye. Is the patient up to date with their annual eye exam?  Yes  Who is the provider or what is the name of the office in which the patient attends annual eye exams? Dr. PPosey Pronto If pt is not established with a provider, would they like to be referred to a provider to establish care? No .   Dental Screening: Recommended annual dental exams for proper oral hygiene  Community Resource Referral / Chronic Care Management: CRR required this visit?  No   CCM required this visit?  No      Plan:     I have personally reviewed and noted the following in the patient's chart:   Medical and social history Use of alcohol, tobacco or illicit drugs  Current medications and supplements including opioid prescriptions. Patient is not currently taking opioid prescriptions. Functional ability and status Nutritional status Physical activity Advanced directives List of other physicians Hospitalizations, surgeries, and ER visits in previous 12 months Vitals Screenings to include cognitive, depression, and falls Referrals and appointments  In addition, I have reviewed and discussed with patient certain preventive protocols, quality metrics, and best practice recommendations. A written personalized care plan for preventive services  as well as general preventive health recommendations were provided to patient.   Telephone Note   I connected with Clydell Hakim by telephone and verified that I am speaking with the correct person using two identifiers.   Location:  home Patient: Mykael Trott Provider: [name], AGNP-C    I discussed the limitations, risks, security and privacy concerns of performing an evaluation and management service by telephone and the availability of in person appointments. I also discussed with the patient that there may be a patient responsible charge related to this service. The patient expressed understanding and agreed to proceed.    I discussed the assessment and treatment plan with the patient. The patient was provided an opportunity to ask questions and all were answered. The patient agreed with the plan and demonstrated an understanding of the instructions.     The patient was advised to call back or seek an in-person evaluation if the symptoms worsen or if the condition fails to improve as anticipated.   I provided 12 minutes of non-face-to-face time during this encounter.   [name], NP  Avs printed and mailed      [name], NP   05/06/2022   Nurse Notes: Recommend getting annual flu, tetanus and Shingrix vaccine

## 2022-05-08 ENCOUNTER — Other Ambulatory Visit: Payer: Self-pay | Admitting: Cardiovascular Disease

## 2022-05-08 MED ORDER — POTASSIUM CHLORIDE CRYS ER 10 MEQ PO TBCR: 10.0000 meq | EXTENDED_RELEASE_TABLET | Freq: Every day | ORAL | 2 refills | Status: DC

## 2022-05-08 NOTE — Telephone Encounter (Signed)
Rx(s) sent to pharmacy electronically.  

## 2022-05-12 ENCOUNTER — Other Ambulatory Visit: Payer: Self-pay | Admitting: Orthopedic Surgery

## 2022-05-12 DIAGNOSIS — G2581 Restless legs syndrome: Secondary | ICD-10-CM

## 2022-05-13 ENCOUNTER — Telehealth: Payer: Self-pay | Admitting: Cardiovascular Disease

## 2022-05-13 NOTE — Telephone Encounter (Signed)
Attempted to contact patient to discuss further.  Unable to leave message.

## 2022-05-13 NOTE — Telephone Encounter (Signed)
Pt c/o medication issue:  1. Name of Medication: ELIQUIS 2.5 MG TABS tablet  2. How are you currently taking this medication (dosage and times per day)? TAKE 1 TABLET BY MOUTH TWICE DAILY  3. Are you having a reaction (difficulty breathing--STAT)? No   4. What is your medication issue? Patient is calling to see if there a generic brand for the patient using ELIQUIS 2.5 MG TABS tablet

## 2022-05-14 DIAGNOSIS — H04123 Dry eye syndrome of bilateral lacrimal glands: Secondary | ICD-10-CM | POA: Diagnosis not present

## 2022-05-14 DIAGNOSIS — H18423 Band keratopathy, bilateral: Secondary | ICD-10-CM | POA: Diagnosis not present

## 2022-05-14 DIAGNOSIS — H401131 Primary open-angle glaucoma, bilateral, mild stage: Secondary | ICD-10-CM | POA: Diagnosis not present

## 2022-05-14 DIAGNOSIS — H35033 Hypertensive retinopathy, bilateral: Secondary | ICD-10-CM | POA: Diagnosis not present

## 2022-05-14 DIAGNOSIS — H524 Presbyopia: Secondary | ICD-10-CM | POA: Diagnosis not present

## 2022-05-14 NOTE — Telephone Encounter (Signed)
Spoke to patient she stated Eliquis is too expensive.Patient assistance application mailed to patient.She will bring back to office along with proof of income.I will send message to Dr.O'Neal's nurse.

## 2022-05-14 NOTE — Telephone Encounter (Signed)
Left message for pt to call.

## 2022-05-14 NOTE — Telephone Encounter (Signed)
Patient is returning call.  °

## 2022-05-20 ENCOUNTER — Other Ambulatory Visit: Payer: Medicare Other

## 2022-05-20 DIAGNOSIS — E890 Postprocedural hypothyroidism: Secondary | ICD-10-CM | POA: Diagnosis not present

## 2022-05-21 LAB — TSH: TSH: 17.58 mIU/L — ABNORMAL HIGH (ref 0.40–4.50)

## 2022-05-22 ENCOUNTER — Other Ambulatory Visit: Payer: Self-pay

## 2022-05-22 DIAGNOSIS — E890 Postprocedural hypothyroidism: Secondary | ICD-10-CM

## 2022-05-22 MED ORDER — LEVOTHYROXINE SODIUM 75 MCG PO TABS: 75.0000 ug | ORAL_TABLET | Freq: Every day | ORAL | 3 refills | Status: DC

## 2022-05-22 NOTE — Progress Notes (Signed)
Thank you  for update

## 2022-05-24 NOTE — Telephone Encounter (Signed)
Received information- will have MD sign when back in office.

## 2022-05-29 ENCOUNTER — Ambulatory Visit: Payer: Self-pay

## 2022-05-29 NOTE — Patient Outreach (Signed)
  Care Coordination   05/29/2022 Name: Kristen Whitehead MRN: [id number] DOB: Jan 08, 1923   Care Coordination Outreach Attempts:  An unsuccessful telephone outreach was attempted for a scheduled appointment today.  Follow Up Plan:  Additional outreach attempts will be made to offer the patient care coordination information and services.   Encounter Outcome:  Pt. Request to Call Back  Care Coordination Interventions Activated:  No   Care Coordination Interventions:  No, not indicated    Barb Merino, RN, BSN, CCM Care Management Coordinator [place] Management Direct Phone: [phone]

## 2022-06-03 ENCOUNTER — Ambulatory Visit: Payer: Self-pay

## 2022-06-03 NOTE — Patient Outreach (Signed)
  Care Coordination   06/03/2022 Name: Kristen Whitehead MRN: [id number] DOB: June 04, 1923   Care Coordination Outreach Attempts:  An unsuccessful telephone outreach was attempted for a scheduled appointment today.  Follow Up Plan:  Additional outreach attempts will be made to offer the patient care coordination information and services.   Encounter Outcome:  No Answer  Care Coordination Interventions Activated:  No   Care Coordination Interventions:  No, not indicated    Barb Merino, RN, BSN, CCM Care Management Coordinator [place] Management Direct Phone: [phone]

## 2022-06-07 NOTE — Telephone Encounter (Signed)
Called patient, advised I just need information from her, patient will get this to me and we can fax it off.

## 2022-06-07 NOTE — Telephone Encounter (Signed)
Received provider portion of the application- will notify patient to bring her the rest and we can fax it off for her.   Thank you!

## 2022-06-10 DIAGNOSIS — H353211 Exudative age-related macular degeneration, right eye, with active choroidal neovascularization: Secondary | ICD-10-CM | POA: Diagnosis not present

## 2022-06-24 ENCOUNTER — Ambulatory Visit: Payer: Medicare Other | Admitting: Adult Health

## 2022-06-24 ENCOUNTER — Other Ambulatory Visit: Payer: Self-pay

## 2022-06-24 ENCOUNTER — Encounter: Payer: Self-pay | Admitting: Adult Health

## 2022-06-24 ENCOUNTER — Other Ambulatory Visit: Payer: Medicare Other

## 2022-06-24 ENCOUNTER — Telehealth: Payer: Self-pay | Admitting: *Deleted

## 2022-06-24 VITALS — BP 120/81 | HR 74 | Temp 97.6°F | Ht 59.0 in | Wt 118.0 lb

## 2022-06-24 DIAGNOSIS — I10 Essential (primary) hypertension: Secondary | ICD-10-CM

## 2022-06-24 DIAGNOSIS — G2581 Restless legs syndrome: Secondary | ICD-10-CM

## 2022-06-24 DIAGNOSIS — E890 Postprocedural hypothyroidism: Secondary | ICD-10-CM

## 2022-06-24 DIAGNOSIS — I5032 Chronic diastolic (congestive) heart failure: Secondary | ICD-10-CM

## 2022-06-24 DIAGNOSIS — I4821 Permanent atrial fibrillation: Secondary | ICD-10-CM | POA: Diagnosis not present

## 2022-06-24 DIAGNOSIS — R531 Weakness: Secondary | ICD-10-CM

## 2022-06-24 LAB — CBC WITH DIFFERENTIAL/PLATELET
Absolute Monocytes: 449 cells/uL (ref 200–950)
Basophils Absolute: 20 cells/uL (ref 0–200)
Basophils Relative: 0.3 %
Eosinophils Absolute: 111 cells/uL (ref 15–500)
Eosinophils Relative: 1.7 %
HCT: 44.7 % (ref 35.0–45.0)
Hemoglobin: 15.3 g/dL (ref 11.7–15.5)
Lymphs Abs: 3250 cells/uL (ref 850–3900)
MCH: 32.2 pg (ref 27.0–33.0)
MCHC: 34.2 g/dL (ref 32.0–36.0)
MCV: 94.1 fL (ref 80.0–100.0)
MPV: 9.9 fL (ref 7.5–12.5)
Monocytes Relative: 6.9 %
Neutro Abs: 2672 cells/uL (ref 1500–7800)
Neutrophils Relative %: 41.1 %
Platelets: 238 10*3/uL (ref 140–400)
RBC: 4.75 10*6/uL (ref 3.80–5.10)
RDW: 14.3 % (ref 11.0–15.0)
Total Lymphocyte: 50 %
WBC: 6.5 10*3/uL (ref 3.8–10.8)

## 2022-06-24 LAB — BASIC METABOLIC PANEL WITH GFR
BUN: 9 mg/dL (ref 7–25)
CO2: 35 mmol/L — ABNORMAL HIGH (ref 20–32)
Calcium: 9.7 mg/dL (ref 8.6–10.4)
Chloride: 96 mmol/L — ABNORMAL LOW (ref 98–110)
Creat: 0.79 mg/dL (ref 0.60–0.95)
Glucose, Bld: 91 mg/dL (ref 65–99)
Potassium: 4.2 mmol/L (ref 3.5–5.3)
Sodium: 137 mmol/L (ref 135–146)
eGFR: 68 mL/min/{1.73_m2} (ref >=60)

## 2022-06-24 NOTE — Progress Notes (Signed)
[facility] clinic  Provider:  Durenda Age DNP  Code Status:   Full Code  Goals of Care:     06/24/2022    2:58 PM  Advanced Directives  Does Patient Have a Medical Advance Directive? Yes  Type of Paramedic of [place];Living will;Out of facility DNR (pink MOST or yellow form)  Copy of [place] in Chart? No - copy requested     Chief Complaint  Patient presents with   Acute Visit    Dizziness/ weakness    HPI: Patient is a 88 y.o. female seen today for an acute visit for generalized weakness. She was accompanied today by her son. She reported to have fallen 2 days ago while going to the bathroom during the night and going back to bed. The next day, she almost fell down but was caught by her neighbor. Son reported that 2 weeks ago, she has bruising on her forehead due to a fall.Son reported that she has been falling for a year. Daughter gave her a walker to  help her ambulate. She had home health PT at home 3 years ago due to generalized weakness. She lives in a townhouse by herself and son visits at least once a week. Son stated that they have tried caregivers before but they don't stay. She is currently taking Eliquis 2.5 mg BID for atrial fibrillation. Son and patient aware of risks and will discuss with cardiology on 07/01/22 during her follow up.  She takes Temazepam 15 mg at bedtime. She does not want decreasing dosage of Temazepam.  She takes Levothyroxine for hypothyroidism. Last tsh 17.58, elevated.  Past Medical History:  Diagnosis Date   Bladder prolapse, female, acquired    Cancer [facility])    thyroid   Chest pain    no known ischemic heart disease; negative Myoview July 2013. EF 75% with no ischemia.    Chronic anticoagulation    Chronic atrial fibrillation (HCC)    managed with rate control and coumadin   Chronic diastolic heart failure (HCC)    GERD (gastroesophageal reflux disease)    Heart disease    History of  congenital mitral regurgitation    mild   History of heart attack    Multiple    History of mitral valve prolapse 06/15/2008   a. echo 1/14: mild LVH, EF 60-65%, mild MR, mild to mod BAE, PASP 35   Hypercholesterolemia    Hypertension    Hypothyroidism     Past Surgical History:  Procedure Laterality Date   ABDOMINAL HYSTERECTOMY     CATARACT EXTRACTION     CHOLECYSTECTOMY     Thyroidectomy     TONSILLECTOMY      Allergies  Allergen Reactions   Azithromycin Nausea Only and Other (See Comments)   Compazine [Prochlorperazine Edisylate] Nausea Only   Demerol Nausea Only   Dolophine [Methadone] Nausea Only   Ebastine Nausea Only    EBS   Erythromycin Nausea Only and Other (See Comments)   Ibuprofen Nausea Only and Other (See Comments)   Meperidine Hcl Other (See Comments)   Morphine Sulfate Other (See Comments)   Statins Other (See Comments)    myalgias   Tetanus Toxoids Nausea Only   Tetracycline Other (See Comments)   Tetracyclines & Related Nausea Only    Outpatient Encounter Medications as of 06/24/2022  Medication Sig   amLODipine (NORVASC) 2.5 MG tablet TAKE 2 TABLETS BY MOUTH  DAILY   atenolol (TENORMIN) 25 MG tablet  Take 1 tablet (25 mg total) by mouth 2 (two) times daily.   Calcium Carbonate-Vitamin D (CALCIUM 600+D PO) Take 1 tablet by mouth in the morning and at bedtime.   Cholecalciferol (VITAMIN D) 2000 UNITS tablet Take 2,000 Units by mouth daily.   dorzolamide (TRUSOPT) 2 % ophthalmic solution Place 1 drop into the left eye 2 (two) times daily.   ELIQUIS 2.5 MG TABS tablet TAKE 1 TABLET BY MOUTH  TWICE DAILY   furosemide (LASIX) 40 MG tablet Take 1 tablet (40 mg total) by mouth every morning AND 0.5 tablets (20 mg total) every evening.   latanoprost (XALATAN) 0.005 % ophthalmic solution Place 1 drop into both eyes at bedtime.    levothyroxine (SYNTHROID) 75 MCG tablet Take 1 tablet (75 mcg total) by mouth daily.   MAGNESIUM OXIDE 400 PO Take 800 mg by mouth  daily.   Multiple Vitamins-Minerals (CENTRUM SILVER PO) Take 1 tablet by mouth daily.   Multiple Vitamins-Minerals (PRESERVISION AREDS 2 PO) Take 1 capsule by mouth 2 (two) times daily.    nitroGLYCERIN (NITROSTAT) 0.4 MG SL tablet DISSOLVE 1 TABLET UNDER THE TONGUE EVERY 5 MINUTES FOR 3 DOSES AS NEEDED  FOR  CHEST  PAIN   omeprazole (PRILOSEC) 20 MG capsule Take 20 mg by mouth daily as needed.   potassium chloride (KLOR-CON M) 10 MEQ tablet Take 1 tablet (10 mEq total) by mouth daily.   pramipexole (MIRAPEX) 0.125 MG tablet TAKE 1 TABLET BY MOUTH AT  BEDTIME   PREMARIN vaginal cream Place 2 g vaginally 2 (two) times a week. Uses on Sunday and wednesday   Probiotic Product (PROBIOTIC DAILY PO) Take 1 tablet by mouth daily.   sodium chloride (MURO 128) 5 % ophthalmic solution Place 1 drop into the right eye at bedtime.    temazepam (RESTORIL) 15 MG capsule TAKE 1 CAPSULE(15 MG) BY MOUTH AT BEDTIME   No facility-administered encounter medications on file as of 06/24/2022.    Review of Systems:  Review of Systems  Constitutional:  Negative for appetite change, chills, fatigue and fever.  HENT:  Negative for congestion, hearing loss, rhinorrhea and sore throat.   Eyes: Negative.   Respiratory:  Negative for cough, shortness of breath and wheezing.   Cardiovascular:  Negative for chest pain, palpitations and leg swelling.  Gastrointestinal:  Negative for abdominal pain, constipation, diarrhea, nausea and vomiting.  Genitourinary:  Negative for dysuria.  Musculoskeletal:  Positive for gait problem. Negative for arthralgias, back pain and myalgias.  Skin:  Negative for color change, rash and wound.  Neurological:  Positive for weakness. Negative for dizziness and headaches.  Psychiatric/Behavioral:  Negative for behavioral problems. The patient is not nervous/anxious.     Health Maintenance  Topic Date Due   COVID-19 Vaccine (5 - Moderna risk series) 08/12/2022   Medicare Annual Wellness  (AWV)  05/07/2023   Pneumonia Vaccine 50+ Years old  Completed   INFLUENZA VACCINE  Completed   HPV VACCINES  Aged Out   DEXA SCAN  Discontinued   TETANUS/TDAP  Discontinued   Zoster Vaccines- Shingrix  Discontinued    Physical Exam: Vitals:   06/24/22 1058  BP: 120/81  Pulse: 74  Temp: 97.6 F (36.4 C)  SpO2: 96%  Weight: 118 lb (53.5 kg)  Height: _0  (1.499 m)   Body mass index is 23.83 kg/m. Physical Exam Constitutional:      General: She is not in acute distress.    Appearance: Normal appearance.  HENT:  Head: Normocephalic and atraumatic.     Nose: Nose normal.     Mouth/Throat:     Mouth: Mucous membranes are moist.  Eyes:     Conjunctiva/sclera: Conjunctivae normal.  Cardiovascular:     Rate and Rhythm: Normal rate and regular rhythm.  Pulmonary:     Effort: Pulmonary effort is normal.     Breath sounds: Normal breath sounds.  Abdominal:     General: Bowel sounds are normal.     Palpations: Abdomen is soft.  Musculoskeletal:        General: Normal range of motion.     Cervical back: Normal range of motion.  Skin:    General: Skin is warm and dry.  Neurological:     General: No focal deficit present.     Mental Status: She is oriented to person, place, and time.  Psychiatric:        Mood and Affect: Mood normal.        Behavior: Behavior normal.        Thought Content: Thought content normal.        Judgment: Judgment normal.     Labs reviewed: Basic Metabolic Panel: Recent Labs    08/23/21 1459 01/31/22 1051 02/21/22 1418 04/03/22 1114 05/20/22 1120  NA 139 139  --   --   --   K 4.1 4.0  --   --   --   CL 100 100  --   --   --   CO2 31 30  --   --   --   GLUCOSE 85 94  --   --   --   BUN 12 11  --   --   --   CREATININE 0.78 0.70  --   --   --   CALCIUM 9.0 8.1*  --   --   --   TSH  --   --  0.15* 0.11* 17.58*   Liver Function Tests: Recent Labs    08/23/21 1459 01/31/22 1051  AST 22 23  ALT 14 15  BILITOT 0.6 0.5  PROT  6.5 6.3   No results for input(s): "LIPASE", "AMYLASE" in the last 8760 hours. No results for input(s): "AMMONIA" in the last 8760 hours. CBC: Recent Labs    08/23/21 1459 01/31/22 1051  WBC 5.4 5.4  NEUTROABS 2,635 2,662  HGB 14.1 13.9  HCT 43.3 42.8  MCV 95.4 93.9  PLT 253 217   Lipid Panel: No results for input(s): "CHOL", "HDL", "LDLCALC", "TRIG", "CHOLHDL", "LDLDIRECT" in the last 8760 hours. No results found for: "HGBA1C"  Procedures since last visit: No results found.  Assessment/Plan  1. Generalized weakness -  continue use of walker -  keep house free of clutters - Ambulatory referral to [facility] for evaluation and treatment - CBC With Differential/Platelet - BMP with eGFR([facility])  2. Postoperative hypothyroidism Lab Results  Component Value Date   TSH 17.58 (H) 05/20/2022   -  continue Levothyroxine  3. Essential hypertension -  BP 120/81 -  continue Amlodipine and Atenolol  4. Restless legs -  continue Mirapex, does not want dosage reduction  5. Permanent atrial fibrillation (HCC) -  rate-controlled, continue Eliquis for anticoagulation  6. Chronic diastolic heart failure -  no SOB -  continue Lasix    Labs/tests ordered:   BMP, CBC and tsh  Next appt:  Visit date not found

## 2022-06-24 NOTE — Progress Notes (Signed)
  Care Coordination Note  06/24/2022 Name: MAICEE ULLMAN MRN: [id number] DOB: Sep 29, 1922  Kristen Whitehead is a 88 y.o. year old female who is a primary care patient of Cleophas Dunker, [place], NP and is actively engaged with the care management team. I reached out to [name] by phone today to assist with re-scheduling a follow up visit with the RN Case Manager  Follow up plan: Unsuccessful telephone outreach attempt made.    [place]  Direct Dial: [phone]

## 2022-06-24 NOTE — Patient Instructions (Signed)
Weakness Weakness is a lack of strength. You may feel weak all over your body (generalized), or you may feel weak in one part of your body (focal). Common causes of weakness include: Infection and disorders of the body's defense system (immune system). Physical exhaustion. Internal bleeding or other blood loss that results in a lack of red blood cells (anemia). Dehydration. An imbalance in mineral (electrolyte) levels, such as potassium. Chronic kidney or liver disease. Cancer. Other causes include: Some medicines or cancer treatment. Stress, anxiety, or depression. Heart disease, circulation problems, or stroke. Nervous system disorders. Thyroid disorders. Loss of muscle strength because of age or inactivity. Poor sleep quality or sleep disorders. The cause of your weakness may not be known. Some causes of weakness can be serious, so it is important to see your health care provider. Follow these instructions at home: Activity Rest as needed. Try to get enough sleep. Most adults need 7-8 hours of quality sleep each night. Talk to your health care provider about how much sleep you need. Do exercises, such as arm curls and leg raises, for 30 minutes at least 2 days a week or as told by your health care provider. This helps build muscle strength. Consider working with a physical therapist or trainer who can develop an exercise plan to help you gain muscle strength. General instructions  Take over-the-counter and prescription medicines only as told by your health care provider. Eat a healthy, well-balanced diet. This includes: Proteins to build muscles, such as lean meats and fish. Fresh fruits and vegetables. Carbohydrates to boost energy, such as whole grains. Drink enough fluid to keep your urine pale yellow. Keep all follow-up visits. This is important. Contact a health care provider if: Your weakness does not improve or gets worse. Your weakness affects your ability to think  clearly. Your weakness affects your ability to do your normal daily activities. Get help right away if: You develop sudden weakness, especially on one side of your face or body. You have chest pain. You have trouble breathing or shortness of breath. You have problems with your vision. You have trouble talking or swallowing. You have trouble standing or walking. You are light-headed or lose consciousness. These symptoms may be an emergency. Get help right away. Call 911. Do not wait to see if the symptoms will go away. Do not drive yourself to the hospital. Summary Weakness is a lack of strength. You may feel weak all over your body or just in one specific part of your body. Weakness can be caused by a variety of things. In some cases, the cause may be unknown. Rest as needed, and try to get enough sleep. Most adults need 7-8 hours of quality sleep each night. Eat a healthy, well-balanced diet. This information is not intended to replace advice given to you by your health care provider. Make sure you discuss any questions you have with your health care provider. Document Revised: 07/08/2021 Document Reviewed: 07/08/2021 Elsevier Patient Education  2023 [employer]  

## 2022-06-26 DIAGNOSIS — H35342 Macular cyst, hole, or pseudohole, left eye: Secondary | ICD-10-CM | POA: Diagnosis not present

## 2022-06-26 DIAGNOSIS — H18421 Band keratopathy, right eye: Secondary | ICD-10-CM | POA: Diagnosis not present

## 2022-06-26 DIAGNOSIS — H35373 Puckering of macula, bilateral: Secondary | ICD-10-CM | POA: Diagnosis not present

## 2022-06-26 DIAGNOSIS — Z961 Presence of intraocular lens: Secondary | ICD-10-CM | POA: Diagnosis not present

## 2022-06-26 DIAGNOSIS — H353211 Exudative age-related macular degeneration, right eye, with active choroidal neovascularization: Secondary | ICD-10-CM | POA: Diagnosis not present

## 2022-06-26 DIAGNOSIS — H35363 Drusen (degenerative) of macula, bilateral: Secondary | ICD-10-CM | POA: Diagnosis not present

## 2022-07-01 ENCOUNTER — Ambulatory Visit: Payer: Medicare Other | Admitting: Cardiovascular Disease

## 2022-07-01 ENCOUNTER — Other Ambulatory Visit: Payer: Medicare Other

## 2022-07-02 ENCOUNTER — Telehealth: Payer: Self-pay | Admitting: *Deleted

## 2022-07-02 DIAGNOSIS — I110 Hypertensive heart disease with heart failure: Secondary | ICD-10-CM | POA: Diagnosis not present

## 2022-07-02 DIAGNOSIS — I4821 Permanent atrial fibrillation: Secondary | ICD-10-CM | POA: Diagnosis not present

## 2022-07-02 DIAGNOSIS — G2581 Restless legs syndrome: Secondary | ICD-10-CM | POA: Diagnosis not present

## 2022-07-02 DIAGNOSIS — E890 Postprocedural hypothyroidism: Secondary | ICD-10-CM | POA: Diagnosis not present

## 2022-07-02 DIAGNOSIS — R531 Weakness: Secondary | ICD-10-CM | POA: Diagnosis not present

## 2022-07-02 DIAGNOSIS — I5032 Chronic diastolic (congestive) heart failure: Secondary | ICD-10-CM | POA: Diagnosis not present

## 2022-07-02 NOTE — Telephone Encounter (Signed)
Kristen Whitehead with [facility] called requesting verbal orders for PT 1X2weeks, 2X2weeks, 1X3weeks and Social Worker eval and Tx.   Verbal orders given.

## 2022-07-02 NOTE — Progress Notes (Unsigned)
Cardiology Clinic Note   Patient Name: Kristen Whitehead Date of Encounter: 07/03/2022  Primary Care Provider:  [name], NP Primary Cardiologist:  Evalina Field, MD  Patient Profile    Kristen Whitehead 88 year old female presents the clinic today for evaluation of her labile blood pressure.  Past Medical History    Past Medical History:  Diagnosis Date   Bladder prolapse, female, acquired    Cancer [facility])    thyroid   Chest pain    no known ischemic heart disease; negative Myoview July 2013. EF 75% with no ischemia.    Chronic anticoagulation    Chronic atrial fibrillation (HCC)    managed with rate control and coumadin   Chronic diastolic heart failure (HCC)    GERD (gastroesophageal reflux disease)    Heart disease    History of congenital mitral regurgitation    mild   History of heart attack    Multiple    History of mitral valve prolapse 06/15/2008   a. echo 1/14: mild LVH, EF 60-65%, mild MR, mild to mod BAE, PASP 35   Hypercholesterolemia    Hypertension    Hypothyroidism    Past Surgical History:  Procedure Laterality Date   ABDOMINAL HYSTERECTOMY     CATARACT EXTRACTION     CHOLECYSTECTOMY     Thyroidectomy     TONSILLECTOMY      Allergies  Allergies  Allergen Reactions   Azithromycin Nausea Only and Other (See Comments)   Compazine [Prochlorperazine Edisylate] Nausea Only   Demerol Nausea Only   Dolophine [Methadone] Nausea Only   Ebastine Nausea Only    EBS   Erythromycin Nausea Only and Other (See Comments)   Ibuprofen Nausea Only and Other (See Comments)   Meperidine Hcl Other (See Comments)   Morphine Sulfate Other (See Comments)   Statins Other (See Comments)    myalgias   Tetanus Toxoids Nausea Only   Tetracycline Other (See Comments)   Tetracyclines & Related Nausea Only    History of Present Illness    Kristen Whitehead has a PMH of atrial fibrillation, HTN, HLD, mild MR and AR, and chronic diastolic CHF.  She was  seen 1/17 in follow-up after frequent episodes of chest pain.  She was referred for stress testing but declined.  She indicated that she had not tolerated Lexiscan well in the past.  Her troponins were checked and were negative.  It was noted that this is been a chronic problem so further evaluation was deferred.  Her stress test in 2013 was negative for ischemia.  She indicated that she had " heart attacks" when she would overexert herself.  With these episodes she described pain across her chest which was associated with shortness of breath.  She denied associated lower extremity edema, orthopnea, or PND.  She indicated that when she has the pain she prays forgot to take it away and eventually the pain subsides.  Her Imdur was increased to 90 mg daily on 3/17.  She had an episode of heart failure 6/17 that responded to increased furosemide.  She is noted to have chronic external dyspnea.   She was seen by Jory Sims, DNP on 12/11/2016.  At that time her Lasix was increased to 60 mg in the morning milligrams in the afternoon.  There was no change noted in her symptoms.  She was seen by Jory Sims, DNP on 6/19 and her energy was worse but her blood pressure had improved.  She  felt better with physical activity.  It was noted that her blood pressure was low in the morning so her Imdur was decreased to 60 mg.   She was last seen by Dr. Oval Linsey on 04/04/2020.  During that time she complained of increased edema and shortness of breath.  At that time her Lasix was increased to 40 mg twice daily.  She reported that she generally felt well.  Her weight was stable.  She followed a low-sodium diet, her blood pressure was well controlled at home.  She denied symptoms of lightheadedness or dizziness.  She continued to have shortness of breath with increased physical activity.  She denied exertional chest pain orthopnea and PND.   She contacted nurse triage line on 06/22/2020 and indicated that she had an  episode of chest discomfort while she was laying in bed 06/21/2020.  She did not try any interventions to make her chest discomfort better.  She also complained of increased lower extremity edema.  She was instructed to double her furosemide for the next 3 days.   She presented to the clinic 06/22/2020 for further evaluation and stated that on 06/20/2020 she experienced chest discomfort while at rest.  It appeared she was laying down during the event.  She stated that she did not feel like getting up and went to sleep.  She stated that in the past when this would happen she would sit up in her recliner, pray, and the pain would go away.  She had not had any further episodes of chest discomfort.  She continued to be physically active walking and doing other types of light physical activity.  She denied exertional chest pain.  She stated that she did not know if her Prilosec was working well.  She state that she  had a chronic cough as well.  I stopped her Prilosec and start her on Protonix 20 mg daily.  We gave her the GERD diet information.  I have asked her to increase her physical activity as tolerated and follow-up in 3 months.  She was seen by Dr. Oval Linsey on 12/19/2020.  Her irbesartan has been previously stopped due to low blood pressure.  She had been referred to [facility] for memory loss.  During her visit she reported feeling fine.  She had gained 2.5 pounds overnight and was noted to have lower extremity swelling in her feet.  She reported intermittent lower chest pain but had not occurred for some time.  She reported that she was not able to climb steps or curbs without her cane.  She reported that she had fallen at home down her porch stairs backwards.  She denied chest pain palpitations lightheadedness or dizziness.  She denied injury from her falls.  Her blood pressure was 110/62 and stable since her previous visit.  She was noted to have a cough and noted that her symptoms did not occur when  she would take omeprazole regularly.  She denies chest pain shortness of breath.  She was seen in follow-up by Dr. Audie Box on 03/29/2022.  During that time she reported feeling like she had low energy.  She denied increased lower extremity swelling.  She indicated that her blood pressure has been low.  Her heart rate was in the 40s however this resolved.  She felt well.  Her heart rate was back in the 70s.  Her blood pressure was 108/64.  She remained on Imdur and amlodipine.  Stopping the medication was discussed.  Reducing her atenolol  dose was also discussed.  She denied chest pain and trouble with her breathing.  She was euvolemic.  Her atenolol was reduced to 25 mg twice daily.  Her amlodipine and Imdur were discontinued.  She contacted nurse triage line on 04/15/2022 and reported not feeling well and blood pressures fluctuating in the 150/98-109/62 and 94/83 range.  She indicated that she has been lightheaded throughout the day.  She denied chest pain and edema.  She reported that she felt better on amlodipine and Imdur.  She presented to the clinic 04/18/2022 for follow-up evaluation stated she had been noticing some elevated blood pressures in the evening.   Her blood pressure was well controlled.  We reviewed her recent changes with her blood pressure medication.  She and her son expressed understanding.  She also reported that she had been losing weight over the last few months.  Her weight was120.6 pounds.  Her blood pressure in the clinic was 100/60.  I reviewed her blood pressure log.  I prescribed amlodipine 2.5 mg for blood pressures greater than 253 systolic as needed.  I also decreased her furosemide to 40 mg in the morning and 20 mg afternoon.  She reported that she continued to stay as physically active as she could but had noticed more fatigue with blood pressure changes.  I  asked her to continue to get together with friends and be as social as she could.  We kept her follow-up as scheduled  with Dr. Audie Box.  She presents to the clinic today for follow-up evaluation states she has been having frequent falls in the evening.  She reports that she is even hit her head a couple times.  She continues to live alone.  She does not want to consider moving to assisted living.  She presents with her son who is concerned about risks for bleeding with falls.  She will be starting physical therapy to increase her balance and decrease falls.  Her blood pressure is fairly well controlled at home.  In the clinic today that it was 128/74.  I will continue her as needed amlodipine.  She had recent lab work drawn with her PCP.  Her TSH is elevated.  I instructed her to contact her PCP for adjustments in her Synthroid.  We will plan follow-up in 6 months.   Today she denies chest pain, shortness of breath, lower extremity edema, fatigue, palpitations, melena, hematuria, hemoptysis, diaphoresis, weakness, presyncope, syncope, orthopnea, and PND.   Home Medications    Prior to Admission medications   Medication Sig Start Date End Date Taking? Authorizing Provider  atenolol (TENORMIN) 25 MG tablet Take 1 tablet (25 mg total) by mouth 2 (two) times daily. 03/29/22   O'Neal, Cassie Freer, MD  Calcium Carbonate-Vitamin D (CALCIUM 600+D PO) Take 1 tablet by mouth in the morning and at bedtime.    [provider]  Cholecalciferol (VITAMIN D) 2000 UNITS tablet Take 4,000 Units by mouth daily.    [provider]  dorzolamide (TRUSOPT) 2 % ophthalmic solution Place 1 drop into the left eye 2 (two) times daily. 09/24/19   [provider]  ELIQUIS 2.5 MG TABS tablet TAKE 1 TABLET BY MOUTH  TWICE DAILY 11/12/21   [name], MD  furosemide (LASIX) 40 MG tablet TAKE 1 TABLET BY MOUTH  TWICE DAILY MAY TAKE AN  ADDITIONAL 40 MG AS NEEDED  FOR SWELLING OR WEIGHT GAIN 02/18/22   [name], MD  latanoprost (XALATAN) 0.005 % ophthalmic solution Place 1  drop into both eyes at bedtime.   02/23/13   [provider]  levothyroxine (SYNTHROID) 50 MCG tablet Take 1 tablet (50 mcg total) by mouth daily. 04/04/22   Fargo, Amy E, NP  Loperamide HCl (IMODIUM PO) Take 1 capsule by mouth 2 (two) times daily as needed.    [provider]  MAGNESIUM OXIDE 400 PO Take 800 mg by mouth daily.    [provider]  Multiple Vitamins-Minerals (CENTRUM SILVER PO) Take 1 tablet by mouth daily.    [provider]  Multiple Vitamins-Minerals (PRESERVISION AREDS 2 PO) Take 1 capsule by mouth 2 (two) times daily.     [provider]  nitroGLYCERIN (NITROSTAT) 0.4 MG SL tablet DISSOLVE 1 TABLET UNDER THE TONGUE EVERY 5 MINUTES FOR 3 DOSES AS NEEDED  FOR  CHEST  PAIN 12/23/18   [name], MD  omeprazole (PRILOSEC) 20 MG capsule Take 20 mg by mouth daily as needed.    [provider]  polyethylene glycol powder (GLYCOLAX/MIRALAX) 17 GM/SCOOP powder Take 17 g by mouth daily as needed for moderate constipation. Hold for loose stool 01/21/22   Ngetich, Dinah C, NP  potassium chloride (KLOR-CON) 10 MEQ tablet Take 10 mEq by mouth daily.    [provider]  pramipexole (MIRAPEX) 0.125 MG tablet Take 1 tablet (0.125 mg total) by mouth at bedtime. 02/21/22   Fargo, Amy E, NP  PREMARIN vaginal cream Place 2 g vaginally 2 (two) times a week. Uses on Sunday and wednesday 03/25/18   [provider]  Probiotic Product (PROBIOTIC DAILY PO) Take 1 tablet by mouth daily.    [provider]  sodium chloride (MURO 128) 5 % ophthalmic solution Place 1 drop into the right eye at bedtime.     [provider]  temazepam (RESTORIL) 15 MG capsule TAKE 1 CAPSULE(15 MG) BY MOUTH AT BEDTIME 03/15/22   Fargo, Amy E, NP    Family History    Family History  Problem Relation Age of Onset   Stroke Mother 48   Stroke Father 42   Arthritis Daughter    Gout Daughter    Immunocompromised Granddaughter    Heart attack Neg Hx    Heart disease Neg Hx     She indicated that her mother is deceased. She indicated that her father is deceased. She indicated that her maternal grandmother is deceased. She indicated that her maternal grandfather is deceased. She indicated that her paternal grandmother is deceased. She indicated that her paternal grandfather is deceased. She indicated that both of her daughters are alive. She indicated that her son is alive. She indicated that the status of her neg hx is unknown. She indicated that her granddaughter is alive.  Social History    Social History   Socioeconomic History   Marital status: Widowed    Spouse name: Not on file   Number of children: 3   Years of education: BA   Highest education level: Not on file  Occupational History    Employer: RETIRED   Occupation: Retired [employer]  Tobacco Use   Smoking status: Never   Smokeless tobacco: Never  Vaping Use   Vaping Use: Never used  Substance and Sexual Activity   Alcohol use: No   Drug use: No   Sexual activity: Not Currently  Other Topics Concern   Not on file  Social History Narrative   Pt lives at home alone.   Caffeine Use: quit 64yr ago  Diet: No Maceo Pro      Do you drink/ eat things with caffeineNo      Marital status:   Widowed                            What year were you married ? 1947      Do you live in a house, apartment,assistred living, condo, trailer, etc.)? [place]      Is it one or more stories? 1      How many persons live in your home ? Me      Do you have any pets in your home ?(please list) No      Highest Level of education completed:  BA [employer]      Current or past profession:  Pharmacist, hospital, Office, [employer]      Do you exercise?  No                            Type & how often       ADVANCED DIRECTIVES (Please bring copies)      Do you have a living will? Yes      Do you have a DNR form?   Yes                    If not, do you want to discuss one?       Do you have signed  POA?HPOA forms?  Yes               If so, please bring to your appointment      FUNCTIONAL STATUS- To be completed by Spouse / child / Staff       Do you have difficulty bathing or dressing yourself ?  No      Do you have difficulty preparing food or eating ?  No      Do you have difficulty managing your mediation ?  No      Do you have difficulty managing your finances ?  No      Do you have difficulty affording your medication ?  No      Social Determinants of Health   Financial Resource Strain: Low Risk  (05/06/2022)   Overall Financial Resource Strain (CARDIA)    Difficulty of Paying Living Expenses: Not hard at all  Food Insecurity: No Food Insecurity (05/06/2022)   Hunger Vital Sign    Worried About Running Out of Food in the Last Year: Never true    Ran Out of Food in the Last Year: Never true  Transportation Needs: No Transportation Needs (05/06/2022)   PRAPARE - Hydrologist (Medical): No    Lack of Transportation (Non-Medical): No  Physical Activity: Insufficiently Active (05/06/2022)   Exercise Vital Sign    Days of Exercise per Week: 2 days    Minutes of Exercise per Session: 10 min  Stress: No Stress Concern Present (05/06/2022)   [place]    Feeling of Stress : Not at all  Social Connections: Moderately Integrated (05/06/2022)   Social Connection and Isolation Panel [NHANES]    Frequency of Communication with Friends and Family: Three times a week    Frequency of Social Gatherings with Friends and Family: Three times a week    Attends Religious Services: 1 to 4  times per year    Active Member of Clubs or Organizations: Yes    Attends Archivist Meetings: More than 4 times per year    Marital Status: Widowed  Intimate Partner Violence: Not At Risk (08/07/2021)   Humiliation, Afraid, Rape, and Kick questionnaire    Fear of Current or Ex-Partner: No     Emotionally Abused: No    Physically Abused: No    Sexually Abused: No     Review of Systems    General:  No chills, fever, night sweats or weight changes.  Cardiovascular:  No chest pain, dyspnea on exertion, edema, orthopnea, palpitations, paroxysmal nocturnal dyspnea. Dermatological: No rash, lesions/masses Respiratory: No cough, dyspnea Urologic: No hematuria, dysuria Abdominal:   No nausea, vomiting, diarrhea, bright red blood per rectum, melena, or hematemesis Neurologic:  No visual changes, wkns, changes in mental status. All other systems reviewed and are otherwise negative except as noted above.  Physical Exam    VS:  BP 128/74 (BP Location: Left Arm, Patient Position: Sitting)   Pulse 81   Ht '4\' 11"'$  (1.499 m)   Wt 128 lb 6.4 oz (58.2 kg)   SpO2 97%   BMI 25.93 kg/m  , BMI Body mass index is 25.93 kg/m. GEN: Well nourished, well developed, in no acute distress. HEENT: normal. Neck: Supple, no JVD, carotid bruits, or masses. Cardiac: RRR, no murmurs, rubs, or gallops. No clubbing, cyanosis, edema.  Radials/DP/PT 2+ and equal bilaterally.  Respiratory:  Respirations regular and unlabored, clear to auscultation bilaterally. GI: Soft, nontender, nondistended, BS + x 4. MS: no deformity or atrophy. Skin: warm and dry, no rash. Neuro:  Strength and sensation are intact. Psych: Normal affect.  Accessory Clinical Findings    Recent Labs: 01/31/2022: ALT 15 05/20/2022: TSH 17.58 06/24/2022: BUN 9; Creat 0.79; Hemoglobin 15.3; Platelets 238; Potassium 4.2; Sodium 137   Recent Lipid Panel    Component Value Date/Time   CHOL 211 (H) 11/04/2012 1524   TRIG 201.0 (H) 11/04/2012 1524   HDL 66.80 11/04/2012 1524   CHOLHDL 3 11/04/2012 1524   VLDL 40.2 (H) 11/04/2012 1524   LDLCALC 109 (H) 04/16/2012 1043   LDLDIRECT 98.8 11/04/2012 1524         ECG personally reviewed by me today-atrial fibrillation septal infarct undetermined age 53 bpm   EKG 04/18/2022 atrial  fibrillation atrial septal infarct undetermined age 14 bpm- No acute changes  Echocardiogram 12/11/2019 IMPRESSIONS     1. Left ventricular ejection fraction, by estimation, is 70 to 75%. The  left ventricle has hyperdynamic function. The left ventricle has no  regional wall motion abnormalities. Left ventricular diastolic parameters  are indeterminate.   2. Right ventricular systolic function is normal. The right ventricular  size is normal.   3. Left atrial size was mildly dilated.   4. Right atrial size was mildly dilated.   5. The mitral valve is normal in structure. Trivial mitral valve  regurgitation. No evidence of mitral stenosis.   6. The aortic valve is tricuspid. Aortic valve regurgitation is trivial.  Mild to moderate aortic valve sclerosis/calcification is present, without  any evidence of aortic stenosis.   7. The inferior vena cava is dilated in size with <50% respiratory  variability, suggesting right atrial pressure of 15 mmHg.   Comparison(s): No significant change from prior study. Prior images  reviewed side by side.  Assessment & Plan   1. Essential hypertension-BP today 128/74.  Continues with labile blood pressures at home.  Continues to have have fatigue and low blood pressures intermittently.  Previously her amlodipine and Imdur were discontinued.  She contacted the nurse triage line on 04/15/2022 with complaints of lightheadedness and labile blood pressures.  Has noticed some improvement in her blood pressure with decreased furosemide dosing.  She has not needed any amlodipine. Continue  atenolol Continue amlodipine 2.5 mg as needed for systolic blood pressure greater than 140 Heart healthy low-sodium diet Continue blood pressure log  Chronic diastolic CHF-weight today 128.4 pounds .  Euvolemic today.  Continue  atenolol Continue furosemide to 40 mg a.m. and 20 mg p.m. Continue to monitor.  Chronic atrial fibrillation-heart rate 81.  CHA2DS2-VASc score 4.   (Hypertension, female, age 33).  Denies bleeding issues.  Reports compliance with apixaban.  Reports frequent falls in the evening.  Used shared decision making to discuss benefits and risks of continuing apixaban. Continue atenolol Stop apixaban Heart healthy low-sodium diet Maintain physical activity Continue to avoid triggers caffeine, chocolate, EtOH, dehydration etc.   Disposition: Follow-up with Dr. Audie Box or me in 6 months.   Jossie Ng. Minaal Struckman NP-C     07/03/2022, 11:26 AM [place] [address] Office [phone] Fax [phone]  Notice: This dictation was prepared with Dragon dictation along with smaller phrase technology. Any transcriptional errors that result from this process are unintentional and may not be corrected upon review.  I spent 15 minutes examining this patient, reviewing medications, and using patient centered shared decision making involving her cardiac care.  Prior to her visit I spent greater than 20 minutes reviewing her past medical history,  medications, and prior cardiac tests.

## 2022-07-03 ENCOUNTER — Other Ambulatory Visit: Payer: Self-pay

## 2022-07-03 ENCOUNTER — Encounter: Payer: Self-pay | Admitting: General Practice

## 2022-07-03 ENCOUNTER — Ambulatory Visit: Payer: Medicare Other | Attending: Cardiovascular Disease | Admitting: General Practice

## 2022-07-03 VITALS — BP 128/74 | HR 81 | Ht 59.0 in | Wt 128.4 lb

## 2022-07-03 DIAGNOSIS — I4821 Permanent atrial fibrillation: Secondary | ICD-10-CM | POA: Diagnosis not present

## 2022-07-03 DIAGNOSIS — I5032 Chronic diastolic (congestive) heart failure: Secondary | ICD-10-CM | POA: Diagnosis not present

## 2022-07-03 DIAGNOSIS — I10 Essential (primary) hypertension: Secondary | ICD-10-CM

## 2022-07-03 MED ORDER — LEVOTHYROXINE SODIUM 75 MCG PO TABS: 75.0000 ug | ORAL_TABLET | Freq: Every day | ORAL | 3 refills | Status: DC

## 2022-07-03 NOTE — Telephone Encounter (Signed)
Patient states that pharmacy doesn't have Levothyroxine 58mg because we didn't order it. Medication sent back into pharmacy.

## 2022-07-03 NOTE — Patient Instructions (Signed)
Medication Instructions:  Your physician has recommended you make the following change in your medication: STOP Eliquis   *If you need a refill on your cardiac medications before your next appointment, please call your pharmacy*   Lab Work: none If you have labs (blood work) drawn today and your tests are completely normal, you will receive your results only by: [place] (if you have MyChart) OR A paper copy in the mail If you have any lab test that is abnormal or we need to change your treatment, we will call you to review the results.   Testing/Procedures: none   Follow-Up: At [facility], you and your health needs are our priority.  As part of our continuing mission to provide you with exceptional heart care, we have created designated Provider Care Teams.  These Care Teams include your primary Cardiologist (physician) and Advanced Practice Providers (APPs -  Physician Assistants and Nurse Practitioners) who all work together to provide you with the care you need, when you need it.  We recommend signing up for the patient portal called "MyChart".  Sign up information is provided on this After Visit Summary.  MyChart is used to connect with patients for Virtual Visits (Telemedicine).  Patients are able to view lab/test results, encounter notes, upcoming appointments, etc.  Non-urgent messages can be sent to your provider as well.   To learn more about what you can do with MyChart, go to [email or web address].    Your next appointment:   6 month(s)  The format for your next appointment:   In Person  Provider:   Coletta Memos, FNP        Other Instructions   Important Information About Sugar

## 2022-07-08 ENCOUNTER — Emergency Department (HOSPITAL_COMMUNITY): Payer: Medicare Other

## 2022-07-08 ENCOUNTER — Inpatient Hospital Stay (HOSPITAL_COMMUNITY): Payer: Medicare Other

## 2022-07-08 ENCOUNTER — Encounter (HOSPITAL_COMMUNITY): Payer: Self-pay | Admitting: Emergency Medicine

## 2022-07-08 ENCOUNTER — Other Ambulatory Visit: Payer: Self-pay

## 2022-07-08 ENCOUNTER — Inpatient Hospital Stay (HOSPITAL_COMMUNITY)
Admission: EM | Admit: 2022-07-08 | Discharge: 2022-07-16 | DRG: 065 | Disposition: A | Discharge status: Skilled Nursing Facility | Payer: Medicare Other | Attending: Neurology | Admitting: Neurology

## 2022-07-08 DIAGNOSIS — I639 Cerebral infarction, unspecified: Secondary | ICD-10-CM | POA: Diagnosis not present

## 2022-07-08 DIAGNOSIS — R42 Dizziness and giddiness: Secondary | ICD-10-CM | POA: Diagnosis not present

## 2022-07-08 DIAGNOSIS — W19XXXA Unspecified fall, initial encounter: Secondary | ICD-10-CM | POA: Diagnosis present

## 2022-07-08 DIAGNOSIS — I252 Old myocardial infarction: Secondary | ICD-10-CM

## 2022-07-08 DIAGNOSIS — R296 Repeated falls: Secondary | ICD-10-CM | POA: Diagnosis not present

## 2022-07-08 DIAGNOSIS — E039 Hypothyroidism, unspecified: Secondary | ICD-10-CM | POA: Diagnosis present

## 2022-07-08 DIAGNOSIS — E7800 Pure hypercholesterolemia, unspecified: Secondary | ICD-10-CM | POA: Diagnosis present

## 2022-07-08 DIAGNOSIS — M6281 Muscle weakness (generalized): Secondary | ICD-10-CM | POA: Diagnosis not present

## 2022-07-08 DIAGNOSIS — I651 Occlusion and stenosis of basilar artery: Secondary | ICD-10-CM | POA: Diagnosis not present

## 2022-07-08 DIAGNOSIS — E890 Postprocedural hypothyroidism: Secondary | ICD-10-CM | POA: Diagnosis not present

## 2022-07-08 DIAGNOSIS — K219 Gastro-esophageal reflux disease without esophagitis: Secondary | ICD-10-CM | POA: Diagnosis present

## 2022-07-08 DIAGNOSIS — I700 Atherosclerosis of aorta: Secondary | ICD-10-CM | POA: Diagnosis present

## 2022-07-08 DIAGNOSIS — Z9049 Acquired absence of other specified parts of digestive tract: Secondary | ICD-10-CM

## 2022-07-08 DIAGNOSIS — Z823 Family history of stroke: Secondary | ICD-10-CM

## 2022-07-08 DIAGNOSIS — Z90710 Acquired absence of both cervix and uterus: Secondary | ICD-10-CM | POA: Diagnosis not present

## 2022-07-08 DIAGNOSIS — Z881 Allergy status to other antibiotic agents status: Secondary | ICD-10-CM

## 2022-07-08 DIAGNOSIS — I69398 Other sequelae of cerebral infarction: Secondary | ICD-10-CM | POA: Diagnosis not present

## 2022-07-08 DIAGNOSIS — R404 Transient alteration of awareness: Secondary | ICD-10-CM | POA: Diagnosis not present

## 2022-07-08 DIAGNOSIS — R2689 Other abnormalities of gait and mobility: Secondary | ICD-10-CM | POA: Diagnosis not present

## 2022-07-08 DIAGNOSIS — I6381 Other cerebral infarction due to occlusion or stenosis of small artery: Secondary | ICD-10-CM | POA: Diagnosis not present

## 2022-07-08 DIAGNOSIS — Z9181 History of falling: Secondary | ICD-10-CM

## 2022-07-08 DIAGNOSIS — I5032 Chronic diastolic (congestive) heart failure: Secondary | ICD-10-CM | POA: Diagnosis not present

## 2022-07-08 DIAGNOSIS — R29818 Other symptoms and signs involving the nervous system: Secondary | ICD-10-CM | POA: Diagnosis not present

## 2022-07-08 DIAGNOSIS — E876 Hypokalemia: Secondary | ICD-10-CM | POA: Diagnosis not present

## 2022-07-08 DIAGNOSIS — I110 Hypertensive heart disease with heart failure: Secondary | ICD-10-CM | POA: Diagnosis not present

## 2022-07-08 DIAGNOSIS — I4891 Unspecified atrial fibrillation: Secondary | ICD-10-CM | POA: Diagnosis present

## 2022-07-08 DIAGNOSIS — I2510 Atherosclerotic heart disease of native coronary artery without angina pectoris: Secondary | ICD-10-CM | POA: Diagnosis not present

## 2022-07-08 DIAGNOSIS — I499 Cardiac arrhythmia, unspecified: Secondary | ICD-10-CM | POA: Diagnosis not present

## 2022-07-08 DIAGNOSIS — I69891 Dysphagia following other cerebrovascular disease: Secondary | ICD-10-CM | POA: Diagnosis not present

## 2022-07-08 DIAGNOSIS — Z85850 Personal history of malignant neoplasm of thyroid: Secondary | ICD-10-CM

## 2022-07-08 DIAGNOSIS — H532 Diplopia: Secondary | ICD-10-CM | POA: Diagnosis not present

## 2022-07-08 DIAGNOSIS — Z79899 Other long term (current) drug therapy: Secondary | ICD-10-CM

## 2022-07-08 DIAGNOSIS — Z888 Allergy status to other drugs, medicaments and biological substances status: Secondary | ICD-10-CM

## 2022-07-08 DIAGNOSIS — R531 Weakness: Secondary | ICD-10-CM | POA: Diagnosis not present

## 2022-07-08 DIAGNOSIS — M25551 Pain in right hip: Secondary | ICD-10-CM | POA: Diagnosis not present

## 2022-07-08 DIAGNOSIS — I6389 Other cerebral infarction: Secondary | ICD-10-CM | POA: Diagnosis not present

## 2022-07-08 DIAGNOSIS — Q233 Congenital mitral insufficiency: Secondary | ICD-10-CM

## 2022-07-08 DIAGNOSIS — I69828 Other speech and language deficits following other cerebrovascular disease: Secondary | ICD-10-CM | POA: Diagnosis not present

## 2022-07-08 DIAGNOSIS — Z886 Allergy status to analgesic agent status: Secondary | ICD-10-CM

## 2022-07-08 DIAGNOSIS — I771 Stricture of artery: Secondary | ICD-10-CM | POA: Diagnosis not present

## 2022-07-08 DIAGNOSIS — I482 Chronic atrial fibrillation, unspecified: Secondary | ICD-10-CM | POA: Diagnosis not present

## 2022-07-08 DIAGNOSIS — Z79890 Hormone replacement therapy (postmenopausal): Secondary | ICD-10-CM

## 2022-07-08 DIAGNOSIS — R2681 Unsteadiness on feet: Secondary | ICD-10-CM | POA: Diagnosis not present

## 2022-07-08 DIAGNOSIS — Z743 Need for continuous supervision: Secondary | ICD-10-CM | POA: Diagnosis not present

## 2022-07-08 DIAGNOSIS — R29700 NIHSS score 0: Secondary | ICD-10-CM | POA: Diagnosis not present

## 2022-07-08 DIAGNOSIS — I4819 Other persistent atrial fibrillation: Secondary | ICD-10-CM | POA: Diagnosis not present

## 2022-07-08 DIAGNOSIS — H3530 Unspecified macular degeneration: Secondary | ICD-10-CM | POA: Diagnosis present

## 2022-07-08 DIAGNOSIS — E785 Hyperlipidemia, unspecified: Secondary | ICD-10-CM | POA: Diagnosis not present

## 2022-07-08 DIAGNOSIS — Z885 Allergy status to narcotic agent status: Secondary | ICD-10-CM

## 2022-07-08 DIAGNOSIS — R6889 Other general symptoms and signs: Secondary | ICD-10-CM | POA: Diagnosis not present

## 2022-07-08 DIAGNOSIS — I10 Essential (primary) hypertension: Secondary | ICD-10-CM | POA: Diagnosis present

## 2022-07-08 DIAGNOSIS — Z1152 Encounter for screening for COVID-19: Secondary | ICD-10-CM | POA: Diagnosis not present

## 2022-07-08 DIAGNOSIS — I6312 Cerebral infarction due to embolism of basilar artery: Secondary | ICD-10-CM | POA: Diagnosis not present

## 2022-07-08 DIAGNOSIS — R918 Other nonspecific abnormal finding of lung field: Secondary | ICD-10-CM | POA: Diagnosis not present

## 2022-07-08 LAB — URINALYSIS, ROUTINE W REFLEX MICROSCOPIC
Bilirubin Urine: NEGATIVE
Glucose, UA: NEGATIVE mg/dL
Hgb urine dipstick: NEGATIVE
Ketones, ur: NEGATIVE mg/dL
Leukocytes,Ua: NEGATIVE
Nitrite: NEGATIVE
Protein, ur: NEGATIVE mg/dL
Specific Gravity, Urine: 1.006 (ref 1.005–1.030)
pH: 8 (ref 5.0–8.0)

## 2022-07-08 LAB — CBC WITH DIFFERENTIAL/PLATELET
Abs Immature Granulocytes: 0.02 K/uL (ref 0.00–0.07)
Basophils Absolute: 0 K/uL (ref 0.0–0.1)
Basophils Relative: 0 %
Eosinophils Absolute: 0.1 K/uL (ref 0.0–0.5)
Eosinophils Relative: 1 %
HCT: 45.3 % (ref 36.0–46.0)
Hemoglobin: 15 g/dL (ref 12.0–15.0)
Immature Granulocytes: 0 %
Lymphocytes Relative: 30 %
Lymphs Abs: 2.2 K/uL (ref 0.7–4.0)
MCH: 32.2 pg (ref 26.0–34.0)
MCHC: 33.1 g/dL (ref 30.0–36.0)
MCV: 97.2 fL (ref 80.0–100.0)
Monocytes Absolute: 0.5 K/uL (ref 0.1–1.0)
Monocytes Relative: 7 %
Neutro Abs: 4.5 K/uL (ref 1.7–7.7)
Neutrophils Relative %: 62 %
Platelets: 230 K/uL (ref 150–400)
RBC: 4.66 MIL/uL (ref 3.87–5.11)
RDW: 15.5 % (ref 11.5–15.5)
WBC: 7.3 K/uL (ref 4.0–10.5)
nRBC: 0 % (ref 0.0–0.2)

## 2022-07-08 LAB — MAGNESIUM: Magnesium: 1.8 mg/dL (ref 1.7–2.4)

## 2022-07-08 LAB — BASIC METABOLIC PANEL WITH GFR
Anion gap: 6 (ref 5–15)
BUN: 9 mg/dL (ref 8–23)
CO2: 27 mmol/L (ref 22–32)
Calcium: 7.6 mg/dL — ABNORMAL LOW (ref 8.9–10.3)
Chloride: 106 mmol/L (ref 98–111)
Creatinine, Ser: 0.65 mg/dL (ref 0.44–1.00)
GFR, Estimated: >60 mL/min
Glucose, Bld: 125 mg/dL — ABNORMAL HIGH (ref 70–99)
Potassium: 3.5 mmol/L (ref 3.5–5.1)
Sodium: 139 mmol/L (ref 135–145)

## 2022-07-08 LAB — BRAIN NATRIURETIC PEPTIDE: B Natriuretic Peptide: 359.8 pg/mL — ABNORMAL HIGH (ref 0.0–100.0)

## 2022-07-08 LAB — T4, FREE: Free T4: 0.91 ng/dL (ref 0.61–1.12)

## 2022-07-08 LAB — CREATININE, SERUM
Creatinine, Ser: 0.68 mg/dL (ref 0.44–1.00)
GFR, Estimated: >60 mL/min (ref >60)

## 2022-07-08 LAB — TSH: TSH: 19.195 u[IU]/mL — ABNORMAL HIGH (ref 0.350–4.500)

## 2022-07-08 LAB — RESP PANEL BY RT-PCR (FLU A&B, COVID) ARPGX2
Influenza A by PCR: NEGATIVE
Influenza B by PCR: NEGATIVE
SARS Coronavirus 2 by RT PCR: NEGATIVE

## 2022-07-08 LAB — TROPONIN I (HIGH SENSITIVITY): Troponin I (High Sensitivity): 14 ng/L (ref <18)

## 2022-07-08 MED ORDER — METOPROLOL TARTRATE 5 MG/5ML IV SOLN: 5.0000 mg | Freq: Four times a day (QID) | INTRAVENOUS | Status: DC | PRN

## 2022-07-08 MED ORDER — ACETAMINOPHEN 325 MG PO TABS
650.0000 mg | ORAL_TABLET | ORAL | Status: DC | PRN
  Administered 2022-07-11: 325 mg via ORAL
  Administered 2022-07-12 (×2): 650 mg via ORAL
  Filled 2022-07-08 (×4): qty 2

## 2022-07-08 MED ORDER — ACETAMINOPHEN 160 MG/5ML PO SOLN: 650.0000 mg | ORAL | Status: DC | PRN

## 2022-07-08 MED ORDER — ONDANSETRON HCL 4 MG/2ML IJ SOLN: 4.0000 mg | Freq: Once | INTRAMUSCULAR | Status: DC

## 2022-07-08 MED ORDER — TEMAZEPAM 15 MG PO CAPS
15.0000 mg | ORAL_CAPSULE | Freq: Every evening | ORAL | Status: DC | PRN
  Administered 2022-07-10 – 2022-07-15 (×5): 15 mg via ORAL
  Filled 2022-07-08 (×5): qty 1

## 2022-07-08 MED ORDER — IOHEXOL 350 MG/ML SOLN
75.0000 mL | Freq: Once | INTRAVENOUS | Status: AC | PRN
  Administered 2022-07-08: 75 mL via INTRAVENOUS

## 2022-07-08 MED ORDER — ADULT MULTIVITAMIN W/MINERALS CH
1.0000 | ORAL_TABLET | Freq: Every day | ORAL | Status: DC
  Administered 2022-07-10 – 2022-07-16 (×7): 1 via ORAL
  Filled 2022-07-08 (×8): qty 1

## 2022-07-08 MED ORDER — SODIUM CHLORIDE 0.9 % IV BOLUS
1000.0000 mL | Freq: Once | INTRAVENOUS | Status: AC
  Administered 2022-07-08: 1000 mL via INTRAVENOUS

## 2022-07-08 MED ORDER — LATANOPROST 0.005 % OP SOLN
1.0000 [drp] | Freq: Every day | OPHTHALMIC | Status: DC
  Administered 2022-07-09 – 2022-07-15 (×8): 1 [drp] via OPHTHALMIC
  Filled 2022-07-08: qty 2.5

## 2022-07-08 MED ORDER — ENOXAPARIN SODIUM 30 MG/0.3ML IJ SOSY
30.0000 mg | PREFILLED_SYRINGE | INTRAMUSCULAR | Status: DC
  Administered 2022-07-08: 30 mg via SUBCUTANEOUS
  Filled 2022-07-08: qty 0.3

## 2022-07-08 MED ORDER — DORZOLAMIDE HCL 2 % OP SOLN
1.0000 [drp] | Freq: Two times a day (BID) | OPHTHALMIC | Status: DC
  Administered 2022-07-09 – 2022-07-16 (×15): 1 [drp] via OPHTHALMIC
  Filled 2022-07-08 (×2): qty 10

## 2022-07-08 MED ORDER — SODIUM CHLORIDE 0.9 % IV SOLN: INTRAVENOUS | Status: AC

## 2022-07-08 MED ORDER — SODIUM CHLORIDE (HYPERTONIC) 5 % OP SOLN
1.0000 [drp] | Freq: Every day | OPHTHALMIC | Status: DC
  Administered 2022-07-09 – 2022-07-15 (×8): 1 [drp] via OPHTHALMIC
  Filled 2022-07-08: qty 15

## 2022-07-08 MED ORDER — STROKE: EARLY STAGES OF RECOVERY BOOK
Freq: Once | Status: AC
  Filled 2022-07-08: qty 1

## 2022-07-08 MED ORDER — LEVOTHYROXINE SODIUM 75 MCG PO TABS
75.0000 ug | ORAL_TABLET | Freq: Every day | ORAL | Status: DC
  Administered 2022-07-09 – 2022-07-16 (×8): 75 ug via ORAL
  Filled 2022-07-08 (×8): qty 1

## 2022-07-08 MED ORDER — PANTOPRAZOLE SODIUM 40 MG PO TBEC
40.0000 mg | DELAYED_RELEASE_TABLET | Freq: Every day | ORAL | Status: DC
  Administered 2022-07-08 – 2022-07-16 (×9): 40 mg via ORAL
  Filled 2022-07-08 (×9): qty 1

## 2022-07-08 MED ORDER — LORAZEPAM 2 MG/ML IJ SOLN
1.0000 mg | Freq: Once | INTRAMUSCULAR | Status: AC
  Administered 2022-07-08: 1 mg via INTRAVENOUS
  Filled 2022-07-08: qty 1

## 2022-07-08 MED ORDER — SENNOSIDES-DOCUSATE SODIUM 8.6-50 MG PO TABS
1.0000 | ORAL_TABLET | Freq: Every evening | ORAL | Status: DC | PRN
  Administered 2022-07-10: 1 via ORAL
  Filled 2022-07-08: qty 1

## 2022-07-08 MED ORDER — ACETAMINOPHEN 650 MG RE SUPP: 650.0000 mg | RECTAL | Status: DC | PRN

## 2022-07-08 NOTE — ED Notes (Signed)
EDP notified about pt's run of Afib in 140s.

## 2022-07-08 NOTE — ED Notes (Signed)
Neurologist and rapid response RN at bedside.

## 2022-07-08 NOTE — H&P (Signed)
NEUROLOGY CONSULTATION NOTE   Date of service: July 08, 2022 Patient Name: Kristen Whitehead MRN:  [id number] DOB:  Sep 02, 1922  _ _ _   _ __   _ __ _ _  __ __   _ __   __ _  History of Present Illness  Kristen Whitehead is a 88 y.o. female with PMH significant for chronic atrial fibrillation who is currently not on anticoagulation due to history of multiple falls, GERD, hypercholesterolemia, hypertension, hypothyroidism, macular degeneration of bilateral eyes with left worse than right who was recently taken off of her Eliquis for multiple falls.  Patient went to bed last night at 0200 07/08/2022.  She woke up at 11 AM 07/08/2022 and was able to get up from the bed and walk fine.  She went to take her medications for the morning around 11:30 AM and felt dizzy and nauseous and had to sit down.  She reports that since then she has been experiencing generalized weakness worse with trying to stand up.  She called her family who eventually brought her to the hospital for further evaluation and work-up.  Family also reports that she has been not as talkative and sometimes her speech would be garbled intermittently but seem fine other times.  Patient reports that she is experiencing double vision which again is intermittent.  In the ED, she had work-up with CT head without contrast which was negative for any acute abnormality.  CT angio head and neck demonstrated a thrombus at the tip of the basilar artery.  LKW: 11:30 AM on 07/08/2022. mRS: 0 tNKASE: Not offered as she is outside the window. Thrombectomy: Discussed risks and benefits of thrombectomy along with alternatives including careful observation. This was discussed by me earlier and later by Dr. Margarita Sermons with patient and her son at the bedside. At this time, given her symptoms are mild, patient agreed with careful observation in the ICU. However, if patient was to experience worsening of her symptoms or felt her symptoms are more  disabling, she is agreeable to thrombectomy. Dr. Ethel Rana rodrigues consented patient over the phone just in vase if she was to have worsening of her deficit. Patient or her family did not have any additional questions. Will admit her to the ICU for close observation.  NIHSS components Score: Comment  1a Level of Conscious 0'[x]'$  1'[]'$  2'[]'$  3'[]'$      1b LOC Questions 0'[x]'$  1'[]'$  2'[]'$       1c LOC Commands 0'[x]'$  1'[]'$  2'[]'$       2 Best Gaze 0'[x]'$  1'[]'$  2'[]'$       3 Visual 0'[x]'$  1'[]'$  2'[]'$  3'[]'$      4 Facial Palsy 0'[x]'$  1'[]'$  2'[]'$  3'[]'$      5a Motor Arm - left 0'[x]'$  1'[]'$  2'[]'$  3'[]'$  4'[]'$  UN'[]'$    5b Motor Arm - Right 0'[x]'$  1'[]'$  2'[]'$  3'[]'$  4'[]'$  UN'[]'$    6a Motor Leg - Left 0'[x]'$  1'[]'$  2'[]'$  3'[]'$  4'[]'$  UN'[]'$    6b Motor Leg - Right 0'[x]'$  1'[]'$  2'[]'$  3'[]'$  4'[]'$  UN'[]'$    7 Limb Ataxia 0'[x]'$  1'[]'$  2'[]'$  3'[]'$  UN'[]'$     8 Sensory 0'[x]'$  1'[]'$  2'[]'$  UN'[]'$      9 Best Language 0'[x]'$  1'[]'$  2'[]'$  3'[]'$      10 Dysarthria 0'[x]'$  1'[]'$  2'[]'$  UN'[]'$      11 Extinct. and Inattention 0'[x]'$  1'[]'$  2'[]'$       TOTAL: 0       ROS   Constitutional Denies weight loss, fever and chills.   HEENT Endorses diplopia but no problems with hearing.  Respiratory Denies SOB and cough.   CV Denies palpitations and CP   GI Denies abdominal pain, vomiting and diarrhea. Mild nausea earlier has resolved.  GU Denies dysuria and urinary frequency.   MSK Denies myalgia and joint pain.   Skin Denies rash and pruritus.   Neurological Denies headache and syncope.   Psychiatric Denies recent changes in mood. Denies anxiety and depression.    Past History   Past Medical History:  Diagnosis Date   Bladder prolapse, female, acquired    Cancer [facility])    thyroid   Chest pain    no known ischemic heart disease; negative Myoview July 2013. EF 75% with no ischemia.    Chronic anticoagulation    Chronic atrial fibrillation (HCC)    managed with rate control and coumadin   Chronic diastolic heart failure (HCC)    GERD (gastroesophageal reflux disease)    Heart disease    History of congenital mitral regurgitation    mild    History of heart attack    Multiple    History of mitral valve prolapse 06/15/2008   a. echo 1/14: mild LVH, EF 60-65%, mild MR, mild to mod BAE, PASP 35   Hypercholesterolemia    Hypertension    Hypothyroidism    Past Surgical History:  Procedure Laterality Date   ABDOMINAL HYSTERECTOMY     CATARACT EXTRACTION     CHOLECYSTECTOMY     Thyroidectomy     TONSILLECTOMY     Family History  Problem Relation Age of Onset   Stroke Mother 40   Stroke Father 46   Arthritis Daughter    Gout Daughter    Immunocompromised Granddaughter    Heart attack Neg Hx    Heart disease Neg Hx    Social History   Socioeconomic History   Marital status: Widowed    Spouse name: Not on file   Number of children: 3   Years of education: BA   Highest education level: Not on file  Occupational History    Employer: RETIRED   Occupation: Retired [employer]  Tobacco Use   Smoking status: Never   Smokeless tobacco: Never  Vaping Use   Vaping Use: Never used  Substance and Sexual Activity   Alcohol use: No   Drug use: No   Sexual activity: Not Currently  Other Topics Concern   Not on file  Social History Narrative   Pt lives at home alone.   Caffeine Use: quit 94yr ago      Diet: No FMaceo Pro     Do you drink/ eat things with caffeineNo      Marital status:   Widowed                            What year were you married ? 1947      Do you live in a house, apartment,assistred living, condo, trailer, etc.)? [place]     Is it one or more stories? 1      How many persons live in your home ? Me      Do you have any pets in your home ?(please list) No      Highest Level of education completed:  BA [employer]     Current or past profession:  TPharmacist, hospital Office, [employer]      Do you exercise?  No  Type & how often       ADVANCED DIRECTIVES (Please bring copies)      Do you have a living will? Yes      Do you have a DNR form?   Yes                     If not, do you want to discuss one?       Do you have signed POA?HPOA forms?  Yes               If so, please bring to your appointment      FUNCTIONAL STATUS- To be completed by Spouse / child / Staff       Do you have difficulty bathing or dressing yourself ?  No      Do you have difficulty preparing food or eating ?  No      Do you have difficulty managing your mediation ?  No      Do you have difficulty managing your finances ?  No      Do you have difficulty affording your medication ?  No      Social Determinants of Health   Financial Resource Strain: Low Risk  (05/06/2022)   Overall Financial Resource Strain (CARDIA)    Difficulty of Paying Living Expenses: Not hard at all  Food Insecurity: No Food Insecurity (05/06/2022)   Hunger Vital Sign    Worried About Running Out of Food in the Last Year: Never true    Ran Out of Food in the Last Year: Never true  Transportation Needs: No Transportation Needs (05/06/2022)   PRAPARE - Hydrologist (Medical): No    Lack of Transportation (Non-Medical): No  Physical Activity: Insufficiently Active (05/06/2022)   Exercise Vital Sign    Days of Exercise per Week: 2 days    Minutes of Exercise per Session: 10 min  Stress: No Stress Concern Present (05/06/2022)   [place]    Feeling of Stress : Not at all  Social Connections: Moderately Integrated (05/06/2022)   Social Connection and Isolation Panel [NHANES]    Frequency of Communication with Friends and Family: Three times a week    Frequency of Social Gatherings with Friends and Family: Three times a week    Attends Religious Services: 1 to 4 times per year    Active Member of Clubs or Organizations: Yes    Attends Archivist Meetings: More than 4 times per year    Marital Status: Widowed   Allergies  Allergen Reactions   Azithromycin Nausea Only and Other (See Comments)    Compazine [Prochlorperazine Edisylate] Nausea Only   Demerol Nausea Only   Dolophine [Methadone] Nausea Only   Ebastine Nausea Only    EBS   Erythromycin Nausea Only and Other (See Comments)   Ibuprofen Nausea Only and Other (See Comments)   Meperidine Hcl Other (See Comments)   Morphine Sulfate Other (See Comments)   Statins Other (See Comments)    myalgias   Tetanus Toxoids Nausea Only   Tetracycline Other (See Comments)   Tetracyclines & Related Nausea Only    Medications  (Not in a hospital admission)    Vitals   Vitals:   07/08/22 2010 07/08/22 2015 07/08/22 2045 07/08/22 2115  BP:  (!) 142/93 (!) 158/100 (!) 166/83  Pulse:  78  61  Resp: '15 17 17 '$ 15  Temp:      TempSrc:      SpO2: 94% 93% 93% 93%  Weight:      Height:         Body mass index is 25.25 kg/m.  Physical Exam   General: Laying comfortably in bed; in no acute distress.  HENT: Normal oropharynx and mucosa. Normal external appearance of ears and nose.  Neck: Supple, no pain or tenderness  CV: No JVD. No peripheral edema.  Pulmonary: Symmetric Chest rise. Normal respiratory effort.  Abdomen: Soft to touch, non-tender.  Ext: No cyanosis, edema, or deformity  Skin: No rash. Normal palpation of skin.   Musculoskeletal: Normal digits and nails by inspection. No clubbing.   Neurologic Examination  Mental status/Cognition: Alert, oriented to self, place, month and year, good attention.  Speech/language: Fluent, comprehension intact, object naming intact, repetition intact.  Cranial nerves:   CN II Pupils equal and reactive to light, no VF deficits    CN III,IV,VI No gaze preference or deviation, no nystagmus. Left INO   CN V normal sensation in V1, V2, and V3 segments bilaterally    CN VII no asymmetry, no nasolabial fold flattening    CN VIII normal hearing to speech    CN IX & X normal palatal elevation, no uvular deviation    CN XI 5/5 head turn and 5/5 shoulder shrug bilaterally    CN XII  midline tongue protrusion    Motor:  Muscle bulk: poor, tone normal, pronator drift none tremor none Mvmt Root Nerve  Muscle Right Left Comments  SA C5/6 Ax Deltoid 5 5   EF C5/6 Mc Biceps 5 5   EE C6/7/8 Rad Triceps 5 5   WF C6/7 Med FCR     WE C7/8 PIN ECU     F Ab C8/T1 U ADM/FDI 5 5   HF L1/2/3 Fem Illopsoas 5 5   KE L2/3/4 Fem Quad 4 4 Pain in BL knees  DF L4/5 D Peron Tib Ant 5 5   PF S1/2 Tibial Grc/Sol 5 5    Sensation:  Light touch Intact throughout   Pin prick    Temperature    Vibration   Proprioception    Coordination/Complex Motor:  - Finger to Nose intact BL - Heel to shin limited by pain but no obvious ataxia. - Rapid alternating movement are slowed - Gait: Deferred for now.  Labs   CBC:  Recent Labs  Lab 07/08/22 1448  WBC 7.3  NEUTROABS 4.5  HGB 15.0  HCT 45.3  MCV 97.2  PLT 209    Basic Metabolic Panel:  Lab Results  Component Value Date   NA 139 07/08/2022   K 3.5 07/08/2022   CO2 27 07/08/2022   GLUCOSE 125 (H) 07/08/2022   BUN 9 07/08/2022   CREATININE 0.65 07/08/2022   CALCIUM 7.6 (L) 07/08/2022   GFRNONAA >60 07/08/2022   GFRAA 70 01/27/2020   Lipid Panel:  Lab Results  Component Value Date   LDLCALC 109 (H) 04/16/2012   HgbA1c: No results found for: "HGBA1C" Urine Drug Screen: No results found for: "LABOPIA", "COCAINSCRNUR", "LABBENZ", "AMPHETMU", "THCU", "LABBARB"  Alcohol Level     Component Value Date/Time   ETH <10 12/09/2019 1907    CT Head without contrast(Personally reviewed): CTH was negative for a large hypodensity concerning for a large territory infarct or hyperdensity concerning for an ICH  CT angio Head and Neck with contrast(Personally reviewed): Distal tip of basilar occlusion with non opacification of  R SCA.  MRI Brain: pending  Impression   Kristen Whitehead is a 88 y.o. female with PMH significant for chronic atrial fibrillation who is currently not on anticoagulation due to history of multiple  falls, GERD, hypercholesterolemia, hypertension, hypothyroidism, macular degeneration of bilateral eyes with left worse than right who was recently taken off of her Eliquis for multiple falls. She presents with intermittent nausea, generalized weakness, episode of aphasia earlier and diplopia. She was found to have tip of the basilar thrombus. She was outside tnkase window on arrival. However, despite the noted LVO, she only has left INO on exam. Given symptoms are too mild, she opted to only undergo thrombectomy as a last resort and opted for close monitoring of her symptoms. We did discuss that if patient was to worsen, would take her to thrombectomy right away and she is agreeable and consented to that.  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   Secondary Diagnosis: Chronic atrial fibrillation and Hypocalcemia  Recommendations  Acute basilar artery thrombosis: - Frequent Neuro checks per stroke unit protocol - Recommend brain imaging with MRI Brain without contrast - TTE - LDL - HbA1c - Antithrombotic - Aspirin '81mg'$  daily - Recommend DVT ppx - SBP goal - permissive hypertension < 220/110. Hold home antihypetensives. - Recommend Telemetry monitoring for arrythmia - Recommend bedside swallow screen prior to PO intake. - Stroke education booklet - Recommend PT/OT/SLP consult - Close observation in the ICU ith Q1Hour Neuro checks. - Call a stroke code if she was to have change in NIHSs or AMS.   Chronic Afibb: - not on AC 2/2 hx of falls.  Hypocalcemia: - repeat chemistry in AM  Hypothyroidism: - cont home synthroid.  GERD: - cont home PPI   Macular degeneration: - continue home eye drops.  HLD: - allergic to statins. Consider zetia or PCSK 9 inhibitors outpatient.  ______________________________________________________________________  This patient is critically ill and at significant risk of neurological worsening, death and care  requires constant monitoring of vital signs, hemodynamics,respiratory and cardiac monitoring, neurological assessment, discussion with family, other specialists and medical decision making of high complexity. I spent 60 minutes of neurocritical care time  in the care of  this patient. This was time spent independent of any time provided by nurse practitioner or PA.  Donnetta Simpers [facility] Pager Number [phone] 07/08/2022  10:17 PM   Thank you for the opportunity to take part in the care of this patient. If you have any further questions, please contact the neurology consultation attending.  Signed,  [place] Pager Number [phone] _ _ _   _ __   _ __ _ _  __ __   _ __   __ _

## 2022-07-08 NOTE — ED Notes (Signed)
Patient transported to CT 

## 2022-07-08 NOTE — ED Notes (Signed)
Pt not nauseous at this time. The pt will call out if she experiences nausea again.

## 2022-07-08 NOTE — ED Provider Notes (Signed)
[facility] EMERGENCY DEPARTMENT Provider Note   CSN: [id number] Arrival date & time: 07/08/22  1441     History {Add pertinent medical, surgical, social history, OB history to HPI:1} No chief complaint on file.   Kristen Whitehead is a 88 y.o. female.  Patient as above with significant medical history as below, including afib no longer on DOAC as of 3 days ago, hld, MVP, HTN, hypothyroid who presents to the ED with complaint of ams, light headed. Pt lives alone, accompanied by family at bedside. Per pt she was about to take her medications around 12-1pm today and she felt light headed, thought her heart was racing, thought she might pass out. She sat down in her chair and felt a little better but pressed her life alert necklace and called for help. Symptoms have mildly improved but she has not returned to her baseline. Per family she is typically awake and "sharp as a tack" but over the past 24 hours is more sleepy than normal, not speaking as clearly as she normally would. Her LKN was around 7pm last night when family last spoke with her. Pt is unsure of the timing of her weakness/fatigue/word finding issues but thinks it was sometime this morning. She denies and significant vision changes and is nearly blind to her left eye at baseline. No weakness to her extremities or numbness, she is moving extremities spontaneously. She had a fall last week, has had multiple falls over the past few months and was taken off doac 2/2 recurrent falls. She reports a brief episode of diplopia that resolved earlier today. She has baseline poor vision to her left eye (nearly blind per family to left eye). Family reports her speech and mental status has varied upon arrival to ED.   Eliquis was stopped 11/15   Past Medical History:  Diagnosis Date   Bladder prolapse, female, acquired    Cancer [facility])    thyroid   Chest pain    no known ischemic heart disease; negative Myoview July 2013. EF  75% with no ischemia.    Chronic anticoagulation    Chronic atrial fibrillation (HCC)    managed with rate control and coumadin   Chronic diastolic heart failure (HCC)    GERD (gastroesophageal reflux disease)    Heart disease    History of congenital mitral regurgitation    mild   History of heart attack    Multiple    History of mitral valve prolapse 06/15/2008   a. echo 1/14: mild LVH, EF 60-65%, mild MR, mild to mod BAE, PASP 35   Hypercholesterolemia    Hypertension    Hypothyroidism     Past Surgical History:  Procedure Laterality Date   ABDOMINAL HYSTERECTOMY     CATARACT EXTRACTION     CHOLECYSTECTOMY     Thyroidectomy     TONSILLECTOMY       The history is provided by the patient and a relative. No language interpreter was used.       Home Medications Prior to Admission medications   Medication Sig Start Date End Date Taking? Authorizing Provider  amLODipine (NORVASC) 2.5 MG tablet TAKE 2 TABLETS BY MOUTH  DAILY Patient not taking: Reported on 07/03/2022 05/08/22   [name], MD  atenolol (TENORMIN) 25 MG tablet Take 1 tablet (25 mg total) by mouth 2 (two) times daily. Patient not taking: Reported on 07/03/2022 03/29/22   Geralynn Rile, MD  Calcium Carbonate-Vitamin D (CALCIUM 600+D PO) Take 1  tablet by mouth in the morning and at bedtime.    [provider]  Cholecalciferol (VITAMIN D) 2000 UNITS tablet Take 2,000 Units by mouth daily.    [provider]  dorzolamide (TRUSOPT) 2 % ophthalmic solution Place 1 drop into the left eye 2 (two) times daily. 09/24/19   [provider]  furosemide (LASIX) 40 MG tablet Take 1 tablet (40 mg total) by mouth every morning AND 0.5 tablets (20 mg total) every evening. 04/18/22   Cleaver, Jossie Ng, NP  latanoprost (XALATAN) 0.005 % ophthalmic solution Place 1 drop into both eyes at bedtime.  02/23/13   [provider]  levothyroxine (SYNTHROID) 75 MCG tablet Take 1 tablet (75 mcg  total) by mouth daily. 07/03/22   Fargo, Amy E, NP  MAGNESIUM OXIDE 400 PO Take 800 mg by mouth daily.    [provider]  Multiple Vitamins-Minerals (CENTRUM SILVER PO) Take 1 tablet by mouth daily.    [provider]  Multiple Vitamins-Minerals (PRESERVISION AREDS 2 PO) Take 1 capsule by mouth 2 (two) times daily.     [provider]  nitroGLYCERIN (NITROSTAT) 0.4 MG SL tablet DISSOLVE 1 TABLET UNDER THE TONGUE EVERY 5 MINUTES FOR 3 DOSES AS NEEDED  FOR  CHEST  PAIN 12/23/18   [name], MD  omeprazole (PRILOSEC) 20 MG capsule Take 20 mg by mouth daily as needed.    [provider]  potassium chloride (KLOR-CON M) 10 MEQ tablet Take 1 tablet (10 mEq total) by mouth daily. 05/08/22   [name], MD  pramipexole (MIRAPEX) 0.125 MG tablet TAKE 1 TABLET BY MOUTH AT  BEDTIME 05/13/22   Fargo, Amy E, NP  PREMARIN vaginal cream Place 2 g vaginally 2 (two) times a week. Uses on Sunday and wednesday 03/25/18   [provider]  Probiotic Product (PROBIOTIC DAILY PO) Take 1 tablet by mouth daily.    [provider]  sodium chloride (MURO 128) 5 % ophthalmic solution Place 1 drop into the right eye at bedtime.     [provider]  temazepam (RESTORIL) 15 MG capsule TAKE 1 CAPSULE(15 MG) BY MOUTH AT BEDTIME 03/15/22   Fargo, Amy E, NP      Allergies    Azithromycin, Compazine [prochlorperazine edisylate], Demerol, Dolophine [methadone], Ebastine, Erythromycin, Ibuprofen, Meperidine hcl, Morphine sulfate, Statins, Tetanus toxoids, Tetracycline, and Tetracyclines & related    Review of Systems   Review of Systems  Constitutional:  Positive for fatigue. Negative for activity change and fever.  HENT:  Negative for facial swelling and trouble swallowing.   Eyes:  Positive for visual disturbance. Negative for discharge and redness.  Respiratory:  Negative for cough and shortness of breath.   Cardiovascular:  Positive for palpitations.  Negative for chest pain.  Gastrointestinal:  Negative for abdominal pain and nausea.  Genitourinary:  Negative for dysuria and flank pain.  Musculoskeletal:  Positive for arthralgias. Negative for back pain and gait problem.  Skin:  Negative for pallor and rash.  Neurological:  Positive for speech difficulty and light-headedness. Negative for syncope and headaches.    Physical Exam Updated Vital Signs BP (!) 147/115   Pulse 77   Temp 97.8 F (36.6 C) (Oral)   Resp 15   Ht '4\' 11"'$  (1.499 m)   Wt 56.7 kg   SpO2 94%   BMI 25.25 kg/m  Physical Exam Vitals and nursing note reviewed.  Constitutional:      General: She is not in acute distress.  Appearance: She is not ill-appearing.     Comments: frail  HENT:     Head: Normocephalic and atraumatic.     Right Ear: External ear normal.     Left Ear: External ear normal.     Nose: Nose normal.     Mouth/Throat:     Mouth: Mucous membranes are moist.  Eyes:     General: No scleral icterus.       Right eye: No discharge.        Left eye: No discharge.     Extraocular Movements: Extraocular movements intact.     Pupils: Pupils are equal, round, and reactive to light.     Comments: Limited vision left  Cardiovascular:     Rate and Rhythm: Tachycardia present. Rhythm irregular.     Pulses: Normal pulses.     Heart sounds: Normal heart sounds.  Pulmonary:     Effort: Pulmonary effort is normal. No respiratory distress.     Breath sounds: Normal breath sounds. No wheezing.  Abdominal:     General: Abdomen is flat.     Tenderness: There is no abdominal tenderness. There is no guarding or rebound.  Musculoskeletal:        General: Normal range of motion.     Cervical back: Full passive range of motion without pain and normal range of motion.     Right lower leg: No edema.     Left lower leg: No edema.  Skin:    General: Skin is warm and dry.     Capillary Refill: Capillary refill takes less than 2 seconds.  Neurological:      Mental Status: She is alert and oriented to person, place, and time.     GCS: GCS eye subscore is 4. GCS verbal subscore is 5. GCS motor subscore is 6.     Cranial Nerves: Cranial nerves 2-12 are intact. No facial asymmetry.     Sensory: Sensation is intact. No sensory deficit.     Motor: Motor function is intact. No tremor.     Coordination: Coordination is intact. Finger-Nose-Finger Test normal.     Comments: Sleepy intermittently Gait not tested 2/2 pt safety  Reduced strength RUE, pt reports her IV hurts and it makes it hard to pull her arm 2/2 pain  Psychiatric:        Mood and Affect: Mood normal.        Behavior: Behavior normal.     ED Results / Procedures / Treatments   Labs (all labs ordered are listed, but only abnormal results are displayed) Labs Reviewed  BRAIN NATRIURETIC PEPTIDE - Abnormal; Notable for the following components:      Result Value   B Natriuretic Peptide 359.8 (*)    All other components within normal limits  TSH - Abnormal; Notable for the following components:   TSH 19.195 (*)    All other components within normal limits  URINALYSIS, ROUTINE W REFLEX MICROSCOPIC - Abnormal; Notable for the following components:   APPearance HAZY (*)    All other components within normal limits  BASIC METABOLIC PANEL - Abnormal; Notable for the following components:   Glucose, Bld 125 (*)    Calcium 7.6 (*)    All other components within normal limits  RESP PANEL BY RT-PCR (FLU A&B, COVID) ARPGX2  CBC WITH DIFFERENTIAL/PLATELET  T4, FREE  MAGNESIUM  TROPONIN I (HIGH SENSITIVITY)  TROPONIN I (HIGH SENSITIVITY)    EKG EKG Interpretation  Date/Time:  Monday July 08 2022 14:50:32 EST Ventricular Rate:  139 PR Interval:    QRS Duration: 116 QT Interval:  328 QTC Calculation: 499 R Axis:   47 Text Interpretation: Atrial fibrillation Nonspecific intraventricular conduction delay Anterior infarct, old Since last tracing rate faster Otherwise no significant  change Confirmed by Deno Etienne [phone]) on 07/08/2022 2:52:48 PM  Radiology CT ANGIO HEAD NECK W WO CM  Result Date: 07/08/2022 CLINICAL DATA:  Diplopia; atrial fibrillation EXAM: CT ANGIOGRAPHY HEAD AND NECK TECHNIQUE: Multidetector CT imaging of the head and neck was performed using the standard protocol during bolus administration of intravenous contrast. Multiplanar CT image reconstructions and MIPs were obtained to evaluate the vascular anatomy. Carotid stenosis measurements (when applicable) are obtained utilizing NASCET criteria, using the distal internal carotid diameter as the denominator. RADIATION DOSE REDUCTION: This exam was performed according to the departmental dose-optimization program which includes automated exposure control, adjustment of the mA and/or kV according to patient size and/or use of iterative reconstruction technique. CONTRAST:  30m OMNIPAQUE IOHEXOL 350 MG/ML SOLN COMPARISON:  06/11/2017 CT head, CTA head neck 12/30/2008 FINDINGS: CT HEAD FINDINGS Brain: No evidence of acute infarct, hemorrhage, mass, mass effect, or midline shift. No hydrocephalus or extra-axial fluid collection. Periventricular white matter changes, likely the sequela of chronic small vessel ischemic disease. Vascular: No hyperdense vessel. Skull: Normal. Negative for fracture or focal lesion. Sinuses/Orbits: No acute finding. Status post bilateral lens replacements. Other: The mastoid air cells are well aerated. CTA NECK FINDINGS Aortic arch: Standard branching. Imaged portion shows no evidence of aneurysm or dissection. 45-50% stenosis in the proximal left subclavian artery (series 13, image 292). Aortic atherosclerosis. Right carotid system: No evidence of dissection, occlusion, or hemodynamically significant stenosis (greater than 50%). Atherosclerotic disease at the bifurcation and in the proximal ICA is not hemodynamically significant. Left carotid system: No evidence of dissection, occlusion, or  hemodynamically significant stenosis (greater than 50%). Atherosclerotic disease at the bifurcation and in the proximal ICA is not hemodynamically significant. Vertebral arteries: No evidence of dissection, occlusion, or hemodynamically significant stenosis (greater than 50%). Skeleton: No acute osseous abnormality. Degenerative changes in the cervical spine. Other neck: No acute finding. Upper chest: Mosaic attenuation, consistent with air trapping. Calcified lesion near the right apex, likely sequela of prior granulomatous disease. No other focal pulmonary opacity or pleural effusion. Review of the MIP images confirms the above findings CTA HEAD FINDINGS Anterior circulation: Both internal carotid arteries are patent to the termini, with mild calcifications but without significant stenosis. A1 segments patent. Normal anterior communicating artery. Anterior cerebral arteries are patent to their distal aspects. No M1 stenosis or occlusion. MCA branches perfused and symmetric. Posterior circulation: Vertebral arteries patent to the vertebrobasilar junction without stenosis. Posterior inferior cerebellar arteries patent proximally. The basilar artery is occluded distally (series 14, image 94), likely thrombosed. The right superior cerebellar artery is not opacified. The bilateral PCAs and left superior cerebellar artery are opacified, secondary to a patent left posterior communicating artery. Patent P1 segments. PCAs perfused to their distal aspects without stenosis. The right posterior communicating artery is not visualized. Venous sinuses: As permitted by contrast timing, patent. Anatomic variants: None significant. Review of the MIP images confirms the above findings IMPRESSION: 1. 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 The right superior cerebellar artery is not opacified. The bilateral PCAs and left superior cerebellar artery are opacified, secondary to a patent left posterior  communicating artery. 2. 45-50% stenosis in the proximal left subclavian artery. No additional hemodynamically  significant stenosis in the neck. 3. No additional acute intracranial process. 4. Aortic atherosclerosis. Aortic Atherosclerosis (ICD10-I70.0). These results were called by telephone at the time of interpretation on 07/08/2022 at 8:10 pm to provider Wynona Dove , who verbally acknowledged these results. Electronically Signed   By: Merilyn Baba M.D.   On: 07/08/2022 20:10   DG Chest Port 1 View  Result Date: 07/08/2022 CLINICAL DATA:  AFib EXAM: PORTABLE CHEST 1 VIEW COMPARISON:  Chest x-ray dated February 03, 2020 FINDINGS: Cardiac and mediastinal contours are unchanged. New nodular opacity of the right lower lung. Lungs otherwise clear. No evidence of pleural effusion or pneumothorax. IMPRESSION: New indeterminate nodular opacity of the right lower lung. Recommend chest CT for further evaluation, which can be performed non emergently. Electronically Signed   By: Yetta Glassman M.D.   On: 07/08/2022 15:16    Procedures Procedures  {Document cardiac monitor, telemetry assessment procedure when appropriate:1}  Medications Ordered in ED Medications  sodium chloride 0.9 % bolus 1,000 mL (0 mLs Intravenous Stopped 07/08/22 1752)  iohexol (OMNIPAQUE) 350 MG/ML injection 75 mL (75 mLs Intravenous Contrast Given 07/08/22 1942)    ED Course/ Medical Decision Making/ A&P Clinical Course as of 07/08/22 2017  Mon Jul 08, 2022  2014 IMPRESSION: 1. 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 The right superior cerebellar artery is not opacified. The bilateral PCAs and left superior cerebellar artery are opacified, secondary to a patent left posterior communicating artery. 2. 45-50% stenosis in the proximal left subclavian artery. No additional hemodynamically significant stenosis in the neck. 3. No additional acute intracranial process. 4. Aortic atherosclerosis.    [SG]    Clinical Course User Index [SG] Jeanell Sparrow, DO                           Medical Decision Making Amount and/or Complexity of Data Reviewed Labs: ordered. Radiology: ordered.  Risk Prescription drug management.   This patient presents to the ED with chief complaint(s) of ams, light headed, palpitations, speech difficulty, sleepy with pertinent past medical history of afib no longer on doac, above which further complicates the presenting complaint. The complaint involves an extensive differential diagnosis and also carries with it a high risk of complications and morbidity.    Differential diagnoses for altered mental status includes but is not exclusive to alcohol, illicit or prescription medications, intracranial pathology such as stroke, intracerebral hemorrhage, fever or infectious causes including sepsis, hypoxemia, uremia, trauma, endocrine related disorders such as diabetes, hypoglycemia, thyroid-related diseases, symptomatic afib, endocrine abnormality, cva, tia, etc . Serious etiologies were considered.   The initial plan is to screening labs/imaging Pt is sleepy on exam but she does not have any focal deficits on exam, she is able to name common objects in the room without error. She is outside of TNK window. She has abnormal vision at baseline, nearly blind left eye; pt feels her right eye vision is at her normal level currently, does report a brief episode of diplopia while EMS was evaluating her that resolved spontaneously. LVO seems less likely given her variable symptoms; will get CTA head/neck.  Pt arrived with paroxysmal afib in rvr, she is nauseated and light headed during these runs of RVR; symptoms earlier could be secondary to symptomatic afib.    Additional history obtained: Additional history obtained from family Records reviewed Primary Care Documents and home meds, prior labs/imaging   Independent labs interpretation:  The  following labs were  independently interpreted:  BMP reviewed, calcium is low  TSH is elevated at 19, prev was 17, per recent cardiology note she is scheduled to see pcp for titration of synthroid; her Free t4 is wnl UA wnl BNP elev at 359, she does have leg edema b/l Trop initially is 14, delta >>   Independent visualization of imaging: - I independently visualized the following imaging with scope of interpretation limited to determining acute life threatening conditions related to emergency care: CXR, CTA head/neck, which revealed CXR with indeterminate nodular opacity. No effusion. CTA head neck with concern for basilar artery occlusion, ?thrombosed. R superior cerebellar artery is not opacified. Left posterior communicating artery is patent- bilateral PCA and left cerebellar artery are opacified via left post com art.   Cardiac monitoring was reviewed and interpreted by myself which shows afib rvr intermittely.   Treatment and Reassessment: IVF   Consultation: - Consulted or discussed management/test interpretation w/ external professional: ***  Consideration for admission or further workup: Admission was considered ***  Social Determinants of health: Social History   Tobacco Use   Smoking status: Never   Smokeless tobacco: Never  Vaping Use   Vaping Use: Never used  Substance Use Topics   Alcohol use: No   Drug use: No      {Document critical care time when appropriate:1} {Document review of labs and clinical decision tools ie heart score, Chads2Vasc2 etc:1}  {Document your independent review of radiology images, and any outside records:1} {Document your discussion with family members, caretakers, and with consultants:1} {Document social determinants of health affecting pt's care:1} {Document your decision making why or why not admission, treatments were needed:1} Final Clinical Impression(s) / ED Diagnoses Final diagnoses:  None    Rx / DC Orders ED Discharge Orders     None

## 2022-07-08 NOTE — ED Notes (Signed)
Patient transported to MRI 

## 2022-07-08 NOTE — ED Notes (Signed)
Per neuro MD, pt can have sips with meds but wants to keep pt NPO d/t pt possibly needing surgery.

## 2022-07-08 NOTE — Code Documentation (Signed)
Stroke Response Nurse Documentation Code Documentation  DARLENE BROZOWSKI is a 88 y.o. female arriving to [facility]  via [facility] on 11/20 with past medical hx of afib, HTN, CAD, . On No antithrombotic. Code stroke was activated by ED.   Patient was brought to the ED earlier today for dizzyness and Afib with RVR. Code stroke was initiated after pt was seen and sent to CT/CTA. Patient from home where she was LKW at 1130 and now complaining of dizzyness, nausea, blurred vision and word salad.   Stroke team at the bedside on patient arrival. Labs drawn and patient cleared for CT by Dr. Pearline Cables. Patient to CT with team. NIHSS 0, see documentation for details and code stroke times. The following imaging was completed:  CT Head and CTA. Patient is not a candidate for IV Thrombolytic due to outside of the window. Patient is not a candidate for IR due to nondisabling, mild symptoms. If symptoms worsen, pt was agreeable to Thrombectomy.     Bedside handoff with ED RN Bryson Ha.    Madelynn Done  Rapid Response RN

## 2022-07-08 NOTE — ED Triage Notes (Signed)
Pt BIB EMS from home. Pt reports multiple medications changes recently. Pt was found to be in A-Fib RVR with EMS. Pt has had multiple falls recently. Pt was standing in kitchen and became dizzy. She seated herself and pressed her medical alert. Pt still endorses dizziness, no chest pain, no shortness of breath. Patient endorses feeling nauseous.   EMS Vitals HR 130-160s 173/79

## 2022-07-09 ENCOUNTER — Inpatient Hospital Stay (HOSPITAL_COMMUNITY): Payer: Medicare Other

## 2022-07-09 DIAGNOSIS — I6389 Other cerebral infarction: Secondary | ICD-10-CM

## 2022-07-09 DIAGNOSIS — I651 Occlusion and stenosis of basilar artery: Secondary | ICD-10-CM | POA: Diagnosis not present

## 2022-07-09 LAB — LIPID PANEL
Cholesterol: 207 mg/dL — ABNORMAL HIGH (ref 0–200)
HDL: 82 mg/dL (ref >40)
LDL Cholesterol: 108 mg/dL — ABNORMAL HIGH (ref 0–99)
Total CHOL/HDL Ratio: 2.5 RATIO
Triglycerides: 84 mg/dL (ref <150)
VLDL: 17 mg/dL (ref 0–40)

## 2022-07-09 LAB — HEPARIN LEVEL (UNFRACTIONATED): Heparin Unfractionated: 0.49 IU/mL (ref 0.30–0.70)

## 2022-07-09 LAB — HEMOGLOBIN A1C
Hgb A1c MFr Bld: 5.8 % — ABNORMAL HIGH (ref 4.8–5.6)
Mean Plasma Glucose: 119.76 mg/dL

## 2022-07-09 LAB — ECHOCARDIOGRAM COMPLETE
Area-P 1/2: 5.23 cm2
Height: 59 in
S' Lateral: 2.5 cm
Weight: 2010.6 oz

## 2022-07-09 LAB — MRSA NEXT GEN BY PCR, NASAL: MRSA by PCR Next Gen: NOT DETECTED

## 2022-07-09 MED ORDER — CLEVIDIPINE BUTYRATE 0.5 MG/ML IV EMUL
INTRAVENOUS | Status: AC
  Administered 2022-07-09: 2 mg/h via INTRAVENOUS
  Filled 2022-07-09: qty 100

## 2022-07-09 MED ORDER — EZETIMIBE 10 MG PO TABS
10.0000 mg | ORAL_TABLET | Freq: Every day | ORAL | Status: DC
  Administered 2022-07-10 – 2022-07-16 (×7): 10 mg via ORAL
  Filled 2022-07-09 (×7): qty 1

## 2022-07-09 MED ORDER — CHLORHEXIDINE GLUCONATE CLOTH 2 % EX PADS
6.0000 | MEDICATED_PAD | Freq: Every day | CUTANEOUS | Status: DC
  Administered 2022-07-09 – 2022-07-10 (×3): 6 via TOPICAL

## 2022-07-09 MED ORDER — ORAL CARE MOUTH RINSE: 15.0000 mL | OROMUCOSAL | Status: DC | PRN

## 2022-07-09 MED ORDER — ONDANSETRON HCL 4 MG/2ML IJ SOLN
4.0000 mg | Freq: Four times a day (QID) | INTRAMUSCULAR | Status: DC | PRN
  Administered 2022-07-10 (×2): 4 mg via INTRAVENOUS
  Filled 2022-07-09 (×4): qty 2

## 2022-07-09 MED ORDER — CLEVIDIPINE BUTYRATE 0.5 MG/ML IV EMUL: 0.0000 mg/h | INTRAVENOUS | Status: DC

## 2022-07-09 MED ORDER — HEPARIN (PORCINE) 25000 UT/250ML-% IV SOLN
550.0000 [IU]/h | INTRAVENOUS | Status: AC
  Administered 2022-07-09: 650 [IU]/h via INTRAVENOUS
  Administered 2022-07-11 – 2022-07-13 (×2): 550 [IU]/h via INTRAVENOUS
  Filled 2022-07-09 (×3): qty 250

## 2022-07-09 MED ORDER — ONDANSETRON HCL 4 MG/2ML IJ SOLN
4.0000 mg | Freq: Four times a day (QID) | INTRAMUSCULAR | Status: DC
  Administered 2022-07-09: 4 mg via INTRAVENOUS
  Filled 2022-07-09: qty 2

## 2022-07-09 NOTE — Progress Notes (Addendum)
STROKE TEAM PROGRESS NOTE   INTERVAL HISTORY Patient is seen in her room with no family at the bedside.  Yesterday, she was admitted with dizziness, nausea and generalized weakness.  She was found to have a thrombus at the tip of the basilar artery, but due to lack of symptoms thrombectomy was deferred and patient was placed on heparin drip with careful observation in the ICU.  Thrombectomy to be performed if patient becomes symptomatic.  Vitals:   07/09/22 1000 07/09/22 1100 07/09/22 1200 07/09/22 1300  BP: 138/79 (!) 168/83  (!) 152/76  Pulse: 76 (!) 111  72  Resp: (!) '21 19  20  '$ Temp:   98.1 F (36.7 C)   TempSrc:   Oral   SpO2: (!) 87% 92%  95%  Weight:      Height:       CBC:  Recent Labs  Lab 07/08/22 1448  WBC 7.3  NEUTROABS 4.5  HGB 15.0  HCT 45.3  MCV 97.2  PLT 109   Basic Metabolic Panel:  Recent Labs  Lab 07/08/22 1738 07/08/22 2225  NA 139  --   K 3.5  --   CL 106  --   CO2 27  --   GLUCOSE 125*  --   BUN 9  --   CREATININE 0.65 0.68  CALCIUM 7.6*  --   MG 1.8  --    Lipid Panel:  Recent Labs  Lab 07/09/22 0332  CHOL 207*  TRIG 84  HDL 82  CHOLHDL 2.5  VLDL 17  LDLCALC 108*   HgbA1c:  Recent Labs  Lab 07/09/22 0332  HGBA1C 5.8*   Urine Drug Screen: No results for input(s): "LABOPIA", "COCAINSCRNUR", "LABBENZ", "AMPHETMU", "THCU", "LABBARB" in the last 168 hours.  Alcohol Level No results for input(s): "ETH" in the last 168 hours.  IMAGING past 24 hours ECHOCARDIOGRAM COMPLETE  Result Date: 07/09/2022    ECHOCARDIOGRAM REPORT   Patient Name:   Kristen Whitehead Date of Exam: 07/09/2022 Medical Rec #:  [id number]           Height:       59.0 in Accession #:    [id number]          Weight:       125.7 lb Date of Birth:  [birth year]           BSA:          1.514 m Patient Age:    88 years            BP:           139/86 mmHg Patient Gender: F                   HR:           59 bpm. Exam Location:  Inpatient Procedure: 2D Echo, Color Doppler  and Cardiac Doppler Indications:    Stroke i63.9  History:        Patient has prior history of Echocardiogram examinations, most                 recent 12/11/2019. CHF, Arrythmias:Atrial Fibrillation; Risk                 Factors:Hypertension and Dyslipidemia.  Sonographer:    Raquel Sarna Senior RDCS Referring Phys: [id number] [place]  1. Left ventricular ejection fraction, by estimation, is 60 to 65%. The left ventricle has normal function. The left ventricle has no regional wall motion  abnormalities. Left ventricular diastolic parameters are indeterminate.  2. Right ventricular systolic function is normal. The right ventricular size is normal. There is normal pulmonary artery systolic pressure. The estimated right ventricular systolic pressure is 38.2 mmHg.  3. Left atrial size was moderately dilated.  4. Right atrial size was mildly dilated.  5. There is no evidence of cardiac tamponade.  6. 2D MVA 3.68 cm2.. The mitral valve is abnormal. Trivial mitral valve regurgitation. Severe mitral annular calcification.  7. The aortic valve was not well visualized. Aortic valve regurgitation is trivial. FINDINGS  Left Ventricle: Left ventricular ejection fraction, by estimation, is 60 to 65%. The left ventricle has normal function. The left ventricle has no regional wall motion abnormalities. The left ventricular internal cavity size was small. There is no left ventricular hypertrophy. Left ventricular diastolic parameters are indeterminate. Right Ventricle: The right ventricular size is normal. No increase in right ventricular wall thickness. Right ventricular systolic function is normal. There is normal pulmonary artery systolic pressure. The tricuspid regurgitant velocity is 2.86 m/s, and  with an assumed right atrial pressure of 3 mmHg, the estimated right ventricular systolic pressure is 50.5 mmHg. Left Atrium: Left atrial size was moderately dilated. Right Atrium: Right atrial size was mildly dilated.  Pericardium: Trivial pericardial effusion is present. The pericardial effusion is circumferential. There is no evidence of cardiac tamponade. Mitral Valve: 2D MVA 3.68 cm2. The mitral valve is abnormal. Severe mitral annular calcification. Trivial mitral valve regurgitation. Tricuspid Valve: The tricuspid valve is normal in structure. Tricuspid valve regurgitation is mild . No evidence of tricuspid stenosis. Aortic Valve: The aortic valve was not well visualized. Aortic valve regurgitation is trivial. Pulmonic Valve: The pulmonic valve was grossly normal. Pulmonic valve regurgitation is trivial. Aorta: The aortic root is normal in size and structure. IAS/Shunts: No atrial level shunt detected by color flow Doppler.  LEFT VENTRICLE PLAX 2D LVIDd:         3.40 cm   Diastology LVIDs:         2.50 cm   LV e' medial:    6.31 cm/s LV PW:         0.90 cm   LV E/e' medial:  16.6 LV IVS:        1.00 cm   LV e' lateral:   9.46 cm/s LVOT diam:     2.00 cm   LV E/e' lateral: 11.1 LV SV:         67 LV SV Index:   44 LVOT Area:     3.14 cm  RIGHT VENTRICLE RV S prime:     10.20 cm/s TAPSE (M-mode): 1.2 cm LEFT ATRIUM             Index        RIGHT ATRIUM           Index LA diam:        3.60 cm 2.38 cm/m   RA Area:     20.20 cm LA Vol (A2C):   64.5 ml 42.61 ml/m  RA Volume:   51.20 ml  33.82 ml/m LA Vol (A4C):   61.1 ml 40.36 ml/m LA Biplane Vol: 63.9 ml 42.21 ml/m  AORTIC VALVE LVOT Vmax:   93.30 cm/s LVOT Vmean:  66.700 cm/s LVOT VTI:    0.213 m  AORTA Ao Root diam: 2.90 cm MITRAL VALVE                TRICUSPID VALVE MV Area (PHT): 5.23 cm  TR Peak grad:   32.7 mmHg MV Decel Time: 145 msec     TR Vmax:        286.00 cm/s MV E velocity: 105.00 cm/s MV A velocity: 31.60 cm/s   SHUNTS MV E/A ratio:  3.32         Systemic VTI:  0.21 m                             Systemic Diam: 2.00 cm Rudean Haskell MD Electronically signed by Rudean Haskell MD Signature Date/Time: 07/09/2022/9:46:21 AM    Final    MR BRAIN  WO CONTRAST  Result Date: 07/08/2022 CLINICAL DATA:  Stroke suspected EXAM: MRI HEAD WITHOUT CONTRAST TECHNIQUE: Multiplanar, multiecho pulse sequences of the brain and surrounding structures were obtained without intravenous contrast. COMPARISON:  No prior MRI, correlation is made with CTA head neck 07/08/2022 FINDINGS: Brain: Restricted diffusion with ADC correlate in the left midbrain (series 5, image [id number]), with additional small focus of restricted diffusion with ADC correlate in the right medial pons (series 5, image 71), consistent with acute infarcts. These are not associated with significant T2 hyperintense signal at this time. No acute hemorrhage, mass, mass effect, or midline shift. No hydrocephalus or extra-axial collection. T2 hyperintense signal in the periventricular white matter, likely the sequela of mild-to-moderate chronic small vessel ischemic disease. Remote lacunar infarcts in the right frontal and left parietal lobes. Cerebral volume is better than expected for age. No hemosiderin deposition to suggest remote hemorrhage. Vascular: Loss of the distal basilar artery flow void (series 11, image 11), which is consistent with the occlusion seen on the same-day CTA. Otherwise normal arterial flow voids. Skull and upper cervical spine: Normal marrow signal. Sinuses/Orbits: Clear paranasal sinuses. Other: Fluid in left mastoid air cells. IMPRESSION: 1. Small acute infarcts in the left midbrain and right medial pons. 2. Loss of the distal basilar artery flow void, consistent with the occlusion seen on the same-day CTA. Imaging results were communicated on 07/08/2022 at 11:45 pm to provider Dr. Lorrin Goodell via secure text paging. Electronically Signed   By: Merilyn Baba M.D.   On: 07/08/2022 23:45   CT ANGIO HEAD NECK W WO CM  Result Date: 07/08/2022 CLINICAL DATA:  Diplopia; atrial fibrillation EXAM: CT ANGIOGRAPHY HEAD AND NECK TECHNIQUE: Multidetector CT imaging of the head and neck was  performed using the standard protocol during bolus administration of intravenous contrast. Multiplanar CT image reconstructions and MIPs were obtained to evaluate the vascular anatomy. Carotid stenosis measurements (when applicable) are obtained utilizing NASCET criteria, using the distal internal carotid diameter as the denominator. RADIATION DOSE REDUCTION: This exam was performed according to the departmental dose-optimization program which includes automated exposure control, adjustment of the mA and/or kV according to patient size and/or use of iterative reconstruction technique. CONTRAST:  61m OMNIPAQUE IOHEXOL 350 MG/ML SOLN COMPARISON:  06/11/2017 CT head, CTA head neck 12/30/2008 FINDINGS: CT HEAD FINDINGS Brain: No evidence of acute infarct, hemorrhage, mass, mass effect, or midline shift. No hydrocephalus or extra-axial fluid collection. Periventricular white matter changes, likely the sequela of chronic small vessel ischemic disease. Vascular: No hyperdense vessel. Skull: Normal. Negative for fracture or focal lesion. Sinuses/Orbits: No acute finding. Status post bilateral lens replacements. Other: The mastoid air cells are well aerated. CTA NECK FINDINGS Aortic arch: Standard branching. Imaged portion shows no evidence of aneurysm or dissection. 45-50% stenosis in the proximal left subclavian artery (series 13, image 292). Aortic  atherosclerosis. Right carotid system: No evidence of dissection, occlusion, or hemodynamically significant stenosis (greater than 50%). Atherosclerotic disease at the bifurcation and in the proximal ICA is not hemodynamically significant. Left carotid system: No evidence of dissection, occlusion, or hemodynamically significant stenosis (greater than 50%). Atherosclerotic disease at the bifurcation and in the proximal ICA is not hemodynamically significant. Vertebral arteries: No evidence of dissection, occlusion, or hemodynamically significant stenosis (greater than 50%).  Skeleton: No acute osseous abnormality. Degenerative changes in the cervical spine. Other neck: No acute finding. Upper chest: Mosaic attenuation, consistent with air trapping. Calcified lesion near the right apex, likely sequela of prior granulomatous disease. No other focal pulmonary opacity or pleural effusion. Review of the MIP images confirms the above findings CTA HEAD FINDINGS Anterior circulation: Both internal carotid arteries are patent to the termini, with mild calcifications but without significant stenosis. A1 segments patent. Normal anterior communicating artery. Anterior cerebral arteries are patent to their distal aspects. No M1 stenosis or occlusion. MCA branches perfused and symmetric. Posterior circulation: Vertebral arteries patent to the vertebrobasilar junction without stenosis. Posterior inferior cerebellar arteries patent proximally. The basilar artery is occluded distally (series 14, image 94), likely thrombosed. The right superior cerebellar artery is not opacified. The bilateral PCAs and left superior cerebellar artery are opacified, secondary to a patent left posterior communicating artery. Patent P1 segments. PCAs perfused to their distal aspects without stenosis. The right posterior communicating artery is not visualized. Venous sinuses: As permitted by contrast timing, patent. Anatomic variants: None significant. Review of the MIP images confirms the above findings IMPRESSION: 1. 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 The right superior cerebellar artery is not opacified. The bilateral PCAs and left superior cerebellar artery are opacified, secondary to a patent left posterior communicating artery. 2. 45-50% stenosis in the proximal left subclavian artery. No additional hemodynamically significant stenosis in the neck. 3. No additional acute intracranial process. 4. Aortic atherosclerosis. Aortic Atherosclerosis (ICD10-I70.0). These results were called by  telephone at the time of interpretation on 07/08/2022 at 8:10 pm to provider Wynona Dove , who verbally acknowledged these results. Electronically Signed   By: Merilyn Baba M.D.   On: 07/08/2022 20:10   DG Chest Port 1 View  Result Date: 07/08/2022 CLINICAL DATA:  AFib EXAM: PORTABLE CHEST 1 VIEW COMPARISON:  Chest x-ray dated February 03, 2020 FINDINGS: Cardiac and mediastinal contours are unchanged. New nodular opacity of the right lower lung. Lungs otherwise clear. No evidence of pleural effusion or pneumothorax. IMPRESSION: New indeterminate nodular opacity of the right lower lung. Recommend chest CT for further evaluation, which can be performed non emergently. Electronically Signed   By: Yetta Glassman M.D.   On: 07/08/2022 15:16    PHYSICAL EXAM General: Alert, well-developed, well-nourished elderly patient in no acute distress Respiratory: Regular, unlabored respirations on room air  NEURO:  Mental Status: AA&Ox3  Speech/Language: speech is without dysarthria or aphasia.  Naming, fluency, and comprehension intact.  Cranial Nerves:  II: Right pupil 4 mm and sluggish, left pupil 3 mm and briskly reactive III, IV, VI: EOMI.  V: Sensation is intact to light touch and symmetrical to face.  VII: Smile is symmetrical.   VIII: hearing intact to voice. IX, X: Phonation is normal.  XII: tongue is midline without fasciculations. Motor: 5/5 strength to all muscle groups tested.  Tone: is normal and bulk is normal Sensation- Intact to light touch bilaterally.   Coordination: FTN intact bilaterally.  No drift.  Gait- deferred  ASSESSMENT/PLAN Kristen Whitehead is a 88 y.o. female with history of atrial fibrillation not on anticoagulation due to falls, GERD, hyperlipidemia, hypertension, hypothyroidism and macular degeneration of bilateral eyes with left worse than right presenting with dizziness, nausea and generalized weakness.  She was found to have a thrombus at the tip of the  basilar artery, but due to lack of symptoms thrombectomy was deferred and patient was placed on heparin drip with careful observation in the ICU.  Thrombectomy to be performed if patient becomes symptomatic.  Stroke: Small acute infarcts in left midbrain and right medial pons Thrombus of basilar tip Etiology: Likely cardioembolic in setting of atrial fibrillation off anticoagulation CT head No acute abnormality. Small vessel disease.  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 Yes; Fluid consistency: Thin   No antithrombotic prior to admission, now on heparin IV.  Therapy recommendations:  SNF Disposition: Pending  Hypertension Home meds: None Stable Permissive hypertension (OK if < 220/120) but gradually normalize in 5-7 days Long-term BP goal normotensive  Hyperlipidemia Home meds: None LDL 108, goal < 70 Add ezetimibe 10 mg daily High intensity statin not indicated due to previous intolerance Continue statin at discharge  Atrial fibrillation Patient has history of atrial fibrillation, not on anticoagulation due to frequent falls Will start heparin IV as inpatient due to basilar tip thrombus  Other Stroke Risk Factors Advanced Age >/= 16   Other Active Problems None  Hospital day # [place] , MSN, AGACNP-BC [facility] See Amion for schedule and pager information 07/09/2022 2:13 PM   ATTENDING ATTESTATION:  88 year old with overall amazing baseline with acute left midbrain right pontine CVA in The basilar thrombus putting her in a very critical situation for further propagation of thrombus.  Therefore heparin drip low-dose is recommended.  There is risk of bleeding.  I  discussed this with her daughter Mitzie over the phone and explained her complicated situation.  Her exam is largely unremarkable she is alert awake follows commands and does not have focal weakness.  Pupils equal round reactive.  Extraocular motions are intact with no gaze deviation.  She is requesting to eat breakfast this morning.  We will continue every hour neurochecks stat code stroke call if any changes.  Plan discussed with ICU nurse.  Dr. Reeves Forth evaluated pt independently, reviewed imaging, chart, labs. Discussed and formulated plan with the Resident/APP. Changes were made to the note where appropriate. Please see APP/resident note above for details.      This patient is critically ill due to acute stroke with tip of the basilar thrombus and at significant risk of neurological worsening, death form heart failure, respiratory failure, recurrent stroke, bleeding from [facility], seizure, sepsis. This patient's care requires constant monitoring of vital signs, hemodynamics, respiratory and cardiac monitoring, review of multiple databases, neurological assessment, discussion with family, other specialists and medical decision making of high complexity. I spent 35 minutes of neurocritical care time in the care of this patient.   Yelitza Reach,MD   To contact Stroke Continuity provider, please refer to [email or web address] After hours, contact General Neurology

## 2022-07-09 NOTE — Evaluation (Signed)
Occupational Therapy Evaluation Patient Details Name: Kristen Whitehead MRN: [id number] DOB: [birth year] Today's Date: 07/09/2022   History of Present Illness Kristen Whitehead is a 88 y.o. female admitted 07/08/22 with intermittent nausea, generalized weakness, episode of aphasia earlier and diplopia. 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 CT revealed distal basilar thrombus. PMH includes chronic atrial fibrillation who is currently not on anticoagulation due to history of multiple falls, GERD, hypercholesterolemia, hypertension, hypothyroidism, macular degeneration of bilateral eyes with left worse than right who was recently taken off of her Eliquis for multiple falls.   Clinical Impression   Pt typically lives alone, completes her own ADL/IADL, furniture surfs inside the home and utilizes as RW outside the home. She enjoys going to church and having Friday night supper club with her friends everyweek. Today she is mod A for bed mobility, mod A for LB dressing, set up for grooming seated EOB, min A +2 safety for sit<>stand with RW from high ICU bed. Pt incontinent with standing, min A with posterior lean for transfer to [facility] ([facility]), mod A for peri care - able to assist with pulling up mesh undies. Min A +2 safety for pivot to recliner, and able to self-feed with set up. Pt denies double vision, but unable to accurately identify number of fingers unless one eye covered. Slight disconjugate gaze - OT will continue to investigate. Currently recommending SNF post-acute to maximize safety and independence in ADL and functional transfers - she does not currently have family that can be with her 24/7. OT will focus on vision next session and continued OOB participation in ADL.       Recommendations for follow up therapy are one component of a multi-disciplinary discharge planning process, led by the attending physician.  Recommendations may be updated based on patient  status, additional functional criteria and insurance authorization.   Follow Up Recommendations  Skilled nursing-short term rehab (<3 hours/day)     Assistance Recommended at Discharge Frequent or constant Supervision/Assistance  Patient can return home with the following A little help with walking and/or transfers;A lot of help with bathing/dressing/bathroom;Assistance with cooking/housework;Direct supervision/assist for medications management;Direct supervision/assist for financial management;Assist for transportation;Help with stairs or ramp for entrance    Functional Status Assessment  Patient has had a recent decline in their functional status and demonstrates the ability to make significant improvements in function in a reasonable and predictable amount of time.  Equipment Recommendations  BSC/3in1;Other (comment) (defer to next venue of care)    Recommendations for Other Services PT consult;Speech consult (cognition - SLP)     Precautions / Restrictions Precautions Precautions: Fall Precaution Comments: incontenence; MD asked to take positional transfers/transitions slowly per RN Restrictions Weight Bearing Restrictions: No      Mobility Bed Mobility Overal bed mobility: Needs Assistance Bed Mobility: Supine to Sit     Supine to sit: HOB elevated, Mod assist     General bed mobility comments: vc for sequencing, HOB elevated, asisst for trunk elevation and hips EOB    Transfers Overall transfer level: Needs assistance Equipment used: Rolling walker (2 wheels) Transfers: Sit to/from Stand, Bed to chair/wheelchair/BSC Sit to Stand: Min assist, +2 safety/equipment, From elevated surface     Step pivot transfers: Min assist, +2 safety/equipment     General transfer comment: posterior lean, managed with min A, vc for safe hand placement      Balance Overall balance assessment: Needs assistance Sitting-balance support: Bilateral upper extremity supported, Feet  unsupported (high ICU bed) Sitting balance-Leahy Scale: Fair   Postural control: Posterior lean Standing balance support: Bilateral upper extremity supported, Single extremity supported, During functional activity, Reliant on assistive device for balance Standing balance-Leahy Scale: Poor Standing balance comment: posterior lean and frequently relies on support from structures behind legs (bed, BSC) for increased balance in addition to RW                           ADL either performed or assessed with clinical judgement   ADL Overall ADL's : Needs assistance/impaired Eating/Feeding: Set up;Sitting Eating/Feeding Details (indicate cue type and reason): opened up containers, able to visually locate items and self-feed Grooming: Wash/dry face;Set up;Sitting   Upper Body Bathing: Moderate assistance   Lower Body Bathing: Moderate assistance   Upper Body Dressing : Minimal assistance   Lower Body Dressing: Moderate assistance Lower Body Dressing Details (indicate cue type and reason): able to reach down to feet from seated position for first sock, required assist for second. increased time and effort overall Toilet Transfer: Minimal assistance;Moderate assistance;Stand-pivot;Rolling walker (2 wheels) Toilet Transfer Details (indicate cue type and reason): posterior lean, incontinence Toileting- Clothing Manipulation and Hygiene: Moderate assistance;Sit to/from stand Toileting - Clothing Manipulation Details (indicate cue type and reason): OT performed in standing, Pt assisting with pulling up mesh underwear Tub/ Shower Transfer: Moderate assistance   Functional mobility during ADLs: Minimal assistance;Cueing for safety;Rolling walker (2 wheels);+2 for safety/equipment General ADL Comments: decreased strength, balance, high risk of falls, cognition     Vision Baseline Vision/History: 1 Wears glasses Ability to See in Adequate Light: 1 Impaired Patient Visual Report:  Diplopia Vision Assessment?: Yes;Vision impaired- to be further tested in functional context Eye Alignment: Impaired (comment) Alignment/Gaze Preference: Within Defined Limits Diplopia Assessment: Disappears with one eye closed Additional Comments: needs continued examination, initially saying she had no double vision, she WAS able to find items on food tray and feed herself appropriately, but trouble with identifying how many fingers held up with both eyes opened, able to accurately state with one eye covered     Perception     Praxis      Pertinent Vitals/Pain Pain Assessment Pain Assessment: Faces Faces Pain Scale: Hurts little more Pain Location: R shoulder (chronic) Pain Descriptors / Indicators: Discomfort, Constant Pain Intervention(s): Monitored during session, Repositioned     Hand Dominance Right   Extremity/Trunk Assessment Upper Extremity Assessment Upper Extremity Assessment: Generalized weakness   Lower Extremity Assessment Lower Extremity Assessment: Defer to PT evaluation   Cervical / Trunk Assessment Cervical / Trunk Assessment: Kyphotic   Communication Communication Communication: HOH   Cognition Arousal/Alertness: Awake/alert Behavior During Therapy: WFL for tasks assessed/performed Overall Cognitive Status: Impaired/Different from baseline Area of Impairment: Memory, Problem solving, Safety/judgement                     Memory: Decreased short-term memory   Safety/Judgement: Decreased awareness of safety   Problem Solving: Slow processing, Requires verbal cues General Comments: Pt very cooperative and suspect "slow moving" at baseline. She admits that she cannot go home alone. She is able to answer oritentation and home set up questions - but did not remember therapist that had been in the room earlier. No family present during eval to confirm cognition status     General Comments  highest HR was 151 with transfer, SPO2 WFL on RA     Exercises     Shoulder Instructions  Home Living Family/patient expects to be discharged to:: Private residence Living Arrangements: Alone Available Help at Discharge: Family;Available PRN/intermittently Type of Home: Other(Comment) (townhome/condo) Home Access: Stairs to enter Entrance Stairs-Number of Steps: 1 Entrance Stairs-Rails: None Home Layout: One level     Bathroom Shower/Tub: Occupational psychologist: Standard Bathroom Accessibility: Yes   Home Equipment: Conservation officer, nature (2 wheels);Cane - single point;Grab bars - tub/shower          Prior Functioning/Environment Prior Level of Function : Independent/Modified Independent;History of Falls (last six months) (confirmed with family)             Mobility Comments: furniture surfs inside home and uses RW outside the home ADLs Comments: completes her own IADL/ADL        OT Problem List: Decreased strength;Decreased activity tolerance;Impaired balance (sitting and/or standing);Impaired vision/perception;Decreased safety awareness;Decreased knowledge of use of DME or AE      OT Treatment/Interventions: Self-care/ADL training;Energy conservation;DME and/or AE instruction;Therapeutic activities;Patient/family education;Balance training    OT Goals(Current goals can be found in the care plan section) Acute Rehab OT Goals Patient Stated Goal: be safe OT Goal Formulation: With patient Time For Goal Achievement: 07/23/22 Potential to Achieve Goals: Good ADL Goals Pt Will Perform Grooming: with supervision;standing Pt Will Perform Upper Body Dressing: with modified independence;sitting Pt Will Perform Lower Body Dressing: with supervision;sit to/from stand Pt Will Transfer to Toilet: with supervision;ambulating Pt Will Perform Toileting - Clothing Manipulation and hygiene: with supervision;sit to/from stand Additional ADL Goal #1: Pt will verbalize 3 strategies to reduce risk of falls in home environment  with no cues Additional ADL Goal #2: Pt will navigate room environment and find grooming items utilzing compensatory strategies for visual disturbances with no cues at supervision level  OT Frequency: Min 2X/week    Co-evaluation PT/OT/SLP Co-Evaluation/Treatment: Yes Reason for Co-Treatment: Necessary to address cognition/behavior during functional activity;For patient/therapist safety;To address functional/ADL transfers PT goals addressed during session: Balance;Mobility/safety with mobility;Proper use of DME OT goals addressed during session: ADL's and self-care;Proper use of Adaptive equipment and DME;Strengthening/ROM      AM-PAC OT "6 Clicks" Daily Activity     Outcome Measure Help from another person eating meals?: A Little Help from another person taking care of personal grooming?: A Little Help from another person toileting, which includes using toliet, bedpan, or urinal?: A Lot Help from another person bathing (including washing, rinsing, drying)?: A Lot Help from another person to put on and taking off regular upper body clothing?: A Little Help from another person to put on and taking off regular lower body clothing?: A Lot 6 Click Score: 15   End of Session Equipment Utilized During Treatment: Gait belt;Rolling walker (2 wheels) Nurse Communication: Mobility status;Precautions (no purewick)  Activity Tolerance: Patient tolerated treatment well Patient left: in chair;with call bell/phone within reach;with chair alarm set  OT Visit Diagnosis: Unsteadiness on feet (R26.81);Other abnormalities of gait and mobility (R26.89);Repeated falls (R29.6);Muscle weakness (generalized) (M62.81);History of falling (Z91.81);Low vision, both eyes (H54.2);Other symptoms and signs involving the nervous system (R29.898)                Time: 7253-6644 OT Time Calculation (min): 33 min Charges:  OT General Charges $OT Visit: 1 Visit OT Evaluation $OT Eval Moderate Complexity: [place]  OTR/L Acute Rehabilitation Services Office: [place] 07/09/2022, 10:57 AM

## 2022-07-09 NOTE — Progress Notes (Signed)
[place] for heparin Indication:  tip of basilar thrombus, per stroke protocol  Heparin Dosing Weight: 54.8 kg  Labs: Recent Labs    07/08/22 1448 07/08/22 1738 07/08/22 2225 07/09/22 1925  HGB 15.0  --   --   --   HCT 45.3  --   --   --   PLT 230  --   --   --   HEPARINUNFRC  --   --   --  0.49  CREATININE  --  0.65 0.68  --      Assessment: 35 yof with hx afib not on anticoagulation PTA (recently taken off apixaban) due hx of multiple falls presenting with acute basilar artery thrombosis. Pharmacy consulted to start heparin drip per stroke protocol. Patient not on anticoagulation PTA. Currently on Lovenox '30mg'$  [place] q24h for VTE prophylaxis - last dose 11/20 at 2215. CBC WNL. No bleed issues reported.  Heparin level therapeutic at 0.49, will decrease slightly due to being at the top of the therapeutic range.  Goal of Therapy:  Heparin level 0.3-0.5 units/ml Monitor platelets by anticoagulation protocol: Yes   Plan:  No boluses per stroke protocol Decrease heparin infusion to 600 units/hr Check 8hr heparin level Monitor daily CBC, s/sx bleeding   Alanda Slim, PharmD, [facility] Clinical Pharmacist Please see AMION for all Pharmacists' Contact Phone Numbers 07/09/2022, 8:26 PM

## 2022-07-09 NOTE — ED Notes (Signed)
Pharm notified about needing eye drops for patient.

## 2022-07-09 NOTE — Progress Notes (Addendum)
Patient ID: Kristen Whitehead, female   DOB: 04/30/1923, 88 y.o.   MRN: [id number]  Patient acute change in neuro status.  Code stroke team was at bedside.  She was nauseated but did not vomit.  She had decreased mental status but on exam she had good strength and able to answer questions.  NIH stroke scale 1 for dysarthria.  Pupils equal round reactive.  Right pupil larger than the left but this is her baseline due to right eye vision loss.  She did have pain in her back.  She had A-fib with RVR rates in the 140s 50s and hypertensive systolic 016.  Stat head CT was completed and there is no bleed or significant change.  Patient's daughter and granddaughter as well as spouses were present I updated them of her scans and that she is in critical condition.  We will continue every neurochecks. continue heparin drip.  She is on Cleviprex to keep systolic blood pressure less than 160 caution on propping blood pressure too much to avoid hypoperfusion.  If needed she may need as needed labetalol for elevated heart rate however this has stabilized to less than 100.  Consider amiodarone if needed.  Additional 30 minutes of critical care time.

## 2022-07-09 NOTE — Progress Notes (Signed)
Echocardiogram 2D Echocardiogram has been performed.  Oneal Deputy Donni Oglesby RDCS 07/09/2022, 8:54 AM

## 2022-07-09 NOTE — Progress Notes (Signed)
Notified by family that patient HR elevated. Previous NIH completed at 1700 with no change and score of 0. Patient now altered with decreased LOC and slurred speech, HR in 150's to 160's in Afib RVR and BP systolic in 389'H. Patient also dry heaving and complaining of nausea. Dr. Reeves Forth paged with orders for stat head CT and cleviprex IV. New IV placed in left forearm with cleviprex started at 2 ml/hr. Patient taken to CT and back with no incident. Patient NIH now 2. Per Dr. Reeves Forth will continue Q1 hour neuro checks/NIH. Patient resting and iv zofran administered for nausea.

## 2022-07-09 NOTE — ED Notes (Signed)
Unable to perform 2300 modified NIH d/t pt being in MRI.

## 2022-07-09 NOTE — Progress Notes (Signed)
[place] for heparin Indication:  tip of basilar thrombus, per stroke protocol  Heparin Dosing Weight: 54.8 kg  Labs: Recent Labs    07/08/22 1448 07/08/22 1738 07/08/22 2225  HGB 15.0  --   --   HCT 45.3  --   --   PLT 230  --   --   CREATININE  --  0.65 0.68    Assessment: 27 yof with hx afib not on anticoagulation PTA (recently taken off apixaban) due hx of multiple falls presenting with acute basilar artery thrombosis. Pharmacy consulted to start heparin drip per stroke protocol. Patient not on anticoagulation PTA. Currently on Lovenox '30mg'$  [place] q24h for VTE prophylaxis - last dose 11/20 at 2215. CBC WNL. No bleed issues reported.  Goal of Therapy:  Heparin level 0.3-0.5 units/ml Monitor platelets by anticoagulation protocol: Yes   Plan:  No boluses per stroke protocol Start heparin infusion at 650 units/hr Check 8hr heparin level Monitor daily CBC, s/sx bleeding   Arturo Morton, PharmD, BCPS Clinical Pharmacist 07/09/2022 9:49 AM

## 2022-07-09 NOTE — Evaluation (Signed)
Physical Therapy Evaluation Patient Details Name: Kristen Whitehead MRN: [id number] DOB: [birth year] Today's Date: 07/09/2022  History of Present Illness  Kristen Whitehead is a 88 y.o. female admitted 07/08/22 with intermittent nausea, generalized weakness, episode of aphasia earlier and diplopia. 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 CT revealed distal basilar thrombus. PMH includes chronic atrial fibrillation who is currently not on anticoagulation due to history of multiple falls, GERD, hypercholesterolemia, hypertension, hypothyroidism, macular degeneration of bilateral eyes with left worse than right who was recently taken off of her Eliquis for multiple falls.  Clinical Impression  Patient presents with decreased mobility due to generalized weakness, decreased balance, decreased activity tolerance, decreased safety awareness and cognition.  At baseline she lives alone and manages her own ADL/IADL's.  Currently needing mod A for mobility with +2 today for safety and activity tolerance.  She reports unsure if family can provide 24/7 support at d/c.  She may need SNF level rehab at d/c.  PT will continue to follow.        Recommendations for follow up therapy are one component of a multi-disciplinary discharge planning process, led by the attending physician.  Recommendations may be updated based on patient status, additional functional criteria and insurance authorization.  Follow Up Recommendations Skilled nursing-short term rehab (<3 hours/day) Can patient physically be transported by private vehicle: Yes    Assistance Recommended at Discharge Frequent or constant Supervision/Assistance  Patient can return home with the following  A little help with walking and/or transfers;A little help with bathing/dressing/bathroom;Help with stairs or ramp for entrance;Assist for transportation;Assistance with cooking/housework;Direct supervision/assist for  medications management    Equipment Recommendations None recommended by PT  Recommendations for Other Services       Functional Status Assessment Patient has had a recent decline in their functional status and demonstrates the ability to make significant improvements in function in a reasonable and predictable amount of time.     Precautions / Restrictions Precautions Precautions: Fall Precaution Comments: incontenence; MD asked to take positional transfers/transitions slowly per RN Restrictions Weight Bearing Restrictions: No      Mobility  Bed Mobility Overal bed mobility: Needs Assistance Bed Mobility: Supine to Sit     Supine to sit: HOB elevated, Mod assist     General bed mobility comments: vc for sequencing, HOB elevated, asisst for trunk elevation and hips EOB    Transfers Overall transfer level: Needs assistance Equipment used: Rolling walker (2 wheels) Transfers: Sit to/from Stand, Bed to chair/wheelchair/BSC Sit to Stand: Min assist, +2 safety/equipment, From elevated surface   Step pivot transfers: Min assist, +2 safety/equipment       General transfer comment: posterior lean, managed with min A, vc for safe hand placement; RW use for balance though assist for walker management as well    Ambulation/Gait                  Stairs            Wheelchair Mobility    Modified Rankin (Stroke Patients Only) Modified Rankin (Stroke Patients Only) Pre-Morbid Rankin Score: Slight disability Modified Rankin: Moderately severe disability     Balance Overall balance assessment: Needs assistance Sitting-balance support: Bilateral upper extremity supported, Feet unsupported Sitting balance-Leahy Scale: Fair Sitting balance - Comments: reaching in sitting to don sock Postural control: Posterior lean Standing balance support: Bilateral upper extremity supported, Single extremity supported, During functional activity, Reliant on assistive device for  balance Standing  balance-Leahy Scale: Poor Standing balance comment: posterior lean and frequently relies on support from structures behind legs (bed, BSC) for increased balance in addition to RW; assist for hygiene after toileting, pt able to take one hand off walker to help pull up mesh briefs on L side still minA for balance throughout                             Pertinent Vitals/Pain Pain Assessment Faces Pain Scale: Hurts little more Pain Location: R shoulder (chronic) Pain Descriptors / Indicators: Discomfort, Constant Pain Intervention(s): Monitored during session, Repositioned    Home Living Family/patient expects to be discharged to:: Private residence Living Arrangements: Alone Available Help at Discharge: Family;Available PRN/intermittently Type of Home: Other(Comment) (townhome) Home Access: Stairs to enter Entrance Stairs-Rails: None Entrance Stairs-Number of Steps: 1   Home Layout: One level Home Equipment: Conservation officer, nature (2 wheels);Cane - single point;Grab bars - tub/shower      Prior Function Prior Level of Function : Independent/Modified Independent;History of Falls (last six months)             Mobility Comments: furniture surfs inside home and uses RW outside the home ADLs Comments: completes her own IADL/ADL     Hand Dominance   Dominant Hand: Right    Extremity/Trunk Assessment   Upper Extremity Assessment Upper Extremity Assessment: Defer to OT evaluation    Lower Extremity Assessment Lower Extremity Assessment: Generalized weakness    Cervical / Trunk Assessment Cervical / Trunk Assessment: Kyphotic  Communication   Communication: HOH  Cognition Arousal/Alertness: Awake/alert Behavior During Therapy: WFL for tasks assessed/performed Overall Cognitive Status: Impaired/Different from baseline Area of Impairment: Memory, Problem solving, Safety/judgement                     Memory: Decreased short-term memory    Safety/Judgement: Decreased awareness of safety   Problem Solving: Slow processing, Requires verbal cues General Comments: Pt very cooperative and suspect "slow moving" at baseline. She admits that she cannot go home alone. She is able to answer oritentation and home set up questions - but did not remember therapist that had been in the room earlier. No family present during eval to confirm cognition status        General Comments General comments (skin integrity, edema, etc.): HR max 151, SpO2 WNL on RA    Exercises     Assessment/Plan    PT Assessment Patient needs continued PT services  PT Problem List Decreased strength;Decreased cognition;Decreased knowledge of use of DME;Decreased safety awareness;Decreased activity tolerance;Decreased balance;Decreased mobility;Decreased knowledge of precautions       PT Treatment Interventions DME instruction;Balance training;Gait training;Neuromuscular re-education;Cognitive remediation;Functional mobility training;Patient/family education;Therapeutic activities;Therapeutic exercise    PT Goals (Current goals can be found in the Care Plan section)  Acute Rehab PT Goals Patient Stated Goal: to return to independent PT Goal Formulation: With patient/family Time For Goal Achievement: 07/23/22 Potential to Achieve Goals: Fair    Frequency Min 3X/week     Co-evaluation PT/OT/SLP Co-Evaluation/Treatment: Yes Reason for Co-Treatment: Necessary to address cognition/behavior during functional activity;For patient/therapist safety;To address functional/ADL transfers PT goals addressed during session: Mobility/safety with mobility;Balance;Proper use of DME OT goals addressed during session: ADL's and self-care;Proper use of Adaptive equipment and DME;Strengthening/ROM       AM-PAC PT "6 Clicks" Mobility  Outcome Measure Help needed turning from your back to your side while in a flat bed without using bedrails?: A Lot  Help needed moving  from lying on your back to sitting on the side of a flat bed without using bedrails?: A Lot Help needed moving to and from a bed to a chair (including a wheelchair)?: A Lot Help needed standing up from a chair using your arms (e.g., wheelchair or bedside chair)?: A Lot Help needed to walk in hospital room?: Total Help needed climbing 3-5 steps with a railing? : Total 6 Click Score: 10    End of Session Equipment Utilized During Treatment: Gait belt Activity Tolerance: Patient limited by fatigue Patient left: in chair;with call bell/phone within reach;with chair alarm set Nurse Communication: Mobility status PT Visit Diagnosis: Other abnormalities of gait and mobility (R26.89);Other symptoms and signs involving the nervous system (R29.898);Muscle weakness (generalized) (M62.81);History of falling (Z91.81)    Time: 5825-1898 PT Time Calculation (min) (ACUTE ONLY): 33 min   Charges:   PT Evaluation $PT Eval Moderate Complexity: 1 Mod          Cyndi Nainika Newlun, PT Acute Rehabilitation Services [phone] 07/09/2022   Reginia Naas 07/09/2022, 12:15 PM

## 2022-07-09 NOTE — Progress Notes (Signed)
SRN called to room by 4N staff after bedside RN noted significant changes in the pt's assessment from the rest of the day prior. When pt assessed she was harder to arouse and answer questions, she was slurred, and gagging like she was going to vomit. BP was in the 180's sys (abnormal from BP during the rest of the day). Neurologist called and came to the bedside. STAT head CT WO ordered and completed. NIHSS 2 for decreased level of consciousness and dysarthria. Afib with HR in the 140's sustaining. Cleviprex gtt started for BP management. CT results unremarkable per MD.   Care Plan: Q1  neuro checks/vitals, Amio gtt for rate control.   Margarette Asal, RN  [phone]

## 2022-07-10 DIAGNOSIS — I651 Occlusion and stenosis of basilar artery: Secondary | ICD-10-CM | POA: Diagnosis not present

## 2022-07-10 LAB — CBC
HCT: 42.5 % (ref 36.0–46.0)
Hemoglobin: 15 g/dL (ref 12.0–15.0)
MCH: 32.8 pg (ref 26.0–34.0)
MCHC: 35.3 g/dL (ref 30.0–36.0)
MCV: 92.8 fL (ref 80.0–100.0)
Platelets: 198 10*3/uL (ref 150–400)
RBC: 4.58 MIL/uL (ref 3.87–5.11)
RDW: 15.2 % (ref 11.5–15.5)
WBC: 7.2 10*3/uL (ref 4.0–10.5)
nRBC: 0 % (ref 0.0–0.2)

## 2022-07-10 LAB — BASIC METABOLIC PANEL
Anion gap: 14 (ref 5–15)
Anion gap: 13 (ref 5–15)
BUN: 8 mg/dL (ref 8–23)
BUN: 9 mg/dL (ref 8–23)
CO2: 22 mmol/L (ref 22–32)
CO2: 22 mmol/L (ref 22–32)
Calcium: 6.8 mg/dL — ABNORMAL LOW (ref 8.9–10.3)
Calcium: 6.8 mg/dL — ABNORMAL LOW (ref 8.9–10.3)
Chloride: 102 mmol/L (ref 98–111)
Chloride: 103 mmol/L (ref 98–111)
Creatinine, Ser: 0.65 mg/dL (ref 0.44–1.00)
Creatinine, Ser: 0.69 mg/dL (ref 0.44–1.00)
GFR, Estimated: >60 mL/min (ref >60)
GFR, Estimated: >60 mL/min (ref >60)
Glucose, Bld: 111 mg/dL — ABNORMAL HIGH (ref 70–99)
Glucose, Bld: 92 mg/dL (ref 70–99)
Potassium: 2.6 mmol/L — CL (ref 3.5–5.1)
Potassium: 2.9 mmol/L — ABNORMAL LOW (ref 3.5–5.1)
Sodium: 138 mmol/L (ref 135–145)
Sodium: 138 mmol/L (ref 135–145)

## 2022-07-10 LAB — POTASSIUM: Potassium: 2.6 mmol/L — CL (ref 3.5–5.1)

## 2022-07-10 LAB — HEPARIN LEVEL (UNFRACTIONATED)
Heparin Unfractionated: 0.67 IU/mL (ref 0.30–0.70)
Heparin Unfractionated: 0.42 IU/mL (ref 0.30–0.70)

## 2022-07-10 MED ORDER — POTASSIUM CHLORIDE 10 MEQ/100ML IV SOLN: 10.0000 meq | INTRAVENOUS | Status: DC

## 2022-07-10 MED ORDER — ATENOLOL 25 MG PO TABS
25.0000 mg | ORAL_TABLET | Freq: Two times a day (BID) | ORAL | Status: DC
  Administered 2022-07-10 – 2022-07-16 (×12): 25 mg via ORAL
  Filled 2022-07-10 (×12): qty 1

## 2022-07-10 MED ORDER — ATENOLOL 25 MG PO TABS
12.5000 mg | ORAL_TABLET | Freq: Every day | ORAL | Status: DC
  Administered 2022-07-10: 12.5 mg via ORAL
  Filled 2022-07-10 (×2): qty 0.5

## 2022-07-10 MED ORDER — POTASSIUM CHLORIDE 10 MEQ/100ML IV SOLN
10.0000 meq | INTRAVENOUS | Status: AC
  Administered 2022-07-10 (×2): 10 meq via INTRAVENOUS
  Filled 2022-07-10 (×2): qty 100

## 2022-07-10 MED ORDER — POTASSIUM CHLORIDE CRYS ER 20 MEQ PO TBCR
40.0000 meq | EXTENDED_RELEASE_TABLET | ORAL | Status: AC
  Administered 2022-07-10 (×2): 40 meq via ORAL
  Filled 2022-07-10 (×2): qty 2

## 2022-07-10 NOTE — Progress Notes (Addendum)
STROKE TEAM PROGRESS NOTE   INTERVAL HISTORY Patient is seen in her room with no family at the bedside.  AxO4 today. States she is cold, reporting pain in right arm likely due to infiltrated IV Remains on heparin Repeat CT scan yesterday stable  Vitals:   07/10/22 0600 07/10/22 0630 07/10/22 0700 07/10/22 0800  BP: 132/65 (!) 149/74 133/77   Pulse: 82 76 65   Resp: '15 14 16   '$ Temp:    (!) 97.5 F (36.4 C)  TempSrc:    Axillary  SpO2: 95% 95% 99%   Weight:      Height:       CBC:  Recent Labs  Lab 07/08/22 1448 07/10/22 0538  WBC 7.3 7.2  NEUTROABS 4.5  --   HGB 15.0 15.0  HCT 45.3 42.5  MCV 97.2 92.8  PLT 230 947    Basic Metabolic Panel:  Recent Labs  Lab 07/08/22 1738 07/08/22 2225 07/10/22 0538  NA 139  --  138  K 3.5  --  2.6*  CL 106  --  102  CO2 27  --  22  GLUCOSE 125*  --  111*  BUN 9  --  8  CREATININE 0.65 0.68 0.65  CALCIUM 7.6*  --  6.8*  MG 1.8  --   --     Lipid Panel:  Recent Labs  Lab 07/09/22 0332  CHOL 207*  TRIG 84  HDL 82  CHOLHDL 2.5  VLDL 17  LDLCALC 108*    HgbA1c:  Recent Labs  Lab 07/09/22 0332  HGBA1C 5.8*    Urine Drug Screen: No results for input(s): "LABOPIA", "COCAINSCRNUR", "LABBENZ", "AMPHETMU", "THCU", "LABBARB" in the last 168 hours.  Alcohol Level No results for input(s): "ETH" in the last 168 hours.  IMAGING past 24 hours CT HEAD CODE STROKE WO CONTRAST  Result Date: 07/09/2022 CLINICAL DATA:  Code stroke.  Acute neuro deficit.  Rule out stroke. EXAM: CT HEAD WITHOUT CONTRAST TECHNIQUE: Contiguous axial images were obtained from the base of the skull through the vertex without intravenous contrast. RADIATION DOSE REDUCTION: This exam was performed according to the departmental dose-optimization program which includes automated exposure control, adjustment of the mA and/or kV according to patient size and/or use of iterative reconstruction technique. COMPARISON:  CT head 07/08/2022.  MRI head 07/08/2022  FINDINGS: Brain: Generalized atrophy. Mild white matter hypodensity bilaterally unchanged. Negative for acute infarct, hemorrhage, or mass. Small acute infarcts in the brainstem undo TBI yesterday are not identified by CT. Vascular: Negative for hyperdense vessel Skull: Negative Sinuses/Orbits: Mild mucosal edema right maxillary sinus. Remaining sinuses clear. Bilateral cataract extraction Other: None ASPECTS ([place] Stroke Program Early CT Score) - Ganglionic level infarction (caudate, lentiform nuclei, internal capsule, insula, M1-M3 cortex): 7 - Supraganglionic infarction (M4-M6 cortex): 3 Total score (0-10 with 10 being normal): 10 IMPRESSION: 1. No acute abnormality. Atrophy and mild chronic microvascular ischemia. 2. Aspects is 10. 3. These results were called by telephone at the time of interpretation on 07/09/2022 at 5:40 pm to provider [facility] , who verbally acknowledged these results. Electronically Signed   By: Franchot Gallo M.D.   On: 07/09/2022 17:41   ECHOCARDIOGRAM COMPLETE  Result Date: 07/09/2022    ECHOCARDIOGRAM REPORT   Patient Name:   Kristen Whitehead Date of Exam: 07/09/2022 Medical Rec #:  [id number]           Height:       59.0 in Accession #:    [id number]  Weight:       125.7 lb Date of Birth:  [birth year]           BSA:          1.514 m Patient Age:    14 years            BP:           139/86 mmHg Patient Gender: F                   HR:           59 bpm. Exam Location:  Inpatient Procedure: 2D Echo, Color Doppler and Cardiac Doppler Indications:    Stroke i63.9  History:        Patient has prior history of Echocardiogram examinations, most                 recent 12/11/2019. CHF, Arrythmias:Atrial Fibrillation; Risk                 Factors:Hypertension and Dyslipidemia.  Sonographer:    Raquel Sarna Senior RDCS Referring Phys: [id number] [place]  1. Left ventricular ejection fraction, by estimation, is 60 to 65%. The left ventricle has normal function. The  left ventricle has no regional wall motion abnormalities. Left ventricular diastolic parameters are indeterminate.  2. Right ventricular systolic function is normal. The right ventricular size is normal. There is normal pulmonary artery systolic pressure. The estimated right ventricular systolic pressure is 93.8 mmHg.  3. Left atrial size was moderately dilated.  4. Right atrial size was mildly dilated.  5. There is no evidence of cardiac tamponade.  6. 2D MVA 3.68 cm2.. The mitral valve is abnormal. Trivial mitral valve regurgitation. Severe mitral annular calcification.  7. The aortic valve was not well visualized. Aortic valve regurgitation is trivial. FINDINGS  Left Ventricle: Left ventricular ejection fraction, by estimation, is 60 to 65%. The left ventricle has normal function. The left ventricle has no regional wall motion abnormalities. The left ventricular internal cavity size was small. There is no left ventricular hypertrophy. Left ventricular diastolic parameters are indeterminate. Right Ventricle: The right ventricular size is normal. No increase in right ventricular wall thickness. Right ventricular systolic function is normal. There is normal pulmonary artery systolic pressure. The tricuspid regurgitant velocity is 2.86 m/s, and  with an assumed right atrial pressure of 3 mmHg, the estimated right ventricular systolic pressure is 10.1 mmHg. Left Atrium: Left atrial size was moderately dilated. Right Atrium: Right atrial size was mildly dilated. Pericardium: Trivial pericardial effusion is present. The pericardial effusion is circumferential. There is no evidence of cardiac tamponade. Mitral Valve: 2D MVA 3.68 cm2. The mitral valve is abnormal. Severe mitral annular calcification. Trivial mitral valve regurgitation. Tricuspid Valve: The tricuspid valve is normal in structure. Tricuspid valve regurgitation is mild . No evidence of tricuspid stenosis. Aortic Valve: The aortic valve was not well  visualized. Aortic valve regurgitation is trivial. Pulmonic Valve: The pulmonic valve was grossly normal. Pulmonic valve regurgitation is trivial. Aorta: The aortic root is normal in size and structure. IAS/Shunts: No atrial level shunt detected by color flow Doppler.  LEFT VENTRICLE PLAX 2D LVIDd:         3.40 cm   Diastology LVIDs:         2.50 cm   LV e' medial:    6.31 cm/s LV PW:         0.90 cm   LV E/e' medial:  16.6 LV IVS:  1.00 cm   LV e' lateral:   9.46 cm/s LVOT diam:     2.00 cm   LV E/e' lateral: 11.1 LV SV:         67 LV SV Index:   44 LVOT Area:     3.14 cm  RIGHT VENTRICLE RV S prime:     10.20 cm/s TAPSE (M-mode): 1.2 cm LEFT ATRIUM             Index        RIGHT ATRIUM           Index LA diam:        3.60 cm 2.38 cm/m   RA Area:     20.20 cm LA Vol (A2C):   64.5 ml 42.61 ml/m  RA Volume:   51.20 ml  33.82 ml/m LA Vol (A4C):   61.1 ml 40.36 ml/m LA Biplane Vol: 63.9 ml 42.21 ml/m  AORTIC VALVE LVOT Vmax:   93.30 cm/s LVOT Vmean:  66.700 cm/s LVOT VTI:    0.213 m  AORTA Ao Root diam: 2.90 cm MITRAL VALVE                TRICUSPID VALVE MV Area (PHT): 5.23 cm     TR Peak grad:   32.7 mmHg MV Decel Time: 145 msec     TR Vmax:        286.00 cm/s MV E velocity: 105.00 cm/s MV A velocity: 31.60 cm/s   SHUNTS MV E/A ratio:  3.32         Systemic VTI:  0.21 m                             Systemic Diam: 2.00 cm Rudean Haskell MD Electronically signed by Rudean Haskell MD Signature Date/Time: 07/09/2022/9:46:21 AM    Final     PHYSICAL EXAM General: Alert, well-developed, well-nourished elderly patient in no acute distress Respiratory: Regular, unlabored respirations on room air  NEURO:  Mental Status: AA&Ox3  Speech/Language: speech is without dysarthria or aphasia.  Naming, fluency, and comprehension intact.  Cranial Nerves:  II: Right pupil 4 mm and sluggish her baseline due to macular degeneration, left pupil 3 mm and briskly reactive III, IV, VI: EOMI.  V: Sensation  is intact to light touch and symmetrical to face.  VII: Smile is symmetrical.   VIII: hearing intact to voice. IX, X: Phonation is normal.  XII: tongue is midline without fasciculations. Motor: 5/5 strength to all muscle groups tested.  Tone: is normal and bulk is normal Sensation- Intact to light touch bilaterally.   Coordination: FTN intact bilaterally.  No drift.  Gait- deferred    ASSESSMENT/PLAN Kristen Whitehead is a 88 y.o. female with history of atrial fibrillation not on anticoagulation due to falls, GERD, hyperlipidemia, hypertension, hypothyroidism and macular degeneration of bilateral eyes worse on the right with dizziness, nausea and generalized weakness.  She was found to have a thrombus at the tip of the basilar artery, but due to lack of symptoms thrombectomy was deferred and patient was placed on heparin drip with careful observation in the ICU.    Stroke: Small acute infarcts in left midbrain and right medial pons Thrombus of basilar tip Etiology: Likely cardioembolic in setting of atrial fibrillation off anticoagulation CT head No acute abnormality. Small vessel disease.  CTA head & neck occlusion of distal basilar artery MRI small acute infarcts in left midbrain and right  medial pons 2D Echo EF 60 to 65%, moderately dilated left atrium, no atrial level shunt LDL 108 HgbA1c 5.8 VTE prophylaxis -fully anticoagulated with heparin    Diet   Diet regular Room service appropriate? Yes; Fluid consistency: Thin   No antithrombotic prior to admission, now on heparin IV.  Therapy recommendations:  SNF Disposition: Pending  Hypertension Home meds: None Stable Permissive hypertension (OK if < 220/120) but gradually normalize in 5-7 days Long-term BP goal normotensive  Hyperlipidemia Home meds: None LDL 108, goal < 70 Add ezetimibe 10 mg daily High intensity statin not indicated due to previous intolerance Continue statin at discharge  Atrial  fibrillation Patient has history of atrial fibrillation, not on anticoagulation due to frequent falls  heparin IV as inpatient due to basilar tip thrombus  Other Stroke Risk Factors Advanced Age >/= 36   Other Active Problems Severely low K 2.6. Stat IV K given followed by PO and repeat K was delayed due to lab issues. Nurse eventually walked down repeat specimen to the lab herself.  Hospital day # 2  Patient seen and examined by NP/APP with MD. MD to update note as needed.   Janine Ores, DNP, FNP-BC [facility] Pager: [phone]  ATTENDING ATTESTATION:   88 year old with overall amazing baseline with acute left midbrain right pontine CVA in The basilar thrombus on heparin drip low-dose.  Overall appears to be stable.  Her exam again is largely unremarkable.  Right pupil sluggish but that is her baseline for her macular degeneration.  Face symmetric.  Answering all questions.  No weakness on exam.  Sensation is intact.  Her potassium has been low and there is been some issues getting it repeated as lab is lost blood and had to be redrawn.  She was given IV potassium and then later p.o.  She has A-fib with RVR becomes tachycardic when she gets agitated.  Atenolol added home dose.  Cleviprex has been titrated off and held for now.  Will continue to monitor.  Plan to heparinize for 5 to 7 days then consider transitioning to oral anticoagulation if family is amenable.  Otherwise may transition to DAPT.    Dr. Reeves Forth evaluated pt independently, reviewed imaging, chart, labs. Discussed and formulated plan with the Resident/APP. Changes were made to the note where appropriate. Please see APP/resident note above for details.      This patient is critically ill due to acute stroke with tip of the basilar thrombus and at significant risk of neurological worsening, death form heart failure, respiratory failure, recurrent stroke, bleeding from [facility], seizure, sepsis. This patient's care  requires constant monitoring of vital signs, hemodynamics, respiratory and cardiac monitoring, review of multiple databases, neurological assessment, discussion with family, other specialists and medical decision making of high complexity. I spent 35 minutes of neurocritical care time in the care of this patient.    Daria Mcmeekin,MD    To contact Stroke Continuity provider, please refer to [email or web address] After hours, contact General Neurology

## 2022-07-10 NOTE — TOC Initial Note (Signed)
Transition of Care [facility]) - Initial/Assessment Note    Patient Details  Name: Kristen Whitehead MRN: [id number] Date of Birth: 04-24-1923  Transition of Care [facility]) CM/SW Contact:    Benard Halsted, [place] Phone Number: 07/10/2022, 4:36 PM  Clinical Narrative:                 CSW received consult for possible SNF placement at time of discharge. CSW spoke with patient and daughter and son in law at bedside. Patient reported that she lives alone and family is currently unable to care for patient at their home given patient's current physical needs and fall risk. Patient expressed understanding of PT recommendation and is agreeable to SNF placement at time of discharge. Patient reports preference for [facility]. CSW discussed insurance authorization process and will provide Medicare SNF ratings list. CSW will send out referrals for review and provide bed offers as available.   Skilled Nursing Rehab Facilities-   [email or web address]   Ratings out of 5 stars (5 the highest)   Name Address  Phone # [place] Inspection Overall  [facility] [address] [place] '4 5 2 3  '$ [facility]  [address] [place], [place] [phone] '4 2 5 5  '$ [facility] [place], [place] '1 3 1 1  '$ [place], [place] '2 2 4 4  '$ [facility] [address] [place] '2 1 2 1  '$ [place], [place] [phone] '3 3 4 4  '$ [facility] [place] [facility] [address] [place] '4 1 3 2  '$ [facility] [address], [place] '4 1 3 2  '$ [address] ([place]) [place], [place] [phone] '3 1 2 1  '$ [facility] [phone] [address], [place] [phone] '3 1 1 1  '$ [facility] [address], [facility] [phone] '3 2 2 2  '$ [facility] [place]. [place], [place] [phone] '3 1 1 1  '$ Dustin Flock [address] [place]i [place] [phone] '4 2 4 4          '$ [place] [address], [place] '4 1 3 2  '$ [facility] [place] [address] [place] '3 1 5 4  '$ [place], [place] [phone] '1 1 2 1  '$ [facility] [address] [employer] [phone] '2 2 4 4          '$ [address] (no [facility]) [place] [address], [place] [phone] '5 5 5 5  '$ [facility] (No Humana) [address], [place] '4 1 4 3  '$ [facility] (No UHC) [place], [place] [phone] '5 5 5 5  '$ [facility] [address], [employer] [phone] '2 3 5 5  '$ [place] [place] [address] [place] '1 1 2 1  '$ [facility] [address], [place] [phone] '3 1 1 1  '$ [facility], [place] '5 2 5 5  '$ [facility]  [address], [place] '2 2 1 1  '$ [place] [address], [place] '3 2 1 1  '$ [facility] [place], [place] '2 2 2 2          '$ [facility] [address] [place] '1 1 1 1  '$ Wyvonna Plum  [address], [place]  [phone] '2 4 3 3  '$ [facility] [place] [address], [place] [phone] '3 2 3 3  '$ [place] [address] [place] '2 1 1 1  '$ [place] (No Humana) [address], [place] [phone] '2 2 3 3  '$ [facility]) [address], [place] [phone] '2 1 1 1          '$ [facility] [place], [place] '5 4 5 5  '$ [facility])  [address], [place] '2 1 2 1  '$ [facility]) [place], [place] '3 1 4 3  '$ [place] [address], [place] '3 3 4 4  '$ [address]  [place], [place] '2 3 1 1  '$ [facility]) [address] Steward [phone] '2 1 4 3     '$ Expected Discharge Plan: [place] Barriers to Discharge: Continued Medical Work up, Ship broker, SNF Pending bed offer   Patient Goals and CMS Choice Patient states their goals for this hospitalization and ongoing recovery are:: Rehab CMS Medicare.gov Compare Post Acute Care list provided to:: Patient Choice offered to / list presented to : Patient  Expected Discharge Plan and Services Expected Discharge Plan: [place] In-house Referral: Clinical Social Work   Post Acute Care Choice: [place] Living arrangements for the past 2 months: [place]                                      Prior Living Arrangements/Services Living arrangements for the past 2 months: Single Family Home Lives with:: Self Patient language and need for interpreter reviewed:: Yes Do you feel safe going back to the place where you live?: Yes      Need for Family Participation in Patient Care: Yes (Comment) Care giver support system in place?: Yes (comment)   Criminal Activity/Legal Involvement Pertinent to Current Situation/Hospitalization: No - Comment as needed  Activities of Daily Living      Permission Sought/Granted Permission sought to share information with : Facility Sport and exercise psychologist, Family Supports Permission granted to share information with : Yes, Verbal Permission Granted  Share Information with NAME: Mitzi  Permission granted to share info w AGENCY: SNFs  Permission granted to share info w Relationship: Daughter  Permission granted to share info w Contact Information: [phone]  Emotional Assessment Appearance:: Appears stated age Attitude/Demeanor/Rapport: Engaged Affect (typically observed): Accepting, Appropriate, Pleasant Orientation: : Oriented to Self,  Oriented to Place, Oriented to  Time, Oriented to Situation Alcohol / Substance Use: Not Applicable Psych Involvement: No (comment)  Admission diagnosis:  Transient alteration of awareness [R40.4] Basilar artery occlusion [I65.1] 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] Patient Active Problem List   Diagnosis Date Noted   Basilar artery thrombosis 07/08/2022   Chronic diastolic heart failure ([place]) 12/11/2019   Acute cystitis without hematuria    Acute respiratory failure with hypoxia (HCC) 12/10/2019   Localized swelling, mass and lump, upper limb 05/06/2019   Pain in joint of left elbow 05/06/2019   Malignant tumor of thyroid gland ([place]) 09/14/2018   Chronic anticoagulation 11/18/2016   Weakness 11/10/2014   Acute on chronic diastolic CHF (congestive heart failure) ([place]) 02/01/2014   Dyspnea 02/01/2014   Encounter for therapeutic drug monitoring 09/16/2013   Chest pain at  rest 03/12/2012   Cough 09/16/2011   Fatigue 04/08/2011   Essential hypertension 02/11/2011   Mitral regurgitation 02/11/2011   Hypothyroidism 02/11/2011   Hypercholesterolemia 02/11/2011   Atrial fibrillation ([place]) 12/03/2010   PCP:  [name], NP Pharmacy:   [facility] [phone] [address] [place], [place] [address] Phone: [phone] Fax: [phone]  [place], [address] [facility] [place] [address] Phone: [phone] Fax: [place], [place] - [place] [address] [place] [address] Phone: [phone] Fax: [place], [address] [place] [address] Phone: [phone] Fax: [phone]     Social Determinants of Health (SDOH) Interventions    Readmission Risk Interventions     No data to display

## 2022-07-10 NOTE — Progress Notes (Signed)
Noted that lab had not resulted from previous ordered potassium lab draw. Reached out to lab and was informed that specimen had been lost and would be redrawn.

## 2022-07-10 NOTE — Progress Notes (Signed)
[place] for heparin Indication:  tip of basilar thrombus, per stroke protocol  Heparin Dosing Weight: 54.8 kg  Labs: Recent Labs    07/08/22 1448 07/08/22 1738 07/08/22 2225 07/09/22 1925 07/10/22 0538 07/10/22 1521  HGB 15.0  --   --   --  15.0  --   HCT 45.3  --   --   --  42.5  --   PLT 230  --   --   --  198  --   HEPARINUNFRC  --   --   --  0.49 0.67 0.42  CREATININE  --  0.65 0.68  --  0.65  --      Assessment: 10 yof with hx afib not on anticoagulation PTA (recently taken off apixaban) due hx of multiple falls presenting with acute basilar artery thrombosis. Pharmacy consulted to start heparin drip per stroke protocol. Patient not on anticoagulation PTA. Acute neuro change 11/21 PM but stat head CT did not show bleed or significant change.  Heparin level trended up to 0.67 - supratherapeutic for lower goal despite rate decrease overnight. CBC and SCr WNL. No bleed or issues with infusion reported per discussion with RN.  PM f/u > heparin level now in goal range at 0.42.  No overt bleeding or complications noted.  Goal of Therapy:  Heparin level 0.3-0.5 units/ml, no boluses Monitor platelets by anticoagulation protocol: Yes   Plan:  Continue IV heparin at 500 units/hr Monitor daily CBC, s/sx bleeding Daily heparin levels  [place] Crane, Roylene Reason, [facility] Clinical Pharmacist  07/10/2022 4:45 PM   [facility] pharmacy phone numbers are listed on [email or web address]

## 2022-07-10 NOTE — Telephone Encounter (Addendum)
Patient moved to SNF

## 2022-07-10 NOTE — Progress Notes (Signed)
Cleviprex gtt ordered w/ sys goal: 140. Current BP: 118/62 (79).  Cleviprex off @ 2044. Stroke MD notified, orders given to continue to monitor.

## 2022-07-10 NOTE — Evaluation (Signed)
Speech Language Pathology Evaluation Patient Details Name: Kristen Whitehead MRN: [id number] DOB: 02-Jan-1923 Today's Date: 07/10/2022 Time: 8242-3536 SLP Time Calculation (min) (ACUTE ONLY): 24 min  Problem List:  Patient Active Problem List   Diagnosis Date Noted   Basilar artery thrombosis 07/08/2022   Chronic diastolic heart failure ([place]) 12/11/2019   Acute cystitis without hematuria    Acute respiratory failure with hypoxia ([place]) 12/10/2019   Localized swelling, mass and lump, upper limb 05/06/2019   Pain in joint of left elbow 05/06/2019   Malignant tumor of thyroid gland ([place]) 09/14/2018   Chronic anticoagulation 11/18/2016   Weakness 11/10/2014   Acute on chronic diastolic CHF (congestive heart failure) ([place]) 02/01/2014   Dyspnea 02/01/2014   Encounter for therapeutic drug monitoring 09/16/2013   Chest pain at rest 03/12/2012   Cough 09/16/2011   Fatigue 04/08/2011   Essential hypertension 02/11/2011   Mitral regurgitation 02/11/2011   Hypothyroidism 02/11/2011   Hypercholesterolemia 02/11/2011   Atrial fibrillation ([place]) 12/03/2010   Past Medical History:  Past Medical History:  Diagnosis Date   Bladder prolapse, female, acquired    Cancer [facility])    thyroid   Chest pain    no known ischemic heart disease; negative Myoview July 2013. EF 75% with no ischemia.    Chronic anticoagulation    Chronic atrial fibrillation (HCC)    managed with rate control and coumadin   Chronic diastolic heart failure (HCC)    GERD (gastroesophageal reflux disease)    Heart disease    History of congenital mitral regurgitation    mild   History of heart attack    Multiple    History of mitral valve prolapse 06/15/2008   a. echo 1/14: mild LVH, EF 60-65%, mild MR, mild to mod BAE, PASP 35   Hypercholesterolemia    Hypertension    Hypothyroidism    Past Surgical History:  Past Surgical History:  Procedure Laterality Date   ABDOMINAL HYSTERECTOMY     CATARACT EXTRACTION      CHOLECYSTECTOMY     Thyroidectomy     TONSILLECTOMY     HPI:  Pt is a 88 y.o. female who presented 07/08/22 with intermittent nausea, generalized weakness, episode of aphasia and diplopia. MRI brain: Small acute infarcts in the left midbrain and right medial pons. CT revealed distal basilar thrombus. PMH: chronic atrial fibrillation who is currently not on anticoagulation due to history of multiple falls, GERD, hypercholesterolemia, hypertension, hypothyroidism, macular degeneration of bilateral eyes with left worse than right who was recently taken off of her Eliquis for multiple falls.   Assessment / Plan / Recommendation Clinical Impression  Pt participated in speech-language-cognition evaluation. She reported that she has a bachelor's degree, and is a retired Oncologist. Per the pt, she was living alone and was independent with medication and financial management prior to admission. Pt initially denied any changes in cognition, but at the end of the evaluation she reported that her thinking was "not good" and she questioned what her level of independence would be at discharge. [facility] Mental Status Examination was completed to evaluate the pt's cognitive-linguistic skills. She achieved a score of 27/30 which is within the normal limits of 27 or more out of 30. However, this and informal assessment revealed impairments in memory, awareness, and executive function which appear to be an acute change from high baseline. Motor speech and language skills were [facility]. Skilled SLP services are clinically indicated at this time to improve cognitive-linguistic function.  SLP Assessment  SLP Recommendation/Assessment: Patient needs continued Speech [facility] Services SLP Visit Diagnosis: Cognitive communication deficit (R41.841)    Recommendations for follow up therapy are one component of a multi-disciplinary discharge planning process, led by the attending physician.   Recommendations may be updated based on patient status, additional functional criteria and insurance authorization.    Follow Up Recommendations  Skilled nursing-short term rehab (<3 hours/day)    Assistance Recommended at Discharge  Intermittent Supervision/Assistance  Functional Status Assessment Patient has had a recent decline in their functional status and demonstrates the ability to make significant improvements in function in a reasonable and predictable amount of time.  Frequency and Duration min 2x/week  2 weeks      SLP Evaluation Cognition  Overall Cognitive Status: Impaired/Different from baseline Arousal/Alertness: Awake/alert Orientation Level: Oriented X4 Year: 2023 Month: November Day of Week: Correct Attention: Focused;Sustained Focused Attention: Appears intact Sustained Attention: Impaired Sustained Attention Impairment: Verbal complex Memory: Impaired Memory Impairment: Storage deficit;Retrieval deficit (Immediate: 5/5; delayed: 4/5 with cues: 1/1) Awareness: Impaired Awareness Impairment: Emergent impairment Problem Solving: Impaired Problem Solving Impairment: Verbal complex (time: 0/1; money: 3/3 with repetition) Executive Function: Sequencing;Organizing Sequencing: Impaired Sequencing Impairment: Verbal complex (clock: 2/4) Organizing:  (backward digit span: 2/4 with additional processing time) Safety/Judgment: Appears intact       Comprehension  Auditory Comprehension Overall Auditory Comprehension: Appears within functional limits for tasks assessed Yes/No Questions: Within Functional Limits Commands: Within Functional Limits Conversation: Complex    Expression Expression Primary Mode of Expression: Verbal Verbal Expression Overall Verbal Expression: Appears within functional limits for tasks assessed Initiation: No impairment Level of Generative/Spontaneous Verbalization: Conversation Repetition: No impairment Naming: No  impairment Pragmatics: No impairment   Oral / Motor  Oral Motor/Sensory Function Overall Oral Motor/Sensory Function: Within functional limits Motor Speech Overall Motor Speech: Appears within functional limits for tasks assessed Respiration: Within functional limits Resonance: Within functional limits Articulation: Within functional limitis Intelligibility: Intelligible Motor Planning: Witnin functional limits           Hayze Gazda I. Hardin Negus, [place], [place] Office number [phone]  Horton Marshall 07/10/2022, 3:42 PM

## 2022-07-10 NOTE — Progress Notes (Signed)
Date and time results received: 07/10/22 0715  Test: K+ Critical Value: 2.6  Name of Provider Notified: Leonel Ramsay, MD

## 2022-07-10 NOTE — Plan of Care (Signed)
Notified of severely low K, 2.6, stable and normal previously, will order recheck and 1 run IV K, if still low on recheck, will need additional repletion.   Roland Rack, MD [facility] [phone]  If 7pm- 7am, please page neurology on call as listed in [place].

## 2022-07-10 NOTE — Progress Notes (Signed)
[place] for heparin Indication:  tip of basilar thrombus, per stroke protocol  Heparin Dosing Weight: 54.8 kg  Labs: Recent Labs    07/08/22 1448 07/08/22 1738 07/08/22 2225 07/09/22 1925 07/10/22 0538  HGB 15.0  --   --   --  15.0  HCT 45.3  --   --   --  42.5  PLT 230  --   --   --  198  HEPARINUNFRC  --   --   --  0.49 0.67  CREATININE  --  0.65 0.68  --  0.65     Assessment: 23 yof with hx afib not on anticoagulation PTA (recently taken off apixaban) due hx of multiple falls presenting with acute basilar artery thrombosis. Pharmacy consulted to start heparin drip per stroke protocol. Patient not on anticoagulation PTA. Acute neuro change 11/21 PM but stat head CT did not show bleed or significant change.  Heparin level trended up to 0.67 - supratherapeutic for lower goal despite rate decrease overnight. CBC and SCr WNL. No bleed or issues with infusion reported per discussion with RN.  Goal of Therapy:  Heparin level 0.3-0.5 units/ml, no boluses Monitor platelets by anticoagulation protocol: Yes   Plan:  Decrease heparin infusion to 500 units/hr Check 8hr heparin level Monitor daily CBC, s/sx bleeding   Arturo Morton, PharmD, BCPS Clinical Pharmacist 07/10/2022 7:44 AM

## 2022-07-10 NOTE — Progress Notes (Signed)
While performing 0200 NIH assessment pt began reporting nausea and subsequently began dry heaving with a simultaneous increase in HR and BP, pt remained in Afib w. HR reaching the 130s and BP rose to 182/90. PRN Zofran given and cleviprex gtt was re-started but promptly turned off as pressure dropped back to 122/62. Pt nausea subsided after approx 5 min and she is now resting calmly. Dr. Lorrin Goodell notified, orders given to continue Q1 neuro checks and administer PRN metoprolol if HR sustains above 120.

## 2022-07-10 NOTE — Plan of Care (Signed)
  Problem: Education: Goal: Knowledge of disease or condition will improve Outcome: Progressing   Problem: Ischemic Stroke/TIA Tissue Perfusion: Goal: Complications of ischemic stroke/TIA will be minimized Outcome: Progressing   Problem: Coping: Goal: Will identify appropriate support needs Outcome: Progressing   Problem: Health Behavior/Discharge Planning: Goal: Ability to manage health-related needs will improve Outcome: Progressing   Problem: Self-Care: Goal: Ability to participate in self-care as condition permits will improve Outcome: Progressing Goal: Verbalization of feelings and concerns over difficulty with self-care will improve Outcome: Progressing Goal: Ability to communicate needs accurately will improve Outcome: Progressing   Problem: Nutrition: Goal: Risk of aspiration will decrease Outcome: Progressing Goal: Dietary intake will improve Outcome: Progressing

## 2022-07-10 NOTE — NC FL2 (Signed)
[place] LEVEL OF CARE SCREENING TOOL     IDENTIFICATION  Patient Name: Kristen Whitehead Birthdate: 14-Nov-1922 Sex: female Admission Date (Current Location): 07/08/2022  [facility] and [place] Number:  Herbalist and Address:  [facility] 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/2022   Chronic diastolic heart failure ([place]) 12/11/2019   Acute cystitis without hematuria    Acute respiratory failure with hypoxia ([place]) 12/10/2019   Localized swelling, mass and lump, upper limb 05/06/2019   Pain in joint of left elbow 05/06/2019   Malignant tumor of thyroid gland ([place]) 09/14/2018   Chronic anticoagulation 11/18/2016   Weakness 11/10/2014   Acute on chronic diastolic CHF (congestive heart failure) ([place]) 02/01/2014   Dyspnea 02/01/2014   Encounter for therapeutic drug monitoring 09/16/2013   Chest pain at rest 03/12/2012   Cough 09/16/2011   Fatigue 04/08/2011   Essential hypertension 02/11/2011   Mitral regurgitation 02/11/2011   Hypothyroidism 02/11/2011   Hypercholesterolemia 02/11/2011   Atrial fibrillation ([place]) 12/03/2010    Orientation RESPIRATION BLADDER Height & Weight     Self, Time, Situation, Place  Normal Incontinent, External catheter Weight: 125 lb 10.6 oz (57 kg) Height:  '4\' 11"'$  (149.9 cm)  BEHAVIORAL SYMPTOMS/MOOD NEUROLOGICAL BOWEL NUTRITION STATUS      Continent Diet (See dc summary)  AMBULATORY STATUS COMMUNICATION OF NEEDS Skin   Extensive Assist Verbally Normal                        Personal Care Assistance Level of Assistance  Bathing, Feeding, Dressing Bathing Assistance: Maximum assistance Feeding assistance: Limited assistance Dressing Assistance: Limited assistance     Functional Limitations Info             SPECIAL CARE FACTORS FREQUENCY  PT (By licensed PT), OT (By licensed OT)     PT Frequency: 5x/week OT Frequency: 5x/week            Contractures Contractures Info: Not present    Additional Factors Info  Code Status, Allergies Code Status Info: Full Allergies Info: Azithromycin, Compazine (Prochlorperazine Edisylate), Demerol, Dolophine (Methadone), Ebastine, Erythromycin, Gluten Meal, Ibuprofen, Meperidine Hcl, Morphine Sulfate, Statins, Tetanus Toxoids, Tetracycline, Tetracyclines & Related           Current Medications (07/10/2022):  This is the current hospital active medication list Current Facility-Administered Medications  Medication Dose Route Frequency Provider Last Rate Last Admin   0.9 %  sodium chloride infusion   Intravenous Continuous Donnetta Simpers, MD 100 mL/hr at 07/10/22 1304 Infusion Verify at 07/10/22 1304   acetaminophen (TYLENOL) tablet 650 mg  650 mg Oral Q4H PRN Donnetta Simpers, MD       Or   acetaminophen (TYLENOL) 160 MG/5ML solution 650 mg  650 mg Per Tube Q4H PRN Donnetta Simpers, MD       Or   acetaminophen (TYLENOL) suppository 650 mg  650 mg Rectal Q4H PRN Donnetta Simpers, MD       atenolol (TENORMIN) tablet 25  mg  25 mg Oral BID Janine Ores, NP       Chlorhexidine Gluconate Cloth 2 % PADS 6 each  6 each Topical Q0600 Donnetta Simpers, MD   6 each at 07/10/22 0535   clevidipine (CLEVIPREX) infusion 0.5 mg/mL  0-21 mg/hr Intravenous Continuous Dorene Grebe, MD   Stopped at 07/10/22 0619   dorzolamide (TRUSOPT) 2 % ophthalmic solution 1 drop  1 drop Left Eye BID Donnetta Simpers, MD   1 drop at 07/10/22 1125   ezetimibe (ZETIA) tablet 10 mg  10 mg Oral Daily de Yolanda Manges,  Cortney E, NP   10 mg at 07/10/22 1135   heparin ADULT infusion 100 units/mL (25000 units/229m)  500 Units/hr Intravenous Continuous von Dohlen, HHildred AlaminB, RPH 5 mL/hr at 07/10/22 1304 500 Units/hr at 07/10/22 1304   latanoprost (XALATAN) 0.005 % ophthalmic solution 1 drop  1 drop Both Eyes QHS KDonnetta Simpers MD   1 drop at 07/09/22 2154   levothyroxine (SYNTHROID) tablet 75 mcg  75 mcg Oral Q0600 KDonnetta Simpers MD   75 mcg at 07/10/22 0535   metoprolol tartrate (LOPRESSOR) injection 5 mg  5 mg Intravenous Q6H PRN KDonnetta Simpers MD       multivitamin with minerals tablet 1 tablet  1 tablet Oral Daily KDonnetta Simpers MD   1 tablet at 07/10/22 1135   ondansetron (ZOFRAN) injection 4 mg  4 mg Intravenous Q6H PRN PDorene Grebe MD   4 mg at 07/10/22 0830   Oral care mouth rinse  15 mL Mouth Rinse PRN KDonnetta Simpers MD       pantoprazole (PROTONIX) EC tablet 40 mg  40 mg Oral Daily KDonnetta Simpers MD   40 mg at 07/10/22 1135   senna-docusate (Senokot-S) tablet 1 tablet  1 tablet Oral QHS PRN KDonnetta Simpers MD       sodium chloride (MURO 128) 5 % ophthalmic solution 1 drop  1 drop Right Eye QHS KDonnetta Simpers MD   1 drop at 07/09/22 2156   temazepam (RESTORIL) capsule 15 mg  15 mg Oral QHS PRN KDonnetta Simpers MD         Discharge Medications: Please see discharge summary for a list of discharge medications.  Relevant Imaging Results:  Relevant Lab Results:   Additional Information SSN: [id number] NBenard Halsted LCSW

## 2022-07-11 DIAGNOSIS — I651 Occlusion and stenosis of basilar artery: Secondary | ICD-10-CM | POA: Diagnosis not present

## 2022-07-11 LAB — BASIC METABOLIC PANEL
Anion gap: 10 (ref 5–15)
BUN: 13 mg/dL (ref 8–23)
CO2: 21 mmol/L — ABNORMAL LOW (ref 22–32)
Calcium: 6.8 mg/dL — ABNORMAL LOW (ref 8.9–10.3)
Chloride: 109 mmol/L (ref 98–111)
Creatinine, Ser: 0.68 mg/dL (ref 0.44–1.00)
GFR, Estimated: >60 mL/min (ref >60)
Glucose, Bld: 88 mg/dL (ref 70–99)
Potassium: 5 mmol/L (ref 3.5–5.1)
Sodium: 140 mmol/L (ref 135–145)

## 2022-07-11 LAB — CBC
HCT: 40.2 % (ref 36.0–46.0)
Hemoglobin: 13.9 g/dL (ref 12.0–15.0)
MCH: 32.8 pg (ref 26.0–34.0)
MCHC: 34.6 g/dL (ref 30.0–36.0)
MCV: 94.8 fL (ref 80.0–100.0)
Platelets: 113 10*3/uL — ABNORMAL LOW (ref 150–400)
RBC: 4.24 MIL/uL (ref 3.87–5.11)
RDW: 15.4 % (ref 11.5–15.5)
WBC: 8 10*3/uL (ref 4.0–10.5)
nRBC: 0 % (ref 0.0–0.2)

## 2022-07-11 LAB — HEPARIN LEVEL (UNFRACTIONATED)
Heparin Unfractionated: 0.19 IU/mL — ABNORMAL LOW (ref 0.30–0.70)
Heparin Unfractionated: 0.32 IU/mL (ref 0.30–0.70)

## 2022-07-11 MED ORDER — POLYVINYL ALCOHOL 1.4 % OP SOLN
1.0000 [drp] | OPHTHALMIC | Status: DC | PRN
  Administered 2022-07-13 – 2022-07-16 (×3): 1 [drp] via OPHTHALMIC
  Filled 2022-07-11 (×2): qty 15

## 2022-07-11 MED ORDER — NAPHAZOLINE-GLYCERIN 0.012-0.25 % OP SOLN: 1.0000 [drp] | Freq: Four times a day (QID) | OPHTHALMIC | Status: DC | PRN

## 2022-07-11 MED ORDER — AMLODIPINE BESYLATE 5 MG PO TABS
5.0000 mg | ORAL_TABLET | Freq: Every day | ORAL | Status: DC
  Administered 2022-07-11 – 2022-07-16 (×6): 5 mg via ORAL
  Filled 2022-07-11 (×6): qty 1

## 2022-07-11 NOTE — Progress Notes (Addendum)
STROKE TEAM PROGRESS NOTE   INTERVAL HISTORY Patient is seen in her room with no family at the bedside. States she is feeling a lot better today.  Remains on heparin IV. Plan to transfer out of ICU today Plan to transition to Eliquis tomorrow   Vitals:   07/11/22 0600 07/11/22 0700 07/11/22 0800 07/11/22 0834  BP: 129/74 (!) 165/86 (!) 154/89   Pulse: 66 73 78   Resp: '18 18 16   '$ Temp:      TempSrc:      SpO2: 94% 96% 91% 93%  Weight:      Height:       CBC:  Recent Labs  Lab 07/08/22 1448 07/10/22 0538 07/11/22 0247  WBC 7.3 7.2 8.0  NEUTROABS 4.5  --   --   HGB 15.0 15.0 13.9  HCT 45.3 42.5 40.2  MCV 97.2 92.8 94.8  PLT 230 198 113*    Basic Metabolic Panel:  Recent Labs  Lab 07/08/22 1738 07/08/22 2225 07/10/22 1521 07/11/22 0247  NA 139   < > 138 140  K 3.5   < > 2.9* 5.0  CL 106   < > 103 109  CO2 27   < > 22 21*  GLUCOSE 125*   < > 92 88  BUN 9   < > 9 13  CREATININE 0.65   < > 0.69 0.68  CALCIUM 7.6*   < > 6.8* 6.8*  MG 1.8  --   --   --    < > = values in this interval not displayed.    Lipid Panel:  Recent Labs  Lab 07/09/22 0332  CHOL 207*  TRIG 84  HDL 82  CHOLHDL 2.5  VLDL 17  LDLCALC 108*    HgbA1c:  Recent Labs  Lab 07/09/22 0332  HGBA1C 5.8*    Urine Drug Screen: No results for input(s): "LABOPIA", "COCAINSCRNUR", "LABBENZ", "AMPHETMU", "THCU", "LABBARB" in the last 168 hours.  Alcohol Level No results for input(s): "ETH" in the last 168 hours.  IMAGING past 24 hours No results found.  PHYSICAL EXAM General: Alert, well-developed, well-nourished elderly patient in no acute distress Respiratory: Regular, unlabored respirations on room air  NEURO:  Mental Status: AA&Ox3  Speech/Language: speech is without dysarthria or aphasia.  Naming, fluency, and comprehension intact.  Cranial Nerves:  II: Right pupil 4 mm and sluggish her baseline due to macular degeneration, left pupil 3 mm and briskly reactive III, IV, VI: EOMI.   V: Sensation is intact to light touch and symmetrical to face.  VII: Smile is symmetrical.   VIII: hearing intact to voice. IX, X: Phonation is normal.  XII: tongue is midline without fasciculations. Motor: No drift in upper or lower extremities Tone: is normal and bulk is normal Sensation- Intact to light touch bilaterally.   Coordination: No ataxia Gait- deferred    ASSESSMENT/PLAN Kristen Whitehead is a 88 y.o. female with history of atrial fibrillation not on anticoagulation due to falls, GERD, hyperlipidemia, hypertension, hypothyroidism and macular degeneration of bilateral eyes worse on the right with dizziness, nausea and generalized weakness.  She was found to have a thrombus at the tip of the basilar artery, but due to lack of symptoms thrombectomy was deferred and patient was placed on heparin drip with careful observation in the ICU.  Episodes of A-fib with RVR improved with addition of atenolol home dose.  Transfer out of the ICU today 11/23.  Stroke: Small acute infarcts in left midbrain  and right medial pons Thrombus of basilar tip Etiology: Likely cardioembolic in setting of atrial fibrillation off anticoagulation CT head No acute abnormality. Small vessel disease.  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 Yes; Fluid consistency: Thin   No antithrombotic prior to admission, now on heparin IV.  Therapy recommendations:  SNF Disposition: Pending  Hypertension Home meds: None Stable Permissive hypertension (OK if < 220/120) but gradually normalize in 5-7 days Long-term BP goal normotensive  Hyperlipidemia Home meds: None LDL 108, goal < 70 Add ezetimibe 10 mg daily High intensity statin  not indicated due to previous intolerance Continue statin at discharge  Atrial fibrillation Patient has history of atrial fibrillation, not on anticoagulation due to frequent falls  heparin IV as inpatient due to basilar tip thrombus  Other Stroke Risk Factors Advanced Age >/= 29   Other Active Problems Hypokalemia improving.   Hospital day # 3  Patient seen and examined by NP/APP with MD. MD to update note as needed.   Janine Ores, DNP, FNP-BC [facility] Pager: [phone]  ATTENDING ATTESTATION:   88 year old with overall amazing baseline with acute left midbrain right pontine CVA in [place] basilar thrombus on heparin drip low-dose.  Severely low potassium from yesterday has resolved.  Doing well overnight.  No episodes of tachycardia.  Will move out of the ICU today.  Therapy is recommended SNF placement. Plan to heparinize for 5 to 7 days then consider transitioning to oral anticoagulation if family is amenable.  Otherwise may transition to DAPT.  Her exam is unchanged.  Right pupil sluggish but that is her baseline for her macular degeneration.  Face symmetric.  Answering all questions.  No weakness on exam.  Sensation is intact.        Dr. Reeves Forth evaluated pt independently, reviewed imaging, chart, labs. Discussed and formulated plan with the Resident/APP. Changes were made to the note where appropriate. Please see APP/resident note above for details.      This patient is critically ill due to acute stroke with tip of the basilar thrombus and at significant risk of neurological worsening, death form heart failure, respiratory failure, recurrent stroke, bleeding from [facility], seizure, sepsis. This patient's care requires constant monitoring of vital signs, hemodynamics, respiratory and cardiac monitoring, review of multiple databases, neurological assessment, discussion with family, other specialists and medical decision making of high complexity. I spent 35 minutes of  neurocritical care time in the care of this patient.    Deston Bilyeu,MD    To contact Stroke Continuity provider, please refer to [email or web address] After hours, contact General Neurology

## 2022-07-11 NOTE — Progress Notes (Signed)
ANTICOAGULATION CONSULT NOTE - Follow Up Consult  Pharmacy Consult for heparin Indication:  basilar thrombus  Labs: Recent Labs    07/08/22 1448 07/08/22 1738 07/08/22 2225 07/10/22 0538 07/10/22 1521 07/11/22 0247  HGB 15.0  --   --  15.0  --   --   HCT 45.3  --   --  42.5  --   --   PLT 230  --   --  198  --   --   HEPARINUNFRC  --   --    < > 0.67 0.42 0.19*  CREATININE  --  0.65   < > 0.65 0.69 0.68  TROPONINIHS  --  14  --   --   --   --    < > = values in this interval not displayed.    Assessment: 88yo female subtherapeutic on heparin after one level at goal (was high on 600 units/hr and now low on 500 units/hr); no infusion issues or signs of bleeding per RN.  Goal of Therapy:  Heparin level 0.3-0.5 units/ml   Plan:  Will increase heparin infusion by 1 unit/kg/hr to 550 units/hr and check level in 8 hours.    Wynona Neat, PharmD, BCPS  07/11/2022,3:53 AM

## 2022-07-11 NOTE — Progress Notes (Signed)
[place] for heparin Indication:  tip of basilar thrombus, per stroke protocol  Heparin Dosing Weight: 54.8 kg  Labs: Recent Labs    07/08/22 1448 07/08/22 1738 07/10/22 0538 07/10/22 1521 07/11/22 0247 07/11/22 1227  HGB 15.0  --  15.0  --  13.9  --   HCT 45.3  --  42.5  --  40.2  --   PLT 230  --  198  --  113*  --   HEPARINUNFRC  --    < > 0.67 0.42 0.19* 0.32  CREATININE  --    < > 0.65 0.69 0.68  --    < > = values in this interval not displayed.    Assessment: 79 yof with hx afib not on anticoagulation PTA (recently taken off apixaban) due hx of multiple falls presenting with acute basilar artery thrombosis. Pharmacy consulted to start heparin drip per stroke protocol. Patient not on anticoagulation PTA. Acute neuro change 11/21 PM but stat head CT did not show bleed or significant change.  Heparin level in lower end of goal range at 0.32. CBC and SCr WNL. No bleed or issues with infusion reported.  Goal of Therapy:  Heparin level 0.3-0.5 units/ml, no boluses Monitor platelets by anticoagulation protocol: Yes   Plan:  Continue heparin infusion at 550 units/hr Check heparin level with daily labs while on heparin Continue to monitor H&H and platelets    Thank you for allowing pharmacy to be a part of this patient's care.  Ardyth Harps, PharmD Clinical Pharmacist

## 2022-07-11 NOTE — TOC Progression Note (Signed)
Transition of Care [facility]) - Progression Note    Patient Details  Name: Kristen Whitehead MRN: [id number] Date of Birth: 1923-07-05  Transition of Care [facility]) CM/SW [place], [place] Phone Number: 07/11/2022, 9:53 AM  Clinical Narrative:     CSW spoke with pt's daughter Kristen Whitehead, provided current offers and gave Medicare.gov website for ratings information.   Expected Discharge Plan: [place] Barriers to Discharge: Continued Medical Work up, Ship broker, SNF Pending bed offer  Expected Discharge Plan and Services Expected Discharge Plan: [place] In-house Referral: Clinical Social Work   Post Acute Care Choice: [place] Living arrangements for the past 2 months: Single Family Home                                       Social Determinants of Health (SDOH) Interventions    Readmission Risk Interventions     No data to display

## 2022-07-12 DIAGNOSIS — I651 Occlusion and stenosis of basilar artery: Secondary | ICD-10-CM | POA: Diagnosis not present

## 2022-07-12 LAB — TROPONIN I (HIGH SENSITIVITY): Troponin I (High Sensitivity): 8 ng/L (ref <18)

## 2022-07-12 LAB — CBC
HCT: 39.8 % (ref 36.0–46.0)
Hemoglobin: 13.7 g/dL (ref 12.0–15.0)
MCH: 32.6 pg (ref 26.0–34.0)
MCHC: 34.4 g/dL (ref 30.0–36.0)
MCV: 94.8 fL (ref 80.0–100.0)
Platelets: 171 10*3/uL (ref 150–400)
RBC: 4.2 MIL/uL (ref 3.87–5.11)
RDW: 15.8 % — ABNORMAL HIGH (ref 11.5–15.5)
WBC: 6 10*3/uL (ref 4.0–10.5)
nRBC: 0 % (ref 0.0–0.2)

## 2022-07-12 LAB — BASIC METABOLIC PANEL
Anion gap: 11 (ref 5–15)
BUN: 11 mg/dL (ref 8–23)
CO2: 20 mmol/L — ABNORMAL LOW (ref 22–32)
Calcium: 7.1 mg/dL — ABNORMAL LOW (ref 8.9–10.3)
Chloride: 105 mmol/L (ref 98–111)
Creatinine, Ser: 0.73 mg/dL (ref 0.44–1.00)
GFR, Estimated: >60 mL/min (ref >60)
Glucose, Bld: 100 mg/dL — ABNORMAL HIGH (ref 70–99)
Potassium: 3.8 mmol/L (ref 3.5–5.1)
Sodium: 136 mmol/L (ref 135–145)

## 2022-07-12 LAB — HEPARIN LEVEL (UNFRACTIONATED): Heparin Unfractionated: 0.31 IU/mL (ref 0.30–0.70)

## 2022-07-12 NOTE — Progress Notes (Signed)
Occupational Therapy Treatment Patient Details Name: Kristen Whitehead MRN: [id number] DOB: March 06, 1923 Today's Date: 07/12/2022   History of present illness Kristen Whitehead is a 88 y.o. female admitted 07/08/22 with intermittent nausea, generalized weakness, episode of aphasia earlier and diplopia. 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 CT revealed distal basilar thrombus. PMH includes chronic atrial fibrillation who is currently not on anticoagulation due to history of multiple falls, GERD, hypercholesterolemia, hypertension, hypothyroidism, macular degeneration of bilateral eyes with left worse than right who was recently taken off of her Eliquis for multiple falls.   OT comments  Pt progressing towards established OT goals. With decreased visual acuity compared to baseline per her report, but denying blurry or double vision. Will continue to assess. Able to reach for items at sink and apply toothpaste without over or undershooting. Pt performing grooming with up to min A. Pt presents with decreased balance, strength, and activity tolerance. Continue to recommend ST-SNF for continued OT services to optimize safety and independence.    Recommendations for follow up therapy are one component of a multi-disciplinary discharge planning process, led by the attending physician.  Recommendations may be updated based on patient status, additional functional criteria and insurance authorization.    Follow Up Recommendations  Skilled nursing-short term rehab (<3 hours/day)     Assistance Recommended at Discharge Frequent or constant Supervision/Assistance  Patient can return home with the following  A little help with walking and/or transfers;A lot of help with bathing/dressing/bathroom;Assistance with cooking/housework;Direct supervision/assist for medications management;Direct supervision/assist for financial management;Assist for transportation;Help with  stairs or ramp for entrance   Equipment Recommendations  BSC/3in1;Other (comment) (defer to next venue of care)    Recommendations for Other Services      Precautions / Restrictions Precautions Precautions: Fall Precaution Comments: incontinence Restrictions Weight Bearing Restrictions: No       Mobility Bed Mobility Overal bed mobility: Needs Assistance Bed Mobility: Sit to Supine       Sit to supine: Supervision   General bed mobility comments: no physical assist required    Transfers Overall transfer level: Needs assistance Equipment used: Rolling walker (2 wheels) Transfers: Sit to/from Stand Sit to Stand: Min guard           General transfer comment: Min guard A up from chair     Balance Overall balance assessment: Needs assistance Sitting-balance support: Feet supported Sitting balance-Leahy Scale: Fair     Standing balance support: Bilateral upper extremity supported, During functional activity Standing balance-Leahy Scale: Poor Standing balance comment: requires UE support while standing                           ADL either performed or assessed with clinical judgement   ADL Overall ADL's : Needs assistance/impaired     Grooming: Wash/dry hands;Oral care;Min guard;Standing;Minimal assistance Grooming Details (indicate cue type and reason): Pt very hesitant to lift both hands off of sink simultaneously and observed to maintain contact with sink with wrists/elbows throughout session. Min A to open toothpaste, but otherwise min guard A.                 Toilet Transfer: Agricultural engineer (2 wheels) Toilet Transfer Details (indicate cue type and reason): Min guard A for safety         Functional mobility during ADLs: Min guard;Rolling walker (2 wheels) General ADL Comments: decreased strength, balance, high risk of falls, cognition  Extremity/Trunk Assessment Upper Extremity Assessment Upper Extremity Assessment:  Generalized weakness   Lower Extremity Assessment Lower Extremity Assessment: Defer to PT evaluation        Vision   Vision Assessment?: Vision impaired- to be further tested in functional context Additional Comments: Pt reports new decrease in visual acuity, but denies double vision. Has macular degeneration (wet) and glaucoma at baseline.   Perception     Praxis      Cognition Arousal/Alertness: Awake/alert Behavior During Therapy: WFL for tasks assessed/performed Overall Cognitive Status: Impaired/Different from baseline Area of Impairment: Problem solving, Memory, Safety/judgement                     Memory: Decreased short-term memory   Safety/Judgement: Decreased awareness of safety   Problem Solving: Slow processing, Requires verbal cues General Comments: pt pleasant and oriented. Following up to 2 step commands with increased time this session. very conversational.        Exercises      Shoulder Instructions       General Comments      Pertinent Vitals/ Pain       Pain Assessment Pain Assessment: No/denies pain Pain Intervention(s): Monitored during session  Home Living Family/patient expects to be discharged to:: Private residence Living Arrangements: Alone                                      Prior Functioning/Environment              Frequency  Min 2X/week        Progress Toward Goals  OT Goals(current goals can now be found in the care plan section)  Progress towards OT goals: Progressing toward goals  Acute Rehab OT Goals Patient Stated Goal: be safe and get better OT Goal Formulation: With patient Time For Goal Achievement: 07/23/22 Potential to Achieve Goals: Good ADL Goals Pt Will Perform Grooming: with supervision;standing Pt Will Perform Upper Body Dressing: with modified independence;sitting Pt Will Perform Lower Body Dressing: with supervision;sit to/from stand Pt Will Transfer to Toilet: with  supervision;ambulating Pt Will Perform Toileting - Clothing Manipulation and hygiene: with supervision;sit to/from stand Additional ADL Goal #1: Pt will verbalize 3 strategies to reduce risk of falls in home environment with no cues Additional ADL Goal #2: Pt will navigate room environment and find grooming items utilzing compensatory strategies for visual disturbances with no cues at supervision level  Plan Discharge plan remains appropriate;Frequency remains appropriate    Co-evaluation                 AM-PAC OT "6 Clicks" Daily Activity     Outcome Measure   Help from another person eating meals?: A Little Help from another person taking care of personal grooming?: A Little Help from another person toileting, which includes using toliet, bedpan, or urinal?: A Little Help from another person bathing (including washing, rinsing, drying)?: A Little Help from another person to put on and taking off regular upper body clothing?: A Little Help from another person to put on and taking off regular lower body clothing?: A Lot 6 Click Score: 17    End of Session Equipment Utilized During Treatment: Gait belt;Rolling walker (2 wheels)  OT Visit Diagnosis: Unsteadiness on feet (R26.81);Other abnormalities of gait and mobility (R26.89);Repeated falls (R29.6);Muscle weakness (generalized) (M62.81);History of falling (Z91.81);Low vision, both eyes (H54.2);Other symptoms and signs involving the nervous system (  R29.898)   Activity Tolerance Patient tolerated treatment well   Patient Left in bed;with call bell/phone within reach;with bed alarm set   Nurse Communication Mobility status;Precautions        Time: 4383-8184 OT Time Calculation (min): 45 min  Charges: OT General Charges $OT Visit: 1 Visit OT Treatments $Self Care/Home Management : 38-52 mins  Elder Cyphers, OTR/L [facility] Acute Rehabilitation Office: [phone]   Magnus Ivan 07/12/2022, 5:12 PM

## 2022-07-12 NOTE — Discharge Summary (Signed)
Stroke Discharge Summary  Patient ID: Kristen Whitehead   MRN: [id number]      DOB: 16-Mar-1923  Date of Admission: 07/08/2022 Date of Discharge: 07/15/2022  Attending Physician:  Stroke, Md, MD, Stroke MD Consultant(s):    None  Patient's PCP:  [name], NP  DISCHARGE DIAGNOSIS: Small acute strokes in left midbrain and right medial pons, thrombus of basilar tip, likely cardioembolic in setting of atrial fibrillation off anticoagulation Principal Problem:   Basilar artery thrombosis Active Problems:   Atrial fibrillation ([place])   Essential hypertension   Hypothyroidism   Hypercholesterolemia   Allergies as of 07/15/2022       Reactions   Azithromycin Nausea Only, Other (See Comments)   Compazine [prochlorperazine Edisylate] Nausea Only   Demerol Nausea Only   Dolophine [methadone] Nausea Only   Ebastine Nausea Only   EBS   Erythromycin Nausea Only, Other (See Comments)   Gluten Meal Diarrhea   Ibuprofen Nausea Only, Other (See Comments)   Meperidine Hcl Other (See Comments)   Morphine Sulfate Other (See Comments)   Statins Other (See Comments)   myalgias   Tetanus Toxoids Nausea Only   Tetracycline Other (See Comments)   Tetracyclines & Related Nausea Only        Medication List     STOP taking these medications    CALCIUM 600+D PO   furosemide 40 MG tablet Commonly known as: LASIX   MAGNESIUM OXIDE 400 PO   potassium chloride 10 MEQ tablet Commonly known as: KLOR-CON M   PRESERVISION AREDS 2 PO   temazepam 15 MG capsule Commonly known as: RESTORIL   Vitamin D 50 MCG (2000 UT) tablet       TAKE these medications    amLODipine 2.5 MG tablet Commonly known as: NORVASC TAKE 2 TABLETS BY MOUTH  DAILY   apixaban 2.5 MG Tabs tablet Commonly known as: ELIQUIS Take 1 tablet (2.5 mg total) by mouth 2 (two) times daily. What changed: additional instructions   atenolol 25 MG tablet Commonly known as: TENORMIN Take 1 tablet (25 mg total)  by mouth 2 (two) times daily.   dorzolamide 2 % ophthalmic solution Commonly known as: TRUSOPT Place 1 drop into the left eye 2 (two) times daily.   ezetimibe 10 MG tablet Commonly known as: ZETIA Take 1 tablet (10 mg total) by mouth daily. Start taking on: July 16, 2022   latanoprost 0.005 % ophthalmic solution Commonly known as: XALATAN Place 1 drop into both eyes at bedtime.   levothyroxine 75 MCG tablet Commonly known as: SYNTHROID Take 1 tablet (75 mcg total) by mouth daily at 6 (six) AM. Start taking on: July 16, 2022 What changed: when to take this   multivitamin with minerals Tabs tablet Take 1 tablet by mouth daily. Start taking on: July 16, 2022   nitroGLYCERIN 0.4 MG SL tablet Commonly known as: Nitrostat DISSOLVE 1 TABLET UNDER THE TONGUE EVERY 5 MINUTES FOR 3 DOSES AS NEEDED  FOR  CHEST  PAIN What changed:  how much to take how to take this when to take this reasons to take this additional instructions   omeprazole 20 MG capsule Commonly known as: PRILOSEC Take 20 mg by mouth daily as needed (acid reflux).   pramipexole 0.125 MG tablet Commonly known as: MIRAPEX TAKE 1 TABLET BY MOUTH AT  BEDTIME   Premarin vaginal cream Generic drug: conjugated estrogens Place 2 g vaginally 2 (two) times a week. Uses on Sunday and  wednesday   PROBIOTIC DAILY PO Take 1 tablet by mouth daily.   sodium chloride 5 % ophthalmic solution Commonly known as: MURO 128 Place 1 drop into the right eye at bedtime.        LABORATORY STUDIES CBC    Component Value Date/Time   WBC 6.9 07/15/2022 0333   RBC 3.94 07/15/2022 0333   HGB 13.0 07/15/2022 0333   HGB 14.2 01/16/2018 1427   HCT 37.4 07/15/2022 0333   HCT 43.9 01/16/2018 1427   PLT 168 07/15/2022 0333   PLT 238 01/16/2018 1427   MCV 94.9 07/15/2022 0333   MCV 95 01/16/2018 1427   MCH 33.0 07/15/2022 0333   MCHC 34.8 07/15/2022 0333   RDW 15.6 (H) 07/15/2022 0333   RDW 14.9 01/16/2018 1427    LYMPHSABS 2.2 07/08/2022 1448   LYMPHSABS 2.2 01/16/2018 1427   MONOABS 0.5 07/08/2022 1448   EOSABS 0.1 07/08/2022 1448   EOSABS 0.1 01/16/2018 1427   BASOSABS 0.0 07/08/2022 1448   BASOSABS 0.0 01/16/2018 1427   CMP    Component Value Date/Time   NA 136 07/15/2022 0333   NA 140 01/27/2020 1211   K 4.1 07/15/2022 0333   CL 104 07/15/2022 0333   CO2 24 07/15/2022 0333   GLUCOSE 120 (H) 07/15/2022 0333   BUN 16 07/15/2022 0333   BUN 11 01/27/2020 1211   CREATININE 0.77 07/15/2022 0333   CREATININE 0.79 06/24/2022 1624   CALCIUM 8.1 (L) 07/15/2022 0333   PROT 6.3 01/31/2022 1051   PROT 6.5 01/16/2018 1427   ALBUMIN 4.1 12/09/2019 1907   ALBUMIN 4.0 01/16/2018 1427   AST 23 01/31/2022 1051   ALT 15 01/31/2022 1051   ALKPHOS 134 (H) 12/09/2019 1907   BILITOT 0.5 01/31/2022 1051   BILITOT 1.0 01/16/2018 1427   GFRNONAA >60 07/15/2022 0333   GFRAA 70 01/27/2020 1211   COAGS Lab Results  Component Value Date   INR 1.3 (A) 04/28/2019   INR 4.6 (A) 04/23/2019   INR 3.2 (A) 04/12/2019   Lipid Panel    Component Value Date/Time   CHOL 207 (H) 07/09/2022 0332   TRIG 84 07/09/2022 0332   HDL 82 07/09/2022 0332   CHOLHDL 2.5 07/09/2022 0332   VLDL 17 07/09/2022 0332   LDLCALC 108 (H) 07/09/2022 0332   HgbA1C  Lab Results  Component Value Date   HGBA1C 5.8 (H) 07/09/2022   Urinalysis    Component Value Date/Time   COLORURINE YELLOW 07/08/2022 1631   APPEARANCEUR HAZY (A) 07/08/2022 1631   LABSPEC 1.006 07/08/2022 1631   PHURINE 8.0 07/08/2022 1631   GLUCOSEU NEGATIVE 07/08/2022 1631   GLUCOSEU NEGATIVE 06/01/2012 1228   HGBUR NEGATIVE 07/08/2022 1631   BILIRUBINUR NEGATIVE 07/08/2022 1631   BILIRUBINUR neg 03/21/2013 1703   KETONESUR NEGATIVE 07/08/2022 1631   PROTEINUR NEGATIVE 07/08/2022 1631   UROBILINOGEN 0.2 11/10/2014 2144   NITRITE NEGATIVE 07/08/2022 1631   LEUKOCYTESUR NEGATIVE 07/08/2022 1631   Urine Drug Screen No results found for: "LABOPIA",  "COCAINSCRNUR", "LABBENZ", "AMPHETMU", "THCU", "LABBARB"  Alcohol Level    Component Value Date/Time   ETH <10 12/09/2019 1907     SIGNIFICANT DIAGNOSTIC STUDIES CT HEAD CODE STROKE WO CONTRAST  Result Date: 07/09/2022 CLINICAL DATA:  Code stroke.  Acute neuro deficit.  Rule out stroke. EXAM: CT HEAD WITHOUT CONTRAST TECHNIQUE: Contiguous axial images were obtained from the base of the skull through the vertex without intravenous contrast. RADIATION DOSE REDUCTION: This exam was performed according to  the departmental dose-optimization program which includes automated exposure control, adjustment of the mA and/or kV according to patient size and/or use of iterative reconstruction technique. COMPARISON:  CT head 07/08/2022.  MRI head 07/08/2022 FINDINGS: Brain: Generalized atrophy. Mild white matter hypodensity bilaterally unchanged. Negative for acute infarct, hemorrhage, or mass. Small acute infarcts in the brainstem undo TBI yesterday are not identified by CT. Vascular: Negative for hyperdense vessel Skull: Negative Sinuses/Orbits: Mild mucosal edema right maxillary sinus. Remaining sinuses clear. Bilateral cataract extraction Other: None ASPECTS ([place] Stroke Program Early CT Score) - Ganglionic level infarction (caudate, lentiform nuclei, internal capsule, insula, M1-M3 cortex): 7 - Supraganglionic infarction (M4-M6 cortex): 3 Total score (0-10 with 10 being normal): 10 IMPRESSION: 1. No acute abnormality. Atrophy and mild chronic microvascular ischemia. 2. Aspects is 10. 3. These results were called by telephone at the time of interpretation on 07/09/2022 at 5:40 pm to provider [facility] , who verbally acknowledged these results. Electronically Signed   By: Franchot Gallo M.D.   On: 07/09/2022 17:41   ECHOCARDIOGRAM COMPLETE  Result Date: 07/09/2022    ECHOCARDIOGRAM REPORT   Patient Name:   YENNY KOSA Date of Exam: 07/09/2022 Medical Rec #:  [id number]           Height:        59.0 in Accession #:    [id number]          Weight:       125.7 lb Date of Birth:  1923/02/25           BSA:          1.514 m Patient Age:    88 years            BP:           139/86 mmHg Patient Gender: F                   HR:           59 bpm. Exam Location:  Inpatient Procedure: 2D Echo, Color Doppler and Cardiac Doppler Indications:    Stroke i63.9  History:        Patient has prior history of Echocardiogram examinations, most                 recent 12/11/2019. CHF, Arrythmias:Atrial Fibrillation; Risk                 Factors:Hypertension and Dyslipidemia.  Sonographer:    Raquel Sarna Senior RDCS Referring Phys: [id number] [place]  1. Left ventricular ejection fraction, by estimation, is 60 to 65%. The left ventricle has normal function. The left ventricle has no regional wall motion abnormalities. Left ventricular diastolic parameters are indeterminate.  2. Right ventricular systolic function is normal. The right ventricular size is normal. There is normal pulmonary artery systolic pressure. The estimated right ventricular systolic pressure is 24.2 mmHg.  3. Left atrial size was moderately dilated.  4. Right atrial size was mildly dilated.  5. There is no evidence of cardiac tamponade.  6. 2D MVA 3.68 cm2.. The mitral valve is abnormal. Trivial mitral valve regurgitation. Severe mitral annular calcification.  7. The aortic valve was not well visualized. Aortic valve regurgitation is trivial. FINDINGS  Left Ventricle: Left ventricular ejection fraction, by estimation, is 60 to 65%. The left ventricle has normal function. The left ventricle has no regional wall motion abnormalities. The left ventricular internal cavity size was small. There is no left ventricular hypertrophy. Left ventricular  diastolic parameters are indeterminate. Right Ventricle: The right ventricular size is normal. No increase in right ventricular wall thickness. Right ventricular systolic function is normal. There is normal  pulmonary artery systolic pressure. The tricuspid regurgitant velocity is 2.86 m/s, and  with an assumed right atrial pressure of 3 mmHg, the estimated right ventricular systolic pressure is 67.1 mmHg. Left Atrium: Left atrial size was moderately dilated. Right Atrium: Right atrial size was mildly dilated. Pericardium: Trivial pericardial effusion is present. The pericardial effusion is circumferential. There is no evidence of cardiac tamponade. Mitral Valve: 2D MVA 3.68 cm2. The mitral valve is abnormal. Severe mitral annular calcification. Trivial mitral valve regurgitation. Tricuspid Valve: The tricuspid valve is normal in structure. Tricuspid valve regurgitation is mild . No evidence of tricuspid stenosis. Aortic Valve: The aortic valve was not well visualized. Aortic valve regurgitation is trivial. Pulmonic Valve: The pulmonic valve was grossly normal. Pulmonic valve regurgitation is trivial. Aorta: The aortic root is normal in size and structure. IAS/Shunts: No atrial level shunt detected by color flow Doppler.  LEFT VENTRICLE PLAX 2D LVIDd:         3.40 cm   Diastology LVIDs:         2.50 cm   LV e' medial:    6.31 cm/s LV PW:         0.90 cm   LV E/e' medial:  16.6 LV IVS:        1.00 cm   LV e' lateral:   9.46 cm/s LVOT diam:     2.00 cm   LV E/e' lateral: 11.1 LV SV:         67 LV SV Index:   44 LVOT Area:     3.14 cm  RIGHT VENTRICLE RV S prime:     10.20 cm/s TAPSE (M-mode): 1.2 cm LEFT ATRIUM             Index        RIGHT ATRIUM           Index LA diam:        3.60 cm 2.38 cm/m   RA Area:     20.20 cm LA Vol (A2C):   64.5 ml 42.61 ml/m  RA Volume:   51.20 ml  33.82 ml/m LA Vol (A4C):   61.1 ml 40.36 ml/m LA Biplane Vol: 63.9 ml 42.21 ml/m  AORTIC VALVE LVOT Vmax:   93.30 cm/s LVOT Vmean:  66.700 cm/s LVOT VTI:    0.213 m  AORTA Ao Root diam: 2.90 cm MITRAL VALVE                TRICUSPID VALVE MV Area (PHT): 5.23 cm     TR Peak grad:   32.7 mmHg MV Decel Time: 145 msec     TR Vmax:        286.00  cm/s MV E velocity: 105.00 cm/s MV A velocity: 31.60 cm/s   SHUNTS MV E/A ratio:  3.32         Systemic VTI:  0.21 m                             Systemic Diam: 2.00 cm Rudean Haskell MD Electronically signed by Rudean Haskell MD Signature Date/Time: 07/09/2022/9:46:21 AM    Final    MR BRAIN WO CONTRAST  Result Date: 07/08/2022 CLINICAL DATA:  Stroke suspected EXAM: MRI HEAD WITHOUT CONTRAST TECHNIQUE: Multiplanar, multiecho pulse sequences of the brain  and surrounding structures were obtained without intravenous contrast. COMPARISON:  No prior MRI, correlation is made with CTA head neck 07/08/2022 FINDINGS: Brain: Restricted diffusion with ADC correlate in the left midbrain (series 5, image [id number]), with additional small focus of restricted diffusion with ADC correlate in the right medial pons (series 5, image 71), consistent with acute infarcts. These are not associated with significant T2 hyperintense signal at this time. No acute hemorrhage, mass, mass effect, or midline shift. No hydrocephalus or extra-axial collection. T2 hyperintense signal in the periventricular white matter, likely the sequela of mild-to-moderate chronic small vessel ischemic disease. Remote lacunar infarcts in the right frontal and left parietal lobes. Cerebral volume is better than expected for age. No hemosiderin deposition to suggest remote hemorrhage. Vascular: Loss of the distal basilar artery flow void (series 11, image 11), which is consistent with the occlusion seen on the same-day CTA. Otherwise normal arterial flow voids. Skull and upper cervical spine: Normal marrow signal. Sinuses/Orbits: Clear paranasal sinuses. Other: Fluid in left mastoid air cells. IMPRESSION: 1. Small acute infarcts in the left midbrain and right medial pons. 2. Loss of the distal basilar artery flow void, consistent with the occlusion seen on the same-day CTA. Imaging results were communicated on 07/08/2022 at 11:45 pm to provider Dr.  Lorrin Goodell via secure text paging. Electronically Signed   By: Merilyn Baba M.D.   On: 07/08/2022 23:45   CT ANGIO HEAD NECK W WO CM  Result Date: 07/08/2022 CLINICAL DATA:  [place]iplopia; atrial fibrillation EXAM: CT ANGIOGRAPHY HEAD AND NECK TECHNIQUE: Multidetector CT imaging of the head and neck was performed using the standard protocol during bolus administration of intravenous contrast. Multiplanar CT image reconstructions and MIPs were obtained to evaluate the vascular anatomy. Carotid stenosis measurements (when applicable) are obtained utilizing NASCET criteria, using the distal internal carotid diameter as the denominator. RADIATION DOSE REDUCTION: This exam was performed according to the departmental dose-optimization program which includes automated exposure control, adjustment of the mA and/or kV according to patient size and/or use of iterative reconstruction technique. CONTRAST:  68m OMNIPAQUE IOHEXOL 350 MG/ML SOLN COMPARISON:  06/11/2017 CT head, CTA head neck 12/30/2008 FINDINGS: CT HEAD FINDINGS Brain: No evidence of acute infarct, hemorrhage, mass, mass effect, or midline shift. No hydrocephalus or extra-axial fluid collection. Periventricular white matter changes, likely the sequela of chronic small vessel ischemic disease. Vascular: No hyperdense vessel. Skull: Normal. Negative for fracture or focal lesion. Sinuses/Orbits: No acute finding. Status post bilateral lens replacements. Other: The mastoid air cells are well aerated. CTA NECK FINDINGS Aortic arch: Standard branching. Imaged portion shows no evidence of aneurysm or dissection. 45-50% stenosis in the proximal left subclavian artery (series 13, image 292). Aortic atherosclerosis. Right carotid system: No evidence of dissection, occlusion, or hemodynamically significant stenosis (greater than 50%). Atherosclerotic disease at the bifurcation and in the proximal ICA is not hemodynamically significant. Left carotid system: No evidence of  dissection, occlusion, or hemodynamically significant stenosis (greater than 50%). Atherosclerotic disease at the bifurcation and in the proximal ICA is not hemodynamically significant. Vertebral arteries: No evidence of dissection, occlusion, or hemodynamically significant stenosis (greater than 50%). Skeleton: No acute osseous abnormality. Degenerative changes in the cervical spine. Other neck: No acute finding. Upper chest: Mosaic attenuation, consistent with air trapping. Calcified lesion near the right apex, likely sequela of prior granulomatous disease. No other focal pulmonary opacity or pleural effusion. Review of the MIP images confirms the above findings CTA HEAD FINDINGS Anterior circulation: Both internal carotid arteries are  patent to the termini, with mild calcifications but without significant stenosis. A1 segments patent. Normal anterior communicating artery. Anterior cerebral arteries are patent to their distal aspects. No M1 stenosis or occlusion. MCA branches perfused and symmetric. Posterior circulation: Vertebral arteries patent to the vertebrobasilar junction without stenosis. Posterior inferior cerebellar arteries patent proximally. The basilar artery is occluded distally (series 14, image 94), likely thrombosed. The right superior cerebellar artery is not opacified. The bilateral PCAs and left superior cerebellar artery are opacified, secondary to a patent left posterior communicating artery. Patent P1 segments. PCAs perfused to their distal aspects without stenosis. The right posterior communicating artery is not visualized. Venous sinuses: As permitted by contrast timing, patent. Anatomic variants: None significant. Review of the MIP images confirms the above findings IMPRESSION: 1. 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 The right superior cerebellar artery is not opacified. The bilateral PCAs and left superior cerebellar artery are opacified, secondary to a  patent left posterior communicating artery. 2. 45-50% stenosis in the proximal left subclavian artery. No additional hemodynamically significant stenosis in the neck. 3. No additional acute intracranial process. 4. Aortic atherosclerosis. Aortic Atherosclerosis (ICD10-I70.0). These results were called by telephone at the time of interpretation on 07/08/2022 at 8:10 pm to provider Wynona Dove , who verbally acknowledged these results. Electronically Signed   By: Merilyn Baba M.D.   On: 07/08/2022 20:10   DG Chest Port 1 View  Result Date: 07/08/2022 CLINICAL DATA:  AFib EXAM: PORTABLE CHEST 1 VIEW COMPARISON:  Chest x-ray dated February 03, 2020 FINDINGS: Cardiac and mediastinal contours are unchanged. New nodular opacity of the right lower lung. Lungs otherwise clear. No evidence of pleural effusion or pneumothorax. IMPRESSION: New indeterminate nodular opacity of the right lower lung. Recommend chest CT for further evaluation, which can be performed non emergently. Electronically Signed   By: Yetta Glassman M.D.   On: 07/08/2022 15:16      HISTORY OF PRESENT ILLNESS Patient with a history of A-fib without anticoagulation, GERD, hyperlipidemia, hypertension, hypothyroidism and macular degeneration presented with dizziness, nausea and generalized weakness.  HOSPITAL COURSE Patient was found to have a basilar tip thrombus, but was not taken to thrombectomy due to lack of symptoms, however she was placed on a heparin drip with careful observation.  Small acute infarcts were noted on MRI in the left midbrain and right medial pons, likely caused by atrial fibrillation off anticoagulation.  Atrial fibrillation continued throughout hospitalization.  Stroke: Small acute infarcts in left midbrain and right medial pons Thrombus of basilar tip Etiology: Likely cardioembolic in setting of atrial fibrillation off anticoagulation CT head No acute abnormality. Small vessel disease.  CTA head & neck occlusion of  distal basilar artery MRI small acute infarcts in left midbrain and right medial pons 2D Echo EF 60 to 65%, moderately dilated left atrium, no atrial level shunt LDL 108 HgbA1c 5.8       Diet    Diet regular Room service appropriate? Yes; Fluid consistency: Thin        No antithrombotic prior to admission, transitioned to eliquis.  Therapy recommendations:  SNF Disposition: SNF   Hypertension Home meds: None Stable Amlodipine and Atenolol added   Hyperlipidemia Home meds: None LDL 108, goal < 70 Add ezetimibe 10 mg daily High intensity statin not indicated due to previous intolerance   Atrial fibrillation Patient has history of atrial fibrillation, not on anticoagulation due to frequent falls Eliquis 2.68m BID added due to basilar tip thrombus,  as benefit outweighs risk at this time.    Other Stroke Risk Factors Advanced Age >/= 57    DISCHARGE EXAM Blood pressure 103/65, pulse 79, temperature 98.6 F (37 C), temperature source Oral, resp. rate 16, height '4\' 11"'$  (1.499 m), weight 57 kg, SpO2 96 %. Patient seen in room. Well-developed, elderly patient in no acute distress. Aox4, no aphasia/dysarthria. Follows commands and moves all extremities equally with intact sensation bilaterally. C/o slight headache and fatigue after working with PT/OT.   Discharge Diet       Diet   Diet regular Room service appropriate? Yes; Fluid consistency: Thin   liquids  DISCHARGE PLAN Disposition: SNF Eliquis 2.'5mg'$  BID or secondary stroke prevention . Ongoing stroke risk factor control by Primary Care Physician at time of discharge Follow-up PCP [place], Amy E, NP in 2 weeks. Follow-up in [facility] in 8 weeks, office to schedule an appointment.   45 minutes were spent preparing discharge.  Vivi Ferns, DNP, AGACNP-BC [facility] Pager: [phone]

## 2022-07-12 NOTE — TOC Progression Note (Addendum)
Transition of Care [facility]) - Progression Note    Patient Details  Name: Kristen Whitehead MRN: [id number] Date of Birth: 02/22/1923  Transition of Care [facility]) CM/SW [place], [place] Phone Number: 07/12/2022, 2:21 PM  Clinical Narrative:    CSW spoke with dtr who noted they are still reviewing bed offers. Dtr states she will call back with their choice. Auth will need to be started. TOC is continuing to follow.  2:50- Pt's daughter has chosen [facility], facility notified. Insurance will need to be started when a new PT note is in.  Expected Discharge Plan: [place] Barriers to Discharge: Continued Medical Work up, Ship broker, SNF Pending bed offer  Expected Discharge Plan and Services Expected Discharge Plan: [place] In-house Referral: Clinical Social Work   Post Acute Care Choice: [place] Living arrangements for the past 2 months: Single Family Home                                       Social Determinants of Health (SDOH) Interventions    Readmission Risk Interventions     No data to display

## 2022-07-12 NOTE — Progress Notes (Signed)
Physical Therapy Treatment Patient Details Name: Kristen Whitehead MRN: [id number] DOB: [birth year] Today's Date: 07/12/2022   History of Present Illness Kristen Whitehead is a 88 y.o. female admitted 07/08/22 with intermittent nausea, generalized weakness, episode of aphasia earlier and diplopia. 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 CT revealed distal basilar thrombus. PMH includes chronic atrial fibrillation who is currently not on anticoagulation due to history of multiple falls, GERD, hypercholesterolemia, hypertension, hypothyroidism, macular degeneration of bilateral eyes with left worse than right who was recently taken off of her Eliquis for multiple falls.    PT Comments    Patient progressing with in room ambulation to bathroom and to recliner around the bed.  Fatigued after straining to have BM.  She reports h/o constipation.  Needing assistance for safety due to imbalance and continued weakness. PT will continue to follow acutely.  Recommend SNF level rehab at d/c.    Recommendations for follow up therapy are one component of a multi-disciplinary discharge planning process, led by the attending physician.  Recommendations may be updated based on patient status, additional functional criteria and insurance authorization.  Follow Up Recommendations  Skilled nursing-short term rehab (<3 hours/day) Can patient physically be transported by private vehicle: Yes   Assistance Recommended at Discharge Frequent or constant Supervision/Assistance  Patient can return home with the following A little help with walking and/or transfers;A little help with bathing/dressing/bathroom;Help with stairs or ramp for entrance;Assist for transportation;Assistance with cooking/housework;Direct supervision/assist for medications management   Equipment Recommendations  None recommended by PT    Recommendations for Other Services       Precautions /  Restrictions Precautions Precautions: Fall Precaution Comments: incontinence     Mobility  Bed Mobility Overal bed mobility: Needs Assistance Bed Mobility: Supine to Sit     Supine to sit: HOB elevated, Min assist     General bed mobility comments: assist for lifting trunk and increased time to scoot hips    Transfers Overall transfer level: Needs assistance Equipment used: Rolling walker (2 wheels) Transfers: Sit to/from Stand Sit to Stand: Min assist           General transfer comment: up to RW from EOB then from toilet in bathroom    Ambulation/Gait Ambulation/Gait assistance: Min assist Gait Distance (Feet): 12 Feet (& 15') Assistive device: Rolling walker (2 wheels) Gait Pattern/deviations: Step-to pattern, Step-through pattern, Wide base of support       General Gait Details: to bathroom then around bed to reclinerheavily reliant on RW and at times wobbling back and forth while holding RW and pt stopping till improved prior to continuing to walk   Stairs             Wheelchair Mobility    Modified Rankin (Stroke Patients Only) Modified Rankin (Stroke Patients Only) Pre-Morbid Rankin Score: Slight disability Modified Rankin: Moderately severe disability     Balance Overall balance assessment: Needs assistance Sitting-balance support: Feet supported Sitting balance-Leahy Scale: Fair     Standing balance support: Bilateral upper extremity supported, During functional activity Standing balance-Leahy Scale: Poor Standing balance comment: Toileted in bathroom with increased time trying to have BM; performed some seated perineal hygiene then assisted in standing to complete, pt washing hands at sink leaning on her elbows for balance                            Cognition Arousal/Alertness: Awake/alert Behavior During  Therapy: WFL for tasks assessed/performed Overall Cognitive Status: Impaired/Different from baseline Area of Impairment:  Problem solving, Memory, Safety/judgement                     Memory: Decreased short-term memory       Problem Solving: Slow processing, Requires verbal cues          Exercises      General Comments        Pertinent Vitals/Pain Pain Assessment Faces Pain Scale: Hurts little more Pain Location: R ribs at times during attempts at having BM Pain Descriptors / Indicators: Discomfort, Grimacing, Guarding Pain Intervention(s): Monitored during session, Repositioned    Home Living                          Prior Function            PT Goals (current goals can now be found in the care plan section) Progress towards PT goals: Progressing toward goals    Frequency    Min 3X/week      PT Plan Current plan remains appropriate    Co-evaluation              AM-PAC PT "6 Clicks" Mobility   Outcome Measure  Help needed turning from your back to your side while in a flat bed without using bedrails?: A Lot Help needed moving from lying on your back to sitting on the side of a flat bed without using bedrails?: A Lot Help needed moving to and from a bed to a chair (including a wheelchair)?: A Lot Help needed standing up from a chair using your arms (e.g., wheelchair or bedside chair)?: A Lot Help needed to walk in hospital room?: Total Help needed climbing 3-5 steps with a railing? : Total 6 Click Score: 10    End of Session Equipment Utilized During Treatment: Gait belt Activity Tolerance: Patient limited by fatigue Patient left: in chair;with call bell/phone within reach;with nursing/sitter in room   PT Visit Diagnosis: Other abnormalities of gait and mobility (R26.89);Other symptoms and signs involving the nervous system (R29.898);Muscle weakness (generalized) (M62.81);History of falling (Z91.81)     Time: 8270-7867 PT Time Calculation (min) (ACUTE ONLY): 55 min  Charges:  $Gait Training: 23-37 mins $Therapeutic Activity: 23-37 mins                      Kristen Whitehead, PT Acute Rehabilitation Services Office:[phone] 07/12/2022    Kristen Whitehead 07/12/2022, 4:43 PM

## 2022-07-12 NOTE — Progress Notes (Signed)
[facility] for heparin Indication:  tip of basilar thrombus, per stroke protocol  Heparin Dosing Weight: 54.8 kg  Labs: Recent Labs    07/10/22 0538 07/10/22 1521 07/11/22 0247 07/11/22 1227 07/12/22 0239  HGB 15.0  --  13.9  --  13.7  HCT 42.5  --  40.2  --  39.8  PLT 198  --  113*  --  171  HEPARINUNFRC 0.67 0.42 0.19* 0.32 0.31  CREATININE 0.65 0.69 0.68  --  0.73     Assessment: 57 yof with hx afib not on anticoagulation PTA (recently taken off apixaban) due hx of multiple falls presenting with acute basilar artery thrombosis. Pharmacy consulted to start heparin drip per stroke protocol. Patient not on anticoagulation PTA. Acute neuro change 11/21 PM but stat head CT did not show bleed or significant change.  Heparin level in lower end of goal range at 0.31. CBC and SCr WNL. No bleeding or issues with infusion reported.  Goal of Therapy:  Heparin level 0.3-0.5 units/ml, no boluses Monitor platelets by anticoagulation protocol: Yes   Plan:  Continue heparin infusion at 550 units/hr Check heparin level with daily labs while on heparin Continue to monitor H&H and platelets   Thank you for allowing pharmacy to be a part of this patient's care.  Vicenta Dunning, PharmD  PGY1 Pharmacy Resident

## 2022-07-12 NOTE — Progress Notes (Addendum)
STROKE TEAM PROGRESS NOTE   INTERVAL HISTORY Patient is seen in her room with no family at the bedside.  She complains of some hip and chest pain overnight.  Will obtain troponin and EKG.  Plan to transition to Eliquis tomorrow, pending further discussion with patient and family.  We will continue heparin IV today.  Vitals:   07/11/22 1950 07/12/22 0334 07/12/22 0721 07/12/22 1121  BP: 138/88 (!) 142/66 123/64 (!) 135/56  Pulse: 69 70 66 65  Resp: '16  17 18  '$ Temp: 98.2 F (36.8 C) 98.6 F (37 C) 98.3 F (36.8 C) 97.7 F (36.5 C)  TempSrc: Oral Oral Oral Oral  SpO2: 99% 94% 95% 95%  Weight:      Height:       CBC:  Recent Labs  Lab 07/08/22 1448 07/10/22 0538 07/11/22 0247 07/12/22 0239  WBC 7.3   < > 8.0 6.0  NEUTROABS 4.5  --   --   --   HGB 15.0   < > 13.9 13.7  HCT 45.3   < > 40.2 39.8  MCV 97.2   < > 94.8 94.8  PLT 230   < > 113* 171   < > = values in this interval not displayed.    Basic Metabolic Panel:  Recent Labs  Lab 07/08/22 1738 07/08/22 2225 07/11/22 0247 07/12/22 0239  NA 139   < > 140 136  K 3.5   < > 5.0 3.8  CL 106   < > 109 105  CO2 27   < > 21* 20*  GLUCOSE 125*   < > 88 100*  BUN 9   < > 13 11  CREATININE 0.65   < > 0.68 0.73  CALCIUM 7.6*   < > 6.8* 7.1*  MG 1.8  --   --   --    < > = values in this interval not displayed.    Lipid Panel:  Recent Labs  Lab 07/09/22 0332  CHOL 207*  TRIG 84  HDL 82  CHOLHDL 2.5  VLDL 17  LDLCALC 108*    HgbA1c:  Recent Labs  Lab 07/09/22 0332  HGBA1C 5.8*    Urine Drug Screen: No results for input(s): "LABOPIA", "COCAINSCRNUR", "LABBENZ", "AMPHETMU", "THCU", "LABBARB" in the last 168 hours.  Alcohol Level No results for input(s): "ETH" in the last 168 hours.  IMAGING past 24 hours No results found.  PHYSICAL EXAM General: Alert, well-developed, well-nourished elderly patient in no acute distress Respiratory: Regular, unlabored respirations on room air  NEURO:  Mental Status:  AA&Ox3  Speech/Language: speech is without dysarthria or aphasia.  Naming, fluency, and comprehension intact.  Cranial Nerves:  II: Right pupil 4 mm and sluggish her baseline due to macular degeneration, left pupil 3 mm and briskly reactive III, IV, VI: EOMI.  V: Sensation is intact to light touch and symmetrical to face.  VII: Smile is symmetrical.   VIII: hearing intact to voice. IX, X: Phonation is normal.  XII: tongue is midline without fasciculations. Motor: No drift in upper or lower extremities. Having some right leg/hip pain when attempting to move. No signs of bruising/swelling. Tone: is normal and bulk is normal Sensation- Intact to light touch bilaterally.   Coordination: No ataxia Gait- deferred    ASSESSMENT/PLAN Kristen Whitehead is a 88 y.o. female with history of atrial fibrillation not on anticoagulation due to falls, GERD, hyperlipidemia, hypertension, hypothyroidism and macular degeneration of bilateral eyes worse on the right with  dizziness, nausea and generalized weakness.  She was found to have a thrombus at the tip of the basilar artery, but due to lack of symptoms thrombectomy was deferred and patient was placed on heparin drip with careful observation in the ICU.  Episodes of A-fib with RVR improved with addition of atenolol home dose.    Stroke: Small acute infarcts in left midbrain and right medial pons Thrombus of basilar tip Etiology: Likely cardioembolic in setting of atrial fibrillation off anticoagulation CT head No acute abnormality. Small vessel disease.  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 Yes; Fluid consistency: Thin   No antithrombotic prior to admission, now on heparin  IV.  Therapy recommendations:  SNF Disposition: Pending  Hypertension Home meds: None Stable Permissive hypertension (OK if < 220/120) but gradually normalize in 5-7 days Long-term BP goal normotensive  Hyperlipidemia Home meds: None LDL 108, goal < 70 Add ezetimibe 10 mg daily High intensity statin not indicated due to previous intolerance Continue statin at discharge  Atrial fibrillation Patient has history of atrial fibrillation, not on anticoagulation due to frequent falls  heparin IV as inpatient due to basilar tip thrombus  Other Stroke Risk Factors Advanced Age >/= 86   Other Active Problems None  Hospital day # 4  Patient seen and examined by NP/APP with MD. MD to update note as needed.   Kristen Whitehead , MSN, AGACNP-BC [facility] See Amion for schedule and pager information 07/12/2022 11:57 AM   ATTENDING ATTESTATION:   88 year old with overall amazing baseline with acute left midbrain right pontine CVA in The basilar thrombus on heparin drip low-dose.  Overall appears to be stable.  Her exam unchanged.  Right pupil sluggish but that is her baseline for her macular degeneration.  Face symmetric.  Answering all questions.  No weakness on exam. Monitor right hip pain. Sensation is intact. On the a floor bed today. Complained of CP. EKG and troponins neg.    Plan to heparinize for 5 to 7 days then consider transitioning to oral anticoagulation.  Pt is in agreement with [facility] and can transition tomorrow.    Dr. Reeves Whitehead evaluated pt independently, reviewed imaging, chart, labs. Discussed and formulated plan with the Resident/APP. Changes were made to the note where appropriate. Please see APP/resident note above for details.        Kristen Mckellips,MD     To contact Stroke Continuity provider, please refer to [email or web address] After hours, contact General Neurology

## 2022-07-13 DIAGNOSIS — I651 Occlusion and stenosis of basilar artery: Secondary | ICD-10-CM | POA: Diagnosis not present

## 2022-07-13 LAB — CBC
HCT: 40.5 % (ref 36.0–46.0)
Hemoglobin: 13.9 g/dL (ref 12.0–15.0)
MCH: 32.6 pg (ref 26.0–34.0)
MCHC: 34.3 g/dL (ref 30.0–36.0)
MCV: 94.8 fL (ref 80.0–100.0)
Platelets: 198 10*3/uL (ref 150–400)
RBC: 4.27 MIL/uL (ref 3.87–5.11)
RDW: 15.6 % — ABNORMAL HIGH (ref 11.5–15.5)
WBC: 6.4 10*3/uL (ref 4.0–10.5)
nRBC: 0 % (ref 0.0–0.2)

## 2022-07-13 LAB — HEPARIN LEVEL (UNFRACTIONATED): Heparin Unfractionated: 0.32 IU/mL (ref 0.30–0.70)

## 2022-07-13 LAB — BASIC METABOLIC PANEL
Anion gap: 8 (ref 5–15)
BUN: 7 mg/dL — ABNORMAL LOW (ref 8–23)
CO2: 24 mmol/L (ref 22–32)
Calcium: 7.5 mg/dL — ABNORMAL LOW (ref 8.9–10.3)
Chloride: 106 mmol/L (ref 98–111)
Creatinine, Ser: 0.73 mg/dL (ref 0.44–1.00)
GFR, Estimated: >60 mL/min (ref >60)
Glucose, Bld: 93 mg/dL (ref 70–99)
Potassium: 3.9 mmol/L (ref 3.5–5.1)
Sodium: 138 mmol/L (ref 135–145)

## 2022-07-13 MED ORDER — APIXABAN 2.5 MG PO TABS
2.5000 mg | ORAL_TABLET | Freq: Two times a day (BID) | ORAL | Status: DC
  Administered 2022-07-13 – 2022-07-16 (×7): 2.5 mg via ORAL
  Filled 2022-07-13 (×7): qty 1

## 2022-07-13 NOTE — Progress Notes (Addendum)
STROKE TEAM PROGRESS NOTE   ATTENDING NOTE: I reviewed above note and agree with the assessment and plan. Pt was seen and examined.   88 year old female with history of A-fib not on AC since 07/05/2022 due to fall, hypertension, hyperlipidemia admitted for dizziness, nausea, generalized weakness.  CT no acute abnormality.  CT head and neck showed distal basilar artery occlusion due to thrombus, bilateral PCA and left SCA supplied from left P-comm.  MRI showed small right pontine and left midbrain infarcts.  EF 60 to 65%.  LDL 108, A1c 5.8.  Creatinine 0.73.  On exam, patient awake alert, no significant focal neurologic deficit.  No aphasia follows simple commands.  Etiology for patient stroke likely due to A-fib not on [facility].  She is on heparin IV and will transition to Eliquis 2.5 mg twice daily today.  Continue atenolol twice daily for rate control.  Has statin intolerance, put on Zetia.  PT/OT recommend SNF.  For detailed assessment and plan, please refer to above/below as I have made changes wherever appropriate.   Rosalin Hawking, MD PhD Stroke Neurology 07/13/2022 9:39 PM    INTERVAL HISTORY Patient is seen in her room with no family at the bedside.  She denies hip and chest pain today, but states she has a mild headache which is relieved with acetaminophen.  Will transition from IV heparin to Eliquis today.  Vitals:   07/12/22 2036 07/12/22 2353 07/13/22 0512 07/13/22 0820  BP: 133/87 (!) 137/91 132/65 (!) 125/93  Pulse: 76   76  Resp: '18 18 18 16  '$ Temp: 98.9 F (37.2 C) 98.7 F (37.1 C) 98.8 F (37.1 C) 98.4 F (36.9 C)  TempSrc: Oral Oral Oral Oral  SpO2: 93% 97% 95% 95%  Weight:      Height:       CBC:  Recent Labs  Lab 07/08/22 1448 07/10/22 0538 07/12/22 0239 07/13/22 0325  WBC 7.3   < > 6.0 6.4  NEUTROABS 4.5  --   --   --   HGB 15.0   < > 13.7 13.9  HCT 45.3   < > 39.8 40.5  MCV 97.2   < > 94.8 94.8  PLT 230   < > 171 198   < > = values in this interval not  displayed.    Basic Metabolic Panel:  Recent Labs  Lab 07/08/22 1738 07/08/22 2225 07/12/22 0239 07/13/22 0325  NA 139   < > 136 138  K 3.5   < > 3.8 3.9  CL 106   < > 105 106  CO2 27   < > 20* 24  GLUCOSE 125*   < > 100* 93  BUN 9   < > 11 7*  CREATININE 0.65   < > 0.73 0.73  CALCIUM 7.6*   < > 7.1* 7.5*  MG 1.8  --   --   --    < > = values in this interval not displayed.    Lipid Panel:  Recent Labs  Lab 07/09/22 0332  CHOL 207*  TRIG 84  HDL 82  CHOLHDL 2.5  VLDL 17  LDLCALC 108*    HgbA1c:  Recent Labs  Lab 07/09/22 0332  HGBA1C 5.8*    Urine Drug Screen: No results for input(s): "LABOPIA", "COCAINSCRNUR", "LABBENZ", "AMPHETMU", "THCU", "LABBARB" in the last 168 hours.  Alcohol Level No results for input(s): "ETH" in the last 168 hours.  IMAGING past 24 hours No results found.  PHYSICAL EXAM General: Alert,  well-developed, well-nourished elderly patient in no acute distress Respiratory: Regular, unlabored respirations on room air  NEURO:  Mental Status: AA&Ox3  Speech/Language: speech is without dysarthria or aphasia.  Naming, fluency, and comprehension intact.  Cranial Nerves:  II: Right pupil 4 mm and sluggish her baseline due to macular degeneration, left pupil 3 mm and briskly reactive III, IV, VI: EOMI.  V: Sensation is intact to light touch and symmetrical to face.  VII: Smile is symmetrical.   VIII: hearing intact to voice. IX, X: Phonation is normal.  XII: tongue is midline without fasciculations. Motor: No drift in upper or lower extremities. Having some right leg/hip pain when attempting to move. No signs of bruising/swelling. Tone: is normal and bulk is normal Sensation- Intact to light touch bilaterally.,  But greater in right arm the left arm and greater in left leg and right leg Coordination: No ataxia Gait- deferred    ASSESSMENT/PLAN Ms. EYLA TALLON is a 88 y.o. female with history of atrial fibrillation not on  anticoagulation due to falls, GERD, hyperlipidemia, hypertension, hypothyroidism and macular degeneration of bilateral eyes worse on the right with dizziness, nausea and generalized weakness.  She was found to have a thrombus at the tip of the basilar artery, but due to lack of symptoms thrombectomy was deferred and patient was placed on heparin drip with careful observation in the ICU.  Episodes of A-fib with RVR improved with addition of atenolol home dose.  Patient has now been transitioned from heparin to Eliquis  Stroke: Small acute infarcts in left midbrain and right medial pons due to basilar tip thrombus, likely cardioembolic in setting of atrial fibrillation off anticoagulation CT head No acute abnormality. Small vessel disease.  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  Will switch back to eliquis 2.5 mg bid.  Therapy recommendations:  SNF Disposition: Pending  Hypertension Home meds: None Stable Long-term BP goal normotensive  Hyperlipidemia Home meds: None LDL 108, goal < 70 Add ezetimibe 10 mg daily No high intensity statin due to previous intolerance Continue zetia at discharge  Atrial fibrillation Patient has history of atrial fibrillation, not on anticoagulation since 07/05/22 due to frequent falls Now will restart anticoagulate with Eliquis, as benefit is greater than risk  Other Stroke Risk Factors Advanced Age >/= 50   Other Active Problems None  Hospital day # 5  Patient seen and examined by NP/APP with MD. MD to update note as needed.   [place] , MSN, AGACNP-BC [facility] See Amion for schedule and pager information 07/13/2022 12:22 PM      To contact Stroke Continuity provider, please refer to  [email or web address] After hours, contact General Neurology

## 2022-07-13 NOTE — Progress Notes (Signed)
ANTICOAGULATION CONSULT NOTE - Follow Up Consult  Pharmacy Consult for Heparin >> Apixaban Indication: tip of basilar thrombus, per stroke protocol  Allergies  Allergen Reactions   Azithromycin Nausea Only and Other (See Comments)   Compazine [Prochlorperazine Edisylate] Nausea Only   Demerol Nausea Only   Dolophine [Methadone] Nausea Only   Ebastine Nausea Only    EBS   Erythromycin Nausea Only and Other (See Comments)   Gluten Meal Diarrhea   Ibuprofen Nausea Only and Other (See Comments)   Meperidine Hcl Other (See Comments)   Morphine Sulfate Other (See Comments)   Statins Other (See Comments)    myalgias   Tetanus Toxoids Nausea Only   Tetracycline Other (See Comments)   Tetracyclines & Related Nausea Only    Patient Measurements: Height: '4\' 11"'$  (149.9 cm) Weight: 57 kg (125 lb 10.6 oz) IBW/kg (Calculated) : 43.2 Heparin Dosing Weight: 54.8 kg  Vital Signs: Temp: 98.8 F (37.1 C) (11/25 0512) Temp Source: Oral (11/25 0512) BP: 132/65 (11/25 0512) Pulse Rate: 76 (11/24 2036)  Labs: Recent Labs    07/11/22 0247 07/11/22 1227 07/12/22 0239 07/12/22 1055 07/13/22 0325  HGB 13.9  --  13.7  --  13.9  HCT 40.2  --  39.8  --  40.5  PLT 113*  --  171  --  198  HEPARINUNFRC 0.19* 0.32 0.31  --  0.32  CREATININE 0.68  --  0.73  --  0.73  TROPONINIHS  --   --   --  8  --     Estimated Creatinine Clearance: 30.9 mL/min (by C-G formula based on SCr of 0.73 mg/dL).   Medications:  Scheduled:   amLODipine  5 mg Oral Daily   atenolol  25 mg Oral BID   dorzolamide  1 drop Left Eye BID   ezetimibe  10 mg Oral Daily   latanoprost  1 drop Both Eyes QHS   levothyroxine  75 mcg Oral Q0600   multivitamin with minerals  1 tablet Oral Daily   pantoprazole  40 mg Oral Daily   sodium chloride  1 drop Right Eye QHS   Infusions:   heparin 550 Units/hr (07/13/22 3810)    Assessment: 88 yo F with hx afib not on anticoagulation PTA (recently taken off apixaban) due hx of  multiple falls presenting with acute basilar artery thrombosis. Pharmacy consulted to start IV heparin per stroke protocol. Acute neuro change 11/21 PM but stat head CT did not show bleed or significant change.   Heparin level today is therapeutic at 0.32, on 550 units/hr. Hgb 13.9, plt 198 - stable. No line issues or signs/symptoms of bleeding noted per RN. Per MD discussion (Dr. Erlinda Hong and Dr. Reeves Forth) - ok to transition from IV heparin to apixaban this morning.   Goal of Therapy:  Heparin level 0.3-0.5 units/ml, no boluses Monitor platelets by anticoagulation protocol: Yes   Plan:  Stop IV heparin.  Resume apixaban 2.5 mg PO BID.  Daily CBC and monitor for signs/symptoms of bleeding.   Vance Peper, PharmD PGY-2 Pharmacy Resident Phone [phone] 07/13/2022 7:29 AM   Please check AMION for all [facility] phone numbers After 10:00 PM, call [place] [phone]

## 2022-07-14 DIAGNOSIS — I651 Occlusion and stenosis of basilar artery: Secondary | ICD-10-CM | POA: Diagnosis not present

## 2022-07-14 DIAGNOSIS — I482 Chronic atrial fibrillation, unspecified: Secondary | ICD-10-CM

## 2022-07-14 NOTE — Progress Notes (Signed)
STROKE TEAM PROGRESS NOTE   INTERVAL HISTORY Patient neighbor at bedside.  Patient sitting in chair, neuro stable, on Eliquis now.  Pending SNF.  Vitals:   07/14/22 0323 07/14/22 0728 07/14/22 1400 07/14/22 1945  BP: 136/77 128/73 127/76 132/67  Pulse: 65 89 76   Resp: '16 18  16  '$ Temp: (!) 97.3 F (36.3 C) 98.1 F (36.7 C) 98.4 F (36.9 C) 98.6 F (37 C)  TempSrc: Oral Oral Oral Oral  SpO2: 94% 96% 96% 95%  Weight:      Height:       CBC:  Recent Labs  Lab 07/08/22 1448 07/10/22 0538 07/12/22 0239 07/13/22 0325  WBC 7.3   < > 6.0 6.4  NEUTROABS 4.5  --   --   --   HGB 15.0   < > 13.7 13.9  HCT 45.3   < > 39.8 40.5  MCV 97.2   < > 94.8 94.8  PLT 230   < > 171 198   < > = values in this interval not displayed.   Basic Metabolic Panel:  Recent Labs  Lab 07/08/22 1738 07/08/22 2225 07/12/22 0239 07/13/22 0325  NA 139   < > 136 138  K 3.5   < > 3.8 3.9  CL 106   < > 105 106  CO2 27   < > 20* 24  GLUCOSE 125*   < > 100* 93  BUN 9   < > 11 7*  CREATININE 0.65   < > 0.73 0.73  CALCIUM 7.6*   < > 7.1* 7.5*  MG 1.8  --   --   --    < > = values in this interval not displayed.   Lipid Panel:  Recent Labs  Lab 07/09/22 0332  CHOL 207*  TRIG 84  HDL 82  CHOLHDL 2.5  VLDL 17  LDLCALC 108*   HgbA1c:  Recent Labs  Lab 07/09/22 0332  HGBA1C 5.8*   Urine Drug Screen: No results for input(s): "LABOPIA", "COCAINSCRNUR", "LABBENZ", "AMPHETMU", "THCU", "LABBARB" in the last 168 hours.  Alcohol Level No results for input(s): "ETH" in the last 168 hours.  IMAGING past 24 hours No results found.  PHYSICAL EXAM General: Alert, well-developed, well-nourished elderly patient in no acute distress Respiratory: Regular, unlabored respirations on room air  NEURO:  Mental Status: AA&Ox3  Speech/Language: speech is without dysarthria or aphasia.  Naming, fluency, and comprehension intact.  Cranial Nerves:  II: Right pupil 4 mm and sluggish her baseline due to  macular degeneration, left pupil 3 mm and briskly reactive III, IV, VI: EOMI.  V: Sensation is intact to light touch and symmetrical to face.  VII: Smile is symmetrical.   VIII: hearing intact to voice. IX, X: Phonation is normal.  XII: tongue is midline without fasciculations. Motor: No drift in upper or lower extremities. Having some right leg/hip pain when attempting to move. No signs of bruising/swelling. Tone: is normal and bulk is normal Sensation- Intact to light touch bilaterally.,  But greater in right arm the left arm and greater in left leg and right leg Coordination: No ataxia Gait- deferred    ASSESSMENT/PLAN [name] is a 88 y.o. female with history of atrial fibrillation not on anticoagulation due to falls, GERD, hyperlipidemia, hypertension, hypothyroidism and macular degeneration of bilateral eyes worse on the right with dizziness, nausea and generalized weakness.  She was found to have a thrombus at the tip of the basilar artery, but due  to lack of symptoms thrombectomy was deferred and patient was placed on heparin drip with careful observation in the ICU.  Episodes of A-fib with RVR improved with addition of atenolol home dose.  Patient has now been transitioned from heparin to Eliquis  Stroke: Small acute infarcts in left midbrain and right medial pons due to basilar tip thrombus, likely cardioembolic in setting of atrial fibrillation off anticoagulation CT head No acute abnormality. Small vessel disease.  CTA head & neck distal basilar artery occlusion due to thrombus, bilateral PCA and left SCA supplied from left P-comm.  MRI small acute infarcts in left midbrain and right medial pons 2D Echo EF 60 to 65%  LDL 108 HgbA1c 5.8 VTE prophylaxis - eliquis No antithrombotic prior to admission, now on eliquis 2.5 mg bid.  Therapy recommendations:  SNF Disposition: Pending  Hypertension Home meds: None Stable Long-term BP goal  normotensive  Hyperlipidemia Home meds: None LDL 108, goal < 70 Add ezetimibe 10 mg daily No high intensity statin due to previous intolerance Continue zetia at discharge  Atrial fibrillation Patient has history of atrial fibrillation, not on anticoagulation since 07/05/22 due to frequent falls Now back on Eliquis, as benefit is greater than risk  Other Stroke Risk Factors Advanced Age >/= 85   Other Active Problems None  Hospital day # 6  Kristen Hawking, MD PhD Stroke Neurology 07/14/2022 8:11 PM       To contact Stroke Continuity provider, please refer to [email or web address] After hours, contact General Neurology

## 2022-07-14 NOTE — Progress Notes (Signed)
Mobility Specialist Progress Note   07/14/22 1000  Mobility  Activity Ambulated with assistance in room  Level of Assistance Contact guard assist, steadying assist  Assistive Device Front wheel walker  Distance Ambulated (ft) 15 ft  Range of Motion/Exercises Active;All extremities  Activity Response Tolerated well   Patient received in supine eager  to participate. Presented with some confusion or forgetfulness, stating she hasn't been got up in a week,etc. Requested assistance to sink for oral care and to be set-up for a bath. Was independent for bed mobility and stood with min A. Ambulated short distance around bed to sink and performed oral care independently. NT present to assist with bathing. Was left standing at sink with all needs met, NT present.   [place] Brailynn Breth, BS EXP Mobility Specialist Please contact via SecureChat or Rehab office at [phone]

## 2022-07-14 NOTE — Progress Notes (Signed)
[place] auth request for SNF submitted, reference # [id number]. Plan remains for [facility] pending auth and medical clearance.   Wandra Feinstein, MSW, LCSW [phone] (coverage)

## 2022-07-15 ENCOUNTER — Other Ambulatory Visit (HOSPITAL_COMMUNITY): Payer: Self-pay

## 2022-07-15 DIAGNOSIS — I4819 Other persistent atrial fibrillation: Secondary | ICD-10-CM

## 2022-07-15 DIAGNOSIS — I6312 Cerebral infarction due to embolism of basilar artery: Secondary | ICD-10-CM | POA: Diagnosis not present

## 2022-07-15 DIAGNOSIS — I651 Occlusion and stenosis of basilar artery: Secondary | ICD-10-CM | POA: Diagnosis not present

## 2022-07-15 LAB — BASIC METABOLIC PANEL
Anion gap: 8 (ref 5–15)
BUN: 16 mg/dL (ref 8–23)
CO2: 24 mmol/L (ref 22–32)
Calcium: 8.1 mg/dL — ABNORMAL LOW (ref 8.9–10.3)
Chloride: 104 mmol/L (ref 98–111)
Creatinine, Ser: 0.77 mg/dL (ref 0.44–1.00)
GFR, Estimated: >60 mL/min (ref >60)
Glucose, Bld: 120 mg/dL — ABNORMAL HIGH (ref 70–99)
Potassium: 4.1 mmol/L (ref 3.5–5.1)
Sodium: 136 mmol/L (ref 135–145)

## 2022-07-15 LAB — CBC
HCT: 37.4 % (ref 36.0–46.0)
Hemoglobin: 13 g/dL (ref 12.0–15.0)
MCH: 33 pg (ref 26.0–34.0)
MCHC: 34.8 g/dL (ref 30.0–36.0)
MCV: 94.9 fL (ref 80.0–100.0)
Platelets: 168 10*3/uL (ref 150–400)
RBC: 3.94 MIL/uL (ref 3.87–5.11)
RDW: 15.6 % — ABNORMAL HIGH (ref 11.5–15.5)
WBC: 6.9 10*3/uL (ref 4.0–10.5)
nRBC: 0 % (ref 0.0–0.2)

## 2022-07-15 MED ORDER — LEVOTHYROXINE SODIUM 75 MCG PO TABS
75.0000 ug | ORAL_TABLET | Freq: Every day | ORAL | 0 refills | Status: DC
  Filled 2022-07-15: qty 30, 30d supply, fill #0

## 2022-07-15 MED ORDER — EZETIMIBE 10 MG PO TABS
10.0000 mg | ORAL_TABLET | Freq: Every day | ORAL | 0 refills | Status: DC
  Filled 2022-07-15: qty 30, 30d supply, fill #0

## 2022-07-15 MED ORDER — ATENOLOL 25 MG PO TABS
25.0000 mg | ORAL_TABLET | Freq: Two times a day (BID) | ORAL | 0 refills | Status: DC
  Filled 2022-07-15: qty 60, 30d supply, fill #0

## 2022-07-15 MED ORDER — APIXABAN 2.5 MG PO TABS
2.5000 mg | ORAL_TABLET | Freq: Two times a day (BID) | ORAL | 0 refills | Status: DC
  Filled 2022-07-15: qty 60, 30d supply, fill #0

## 2022-07-15 MED ORDER — ADULT MULTIVITAMIN W/MINERALS CH
1.0000 | ORAL_TABLET | Freq: Every day | ORAL | 0 refills | Status: DC
  Filled 2022-07-15: qty 30, 30d supply, fill #0

## 2022-07-15 NOTE — Discharge Summary (Signed)
Stroke Discharge Summary  Patient ID: Kristen Whitehead   MRN: [id number]      DOB: 1923-02-21  Date of Admission: 07/08/2022 Date of Discharge: 07/15/2022  Attending Physician:  Stroke, Md, MD, Stroke MD Consultant(s):    None  Patient's PCP:  [name], NP  DISCHARGE DIAGNOSIS:  Principal Problem:   Stroke: Small acute infarcts in left midbrain and right medial pons due to basilar tip thrombus, likely cardioembolic in setting of atrial fibrillation off anticoagulation   Active Problems:   Atrial fibrillation ([place])   Essential hypertension   Hypothyroidism   Hypercholesterolemia   Allergies as of 07/15/2022       Reactions   Azithromycin Nausea Only, Other (See Comments)   Compazine [prochlorperazine Edisylate] Nausea Only   Demerol Nausea Only   Dolophine [methadone] Nausea Only   Ebastine Nausea Only   EBS   Erythromycin Nausea Only, Other (See Comments)   Gluten Meal Diarrhea   Ibuprofen Nausea Only, Other (See Comments)   Meperidine Hcl Other (See Comments)   Morphine Sulfate Other (See Comments)   Statins Other (See Comments)   myalgias   Tetanus Toxoids Nausea Only   Tetracycline Other (See Comments)   Tetracyclines & Related Nausea Only        Medication List     STOP taking these medications    CALCIUM 600+D PO   furosemide 40 MG tablet Commonly known as: LASIX   MAGNESIUM OXIDE 400 PO   potassium chloride 10 MEQ tablet Commonly known as: KLOR-CON M   PRESERVISION AREDS 2 PO   temazepam 15 MG capsule Commonly known as: RESTORIL   Vitamin D 50 MCG (2000 UT) tablet       TAKE these medications    amLODipine 2.5 MG tablet Commonly known as: NORVASC TAKE 2 TABLETS BY MOUTH  DAILY   apixaban 2.5 MG Tabs tablet Commonly known as: ELIQUIS Take 1 tablet (2.5 mg total) by mouth 2 (two) times daily. What changed: additional instructions   atenolol 25 MG tablet Commonly known as: TENORMIN Take 1 tablet (25 mg total) by mouth 2  (two) times daily.   dorzolamide 2 % ophthalmic solution Commonly known as: TRUSOPT Place 1 drop into the left eye 2 (two) times daily.   ezetimibe 10 MG tablet Commonly known as: ZETIA Take 1 tablet (10 mg total) by mouth daily. Start taking on: July 16, 2022   latanoprost 0.005 % ophthalmic solution Commonly known as: XALATAN Place 1 drop into both eyes at bedtime.   levothyroxine 75 MCG tablet Commonly known as: SYNTHROID Take 1 tablet (75 mcg total) by mouth daily at 6 (six) AM. Start taking on: July 16, 2022 What changed: when to take this   multivitamin with minerals Tabs tablet Take 1 tablet by mouth daily. Start taking on: July 16, 2022   nitroGLYCERIN 0.4 MG SL tablet Commonly known as: Nitrostat DISSOLVE 1 TABLET UNDER THE TONGUE EVERY 5 MINUTES FOR 3 DOSES AS NEEDED  FOR  CHEST  PAIN What changed:  how much to take how to take this when to take this reasons to take this additional instructions   omeprazole 20 MG capsule Commonly known as: PRILOSEC Take 20 mg by mouth daily as needed (acid reflux).   pramipexole 0.125 MG tablet Commonly known as: MIRAPEX TAKE 1 TABLET BY MOUTH AT  BEDTIME   Premarin vaginal cream Generic drug: conjugated estrogens Place 2 g vaginally 2 (two) times a week. Uses on  Sunday and wednesday   PROBIOTIC DAILY PO Take 1 tablet by mouth daily.   sodium chloride 5 % ophthalmic solution Commonly known as: MURO 128 Place 1 drop into the right eye at bedtime.        LABORATORY STUDIES CBC    Component Value Date/Time   WBC 6.9 07/15/2022 0333   RBC 3.94 07/15/2022 0333   HGB 13.0 07/15/2022 0333   HGB 14.2 01/16/2018 1427   HCT 37.4 07/15/2022 0333   HCT 43.9 01/16/2018 1427   PLT 168 07/15/2022 0333   PLT 238 01/16/2018 1427   MCV 94.9 07/15/2022 0333   MCV 95 01/16/2018 1427   MCH 33.0 07/15/2022 0333   MCHC 34.8 07/15/2022 0333   RDW 15.6 (H) 07/15/2022 0333   RDW 14.9 01/16/2018 1427   LYMPHSABS  2.2 07/08/2022 1448   LYMPHSABS 2.2 01/16/2018 1427   MONOABS 0.5 07/08/2022 1448   EOSABS 0.1 07/08/2022 1448   EOSABS 0.1 01/16/2018 1427   BASOSABS 0.0 07/08/2022 1448   BASOSABS 0.0 01/16/2018 1427   CMP    Component Value Date/Time   NA 136 07/15/2022 0333   NA 140 01/27/2020 1211   K 4.1 07/15/2022 0333   CL 104 07/15/2022 0333   CO2 24 07/15/2022 0333   GLUCOSE 120 (H) 07/15/2022 0333   BUN 16 07/15/2022 0333   BUN 11 01/27/2020 1211   CREATININE 0.77 07/15/2022 0333   CREATININE 0.79 06/24/2022 1624   CALCIUM 8.1 (L) 07/15/2022 0333   PROT 6.3 01/31/2022 1051   PROT 6.5 01/16/2018 1427   ALBUMIN 4.1 12/09/2019 1907   ALBUMIN 4.0 01/16/2018 1427   AST 23 01/31/2022 1051   ALT 15 01/31/2022 1051   ALKPHOS 134 (H) 12/09/2019 1907   BILITOT 0.5 01/31/2022 1051   BILITOT 1.0 01/16/2018 1427   GFRNONAA >60 07/15/2022 0333   GFRAA 70 01/27/2020 1211   COAGS Lab Results  Component Value Date   INR 1.3 (A) 04/28/2019   INR 4.6 (A) 04/23/2019   INR 3.2 (A) 04/12/2019   Lipid Panel    Component Value Date/Time   CHOL 207 (H) 07/09/2022 0332   TRIG 84 07/09/2022 0332   HDL 82 07/09/2022 0332   CHOLHDL 2.5 07/09/2022 0332   VLDL 17 07/09/2022 0332   LDLCALC 108 (H) 07/09/2022 0332   HgbA1C  Lab Results  Component Value Date   HGBA1C 5.8 (H) 07/09/2022   Urinalysis    Component Value Date/Time   COLORURINE YELLOW 07/08/2022 1631   APPEARANCEUR HAZY (A) 07/08/2022 1631   LABSPEC 1.006 07/08/2022 1631   PHURINE 8.0 07/08/2022 1631   GLUCOSEU NEGATIVE 07/08/2022 1631   GLUCOSEU NEGATIVE 06/01/2012 1228   HGBUR NEGATIVE 07/08/2022 1631   BILIRUBINUR NEGATIVE 07/08/2022 1631   BILIRUBINUR neg 03/21/2013 1703   KETONESUR NEGATIVE 07/08/2022 1631   PROTEINUR NEGATIVE 07/08/2022 1631   UROBILINOGEN 0.2 11/10/2014 2144   NITRITE NEGATIVE 07/08/2022 1631   LEUKOCYTESUR NEGATIVE 07/08/2022 1631   Urine Drug Screen No results found for: "LABOPIA",  "COCAINSCRNUR", "LABBENZ", "AMPHETMU", "THCU", "LABBARB"  Alcohol Level    Component Value Date/Time   ETH <10 12/09/2019 1907     SIGNIFICANT DIAGNOSTIC STUDIES CT HEAD CODE STROKE WO CONTRAST  Result Date: 07/09/2022 CLINICAL DATA:  Code stroke.  Acute neuro deficit.  Rule out stroke. EXAM: CT HEAD WITHOUT CONTRAST TECHNIQUE: Contiguous axial images were obtained from the base of the skull through the vertex without intravenous contrast. RADIATION DOSE REDUCTION: This exam was performed  according to the departmental dose-optimization program which includes automated exposure control, adjustment of the mA and/or kV according to patient size and/or use of iterative reconstruction technique. COMPARISON:  CT head 07/08/2022.  MRI head 07/08/2022 FINDINGS: Brain: Generalized atrophy. Mild white matter hypodensity bilaterally unchanged. Negative for acute infarct, hemorrhage, or mass. Small acute infarcts in the brainstem undo TBI yesterday are not identified by CT. Vascular: Negative for hyperdense vessel Skull: Negative Sinuses/Orbits: Mild mucosal edema right maxillary sinus. Remaining sinuses clear. Bilateral cataract extraction Other: None ASPECTS (Fowler Stroke Program Early CT Score) - Ganglionic level infarction (caudate, lentiform nuclei, internal capsule, insula, M1-M3 cortex): 7 - Supraganglionic infarction (M4-M6 cortex): 3 Total score (0-10 with 10 being normal): 10 IMPRESSION: 1. No acute abnormality. Atrophy and mild chronic microvascular ischemia. 2. Aspects is 10. 3. These results were called by telephone at the time of interpretation on 07/09/2022 at 5:40 pm to provider [facility] , who verbally acknowledged these results. Electronically Signed   By: Franchot Gallo M.D.   On: 07/09/2022 17:41   ECHOCARDIOGRAM COMPLETE  Result Date: 07/09/2022    ECHOCARDIOGRAM REPORT   Patient Name:   Kristen Whitehead Date of Exam: 07/09/2022 Medical Rec #:  [id number]           Height:        59.0 in Accession #:    [id number]          Weight:       125.7 lb Date of Birth:  [birth year]           BSA:          1.514 m Patient Age:    69 years            BP:           139/86 mmHg Patient Gender: F                   HR:           59 bpm. Exam Location:  Inpatient Procedure: 2D Echo, Color Doppler and Cardiac Doppler Indications:    Stroke i63.9  History:        Patient has prior history of Echocardiogram examinations, most                 recent 12/11/2019. CHF, Arrythmias:Atrial Fibrillation; Risk                 Factors:Hypertension and Dyslipidemia.  Sonographer:    Raquel Sarna Senior RDCS Referring Phys: [id number] [place]  1. Left ventricular ejection fraction, by estimation, is 60 to 65%. The left ventricle has normal function. The left ventricle has no regional wall motion abnormalities. Left ventricular diastolic parameters are indeterminate.  2. Right ventricular systolic function is normal. The right ventricular size is normal. There is normal pulmonary artery systolic pressure. The estimated right ventricular systolic pressure is 09.9 mmHg.  3. Left atrial size was moderately dilated.  4. Right atrial size was mildly dilated.  5. There is no evidence of cardiac tamponade.  6. 2D MVA 3.68 cm2.. The mitral valve is abnormal. Trivial mitral valve regurgitation. Severe mitral annular calcification.  7. The aortic valve was not well visualized. Aortic valve regurgitation is trivial. FINDINGS  Left Ventricle: Left ventricular ejection fraction, by estimation, is 60 to 65%. The left ventricle has normal function. The left ventricle has no regional wall motion abnormalities. The left ventricular internal cavity size was small. There is no left ventricular hypertrophy.  Left ventricular diastolic parameters are indeterminate. Right Ventricle: The right ventricular size is normal. No increase in right ventricular wall thickness. Right ventricular systolic function is normal. There is normal  pulmonary artery systolic pressure. The tricuspid regurgitant velocity is 2.86 m/s, and  with an assumed right atrial pressure of 3 mmHg, the estimated right ventricular systolic pressure is 64.6 mmHg. Left Atrium: Left atrial size was moderately dilated. Right Atrium: Right atrial size was mildly dilated. Pericardium: Trivial pericardial effusion is present. The pericardial effusion is circumferential. There is no evidence of cardiac tamponade. Mitral Valve: 2D MVA 3.68 cm2. The mitral valve is abnormal. Severe mitral annular calcification. Trivial mitral valve regurgitation. Tricuspid Valve: The tricuspid valve is normal in structure. Tricuspid valve regurgitation is mild . No evidence of tricuspid stenosis. Aortic Valve: The aortic valve was not well visualized. Aortic valve regurgitation is trivial. Pulmonic Valve: The pulmonic valve was grossly normal. Pulmonic valve regurgitation is trivial. Aorta: The aortic root is normal in size and structure. IAS/Shunts: No atrial level shunt detected by color flow Doppler.  LEFT VENTRICLE PLAX 2D LVIDd:         3.40 cm   Diastology LVIDs:         2.50 cm   LV e' medial:    6.31 cm/s LV PW:         0.90 cm   LV E/e' medial:  16.6 LV IVS:        1.00 cm   LV e' lateral:   9.46 cm/s LVOT diam:     2.00 cm   LV E/e' lateral: 11.1 LV SV:         67 LV SV Index:   44 LVOT Area:     3.14 cm  RIGHT VENTRICLE RV S prime:     10.20 cm/s TAPSE (M-mode): 1.2 cm LEFT ATRIUM             Index        RIGHT ATRIUM           Index LA diam:        3.60 cm 2.38 cm/m   RA Area:     20.20 cm LA Vol (A2C):   64.5 ml 42.61 ml/m  RA Volume:   51.20 ml  33.82 ml/m LA Vol (A4C):   61.1 ml 40.36 ml/m LA Biplane Vol: 63.9 ml 42.21 ml/m  AORTIC VALVE LVOT Vmax:   93.30 cm/s LVOT Vmean:  66.700 cm/s LVOT VTI:    0.213 m  AORTA Ao Root diam: 2.90 cm MITRAL VALVE                TRICUSPID VALVE MV Area (PHT): 5.23 cm     TR Peak grad:   32.7 mmHg MV Decel Time: 145 msec     TR Vmax:        286.00  cm/s MV E velocity: 105.00 cm/s MV A velocity: 31.60 cm/s   SHUNTS MV E/A ratio:  3.32         Systemic VTI:  0.21 m                             Systemic Diam: 2.00 cm Rudean Haskell MD Electronically signed by Rudean Haskell MD Signature Date/Time: 07/09/2022/9:46:21 AM    Final    MR BRAIN WO CONTRAST  Result Date: 07/08/2022 CLINICAL DATA:  Stroke suspected EXAM: MRI HEAD WITHOUT CONTRAST TECHNIQUE: Multiplanar, multiecho pulse sequences of  the brain and surrounding structures were obtained without intravenous contrast. COMPARISON:  No prior MRI, correlation is made with CTA head neck 07/08/2022 FINDINGS: Brain: Restricted diffusion with ADC correlate in the left midbrain (series 5, image [id number]), with additional small focus of restricted diffusion with ADC correlate in the right medial pons (series 5, image 71), consistent with acute infarcts. These are not associated with significant T2 hyperintense signal at this time. No acute hemorrhage, mass, mass effect, or midline shift. No hydrocephalus or extra-axial collection. T2 hyperintense signal in the periventricular white matter, likely the sequela of mild-to-moderate chronic small vessel ischemic disease. Remote lacunar infarcts in the right frontal and left parietal lobes. Cerebral volume is better than expected for age. No hemosiderin deposition to suggest remote hemorrhage. Vascular: Loss of the distal basilar artery flow void (series 11, image 11), which is consistent with the occlusion seen on the same-day CTA. Otherwise normal arterial flow voids. Skull and upper cervical spine: Normal marrow signal. Sinuses/Orbits: Clear paranasal sinuses. Other: Fluid in left mastoid air cells. IMPRESSION: 1. Small acute infarcts in the left midbrain and right medial pons. 2. Loss of the distal basilar artery flow void, consistent with the occlusion seen on the same-day CTA. Imaging results were communicated on 07/08/2022 at 11:45 pm to provider Dr.  Lorrin Goodell via secure text paging. Electronically Signed   By: Merilyn Baba M.D.   On: 07/08/2022 23:45   CT ANGIO HEAD NECK W WO CM  Result Date: 07/08/2022 CLINICAL DATA:  Diplopia; atrial fibrillation EXAM: CT ANGIOGRAPHY HEAD AND NECK TECHNIQUE: Multidetector CT imaging of the head and neck was performed using the standard protocol during bolus administration of intravenous contrast. Multiplanar CT image reconstructions and MIPs were obtained to evaluate the vascular anatomy. Carotid stenosis measurements (when applicable) are obtained utilizing NASCET criteria, using the distal internal carotid diameter as the denominator. RADIATION DOSE REDUCTION: This exam was performed according to the departmental dose-optimization program which includes automated exposure control, adjustment of the mA and/or kV according to patient size and/or use of iterative reconstruction technique. CONTRAST:  23m OMNIPAQUE IOHEXOL 350 MG/ML SOLN COMPARISON:  06/11/2017 CT head, CTA head neck 12/30/2008 FINDINGS: CT HEAD FINDINGS Brain: No evidence of acute infarct, hemorrhage, mass, mass effect, or midline shift. No hydrocephalus or extra-axial fluid collection. Periventricular white matter changes, likely the sequela of chronic small vessel ischemic disease. Vascular: No hyperdense vessel. Skull: Normal. Negative for fracture or focal lesion. Sinuses/Orbits: No acute finding. Status post bilateral lens replacements. Other: The mastoid air cells are well aerated. CTA NECK FINDINGS Aortic arch: Standard branching. Imaged portion shows no evidence of aneurysm or dissection. 45-50% stenosis in the proximal left subclavian artery (series 13, image 292). Aortic atherosclerosis. Right carotid system: No evidence of dissection, occlusion, or hemodynamically significant stenosis (greater than 50%). Atherosclerotic disease at the bifurcation and in the proximal ICA is not hemodynamically significant. Left carotid system: No evidence of  dissection, occlusion, or hemodynamically significant stenosis (greater than 50%). Atherosclerotic disease at the bifurcation and in the proximal ICA is not hemodynamically significant. Vertebral arteries: No evidence of dissection, occlusion, or hemodynamically significant stenosis (greater than 50%). Skeleton: No acute osseous abnormality. Degenerative changes in the cervical spine. Other neck: No acute finding. Upper chest: Mosaic attenuation, consistent with air trapping. Calcified lesion near the right apex, likely sequela of prior granulomatous disease. No other focal pulmonary opacity or pleural effusion. Review of the MIP images confirms the above findings CTA HEAD FINDINGS Anterior circulation: Both internal carotid  arteries are patent to the termini, with mild calcifications but without significant stenosis. A1 segments patent. Normal anterior communicating artery. Anterior cerebral arteries are patent to their distal aspects. No M1 stenosis or occlusion. MCA branches perfused and symmetric. Posterior circulation: Vertebral arteries patent to the vertebrobasilar junction without stenosis. Posterior inferior cerebellar arteries patent proximally. The basilar artery is occluded distally (series 14, image 94), likely thrombosed. The right superior cerebellar artery is not opacified. The bilateral PCAs and left superior cerebellar artery are opacified, secondary to a patent left posterior communicating artery. Patent P1 segments. PCAs perfused to their distal aspects without stenosis. The right posterior communicating artery is not visualized. Venous sinuses: As permitted by contrast timing, patent. Anatomic variants: None significant. Review of the MIP images confirms the above findings IMPRESSION: 1. 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 The right superior cerebellar artery is not opacified. The bilateral PCAs and left superior cerebellar artery are opacified, secondary to a  patent left posterior communicating artery. 2. 45-50% stenosis in the proximal left subclavian artery. No additional hemodynamically significant stenosis in the neck. 3. No additional acute intracranial process. 4. Aortic atherosclerosis. Aortic Atherosclerosis (ICD10-I70.0). These results were called by telephone at the time of interpretation on 07/08/2022 at 8:10 pm to provider Wynona Dove , who verbally acknowledged these results. Electronically Signed   By: Merilyn Baba M.D.   On: 07/08/2022 20:10   DG Chest Port 1 View  Result Date: 07/08/2022 CLINICAL DATA:  AFib EXAM: PORTABLE CHEST 1 VIEW COMPARISON:  Chest x-ray dated February 03, 2020 FINDINGS: Cardiac and mediastinal contours are unchanged. New nodular opacity of the right lower lung. Lungs otherwise clear. No evidence of pleural effusion or pneumothorax. IMPRESSION: New indeterminate nodular opacity of the right lower lung. Recommend chest CT for further evaluation, which can be performed non emergently. Electronically Signed   By: Yetta Glassman M.D.   On: 07/08/2022 15:16      HISTORY OF PRESENT ILLNESS Kristen Whitehead is a 88 y.o. female with PMH significant for chronic atrial fibrillation who is currently not on anticoagulation due to history of multiple falls, GERD, hypercholesterolemia, hypertension, hypothyroidism, macular degeneration of bilateral eyes with left worse than right who was recently taken off of her Eliquis for multiple falls.   Patient went to bed last night at 0200 07/08/2022.  She woke up at 11 AM 07/08/2022 and was able to get up from the bed and walk fine.  She went to take her medications for the morning around 11:30 AM and felt dizzy and nauseous and had to sit down.  She reports that since then she has been experiencing generalized weakness worse with trying to stand up.  She called her family who eventually brought her to the hospital for further evaluation and work-up.  Family also reports that she has been not  as talkative and sometimes her speech would be garbled intermittently but seem fine other times.  Patient reports that she is experiencing double vision which again is intermittent.   In the ED, she had work-up with CT head without contrast which was negative for any acute abnormality.  CT angio head and neck demonstrated a thrombus at the tip of the basilar artery.   LKW: 11:30 AM on 07/08/2022. mRS: 0 tNKASE: Not offered as she is outside the window. Thrombectomy: Discussed risks and benefits of thrombectomy along with alternatives including careful observation. This was discussed by me earlier and later by Dr. Margarita Sermons with patient  and her son at the bedside. At this time, given her symptoms are mild, patient agreed with careful observation in the ICU. However, if patient was to experience worsening of her symptoms or felt her symptoms are more disabling, she is agreeable to thrombectomy. Dr. Ethel Rana rodrigues consented patient over the phone just in vase if she was to have worsening of her deficit. Patient or her family did not have any additional questions. Will admit her to the ICU for close observation.   HOSPITAL COURSE [name] is a 88 y.o. female with history of atrial fibrillation not on anticoagulation due to falls, GERD, hyperlipidemia, hypertension, hypothyroidism and macular degeneration of bilateral eyes worse on the right with dizziness, nausea and generalized weakness.  She was found to have a thrombus at the tip of the basilar artery, but due to lack of symptoms thrombectomy was deferred and patient was placed on heparin drip with careful observation in the ICU.  Episodes of A-fib with RVR improved with addition of atenolol home dose.  Patient has now been transitioned from heparin to Eliquis   Stroke: Small acute infarcts in left midbrain and right medial pons due to basilar tip thrombus, likely cardioembolic in setting of atrial fibrillation off  anticoagulation CT head No acute abnormality. Small vessel disease.  CTA head & neck distal basilar artery occlusion due to thrombus, bilateral PCA and left SCA supplied from left P-comm.  MRI small acute infarcts in left midbrain and right medial pons 2D Echo EF 60 to 65%  LDL 108 HgbA1c 5.8 VTE prophylaxis - eliquis No antithrombotic prior to admission, now on eliquis 2.5 mg bid.  Therapy recommendations:  SNF Disposition: Pending   Hypertension Home meds: None Stable Long-term BP goal normotensive   Hyperlipidemia Home meds: None LDL 108, goal < 70 Add ezetimibe 10 mg daily No high intensity statin due to previous intolerance Continue zetia at discharge   Atrial fibrillation Patient has history of atrial fibrillation, not on anticoagulation since 07/05/22 due to frequent falls Now back on Eliquis, as benefit is greater than risk   Other Stroke Risk Factors Advanced Age >/= 72    Other Active Problems None   DISCHARGE EXAM Blood pressure 103/65, pulse 79, temperature 98.6 F (37 C), temperature source Oral, resp. rate 16, height '4\' 11"'$  (1.499 m), weight 57 kg, SpO2 96 %.  General: Alert, well-developed, well-nourished elderly patient in no acute distress Respiratory: Regular, unlabored respirations on room air   NEURO:  Mental Status: AA&Ox3  Speech/Language: speech is without dysarthria or aphasia.  Naming, fluency, and comprehension intact.   Cranial Nerves:  II: Right pupil 4 mm and sluggish her baseline due to macular degeneration, left pupil 3 mm and briskly reactive III, IV, VI: EOMI.  V: Sensation is intact to light touch and symmetrical to face.  VII: Smile is symmetrical.   VIII: hearing intact to voice. IX, X: Phonation is normal.  XII: tongue is midline without fasciculations. Motor: No drift in upper or lower extremities. Having some right leg/hip pain when attempting to move. No signs of bruising/swelling. Tone: is normal and bulk is  normal Sensation- Intact to light touch bilaterally.,  But greater in right arm the left arm and greater in left leg and right leg Coordination: No ataxia Gait- deferred  Discharge Diet       Diet   Diet regular Room service appropriate? Yes; Fluid consistency: Thin   liquids  DISCHARGE PLAN Disposition:  SNF Eliquis (apixaban) dbid for  secondary stroke prevention Ongoing stroke risk factor control by Primary Care Physician at time of discharge Follow-up PCP [place], Amy E, NP in 2 weeks. Follow-up in [facility] in 4 weeks, office to schedule an appointment.   35 minutes were spent preparing discharge.  Rosalin Hawking, MD PhD Stroke Neurology 07/15/2022 6:02 PM

## 2022-07-15 NOTE — Care Management Important Message (Signed)
Important Message  Patient Details  Name: Kristen Whitehead MRN: [id number] Date of Birth: April 08, 1923   Medicare Important Message Given:  Yes     Orbie Pyo 07/15/2022, 3:26 PM

## 2022-07-15 NOTE — Progress Notes (Signed)
Occupational Therapy Treatment Patient Details Name: Kristen Whitehead MRN: [id number] DOB: December 10, 1922 Today's Date: 07/15/2022   History of present illness Kristen Whitehead is a 88 y.o. female admitted 07/08/22 with intermittent nausea, generalized weakness, episode of aphasia earlier and diplopia. 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 CT revealed distal basilar thrombus. PMH includes chronic atrial fibrillation who is currently not on anticoagulation due to history of multiple falls, GERD, hypercholesterolemia, hypertension, hypothyroidism, macular degeneration of bilateral eyes with left worse than right who was recently taken off of her Eliquis for multiple falls.   OT comments  Pt progressing towards OT goals this session. Vision continues to be reported in conflicting ways - functionally she can navigate room, open ADL items, but could not order food from menu. STM still very poor and decreased safety awareness. Eager to work with therapy and min guard for bed mobility, walking to sink for grooming tasks. She was mod A for LB dressing and toilet peri care. Pt continues to benefit from skilled OT in the acute setting as well as afterwards at the SNF level.    Recommendations for follow up therapy are one component of a multi-disciplinary discharge planning process, led by the attending physician.  Recommendations may be updated based on patient status, additional functional criteria and insurance authorization.    Follow Up Recommendations  Skilled nursing-short term rehab (<3 hours/day)     Assistance Recommended at Discharge Frequent or constant Supervision/Assistance  Patient can return home with the following  A little help with walking and/or transfers;A lot of help with bathing/dressing/bathroom;Assistance with cooking/housework;Direct supervision/assist for medications management;Direct supervision/assist for financial management;Assist for  transportation;Help with stairs or ramp for entrance   Equipment Recommendations  BSC/3in1;Other (comment)    Recommendations for Other Services PT consult;Speech consult    Precautions / Restrictions Precautions Precautions: Fall Precaution Comments: incontinence Restrictions Weight Bearing Restrictions: No       Mobility Bed Mobility Overal bed mobility: Needs Assistance Bed Mobility: Sit to Supine     Supine to sit: HOB elevated, Supervision     General bed mobility comments: cues to bring body square for EOB, but no physical assist    Transfers Overall transfer level: Needs assistance Equipment used: Rolling walker (2 wheels) Transfers: Sit to/from Stand Sit to Stand: Min guard           General transfer comment: vc for hand placement with every transfer from bed, toilet, chair     Balance Overall balance assessment: Needs assistance Sitting-balance support: Feet supported Sitting balance-Leahy Scale: Fair Sitting balance - Comments: reaching in sitting to don sock Postural control: Posterior lean Standing balance support: Bilateral upper extremity supported, During functional activity Standing balance-Leahy Scale: Poor Standing balance comment: requires UE support while standing                           ADL either performed or assessed with clinical judgement   ADL Overall ADL's : Needs assistance/impaired     Grooming: Wash/dry hands;Wash/dry face;Oral care;Min guard;Standing               Lower Body Dressing: Moderate assistance Lower Body Dressing Details (indicate cue type and reason): to don shoes and socks Toilet Transfer: Min guard;Rolling walker (2 wheels)   Toileting- Clothing Manipulation and Hygiene: Moderate assistance;Sit to/from stand Toileting - Clothing Manipulation Details (indicate cue type and reason): mod A to pull up mesh underwear  Functional mobility during ADLs: Min guard;Rolling walker (2 wheels)       Extremity/Trunk Assessment Upper Extremity Assessment Upper Extremity Assessment: Generalized weakness   Lower Extremity Assessment Lower Extremity Assessment: Defer to PT evaluation        Vision   Vision Assessment?: Vision impaired- to be further tested in functional context Additional Comments: Pt providing conflicting information about what she can see   Perception     Praxis      Cognition Arousal/Alertness: Awake/alert Behavior During Therapy: WFL for tasks assessed/performed Overall Cognitive Status: Impaired/Different from baseline Area of Impairment: Memory, Safety/judgement, Problem solving                     Memory: Decreased short-term memory   Safety/Judgement: Decreased awareness of safety, Decreased awareness of deficits   Problem Solving: Slow processing, Requires verbal cues          Exercises      Shoulder Instructions       General Comments      Pertinent Vitals/ Pain       Pain Assessment Pain Assessment: No/denies pain  Home Living                                          Prior Functioning/Environment              Frequency  Min 2X/week        Progress Toward Goals  OT Goals(current goals can now be found in the care plan section)  Progress towards OT goals: Progressing toward goals  Acute Rehab OT Goals Patient Stated Goal: get stronger at rehab OT Goal Formulation: With patient Time For Goal Achievement: 07/23/22 Potential to Achieve Goals: Good  Plan Discharge plan remains appropriate;Frequency remains appropriate    Co-evaluation                 AM-PAC OT "6 Clicks" Daily Activity     Outcome Measure   Help from another person eating meals?: A Little Help from another person taking care of personal grooming?: A Little Help from another person toileting, which includes using toliet, bedpan, or urinal?: A Little Help from another person bathing (including washing, rinsing,  drying)?: A Little Help from another person to put on and taking off regular upper body clothing?: A Little Help from another person to put on and taking off regular lower body clothing?: A Lot 6 Click Score: 17    End of Session Equipment Utilized During Treatment: Gait belt;Rolling walker (2 wheels)  OT Visit Diagnosis: Unsteadiness on feet (R26.81);Other abnormalities of gait and mobility (R26.89);Repeated falls (R29.6);Muscle weakness (generalized) (M62.81);History of falling (Z91.81);Low vision, both eyes (H54.2);Other symptoms and signs involving the nervous system (R29.898)   Activity Tolerance Patient tolerated treatment well   Patient Left in chair;with call bell/phone within reach;with chair alarm set   Nurse Communication Mobility status;Precautions (provided ensure)        Time: 2992-4268 OT Time Calculation (min): 29 min  Charges: OT General Charges $OT Visit: 1 Visit OT Treatments $Self Care/Home Management : 8-22 mins $Therapeutic Activity: 8-22 mins  Jesse Sans OTR/L Acute Rehabilitation Services Office: [place] 07/15/2022, 12:59 PM

## 2022-07-15 NOTE — Progress Notes (Signed)
Physical Therapy Treatment Patient Details Name: Kristen Whitehead MRN: [id number] DOB: [birth year] Today's Date: 07/15/2022   History of Present Illness Kristen Whitehead is a 88 y.o. female admitted 07/08/22 with intermittent nausea, generalized weakness, episode of aphasia earlier and diplopia. 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 CT revealed distal basilar thrombus. PMH includes chronic atrial fibrillation who is currently not on anticoagulation due to history of multiple falls, GERD, hypercholesterolemia, hypertension, hypothyroidism, macular degeneration of bilateral eyes with left worse than right who was recently taken off of her Eliquis for multiple falls.    PT Comments    Patient eager to work with therapy to improve her gait, balance and strength. Patient able to progress ambulation to 150 ft with RW and min assist for walker management and balance. After seated rest break, pt participated in seated and standing exercises for strengthening and balance training.     Recommendations for follow up therapy are one component of a multi-disciplinary discharge planning process, led by the attending physician.  Recommendations may be updated based on patient status, additional functional criteria and insurance authorization.  Follow Up Recommendations  Skilled nursing-short term rehab (<3 hours/day) Can patient physically be transported by private vehicle: Yes   Assistance Recommended at Discharge Frequent or constant Supervision/Assistance  Patient can return home with the following A little help with walking and/or transfers;A little help with bathing/dressing/bathroom;Help with stairs or ramp for entrance;Assist for transportation;Assistance with cooking/housework;Direct supervision/assist for medications management   Equipment Recommendations  None recommended by PT    Recommendations for Other Services       Precautions / Restrictions  Precautions Precautions: Fall Precaution Comments: incontinence Restrictions Weight Bearing Restrictions: No     Mobility  Bed Mobility               General bed mobility comments: up in recliner    Transfers Overall transfer level: Needs assistance Equipment used: Rolling walker (2 wheels) Transfers: Sit to/from Stand Sit to Stand: Min guard           General transfer comment: Min guard A up from chair with max cues for hand placement each trial (x 4)    Ambulation/Gait Ambulation/Gait assistance: Min assist Gait Distance (Feet): 150 Feet Assistive device: Rolling walker (2 wheels) Gait Pattern/deviations: Step-to pattern, Step-through pattern, Wide base of support, Decreased stride length, Trunk flexed   Gait velocity interpretation: 1.31 - 2.62 ft/sec, indicative of limited community ambulator   General Gait Details: pt initially with step-to pattern with RW with good technique; progressed to step-through with assist to keep RW consistently moving; able to progress to rolling the RW without assist, but needs cues for proximity to [employer]    Modified Rankin (Stroke Patients Only) Modified Rankin (Stroke Patients Only) Pre-Morbid Rankin Score: Slight disability Modified Rankin: Moderately severe disability     Balance Overall balance assessment: Needs assistance Sitting-balance support: Feet supported Sitting balance-Leahy Scale: Fair Sitting balance - Comments: reaching in sitting to don sock Postural control: Posterior lean Standing balance support: Bilateral upper extremity supported, During functional activity Standing balance-Leahy Scale: Poor Standing balance comment: requires UE support while standing                            Cognition Arousal/Alertness: Awake/alert Behavior During Therapy: WFL for tasks assessed/performed Overall Cognitive  Status: Impaired/Different from baseline Area  of Impairment: Memory, Safety/judgement, Problem solving                     Memory: Decreased short-term memory   Safety/Judgement: Decreased awareness of safety, Decreased awareness of deficits   Problem Solving: Slow processing, Requires verbal cues General Comments: requires repeated cues for hand placement with RW throughout session (no incr carryover with incr reps); asking if she can do standing exercises on her own after just having LOB requiring min assist to recover        Exercises General Exercises - Lower Extremity Long Arc Quad: AROM, Both, 10 reps, Seated Hip Flexion/Marching: AAROM, Both, 15 reps, Standing (with bil UE support via RW) Toe Raises: AROM, Both, 10 reps, Seated Heel Raises: AROM, Both, 10 reps, Seated    General Comments        Pertinent Vitals/Pain Pain Assessment Pain Assessment: No/denies pain    Home Living                          Prior Function            PT Goals (current goals can now be found in the care plan section) Acute Rehab PT Goals Patient Stated Goal: to return to independent Time For Goal Achievement: 07/23/22 Potential to Achieve Goals: Fair Progress towards PT goals: Progressing toward goals    Frequency    Min 2X/week      PT Plan Current plan remains appropriate    Co-evaluation              AM-PAC PT "6 Clicks" Mobility   Outcome Measure  Help needed turning from your back to your side while in a flat bed without using bedrails?: A Lot Help needed moving from lying on your back to sitting on the side of a flat bed without using bedrails?: A Lot Help needed moving to and from a bed to a chair (including a wheelchair)?: A Little Help needed standing up from a chair using your arms (e.g., wheelchair or bedside chair)?: A Little Help needed to walk in hospital room?: A Little Help needed climbing 3-5 steps with a railing? : Total 6 Click Score: 14    End of Session Equipment  Utilized During Treatment: Gait belt Activity Tolerance: Patient limited by fatigue (required rest break after ambulation prior to exercises) Patient left: in chair;with call bell/phone within reach;with chair alarm set   PT Visit Diagnosis: Other abnormalities of gait and mobility (R26.89);Other symptoms and signs involving the nervous system (R29.898);Muscle weakness (generalized) (M62.81);History of falling (Z91.81)     Time: 6389-3734 PT Time Calculation (min) (ACUTE ONLY): 24 min  Charges:  $Gait Training: 8-22 mins $Therapeutic Exercise: 8-22 mins                      Arby Barrette, PT Acute Rehabilitation Services  Office [phone]    Rexanne Mano 07/15/2022, 12:23 PM

## 2022-07-15 NOTE — TOC Progression Note (Signed)
Transition of Care [facility]) - Progression Note    Patient Details  Name: MAZIE FENCL MRN: [id number] Date of Birth: [birth year]  Transition of Care [facility]) CM/SW [place], [place] Phone Number: 07/15/2022, 8:49 AM  Clinical Narrative:     Insurance still pending   Expected Discharge Plan: [place] Barriers to Discharge: Continued Medical Work up, Ship broker, SNF Pending bed offer  Expected Discharge Plan and Services Expected Discharge Plan: [place] In-house Referral: Clinical Social Work   Post Acute Care Choice: [place] Living arrangements for the past 2 months: Single Family Home                                       Social Determinants of Health (SDOH) Interventions    Readmission Risk Interventions     No data to display

## 2022-07-15 NOTE — Progress Notes (Signed)
Speech Language Pathology Treatment: Cognitive-Linquistic  Patient Details Name: Kristen Whitehead MRN: [id number] DOB: 1922-10-27 Today's Date: 07/15/2022 Time: 5681-2751 SLP Time Calculation (min) (ACUTE ONLY): 17 min  Assessment / Plan / Recommendation Clinical Impression  Pt was alert, participatory, talking a lot about her family and history.  Demonstrates fluctuating recall of events of day with intermittent verbal cues provided.  Long-term recall is intact. Reports word-finding difficulty that was present prior to stroke but not notable today.  Task attention and verbal sequencing, problem-solving WFL.  Oriented x4. Motivated to work in rehab.  Demonstrates improvements from time of initial evaluation. Will follow.   HPI HPI: Pt is a 88 y.o. female who presented 07/08/22 with intermittent nausea, generalized weakness, episode of aphasia and diplopia. MRI brain: Small acute infarcts in the left midbrain and right medial pons. CT revealed distal basilar thrombus. PMH: chronic atrial fibrillation who is currently not on anticoagulation due to history of multiple falls, GERD, hypercholesterolemia, hypertension, hypothyroidism, macular degeneration of bilateral eyes with left worse than right who was recently taken off of her Eliquis for multiple falls.      SLP Plan  Continue with current plan of care      Recommendations for follow up therapy are one component of a multi-disciplinary discharge planning process, led by the attending physician.  Recommendations may be updated based on patient status, additional functional criteria and insurance authorization.    Recommendations                   Follow Up Recommendations: Skilled nursing-short term rehab (<3 hours/day) Assistance recommended at discharge: Intermittent Supervision/Assistance SLP Visit Diagnosis: Cognitive communication deficit (Z00.174) Plan: Continue with current plan of care         Royalty Domagala L. Tivis Ringer, MA  CCC/SLP Clinical Specialist - Acute Care SLP Acute Rehabilitation Services Office number [phone]   Kristen Whitehead  07/15/2022, 3:50 PM

## 2022-07-16 DIAGNOSIS — I69828 Other speech and language deficits following other cerebrovascular disease: Secondary | ICD-10-CM | POA: Diagnosis not present

## 2022-07-16 DIAGNOSIS — M545 Low back pain, unspecified: Secondary | ICD-10-CM | POA: Diagnosis not present

## 2022-07-16 DIAGNOSIS — R42 Dizziness and giddiness: Secondary | ICD-10-CM | POA: Diagnosis not present

## 2022-07-16 DIAGNOSIS — I10 Essential (primary) hypertension: Secondary | ICD-10-CM | POA: Diagnosis not present

## 2022-07-16 DIAGNOSIS — I482 Chronic atrial fibrillation, unspecified: Secondary | ICD-10-CM | POA: Diagnosis not present

## 2022-07-16 DIAGNOSIS — I69891 Dysphagia following other cerebrovascular disease: Secondary | ICD-10-CM | POA: Diagnosis not present

## 2022-07-16 DIAGNOSIS — E785 Hyperlipidemia, unspecified: Secondary | ICD-10-CM | POA: Diagnosis not present

## 2022-07-16 DIAGNOSIS — K219 Gastro-esophageal reflux disease without esophagitis: Secondary | ICD-10-CM | POA: Diagnosis not present

## 2022-07-16 DIAGNOSIS — E039 Hypothyroidism, unspecified: Secondary | ICD-10-CM | POA: Diagnosis not present

## 2022-07-16 DIAGNOSIS — H3530 Unspecified macular degeneration: Secondary | ICD-10-CM | POA: Diagnosis not present

## 2022-07-16 DIAGNOSIS — R2689 Other abnormalities of gait and mobility: Secondary | ICD-10-CM | POA: Diagnosis not present

## 2022-07-16 DIAGNOSIS — I5032 Chronic diastolic (congestive) heart failure: Secondary | ICD-10-CM | POA: Diagnosis not present

## 2022-07-16 DIAGNOSIS — I2510 Atherosclerotic heart disease of native coronary artery without angina pectoris: Secondary | ICD-10-CM | POA: Diagnosis not present

## 2022-07-16 DIAGNOSIS — I69398 Other sequelae of cerebral infarction: Secondary | ICD-10-CM | POA: Diagnosis not present

## 2022-07-16 DIAGNOSIS — R2681 Unsteadiness on feet: Secondary | ICD-10-CM | POA: Diagnosis not present

## 2022-07-16 DIAGNOSIS — Z743 Need for continuous supervision: Secondary | ICD-10-CM | POA: Diagnosis not present

## 2022-07-16 DIAGNOSIS — I639 Cerebral infarction, unspecified: Secondary | ICD-10-CM | POA: Diagnosis not present

## 2022-07-16 DIAGNOSIS — I4891 Unspecified atrial fibrillation: Secondary | ICD-10-CM | POA: Diagnosis not present

## 2022-07-16 DIAGNOSIS — R531 Weakness: Secondary | ICD-10-CM | POA: Diagnosis not present

## 2022-07-16 DIAGNOSIS — M6281 Muscle weakness (generalized): Secondary | ICD-10-CM | POA: Diagnosis not present

## 2022-07-16 NOTE — Plan of Care (Signed)
  Problem: Education: Goal: Knowledge of disease or condition will improve Outcome: Progressing Goal: Knowledge of secondary prevention will improve (MUST DOCUMENT ALL) Outcome: Progressing Goal: Knowledge of patient specific risk factors will improve Kristen Whitehead N/A or DELETE if not current risk factor) Outcome: Progressing   Problem: Coping: Goal: Will verbalize positive feelings about self Outcome: Progressing   Problem: Nutrition: Goal: Risk of aspiration will decrease Outcome: Progressing

## 2022-07-16 NOTE — TOC Transition Note (Signed)
Transition of Care [facility]) - CM/SW Discharge Note   Patient Details  Name: DELENA CASEBEER MRN: [id number] Date of Birth: Aug 09, 1923  Transition of Care [facility]) CM/SW Contact:  Jinger Neighbors, LCSW Phone Number: 07/16/2022, 9:41 AM   Clinical Narrative:     Discharge complete.  Pt going to AF via PTAR.  Report #: [phone] Room Number: 811   Final next level of care: Skilled Nursing Facility Barriers to Discharge: No Barriers Identified   Patient Goals and CMS Choice Patient states their goals for this hospitalization and ongoing recovery are:: To recover from stroke CMS Medicare.gov Compare Post Acute Care list provided to:: Patient Choice offered to / list presented to : Patient  Discharge Placement              Patient [facility] at: [facility] Patient to be transferred to facility by: [place] Name of family member notified: Margarite Patient and family notified of of transfer: 07/16/22  Discharge Plan and Services In-house Referral: Clinical Social Work   Post Acute Care Choice: [place]                               Social Determinants of Health (SDOH) Interventions     Readmission Risk Interventions     No data to display

## 2022-07-16 NOTE — Progress Notes (Signed)
Attempted to call report to RN at [facility], no answer. Patient sent via EMS with all belongings. VSS and PIV removed.

## 2022-07-17 DIAGNOSIS — I639 Cerebral infarction, unspecified: Secondary | ICD-10-CM | POA: Diagnosis not present

## 2022-07-17 DIAGNOSIS — I10 Essential (primary) hypertension: Secondary | ICD-10-CM | POA: Diagnosis not present

## 2022-07-17 DIAGNOSIS — I4891 Unspecified atrial fibrillation: Secondary | ICD-10-CM | POA: Diagnosis not present

## 2022-07-17 DIAGNOSIS — K219 Gastro-esophageal reflux disease without esophagitis: Secondary | ICD-10-CM | POA: Diagnosis not present

## 2022-07-18 DIAGNOSIS — I482 Chronic atrial fibrillation, unspecified: Secondary | ICD-10-CM | POA: Diagnosis not present

## 2022-07-18 DIAGNOSIS — I5032 Chronic diastolic (congestive) heart failure: Secondary | ICD-10-CM | POA: Diagnosis not present

## 2022-07-18 DIAGNOSIS — I10 Essential (primary) hypertension: Secondary | ICD-10-CM | POA: Diagnosis not present

## 2022-07-18 DIAGNOSIS — H3530 Unspecified macular degeneration: Secondary | ICD-10-CM | POA: Diagnosis not present

## 2022-07-18 DIAGNOSIS — I2510 Atherosclerotic heart disease of native coronary artery without angina pectoris: Secondary | ICD-10-CM | POA: Diagnosis not present

## 2022-07-18 DIAGNOSIS — E039 Hypothyroidism, unspecified: Secondary | ICD-10-CM | POA: Diagnosis not present

## 2022-07-18 DIAGNOSIS — E785 Hyperlipidemia, unspecified: Secondary | ICD-10-CM | POA: Diagnosis not present

## 2022-07-18 DIAGNOSIS — I69398 Other sequelae of cerebral infarction: Secondary | ICD-10-CM | POA: Diagnosis not present

## 2022-07-19 ENCOUNTER — Other Ambulatory Visit: Payer: Self-pay | Admitting: *Deleted

## 2022-07-19 NOTE — Patient Outreach (Signed)
Per [facility] Mrs. Coach admitted to [facility] on 07/16/22.  Screening for potential [facility] care coordination services as benefit of insurance plan and PCP.   Chart reviewed. Noted Mrs. Baine has been active with [facility] care coordination team. Will follow while Mrs.Coachman resides in SNF.   Will communicate with [facility] care coordination team.   Marthenia Rolling, MSN, RN,BSN [place] Acute Care Coordinator [phone] (Direct dial)

## 2022-07-22 ENCOUNTER — Telehealth: Payer: Self-pay | Admitting: Cardiovascular Disease

## 2022-07-22 DIAGNOSIS — I5032 Chronic diastolic (congestive) heart failure: Secondary | ICD-10-CM | POA: Diagnosis not present

## 2022-07-22 DIAGNOSIS — H3530 Unspecified macular degeneration: Secondary | ICD-10-CM | POA: Diagnosis not present

## 2022-07-22 DIAGNOSIS — I69398 Other sequelae of cerebral infarction: Secondary | ICD-10-CM | POA: Diagnosis not present

## 2022-07-22 DIAGNOSIS — E785 Hyperlipidemia, unspecified: Secondary | ICD-10-CM | POA: Diagnosis not present

## 2022-07-22 DIAGNOSIS — I482 Chronic atrial fibrillation, unspecified: Secondary | ICD-10-CM | POA: Diagnosis not present

## 2022-07-22 DIAGNOSIS — I10 Essential (primary) hypertension: Secondary | ICD-10-CM | POA: Diagnosis not present

## 2022-07-22 DIAGNOSIS — E039 Hypothyroidism, unspecified: Secondary | ICD-10-CM | POA: Diagnosis not present

## 2022-07-22 DIAGNOSIS — I2510 Atherosclerotic heart disease of native coronary artery without angina pectoris: Secondary | ICD-10-CM | POA: Diagnosis not present

## 2022-07-22 MED ORDER — FUROSEMIDE 40 MG PO TABS: 40.0000 mg | ORAL_TABLET | Freq: Every day | ORAL | 3 refills | Status: DC

## 2022-07-22 NOTE — Telephone Encounter (Signed)
  Pt c/o medication issue:  1. Name of Medication: furosemide   2. How are you currently taking this medication (dosage and times per day)? Taking 40 mg in the morning and 25 mg in the evening   3. Are you having a reaction (difficulty breathing--STAT)? No   4. What is your medication issue? Pt said, she is residing at [facility] and they are not giving er furosemide. She said, she's been taking it for years and she feels like she is retaining fluid since she has not been taking it. She would like for Dr. Kathalene Frames nurse to call [facility]'s nurse to order furosemide or call her daughter. She doesn't know her direct number to call at [facility] or the direct number of her nurse.  Furosimide is not on her med list

## 2022-07-22 NOTE — Telephone Encounter (Signed)
Faxed over order information to [facility]-   I called daughter and advised of information below.  Patient daughter verbalized understanding, thankful for call back.   Added medication back to medication list.

## 2022-07-22 NOTE — Telephone Encounter (Signed)
After review of chart, patient recent ED visit 11/27- they removed the following meds:  STOP taking these medications     CALCIUM 600+D PO    furosemide 40 MG tablet Commonly known as: LASIX    MAGNESIUM OXIDE 400 PO    potassium chloride 10 MEQ tablet Commonly known as: KLOR-CON M    PRESERVISION AREDS 2 PO    temazepam 15 MG capsule Commonly known as: RESTORIL    Vitamin D 50 MCG (2000 UT) tablet    I did speak with daughter, she states she is having abdominal swelling (like she feels she is retaining fluid). She is unsure of weight gain, or any SOB, only states she feels "full". I did advise I would send to Dr.O'Neal to review last ED visit notes, and we would call them back with an update.  Daughter verbalized understanding.

## 2022-07-23 ENCOUNTER — Encounter: Payer: Self-pay | Admitting: Cardiovascular Disease

## 2022-07-23 DIAGNOSIS — I639 Cerebral infarction, unspecified: Secondary | ICD-10-CM | POA: Diagnosis not present

## 2022-07-23 DIAGNOSIS — I10 Essential (primary) hypertension: Secondary | ICD-10-CM | POA: Diagnosis not present

## 2022-07-23 DIAGNOSIS — I4891 Unspecified atrial fibrillation: Secondary | ICD-10-CM | POA: Diagnosis not present

## 2022-07-23 DIAGNOSIS — K219 Gastro-esophageal reflux disease without esophagitis: Secondary | ICD-10-CM | POA: Diagnosis not present

## 2022-07-23 NOTE — Telephone Encounter (Signed)
Error

## 2022-07-24 ENCOUNTER — Other Ambulatory Visit: Payer: Self-pay | Admitting: *Deleted

## 2022-07-24 NOTE — Patient Outreach (Signed)
[place] Coordinator follow up. Kristen Whitehead currently resides in [facility].  Kristen Whitehead has been engaged with [facility] care coordination.  Update received from [facility] social worker indicating transition plan is to go home with granddaughter in [place], [place] post SNF.   Will continue to follow. Will update [facility] care coordination team.   Marthenia Rolling, MSN, RN,BSN [place] Acute Care Coordinator [phone] (Direct dial)

## 2022-07-25 DIAGNOSIS — E785 Hyperlipidemia, unspecified: Secondary | ICD-10-CM | POA: Diagnosis not present

## 2022-07-25 DIAGNOSIS — I69398 Other sequelae of cerebral infarction: Secondary | ICD-10-CM | POA: Diagnosis not present

## 2022-07-25 DIAGNOSIS — H3530 Unspecified macular degeneration: Secondary | ICD-10-CM | POA: Diagnosis not present

## 2022-07-25 DIAGNOSIS — E039 Hypothyroidism, unspecified: Secondary | ICD-10-CM | POA: Diagnosis not present

## 2022-07-25 DIAGNOSIS — I482 Chronic atrial fibrillation, unspecified: Secondary | ICD-10-CM | POA: Diagnosis not present

## 2022-07-25 DIAGNOSIS — I10 Essential (primary) hypertension: Secondary | ICD-10-CM | POA: Diagnosis not present

## 2022-07-25 DIAGNOSIS — I2510 Atherosclerotic heart disease of native coronary artery without angina pectoris: Secondary | ICD-10-CM | POA: Diagnosis not present

## 2022-07-25 DIAGNOSIS — I5032 Chronic diastolic (congestive) heart failure: Secondary | ICD-10-CM | POA: Diagnosis not present

## 2022-07-26 ENCOUNTER — Ambulatory Visit: Payer: Medicare Other | Admitting: Cardiovascular Disease

## 2022-07-29 DIAGNOSIS — I639 Cerebral infarction, unspecified: Secondary | ICD-10-CM | POA: Diagnosis not present

## 2022-07-29 DIAGNOSIS — I482 Chronic atrial fibrillation, unspecified: Secondary | ICD-10-CM | POA: Diagnosis not present

## 2022-07-29 DIAGNOSIS — I2510 Atherosclerotic heart disease of native coronary artery without angina pectoris: Secondary | ICD-10-CM | POA: Diagnosis not present

## 2022-07-29 DIAGNOSIS — K219 Gastro-esophageal reflux disease without esophagitis: Secondary | ICD-10-CM | POA: Diagnosis not present

## 2022-07-29 DIAGNOSIS — I69398 Other sequelae of cerebral infarction: Secondary | ICD-10-CM | POA: Diagnosis not present

## 2022-07-29 DIAGNOSIS — E039 Hypothyroidism, unspecified: Secondary | ICD-10-CM | POA: Diagnosis not present

## 2022-07-29 DIAGNOSIS — E785 Hyperlipidemia, unspecified: Secondary | ICD-10-CM | POA: Diagnosis not present

## 2022-07-29 DIAGNOSIS — I5032 Chronic diastolic (congestive) heart failure: Secondary | ICD-10-CM | POA: Diagnosis not present

## 2022-07-29 DIAGNOSIS — M545 Low back pain, unspecified: Secondary | ICD-10-CM | POA: Diagnosis not present

## 2022-07-29 DIAGNOSIS — H3530 Unspecified macular degeneration: Secondary | ICD-10-CM | POA: Diagnosis not present

## 2022-07-29 DIAGNOSIS — I10 Essential (primary) hypertension: Secondary | ICD-10-CM | POA: Diagnosis not present

## 2022-07-31 ENCOUNTER — Other Ambulatory Visit: Payer: Self-pay | Admitting: *Deleted

## 2022-07-31 NOTE — Patient Outreach (Signed)
[place] Coordinator follow up. Kristen Whitehead resides in [facility]. [place] team has made several outreaches in the recent past.   Facility site visit to [facility] today. Spoke with Kristen Whitehead, SNF social worker who reports she will discharge home on Thursday morning to live with granddaughter in [place], [place]. Will have [facility] for PT/ nursing aide services.    Spoke with Kristen Whitehead at bedside. She confirms she is going to [place], [place] to live with her granddaughter for a while. Not sure if it will be permanent. Discussed Probation officer will make [facility] care coordination team aware. Encouraged Kristen Whitehead to follow up with PCP. States she plans on it and also plans on following up with her cardiologist.   Provided [place] Management brochure along with writer's contact information.   Kristen Whitehead states granddaughter Kristen Whitehead telephone number is [phone].   Will update [facility] care coordination team.   Marthenia Rolling, MSN, RN,BSN [place] Acute Care Coordinator [phone] (Direct dial)

## 2022-08-01 ENCOUNTER — Telehealth: Payer: Self-pay | Admitting: Cardiovascular Disease

## 2022-08-01 ENCOUNTER — Telehealth: Payer: Self-pay

## 2022-08-01 ENCOUNTER — Ambulatory Visit: Payer: Medicare Other | Admitting: Orthopedic Surgery

## 2022-08-01 DIAGNOSIS — I5032 Chronic diastolic (congestive) heart failure: Secondary | ICD-10-CM | POA: Diagnosis not present

## 2022-08-01 DIAGNOSIS — R2681 Unsteadiness on feet: Secondary | ICD-10-CM | POA: Diagnosis not present

## 2022-08-01 DIAGNOSIS — R2689 Other abnormalities of gait and mobility: Secondary | ICD-10-CM | POA: Diagnosis not present

## 2022-08-01 DIAGNOSIS — I10 Essential (primary) hypertension: Secondary | ICD-10-CM | POA: Diagnosis not present

## 2022-08-01 DIAGNOSIS — M6281 Muscle weakness (generalized): Secondary | ICD-10-CM | POA: Diagnosis not present

## 2022-08-01 DIAGNOSIS — E785 Hyperlipidemia, unspecified: Secondary | ICD-10-CM | POA: Diagnosis not present

## 2022-08-01 DIAGNOSIS — E039 Hypothyroidism, unspecified: Secondary | ICD-10-CM | POA: Diagnosis not present

## 2022-08-01 DIAGNOSIS — I4821 Permanent atrial fibrillation: Secondary | ICD-10-CM

## 2022-08-01 DIAGNOSIS — I2510 Atherosclerotic heart disease of native coronary artery without angina pectoris: Secondary | ICD-10-CM | POA: Diagnosis not present

## 2022-08-01 DIAGNOSIS — I482 Chronic atrial fibrillation, unspecified: Secondary | ICD-10-CM | POA: Diagnosis not present

## 2022-08-01 DIAGNOSIS — I69398 Other sequelae of cerebral infarction: Secondary | ICD-10-CM | POA: Diagnosis not present

## 2022-08-01 DIAGNOSIS — H3530 Unspecified macular degeneration: Secondary | ICD-10-CM | POA: Diagnosis not present

## 2022-08-01 MED ORDER — APIXABAN 2.5 MG PO TABS: 2.5000 mg | ORAL_TABLET | Freq: Two times a day (BID) | ORAL | 1 refills | Status: DC

## 2022-08-01 MED ORDER — LEVOTHYROXINE SODIUM 75 MCG PO TABS: 75.0000 ug | ORAL_TABLET | Freq: Every day | ORAL | 0 refills | Status: DC

## 2022-08-01 NOTE — Telephone Encounter (Signed)
*  STAT* If patient is at the pharmacy, call can be transferred to refill team.   1. Which medications need to be refilled? (please list name of each medication and dose if known)  apixaban (ELIQUIS) 2.5 MG TABS tablet  ezetimibe (ZETIA) 10 MG tablet  furosemide (LASIX) 40 MG tablet   2. Which pharmacy/location (including street and city if local pharmacy) is medication to be sent to? [place], [place] - [address] AT [place]   3. Do they need a 30 day or 90 day supply? 90 day  Patient moving to [place] today so needs prescription today. She will still continue to see Dr. Audie Box

## 2022-08-01 NOTE — Telephone Encounter (Signed)
Prescription refill request for Eliquis received. Indication:Afib  Last office visit: 07/03/22 Scr: 0.77 (07/15/22)  Age: 88 Weight: 57kg  Per office note on 07/03/22, Eliquis was discontinued; however Eliquis was restarted in hospital. Okay to refill per Karren Cobble, PharmD. Refill sent to requested pharmacy.

## 2022-08-01 NOTE — Telephone Encounter (Signed)
Patient called requesting refill of medication so she can get it before she leaves to go out of town to [place]

## 2022-08-02 ENCOUNTER — Telehealth: Payer: Self-pay | Admitting: Cardiovascular Disease

## 2022-08-02 DIAGNOSIS — I69322 Dysarthria following cerebral infarction: Secondary | ICD-10-CM | POA: Diagnosis not present

## 2022-08-02 DIAGNOSIS — R531 Weakness: Secondary | ICD-10-CM | POA: Diagnosis not present

## 2022-08-02 DIAGNOSIS — R42 Dizziness and giddiness: Secondary | ICD-10-CM | POA: Diagnosis not present

## 2022-08-02 DIAGNOSIS — H532 Diplopia: Secondary | ICD-10-CM | POA: Diagnosis not present

## 2022-08-02 DIAGNOSIS — I69398 Other sequelae of cerebral infarction: Secondary | ICD-10-CM | POA: Diagnosis not present

## 2022-08-02 DIAGNOSIS — I482 Chronic atrial fibrillation, unspecified: Secondary | ICD-10-CM | POA: Diagnosis not present

## 2022-08-02 DIAGNOSIS — Z7901 Long term (current) use of anticoagulants: Secondary | ICD-10-CM | POA: Diagnosis not present

## 2022-08-02 DIAGNOSIS — I69391 Dysphagia following cerebral infarction: Secondary | ICD-10-CM | POA: Diagnosis not present

## 2022-08-02 DIAGNOSIS — I509 Heart failure, unspecified: Secondary | ICD-10-CM | POA: Diagnosis not present

## 2022-08-02 DIAGNOSIS — R2689 Other abnormalities of gait and mobility: Secondary | ICD-10-CM | POA: Diagnosis not present

## 2022-08-02 DIAGNOSIS — Z9181 History of falling: Secondary | ICD-10-CM | POA: Diagnosis not present

## 2022-08-02 DIAGNOSIS — I110 Hypertensive heart disease with heart failure: Secondary | ICD-10-CM | POA: Diagnosis not present

## 2022-08-02 MED ORDER — FUROSEMIDE 40 MG PO TABS: 40.0000 mg | ORAL_TABLET | Freq: Every day | ORAL | 3 refills | Status: DC

## 2022-08-02 MED ORDER — EZETIMIBE 10 MG PO TABS: 10.0000 mg | ORAL_TABLET | Freq: Every day | ORAL | 10 refills | Status: DC

## 2022-08-02 NOTE — Addendum Note (Signed)
Addended by: Lubertha Sayres on: 08/02/2022 02:01 PM   Modules accepted: Orders

## 2022-08-02 NOTE — Telephone Encounter (Signed)
Patient calling with question bout being on the blood thinner. Please advise

## 2022-08-02 NOTE — Telephone Encounter (Signed)
Patient wanted to know if she should be taking eliquis 2.'5mg'$  twice daily. On 11/15 eliquis was discontinued. On 11/20, she went to the ED with intermittent nausea, generalized weakness, episode of aphasia earlier and diplopia. Per D/C summary, Stroke: Small acute infarcts in left midbrain and right medial pons due to basilar tip thrombus, likely cardioembolic in setting of atrial fibrillation off anticoagulation. Was placed on apixaban 2.5 mg twice a day.   Patient instructed to continue all medications as prescribed at time of discharge from hospital. She has F/U with Arnold Long on 12/22. Patient verbalized understanding. She also asked for samples of eliquis 2.5 mg twice daily. Four boxes set aside for patient. Lot #: [id number]; Exp: 4/24. Recommended that on the day patient comes for appointment, to wait until seen by practitioner before getting samples, in case dose of eliquis changes. She verbalized understanding.

## 2022-08-05 DIAGNOSIS — H532 Diplopia: Secondary | ICD-10-CM | POA: Diagnosis not present

## 2022-08-05 DIAGNOSIS — I509 Heart failure, unspecified: Secondary | ICD-10-CM | POA: Diagnosis not present

## 2022-08-05 DIAGNOSIS — I69391 Dysphagia following cerebral infarction: Secondary | ICD-10-CM | POA: Diagnosis not present

## 2022-08-05 DIAGNOSIS — Z9181 History of falling: Secondary | ICD-10-CM | POA: Diagnosis not present

## 2022-08-05 DIAGNOSIS — R42 Dizziness and giddiness: Secondary | ICD-10-CM | POA: Diagnosis not present

## 2022-08-05 DIAGNOSIS — I482 Chronic atrial fibrillation, unspecified: Secondary | ICD-10-CM | POA: Diagnosis not present

## 2022-08-05 DIAGNOSIS — I110 Hypertensive heart disease with heart failure: Secondary | ICD-10-CM | POA: Diagnosis not present

## 2022-08-05 DIAGNOSIS — Z7901 Long term (current) use of anticoagulants: Secondary | ICD-10-CM | POA: Diagnosis not present

## 2022-08-05 DIAGNOSIS — I69322 Dysarthria following cerebral infarction: Secondary | ICD-10-CM | POA: Diagnosis not present

## 2022-08-05 DIAGNOSIS — R2689 Other abnormalities of gait and mobility: Secondary | ICD-10-CM | POA: Diagnosis not present

## 2022-08-05 DIAGNOSIS — I69398 Other sequelae of cerebral infarction: Secondary | ICD-10-CM | POA: Diagnosis not present

## 2022-08-05 DIAGNOSIS — R531 Weakness: Secondary | ICD-10-CM | POA: Diagnosis not present

## 2022-08-05 NOTE — Telephone Encounter (Signed)
Called patient, advised of response below.  Left call back number if questions/concerns.

## 2022-08-06 DIAGNOSIS — Z7901 Long term (current) use of anticoagulants: Secondary | ICD-10-CM | POA: Diagnosis not present

## 2022-08-06 DIAGNOSIS — Z9181 History of falling: Secondary | ICD-10-CM | POA: Diagnosis not present

## 2022-08-06 DIAGNOSIS — I69322 Dysarthria following cerebral infarction: Secondary | ICD-10-CM | POA: Diagnosis not present

## 2022-08-06 DIAGNOSIS — R2689 Other abnormalities of gait and mobility: Secondary | ICD-10-CM | POA: Diagnosis not present

## 2022-08-06 DIAGNOSIS — I482 Chronic atrial fibrillation, unspecified: Secondary | ICD-10-CM | POA: Diagnosis not present

## 2022-08-06 DIAGNOSIS — I69391 Dysphagia following cerebral infarction: Secondary | ICD-10-CM | POA: Diagnosis not present

## 2022-08-06 DIAGNOSIS — R42 Dizziness and giddiness: Secondary | ICD-10-CM | POA: Diagnosis not present

## 2022-08-06 DIAGNOSIS — I69398 Other sequelae of cerebral infarction: Secondary | ICD-10-CM | POA: Diagnosis not present

## 2022-08-06 DIAGNOSIS — I509 Heart failure, unspecified: Secondary | ICD-10-CM | POA: Diagnosis not present

## 2022-08-06 DIAGNOSIS — H532 Diplopia: Secondary | ICD-10-CM | POA: Diagnosis not present

## 2022-08-06 DIAGNOSIS — R531 Weakness: Secondary | ICD-10-CM | POA: Diagnosis not present

## 2022-08-06 DIAGNOSIS — I110 Hypertensive heart disease with heart failure: Secondary | ICD-10-CM | POA: Diagnosis not present

## 2022-08-06 NOTE — Progress Notes (Signed)
Cardiology Clinic Note   Patient Name: Kristen Whitehead Date of Encounter: 08/09/2022  Primary Care Provider:  [name], NP Primary Cardiologist:  Evalina Field, MD  Patient Profile    Kristen Whitehead is a 88 year old female with history of chronic atrial fibrillation, who originally was not on anticoagulation due to history of multiple falls, hypercholesterolemia, hypertension, hypothyroidism, GERD, and macular degeneration of eyes bilaterally with the left side worse than the right.    She was recently admitted on 07/08/2022 due to severe dizziness, nausea and generalized weakness.  With intermittent periods of garbled speech.  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  Setting of small infarcts of the left midbrain and right medial pons due to basilar tip thrombus, likely cardiometabolic in the setting of atrial fibrillation.  Prior to discharge the patient was restarted on Eliquis 2.5 mg twice daily.  This was restarted as the benefit was greater than the risk of recurrent embolic strokes.  Past Medical History    Past Medical History:  Diagnosis Date   Bladder prolapse, female, acquired    Cancer [facility])    thyroid   Chest pain    no known ischemic heart disease; negative Myoview July 2013. EF 75% with no ischemia.    Chronic anticoagulation    Chronic atrial fibrillation (HCC)    managed with rate control and coumadin   Chronic diastolic heart failure (HCC)    GERD (gastroesophageal reflux disease)    Heart disease    History of congenital mitral regurgitation    mild   History of heart attack    Multiple    History of mitral valve prolapse 06/15/2008   a. echo 1/14: mild LVH, EF 60-65%, mild MR, mild to mod BAE, PASP 35   Hypercholesterolemia    Hypertension    Hypothyroidism     Past Surgical History:  Procedure Laterality Date   ABDOMINAL HYSTERECTOMY     CATARACT EXTRACTION     CHOLECYSTECTOMY     Thyroidectomy     TONSILLECTOMY      Allergies  Allergies  Allergen Reactions   Azithromycin Nausea Only and Other (See Comments)   Compazine [Prochlorperazine Edisylate] Nausea Only   Demerol Nausea Only   Dolophine [Methadone] Nausea Only   Ebastine Nausea Only    EBS   Erythromycin Nausea Only and Other (See Comments)   Gluten Meal Diarrhea   Ibuprofen Nausea Only and Other (See Comments)   Meperidine Hcl Other (See Comments)   Morphine Sulfate Other (See Comments)   Statins Other (See Comments)    myalgias   Tetanus Toxoids Nausea Only   Tetracycline Other (See Comments)   Tetracyclines & Related Nausea Only    History of Present Illness    Kristen Whitehead presents today for ongoing assessment and management of atrial fibrillation, hyperlipidemia,, hypertension, and was recently hospitalized in the setting of 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  She was taken off of Eliquis due to frailty and frequent falls but recently restarted on 2.5 mg twice daily as the benefit outweighed the risk of recurrent thrombus strokes.  She is intolerant of statins, but was started on Zetia 10 mg daily as well on discharge.  She comes today with her granddaughter, Abigail Butts, with who she  lives with now, since leaving the SNF.  They live in [place].  She complaints of multiple medication changes and confusion concerning what she is taking. She is undergoing home PT since leaving the SNF. She also has some complaints of chronic lower extremity edema.  She denies pain, dizziness, DOE, or near syncope.   Home Medications    Current Outpatient Medications  Medication Sig Dispense Refill   amLODipine (NORVASC) 2.5 MG tablet TAKE 2 TABLETS BY MOUTH  DAILY 180 tablet 3   apixaban (ELIQUIS) 2.5 MG TABS tablet Take 1 tablet  (2.5 mg total) by mouth 2 (two) times daily. 180 tablet 1   atenolol (TENORMIN) 25 MG tablet Take 1 tablet (25 mg total) by mouth 2 (two) times daily. 60 tablet 0   dorzolamide (TRUSOPT) 2 % ophthalmic solution Place 1 drop into the left eye 2 (two) times daily.     ezetimibe (ZETIA) 10 MG tablet Take 1 tablet (10 mg total) by mouth daily. 30 tablet 10   furosemide (LASIX) 40 MG tablet Take 1 tablet (40 mg total) by mouth daily. 90 tablet 3   latanoprost (XALATAN) 0.005 % ophthalmic solution Place 1 drop into both eyes at bedtime.      levothyroxine (SYNTHROID) 75 MCG tablet Take 1 tablet (75 mcg total) by mouth daily at 6 (six) AM. 90 tablet 0   Multiple Vitamin (MULTIVITAMIN WITH MINERALS) TABS tablet Take 1 tablet by mouth daily. 30 tablet 0   nitroGLYCERIN (NITROSTAT) 0.4 MG SL tablet DISSOLVE 1 TABLET UNDER THE TONGUE EVERY 5 MINUTES FOR 3 DOSES AS NEEDED  FOR  CHEST  PAIN (Patient taking differently: Place 0.4 mg under the tongue every 5 (five) minutes as needed for chest pain.) 50 tablet 2   omeprazole (PRILOSEC) 20 MG capsule Take 20 mg by mouth daily as needed (acid reflux).     pramipexole (MIRAPEX) 0.125 MG tablet TAKE 1 TABLET BY MOUTH AT  BEDTIME 90 tablet 3   PREMARIN vaginal cream Place 2 g vaginally 2 (two) times a week. Uses on Sunday and wednesday     Probiotic Product (PROBIOTIC DAILY PO) Take 1 tablet by mouth daily.     sodium chloride (MURO 128) 5 % ophthalmic solution Place 1 drop into the right eye at bedtime.      No current facility-administered medications for this visit.     Family History    Family History  Problem Relation Age of Onset   Stroke Mother 70   Stroke Father 69   Arthritis Daughter    Gout Daughter    Immunocompromised Granddaughter    Heart attack Neg Hx    Heart disease Neg Hx    She indicated that her mother is deceased. She indicated that her father is deceased. She indicated that her maternal grandmother is deceased. She indicated that her  maternal grandfather is deceased. She indicated that her paternal grandmother is deceased. She indicated that her paternal grandfather is deceased. She indicated that both of her daughters are alive. She indicated that her son is alive. She indicated that the status of her neg hx is unknown. She indicated that her granddaughter is alive.  Social History    Social History   Socioeconomic History   Marital status: Widowed    Spouse name: Not on file   Number of children: 3   Years of education: BA   Highest education level: Not on file  Occupational History    Employer: RETIRED   Occupation: Retired  [employer]  Tobacco Use   Smoking status: Never   Smokeless tobacco: Never  Vaping Use   Vaping Use: Never used  Substance and Sexual Activity   Alcohol use: No   Drug use: No   Sexual activity: Not Currently  Other Topics Concern   Not on file  Social History Narrative   Pt lives at home alone.   Caffeine Use: quit 80yr ago      Diet: No FMaceo Pro     Do you drink/ eat things with caffeineNo      Marital status:   Widowed                            What year were you married ? 1947      Do you live in a house, apartment,assistred living, condo, trailer, etc.)[place]     Is it one or more stories? 1      How many persons live in your home ? Me      Do you have any pets in your home ?(please list) No      Highest Level of education completed:  BA [employer]     Current or past profession:  TPharmacist, hospital Office, [employer]      Do you exercise?  No                            Type & how often       ADVANCED DIRECTIVES (Please bring copies)      Do you have a living will? Yes      Do you have a DNR form?   Yes                    If not, do you want to discuss one?       Do you have signed POA?HPOA forms?  Yes               If so, please bring to your appointment      FUNCTIONAL STATUS- To be completed by Spouse / child / Staff       Do you have difficulty  bathing or dressing yourself ?  No      Do you have difficulty preparing food or eating ?  No      Do you have difficulty managing your mediation ?  No      Do you have difficulty managing your finances ?  No      Do you have difficulty affording your medication ?  No      Social Determinants of Health   Financial Resource Strain: Low Risk  (05/06/2022)   Overall Financial Resource Strain (CARDIA)    Difficulty of Paying Living Expenses: Not hard at all  Food Insecurity: No Food Insecurity (05/06/2022)   Hunger Vital Sign    Worried About Running Out of Food in the Last Year: Never true    Ran Out of Food in the Last Year: Never true  Transportation Needs: No Transportation Needs (05/06/2022)   PRAPARE - THydrologist(Medical): No    Lack of Transportation (Non-Medical): No  Physical Activity: Insufficiently Active (05/06/2022)   Exercise Vital Sign    Days of Exercise per Week: 2 days    Minutes of Exercise per Session: 10 min  Stress: No  Stress Concern Present (05/06/2022)   [place]    Feeling of Stress : Not at all  Social Connections: Moderately Integrated (05/06/2022)   Social Connection and Isolation Panel [NHANES]    Frequency of Communication with Friends and Family: Three times a week    Frequency of Social Gatherings with Friends and Family: Three times a week    Attends Religious Services: 1 to 4 times per year    Active Member of Clubs or Organizations: Yes    Attends Archivist Meetings: More than 4 times per year    Marital Status: Widowed  Intimate Partner Violence: Not At Risk (08/07/2021)   Humiliation, Afraid, Rape, and Kick questionnaire    Fear of Current or Ex-Partner: No    Emotionally Abused: No    Physically Abused: No    Sexually Abused: No     Review of Systems    General:  No chills, fever, night sweats or weight changes.  Cardiovascular:   No chest pain, dyspnea on exertion, edema, orthopnea, palpitations, paroxysmal nocturnal dyspnea. Dermatological: No rash, lesions/masses Respiratory: No cough, dyspnea Urologic: No hematuria, dysuria Abdominal:   No nausea, vomiting, diarrhea, bright red blood per rectum, melena, or hematemesis Neurologic:  No visual changes, wkns, changes in mental status. All other systems reviewed and are otherwise negative except as noted above.     Physical Exam    VS:  BP 105/65 (BP Location: Right Arm, Cuff Size: Small)   Pulse 78   Ht '4\' 11"'$  (1.499 m)   Wt 128 lb (58.1 kg)   SpO2 96%   BMI 25.85 kg/m  , BMI Body mass index is 25.85 kg/m.     GEN: Well nourished, well developed, in no acute distress sitting in a wheel chair.  HEENT: normal. Neck: Supple, no JVD, carotid bruits, or masses. Cardiac: RRR, irregular, no murmurs, rubs, or gallops. No clubbing, cyanosis, mild 1+ dependent edema.  Radials/DP/PT 2+ and equal bilaterally.  Respiratory:  Respirations regular and unlabored, clear to auscultation bilaterally. GI: Soft, nontender, nondistended, BS + x 4. MS: no deformity or atrophy. Skin: warm and dry, no rash. Neuro:  Strength and sensation are intact. Psych: Normal affect.  Accessory Clinical Findings    ECG personally reviewed by me today- Not completed today.   Lab Results  Component Value Date   WBC 6.9 07/15/2022   HGB 13.0 07/15/2022   HCT 37.4 07/15/2022   MCV 94.9 07/15/2022   PLT 168 07/15/2022   Lab Results  Component Value Date   CREATININE 0.77 07/15/2022   BUN 16 07/15/2022   NA 136 07/15/2022   K 4.1 07/15/2022   CL 104 07/15/2022   CO2 24 07/15/2022   Lab Results  Component Value Date   ALT 15 01/31/2022   AST 23 01/31/2022   ALKPHOS 134 (H) 12/09/2019   BILITOT 0.5 01/31/2022   Lab Results  Component Value Date   CHOL 207 (H) 07/09/2022   HDL 82 07/09/2022   LDLCALC 108 (H) 07/09/2022   LDLDIRECT 98.8 11/04/2012   TRIG 84 07/09/2022    CHOLHDL 2.5 07/09/2022    Lab Results  Component Value Date   HGBA1C 5.8 (H) 07/09/2022    Review of Prior Studies: Echocardiogram 07/09/2022 1. Left ventricular ejection fraction, by estimation, is 60 to 65%. The  left ventricle has normal function. The left ventricle has no regional  wall motion abnormalities. Left ventricular diastolic parameters are  indeterminate.  2. Right ventricular systolic function is normal. The right ventricular  size is normal. There is normal pulmonary artery systolic pressure. The  estimated right ventricular systolic pressure is 09.4 mmHg.   3. Left atrial size was moderately dilated.   4. Right atrial size was mildly dilated.   5. There is no evidence of cardiac tamponade.   6. 2D MVA 3.68 cm2.. The mitral valve is abnormal. Trivial mitral valve  regurgitation. Severe mitral annular calcification.   7. The aortic valve was not well visualized. Aortic valve regurgitation  is trivial.   MR Brain: 07/08/2022 1. Small acute infarcts in the left midbrain and right medial pons. 2. Loss of the distal basilar artery flow void, consistent with the occlusion seen on the same-day CTA.  CT Angio of Head and Neck 07/08/2022  1. 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 The right superior cerebellar artery is not opacified. The bilateral PCAs and left superior cerebellar artery are opacified, secondary to a patent left posterior communicating artery. 2. 45-50% stenosis in the proximal left subclavian artery. No additional hemodynamically significant stenosis in the neck. 3. No additional acute intracranial process. 4. Aortic atherosclerosis.  Assessment & Plan   1.  Hypertension:  She remains on low dose amlodipine 2.5 mg and atenolol 25 mg daily as well as lasix 40 mg daily. She has had orthostatics completed which were not significantly positive. I will continue current regimen for now, add compression socks that will help  with some of the lower extremity edema, which is mild. She is undergoing PT at home and will continue this for strengthening. If she becomes to hypotensive will consider decreasing lasix. She is not in favor of this as she states she swells up too much when she stops or lowers the dose in the past.  I have also recommended that she have her medications provided in a "blister pack" to assist with better administration and ease for her.  2. S/P CVA: Hx of small acute infarcts in the left midbrain and right medial pons due to basilar tip thrombus, likely cardioembolic.  She will remain on Eliquis 2.5 mg BID. She requests referral to neurologist for follow up as she has not seen one since her CVA in November.   3. Chronic atrial fibrillation: Heart rate is well controlled, and did not decrease or increase during her orthostatic BP review. Continue atenolol and Eliquis as directed.    4. Hypercholesterolemia: Continue Zetia. Follow LDL should be less than 70.   Current medicines are reviewed at length with the patient today.  I have spent 45 min's  dedicated to the care of this patient on the date of this encounter to include pre-visit review of records, assessment, management and diagnostic testing,with shared decision making.  Signed, Phill Myron. West Pugh, ANP, AACC   08/09/2022 10:28 AM      Office [phone] Fax [phone]  Notice: This dictation was prepared with Dragon dictation along with smaller phrase technology. Any transcriptional errors that result from this process are unintentional and may not be corrected upon review.

## 2022-08-07 DIAGNOSIS — I110 Hypertensive heart disease with heart failure: Secondary | ICD-10-CM | POA: Diagnosis not present

## 2022-08-07 DIAGNOSIS — I69322 Dysarthria following cerebral infarction: Secondary | ICD-10-CM | POA: Diagnosis not present

## 2022-08-07 DIAGNOSIS — I69398 Other sequelae of cerebral infarction: Secondary | ICD-10-CM | POA: Diagnosis not present

## 2022-08-07 DIAGNOSIS — I482 Chronic atrial fibrillation, unspecified: Secondary | ICD-10-CM | POA: Diagnosis not present

## 2022-08-07 DIAGNOSIS — Z7901 Long term (current) use of anticoagulants: Secondary | ICD-10-CM | POA: Diagnosis not present

## 2022-08-07 DIAGNOSIS — Z9181 History of falling: Secondary | ICD-10-CM | POA: Diagnosis not present

## 2022-08-07 DIAGNOSIS — H532 Diplopia: Secondary | ICD-10-CM | POA: Diagnosis not present

## 2022-08-07 DIAGNOSIS — I509 Heart failure, unspecified: Secondary | ICD-10-CM | POA: Diagnosis not present

## 2022-08-07 DIAGNOSIS — R42 Dizziness and giddiness: Secondary | ICD-10-CM | POA: Diagnosis not present

## 2022-08-07 DIAGNOSIS — I69391 Dysphagia following cerebral infarction: Secondary | ICD-10-CM | POA: Diagnosis not present

## 2022-08-07 DIAGNOSIS — R531 Weakness: Secondary | ICD-10-CM | POA: Diagnosis not present

## 2022-08-07 DIAGNOSIS — R2689 Other abnormalities of gait and mobility: Secondary | ICD-10-CM | POA: Diagnosis not present

## 2022-08-09 ENCOUNTER — Encounter: Payer: Self-pay | Admitting: Adult Health

## 2022-08-09 ENCOUNTER — Ambulatory Visit: Payer: Medicare Other | Attending: Adult Health | Admitting: Adult Health

## 2022-08-09 VITALS — BP 105/65 | HR 78 | Ht 59.0 in | Wt 128.0 lb

## 2022-08-09 DIAGNOSIS — I639 Cerebral infarction, unspecified: Secondary | ICD-10-CM

## 2022-08-09 DIAGNOSIS — E7800 Pure hypercholesterolemia, unspecified: Secondary | ICD-10-CM

## 2022-08-09 DIAGNOSIS — I110 Hypertensive heart disease with heart failure: Secondary | ICD-10-CM | POA: Diagnosis not present

## 2022-08-09 DIAGNOSIS — R531 Weakness: Secondary | ICD-10-CM | POA: Diagnosis not present

## 2022-08-09 DIAGNOSIS — I69398 Other sequelae of cerebral infarction: Secondary | ICD-10-CM | POA: Diagnosis not present

## 2022-08-09 DIAGNOSIS — I482 Chronic atrial fibrillation, unspecified: Secondary | ICD-10-CM | POA: Diagnosis not present

## 2022-08-09 DIAGNOSIS — I69391 Dysphagia following cerebral infarction: Secondary | ICD-10-CM | POA: Diagnosis not present

## 2022-08-09 DIAGNOSIS — I4819 Other persistent atrial fibrillation: Secondary | ICD-10-CM | POA: Diagnosis not present

## 2022-08-09 DIAGNOSIS — E039 Hypothyroidism, unspecified: Secondary | ICD-10-CM

## 2022-08-09 DIAGNOSIS — I10 Essential (primary) hypertension: Secondary | ICD-10-CM

## 2022-08-09 DIAGNOSIS — I69322 Dysarthria following cerebral infarction: Secondary | ICD-10-CM | POA: Diagnosis not present

## 2022-08-09 DIAGNOSIS — H532 Diplopia: Secondary | ICD-10-CM | POA: Diagnosis not present

## 2022-08-09 DIAGNOSIS — Z7901 Long term (current) use of anticoagulants: Secondary | ICD-10-CM | POA: Diagnosis not present

## 2022-08-09 DIAGNOSIS — I509 Heart failure, unspecified: Secondary | ICD-10-CM | POA: Diagnosis not present

## 2022-08-09 DIAGNOSIS — Z9181 History of falling: Secondary | ICD-10-CM | POA: Diagnosis not present

## 2022-08-09 DIAGNOSIS — R42 Dizziness and giddiness: Secondary | ICD-10-CM | POA: Diagnosis not present

## 2022-08-09 DIAGNOSIS — R2689 Other abnormalities of gait and mobility: Secondary | ICD-10-CM | POA: Diagnosis not present

## 2022-08-09 NOTE — Patient Instructions (Signed)
Medication Instructions:  No Changes *If you need a refill on your cardiac medications before your next appointment, please call your pharmacy*   Lab Work: No Labs If you have labs (blood work) drawn today and your tests are completely normal, you will receive your results only by: [place] (if you have MyChart) OR A paper copy in the mail If you have any lab test that is abnormal or we need to change your treatment, we will call you to review the results.   Testing/Procedures: No Testing   Follow-Up: At [facility], you and your health needs are our priority.  As part of our continuing mission to provide you with exceptional heart care, we have created designated Provider Care Teams.  These Care Teams include your primary Cardiologist (physician) and Advanced Practice Providers (APPs -  Physician Assistants and Nurse Practitioners) who all work together to provide you with the care you need, when you need it.  We recommend signing up for the patient portal called "MyChart".  Sign up information is provided on this After Visit Summary.  MyChart is used to connect with patients for Virtual Visits (Telemedicine).  Patients are able to view lab/test results, encounter notes, upcoming appointments, etc.  Non-urgent messages can be sent to your provider as well.   To learn more about what you can do with MyChart, go to [email or web address].    Your next appointment:   3 month(s)  The format for your next appointment:   In Person  Provider:   Evalina Field, MD

## 2022-08-13 ENCOUNTER — Ambulatory Visit: Payer: Self-pay

## 2022-08-13 NOTE — Patient Outreach (Signed)
  Care Coordination   08/13/2022 Name: Kristen Whitehead MRN: [id number] DOB: [birth year]   Care Coordination Outreach Attempts:  An unsuccessful telephone outreach was attempted for a scheduled appointment today.  Follow Up Plan:  Additional outreach attempts will be made to offer the patient care coordination information and services.   Encounter Outcome:  No Answer   Care Coordination Interventions:  No, not indicated    Barb Merino, RN, BSN, CCM Care Management Coordinator [facility] Care Management Direct Phone: [phone]

## 2022-08-14 ENCOUNTER — Telehealth: Payer: Self-pay | Admitting: Cardiovascular Disease

## 2022-08-14 ENCOUNTER — Telehealth: Payer: Self-pay

## 2022-08-14 NOTE — Telephone Encounter (Signed)
Pt c/o swelling: STAT is pt has developed SOB within 24 hours  How much weight have you gained and in what time span?  Patient has not weighed, she is unsure  If swelling, where is the swelling located?  Legs   Are you currently taking a fluid pill?  Yes but patient states since her stroke she has only been taking Furosemide once daily--she used to take 1.5 tabs daily  Are you currently SOB?  No   Do you have a log of your daily weights (if so, list)?   Have you gained 3 pounds in a day or 5 pounds in a week?   Have you traveled recently?  No

## 2022-08-14 NOTE — Telephone Encounter (Signed)
Spoke with Dr. Gwenlyn Found (DOD) regarding patient's leg edema. Order for patient to take lasix '40mg'$  twice daily for three days and then go back to '40mg'$  daily. Patient informed and verbalized understanding.

## 2022-08-14 NOTE — Telephone Encounter (Addendum)
Spoke with patient who reported that for the past 2 days, her legs from the knee down are swollen; the left more than the right, with pain in both legs/feet. She denies SOB. Does not wear her support stockings due to improper fit. Recommended she seek help at the medical supply store in [place]. Over the past month, her weight changed from 120 pounds to 126 pounds. She takes lasix '40mg'$  in the morning and wants to go back to taking another lasix '20mg'$  in the evening. Please advise.

## 2022-08-15 ENCOUNTER — Ambulatory Visit (INDEPENDENT_AMBULATORY_CARE_PROVIDER_SITE_OTHER): Payer: Medicare Other | Admitting: Orthopedic Surgery

## 2022-08-15 ENCOUNTER — Encounter: Payer: Self-pay | Admitting: Orthopedic Surgery

## 2022-08-15 VITALS — BP 116/70 | HR 79 | Temp 96.2°F | Ht 59.0 in | Wt 127.0 lb

## 2022-08-15 DIAGNOSIS — Z8673 Personal history of transient ischemic attack (TIA), and cerebral infarction without residual deficits: Secondary | ICD-10-CM

## 2022-08-15 DIAGNOSIS — R531 Weakness: Secondary | ICD-10-CM | POA: Diagnosis not present

## 2022-08-15 DIAGNOSIS — I5032 Chronic diastolic (congestive) heart failure: Secondary | ICD-10-CM

## 2022-08-15 DIAGNOSIS — I10 Essential (primary) hypertension: Secondary | ICD-10-CM

## 2022-08-15 DIAGNOSIS — R600 Localized edema: Secondary | ICD-10-CM

## 2022-08-15 DIAGNOSIS — I4821 Permanent atrial fibrillation: Secondary | ICD-10-CM | POA: Diagnosis not present

## 2022-08-15 NOTE — Patient Instructions (Addendum)
Continue furosemide twice daily x 3 days  May use compression stockings after  Weight yourself daily, if you are > 3 lbs in one day take extra fluid pill (furosemide)- if you have to take more than 3 days notify doctor

## 2022-08-15 NOTE — Progress Notes (Addendum)
Careteam: Patient Care Team: [name], NP as PCP - General (Adult Health Nurse Practitioner) O'Neal, Cassie Freer, MD as PCP - Cardiology (Cardiology) Rex Kras, Claudette Stapler, RN as [place] [facility] [place], Tillie Rung as [place], Cassie Freer, MD as Consulting Physician (Cardiology)  Seen by: [name], AGNP-C  PLACE OF SERVICE:  [place]  Advanced Directive information    Allergies  Allergen Reactions   Azithromycin Nausea Only and Other (See Comments)   Compazine [Prochlorperazine Edisylate] Nausea Only   Demerol Nausea Only   Dolophine [Methadone] Nausea Only   Ebastine Nausea Only    EBS   Erythromycin Nausea Only and Other (See Comments)   Gluten Meal Diarrhea   Ibuprofen Nausea Only and Other (See Comments)   Meperidine Hcl Other (See Comments)   Morphine Sulfate Other (See Comments)   Statins Other (See Comments)    myalgias   Tetanus Toxoids Nausea Only   Tetracycline Other (See Comments)   Tetracyclines & Related Nausea Only    Chief Complaint  Patient presents with   Medical Management of Chronic Issues    Patient presents today for a follow-up.     HPI: Patient is a 88 y.o. female seen today for medical management of chronic conditions.   Granddaughter present during encounter.   11/20 she felt weak, nauseous and worried about falling. Family noticed her speech was garbled and took her to [facility]. CT head without contrast  unremarkable. CT angio head and neck revealed thrombus to tip of basilar artery, bilateral PCA and left SCA. MRI brain revealed small acute infarcts in left midbrain and right medial pons. Thrombectomy deferred and she was placed on heparin gtt. She was discharged to [facility] for PT/OT/skilled nursing services. She eventually was discharged home. She ended up moving in with her granddaughter in [place]. She is upset she is not living on her own. She believes she can live by  herself with a life alert.   12/22 she saw cardiology. She was restarted on low dose Eliquis. She was also started on Zetia at hospitalization discharge.   She contacted cardiology yesterday due to increased leg swelling. Advised to take furosemide 40 mg BID x 3 days.   Hearing and eye slight have declined after stroke. She is scheduled to see specialist in 2024.   No recent falls. Ambualtes with walker.   She plans to find primary provider closer to her new home.    Review of Systems:  Review of Systems  Constitutional:  Negative for chills and fever.  HENT:  Positive for hearing loss. Negative for congestion.   Eyes:  Positive for blurred vision.  Respiratory:  Negative for cough, shortness of breath and wheezing.   Cardiovascular:  Positive for leg swelling. Negative for chest pain, palpitations and orthopnea.  Gastrointestinal:  Positive for constipation. Negative for abdominal pain, diarrhea, heartburn, nausea and vomiting.  Genitourinary:  Negative for dysuria.  Musculoskeletal:  Negative for falls and joint pain.  Neurological:  Positive for weakness. Negative for dizziness and headaches.  Psychiatric/Behavioral:  Negative for depression and memory loss. The patient is not nervous/anxious and does not have insomnia.     Past Medical History:  Diagnosis Date   Bladder prolapse, female, acquired    Cancer [facility])    thyroid   Chest pain    no known ischemic heart disease; negative Myoview July 2013. EF 75% with no ischemia.    Chronic anticoagulation  Chronic atrial fibrillation (HCC)    managed with rate control and coumadin   Chronic diastolic heart failure (HCC)    GERD (gastroesophageal reflux disease)    Heart disease    History of congenital mitral regurgitation    mild   History of heart attack    Multiple    History of mitral valve prolapse 06/15/2008   a. echo 1/14: mild LVH, EF 60-65%, mild MR, mild to mod BAE, PASP 35   Hypercholesterolemia     Hypertension    Hypothyroidism    Stroke [facility])    Past Surgical History:  Procedure Laterality Date   ABDOMINAL HYSTERECTOMY     CATARACT EXTRACTION     CHOLECYSTECTOMY     Thyroidectomy     TONSILLECTOMY     Social History:   reports that she has never smoked. She has never used smokeless tobacco. She reports that she does not drink alcohol and does not use drugs.  Family History  Problem Relation Age of Onset   Stroke Mother 13   Stroke Father 22   Arthritis Daughter    Gout Daughter    Immunocompromised Granddaughter    Heart attack Neg Hx    Heart disease Neg Hx     Medications: Patient's Medications  New Prescriptions   No medications on file  Previous Medications   AMLODIPINE (NORVASC) 2.5 MG TABLET    TAKE 2 TABLETS BY MOUTH  DAILY   APIXABAN (ELIQUIS) 2.5 MG TABS TABLET    Take 1 tablet (2.5 mg total) by mouth 2 (two) times daily.   ATENOLOL (TENORMIN) 25 MG TABLET    Take 1 tablet (25 mg total) by mouth 2 (two) times daily.   DORZOLAMIDE (TRUSOPT) 2 % OPHTHALMIC SOLUTION    Place 1 drop into the left eye 2 (two) times daily.   EZETIMIBE (ZETIA) 10 MG TABLET    Take 1 tablet (10 mg total) by mouth daily.   FUROSEMIDE (LASIX) 40 MG TABLET    Take 1 tablet (40 mg total) by mouth daily.   LATANOPROST (XALATAN) 0.005 % OPHTHALMIC SOLUTION    Place 1 drop into both eyes at bedtime.    LEVOTHYROXINE (SYNTHROID) 75 MCG TABLET    Take 1 tablet (75 mcg total) by mouth daily at 6 (six) AM.   MULTIPLE VITAMIN (MULTIVITAMIN WITH MINERALS) TABS TABLET    Take 1 tablet by mouth daily.   NITROGLYCERIN (NITROSTAT) 0.4 MG SL TABLET    DISSOLVE 1 TABLET UNDER THE TONGUE EVERY 5 MINUTES FOR 3 DOSES AS NEEDED  FOR  CHEST  PAIN   OMEPRAZOLE (PRILOSEC) 20 MG CAPSULE    Take 20 mg by mouth daily as needed (acid reflux).   PRAMIPEXOLE (MIRAPEX) 0.125 MG TABLET    TAKE 1 TABLET BY MOUTH AT  BEDTIME   PREMARIN VAGINAL CREAM    Place 2 g vaginally 2 (two) times a week. Uses on Sunday and  wednesday   PROBIOTIC PRODUCT (PROBIOTIC DAILY PO)    Take 1 tablet by mouth daily.   SODIUM CHLORIDE (MURO 128) 5 % OPHTHALMIC SOLUTION    Place 1 drop into the right eye at bedtime.   Modified Medications   No medications on file  Discontinued Medications   No medications on file    Physical Exam:  Vitals:   08/15/22 1048  BP: 116/70  Pulse: 79  Temp: (!) 96.2 F (35.7 C)  SpO2: 96%  Weight: 127 lb (57.6 kg)  Height: _0  (  1.499 m)   Body mass index is 25.65 kg/m. Wt Readings from Last 3 Encounters:  08/15/22 127 lb (57.6 kg)  08/09/22 128 lb (58.1 kg)  07/09/22 125 lb 10.6 oz (57 kg)    Physical Exam Vitals reviewed.  Constitutional:      General: She is not in acute distress. HENT:     Head: Normocephalic.  Eyes:     General:        Right eye: No discharge.        Left eye: No discharge.  Cardiovascular:     Rate and Rhythm: Normal rate. Rhythm irregular.     Pulses: Normal pulses.     Heart sounds: Normal heart sounds.  Pulmonary:     Effort: Pulmonary effort is normal. No respiratory distress.     Breath sounds: Normal breath sounds. No wheezing.  Abdominal:     General: Bowel sounds are normal. There is no distension.     Palpations: Abdomen is soft.     Tenderness: There is no abdominal tenderness.  Musculoskeletal:        General: Normal range of motion.     Cervical back: Neck supple.     Right lower leg: Edema present.     Left lower leg: Edema present.     Comments: Non pitting  Skin:    General: Skin is warm.     Capillary Refill: Capillary refill takes less than 2 seconds.  Neurological:     General: No focal deficit present.     Mental Status: She is alert and oriented to person, place, and time.     Motor: Weakness present.     Gait: Gait abnormal.     Comments: walker  Psychiatric:        Mood and Affect: Mood normal.        Behavior: Behavior normal.     Labs reviewed: Basic Metabolic Panel: Recent Labs    04/03/22 1114  05/20/22 1120 06/24/22 1624 07/08/22 1454 07/08/22 1738 07/08/22 2225 07/12/22 0239 07/13/22 0325 07/15/22 0333  NA  --   --    < >  --  139   < > 136 138 136  K  --   --    < >  --  3.5   < > 3.8 3.9 4.1  CL  --   --    < >  --  106   < > 105 106 104  CO2  --   --    < >  --  27   < > 20* 24 24  GLUCOSE  --   --    < >  --  125*   < > 100* 93 120*  BUN  --   --    < >  --  9   < > 11 7* 16  CREATININE  --   --    < >  --  0.65   < > 0.73 0.73 0.77  CALCIUM  --   --    < >  --  7.6*   < > 7.1* 7.5* 8.1*  MG  --   --   --   --  1.8  --   --   --   --   TSH 0.11* 17.58*  --  19.195*  --   --   --   --   --    < > = values in this interval not displayed.  Liver Function Tests: Recent Labs    08/23/21 1459 01/31/22 1051  AST 22 23  ALT 14 15  BILITOT 0.6 0.5  PROT 6.5 6.3   No results for input(s): "LIPASE", "AMYLASE" in the last 8760 hours. No results for input(s): "AMMONIA" in the last 8760 hours. CBC: Recent Labs    01/31/22 1051 06/24/22 1624 07/08/22 1448 07/10/22 0538 07/12/22 0239 07/13/22 0325 07/15/22 0333  WBC 5.4 6.5 7.3   < > 6.0 6.4 6.9  NEUTROABS 2,662 2,672 4.5  --   --   --   --   HGB 13.9 15.3 15.0   < > 13.7 13.9 13.0  HCT 42.8 44.7 45.3   < > 39.8 40.5 37.4  MCV 93.9 94.1 97.2   < > 94.8 94.8 94.9  PLT 217 238 230   < > 171 198 168   < > = values in this interval not displayed.   Lipid Panel: Recent Labs    07/09/22 0332  CHOL 207*  HDL 82  LDLCALC 108*  TRIG 84  CHOLHDL 2.5   TSH: Recent Labs    04/03/22 1114 05/20/22 1120 07/08/22 1454  TSH 0.11* 17.58* 19.195*   A1C: Lab Results  Component Value Date   HGBA1C 5.8 (H) 07/09/2022     Assessment/Plan 1. Essential hypertension - controlled  - h/o dizziness in past year - cont amlodipine and atenolol - CBC with Differential/Platelet - CMP with eGFR([facility])  2. Permanent atrial fibrillation (HCC) - HR < 100 - cont atenolol - cont Eliquis for clot prevention  3.  Chronic diastolic heart failure (HCC) - LVEF 60-65% 07/09/2022 - presents with BLE edema today> discussed with cardiologist 12/27 - denies sob or weight fluctuations - cont furosemide  4. Cerebrovascular accident (CVA) within last 3 months - 11/20 noted with weakness, nausea, garbled speech - CT head without contrast  unremarkable - CT angio head and neck revealed thrombus to tip of basilar artery, bilateral PCA and left SCA - MRI brain revealed small acute infarcts in left midbrain and right medial pons - thrombectomy deferred and placed on heparin gtt - now on Zetia - Low dose Eliquis restarted 12/22  5. Generalized weakness - improved - discharged to SNF after hospitalization - ambulates with walker  6. Bilateral leg edema - see above  Total time: 38 minutes. Greater than 50% of total time spent doing patient education regarding CVA, HTN, leg edema, afib, and falls prevention.   Next appt: Visit date not found  [place], [facility] [phone]

## 2022-08-16 DIAGNOSIS — R531 Weakness: Secondary | ICD-10-CM | POA: Diagnosis not present

## 2022-08-16 DIAGNOSIS — Z9181 History of falling: Secondary | ICD-10-CM | POA: Diagnosis not present

## 2022-08-16 DIAGNOSIS — Z7901 Long term (current) use of anticoagulants: Secondary | ICD-10-CM | POA: Diagnosis not present

## 2022-08-16 DIAGNOSIS — R42 Dizziness and giddiness: Secondary | ICD-10-CM | POA: Diagnosis not present

## 2022-08-16 DIAGNOSIS — R2689 Other abnormalities of gait and mobility: Secondary | ICD-10-CM | POA: Diagnosis not present

## 2022-08-16 DIAGNOSIS — I69398 Other sequelae of cerebral infarction: Secondary | ICD-10-CM | POA: Diagnosis not present

## 2022-08-16 DIAGNOSIS — I110 Hypertensive heart disease with heart failure: Secondary | ICD-10-CM | POA: Diagnosis not present

## 2022-08-16 DIAGNOSIS — I69322 Dysarthria following cerebral infarction: Secondary | ICD-10-CM | POA: Diagnosis not present

## 2022-08-16 DIAGNOSIS — H532 Diplopia: Secondary | ICD-10-CM | POA: Diagnosis not present

## 2022-08-16 DIAGNOSIS — I69391 Dysphagia following cerebral infarction: Secondary | ICD-10-CM | POA: Diagnosis not present

## 2022-08-16 DIAGNOSIS — I482 Chronic atrial fibrillation, unspecified: Secondary | ICD-10-CM | POA: Diagnosis not present

## 2022-08-16 DIAGNOSIS — I509 Heart failure, unspecified: Secondary | ICD-10-CM | POA: Diagnosis not present

## 2022-08-16 LAB — CBC WITH DIFFERENTIAL/PLATELET
Absolute Monocytes: 510 cells/uL (ref 200–950)
Basophils Absolute: 10 cells/uL (ref 0–200)
Basophils Relative: 0.2 %
Eosinophils Absolute: 99 cells/uL (ref 15–500)
Eosinophils Relative: 1.9 %
HCT: 40.5 % (ref 35.0–45.0)
Hemoglobin: 14.2 g/dL (ref 11.7–15.5)
Lymphs Abs: 1940 cells/uL (ref 850–3900)
MCH: 33.8 pg — ABNORMAL HIGH (ref 27.0–33.0)
MCHC: 35.1 g/dL (ref 32.0–36.0)
MCV: 96.4 fL (ref 80.0–100.0)
MPV: 9.6 fL (ref 7.5–12.5)
Monocytes Relative: 9.8 %
Neutro Abs: 2642 cells/uL (ref 1500–7800)
Neutrophils Relative %: 50.8 %
Platelets: 225 10*3/uL (ref 140–400)
RBC: 4.2 10*6/uL (ref 3.80–5.10)
RDW: 13.8 % (ref 11.0–15.0)
Total Lymphocyte: 37.3 %
WBC: 5.2 10*3/uL (ref 3.8–10.8)

## 2022-08-16 LAB — COMPLETE METABOLIC PANEL WITH GFR
AG Ratio: 1.4 (calc) (ref 1.0–2.5)
ALT: 20 U/L (ref 6–29)
AST: 25 U/L (ref 10–35)
Albumin: 4.1 g/dL (ref 3.6–5.1)
Alkaline phosphatase (APISO): 65 U/L (ref 37–153)
BUN: 12 mg/dL (ref 7–25)
CO2: 30 mmol/L (ref 20–32)
Calcium: 8.1 mg/dL — ABNORMAL LOW (ref 8.6–10.4)
Chloride: 101 mmol/L (ref 98–110)
Creat: 0.75 mg/dL (ref 0.60–0.95)
Globulin: 2.9 g/dL (calc) (ref 1.9–3.7)
Glucose, Bld: 89 mg/dL (ref 65–99)
Potassium: 3.7 mmol/L (ref 3.5–5.3)
Sodium: 140 mmol/L (ref 135–146)
Total Bilirubin: 0.6 mg/dL (ref 0.2–1.2)
Total Protein: 7 g/dL (ref 6.1–8.1)
eGFR: 72 mL/min/{1.73_m2} (ref >=60)

## 2022-08-20 ENCOUNTER — Ambulatory Visit: Payer: Medicare Other | Admitting: Neurology

## 2022-08-20 ENCOUNTER — Encounter: Payer: Self-pay | Admitting: Neurology

## 2022-08-20 VITALS — BP 146/68 | HR 72 | Ht 59.0 in | Wt 125.4 lb

## 2022-08-20 DIAGNOSIS — M542 Cervicalgia: Secondary | ICD-10-CM

## 2022-08-20 DIAGNOSIS — Z8673 Personal history of transient ischemic attack (TIA), and cerebral infarction without residual deficits: Secondary | ICD-10-CM

## 2022-08-20 DIAGNOSIS — Z7901 Long term (current) use of anticoagulants: Secondary | ICD-10-CM | POA: Diagnosis not present

## 2022-08-20 DIAGNOSIS — I482 Chronic atrial fibrillation, unspecified: Secondary | ICD-10-CM | POA: Diagnosis not present

## 2022-08-20 DIAGNOSIS — I69391 Dysphagia following cerebral infarction: Secondary | ICD-10-CM | POA: Diagnosis not present

## 2022-08-20 DIAGNOSIS — I509 Heart failure, unspecified: Secondary | ICD-10-CM | POA: Diagnosis not present

## 2022-08-20 DIAGNOSIS — R42 Dizziness and giddiness: Secondary | ICD-10-CM | POA: Diagnosis not present

## 2022-08-20 DIAGNOSIS — R531 Weakness: Secondary | ICD-10-CM | POA: Diagnosis not present

## 2022-08-20 DIAGNOSIS — I69398 Other sequelae of cerebral infarction: Secondary | ICD-10-CM | POA: Diagnosis not present

## 2022-08-20 DIAGNOSIS — Z79899 Other long term (current) drug therapy: Secondary | ICD-10-CM

## 2022-08-20 DIAGNOSIS — Z9181 History of falling: Secondary | ICD-10-CM | POA: Diagnosis not present

## 2022-08-20 DIAGNOSIS — H532 Diplopia: Secondary | ICD-10-CM | POA: Diagnosis not present

## 2022-08-20 DIAGNOSIS — R2689 Other abnormalities of gait and mobility: Secondary | ICD-10-CM | POA: Diagnosis not present

## 2022-08-20 DIAGNOSIS — I6302 Cerebral infarction due to thrombosis of basilar artery: Secondary | ICD-10-CM

## 2022-08-20 DIAGNOSIS — I110 Hypertensive heart disease with heart failure: Secondary | ICD-10-CM | POA: Diagnosis not present

## 2022-08-20 DIAGNOSIS — I69322 Dysarthria following cerebral infarction: Secondary | ICD-10-CM | POA: Diagnosis not present

## 2022-08-20 NOTE — Patient Instructions (Addendum)
Continue using your walker at all times. Please take your medications as directed. As discussed, secondary prevention is key after a stroke. This means: taking care of blood sugar values or diabetes management (A1c goal of less than 7.0), good blood pressure (hypertension) control and optimizing cholesterol management (with LDL goal of less than 70), exercising daily or regularly within your own mobility limitations of course.  We will do a cervical spine (i.e. neck) MRI to look for degenerative changes.   Please talk to your primary care about gradually coming off the temazepam.   Avoid drinking tea, including decaff tea, as you have been drinking several cups/day.

## 2022-08-20 NOTE — Progress Notes (Addendum)
Subjective:    Patient ID: Kristen Whitehead is a 89 y.o. female.  HPI    Kristen Age, MD, PhD [facility] [address], [address] [place], [place] [address]  I saw [name] as a referral from the hospital for evaluation after her recent stroke.  The patient is accompanied by her daughter today.  I had previously followed her in the past for restless leg syndrome.  Kristen Whitehead is a 89 year old female with an underlying complex medical history of thyroid cancer, chronic atrial fibrillation, chronic diastolic congestive heart failure, history of heart attack, macular degeneration, history of mitral valve prolapse, hypertension, hyperlipidemia, hypothyroidism, restless leg syndrome, symptoms, and reflux disease, who reports feeling better.  She was hospitalized in November from 07/08/2022 through 07/15/2022.  She presented with inability to walk and weakness.  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  Initial CT head without contrast was negative, CT angiogram head and neck revealed the thrombus.  She was outside the window for thrombolysis.  Stroke was deemed secondary to thromboembolism with history of A-fib not on anticoagulation.  She had stopped Eliquis due to history of falls.  Her A-fib with RVR improved with the addition of atenolol.  Thrombectomy was deferred and patient was placed on heparin drip with transition to Eliquis.  MRI of the brain showed small acute infarcts in the left midbrain and right medial pons.  Echocardiogram showed an EF of 60 to 65%, LDL was 108 and she was started on Zetia.  A1c was 5.8.  She was started on Eliquis 2.5 mg twice daily.  She currently stays with her granddaughter.  Her daughter lives close by, they all live on family property.  She is a non-smoker.  She does not currently drink any alcohol.  She does drink caffeine in the form of decaf tea but several cups per  day, on average 6 to 8 cups.  She reports that she has had neck discomfort in the past 6 months, particularly on the left side radiating upwards to her ear or behind the ear.  She has occasional occipital headaches.  These are not new.  As far as her restless legs, she no longer takes pramipexole.  Of note, she takes temazepam nightly, 15 mg strength.  She has been on temazepam for years and reports that she cannot sleep without it.  09/10/2022: Addendum, changed MRI order to cervical spine w/wo.   Previously:   03/24/19: 89 year old right-handed woman with an underlying medical history of hypertension, hyperlipidemia, atrial fibrillation, celiac disease, scoliosis, reflux disease, who presents for followup consultation of her RLS. She is unaccompanied today. I last saw her on 01/22/2018, at which time she felt that her foot pain and restless leg symptoms were stable on low-dose Mirapex.  She had a recent echocardiogram in May 2019 which showed atrial fibrillation, normal left ventricular size with normal EF.  She had moderate biatrial enlargement and moderate pulmonary hypertension.  She was taking Mirapex at night without side effects reported she had some trouble finding shoes that were comfortable.    She reports doing well, she has practically no symptoms of restless legs or foot pain any longer, continues to take pramipexole at night, 0.125 mg, denies any side effects.  She does report a recent fall, she was outside, walking to a friend's house where the meat outside and sit, keeping a distance.  She had almost reached,  she fell backwards, she did not recall hitting her head, she reports that she fell on her right hip.  EMS was called and they checked her out, she was not taken to the hospital, no x-ray were taken, she reports that she did not have any pain and was moving everything well in the neck no soreness and no bruise.  She tries to hydrate well with water.  She has a diuretic and is mindful of the  need for water intake.  She has a history of dry mouth and dry eyes.      I saw her on 01/22/2017, at which time she reported doing well, memory loss stable, reported mostly word finding difficulty, was sleeping well, less cramping, in fact, she felt that her muscle spasms and cramps were completely gone. I suggested she continue with low-dose Mirapex 0.125 mg strength generic at night and follow-up in one year.    I saw her on 07/24/2016, at which time she reported cramping in her legs, more so on the right and particularly at night. She felt she had muscle spasms. She would walk around extensively at night to relieve the muscle spasms. She admitted that she did not always drink enough water. She also had a tendency to skip breakfast. She was advised to continue with low dose pramipexole 0.125 mg each night but advised to drink more water, perhaps Gatorade and even tonic water to see if her muscle spasms and cramping would improve. She was also advised to have a more balanced diet and not to skip any meals.    I saw her on 01/23/2016, at which time she reported doing well. She had no recent pain in her hands or feet and no cramping was reported, no paresthesias. She had a recent cardiology visit in March 2017. She reported some short-term memory issues and word finding issues. She was quite active physically and mentally. I suggested we monitor her memory complaints. She was advised to continue with low-dose Mirapex.   I saw her on 07/24/2015, at which time she reported feeling stable. She felt that there was tingling in her legs and she had a burning sensation in her feet. She did feel that Mirapex was helpful. She had some intermittent tingling in her fingers. She also reported feeling more forgetful. I suggested we continue with low dose pramipexole 0.125 mg each night.   I saw her on 01/20/2015 at which time she reported feeling stable for the most part. Her burning sensation seemed about the same  and her leg cramping seemed a little less overall. She was tolerating low-dose Mirapex. She saw her cardiologist 2 days prior. She had presented to the emergency room on 11/10/2014 with chest pain and dizziness. She was admitted for workup. Cardiac workup was negative. She has A. Fib, rate controlled. She has stable lower extremity swelling and was reminded by her cardiologist to use compression stockings. She was not drinking enough water. I asked her to continue low-dose Mirapex each night.   I saw her on 07/21/2014, at which time she reported a fall about 3 months prior. She had tripped over something. She had no significant injuries and no sequelae. She was not drinking enough water. Her leg swelling was stable. I suggested she continue with low-dose Mirapex, 0.125 mg once nightly.   I saw her on 12/27/2013, at which time she noted ankle swelling. She had gone into cardiac failure the year before and felt similar to that. Nevertheless, because of her lower  extremity swelling I suggested she cut back on Mirapex to 0.125 mg each night and see if the swelling improves. If she had flareup in her restless leg symptoms I suggested she could go back to 0.25 mg nightly.   I saw her on 06/28/2013, at which time I noted on clinical exam that she had mild evidence of neuropathy in the absence of a history of diabetes with normal hemoglobin A1c noted and normal B12 levels. She had EMG and nerve conduction studies that did not show any significant neuropathy. I felt that she had most likely RLS. She had done well with Mirapex. I did not make any changes to her medication regimen and asked her to continue taking Mirapex 0.25 mg each night. She did not have any side effects at the time.   I first met her on 02/17/2013, at which time I suggested EMG and nerve conduction testing and a treatment trial with Mirapex because of possible underlying RLS. She has previously seen a podiatrist who had done a nerve biopsy which  she reported was normal. She has a history of A. fib. She has a history of congestive heart failure and is followed by cardiology. Echocardiogram in January 2014 showed biatrial enlargement and mild mitral regurgitation and an EF of 94-85% with diastolic dysfunction.   For years she has had burning in her feet and started having a crawling sensation in her distal legs in 2014. She has also had cramping in her muscles for years and she was taken off of her statin in 2013 and her cramping eventually became better. She has had pins and needles sensation and it helps to walk around. She has worse symptoms at night or by the end of the day. She remembers having "terrible growing pains" as a child and does not have a FHx of RLS as far as she knows. She had nerve biopsies both legs by her podiatrist, which did not show inflammation. While she has a history of chronic back pain she denied any radiating back pain to her legs. In fact she described pins and needle and crawling sensation in the distal lower extremities, radiating upwards. She has been taking OTC Mg supplements, which helped her cramps.   Her EMG and nerve conduction testing from 03/03/2013 showed no evidence of peripheral neuropathy. We called her with her test results. Her foot pain and paresthesias improved.     Her Past Medical History Is Significant For: Past Medical History:  Diagnosis Date   Bladder prolapse, female, acquired    Cancer [facility])    thyroid   Chest pain    no known ischemic heart disease; negative Myoview July 2013. EF 75% with no ischemia.    Chronic anticoagulation    Chronic atrial fibrillation (HCC)    managed with rate control and coumadin   Chronic diastolic heart failure (HCC)    GERD (gastroesophageal reflux disease)    Heart disease    History of congenital mitral regurgitation    mild   History of heart attack    Multiple    History of mitral valve prolapse 06/15/2008   a. echo 1/14: mild LVH, EF 60-65%,  mild MR, mild to mod BAE, PASP 35   Hypercholesterolemia    Hypertension    Hypothyroidism    Stroke [facility])     Her Past Surgical History Is Significant For: Past Surgical History:  Procedure Laterality Date   ABDOMINAL HYSTERECTOMY     CATARACT EXTRACTION     CHOLECYSTECTOMY  Thyroidectomy     TONSILLECTOMY      Her Family History Is Significant For: Family History  Problem Relation Whitehead of Onset   Stroke Mother 6   Stroke Father 19   Arthritis Daughter    Gout Daughter    Immunocompromised Granddaughter    Heart attack Neg Hx    Heart disease Neg Hx     Her Social History Is Significant For: Social History   Socioeconomic History   Marital status: Widowed    Spouse name: Not on file   Number of children: 3   Years of education: BA   Highest education level: Not on file  Occupational History    Employer: RETIRED   Occupation: Retired [employer]  Tobacco Use   Smoking status: Never   Smokeless tobacco: Never  Vaping Use   Vaping Use: Never used  Substance and Sexual Activity   Alcohol use: No   Drug use: No   Sexual activity: Not Currently  Other Topics Concern   Not on file  Social History Narrative   Pt lives at home alone.   Caffeine Use: quit 98yr ago      Diet: No FMaceo Pro     Do you drink/ eat things with caffeineNo      Marital status:   Widowed                            What year were you married ? 1947      Do you live in a house, apartment,assistred living, condo, trailer, etc.)? [place]     Is it one or more stories? 1      How many persons live in your home ? Me      Do you have any pets in your home ?(please list) No      Highest Level of education completed:  [place] [employer]     Current or past profession:  TPharmacist, hospital Office, [employer]      Do you exercise?  No                            Type & how often       ADVANCED DIRECTIVES (Please bring copies)      Do you have a living will? Yes      Do you have a  DNR form?   Yes                    If not, do you want to discuss one?       Do you have signed POA?HPOA forms?  Yes               If so, please bring to your appointment      FUNCTIONAL STATUS- To be completed by Spouse / child / Staff       Do you have difficulty bathing or dressing yourself ?  No      Do you have difficulty preparing food or eating ?  No      Do you have difficulty managing your mediation ?  No      Do you have difficulty managing your finances ?  No      Do you have difficulty affording your medication ?  No      Social Determinants of Health   Financial Resource Strain: Low  Risk  (05/06/2022)   Overall Financial Resource Strain (CARDIA)    Difficulty of Paying Living Expenses: Not hard at all  Food Insecurity: No Food Insecurity (05/06/2022)   Hunger Vital Sign    Worried About Running Out of Food in the Last Year: Never true    Ran Out of Food in the Last Year: Never true  Transportation Needs: No Transportation Needs (05/06/2022)   PRAPARE - Hydrologist (Medical): No    Lack of Transportation (Non-Medical): No  Physical Activity: Insufficiently Active (05/06/2022)   Exercise Vital Sign    Days of Exercise per Week: 2 days    Minutes of Exercise per Session: 10 min  Stress: No Stress Concern Present (05/06/2022)   [place]    Feeling of Stress : Not at all  Social Connections: Moderately Integrated (05/06/2022)   Social Connection and Isolation Panel [NHANES]    Frequency of Communication with Friends and Family: Three times a week    Frequency of Social Gatherings with Friends and Family: Three times a week    Attends Religious Services: 1 to 4 times per year    Active Member of Clubs or Organizations: Yes    Attends Archivist Meetings: More than 4 times per year    Marital Status: Widowed    Her Allergies Are:  Allergies  Allergen Reactions    Azithromycin Nausea Only and Other (See Comments)   Compazine [Prochlorperazine Edisylate] Nausea Only   Demerol Nausea Only   Dolophine [Methadone] Nausea Only   Ebastine Nausea Only    EBS   Erythromycin Nausea Only and Other (See Comments)   Gluten Meal Diarrhea   Ibuprofen Nausea Only and Other (See Comments)   Meperidine Hcl Other (See Comments)   Morphine Sulfate Other (See Comments)   Statins Other (See Comments)    myalgias   Tetanus Toxoids Nausea Only   Tetracycline Other (See Comments)   Tetracyclines & Related Nausea Only  :   Her Current Medications Are:  Outpatient Encounter Medications as of 08/20/2022  Medication Sig   amLODipine (NORVASC) 2.5 MG tablet TAKE 2 TABLETS BY MOUTH  DAILY   apixaban (ELIQUIS) 2.5 MG TABS tablet Take 1 tablet (2.5 mg total) by mouth 2 (two) times daily.   atenolol (TENORMIN) 25 MG tablet Take 1 tablet (25 mg total) by mouth 2 (two) times daily.   dorzolamide (TRUSOPT) 2 % ophthalmic solution Place 1 drop into the left eye 2 (two) times daily.   ezetimibe (ZETIA) 10 MG tablet Take 1 tablet (10 mg total) by mouth daily.   furosemide (LASIX) 40 MG tablet Take 1 tablet (40 mg total) by mouth daily.   latanoprost (XALATAN) 0.005 % ophthalmic solution Place 1 drop into both eyes at bedtime.    levothyroxine (SYNTHROID) 75 MCG tablet Take 1 tablet (75 mcg total) by mouth daily at 6 (six) AM.   Multiple Vitamin (MULTIVITAMIN WITH MINERALS) TABS tablet Take 1 tablet by mouth daily.   nitroGLYCERIN (NITROSTAT) 0.4 MG SL tablet DISSOLVE 1 TABLET UNDER THE TONGUE EVERY 5 MINUTES FOR 3 DOSES AS NEEDED  FOR  CHEST  PAIN (Patient taking differently: Place 0.4 mg under the tongue every 5 (five) minutes as needed for chest pain.)   pramipexole (MIRAPEX) 0.125 MG tablet TAKE 1 TABLET BY MOUTH AT  BEDTIME   PREMARIN vaginal cream Place 2 g vaginally 2 (two) times a week. Uses on Sunday  and wednesday   Probiotic Product (PROBIOTIC DAILY PO) Take 1 tablet by  mouth daily.   sodium chloride (MURO 128) 5 % ophthalmic solution Place 1 drop into the right eye at bedtime.    omeprazole (PRILOSEC) 20 MG capsule Take 20 mg by mouth daily as needed (acid reflux).   No facility-administered encounter medications on file as of 08/20/2022.  :   Review of Systems:  Out of a complete 14 point review of systems, all are reviewed and negative with the exception of these symptoms as listed below:   Review of Systems  Neurological:        Pt here for stroke f/u Pt states having a headache in back of head. Pt states she is having language lost .  Pt is using walker     Objective:  Neurological Exam  Physical Exam Physical Examination:   Vitals:   08/20/22 1511  BP: (!) 146/68  Pulse: 72    General Examination: The patient is a very pleasant 89 y.o. female in no acute distress. She appears well-developed and well-nourished and well groomed.   HEENT: Normocephalic, atraumatic, pupils are slightly unequal, she is status post cataract repairs.  Tracking is well-preserved, hearing grossly intact, face is symmetric with normal facial animation, no obvious dysarthria, no hypophonia, no voice tremor.  Neck with full range of motion, no palpable mass.  No carotid bruits.  Airway examination reveals moderate mouth dryness, tongue protrudes centrally and palate elevates symmetrically.     Chest: Clear to auscultation without wheezing, rhonchi or crackles noted.   Heart: S1+S2+0, regular, no murmur appreciated.       Abdomen: Soft, non-tender and non-distended with normal bowel sounds appreciated on auscultation.   Extremities: There is 1+ edema bilaterally, she wears compression stockings up to the knees bilaterally.   Skin: Warm and dry without trophic changes noted.    Musculoskeletal: exam reveals no obvious joint deformities, mild knee swelling on the left.     Neurologically:  Mental status: The patient is awake, alert and oriented in all 4 spheres.  Her memory, attention, language and knowledge are appropriate. There is no aphasia, agnosia, apraxia or anomia. Speech is clear with normal prosody and enunciation. Thought process is linear. Mood is congruent and affect is normal.  Cranial nerves are as described above under HEENT exam. In addition, shoulder shrug is normal with equal shoulder height noted.  Motor exam: Normal bulk, strength and tone is noted. There is no tremor or rebound. Reflexes are 1+ in the UEs and absent in the lower extremities.  Fine motor skills are globally mildly impaired.  Cerebellar testing shows no dysmetria or intention tremor.  Sensory exam: Normal to light touch in all 4 extremities.   Gait, station and balance: she stands with no significant difficulty.  She uses a 2 wheeled walker.  No shuffling noted.  Posture appears Whitehead-appropriate.   Assessment and Plan:    In summary, Kristen Whitehead is a 89 year old female with an underlying medical history of hypertension, hyperlipidemia, atrial fibrillation, celiac disease, scoliosis, reflux disease, RLS and recent stroke with basilar artery thrombus (deemed cardioembolic, due to a fib, off Eliquis), who presents for post-hospitalization eval after stroke.  She had full stroke workup during her hospitalization in late November 2023.  She is now on Eliquis twice daily and Zetia.  She tolerates the medication.  We talked about fall prevention and the importance of stroke secondary prevention.  She is advised to stay well-hydrated  with water.  She is advised to reduce her tea intake even though she drinks decaf tea, however, she drinks several cups per day. She has a history c/w neuropathy, but had a neg EMG/NCV in 2014. She no longer takes pramipexole. She is on temazepam 15 mg nightly, which is a high-risk medication.  She is advised to talk to her prescriber about the possibility of gradually coming off of temazepam.  She is a fall risk and has indeed fallen in the past  which is the reason why she was taken off of Eliquis in the first place.  She complains of neck pain.  Her exam does not suggest any obvious abnormality but I suggested we proceed with a neck MRI without contrast to rule out a structural cause of her symptoms.  She is agreeable.  We will call her with her MRI results.  She is advised to follow-up to see one of our nurse practitioners in 6 months, sooner if needed.  If she is stable at the time, she can follow-up in our clinic as needed.  I answered all the questions today and the patient and her daughter were in agreement.

## 2022-08-21 DIAGNOSIS — I110 Hypertensive heart disease with heart failure: Secondary | ICD-10-CM | POA: Diagnosis not present

## 2022-08-21 DIAGNOSIS — H532 Diplopia: Secondary | ICD-10-CM | POA: Diagnosis not present

## 2022-08-21 DIAGNOSIS — Z7901 Long term (current) use of anticoagulants: Secondary | ICD-10-CM | POA: Diagnosis not present

## 2022-08-21 DIAGNOSIS — I69322 Dysarthria following cerebral infarction: Secondary | ICD-10-CM | POA: Diagnosis not present

## 2022-08-21 DIAGNOSIS — R2689 Other abnormalities of gait and mobility: Secondary | ICD-10-CM | POA: Diagnosis not present

## 2022-08-21 DIAGNOSIS — I69398 Other sequelae of cerebral infarction: Secondary | ICD-10-CM | POA: Diagnosis not present

## 2022-08-21 DIAGNOSIS — I509 Heart failure, unspecified: Secondary | ICD-10-CM | POA: Diagnosis not present

## 2022-08-21 DIAGNOSIS — R42 Dizziness and giddiness: Secondary | ICD-10-CM | POA: Diagnosis not present

## 2022-08-21 DIAGNOSIS — Z9181 History of falling: Secondary | ICD-10-CM | POA: Diagnosis not present

## 2022-08-21 DIAGNOSIS — I69391 Dysphagia following cerebral infarction: Secondary | ICD-10-CM | POA: Diagnosis not present

## 2022-08-21 DIAGNOSIS — R531 Weakness: Secondary | ICD-10-CM | POA: Diagnosis not present

## 2022-08-21 DIAGNOSIS — I482 Chronic atrial fibrillation, unspecified: Secondary | ICD-10-CM | POA: Diagnosis not present

## 2022-08-22 ENCOUNTER — Telehealth: Payer: Self-pay | Admitting: Neurology

## 2022-08-22 DIAGNOSIS — Z9181 History of falling: Secondary | ICD-10-CM | POA: Diagnosis not present

## 2022-08-22 DIAGNOSIS — I110 Hypertensive heart disease with heart failure: Secondary | ICD-10-CM | POA: Diagnosis not present

## 2022-08-22 DIAGNOSIS — R42 Dizziness and giddiness: Secondary | ICD-10-CM | POA: Diagnosis not present

## 2022-08-22 DIAGNOSIS — H532 Diplopia: Secondary | ICD-10-CM | POA: Diagnosis not present

## 2022-08-22 DIAGNOSIS — I69322 Dysarthria following cerebral infarction: Secondary | ICD-10-CM | POA: Diagnosis not present

## 2022-08-22 DIAGNOSIS — R2689 Other abnormalities of gait and mobility: Secondary | ICD-10-CM | POA: Diagnosis not present

## 2022-08-22 DIAGNOSIS — I69398 Other sequelae of cerebral infarction: Secondary | ICD-10-CM | POA: Diagnosis not present

## 2022-08-22 DIAGNOSIS — I509 Heart failure, unspecified: Secondary | ICD-10-CM | POA: Diagnosis not present

## 2022-08-22 DIAGNOSIS — M542 Cervicalgia: Secondary | ICD-10-CM

## 2022-08-22 DIAGNOSIS — I69391 Dysphagia following cerebral infarction: Secondary | ICD-10-CM | POA: Diagnosis not present

## 2022-08-22 DIAGNOSIS — R531 Weakness: Secondary | ICD-10-CM | POA: Diagnosis not present

## 2022-08-22 DIAGNOSIS — I482 Chronic atrial fibrillation, unspecified: Secondary | ICD-10-CM | POA: Diagnosis not present

## 2022-08-22 DIAGNOSIS — Z7901 Long term (current) use of anticoagulants: Secondary | ICD-10-CM | POA: Diagnosis not present

## 2022-08-22 NOTE — Telephone Encounter (Signed)
[employer] medicare NPR sent to [facility] for open MRI [phone] 

## 2022-08-26 DIAGNOSIS — I69398 Other sequelae of cerebral infarction: Secondary | ICD-10-CM | POA: Diagnosis not present

## 2022-08-26 DIAGNOSIS — R531 Weakness: Secondary | ICD-10-CM | POA: Diagnosis not present

## 2022-08-26 DIAGNOSIS — H532 Diplopia: Secondary | ICD-10-CM | POA: Diagnosis not present

## 2022-08-26 DIAGNOSIS — I509 Heart failure, unspecified: Secondary | ICD-10-CM | POA: Diagnosis not present

## 2022-08-26 DIAGNOSIS — I69322 Dysarthria following cerebral infarction: Secondary | ICD-10-CM | POA: Diagnosis not present

## 2022-08-26 DIAGNOSIS — I69391 Dysphagia following cerebral infarction: Secondary | ICD-10-CM | POA: Diagnosis not present

## 2022-08-26 DIAGNOSIS — Z7901 Long term (current) use of anticoagulants: Secondary | ICD-10-CM | POA: Diagnosis not present

## 2022-08-26 DIAGNOSIS — R42 Dizziness and giddiness: Secondary | ICD-10-CM | POA: Diagnosis not present

## 2022-08-26 DIAGNOSIS — R2689 Other abnormalities of gait and mobility: Secondary | ICD-10-CM | POA: Diagnosis not present

## 2022-08-26 DIAGNOSIS — I110 Hypertensive heart disease with heart failure: Secondary | ICD-10-CM | POA: Diagnosis not present

## 2022-08-26 DIAGNOSIS — I482 Chronic atrial fibrillation, unspecified: Secondary | ICD-10-CM | POA: Diagnosis not present

## 2022-08-26 DIAGNOSIS — Z9181 History of falling: Secondary | ICD-10-CM | POA: Diagnosis not present

## 2022-08-27 ENCOUNTER — Telehealth: Payer: Self-pay | Admitting: *Deleted

## 2022-08-27 DIAGNOSIS — I110 Hypertensive heart disease with heart failure: Secondary | ICD-10-CM | POA: Diagnosis not present

## 2022-08-27 DIAGNOSIS — R531 Weakness: Secondary | ICD-10-CM | POA: Diagnosis not present

## 2022-08-27 DIAGNOSIS — I69398 Other sequelae of cerebral infarction: Secondary | ICD-10-CM | POA: Diagnosis not present

## 2022-08-27 DIAGNOSIS — I69391 Dysphagia following cerebral infarction: Secondary | ICD-10-CM | POA: Diagnosis not present

## 2022-08-27 DIAGNOSIS — Z9181 History of falling: Secondary | ICD-10-CM | POA: Diagnosis not present

## 2022-08-27 DIAGNOSIS — R42 Dizziness and giddiness: Secondary | ICD-10-CM | POA: Diagnosis not present

## 2022-08-27 DIAGNOSIS — I69322 Dysarthria following cerebral infarction: Secondary | ICD-10-CM | POA: Diagnosis not present

## 2022-08-27 DIAGNOSIS — H532 Diplopia: Secondary | ICD-10-CM | POA: Diagnosis not present

## 2022-08-27 DIAGNOSIS — I509 Heart failure, unspecified: Secondary | ICD-10-CM | POA: Diagnosis not present

## 2022-08-27 DIAGNOSIS — Z7901 Long term (current) use of anticoagulants: Secondary | ICD-10-CM | POA: Diagnosis not present

## 2022-08-27 DIAGNOSIS — R2689 Other abnormalities of gait and mobility: Secondary | ICD-10-CM | POA: Diagnosis not present

## 2022-08-27 DIAGNOSIS — I482 Chronic atrial fibrillation, unspecified: Secondary | ICD-10-CM | POA: Diagnosis not present

## 2022-08-27 NOTE — Progress Notes (Unsigned)
  Care Coordination Note  08/27/2022 Name: [name] MRN: [id number] DOB: 08-08-1923  Kristen Whitehead is a 89 y.o. year old female who is a primary care patient of Cleophas Dunker, [place], NP and is actively engaged with the care management team. I reached out to Kristen Whitehead by phone today to assist with re-scheduling a follow up visit with the RN Case Manager  Follow up plan: Unsuccessful telephone outreach attempt made. A HIPAA compliant phone message was left for the patient providing contact information and requesting a return call.   [place]  Direct Dial: [phone]

## 2022-08-28 DIAGNOSIS — H353211 Exudative age-related macular degeneration, right eye, with active choroidal neovascularization: Secondary | ICD-10-CM | POA: Diagnosis not present

## 2022-08-29 ENCOUNTER — Ambulatory Visit (INDEPENDENT_AMBULATORY_CARE_PROVIDER_SITE_OTHER): Payer: Medicare Other | Admitting: Orthopedic Surgery

## 2022-08-29 ENCOUNTER — Encounter: Payer: Self-pay | Admitting: Orthopedic Surgery

## 2022-08-29 VITALS — BP 119/78 | HR 79 | Temp 98.5°F | Resp 18 | Ht 59.0 in | Wt 123.2 lb

## 2022-08-29 DIAGNOSIS — Z8673 Personal history of transient ischemic attack (TIA), and cerebral infarction without residual deficits: Secondary | ICD-10-CM | POA: Diagnosis not present

## 2022-08-29 DIAGNOSIS — I5032 Chronic diastolic (congestive) heart failure: Secondary | ICD-10-CM

## 2022-08-29 DIAGNOSIS — F5101 Primary insomnia: Secondary | ICD-10-CM

## 2022-08-29 MED ORDER — TEMAZEPAM 7.5 MG PO CAPS: 7.5000 mg | ORAL_CAPSULE | Freq: Every evening | ORAL | 3 refills | Status: DC | PRN

## 2022-08-29 NOTE — Progress Notes (Signed)
Careteam: Patient Care Team: [name], NP as PCP - General (Adult Health Nurse Practitioner) O'Neal, Cassie Freer, MD as PCP - Cardiology (Cardiology) Rex Kras, Claudette Stapler, RN as [place] Management [place], Tillie Rung as [place], Cassie Freer, MD as Consulting Physician (Cardiology)  Seen by: [name], AGNP-C  PLACE OF SERVICE:  [place]  Advanced Directive information    Allergies  Allergen Reactions   Azithromycin Nausea Only and Other (See Comments)   Compazine [Prochlorperazine Edisylate] Nausea Only   Demerol Nausea Only   Dolophine [Methadone] Nausea Only   Ebastine Nausea Only    EBS   Erythromycin Nausea Only and Other (See Comments)   Gluten Meal Diarrhea   Ibuprofen Nausea Only and Other (See Comments)   Meperidine Hcl Other (See Comments)   Morphine Sulfate Other (See Comments)   Statins Other (See Comments)    myalgias   Tetanus Toxoids Nausea Only   Tetracycline Other (See Comments)   Tetracyclines & Related Nausea Only    Chief Complaint  Patient presents with   Medical Management of Chronic Issues    Insomnia and legs hurting.     HPI: Patient is a 89 y.o. female seen today for acute visit due to insomnia.   Advised to wean off temazepam last hospitalization. She has used medication > 30 years. She has tried to not use medication prior to bedtime and unable to sleep. She reports increased leg pain at night without using medication. She would like to continue medication at a lower dose. Prior dose was 15 mg qhs.   Edema to lower legs has improved. Denies weight fluctuations or sob. Continues to take furosemide 40 mg every morning.   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 Thrombectomy deferred, started on heparin gtt.  Completed SNF. Now living with family in [place]. Remains on Zetia and low does Eliquis. Recently seen by neuro, open MRI recommended, having trouble scheduling.    Review of Systems:  Review of Systems  Constitutional:  Negative for chills and fever.  HENT:  Negative for congestion and sore throat.   Eyes:  Negative for blurred vision and double vision.  Respiratory:  Negative for cough, shortness of breath and wheezing.   Cardiovascular:  Positive for leg swelling. Negative for chest pain.  Musculoskeletal:  Positive for myalgias.  Neurological:  Positive for tingling and weakness.  Psychiatric/Behavioral:  Negative for depression. The patient has insomnia. The patient is not nervous/anxious.     Past Medical History:  Diagnosis Date   Bladder prolapse, female, acquired    Cancer [facility])    thyroid   Chest pain    no known ischemic heart disease; negative Myoview July 2013. EF 75% with no ischemia.    Chronic anticoagulation    Chronic atrial fibrillation (HCC)    managed with rate control and coumadin   Chronic diastolic heart failure (HCC)    GERD (gastroesophageal reflux disease)    Heart disease    History of congenital mitral regurgitation    mild   History of heart attack    Multiple    History of mitral valve prolapse 06/15/2008   a. echo 1/14: mild LVH, EF 60-65%, mild MR, mild to mod BAE, PASP 35   Hypercholesterolemia    Hypertension  Hypothyroidism    Stroke [facility])    Past Surgical History:  Procedure Laterality Date   ABDOMINAL HYSTERECTOMY     CATARACT EXTRACTION     CHOLECYSTECTOMY     Thyroidectomy     TONSILLECTOMY     Social History:   reports that she has never smoked. She has never used smokeless tobacco. She reports that she does not drink alcohol and does not use drugs.  Family History  Problem Relation Age of Onset   Stroke Mother 55   Stroke Father 63   Arthritis Daughter    Gout Daughter    Immunocompromised Granddaughter    Heart  attack Neg Hx    Heart disease Neg Hx     Medications: Patient's Medications  New Prescriptions   No medications on file  Previous Medications   AMLODIPINE (NORVASC) 2.5 MG TABLET    TAKE 2 TABLETS BY MOUTH  DAILY   APIXABAN (ELIQUIS) 2.5 MG TABS TABLET    Take 1 tablet (2.5 mg total) by mouth 2 (two) times daily.   ATENOLOL (TENORMIN) 25 MG TABLET    Take 1 tablet (25 mg total) by mouth 2 (two) times daily.   DORZOLAMIDE (TRUSOPT) 2 % OPHTHALMIC SOLUTION    Place 1 drop into the left eye 2 (two) times daily.   EZETIMIBE (ZETIA) 10 MG TABLET    Take 1 tablet (10 mg total) by mouth daily.   FUROSEMIDE (LASIX) 40 MG TABLET    Take 1 tablet (40 mg total) by mouth daily.   LATANOPROST (XALATAN) 0.005 % OPHTHALMIC SOLUTION    Place 1 drop into both eyes at bedtime.    LEVOTHYROXINE (SYNTHROID) 75 MCG TABLET    Take 1 tablet (75 mcg total) by mouth daily at 6 (six) AM.   MULTIPLE VITAMIN (MULTIVITAMIN WITH MINERALS) TABS TABLET    Take 1 tablet by mouth daily.   NITROGLYCERIN (NITROSTAT) 0.4 MG SL TABLET    DISSOLVE 1 TABLET UNDER THE TONGUE EVERY 5 MINUTES FOR 3 DOSES AS NEEDED  FOR  CHEST  PAIN   OMEPRAZOLE (PRILOSEC) 20 MG CAPSULE    Take 20 mg by mouth daily as needed (acid reflux).   PRAMIPEXOLE (MIRAPEX) 0.125 MG TABLET    TAKE 1 TABLET BY MOUTH AT  BEDTIME   PREMARIN VAGINAL CREAM    Place 2 g vaginally 2 (two) times a week. Uses on Sunday and wednesday   PROBIOTIC PRODUCT (PROBIOTIC DAILY PO)    Take 1 tablet by mouth daily.   SODIUM CHLORIDE (MURO 128) 5 % OPHTHALMIC SOLUTION    Place 1 drop into the right eye at bedtime.   Modified Medications   No medications on file  Discontinued Medications   No medications on file    Physical Exam:  Vitals:   08/29/22 1346  BP: 119/78  Pulse: 79  Resp: 18  Temp: 98.5 F (36.9 C)  SpO2: 98%  Weight: 123 lb 4 oz (55.9 kg)  Height: '4\' 11"'$  (1.499 m)   Body mass index is 24.89 kg/m. Wt Readings from Last 3 Encounters:  08/29/22 123 lb  4 oz (55.9 kg)  08/20/22 125 lb 6.4 oz (56.9 kg)  08/15/22 127 lb (57.6 kg)    Physical Exam Vitals reviewed.  Constitutional:      General: She is not in acute distress. HENT:     Head: Normocephalic.  Eyes:     General:        Right eye: No discharge.  Left eye: No discharge.  Cardiovascular:     Rate and Rhythm: Normal rate. Rhythm irregular.     Pulses: Normal pulses.     Heart sounds: Normal heart sounds.  Pulmonary:     Effort: Pulmonary effort is normal. No respiratory distress.     Breath sounds: Normal breath sounds. No wheezing or rales.  Abdominal:     General: Bowel sounds are normal. There is no distension.     Palpations: Abdomen is soft.     Tenderness: There is no abdominal tenderness.  Musculoskeletal:     Cervical back: Neck supple.     Right lower leg: Edema present.     Left lower leg: Edema present.     Comments: Non pitting, L>R  Skin:    General: Skin is warm and dry.     Capillary Refill: Capillary refill takes less than 2 seconds.  Neurological:     General: No focal deficit present.     Mental Status: She is alert and oriented to person, place, and time.     Motor: Weakness present.     Gait: Gait abnormal.     Comments: walker  Psychiatric:        Mood and Affect: Mood normal.        Behavior: Behavior normal.     Labs reviewed: Basic Metabolic Panel: Recent Labs    04/03/22 1114 05/20/22 1120 06/24/22 1624 07/08/22 1454 07/08/22 1738 07/08/22 2225 07/13/22 0325 07/15/22 0333 08/15/22 1139  NA  --   --    < >  --  139   < > 138 136 140  K  --   --    < >  --  3.5   < > 3.9 4.1 3.7  CL  --   --    < >  --  106   < > 106 104 101  CO2  --   --    < >  --  27   < > '24 24 30  '$ GLUCOSE  --   --    < >  --  125*   < > 93 120* 89  BUN  --   --    < >  --  9   < > 7* 16 12  CREATININE  --   --    < >  --  0.65   < > 0.73 0.77 0.75  CALCIUM  --   --    < >  --  7.6*   < > 7.5* 8.1* 8.1*  MG  --   --   --   --  1.8  --   --   --    --   TSH 0.11* 17.58*  --  19.195*  --   --   --   --   --    < > = values in this interval not displayed.   Liver Function Tests: Recent Labs    01/31/22 1051 08/15/22 1139  AST 23 25  ALT 15 20  BILITOT 0.5 0.6  PROT 6.3 7.0   No results for input(s): "LIPASE", "AMYLASE" in the last 8760 hours. No results for input(s): "AMMONIA" in the last 8760 hours. CBC: Recent Labs    06/24/22 1624 07/08/22 1448 07/10/22 0538 07/13/22 0325 07/15/22 0333 08/15/22 1139  WBC 6.5 7.3   < > 6.4 6.9 5.2  NEUTROABS 2,672 4.5  --   --   --  2,642  HGB  15.3 15.0   < > 13.9 13.0 14.2  HCT 44.7 45.3   < > 40.5 37.4 40.5  MCV 94.1 97.2   < > 94.8 94.9 96.4  PLT 238 230   < > 198 168 225   < > = values in this interval not displayed.   Lipid Panel: Recent Labs    07/09/22 0332  CHOL 207*  HDL 82  LDLCALC 108*  TRIG 84  CHOLHDL 2.5   TSH: Recent Labs    04/03/22 1114 05/20/22 1120 07/08/22 1454  TSH 0.11* 17.58* 19.195*   A1C: Lab Results  Component Value Date   HGBA1C 5.8 (H) 07/09/2022     Assessment/Plan 1. Primary insomnia - increased insomnia and bilateral leg pain without temazepam qhs - used temazepam > 30 years - will try reduced dose - temazepam (RESTORIL) 7.5 MG capsule; Take 1 capsule (7.5 mg total) by mouth at bedtime as needed for sleep.  Dispense: 30 capsule; Refill: 3  2. Chronic diastolic heart failure (HCC) - no weight fluctations or sob - non pitting edema, L>R - cont furosemide daily  3. Cerebrovascular accident (CVA) within last 3 months - 11/20 weakness and slurred speech - Small acute infarcts in left midbrain and right medial pons Thrombus of basilar tip - thrombectomy deferred, placed on heparin gtt - cont Zetia and low dose Eliquis - neurology recommending open MRI> having trouble scheduling per patient  Total time: 31 . Greater than 50% of total time spent doing patient education regarding insomnia, leg pain, CHF and post stroke recovery  including symptom/medication management.     Next appt:  [place] [phone]

## 2022-08-29 NOTE — Progress Notes (Signed)
  Care Coordination Note  08/29/2022 Name: Kristen Whitehead MRN: [id number] DOB: [birth year]  Kristen Whitehead is a 89 y.o. year old female who is a primary care patient of Cleophas Dunker, [place], NP and is actively engaged with the care management team. I reached out to [name] by phone today to assist with re-scheduling a follow up visit with the RN Case Manager  Follow up plan: A third unsuccessful telephone outreach attempt made. A HIPAA compliant phone message was left for the patient providing contact information and requesting a return call.   Kelly  Direct Dial: [phone]

## 2022-08-29 NOTE — Patient Instructions (Signed)
Reduce temazepam to 7.5 mg daily at nighttime  Call [facility] to discuss MRI scheduling  If legs still swollen tomorrow, take extra dose in afternoon

## 2022-09-01 DIAGNOSIS — I69398 Other sequelae of cerebral infarction: Secondary | ICD-10-CM | POA: Diagnosis not present

## 2022-09-01 DIAGNOSIS — R2689 Other abnormalities of gait and mobility: Secondary | ICD-10-CM | POA: Diagnosis not present

## 2022-09-01 DIAGNOSIS — M6281 Muscle weakness (generalized): Secondary | ICD-10-CM | POA: Diagnosis not present

## 2022-09-01 DIAGNOSIS — R2681 Unsteadiness on feet: Secondary | ICD-10-CM | POA: Diagnosis not present

## 2022-09-03 DIAGNOSIS — H1820 Unspecified corneal edema: Secondary | ICD-10-CM | POA: Diagnosis not present

## 2022-09-03 DIAGNOSIS — H04123 Dry eye syndrome of bilateral lacrimal glands: Secondary | ICD-10-CM | POA: Diagnosis not present

## 2022-09-05 DIAGNOSIS — H532 Diplopia: Secondary | ICD-10-CM | POA: Diagnosis not present

## 2022-09-05 DIAGNOSIS — R531 Weakness: Secondary | ICD-10-CM | POA: Diagnosis not present

## 2022-09-05 DIAGNOSIS — Z7901 Long term (current) use of anticoagulants: Secondary | ICD-10-CM | POA: Diagnosis not present

## 2022-09-05 DIAGNOSIS — R2689 Other abnormalities of gait and mobility: Secondary | ICD-10-CM | POA: Diagnosis not present

## 2022-09-05 DIAGNOSIS — Z9181 History of falling: Secondary | ICD-10-CM | POA: Diagnosis not present

## 2022-09-05 DIAGNOSIS — I69391 Dysphagia following cerebral infarction: Secondary | ICD-10-CM | POA: Diagnosis not present

## 2022-09-05 DIAGNOSIS — I110 Hypertensive heart disease with heart failure: Secondary | ICD-10-CM | POA: Diagnosis not present

## 2022-09-05 DIAGNOSIS — I482 Chronic atrial fibrillation, unspecified: Secondary | ICD-10-CM | POA: Diagnosis not present

## 2022-09-05 DIAGNOSIS — I69322 Dysarthria following cerebral infarction: Secondary | ICD-10-CM | POA: Diagnosis not present

## 2022-09-05 DIAGNOSIS — R42 Dizziness and giddiness: Secondary | ICD-10-CM | POA: Diagnosis not present

## 2022-09-05 DIAGNOSIS — I509 Heart failure, unspecified: Secondary | ICD-10-CM | POA: Diagnosis not present

## 2022-09-05 DIAGNOSIS — I69398 Other sequelae of cerebral infarction: Secondary | ICD-10-CM | POA: Diagnosis not present

## 2022-09-09 NOTE — Telephone Encounter (Signed)
Can I change the order to MRI neck with and without contrast?  I am not quite sure why we cannot do a plain MRI of the neck.

## 2022-09-09 NOTE — Telephone Encounter (Signed)
Message from [facility]:  Does the physician want mra neck w/o, mri soft tissue neck w/o, or another test? We do not do just MR neck wo contrast.  Please advise. The patient is scheduled for tomorrow

## 2022-09-10 DIAGNOSIS — M47812 Spondylosis without myelopathy or radiculopathy, cervical region: Secondary | ICD-10-CM | POA: Diagnosis not present

## 2022-09-10 DIAGNOSIS — M899 Disorder of bone, unspecified: Secondary | ICD-10-CM | POA: Diagnosis not present

## 2022-09-10 NOTE — Telephone Encounter (Signed)
MRI order changed to cerv spine MRI w/wo.

## 2022-09-10 NOTE — Addendum Note (Signed)
Addended by: Star Age on: 09/10/2022 11:12 AM   Modules accepted: Orders

## 2022-09-10 NOTE — Telephone Encounter (Signed)
From [facility]: Let Dr. Rexene Alberts know mri neck w/o is not a thing really.   Mra neck w/o looking at vessels (matches diagnosis) Mri soft tissue neck w/o looking at soft tissue ( does not match diagnosis) Mri cervical w/o looking at bones (does not match diagnosis)   I need her to chose one of the listed to match the diagnosis.

## 2022-09-10 NOTE — Addendum Note (Signed)
Addended by: Star Age on: 09/10/2022 11:09 AM   Modules accepted: Orders

## 2022-09-10 NOTE — Telephone Encounter (Signed)
I am running out of words. I am not quite sure, when the miscommunication is.  Please refer to my clinic note, I would like to get a cervical spine MRI with and without contrast, I was told a without contrast MRI of the cervical spine cannot be done.  I have a patient with chronic neck pain and I would like to get the cervical spine evaluated.  She already had a CT angiogram of the neck with her stroke workup.

## 2022-09-16 ENCOUNTER — Telehealth: Payer: Self-pay | Admitting: Neurology

## 2022-09-16 NOTE — Telephone Encounter (Signed)
I received patient's C-spine MRI report from [facility].  She had a cervical spine MRI with and without contrast on 09/10/2022: Impression: Indeterminate lesions within the C2 and T1 vertebral bodies.  Metastatic disease or multiple myeloma are within the differential.  Alternatively these could represent benign lesions.  Consider further evaluation with a bone scan or full body CT.  Degenerative changes in the cervical spine as detailed above.  Please call patient or her family member on DPR regarding her cervical spine MRI results.  There are some degenerative changes in keeping with arthritis but she had 2 spots, one on her C2 vertebra which is the second from the top and on the T1 vertebra which is the first thoracic vertebral body, which could represent abnormalities with a possibility of inflammation, a bone marrow cancer, or metastatic disease. The findings were not clear-cut, and further evaluation was recommended.    I would like for her to make an appointment with her primary care as soon as possible to discuss workup in particular, with regards to cancer workup.  Another scan called bone scan was recommended which I am happy to order but ultimately, she will need full workup with her primary care regarding the origin of these lesions potentially including cancer workup.  If she would like, we can go ahead and request a bone scan to see if she has any other spots in her spine or skeletal system.  Let me know if she is agreeable, you can go ahead and put a bone scan in.

## 2022-09-17 NOTE — Telephone Encounter (Signed)
Faxed over to pcp MRI report from [facility].  I did speak to [place], daughter of pt.  I did confirm pcp, but will confirm.

## 2022-09-17 NOTE — Telephone Encounter (Signed)
I called [facility] for pt to call back.  I called daughter Mitzi, to call back for results.

## 2022-09-17 NOTE — Telephone Encounter (Signed)
Mitzi, daughter of pt called back.  (On DPR).  I relayed the results of her MRI cervical to her per Dr. Guadelupe Sabin result note.  Pt had scan thru [facility].  She would like the result note sent to her via mychart and I said I could do that.

## 2022-09-18 NOTE — Telephone Encounter (Signed)
Can we please call patient to schedule with me? Thank you

## 2022-09-20 ENCOUNTER — Other Ambulatory Visit: Payer: Self-pay

## 2022-09-20 ENCOUNTER — Encounter: Payer: Self-pay | Admitting: Adult Health

## 2022-09-20 ENCOUNTER — Ambulatory Visit: Payer: Medicare Other | Admitting: Adult Health

## 2022-09-20 VITALS — BP 112/80 | HR 72 | Temp 97.3°F | Ht 59.0 in | Wt 121.2 lb

## 2022-09-20 DIAGNOSIS — Z8673 Personal history of transient ischemic attack (TIA), and cerebral infarction without residual deficits: Secondary | ICD-10-CM

## 2022-09-20 DIAGNOSIS — I4821 Permanent atrial fibrillation: Secondary | ICD-10-CM | POA: Diagnosis not present

## 2022-09-20 DIAGNOSIS — G4459 Other complicated headache syndrome: Secondary | ICD-10-CM | POA: Diagnosis not present

## 2022-09-20 DIAGNOSIS — M899 Disorder of bone, unspecified: Secondary | ICD-10-CM | POA: Diagnosis not present

## 2022-09-20 DIAGNOSIS — I10 Essential (primary) hypertension: Secondary | ICD-10-CM | POA: Diagnosis not present

## 2022-09-20 MED ORDER — ACETAMINOPHEN 500 MG PO TABS: 1000.0000 mg | ORAL_TABLET | Freq: Three times a day (TID) | ORAL | 0 refills | Status: DC | PRN

## 2022-09-20 NOTE — Patient Instructions (Signed)
General Headache Without Cause A headache is pain or discomfort felt around the head or neck area. There are many causes and types of headaches. A few common types include: Tension headaches. Migraine headaches. Cluster headaches. Chronic daily headaches. Sometimes, the specific cause of a headache may not be found. Follow these instructions at home: Watch your condition for any changes. Let your health care provider know about them. Take these steps to help with your condition: Managing pain     Take over-the-counter and prescription medicines only as told by your health care provider. Treatment may include medicines for pain that are taken by mouth or applied to the skin. Lie down in a dark, quiet room when you have a headache. Keep lights dim if bright lights bother you or make your headaches worse. If directed, put ice on your head and neck area: Put ice in a plastic bag. Place a towel between your skin and the bag. Leave the ice on for 20 minutes, 2-3 times per day. Remove the ice if your skin turns bright red. This is very important. If you cannot feel pain, heat, or cold, you have a greater risk of damage to the area. If directed, apply heat to the affected area. Use the heat source that your health care provider recommends, such as a moist heat pack or a heating pad. Place a towel between your skin and the heat source. Leave the heat on for 20-30 minutes. Remove the heat if your skin turns bright red. This is especially important if you are unable to feel pain, heat, or cold. You have a greater risk of getting burned. Eating and drinking Eat meals on a regular schedule. If you drink alcohol: Limit how much you have to: 0-1 drink a day for women who are not pregnant. 0-2 drinks a day for men. Know how much alcohol is in a drink. In the [place] one drink equals one 12 oz bottle of beer (355 mL), one 5 oz glass of wine (148 mL), or one 1 oz glass of hard liquor (44 mL). Stop  drinking caffeine, or decrease the amount of caffeine you drink. Drink enough fluid to keep your urine pale yellow. General instructions  Keep a headache journal to help find out what may trigger your headaches. For example, write down: What you eat and drink. How much sleep you get. Any change to your diet or medicines. Try massage or other relaxation techniques. Limit stress. Sit up straight, and do not tense your muscles. Do not use any products that contain nicotine or tobacco. These products include cigarettes, chewing tobacco, and vaping devices, such as e-cigarettes. If you need help quitting, ask your health care provider. Exercise regularly as told by your health care provider. Sleep on a regular schedule. Get 7-9 hours of sleep each night, or the amount recommended by your health care provider. Keep all follow-up visits. This is important. Contact a health care provider if: Medicine does not help your symptoms. You have a headache that is different from your usual headache. You have nausea or you vomit. You have a fever. Get help right away if: Your headache: Becomes severe quickly. Gets worse after moderate to intense physical activity. You have any of these symptoms: Repeated vomiting. Pain or stiffness in your neck. Changes to your vision. Pain in an eye or ear. Problems with speech. Muscular weakness or loss of muscle control. Loss of balance or coordination. You feel faint or pass out. You have confusion. You have   a seizure. These symptoms may represent a serious problem that is an emergency. Do not wait to see if the symptoms will go away. Get medical help right away. Call your local emergency services (911 in the [place] Do not drive yourself to the hospital. Summary A headache is pain or discomfort felt around the head or neck area. There are many causes and types of headaches. In some cases, the cause may not be found. Keep a headache journal to help find out  what may trigger your headaches. Watch your condition for any changes. Let your health care provider know about them. Contact a health care provider if you have a headache that is different from the usual headache, or if your symptoms are not helped by medicine. Get help right away if your headache becomes severe, you vomit, you have a loss of vision, you lose your balance, or you have a seizure. This information is not intended to replace advice given to you by your health care provider. Make sure you discuss any questions you have with your health care provider. Document Revised: 01/03/2021 Document Reviewed: 01/03/2021 Elsevier Patient Education  2023 [employer]  

## 2022-09-20 NOTE — Telephone Encounter (Signed)
This is Dr. Kathalene Frames pt

## 2022-09-20 NOTE — Progress Notes (Unsigned)
[facility] clinic  Provider:   Code Status: *** Goals of Care:     06/24/2022    2:58 PM  Advanced Directives  Does Patient Have a Medical Advance Directive? Yes  Type of Paramedic of [place];Living will;Out of facility DNR (pink MOST or yellow form)  Copy of [place] in Chart? No - copy requested     Chief Complaint  Patient presents with   Acute Visit    Patient presents today for headache that started last night at bedtime. She denies taking anything for the headache.    HPI: Patient is a 89 y.o. female seen today for an acute visit for  Past Medical History:  Diagnosis Date   Bladder prolapse, female, acquired    Cancer [facility])    thyroid   Chest pain    no known ischemic heart disease; negative Myoview July 2013. EF 75% with no ischemia.    Chronic anticoagulation    Chronic atrial fibrillation (HCC)    managed with rate control and coumadin   Chronic diastolic heart failure (HCC)    GERD (gastroesophageal reflux disease)    Heart disease    History of congenital mitral regurgitation    mild   History of heart attack    Multiple    History of mitral valve prolapse 06/15/2008   a. echo 1/14: mild LVH, EF 60-65%, mild MR, mild to mod BAE, PASP 35   Hypercholesterolemia    Hypertension    Hypothyroidism    Stroke [facility])     Past Surgical History:  Procedure Laterality Date   ABDOMINAL HYSTERECTOMY     CATARACT EXTRACTION     CHOLECYSTECTOMY     Thyroidectomy     TONSILLECTOMY      Allergies  Allergen Reactions   Azithromycin Nausea Only and Other (See Comments)   Compazine [Prochlorperazine Edisylate] Nausea Only   Demerol Nausea Only   Dolophine [Methadone] Nausea Only   Ebastine Nausea Only    EBS   Erythromycin Nausea Only and Other (See Comments)   Gluten Meal Diarrhea   Ibuprofen Nausea Only and Other (See Comments)   Meperidine Hcl Other (See Comments)   Morphine Sulfate Other (See Comments)    Statins Other (See Comments)    myalgias   Tetanus Toxoids Nausea Only   Tetracycline Other (See Comments)   Tetracyclines & Related Nausea Only    Outpatient Encounter Medications as of 09/20/2022  Medication Sig   amLODipine (NORVASC) 2.5 MG tablet TAKE 2 TABLETS BY MOUTH  DAILY   apixaban (ELIQUIS) 2.5 MG TABS tablet Take 1 tablet (2.5 mg total) by mouth 2 (two) times daily.   atenolol (TENORMIN) 25 MG tablet Take 1 tablet (25 mg total) by mouth 2 (two) times daily.   dorzolamide (TRUSOPT) 2 % ophthalmic solution Place 1 drop into the left eye 2 (two) times daily.   ezetimibe (ZETIA) 10 MG tablet Take 1 tablet (10 mg total) by mouth daily.   furosemide (LASIX) 40 MG tablet Take 1 tablet (40 mg total) by mouth daily.   latanoprost (XALATAN) 0.005 % ophthalmic solution Place 1 drop into both eyes at bedtime.    levothyroxine (SYNTHROID) 75 MCG tablet Take 1 tablet (75 mcg total) by mouth daily at 6 (six) AM.   Multiple Vitamin (MULTIVITAMIN WITH MINERALS) TABS tablet Take 1 tablet by mouth daily.   nitroGLYCERIN (NITROSTAT) 0.4 MG SL tablet DISSOLVE 1 TABLET UNDER THE TONGUE EVERY 5 MINUTES FOR 3  DOSES AS NEEDED  FOR  CHEST  PAIN   pramipexole (MIRAPEX) 0.125 MG tablet TAKE 1 TABLET BY MOUTH AT  BEDTIME   PREMARIN vaginal cream Place 2 g vaginally 2 (two) times a week. Uses on Sunday and wednesday   Probiotic Product (PROBIOTIC DAILY PO) Take 1 tablet by mouth daily.   sodium chloride (MURO 128) 5 % ophthalmic solution Place 1 drop into the right eye at bedtime.    temazepam (RESTORIL) 7.5 MG capsule Take 1 capsule (7.5 mg total) by mouth at bedtime as needed for sleep.   omeprazole (PRILOSEC) 20 MG capsule Take 20 mg by mouth daily as needed (acid reflux). (Patient not taking: Reported on 09/20/2022)   No facility-administered encounter medications on file as of 09/20/2022.    Review of Systems:  Review of Systems  Constitutional:  Negative for appetite change, chills, fatigue and fever.   HENT:  Negative for congestion, hearing loss, rhinorrhea and sore throat.   Eyes: Negative.   Respiratory:  Negative for cough, shortness of breath and wheezing.   Cardiovascular:  Negative for chest pain, palpitations and leg swelling.  Gastrointestinal:  Negative for abdominal pain, constipation, diarrhea, nausea and vomiting.  Genitourinary:  Negative for dysuria.  Musculoskeletal:  Negative for arthralgias, back pain and myalgias.  Skin:  Negative for color change, rash and wound.  Neurological:  Negative for dizziness, weakness and headaches.  Psychiatric/Behavioral:  Negative for behavioral problems. The patient is not nervous/anxious.     Health Maintenance  Topic Date Due   DTaP/Tdap/Td (2 - Td or Tdap) 05/19/2021   COVID-19 Vaccine (5 - 2023-24 season) 08/12/2022   Medicare Annual Wellness (AWV)  05/07/2023   Pneumonia Vaccine 83+ Years old  Completed   INFLUENZA VACCINE  Completed   HPV VACCINES  Aged Out   DEXA SCAN  Discontinued   Zoster Vaccines- Shingrix  Discontinued    Physical Exam: Vitals:   09/20/22 1410  BP: 112/80  Pulse: 72  Temp: (!) 97.3 F (36.3 C)  SpO2: 97%  Weight: 121 lb 3.2 oz (55 kg)  Height: '4\' 11"'$  (1.499 m)   Body mass index is 24.48 kg/m. Physical Exam Constitutional:      Appearance: Normal appearance.  HENT:     Head: Normocephalic and atraumatic.     Nose: Nose normal.     Mouth/Throat:     Mouth: Mucous membranes are moist.  Eyes:     Conjunctiva/sclera: Conjunctivae normal.  Cardiovascular:     Rate and Rhythm: Normal rate and regular rhythm.  Pulmonary:     Effort: Pulmonary effort is normal.     Breath sounds: Normal breath sounds.  Abdominal:     General: Bowel sounds are normal.     Palpations: Abdomen is soft.  Musculoskeletal:        General: Normal range of motion.     Cervical back: Normal range of motion.  Skin:    General: Skin is warm and dry.  Neurological:     General: No focal deficit present.      Mental Status: She is alert and oriented to person, place, and time.  Psychiatric:        Mood and Affect: Mood normal.        Behavior: Behavior normal.        Thought Content: Thought content normal.        Judgment: Judgment normal.     Labs reviewed: Basic Metabolic Panel: Recent Labs    04/03/22 1114  05/20/22 1120 06/24/22 1624 07/08/22 1454 07/08/22 1738 07/08/22 2225 07/13/22 0325 07/15/22 0333 08/15/22 1139  NA  --   --    < >  --  139   < > 138 136 140  K  --   --    < >  --  3.5   < > 3.9 4.1 3.7  CL  --   --    < >  --  106   < > 106 104 101  CO2  --   --    < >  --  27   < > '24 24 30  '$ GLUCOSE  --   --    < >  --  125*   < > 93 120* 89  BUN  --   --    < >  --  9   < > 7* 16 12  CREATININE  --   --    < >  --  0.65   < > 0.73 0.77 0.75  CALCIUM  --   --    < >  --  7.6*   < > 7.5* 8.1* 8.1*  MG  --   --   --   --  1.8  --   --   --   --   TSH 0.11* 17.58*  --  19.195*  --   --   --   --   --    < > = values in this interval not displayed.   Liver Function Tests: Recent Labs    01/31/22 1051 08/15/22 1139  AST 23 25  ALT 15 20  BILITOT 0.5 0.6  PROT 6.3 7.0   No results for input(s): "LIPASE", "AMYLASE" in the last 8760 hours. No results for input(s): "AMMONIA" in the last 8760 hours. CBC: Recent Labs    06/24/22 1624 07/08/22 1448 07/10/22 0538 07/13/22 0325 07/15/22 0333 08/15/22 1139  WBC 6.5 7.3   < > 6.4 6.9 5.2  NEUTROABS 2,672 4.5  --   --   --  2,642  HGB 15.3 15.0   < > 13.9 13.0 14.2  HCT 44.7 45.3   < > 40.5 37.4 40.5  MCV 94.1 97.2   < > 94.8 94.9 96.4  PLT 238 230   < > 198 168 225   < > = values in this interval not displayed.   Lipid Panel: Recent Labs    07/09/22 0332  CHOL 207*  HDL 82  LDLCALC 108*  TRIG 84  CHOLHDL 2.5   Lab Results  Component Value Date   HGBA1C 5.8 (H) 07/09/2022    Procedures since last visit: No results found.  Assessment/Plan There are no diagnoses linked to this  encounter.   Labs/tests ordered:  * No order type specified * Next appt:  09/26/2022

## 2022-09-23 ENCOUNTER — Ambulatory Visit: Payer: Medicare Other | Admitting: Orthopedic Surgery

## 2022-09-24 MED ORDER — ATENOLOL 25 MG PO TABS: 25.0000 mg | ORAL_TABLET | Freq: Two times a day (BID) | ORAL | 3 refills | Status: DC

## 2022-09-24 NOTE — Addendum Note (Signed)
Addended by: Durenda Age C on: 09/24/2022 03:14 PM   Modules accepted: Level of Service

## 2022-09-26 ENCOUNTER — Ambulatory Visit: Payer: Medicare Other | Admitting: Adult Health

## 2022-09-26 ENCOUNTER — Ambulatory Visit: Payer: Medicare Other | Admitting: Orthopedic Surgery

## 2022-09-30 ENCOUNTER — Encounter: Payer: Self-pay | Admitting: Orthopedic Surgery

## 2022-09-30 ENCOUNTER — Telehealth (INDEPENDENT_AMBULATORY_CARE_PROVIDER_SITE_OTHER): Payer: Medicare Other | Admitting: Orthopedic Surgery

## 2022-09-30 DIAGNOSIS — M899 Disorder of bone, unspecified: Secondary | ICD-10-CM | POA: Diagnosis not present

## 2022-09-30 DIAGNOSIS — F5101 Primary insomnia: Secondary | ICD-10-CM

## 2022-09-30 DIAGNOSIS — K219 Gastro-esophageal reflux disease without esophagitis: Secondary | ICD-10-CM

## 2022-09-30 NOTE — Progress Notes (Signed)
Careteam: Patient Care Team: [name], NP as PCP - General (Adult Health Nurse Practitioner) O'Neal, Cassie Freer, MD as PCP - Cardiology (Cardiology) Rex Kras, Claudette Stapler, RN as [place] Management [place], Tillie Rung as [place], Cassie Freer, MD as Consulting Physician (Cardiology)  Seen by: [name], AGNP-C  PLACE OF SERVICE:  [place]  Advanced Directive information    Allergies  Allergen Reactions   Azithromycin Nausea Only and Other (See Comments)   Compazine [Prochlorperazine Edisylate] Nausea Only   Demerol Nausea Only   Dolophine [Methadone] Nausea Only   Ebastine Nausea Only    EBS   Erythromycin Nausea Only and Other (See Comments)   Gluten Meal Diarrhea   Ibuprofen Nausea Only and Other (See Comments)   Meperidine Hcl Other (See Comments)   Morphine Sulfate Other (See Comments)   Statins Other (See Comments)    myalgias   Tetanus Toxoids Nausea Only   Tetracycline Other (See Comments)   Tetracyclines & Related Nausea Only    Chief Complaint  Patient presents with   Acute Visit    Not sleeping well and is feeling better from being sick few weeks ago. Still feel little weak and has been on a soft diet.     HPI: Patient is a 89 y.o. female seen today via virtual visit due to insomnia.   Believes insomnia is related to cough associated with GERD. She started taking omeprazole daily and cough has subsided. Temazepam reduced to 7.5 mg last month. She reports it helps her sleep, but she is not sleeping through the night like before.   No recent falls. Ambulating without device. She has completed PT/OT.   Recent MRI report showed indeterminate lesions within the C2 and T1 vertebral bodies. Metastatic disease or multiple myeloma are suspected differentials. Full body scan scheduled 10/09/2022.    Review of Systems:  Review of Systems  Constitutional:  Negative for chills and fever.  Respiratory:   Positive for cough. Negative for shortness of breath and wheezing.   Cardiovascular:  Negative for chest pain and leg swelling.  Gastrointestinal:  Positive for heartburn.  Musculoskeletal:  Negative for falls and joint pain.  Neurological:  Positive for weakness. Negative for dizziness and headaches.  Psychiatric/Behavioral:  Negative for depression. The patient has insomnia.     Past Medical History:  Diagnosis Date   Bladder prolapse, female, acquired    Cancer [facility])    thyroid   Chest pain    no known ischemic heart disease; negative Myoview July 2013. EF 75% with no ischemia.    Chronic anticoagulation    Chronic atrial fibrillation (HCC)    managed with rate control and coumadin   Chronic diastolic heart failure (HCC)    GERD (gastroesophageal reflux disease)    Heart disease    History of congenital mitral regurgitation    mild   History of heart attack    Multiple    History of mitral valve prolapse 06/15/2008   a. echo 1/14: mild LVH, EF 60-65%, mild MR, mild to mod BAE, PASP 35   Hypercholesterolemia    Hypertension    Hypothyroidism    Stroke [facility])    Past Surgical History:  Procedure Laterality Date   ABDOMINAL HYSTERECTOMY     CATARACT EXTRACTION     CHOLECYSTECTOMY     Thyroidectomy     TONSILLECTOMY     Social History:   reports that she has never smoked. She  has never used smokeless tobacco. She reports that she does not drink alcohol and does not use drugs.  Family History  Problem Relation Age of Onset   Stroke Mother 50   Stroke Father 58   Arthritis Daughter    Gout Daughter    Immunocompromised Granddaughter    Heart attack Neg Hx    Heart disease Neg Hx     Medications: Patient's Medications  New Prescriptions   No medications on file  Previous Medications   ACETAMINOPHEN (TYLENOL) 500 MG TABLET    Take 2 tablets (1,000 mg total) by mouth every 8 (eight) hours as needed.   AMLODIPINE (NORVASC) 2.5 MG TABLET    TAKE 2 TABLETS BY MOUTH   DAILY   APIXABAN (ELIQUIS) 2.5 MG TABS TABLET    Take 1 tablet (2.5 mg total) by mouth 2 (two) times daily.   ATENOLOL (TENORMIN) 25 MG TABLET    Take 1 tablet (25 mg total) by mouth 2 (two) times daily.   DORZOLAMIDE (TRUSOPT) 2 % OPHTHALMIC SOLUTION    Place 1 drop into the left eye 2 (two) times daily.   EZETIMIBE (ZETIA) 10 MG TABLET    Take 1 tablet (10 mg total) by mouth daily.   FUROSEMIDE (LASIX) 40 MG TABLET    Take 1 tablet (40 mg total) by mouth daily.   LATANOPROST (XALATAN) 0.005 % OPHTHALMIC SOLUTION    Place 1 drop into both eyes at bedtime.    LEVOTHYROXINE (SYNTHROID) 75 MCG TABLET    Take 1 tablet (75 mcg total) by mouth daily at 6 (six) AM.   MULTIPLE VITAMIN (MULTIVITAMIN WITH MINERALS) TABS TABLET    Take 1 tablet by mouth daily.   NITROGLYCERIN (NITROSTAT) 0.4 MG SL TABLET    DISSOLVE 1 TABLET UNDER THE TONGUE EVERY 5 MINUTES FOR 3 DOSES AS NEEDED  FOR  CHEST  PAIN   OMEPRAZOLE (PRILOSEC) 20 MG CAPSULE    Take 20 mg by mouth daily as needed (acid reflux).   PRAMIPEXOLE (MIRAPEX) 0.125 MG TABLET    TAKE 1 TABLET BY MOUTH AT  BEDTIME   PREMARIN VAGINAL CREAM    Place 2 g vaginally 2 (two) times a week. Uses on Sunday and wednesday   PROBIOTIC PRODUCT (PROBIOTIC DAILY PO)    Take 1 tablet by mouth daily.   SODIUM CHLORIDE (MURO 128) 5 % OPHTHALMIC SOLUTION    Place 1 drop into the right eye at bedtime.    TEMAZEPAM (RESTORIL) 7.5 MG CAPSULE    Take 1 capsule (7.5 mg total) by mouth at bedtime as needed for sleep.  Modified Medications   No medications on file  Discontinued Medications   No medications on file    Physical Exam:  There were no vitals filed for this visit. There is no height or weight on file to calculate BMI. Wt Readings from Last 3 Encounters:  09/20/22 121 lb 3.2 oz (55 kg)  08/29/22 123 lb 4 oz (55.9 kg)  08/20/22 125 lb 6.4 oz (56.9 kg)    Physical Exam Vitals reviewed: Exam linited due to virtual visit.  Constitutional:      General: She is  not in acute distress. Neurological:     Mental Status: She is alert.     Labs reviewed: Basic Metabolic Panel: Recent Labs    04/03/22 1114 05/20/22 1120 06/24/22 1624 07/08/22 1454 07/08/22 1738 07/08/22 2225 07/13/22 0325 07/15/22 0333 08/15/22 1139  NA  --   --    < >  --  139   < > 138 136 140  K  --   --    < >  --  3.5   < > 3.9 4.1 3.7  CL  --   --    < >  --  106   < > 106 104 101  CO2  --   --    < >  --  27   < > 24 24 30  $ GLUCOSE  --   --    < >  --  125*   < > 93 120* 89  BUN  --   --    < >  --  9   < > 7* 16 12  CREATININE  --   --    < >  --  0.65   < > 0.73 0.77 0.75  CALCIUM  --   --    < >  --  7.6*   < > 7.5* 8.1* 8.1*  MG  --   --   --   --  1.8  --   --   --   --   TSH 0.11* 17.58*  --  19.195*  --   --   --   --   --    < > = values in this interval not displayed.   Liver Function Tests: Recent Labs    01/31/22 1051 08/15/22 1139  AST 23 25  ALT 15 20  BILITOT 0.5 0.6  PROT 6.3 7.0   No results for input(s): "LIPASE", "AMYLASE" in the last 8760 hours. No results for input(s): "AMMONIA" in the last 8760 hours. CBC: Recent Labs    06/24/22 1624 07/08/22 1448 07/10/22 0538 07/13/22 0325 07/15/22 0333 08/15/22 1139  WBC 6.5 7.3   < > 6.4 6.9 5.2  NEUTROABS 2,672 4.5  --   --   --  2,642  HGB 15.3 15.0   < > 13.9 13.0 14.2  HCT 44.7 45.3   < > 40.5 37.4 40.5  MCV 94.1 97.2   < > 94.8 94.9 96.4  PLT 238 230   < > 198 168 225   < > = values in this interval not displayed.   Lipid Panel: Recent Labs    07/09/22 0332  CHOL 207*  HDL 82  LDLCALC 108*  TRIG 84  CHOLHDL 2.5   TSH: Recent Labs    04/03/22 1114 05/20/22 1120 07/08/22 1454  TSH 0.11* 17.58* 19.195*   A1C: Lab Results  Component Value Date   HGBA1C 5.8 (H) 07/09/2022     Assessment/Plan 1. Primary insomnia - ongoing - Temazepam weaned to 7.5 mg 08/2022> sleeping but not through night - will give current temazepam dose another month before adjusting -  suspect recent episodes related to GERD  2. Gastroesophageal reflux disease without esophagitis - see above - cont omeprazole daily  3. Lesion of bone of cervical spine - followed by neurology - recent MRI report showed indeterminate lesions within the C2 and T1 vertebral bodies - concerns for metastatic disease or multiple myeloma - full body scan 10/09/2022  Virtual Visit   I connected with Kristen Whitehead by virtual visit and verified that I am speaking with the correct person using two identifiers.  White Patient: Kristen Whitehead Provider: [name], NP    I discussed the limitations, risks, security and privacy concerns of performing an evaluation and management service by telephone and the availability of in person appointments.  I also discussed with the patient that there may be a patient responsible charge related to this service. The patient expressed understanding and agreed to proceed.   I discussed the assessment and treatment plan with the patient. The patient was provided an opportunity to ask questions and all were answered. The patient agreed with the plan and demonstrated an understanding of the instructions.   The patient was advised to call back or seek an in-person evaluation if the symptoms worsen or if the condition fails to improve as anticipated.  I provided 11 minutes of non-face-to-face time during this encounter.  Grae Leathers Cleophas Dunker, [place] printed and mailed    Next appt: none Eliberto Sole [place], [facility] [phone]

## 2022-09-30 NOTE — Patient Instructions (Signed)
Continue omeprazole for cough and acid reflux  Schedule follow up with Brylea Pita in 2 months- plan to check thyroid level that visit  Contact provider if new temazepam dose not helping with sleep

## 2022-09-30 NOTE — Progress Notes (Signed)
This service is provided via telemedicine  No vital signs collected/recorded due to the encounter was a telemedicine visit.   Location of patient (ex: home, work):  Home   Patient consents to a telephone visit:  Yes, see telephone encounter dated   Location of the provider (ex: office, home):  [facility]  Name of any referring provider:  N/A  Names of all persons participating in the telemedicine service and their role in the encounter: Ricci Dirocco B/CMA, [name], NP, and patient  Time spent on call:  11 minutes

## 2022-10-01 ENCOUNTER — Other Ambulatory Visit: Payer: Self-pay | Admitting: *Deleted

## 2022-10-01 DIAGNOSIS — F5101 Primary insomnia: Secondary | ICD-10-CM

## 2022-10-01 MED ORDER — TEMAZEPAM 7.5 MG PO CAPS: 7.5000 mg | ORAL_CAPSULE | Freq: Every evening | ORAL | 3 refills | Status: DC | PRN

## 2022-10-01 NOTE — Telephone Encounter (Signed)
Patient called requesting refill to be sent to [facility] in [place] so family could pick up her Rx on their way home.   Pended Rx and sent to Amy for approval.

## 2022-10-02 DIAGNOSIS — R2689 Other abnormalities of gait and mobility: Secondary | ICD-10-CM | POA: Diagnosis not present

## 2022-10-02 DIAGNOSIS — I69398 Other sequelae of cerebral infarction: Secondary | ICD-10-CM | POA: Diagnosis not present

## 2022-10-02 DIAGNOSIS — R2681 Unsteadiness on feet: Secondary | ICD-10-CM | POA: Diagnosis not present

## 2022-10-02 DIAGNOSIS — M6281 Muscle weakness (generalized): Secondary | ICD-10-CM | POA: Diagnosis not present

## 2022-10-03 ENCOUNTER — Inpatient Hospital Stay (HOSPITAL_COMMUNITY): Admission: RE | Admit: 2022-10-03 | Payer: Medicare Other | Source: Ambulatory Visit

## 2022-10-03 ENCOUNTER — Encounter (HOSPITAL_COMMUNITY): Payer: Medicare Other

## 2022-10-09 ENCOUNTER — Encounter (HOSPITAL_COMMUNITY)
Admission: RE | Admit: 2022-10-09 | Discharge: 2022-10-09 | Disposition: A | Discharge status: Home / Self Care | Payer: Medicare Other | Source: Ambulatory Visit | Attending: Adult Health | Admitting: Adult Health

## 2022-10-09 DIAGNOSIS — M899 Disorder of bone, unspecified: Secondary | ICD-10-CM | POA: Insufficient documentation

## 2022-10-09 DIAGNOSIS — I4891 Unspecified atrial fibrillation: Secondary | ICD-10-CM | POA: Diagnosis not present

## 2022-10-09 DIAGNOSIS — E039 Hypothyroidism, unspecified: Secondary | ICD-10-CM | POA: Diagnosis not present

## 2022-10-09 DIAGNOSIS — Z8669 Personal history of other diseases of the nervous system and sense organs: Secondary | ICD-10-CM | POA: Diagnosis not present

## 2022-10-09 DIAGNOSIS — I10 Essential (primary) hypertension: Secondary | ICD-10-CM | POA: Diagnosis not present

## 2022-10-09 MED ORDER — TECHNETIUM TC 99M MEDRONATE IV KIT: 51.8000 | PACK | Freq: Once | INTRAVENOUS | Status: DC | PRN

## 2022-10-09 MED ORDER — TECHNETIUM TC 99M MEDRONATE IV KIT
21.8000 | PACK | Freq: Once | INTRAVENOUS | Status: AC | PRN
  Administered 2022-10-09: 21.8 via INTRAVENOUS

## 2022-10-11 NOTE — Progress Notes (Signed)
Bone scan showed "Questionable sites of abnormal tracer uptake at the femoral diaphyses bilaterally; these sites could be assessed by follow-up BILATERAL femoral radiographs if clinically indicated based on patient age and comorbidities."  -  will need to follow up in the office for assessment if neeing more images of bilateral femur.

## 2022-10-14 DIAGNOSIS — H1820 Unspecified corneal edema: Secondary | ICD-10-CM | POA: Diagnosis not present

## 2022-10-14 DIAGNOSIS — H353211 Exudative age-related macular degeneration, right eye, with active choroidal neovascularization: Secondary | ICD-10-CM | POA: Diagnosis not present

## 2022-10-14 DIAGNOSIS — H04123 Dry eye syndrome of bilateral lacrimal glands: Secondary | ICD-10-CM | POA: Diagnosis not present

## 2022-10-17 ENCOUNTER — Ambulatory Visit (INDEPENDENT_AMBULATORY_CARE_PROVIDER_SITE_OTHER): Payer: Medicare Other | Admitting: Adult Health

## 2022-10-17 ENCOUNTER — Encounter: Payer: Self-pay | Admitting: Adult Health

## 2022-10-17 VITALS — BP 124/68 | HR 68 | Temp 97.7°F | Resp 17 | Ht 59.0 in | Wt 118.8 lb

## 2022-10-17 DIAGNOSIS — E039 Hypothyroidism, unspecified: Secondary | ICD-10-CM | POA: Diagnosis not present

## 2022-10-17 DIAGNOSIS — I10 Essential (primary) hypertension: Secondary | ICD-10-CM

## 2022-10-17 DIAGNOSIS — R5383 Other fatigue: Secondary | ICD-10-CM | POA: Diagnosis not present

## 2022-10-17 DIAGNOSIS — G959 Disease of spinal cord, unspecified: Secondary | ICD-10-CM

## 2022-10-17 DIAGNOSIS — Z8673 Personal history of transient ischemic attack (TIA), and cerebral infarction without residual deficits: Secondary | ICD-10-CM | POA: Diagnosis not present

## 2022-10-17 NOTE — Progress Notes (Signed)
[facility] clinic  Provider: Durenda Age DNP  Code Status:  Full Code  Goals of Care:     10/17/2022    3:34 PM  Advanced Directives  Does Patient Have a Medical Advance Directive? Yes  Type of Advance Directive Living will;Out of facility DNR (pink MOST or yellow form)  Does patient want to make changes to medical advance directive? No - Patient declined     Chief Complaint  Patient presents with   Follow-up    To discuss lab results.    HPI: Patient is a 89 y.o. female seen today for an acute visit for lab result discussion. She was accompanied today by her granddaughter. Whole body scan was done on 10/09/22 which showed scattered areas of degenerative type uptake. Questionable sites of abnormal tracer uptake at the femoral diaphyses bilaterally. A recent C-spine MRI report from [facility] showed indeterminate lesions within the C2 and T1 vertebral bodies. Metastatic disease or multiple myeloma are within the differential. Alternatively, these could represent benign lesions.  Patient denies having pain today.    Past Medical History:  Diagnosis Date   Bladder prolapse, female, acquired    Cancer [facility])    thyroid   Chest pain    no known ischemic heart disease; negative Myoview July 2013. EF 75% with no ischemia.    Chronic anticoagulation    Chronic atrial fibrillation (HCC)    managed with rate control and coumadin   Chronic diastolic heart failure (HCC)    GERD (gastroesophageal reflux disease)    Heart disease    History of congenital mitral regurgitation    mild   History of heart attack    Multiple    History of mitral valve prolapse 06/15/2008   a. echo 1/14: mild LVH, EF 60-65%, mild MR, mild to mod BAE, PASP 35   Hypercholesterolemia    Hypertension    Hypothyroidism    Stroke [facility])     Past Surgical History:  Procedure Laterality Date   ABDOMINAL HYSTERECTOMY     CATARACT EXTRACTION     CHOLECYSTECTOMY     Thyroidectomy     TONSILLECTOMY       Allergies  Allergen Reactions   Azithromycin Nausea Only and Other (See Comments)   Compazine [Prochlorperazine Edisylate] Nausea Only   Demerol Nausea Only   Dolophine [Methadone] Nausea Only   Ebastine Nausea Only    EBS   Erythromycin Nausea Only and Other (See Comments)   Gluten Meal Diarrhea   Ibuprofen Nausea Only and Other (See Comments)   Meperidine Hcl Other (See Comments)   Morphine Sulfate Other (See Comments)   Statins Other (See Comments)    myalgias   Tetanus Toxoids Nausea Only   Tetracycline Other (See Comments)   Tetracyclines & Related Nausea Only    Outpatient Encounter Medications as of 10/17/2022  Medication Sig   acetaminophen (TYLENOL) 500 MG tablet Take 2 tablets (1,000 mg total) by mouth every 8 (eight) hours as needed.   amLODipine (NORVASC) 2.5 MG tablet TAKE 2 TABLETS BY MOUTH  DAILY   apixaban (ELIQUIS) 2.5 MG TABS tablet Take 1 tablet (2.5 mg total) by mouth 2 (two) times daily.   atenolol (TENORMIN) 25 MG tablet Take 1 tablet (25 mg total) by mouth 2 (two) times daily.   dorzolamide (TRUSOPT) 2 % ophthalmic solution Place 1 drop into the left eye 2 (two) times daily.   ezetimibe (ZETIA) 10 MG tablet Take 1 tablet (10 mg total) by mouth daily.  furosemide (LASIX) 40 MG tablet Take 1 tablet (40 mg total) by mouth daily.   latanoprost (XALATAN) 0.005 % ophthalmic solution Place 1 drop into both eyes at bedtime.    levothyroxine (SYNTHROID) 75 MCG tablet Take 1 tablet (75 mcg total) by mouth daily at 6 (six) AM.   Multiple Vitamin (MULTIVITAMIN WITH MINERALS) TABS tablet Take 1 tablet by mouth daily.   nitroGLYCERIN (NITROSTAT) 0.4 MG SL tablet DISSOLVE 1 TABLET UNDER THE TONGUE EVERY 5 MINUTES FOR 3 DOSES AS NEEDED  FOR  CHEST  PAIN   omeprazole (PRILOSEC) 20 MG capsule Take 20 mg by mouth daily as needed (acid reflux).   pramipexole (MIRAPEX) 0.125 MG tablet TAKE 1 TABLET BY MOUTH AT  BEDTIME   PREMARIN vaginal cream Place 2 g vaginally 2 (two)  times a week. Uses on Sunday and wednesday   Probiotic Product (PROBIOTIC DAILY PO) Take 1 tablet by mouth daily.   sodium chloride (MURO 128) 5 % ophthalmic solution Place 1 drop into the right eye at bedtime.    temazepam (RESTORIL) 7.5 MG capsule Take 1 capsule (7.5 mg total) by mouth at bedtime as needed for sleep.   No facility-administered encounter medications on file as of 10/17/2022.    Review of Systems:  Review of Systems  Health Maintenance  Topic Date Due   DTaP/Tdap/Td (2 - Td or Tdap) 05/19/2021   COVID-19 Vaccine (5 - 2023-24 season) 04/18/2023 (Originally 08/12/2022)   Medicare Annual Wellness (AWV)  05/07/2023   Pneumonia Vaccine 66+ Years old  Completed   INFLUENZA VACCINE  Completed   HPV VACCINES  Aged Out   DEXA SCAN  Discontinued   Zoster Vaccines- Shingrix  Discontinued    Physical Exam: Vitals:   10/17/22 1532  BP: 124/68  Pulse: 68  Resp: 17  Temp: 97.7 F (36.5 C)  TempSrc: Temporal  SpO2: 97%  Weight: 118 lb 12.8 oz (53.9 kg)  Height: '4\' 11"'$  (1.499 m)   Body mass index is 23.99 kg/m. Physical Exam  Labs reviewed: Basic Metabolic Panel: Recent Labs    04/03/22 1114 05/20/22 1120 06/24/22 1624 07/08/22 1454 07/08/22 1738 07/08/22 2225 07/13/22 0325 07/15/22 0333 08/15/22 1139  NA  --   --    < >  --  139   < > 138 136 140  K  --   --    < >  --  3.5   < > 3.9 4.1 3.7  CL  --   --    < >  --  106   < > 106 104 101  CO2  --   --    < >  --  27   < > '24 24 30  '$ GLUCOSE  --   --    < >  --  125*   < > 93 120* 89  BUN  --   --    < >  --  9   < > 7* 16 12  CREATININE  --   --    < >  --  0.65   < > 0.73 0.77 0.75  CALCIUM  --   --    < >  --  7.6*   < > 7.5* 8.1* 8.1*  MG  --   --   --   --  1.8  --   --   --   --   TSH 0.11* 17.58*  --  19.195*  --   --   --   --   --    < > =  values in this interval not displayed.   Liver Function Tests: Recent Labs    01/31/22 1051 08/15/22 1139  AST 23 25  ALT 15 20  BILITOT 0.5 0.6  PROT  6.3 7.0   No results for input(s): "LIPASE", "AMYLASE" in the last 8760 hours. No results for input(s): "AMMONIA" in the last 8760 hours. CBC: Recent Labs    06/24/22 1624 07/08/22 1448 07/10/22 0538 07/13/22 0325 07/15/22 0333 08/15/22 1139  WBC 6.5 7.3   < > 6.4 6.9 5.2  NEUTROABS 2,672 4.5  --   --   --  2,642  HGB 15.3 15.0   < > 13.9 13.0 14.2  HCT 44.7 45.3   < > 40.5 37.4 40.5  MCV 94.1 97.2   < > 94.8 94.9 96.4  PLT 238 230   < > 198 168 225   < > = values in this interval not displayed.   Lipid Panel: Recent Labs    07/09/22 0332  CHOL 207*  HDL 82  LDLCALC 108*  TRIG 84  CHOLHDL 2.5   Lab Results  Component Value Date   HGBA1C 5.8 (H) 07/09/2022    Procedures since last visit: NM Bone Scan Whole Body  Result Date: 10/11/2022 CLINICAL DATA:  Vertebral lesions at C2 and T1, abnormal MRI cervical spine. History of fall in December 2023. History thyroid cancer, melanoma, atrial fibrillation, chronic diastolic heart failure, hypertension, stroke EXAM: NUCLEAR MEDICINE WHOLE BODY BONE SCAN TECHNIQUE: Whole body anterior and posterior images were obtained approximately 3 hours after intravenous injection of radiopharmaceutical. RADIOPHARMACEUTICALS:  21.8 mCi Technetium-24mMDP IV COMPARISON:  None available Radiographic correlation: CT 07/08/2022 FINDINGS: Uptake at shoulders, RIGHT sternoclavicular joint, hips, knees, wrists, typically degenerative. Area of increased tracer posteriorly at T3 vertebral body likely reflects shine through from thyroid activity. Uptake in lumbar spine at L4-L5 RIGHT, minimally L3-L4, favor degenerative. Questionable foci of increased tracer uptake at the femoral diaphyses bilaterally, distal RIGHT femur, mid LEFT femur, nonspecific. No additional sites of worrisome tracer uptake are seen. Specifically no abnormal tracer uptake is seen at C2 or T1. Expected urinary tract and soft tissue distribution of tracer. IMPRESSION: Scattered areas of  degenerative type uptake as above. Questionable sites of abnormal tracer uptake at the femoral diaphyses bilaterally; these sites could be assessed by follow-up BILATERAL femoral radiographs if clinically indicated based on patient age and comorbidities. Electronically Signed   By: MLavonia DanaM.D.   On: 10/11/2022 09:08    Assessment/Plan  1. Spinal cord lesion ([place] -  ambulatory referral to oncology - Complete Metabolic Panel with eGFR - CBC With Differential/Platelet - C-reactive Protein  -  elevated - Sedimentation Rate  elevated - TSH  2. History of CVA (cerebrovascular accident) -  stable -  continue Eliquis and Zetia  3. Essential hypertension -  BPs stable -  continue Amlodipine and Atenolol  4.  Hypothyroidism -  TSH 0.24, low -  currently taking Levothyroxine 75 mcg daily -  will decrease Levothyroxine to 50 mcg daily, eRX sent to pharmacy -  tsh in 6 weeks     Labs/tests ordered:   - Complete Metabolic Panel with eGFR - CBC With Differential/Platelet - C-reactive Protein  - Sedimentation Rate - TSH  Next appt:  Visit date not found

## 2022-10-18 LAB — COMPLETE METABOLIC PANEL WITH GFR
AG Ratio: 1.4 (calc) (ref 1.0–2.5)
ALT: 13 U/L (ref 6–29)
AST: 19 U/L (ref 10–35)
Albumin: 3.8 g/dL (ref 3.6–5.1)
Alkaline phosphatase (APISO): 73 U/L (ref 37–153)
BUN: 11 mg/dL (ref 7–25)
CO2: 35 mmol/L — ABNORMAL HIGH (ref 20–32)
Calcium: 8.7 mg/dL (ref 8.6–10.4)
Chloride: 99 mmol/L (ref 98–110)
Creat: 0.66 mg/dL (ref 0.60–0.95)
Globulin: 2.7 g/dL (calc) (ref 1.9–3.7)
Glucose, Bld: 92 mg/dL (ref 65–99)
Potassium: 3.7 mmol/L (ref 3.5–5.3)
Sodium: 141 mmol/L (ref 135–146)
Total Bilirubin: 0.5 mg/dL (ref 0.2–1.2)
Total Protein: 6.5 g/dL (ref 6.1–8.1)
eGFR: 80 mL/min/{1.73_m2} (ref >=60)

## 2022-10-18 LAB — CBC WITH DIFFERENTIAL/PLATELET
Absolute Monocytes: 739 cells/uL (ref 200–950)
Basophils Absolute: 20 cells/uL (ref 0–200)
Basophils Relative: 0.3 %
Eosinophils Absolute: 251 cells/uL (ref 15–500)
Eosinophils Relative: 3.8 %
HCT: 42.4 % (ref 35.0–45.0)
Hemoglobin: 14.1 g/dL (ref 11.7–15.5)
Lymphs Abs: 2422 cells/uL (ref 850–3900)
MCH: 31.8 pg (ref 27.0–33.0)
MCHC: 33.3 g/dL (ref 32.0–36.0)
MCV: 95.5 fL (ref 80.0–100.0)
MPV: 10.2 fL (ref 7.5–12.5)
Monocytes Relative: 11.2 %
Neutro Abs: 3168 cells/uL (ref 1500–7800)
Neutrophils Relative %: 48 %
Platelets: 262 10*3/uL (ref 140–400)
RBC: 4.44 10*6/uL (ref 3.80–5.10)
RDW: 12.2 % (ref 11.0–15.0)
Total Lymphocyte: 36.7 %
WBC: 6.6 10*3/uL (ref 3.8–10.8)

## 2022-10-18 LAB — TSH: TSH: 0.24 mIU/L — ABNORMAL LOW (ref 0.40–4.50)

## 2022-10-18 LAB — SEDIMENTATION RATE: Sed Rate: 43 mm/h — ABNORMAL HIGH (ref 0–30)

## 2022-10-18 LAB — C-REACTIVE PROTEIN: CRP: 18.6 mg/L — ABNORMAL HIGH (ref <8.0)

## 2022-10-20 MED ORDER — LEVOTHYROXINE SODIUM 50 MCG PO TABS: 50.0000 ug | ORAL_TABLET | Freq: Every day | ORAL | 3 refills | Status: DC

## 2022-10-20 NOTE — Progress Notes (Signed)
-    tsh 0.24, low (normal 0.4 to 4.5), decrease Levothyroxine from 75 mcg daily to 50 mcg daily, sent Rx to pharmacy, tsh in 6 weeks (around 11/25/22) -  sed rate 43, elevated (normal 0-30) and CRP 18.6, elevated (normal <8.0) -  referred to [facility] -CBC and CMP normal

## 2022-10-21 ENCOUNTER — Telehealth: Payer: Self-pay | Admitting: Physician Assistant

## 2022-10-21 NOTE — Telephone Encounter (Signed)
Scheduled appt per 03/04 referral . Pt is aware of appt date and time. Pt is aware to arrive 15 mins prior to appt time and to bring and updated insurance card. Pt is aware of appt location.

## 2022-10-22 NOTE — Progress Notes (Unsigned)
Kristen Whitehead [phone]   [phone]  INITIAL CONSULTATION:  Patient Care Team: [name], NP as PCP - General (Adult Health Nurse Practitioner) O'Neal, Cassie Freer, MD as PCP - Cardiology (Cardiology) Rex Kras, Claudette Stapler, RN as [facility] [place], [place] as [place], Cassie Freer, MD as Consulting Physician (Cardiology)  CHIEF COMPLAINTS/PURPOSE OF CONSULTATION:  "Spinal cord lesions "  HISTORY OF PRESENTING ILLNESS:  Kristen Whitehead 89 y.o. female with medical history significant for thyroid cancer, melanoma, atrial fibrillation, chronic diastolic heart failure, hypertension, CVA  On review of the previous records hospitalized from 11/20-11/27/23 for small acute infarcts in left midbrain and right medial pons due to basilar tip thrombus, suspected to be cardio embolic in the setting of atrial fibrillation off anticoagulation, now on Eliquis. Patient followed up outpatient with [place] neurologist Dr. Star Age when she reported left sided neck discomfort x 6 months therefore had MRI cervical spine on 09/10/22 showing indeterminate lesions within the C2 and T1 vertebral bodies. Metastatic disease, multiple myeloma, or benign lesions are within the differential. PCP then ordered whole body bone scan on 10/09/22 which reveals Scattered areas of degenerative type uptake at shoulders, right sternoclavicular joint, hips, knees, wrists. No abnormal tracer uptake at C2 or T1. Questionable sites of abnormal tracer uptake at the femoral diaphyses bilaterally.  On exam today patient reports she is feeling well overall.  She is accompanied by her son who provides additional history.  Patient admits to ongoing headaches and neck pain.  She states the headaches were initially worse immediately after her stroke however they have significantly improved.  She will take aspirin  and the headaches resolve.  She denies any associated visual changes.  Last night she had pain over her left ear that was radiating from her neck.  Again she took an aspirin and was able to sleep pain-free.  She denies any fall or injury.  She is ambulatory without assistance at home, only uses a walker when out in public.  Patient states her baseline weight is 120 and fluctuates with Lasix.  She is only having to take Lasix once a day instead of twice and her swelling is well-controlled.  Patient denies being in any pain right now. She lives with family and completes ADLs independently. She denies any fever, chills, unintentional weight loss, numbness, weakness, tingling. She has no urinary symptoms. Has long standing history of constipation, currently unchanged.  Patient states her last colonoscopy was at age 68.  She had polyps otherwise they were benign.  Patient does not remove when her last mammogram was although states they have always been normal.  Patient denies any family history of cancer.  MEDICAL HISTORY:  Past Medical History:  Diagnosis Date   Bladder prolapse, female, acquired    Cancer [facility])    thyroid   Chest pain    no known ischemic heart disease; negative Myoview July 2013. EF 75% with no ischemia.    Chronic anticoagulation    Chronic atrial fibrillation (HCC)    managed with rate control and coumadin   Chronic diastolic heart failure (HCC)    GERD (gastroesophageal reflux disease)    Heart disease    History of congenital mitral regurgitation    mild   History of heart attack    Multiple    History of mitral valve prolapse 06/15/2008   a. echo 1/14: mild LVH, EF 60-65%, mild MR, mild to  mod BAE, PASP 35   Hypercholesterolemia    Hypertension    Hypothyroidism    Stroke [facility])     SURGICAL HISTORY: Past Surgical History:  Procedure Laterality Date   ABDOMINAL HYSTERECTOMY     CATARACT EXTRACTION     CHOLECYSTECTOMY     Thyroidectomy     TONSILLECTOMY       SOCIAL HISTORY: Social History   Socioeconomic History   Marital status: Widowed    Spouse name: Not on file   Number of children: 3   Years of education: BA   Highest education level: Not on file  Occupational History    Employer: RETIRED   Occupation: Retired [employer]  Tobacco Use   Smoking status: Never   Smokeless tobacco: Never  Vaping Use   Vaping Use: Never used  Substance and Sexual Activity   Alcohol use: No   Drug use: No   Sexual activity: Not Currently  Other Topics Concern   Not on file  Social History Narrative   Pt lives at home alone.   Caffeine Use: quit 84yr ago      Diet: No FMaceo Pro     Do you drink/ eat things with caffeineNo      Marital status:   Widowed                            What year were you married ? 1947      Do you live in a house, apartment,assistred living, condo, trailer, etc.)[place]     Is it one or more stories? 1      How many persons live in your home ? Me      Do you have any pets in your home ?(please list) No      Highest Level of education completed:  BA [employer]     Current or past profession:  TPharmacist, hospital Office, [employer]      Do you exercise?  No                            Type & how often       ADVANCED DIRECTIVES (Please bring copies)      Do you have a living will? Yes      Do you have a DNR form?   Yes                    If not, do you want to discuss one?       Do you have signed POA?HPOA forms?  Yes               If so, please bring to your appointment      FUNCTIONAL STATUS- To be completed by Spouse / child / Staff       Do you have difficulty bathing or dressing yourself ?  No      Do you have difficulty preparing food or eating ?  No      Do you have difficulty managing your mediation ?  No      Do you have difficulty managing your finances ?  No      Do you have difficulty affording your medication ?  No      Social Determinants of Health   Financial Resource  Strain: Low Risk  (05/06/2022)   Overall Financial  Resource Strain (CARDIA)    Difficulty of Paying Living Expenses: Not hard at all  Food Insecurity: No Food Insecurity (05/06/2022)   Hunger Vital Sign    Worried About Running Out of Food in the Last Year: Never true    Ran Out of Food in the Last Year: Never true  Transportation Needs: No Transportation Needs (05/06/2022)   PRAPARE - Hydrologist (Medical): No    Lack of Transportation (Non-Medical): No  Physical Activity: Insufficiently Active (05/06/2022)   Exercise Vital Sign    Days of Exercise per Week: 2 days    Minutes of Exercise per Session: 10 min  Stress: No Stress Concern Present (05/06/2022)   [place]    Feeling of Stress : Not at all  Social Connections: Moderately Integrated (05/06/2022)   Social Connection and Isolation Panel [NHANES]    Frequency of Communication with Friends and Family: Three times a week    Frequency of Social Gatherings with Friends and Family: Three times a week    Attends Religious Services: 1 to 4 times per year    Active Member of Clubs or Organizations: Yes    Attends Archivist Meetings: More than 4 times per year    Marital Status: Widowed  Intimate Partner Violence: Not At Risk (08/07/2021)   Humiliation, Afraid, Rape, and Kick questionnaire    Fear of Current or Ex-Partner: No    Emotionally Abused: No    Physically Abused: No    Sexually Abused: No    FAMILY HISTORY: Family History  Problem Relation Age of Onset   Stroke Mother 7   Stroke Father 63   Arthritis Daughter    Gout Daughter    Immunocompromised Granddaughter    Heart attack Neg Hx    Heart disease Neg Hx     ALLERGIES:  is allergic to azithromycin, compazine [prochlorperazine edisylate], demerol, dolophine [methadone], ebastine, erythromycin, gluten meal, ibuprofen, meperidine hcl, morphine sulfate, statins,  tetanus toxoids, tetracycline, and tetracyclines & related.  MEDICATIONS:  Current Outpatient Medications  Medication Sig Dispense Refill   acetaminophen (TYLENOL) 500 MG tablet Take 2 tablets (1,000 mg total) by mouth every 8 (eight) hours as needed. 30 tablet 0   amLODipine (NORVASC) 2.5 MG tablet TAKE 2 TABLETS BY MOUTH  DAILY 180 tablet 3   apixaban (ELIQUIS) 2.5 MG TABS tablet Take 1 tablet (2.5 mg total) by mouth 2 (two) times daily. 180 tablet 1   atenolol (TENORMIN) 25 MG tablet Take 1 tablet (25 mg total) by mouth 2 (two) times daily. 180 tablet 3   dorzolamide (TRUSOPT) 2 % ophthalmic solution Place 1 drop into the left eye 2 (two) times daily.     ezetimibe (ZETIA) 10 MG tablet Take 1 tablet (10 mg total) by mouth daily. 30 tablet 10   furosemide (LASIX) 40 MG tablet Take 1 tablet (40 mg total) by mouth daily. 90 tablet 3   latanoprost (XALATAN) 0.005 % ophthalmic solution Place 1 drop into both eyes at bedtime.      levothyroxine (SYNTHROID) 50 MCG tablet Take 1 tablet (50 mcg total) by mouth daily. 90 tablet 3   Multiple Vitamin (MULTIVITAMIN WITH MINERALS) TABS tablet Take 1 tablet by mouth daily. 30 tablet 0   nitroGLYCERIN (NITROSTAT) 0.4 MG SL tablet DISSOLVE 1 TABLET UNDER THE TONGUE EVERY 5 MINUTES FOR 3 DOSES AS NEEDED  FOR  CHEST  PAIN 50 tablet 2  omeprazole (PRILOSEC) 20 MG capsule Take 20 mg by mouth daily as needed (acid reflux).     pramipexole (MIRAPEX) 0.125 MG tablet TAKE 1 TABLET BY MOUTH AT  BEDTIME 90 tablet 3   PREMARIN vaginal cream Place 2 g vaginally 2 (two) times a week. Uses on Sunday and wednesday     Probiotic Product (PROBIOTIC DAILY PO) Take 1 tablet by mouth daily.     sodium chloride (MURO 128) 5 % ophthalmic solution Place 1 drop into the right eye at bedtime.      temazepam (RESTORIL) 7.5 MG capsule Take 1 capsule (7.5 mg total) by mouth at bedtime as needed for sleep. 30 capsule 3   No current facility-administered medications for this visit.     REVIEW OF SYSTEMS:   All other systems are reviewed and are negative for acute change except as noted in the HPI.  PHYSICAL EXAMINATION: ECOG PERFORMANCE STATUS: 1 - Symptomatic but completely ambulatory  Vitals:   10/23/22 1119  BP: (!) 140/69  Pulse: 76  Resp: 16  Temp: 97.8 F (36.6 C)  SpO2: 95%   Filed Weights   10/23/22 1119  Weight: 116 lb 6.4 oz (52.8 kg)    Physical Exam Vitals reviewed.  Constitutional:      General: She is not in acute distress.    Appearance: Normal appearance. She is not ill-appearing or diaphoretic.  HENT:     Head: Normocephalic.     Right Ear: External ear normal.     Left Ear: External ear normal.     Mouth/Throat:     Mouth: Mucous membranes are moist.     Pharynx: Oropharynx is clear.  Eyes:     General: No scleral icterus.    Conjunctiva/sclera: Conjunctivae normal.  Neck:     Comments: Full ROM intact without spinous process TTP. No bony stepoffs or deformities, no paraspinous muscle TTP or muscle spasms. No rigidity or meningeal signs. No bruising, erythema, or swelling.   Cardiovascular:     Rate and Rhythm: Normal rate and regular rhythm.     Heart sounds: Murmur heard.  Pulmonary:     Effort: Pulmonary effort is normal.     Breath sounds: Normal breath sounds.  Chest:     Comments: Breast exam performed by MD Abdominal:     Palpations: Abdomen is soft.     Tenderness: There is no abdominal tenderness.  Musculoskeletal:        General: Normal range of motion.     Cervical back: Normal range of motion.     Right lower leg: No edema.     Left lower leg: No edema.     Comments: No thoracic, or lumbar spinal tenderness to palpation.  No paraspinal tenderness. No step offs, crepitus or deformity palpated.    Skin:    General: Skin is warm and dry.  Neurological:     Mental Status: She is alert and oriented to person, place, and time.     Comments: Speech is clear and goal oriented, follows commands CN III-XII  intact, no facial droop Normal strength in upper and lower extremities bilaterally including dorsiflexion and plantar flexion, strong and equal grip strength Sensation normal to light and sharp touch Moves extremities without ataxia, coordination intact Normal finger to nose and rapid alternating movements Normal gait and balance No saddle anesthesia  Psychiatric:        Mood and Affect: Mood normal.       LABORATORY DATA:  I have  reviewed the data as listed    Latest Ref Rng & Units 10/23/2022   12:33 PM 10/17/2022    4:13 PM 08/15/2022   11:39 AM  CBC  WBC 4.0 - 10.5 K/uL 7.6  6.6  5.2   Hemoglobin 12.0 - 15.0 g/dL 14.7  14.1  14.2   Hematocrit 36.0 - 46.0 % 42.8  42.4  40.5   Platelets 150 - 400 K/uL 186  262  225        Latest Ref Rng & Units 10/23/2022   12:33 PM 10/17/2022    4:13 PM 08/15/2022   11:39 AM  CMP  Glucose 70 - 99 mg/dL 97  92  89   BUN 8 - 23 mg/dL '25  11  12   '$ Creatinine 0.44 - 1.00 mg/dL 0.78  0.66  0.75   Sodium 135 - 145 mmol/L 136  141  140   Potassium 3.5 - 5.1 mmol/L 3.7  3.7  3.7   Chloride 98 - 111 mmol/L 98  99  101   CO2 22 - 32 mmol/L 30  35  30   Calcium 8.9 - 10.3 mg/dL 8.5  8.7  8.1   Total Protein 6.5 - 8.1 g/dL 7.5  6.5  7.0   Total Bilirubin 0.3 - 1.2 mg/dL 0.6  0.5  0.6   Alkaline Phos 38 - 126 U/L 79     AST 15 - 41 U/L '23  19  25   '$ ALT 0 - 44 U/L '13  13  20      '$ RADIOGRAPHIC STUDIES: I have personally reviewed the radiological images as listed and agreed with the findings in the report. NM Bone Scan Whole Body  Result Date: 10/11/2022 CLINICAL DATA:  Vertebral lesions at C2 and T1, abnormal MRI cervical spine. History of fall in December 2023. History thyroid cancer, melanoma, atrial fibrillation, chronic diastolic heart failure, hypertension, stroke EXAM: NUCLEAR MEDICINE WHOLE BODY BONE SCAN TECHNIQUE: Whole body anterior and posterior images were obtained approximately 3 hours after intravenous injection of  radiopharmaceutical. RADIOPHARMACEUTICALS:  21.8 mCi Technetium-27mMDP IV COMPARISON:  None available Radiographic correlation: CT 07/08/2022 FINDINGS: Uptake at shoulders, RIGHT sternoclavicular joint, hips, knees, wrists, typically degenerative. Area of increased tracer posteriorly at T3 vertebral body likely reflects shine through from thyroid activity. Uptake in lumbar spine at L4-L5 RIGHT, minimally L3-L4, favor degenerative. Questionable foci of increased tracer uptake at the femoral diaphyses bilaterally, distal RIGHT femur, mid LEFT femur, nonspecific. No additional sites of worrisome tracer uptake are seen. Specifically no abnormal tracer uptake is seen at C2 or T1. Expected urinary tract and soft tissue distribution of tracer. IMPRESSION: Scattered areas of degenerative type uptake as above. Questionable sites of abnormal tracer uptake at the femoral diaphyses bilaterally; these sites could be assessed by follow-up BILATERAL femoral radiographs if clinically indicated based on patient age and comorbidities. Electronically Signed   By: MLavonia DanaM.D.   On: 10/11/2022 09:08    ASSESSMENT & PLAN BSTPEHANIE ANDERTis a 89y.o. [facility] at C2 and T1. We have reviewed etiologies including metastatic disease, myeloma, or benign lesions. Patient will proceed with laboratory workup today   #Spinal lesions C2 and T1 -Labs collected today include CBC, CMP, LDH, ESR, CRP, multiple myeloma panel, kappa/lambda light chains. -Patient will need repeat imaging in x 3 months including MR cervical spine and thoracic which have been ordered -Patient denies needing pain  medication or additional symptom management at this time. - I will call patient to discuss lab results once they are available.Patient will follow up with oncologist after repeat imaging.  Patient and son expressed understanding of the recommended workup and  is agreeable to move forward.   Shared visit with Dr. Burr Medico.  Orders Placed This Encounter  Procedures   CBC with Differential ([place] Only)    Standing Status:   Future    Number of Occurrences:   1    Standing Expiration Date:   10/23/2023   CMP ([place] only)    Standing Status:   Future    Number of Occurrences:   1    Standing Expiration Date:   10/23/2023   Multiple Myeloma Panel (SPEP&IFE w/QIG)    Standing Status:   Future    Number of Occurrences:   1    Standing Expiration Date:   10/23/2023   Kappa/lambda light chains    Standing Status:   Future    Number of Occurrences:   1    Standing Expiration Date:   10/23/2023   Sedimentation rate    Standing Status:   Future    Number of Occurrences:   1    Standing Expiration Date:   10/23/2023   C-reactive protein    Standing Status:   Future    Number of Occurrences:   1    Standing Expiration Date:   10/23/2023   Lactate dehydrogenase (LDH)    Standing Status:   Future    Number of Occurrences:   1    Standing Expiration Date:   10/23/2023    All questions were answered. The patient knows to call the clinic with any problems, questions or concerns.  I have spent a total of 60 minutes minutes of face-to-face and non-face-to-face time, preparing to see the patient, obtaining and/or reviewing separately obtained history, performing a medically appropriate examination, counseling and educating the patient, ordering medications/tests/procedures, referring and communicating with other health care professionals, documenting clinical information in the electronic health record, independently interpreting results and communicating results to the patient, and care coordination.   Sherol Dade PA-C Department of Hematology/Oncology [place] at [facility] Phone: [phone]  Addendum I have seen the patient, examined her. I agree with the assessment and and plan and have edited the notes.    89 year old female, without history of malignancy, who was incidentally found to have 2 bone lesions in C2 and T1 on her cervical spine MRI which was obtained after her stroke.  She has recovered well from stroke.  She is asymptomatic now.  Her previous lab reviewed, no high concerning for multiple myeloma.  She has not had mammogram for many years, but her breast exam is benign today.  I do not have high suspicion for malignancy, will request her outside MRI to be sent to [place], and reviewed in our tumor board.  I recommend lab work today to rule out multiple myeloma, and repeat cervical and thoracic spine MRI in 3 months.  I do not think she needs biopsy at this point.  All questions were answered.  Truitt Merle  10/23/2022

## 2022-10-23 ENCOUNTER — Ambulatory Visit
Admission: RE | Admit: 2022-10-23 | Discharge: 2022-10-23 | Disposition: A | Discharge status: Home / Self Care | Payer: Self-pay | Source: Ambulatory Visit | Attending: Physician Assistant | Admitting: Physician Assistant

## 2022-10-23 ENCOUNTER — Inpatient Hospital Stay: Payer: Medicare Other | Attending: Physician Assistant | Admitting: Physician Assistant

## 2022-10-23 ENCOUNTER — Other Ambulatory Visit: Payer: Self-pay

## 2022-10-23 ENCOUNTER — Inpatient Hospital Stay: Payer: Medicare Other

## 2022-10-23 VITALS — BP 140/69 | HR 76 | Temp 97.8°F | Resp 16 | Wt 116.4 lb

## 2022-10-23 DIAGNOSIS — G959 Disease of spinal cord, unspecified: Secondary | ICD-10-CM

## 2022-10-23 DIAGNOSIS — E039 Hypothyroidism, unspecified: Secondary | ICD-10-CM | POA: Insufficient documentation

## 2022-10-23 DIAGNOSIS — I482 Chronic atrial fibrillation, unspecified: Secondary | ICD-10-CM | POA: Diagnosis not present

## 2022-10-23 DIAGNOSIS — I252 Old myocardial infarction: Secondary | ICD-10-CM | POA: Diagnosis not present

## 2022-10-23 DIAGNOSIS — Z79899 Other long term (current) drug therapy: Secondary | ICD-10-CM | POA: Insufficient documentation

## 2022-10-23 DIAGNOSIS — K219 Gastro-esophageal reflux disease without esophagitis: Secondary | ICD-10-CM | POA: Diagnosis not present

## 2022-10-23 DIAGNOSIS — Z7901 Long term (current) use of anticoagulants: Secondary | ICD-10-CM | POA: Diagnosis not present

## 2022-10-23 DIAGNOSIS — I110 Hypertensive heart disease with heart failure: Secondary | ICD-10-CM | POA: Insufficient documentation

## 2022-10-23 DIAGNOSIS — I5032 Chronic diastolic (congestive) heart failure: Secondary | ICD-10-CM | POA: Diagnosis not present

## 2022-10-23 LAB — C-REACTIVE PROTEIN: CRP: 2 mg/dL — ABNORMAL HIGH (ref <1.0)

## 2022-10-23 LAB — CBC WITH DIFFERENTIAL (CANCER CENTER ONLY)
Abs Immature Granulocytes: 0.01 10*3/uL (ref 0.00–0.07)
Basophils Absolute: 0 10*3/uL (ref 0.0–0.1)
Basophils Relative: 1 %
Eosinophils Absolute: 0.4 10*3/uL (ref 0.0–0.5)
Eosinophils Relative: 5 %
HCT: 42.8 % (ref 36.0–46.0)
Hemoglobin: 14.7 g/dL (ref 12.0–15.0)
Immature Granulocytes: 0 %
Lymphocytes Relative: 28 %
Lymphs Abs: 2.1 10*3/uL (ref 0.7–4.0)
MCH: 32.8 pg (ref 26.0–34.0)
MCHC: 34.3 g/dL (ref 30.0–36.0)
MCV: 95.5 fL (ref 80.0–100.0)
Monocytes Absolute: 0.8 10*3/uL (ref 0.1–1.0)
Monocytes Relative: 11 %
Neutro Abs: 4.3 10*3/uL (ref 1.7–7.7)
Neutrophils Relative %: 55 %
Platelet Count: 186 10*3/uL (ref 150–400)
RBC: 4.48 MIL/uL (ref 3.87–5.11)
RDW: 13.1 % (ref 11.5–15.5)
WBC Count: 7.6 10*3/uL (ref 4.0–10.5)
nRBC: 0 % (ref 0.0–0.2)

## 2022-10-23 LAB — CMP (CANCER CENTER ONLY)
ALT: 13 U/L (ref 0–44)
AST: 23 U/L (ref 15–41)
Albumin: 4 g/dL (ref 3.5–5.0)
Alkaline Phosphatase: 79 U/L (ref 38–126)
Anion gap: 8 (ref 5–15)
BUN: 25 mg/dL — ABNORMAL HIGH (ref 8–23)
CO2: 30 mmol/L (ref 22–32)
Calcium: 8.5 mg/dL — ABNORMAL LOW (ref 8.9–10.3)
Chloride: 98 mmol/L (ref 98–111)
Creatinine: 0.78 mg/dL (ref 0.44–1.00)
GFR, Estimated: >60 mL/min (ref >60)
Glucose, Bld: 97 mg/dL (ref 70–99)
Potassium: 3.7 mmol/L (ref 3.5–5.1)
Sodium: 136 mmol/L (ref 135–145)
Total Bilirubin: 0.6 mg/dL (ref 0.3–1.2)
Total Protein: 7.5 g/dL (ref 6.5–8.1)

## 2022-10-23 LAB — LACTATE DEHYDROGENASE: LDH: 227 U/L — ABNORMAL HIGH (ref 98–192)

## 2022-10-23 LAB — SEDIMENTATION RATE: Sed Rate: 38 mm/hr — ABNORMAL HIGH (ref 0–22)

## 2022-10-24 LAB — KAPPA/LAMBDA LIGHT CHAINS
Kappa free light chain: 34.4 mg/L — ABNORMAL HIGH (ref 3.3–19.4)
Kappa, lambda light chain ratio: 1.21 (ref 0.26–1.65)
Lambda free light chains: 28.5 mg/L — ABNORMAL HIGH (ref 5.7–26.3)

## 2022-10-24 NOTE — Progress Notes (Signed)
Faxed request to [facility] to have the following images pushed to [facility]): MRI cervical spine completed on 09/12/2022.

## 2022-10-28 LAB — MULTIPLE MYELOMA PANEL, SERUM
Albumin SerPl Elph-Mcnc: 3.6 g/dL (ref 2.9–4.4)
Albumin/Glob SerPl: 1.1 (ref 0.7–1.7)
Alpha 1: 0.3 g/dL (ref 0.0–0.4)
Alpha2 Glob SerPl Elph-Mcnc: 0.8 g/dL (ref 0.4–1.0)
B-Globulin SerPl Elph-Mcnc: 0.9 g/dL (ref 0.7–1.3)
Gamma Glob SerPl Elph-Mcnc: 1.3 g/dL (ref 0.4–1.8)
Globulin, Total: 3.3 g/dL (ref 2.2–3.9)
IgA: 236 mg/dL (ref 64–422)
IgG (Immunoglobin G), Serum: 1322 mg/dL (ref 586–1602)
IgM (Immunoglobulin M), Srm: 54 mg/dL (ref 26–217)
Total Protein ELP: 6.9 g/dL (ref 6.0–8.5)

## 2022-10-30 ENCOUNTER — Other Ambulatory Visit: Payer: Self-pay

## 2022-10-30 NOTE — Progress Notes (Signed)
The proposed treatment discussed in conference is for discussion purpose only and is not a binding recommendation.  The patients have not been physically examined, or presented with their treatment options.  Therefore, final treatment plans cannot be decided.  

## 2022-10-31 DIAGNOSIS — R2681 Unsteadiness on feet: Secondary | ICD-10-CM | POA: Diagnosis not present

## 2022-10-31 DIAGNOSIS — M6281 Muscle weakness (generalized): Secondary | ICD-10-CM | POA: Diagnosis not present

## 2022-10-31 DIAGNOSIS — I69398 Other sequelae of cerebral infarction: Secondary | ICD-10-CM | POA: Diagnosis not present

## 2022-10-31 DIAGNOSIS — R2689 Other abnormalities of gait and mobility: Secondary | ICD-10-CM | POA: Diagnosis not present

## 2022-11-07 ENCOUNTER — Telehealth: Payer: Self-pay | Admitting: Physician Assistant

## 2022-11-07 ENCOUNTER — Telehealth (INDEPENDENT_AMBULATORY_CARE_PROVIDER_SITE_OTHER): Payer: Medicare Other | Admitting: Orthopedic Surgery

## 2022-11-07 ENCOUNTER — Encounter: Payer: Self-pay | Admitting: Orthopedic Surgery

## 2022-11-07 ENCOUNTER — Telehealth: Payer: Medicare Other | Admitting: Orthopedic Surgery

## 2022-11-07 DIAGNOSIS — G959 Disease of spinal cord, unspecified: Secondary | ICD-10-CM

## 2022-11-07 DIAGNOSIS — E890 Postprocedural hypothyroidism: Secondary | ICD-10-CM

## 2022-11-07 DIAGNOSIS — R232 Flushing: Secondary | ICD-10-CM

## 2022-11-07 NOTE — Telephone Encounter (Signed)
I called patient to discuss next steps of her diagnostic workup and her lab results.  Patient's son had been contacted by Dr. Burr Medico who recommended PET scan after patient had been presented at tumor board and her outpatient imaging MRI cervical spine had been reviewed.  At this time there is suspicion for malignancy, possibly breast.  Patient and granddaughter are agreeable to proceed with PET scan.  Patient will follow-up with Dr. Burr Medico after PET scan to discuss results. Patient is unsure she would want to proceed with chemotherapy at this time, however is eager to know if she has cancer and what needs to be done. She has no additional needs at this time.

## 2022-11-07 NOTE — Progress Notes (Signed)
   This service is provided via telemedicine  No vital signs collected/recorded due to the encounter was a telemedicine visit.   Location of patient (ex: home, work):  Home  Patient consents to a telephone visit:  Yes  Location of the provider (ex: office, home):  [employer].  Name of any referring provider:  [name], NP   Names of all persons participating in the telemedicine service and their role in the encounter:  Patient, Kristen Whitehead, Kristen Whitehead, Kristen Whitehead, Kristen Moulding, NP.    Time spent on call:  8 minutes spent on the phone with Medical Assistant.

## 2022-11-07 NOTE — Progress Notes (Signed)
Careteam: Patient Care Team: Kristen Alanis, NP as PCP - General (Adult Health Nurse Practitioner) O'Neal, Cassie Freer, MD as PCP - Cardiology (Cardiology) Rex Kras, Claudette Stapler, RN as [place] [facility] [place], Tillie Rung as [place], Cassie Freer, MD as Consulting Physician (Cardiology)  Seen by: [name], AGNP-C  PLACE OF SERVICE:  [place] Directive information Does Patient Have a Medical Advance Directive?: Yes, Type of Advance Directive: Living will, Does patient want to make changes to medical advance directive?: No - Patient declined  Allergies  Allergen Reactions   Azithromycin Nausea Only and Other (See Comments)   Compazine [Prochlorperazine Edisylate] Nausea Only   Demerol Nausea Only   Dolophine [Methadone] Nausea Only   Ebastine Nausea Only    EBS   Erythromycin Nausea Only and Other (See Comments)   Gluten Meal Diarrhea   Ibuprofen Nausea Only and Other (See Comments)   Meperidine Hcl Other (See Comments)   Morphine Sulfate Other (See Comments)   Statins Other (See Comments)    myalgias   Tetanus Toxoids Nausea Only   Tetracycline Other (See Comments)   Tetracyclines & Related Nausea Only    Chief Complaint  Patient presents with   Acute Visit    Wants to discuss Thyroid medication.     HPI: Patient is a 89 y.o. female seen today for acute visit via video visit to discuss thyroid concerns.   02/29 TSH 0.24. Levothyroxine was reduced to 50 mcg daily. She started new dose about 3 weeks ago. Since then, she has had increased hot and cold flashes at night. Hot/cold flashes have made it harder for her to sleep. She is averaging about 4-5 hours a night. She is taking temazepam for insomnia.   Review of Systems:  Review of Systems  Constitutional:  Positive for malaise/fatigue.       Hot/cold flashes  Respiratory:  Negative for cough, shortness of breath and wheezing.   Cardiovascular:   Negative for chest pain and leg swelling.  Psychiatric/Behavioral:  Negative for depression. The patient is not nervous/anxious.     Past Medical History:  Diagnosis Date   Bladder prolapse, female, acquired    Cancer [facility])    thyroid   Chest pain    no known ischemic heart disease; negative Myoview July 2013. EF 75% with no ischemia.    Chronic anticoagulation    Chronic atrial fibrillation (HCC)    managed with rate control and coumadin   Chronic diastolic heart failure (HCC)    GERD (gastroesophageal reflux disease)    Heart disease    History of congenital mitral regurgitation    mild   History of heart attack    Multiple    History of mitral valve prolapse 06/15/2008   a. echo 1/14: mild LVH, EF 60-65%, mild MR, mild to mod BAE, PASP 35   Hypercholesterolemia    Hypertension    Hypothyroidism    Stroke [facility])    Past Surgical History:  Procedure Laterality Date   ABDOMINAL HYSTERECTOMY     CATARACT EXTRACTION     CHOLECYSTECTOMY     Thyroidectomy     TONSILLECTOMY     Social History:   reports that she has never smoked. She has never used smokeless tobacco. She reports that she does not drink alcohol and does not use drugs.  Family History  Problem Relation Age of Onset   Stroke Mother 88   Stroke Father 57  Arthritis Daughter    Gout Daughter    Immunocompromised Granddaughter    Heart attack Neg Hx    Heart disease Neg Hx     Medications: Patient's Medications  New Prescriptions   No medications on file  Previous Medications   ACETAMINOPHEN (TYLENOL) 500 MG TABLET    Take 2 tablets (1,000 mg total) by mouth every 8 (eight) hours as needed.   AMLODIPINE (NORVASC) 2.5 MG TABLET    TAKE 2 TABLETS BY MOUTH  DAILY   APIXABAN (ELIQUIS) 2.5 MG TABS TABLET    Take 1 tablet (2.5 mg total) by mouth 2 (two) times daily.   ATENOLOL (TENORMIN) 25 MG TABLET    Take 1 tablet (25 mg total) by mouth 2 (two) times daily.   DORZOLAMIDE (TRUSOPT) 2 % OPHTHALMIC SOLUTION     Place 1 drop into the left eye 2 (two) times daily.   EZETIMIBE (ZETIA) 10 MG TABLET    Take 1 tablet (10 mg total) by mouth daily.   FUROSEMIDE (LASIX) 40 MG TABLET    Take 1 tablet (40 mg total) by mouth daily.   LATANOPROST (XALATAN) 0.005 % OPHTHALMIC SOLUTION    Place 1 drop into both eyes at bedtime.    LEVOTHYROXINE (SYNTHROID) 50 MCG TABLET    Take 1 tablet (50 mcg total) by mouth daily.   MULTIPLE VITAMIN (MULTIVITAMIN WITH MINERALS) TABS TABLET    Take 1 tablet by mouth daily.   NITROGLYCERIN (NITROSTAT) 0.4 MG SL TABLET    DISSOLVE 1 TABLET UNDER THE TONGUE EVERY 5 MINUTES FOR 3 DOSES AS NEEDED  FOR  CHEST  PAIN   OMEPRAZOLE (PRILOSEC) 20 MG CAPSULE    Take 20 mg by mouth daily as needed (acid reflux).   PRAMIPEXOLE (MIRAPEX) 0.125 MG TABLET    TAKE 1 TABLET BY MOUTH AT  BEDTIME   PREMARIN VAGINAL CREAM    Place 2 g vaginally 2 (two) times a week. Uses on Sunday and wednesday   PROBIOTIC PRODUCT (PROBIOTIC DAILY PO)    Take 1 tablet by mouth daily.   SODIUM CHLORIDE (MURO 128) 5 % OPHTHALMIC SOLUTION    Place 1 drop into the right eye at bedtime.    TEMAZEPAM (RESTORIL) 7.5 MG CAPSULE    Take 1 capsule (7.5 mg total) by mouth at bedtime as needed for sleep.  Modified Medications   No medications on file  Discontinued Medications   No medications on file    Physical Exam:  There were no vitals filed for this visit. There is no height or weight on file to calculate BMI. Wt Readings from Last 3 Encounters:  10/23/22 116 lb 6.4 oz (52.8 kg)  10/17/22 118 lb 12.8 oz (53.9 kg)  09/20/22 121 lb 3.2 oz (55 kg)    Physical Exam Vitals reviewed: unable to complete due to virtual visit.  Constitutional:      General: She is not in acute distress. Neurological:     Mental Status: She is alert.     Labs reviewed: Basic Metabolic Panel: Recent Labs    05/20/22 1120 06/24/22 1624 07/08/22 1454 07/08/22 1738 07/08/22 2225 08/15/22 1139 10/17/22 1613 10/23/22 1233  NA   --    < >  --  139   < > 140 141 136  K  --    < >  --  3.5   < > 3.7 3.7 3.7  CL  --    < >  --  106   < >  101 99 98  CO2  --    < >  --  27   < > 30 35* 30  GLUCOSE  --    < >  --  125*   < > 89 92 97  BUN  --    < >  --  9   < > 12 11 25*  CREATININE  --    < >  --  0.65   < > 0.75 0.66 0.78  CALCIUM  --    < >  --  7.6*   < > 8.1* 8.7 8.5*  MG  --   --   --  1.8  --   --   --   --   TSH 17.58*  --  19.195*  --   --   --  0.24*  --    < > = values in this interval not displayed.   Liver Function Tests: Recent Labs    08/15/22 1139 10/17/22 1613 10/23/22 1233  AST 25 19 23   ALT 20 13 13   ALKPHOS  --   --  79  BILITOT 0.6 0.5 0.6  PROT 7.0 6.5 7.5  ALBUMIN  --   --  4.0   No results for input(s): "LIPASE", "AMYLASE" in the last 8760 hours. No results for input(s): "AMMONIA" in the last 8760 hours. CBC: Recent Labs    08/15/22 1139 10/17/22 1613 10/23/22 1233  WBC 5.2 6.6 7.6  NEUTROABS 2,642 3,168 4.3  HGB 14.2 14.1 14.7  HCT 40.5 42.4 42.8  MCV 96.4 95.5 95.5  PLT 225 262 186   Lipid Panel: Recent Labs    07/09/22 0332  CHOL 207*  HDL 82  LDLCALC 108*  TRIG 84  CHOLHDL 2.5   TSH: Recent Labs    05/20/22 1120 07/08/22 1454 10/17/22 1613  TSH 17.58* 19.195* 0.24*   A1C: Lab Results  Component Value Date   HGBA1C 5.8 (H) 07/09/2022     Assessment/Plan 1. Postoperative hypothyroidism - TSH 0.24 10/17/2022 - levothyroxine reduced to 50 mcg - she started new medication> now having hot/cold flashes - weather changes versus thyroid involvement - cont levothyroxine - recheck TSH next routine  2. Hot flashes - see above - advised to wear layered clothing  Total time: 11 minutes. Greater than 50% of total time spent doing patient education regarding thyroid function/symptoms including symptom/medication management.    Virtual Visit   I connected with Kristen Whitehead by telephone and verified that I am speaking with the correct person using  two identifiers.  Location: [place] Patient: Kristen Whitehead Provider: [name], NP    I discussed the limitations, risks, security and privacy concerns of performing an evaluation and management service by telephone and the availability of in person appointments. I also discussed with the patient that there may be a patient responsible charge related to this service. The patient expressed understanding and agreed to proceed.   I discussed the assessment and treatment plan with the patient. The patient was provided an opportunity to ask questions and all were answered. The patient agreed with the plan and demonstrated an understanding of the instructions.   The patient was advised to call back or seek an in-person evaluation if the symptoms worsen or if the condition fails to improve as anticipated.  I provided 11 minutes of non-face-to-face time during this encounter.  Sheketa Ende Cleophas Dunker, AGNP Avs printed and mailed   Next appt: Visit date not found  [place], [facility] [phone]

## 2022-11-10 NOTE — Progress Notes (Unsigned)
Cardiology Office Note:   Date:  11/12/2022  NAME:  Kristen Whitehead    MRN: [id number] DOB:  [birth year]   PCP:  [name], NP  Cardiologist:  Evalina Field, MD  Electrophysiologist:  None   Referring MD: [name], NP   Chief Complaint  Patient presents with   Follow-up   History of Present Illness:   Kristen Whitehead is a 89 y.o. female with a hx of permanent Afib, HTN, HFpEF who presents for follow-up.  Admitted for stroke in November.  Now on Eliquis.  No bleeding issues.  She has spinal lesions concerning for metastatic disease.  She reports she would not like chemotherapy.  Unclear if she will have further workup of this given her age.  She will be 89 in 4 days.  She denies any chest pain or trouble breathing.  Blood pressure is well-controlled.  She reports she is working on a low-salt diet.  Her granddaughter is doing this.  Also watching her water intake.  Only taking Lasix once daily.  Overall doing well.  Denies any deficits from her stroke.  Recovering well.  Problem List Permament Afib -CHADSVASC=6 (age, female, HTN, CVA) 2. HTN 3. HFpEF  4. CVA -06/2022  Past Medical History: Past Medical History:  Diagnosis Date   Bladder prolapse, female, acquired    Cancer [facility])    thyroid   Chest pain    no known ischemic heart disease; negative Myoview July 2013. EF 75% with no ischemia.    Chronic anticoagulation    Chronic atrial fibrillation (HCC)    managed with rate control and coumadin   Chronic diastolic heart failure (HCC)    GERD (gastroesophageal reflux disease)    Heart disease    History of congenital mitral regurgitation    mild   History of heart attack    Multiple    History of mitral valve prolapse 06/15/2008   a. echo 1/14: mild LVH, EF 60-65%, mild MR, mild to mod BAE, PASP 35   Hypercholesterolemia    Hypertension    Hypothyroidism    Stroke [facility])     Past Surgical History: Past Surgical History:  Procedure Laterality Date    ABDOMINAL HYSTERECTOMY     CATARACT EXTRACTION     CHOLECYSTECTOMY     Thyroidectomy     TONSILLECTOMY      Current Medications: Current Meds  Medication Sig   acetaminophen (TYLENOL) 500 MG tablet Take 2 tablets (1,000 mg total) by mouth every 8 (eight) hours as needed.   amLODipine (NORVASC) 2.5 MG tablet TAKE 2 TABLETS BY MOUTH  DAILY   apixaban (ELIQUIS) 2.5 MG TABS tablet Take 1 tablet (2.5 mg total) by mouth 2 (two) times daily.   atenolol (TENORMIN) 25 MG tablet Take 1 tablet (25 mg total) by mouth 2 (two) times daily.   dorzolamide (TRUSOPT) 2 % ophthalmic solution Place 1 drop into the left eye 2 (two) times daily.   ezetimibe (ZETIA) 10 MG tablet Take 1 tablet (10 mg total) by mouth daily.   furosemide (LASIX) 40 MG tablet Take 1 tablet (40 mg total) by mouth daily.   latanoprost (XALATAN) 0.005 % ophthalmic solution Place 1 drop into both eyes at bedtime.    levothyroxine (SYNTHROID) 50 MCG tablet Take 1 tablet (50 mcg total) by mouth daily.   Multiple Vitamin (MULTIVITAMIN WITH MINERALS) TABS tablet Take 1 tablet by mouth daily.   nitroGLYCERIN (NITROSTAT) 0.4 MG SL tablet DISSOLVE 1 TABLET  UNDER THE TONGUE EVERY 5 MINUTES FOR 3 DOSES AS NEEDED  FOR  CHEST  PAIN   omeprazole (PRILOSEC) 20 MG capsule Take 20 mg by mouth daily as needed (acid reflux).   pramipexole (MIRAPEX) 0.125 MG tablet TAKE 1 TABLET BY MOUTH AT  BEDTIME   PREMARIN vaginal cream Place 2 g vaginally 2 (two) times a week. Uses on Sunday and wednesday   sodium chloride (MURO 128) 5 % ophthalmic solution Place 1 drop into the right eye at bedtime.    temazepam (RESTORIL) 7.5 MG capsule Take 1 capsule (7.5 mg total) by mouth at bedtime as needed for sleep.     Allergies:    Azithromycin, Compazine [prochlorperazine edisylate], Demerol, Dolophine [methadone], Ebastine, Erythromycin, Gluten meal, Ibuprofen, Meperidine hcl, Morphine sulfate, Statins, Tetanus toxoids, Tetracycline, and Tetracyclines & related    Social History: Social History   Socioeconomic History   Marital status: Widowed    Spouse name: Not on file   Number of children: 3   Years of education: BA   Highest education level: Not on file  Occupational History    Employer: RETIRED   Occupation: Retired [employer]  Tobacco Use   Smoking status: Never   Smokeless tobacco: Never  Vaping Use   Vaping Use: Never used  Substance and Sexual Activity   Alcohol use: No   Drug use: No   Sexual activity: Not Currently  Other Topics Concern   Not on file  Social History Narrative   Pt lives at home alone.   Caffeine Use: quit 55yrs ago      Diet: No Maceo Pro      Do you drink/ eat things with caffeineNo      Marital status:   Widowed                            What year were you married ? 1947      Do you live in a house, apartment,assistred living, condo, trailer, etc.)? [place]      Is it one or more stories? 1      How many persons live in your home ? Me      Do you have any pets in your home ?(please list) No      Highest Level of education completed:  BA [employer]      Current or past profession:  Pharmacist, hospital, Office, [employer]      Do you exercise?  No                            Type & how often       ADVANCED DIRECTIVES (Please bring copies)      Do you have a living will? Yes      Do you have a DNR form?   Yes                    If not, do you want to discuss one?       Do you have signed POA?HPOA forms?  Yes               If so, please bring to your appointment      FUNCTIONAL STATUS- To be completed by Spouse / child / Staff       Do you have difficulty bathing or dressing yourself ?  No  Do you have difficulty preparing food or eating ?  No      Do you have difficulty managing your mediation ?  No      Do you have difficulty managing your finances ?  No      Do you have difficulty affording your medication ?  No      Social Determinants of Health   Financial Resource  Strain: Low Risk  (05/06/2022)   Overall Financial Resource Strain (CARDIA)    Difficulty of Paying Living Expenses: Not hard at all  Food Insecurity: No Food Insecurity (05/06/2022)   Hunger Vital Sign    Worried About Running Out of Food in the Last Year: Never true    Ran Out of Food in the Last Year: Never true  Transportation Needs: No Transportation Needs (05/06/2022)   PRAPARE - Hydrologist (Medical): No    Lack of Transportation (Non-Medical): No  Physical Activity: Insufficiently Active (05/06/2022)   Exercise Vital Sign    Days of Exercise per Week: 2 days    Minutes of Exercise per Session: 10 min  Stress: No Stress Concern Present (05/06/2022)   Balm    Feeling of Stress : Not at all  Social Connections: Moderately Integrated (05/06/2022)   Social Connection and Isolation Panel [NHANES]    Frequency of Communication with Friends and Family: Three times a week    Frequency of Social Gatherings with Friends and Family: Three times a week    Attends Religious Services: 1 to 4 times per year    Active Member of Clubs or Organizations: Yes    Attends Archivist Meetings: More than 4 times per year    Marital Status: Widowed     Family History: The patient's family history includes Arthritis in her daughter; Gout in her daughter; Immunocompromised in her granddaughter; Stroke (age of onset: 69) in her mother; Stroke (age of onset: 56) in her father. There is no history of Heart attack or Heart disease.  ROS:   All other ROS reviewed and negative. Pertinent positives noted in the HPI.     EKGs/Labs/Other Studies Reviewed:   The following studies were personally reviewed by me today:  Recent Labs: 07/08/2022: B Natriuretic Peptide 359.8; Magnesium 1.8 10/17/2022: TSH 0.24 10/23/2022: ALT 13; BUN 25; Creatinine 0.78; Hemoglobin 14.7; Platelet Count 186; Potassium 3.7;  Sodium 136   Recent Lipid Panel    Component Value Date/Time   CHOL 207 (H) 07/09/2022 0332   TRIG 84 07/09/2022 0332   HDL 82 07/09/2022 0332   CHOLHDL 2.5 07/09/2022 0332   VLDL 17 07/09/2022 0332   LDLCALC 108 (H) 07/09/2022 0332   LDLDIRECT 98.8 11/04/2012 1524    Physical Exam:   VS:  BP 116/62 (BP Location: Left Arm, Patient Position: Sitting, Cuff Size: Normal)   Pulse 62   Ht 4\' 11"  (1.499 m)   Wt 116 lb 6.4 oz (52.8 kg)   SpO2 98%   BMI 23.51 kg/m    Wt Readings from Last 3 Encounters:  11/12/22 116 lb 6.4 oz (52.8 kg)  10/23/22 116 lb 6.4 oz (52.8 kg)  10/17/22 118 lb 12.8 oz (53.9 kg)    General: Well nourished, well developed, in no acute distress Head: Atraumatic, normal size  Eyes: PEERLA, EOMI  Neck: Supple, no JVD Endocrine: No thryomegaly Cardiac: Normal S1, S2; irregular rhythm, no murmurs Lungs: Clear to auscultation bilaterally, no wheezing,  rhonchi or rales  Abd: Soft, nontender, no hepatomegaly  Ext: No edema, pulses 2+ Musculoskeletal: No deformities, BUE and BLE strength normal and equal Skin: Warm and dry, no rashes   Neuro: Alert and oriented to person, place, time, and situation, CNII-XII grossly intact, no focal deficits  Psych: Normal mood and affect   ASSESSMENT:   Kristen Whitehead is a 89 y.o. female who presents for the following: 1. Permanent atrial fibrillation ([place])   2. Chronic diastolic heart failure ([place])   3. Essential hypertension     PLAN:   1. Permanent atrial fibrillation (HCC) -Permanent A-fib.  Rate controlled on atenolol 25 mg twice daily.  Currently on Eliquis 2.5 mg twice daily.  Appropriate dose.  Has had a stroke.  Would recommend anticoagulation.  No significant falls or bruising or bleeding. -We discussed that she is stable.  She can see [place] back as needed given her age.  She is not wanting aggressive medical care.  I do agree with this.  Her primary care physician can reach out to [place] if needed.  2. Chronic  diastolic heart failure ([place]) 3. Essential hypertension -Euvolemic on examination.  On Lasix 40 mg daily.  Seems to be well-controlled.  Also watching her salt intake.  Blood pressure is at goal.  Denies any symptoms.  Disposition: Return in about 6 months (around 05/15/2023).  Medication Adjustments/Labs and Tests Ordered: Current medicines are reviewed at length with the patient today.  Concerns regarding medicines are outlined above.  No orders of the defined types were placed in this encounter.  No orders of the defined types were placed in this encounter.   Patient Instructions  Medication Instructions:  The current medical regimen is effective;  continue present plan and medications.  *If you need a refill on your cardiac medications before your next appointment, please call your pharmacy*   Follow-Up: At [facility], you and your health needs are our priority.  As part of our continuing mission to provide you with exceptional heart care, we have created designated Provider Care Teams.  These Care Teams include your primary Cardiologist (physician) and Advanced Practice Providers (APPs -  Physician Assistants and Nurse Practitioners) who all work together to provide you with the care you need, when you need it.  We recommend signing up for the patient portal called "MyChart".  Sign up information is provided on this After Visit Summary.  MyChart is used to connect with patients for Virtual Visits (Telemedicine).  Patients are able to view lab/test results, encounter notes, upcoming appointments, etc.  Non-urgent messages can be sent to your provider as well.   To learn more about what you can do with MyChart, go to [email or web address].    Your next appointment:   As needed  Provider:   Evalina Field, MD      Time Spent with Patient: I have spent a total of 25 minutes with patient reviewing hospital notes, telemetry, EKGs, labs and examining the patient as well as  establishing an assessment and plan that was discussed with the patient.  > 50% of time was spent in direct patient care.  Signed, Addison Naegeli. Audie Box, MD, [place]  [address] [place], [place] [address] [phone]  11/12/2022 3:22 PM

## 2022-11-12 ENCOUNTER — Ambulatory Visit: Payer: Medicare Other | Attending: Cardiovascular Disease | Admitting: Cardiovascular Disease

## 2022-11-12 ENCOUNTER — Encounter: Payer: Self-pay | Admitting: Cardiovascular Disease

## 2022-11-12 VITALS — BP 116/62 | HR 62 | Ht 59.0 in | Wt 116.4 lb

## 2022-11-12 DIAGNOSIS — I10 Essential (primary) hypertension: Secondary | ICD-10-CM

## 2022-11-12 DIAGNOSIS — I5032 Chronic diastolic (congestive) heart failure: Secondary | ICD-10-CM

## 2022-11-12 DIAGNOSIS — I4821 Permanent atrial fibrillation: Secondary | ICD-10-CM | POA: Diagnosis not present

## 2022-11-12 NOTE — Patient Instructions (Signed)
Medication Instructions:  The current medical regimen is effective;  continue present plan and medications.  *If you need a refill on your cardiac medications before your next appointment, please call your pharmacy*   Follow-Up: At [facility], you and your health needs are our priority.  As part of our continuing mission to provide you with exceptional heart care, we have created designated Provider Care Teams.  These Care Teams include your primary Cardiologist (physician) and Advanced Practice Providers (APPs -  Physician Assistants and Nurse Practitioners) who all work together to provide you with the care you need, when you need it.  We recommend signing up for the patient portal called "MyChart".  Sign up information is provided on this After Visit Summary.  MyChart is used to connect with patients for Virtual Visits (Telemedicine).  Patients are able to view lab/test results, encounter notes, upcoming appointments, etc.  Non-urgent messages can be sent to your provider as well.   To learn more about what you can do with MyChart, go to [email or web address].    Your next appointment:   As needed  Provider:   [place] T O'Neal, MD      

## 2022-11-13 ENCOUNTER — Encounter
Admission: RE | Admit: 2022-11-13 | Discharge: 2022-11-13 | Disposition: A | Discharge status: Home / Self Care | Payer: Medicare Other | Source: Ambulatory Visit | Attending: Physician Assistant | Admitting: Physician Assistant

## 2022-11-13 DIAGNOSIS — I700 Atherosclerosis of aorta: Secondary | ICD-10-CM | POA: Diagnosis not present

## 2022-11-13 DIAGNOSIS — M5031 Other cervical disc degeneration,  high cervical region: Secondary | ICD-10-CM | POA: Insufficient documentation

## 2022-11-13 DIAGNOSIS — Z85820 Personal history of malignant melanoma of skin: Secondary | ICD-10-CM | POA: Diagnosis not present

## 2022-11-13 DIAGNOSIS — R911 Solitary pulmonary nodule: Secondary | ICD-10-CM | POA: Diagnosis not present

## 2022-11-13 DIAGNOSIS — G959 Disease of spinal cord, unspecified: Secondary | ICD-10-CM | POA: Diagnosis not present

## 2022-11-13 DIAGNOSIS — E279 Disorder of adrenal gland, unspecified: Secondary | ICD-10-CM | POA: Diagnosis not present

## 2022-11-13 LAB — GLUCOSE, CAPILLARY: Glucose-Capillary: 118 mg/dL — ABNORMAL HIGH (ref 70–99)

## 2022-11-13 MED ORDER — FLUDEOXYGLUCOSE F - 18 (FDG) INJECTION
6.0000 | Freq: Once | INTRAVENOUS | Status: AC
  Administered 2022-11-13: 6.34 via INTRAVENOUS

## 2022-11-18 DIAGNOSIS — H353211 Exudative age-related macular degeneration, right eye, with active choroidal neovascularization: Secondary | ICD-10-CM | POA: Diagnosis not present

## 2022-11-19 DIAGNOSIS — H1820 Unspecified corneal edema: Secondary | ICD-10-CM | POA: Diagnosis not present

## 2022-11-19 DIAGNOSIS — H401131 Primary open-angle glaucoma, bilateral, mild stage: Secondary | ICD-10-CM | POA: Diagnosis not present

## 2022-11-21 ENCOUNTER — Inpatient Hospital Stay: Payer: Medicare Other | Attending: Physician Assistant | Admitting: Hematology

## 2022-11-21 DIAGNOSIS — R937 Abnormal findings on diagnostic imaging of other parts of musculoskeletal system: Secondary | ICD-10-CM | POA: Diagnosis not present

## 2022-11-21 NOTE — Assessment & Plan Note (Signed)
-  she was incidentally found to have 2 bone lesions in C2 and T1 on her cervical spine MRI in 08/2022 which was obtained after her stroke. She had no history of malignancy and is asymptomatic from her bone lesions  -MM lab was negative -I reviewed her PET scan images and discussed the findings with her, there is no hypermetabolic bone lesions or other suspicion for malignancy on PET  -Given her advanced age, low suspicion for malignancy, I do not recommend bone biopsy -Will follow-up clinically, I will see her as needed.

## 2022-11-21 NOTE — Progress Notes (Signed)
[place]   [phone] [phone]   Clinic Follow up Note   Patient Care Team: [name], NP as PCP - General (Adult Health Nurse Practitioner) O'Neal, Cassie Freer, MD as PCP - Cardiology (Cardiology) Rex Kras Claudette Stapler, RN as [place] Daneen Schick as [place], Cassie Freer, MD as Consulting Physician (Cardiology)  Date of Service:  11/21/2022  I connected with Kristen Whitehead on 11/21/2022 at  1:00 PM EDT by  and verified that I am speaking with the correct person using two identifiers.  I discussed the limitations, risks, security and privacy concerns of performing an evaluation and management service by telephone and the availability of in person appointments. I also discussed with the patient that there may be a patient responsible charge related to this service. The patient expressed understanding and agreed to proceed.   Other persons participating in the visit and their role in the encounter:  Granddaughter  Patient's location:  Home Provider's location:  Office  CHIEF COMPLAINT: f/u of abnormal MRI scan,bone   ASSESSMENT & PLAN:  Kristen Whitehead is a 89 y.o. female with    Abnormal MRI scan, bone lesions  -she was incidentally found to have 2 bone lesions in C2 and T1 on her cervical spine MRI in 08/2022 which was obtained after her stroke. She had no history of malignancy and is asymptomatic from her bone lesions  -MM lab was negative -I reviewed her PET scan images and discussed the findings with her, there is no hypermetabolic bone lesions or other suspicion for malignancy on PET.  I discussed the findings with patient, she is quite relieved. -Given her advanced age, very low suspicion for malignancy, I do not recommend bone biopsy -Will follow-up clinically, I will see her as needed.  PLAN: -Discuss PET scan-no suspicion for malignancy -Discuss lab  from previous visit, unremarkable - No need to f/u , will se her as needed.   INTERVAL HISTORY:  Kristen Whitehead was contacted for a follow up of abnormal MRI scan,bone . She was last seen by [facility] on 11/07/2022. Pt state that she has been doing fine. Pt denies having any pain in the neck or upper back.   All other systems were reviewed with the patient and are negative.  MEDICAL HISTORY:  Past Medical History:  Diagnosis Date   Bladder prolapse, female, acquired    Cancer [facility])    thyroid   Chest pain    no known ischemic heart disease; negative Myoview July 2013. EF 75% with no ischemia.    Chronic anticoagulation    Chronic atrial fibrillation (HCC)    managed with rate control and coumadin   Chronic diastolic heart failure (HCC)    GERD (gastroesophageal reflux disease)    Heart disease    History of congenital mitral regurgitation    mild   History of heart attack    Multiple    History of mitral valve prolapse 06/15/2008   a. echo 1/14: mild LVH, EF 60-65%, mild MR, mild to mod BAE, PASP 35   Hypercholesterolemia    Hypertension    Hypothyroidism    Stroke [facility])     SURGICAL HISTORY: Past Surgical History:  Procedure Laterality Date   ABDOMINAL HYSTERECTOMY     CATARACT EXTRACTION     CHOLECYSTECTOMY     Thyroidectomy     TONSILLECTOMY      I have reviewed the social  history and family history with the patient and they are unchanged from previous note.  ALLERGIES:  is allergic to azithromycin, compazine [prochlorperazine edisylate], demerol, dolophine [methadone], ebastine, erythromycin, gluten meal, ibuprofen, meperidine hcl, morphine sulfate, statins, tetanus toxoids, tetracycline, and tetracyclines & related.  MEDICATIONS:  Current Outpatient Medications  Medication Sig Dispense Refill   acetaminophen (TYLENOL) 500 MG tablet Take 2 tablets (1,000 mg total) by mouth every 8 (eight) hours as needed. 30 tablet 0   amLODipine (NORVASC) 2.5 MG tablet  TAKE 2 TABLETS BY MOUTH  DAILY 180 tablet 3   apixaban (ELIQUIS) 2.5 MG TABS tablet Take 1 tablet (2.5 mg total) by mouth 2 (two) times daily. 180 tablet 1   atenolol (TENORMIN) 25 MG tablet Take 1 tablet (25 mg total) by mouth 2 (two) times daily. 180 tablet 3   dorzolamide (TRUSOPT) 2 % ophthalmic solution Place 1 drop into the left eye 2 (two) times daily.     ezetimibe (ZETIA) 10 MG tablet Take 1 tablet (10 mg total) by mouth daily. 30 tablet 10   furosemide (LASIX) 40 MG tablet Take 1 tablet (40 mg total) by mouth daily. 90 tablet 3   latanoprost (XALATAN) 0.005 % ophthalmic solution Place 1 drop into both eyes at bedtime.      levothyroxine (SYNTHROID) 50 MCG tablet Take 1 tablet (50 mcg total) by mouth daily. 90 tablet 3   Multiple Vitamin (MULTIVITAMIN WITH MINERALS) TABS tablet Take 1 tablet by mouth daily. 30 tablet 0   nitroGLYCERIN (NITROSTAT) 0.4 MG SL tablet DISSOLVE 1 TABLET UNDER THE TONGUE EVERY 5 MINUTES FOR 3 DOSES AS NEEDED  FOR  CHEST  PAIN 50 tablet 2   omeprazole (PRILOSEC) 20 MG capsule Take 20 mg by mouth daily as needed (acid reflux).     pramipexole (MIRAPEX) 0.125 MG tablet TAKE 1 TABLET BY MOUTH AT  BEDTIME 90 tablet 3   PREMARIN vaginal cream Place 2 g vaginally 2 (two) times a week. Uses on Sunday and wednesday     sodium chloride (MURO 128) 5 % ophthalmic solution Place 1 drop into the right eye at bedtime.      temazepam (RESTORIL) 7.5 MG capsule Take 1 capsule (7.5 mg total) by mouth at bedtime as needed for sleep. 30 capsule 3   No current facility-administered medications for this visit.    PHYSICAL EXAMINATION: ECOG PERFORMANCE STATUS: 1 - Symptomatic but completely ambulatory  There were no vitals filed for this visit. Wt Readings from Last 3 Encounters:  11/12/22 116 lb 6.4 oz (52.8 kg)  10/23/22 116 lb 6.4 oz (52.8 kg)  10/17/22 118 lb 12.8 oz (53.9 kg)     No vitals taken today, Exam not performed today  LABORATORY DATA:  I have reviewed the  data as listed    Latest Ref Rng & Units 10/23/2022   12:33 PM 10/17/2022    4:13 PM 08/15/2022   11:39 AM  CBC  WBC 4.0 - 10.5 K/uL 7.6  6.6  5.2   Hemoglobin 12.0 - 15.0 g/dL 14.7  14.1  14.2   Hematocrit 36.0 - 46.0 % 42.8  42.4  40.5   Platelets 150 - 400 K/uL 186  262  225         Latest Ref Rng & Units 10/23/2022   12:33 PM 10/17/2022    4:13 PM 08/15/2022   11:39 AM  CMP  Glucose 70 - 99 mg/dL 97  92  89   BUN 8 - 23 mg/dL  25  11  12    Creatinine 0.44 - 1.00 mg/dL 0.78  0.66  0.75   Sodium 135 - 145 mmol/L 136  141  140   Potassium 3.5 - 5.1 mmol/L 3.7  3.7  3.7   Chloride 98 - 111 mmol/L 98  99  101   CO2 22 - 32 mmol/L 30  35  30   Calcium 8.9 - 10.3 mg/dL 8.5  8.7  8.1   Total Protein 6.5 - 8.1 g/dL 7.5  6.5  7.0   Total Bilirubin 0.3 - 1.2 mg/dL 0.6  0.5  0.6   Alkaline Phos 38 - 126 U/L 79     AST 15 - 41 U/L 23  19  25    ALT 0 - 44 U/L 13  13  20        RADIOGRAPHIC STUDIES: I have personally reviewed the radiological images as listed and agreed with the findings in the report. No results found.    No orders of the defined types were placed in this encounter.  All questions were answered. The patient knows to call the clinic with any problems, questions or concerns. No barriers to learning was detected. The total time spent in the appointment was 15 minutes.     Truitt Merle, MD 11/21/2022   Felicity Coyer am acting as scribe for Truitt Merle, MD.   I have reviewed the above documentation for accuracy and completeness, and I agree with the above.

## 2022-11-28 ENCOUNTER — Encounter: Payer: Self-pay | Admitting: Orthopedic Surgery

## 2022-11-28 ENCOUNTER — Ambulatory Visit (INDEPENDENT_AMBULATORY_CARE_PROVIDER_SITE_OTHER): Payer: Medicare Other | Admitting: Orthopedic Surgery

## 2022-11-28 VITALS — BP 106/70 | HR 69 | Temp 97.6°F | Resp 16 | Ht 59.0 in | Wt 116.0 lb

## 2022-11-28 DIAGNOSIS — H903 Sensorineural hearing loss, bilateral: Secondary | ICD-10-CM

## 2022-11-28 DIAGNOSIS — H1820 Unspecified corneal edema: Secondary | ICD-10-CM | POA: Diagnosis not present

## 2022-11-28 DIAGNOSIS — F5101 Primary insomnia: Secondary | ICD-10-CM | POA: Diagnosis not present

## 2022-11-28 DIAGNOSIS — H18423 Band keratopathy, bilateral: Secondary | ICD-10-CM | POA: Diagnosis not present

## 2022-11-28 MED ORDER — TEMAZEPAM 15 MG PO CAPS: 15.0000 mg | ORAL_CAPSULE | Freq: Every day | ORAL | 2 refills | Status: DC

## 2022-11-28 NOTE — Patient Instructions (Addendum)
[facility] [phone]  Try one Zyrtec every night x 2 weeks> may help with vertigo or hearing

## 2022-11-28 NOTE — Progress Notes (Signed)
Careteam: Patient Care Team: [name], NP as PCP - General (Adult Health Nurse Practitioner) O'Neal, Ronnald Ramp, MD as PCP - Cardiology (Cardiology) Clarene Duke, Karma Lew, RN as [facility] Care Management [place], Enrique Sack as [facility] Care Management O'Neal, Ronnald Ramp, MD as Consulting Physician (Cardiology)  Seen by: Hazle Nordmann, AGNP-C  PLACE OF SERVICE:  [facility]  Advanced Directive information Does Patient Have a Medical Advance Directive?: No, Does patient want to make changes to medical advance directive?: No - Patient declined  Allergies  Allergen Reactions   Azithromycin Nausea Only and Other (See Comments)   Compazine [Prochlorperazine Edisylate] Nausea Only   Demerol Nausea Only   Dolophine [Methadone] Nausea Only   Ebastine Nausea Only    EBS   Erythromycin Nausea Only and Other (See Comments)   Gluten Meal Diarrhea   Ibuprofen Nausea Only and Other (See Comments)   Meperidine Hcl Other (See Comments)   Morphine Sulfate Other (See Comments)   Statins Other (See Comments)    myalgias   Tetanus Toxoids Nausea Only   Tetracycline Other (See Comments)   Tetracyclines & Related Nausea Only    Chief Complaint  Patient presents with   Acute Visit    Patient complains of trouble sleeping due to medication changes. States that Temazepam 7.5mg  isnt working.      HPI: Patient is a 89 y.o. female seen today for acute visit due to insomnia.   Temazepam weaned 08/2022 after last hospitalization. She tried 3-4 months with reduced dose without success. She has been napping more since medication reduction. Granddaughter also reports increased anxiety at night and panic attack 1 weeks ago. Requesting   No recent falls or injuries. Ambulating with walker well.   HOH after stroke. She described it as " head is in a barrel." Requesting hearing test.    Review of Systems:  Review of Systems  Constitutional:  Positive for malaise/fatigue.  Negative for chills and fever.  HENT:  Positive for hearing loss.   Respiratory:  Negative for cough, shortness of breath and wheezing.   Cardiovascular:  Negative for chest pain and leg swelling.  Musculoskeletal:  Negative for falls and joint pain.  Neurological:  Negative for dizziness and headaches.  Psychiatric/Behavioral:  Negative for depression and memory loss. The patient is nervous/anxious and has insomnia.     Past Medical History:  Diagnosis Date   Bladder prolapse, female, acquired    Cancer    thyroid   Chest pain    no known ischemic heart disease; negative Myoview July 2013. EF 75% with no ischemia.    Chronic anticoagulation    Chronic atrial fibrillation    managed with rate control and coumadin   Chronic diastolic heart failure    GERD (gastroesophageal reflux disease)    Heart disease    History of congenital mitral regurgitation    mild   History of heart attack    Multiple    History of mitral valve prolapse 06/15/2008   a. echo 1/14: mild LVH, EF 60-65%, mild MR, mild to mod BAE, PASP 35   Hypercholesterolemia    Hypertension    Hypothyroidism    Stroke    Past Surgical History:  Procedure Laterality Date   ABDOMINAL HYSTERECTOMY     CATARACT EXTRACTION     CHOLECYSTECTOMY     Thyroidectomy     TONSILLECTOMY     Social History:   reports that she has never smoked. She has  never used smokeless tobacco. She reports that she does not drink alcohol and does not use drugs.  Family History  Problem Relation Age of Onset   Stroke Mother 53   Stroke Father 96   Arthritis Daughter    Gout Daughter    Immunocompromised Granddaughter    Heart attack Neg Hx    Heart disease Neg Hx     Medications: Patient's Medications  New Prescriptions   No medications on file  Previous Medications   ACETAMINOPHEN (TYLENOL) 500 MG TABLET    Take 2 tablets (1,000 mg total) by mouth every 8 (eight) hours as needed.   AMLODIPINE (NORVASC) 2.5 MG TABLET    TAKE 2  TABLETS BY MOUTH  DAILY   APIXABAN (ELIQUIS) 2.5 MG TABS TABLET    Take 1 tablet (2.5 mg total) by mouth 2 (two) times daily.   ATENOLOL (TENORMIN) 25 MG TABLET    Take 1 tablet (25 mg total) by mouth 2 (two) times daily.   DORZOLAMIDE (TRUSOPT) 2 % OPHTHALMIC SOLUTION    Place 1 drop into the left eye 2 (two) times daily.   EZETIMIBE (ZETIA) 10 MG TABLET    Take 1 tablet (10 mg total) by mouth daily.   FUROSEMIDE (LASIX) 40 MG TABLET    Take 1 tablet (40 mg total) by mouth daily.   LATANOPROST (XALATAN) 0.005 % OPHTHALMIC SOLUTION    Place 1 drop into both eyes at bedtime.    LEVOTHYROXINE (SYNTHROID) 50 MCG TABLET    Take 1 tablet (50 mcg total) by mouth daily.   MULTIPLE VITAMIN (MULTIVITAMIN WITH MINERALS) TABS TABLET    Take 1 tablet by mouth daily.   NITROGLYCERIN (NITROSTAT) 0.4 MG SL TABLET    DISSOLVE 1 TABLET UNDER THE TONGUE EVERY 5 MINUTES FOR 3 DOSES AS NEEDED  FOR  CHEST  PAIN   OMEPRAZOLE (PRILOSEC) 20 MG CAPSULE    Take 20 mg by mouth daily as needed (acid reflux).   PRAMIPEXOLE (MIRAPEX) 0.125 MG TABLET    TAKE 1 TABLET BY MOUTH AT  BEDTIME   PREMARIN VAGINAL CREAM    Place 2 g vaginally 2 (two) times a week. Uses on Sunday and wednesday   SODIUM CHLORIDE (MURO 128) 5 % OPHTHALMIC SOLUTION    Place 1 drop into the right eye at bedtime.    TEMAZEPAM (RESTORIL) 7.5 MG CAPSULE    Take 1 capsule (7.5 mg total) by mouth at bedtime as needed for sleep.  Modified Medications   No medications on file  Discontinued Medications   No medications on file    Physical Exam:  Vitals:   11/28/22 1459  BP: 106/70  Pulse: 69  Resp: 16  Temp: 97.6 F (36.4 C)  SpO2: 92%  Weight: 116 lb (52.6 kg)  Height: 4\' 11"  (1.499 m)   Body mass index is 23.43 kg/m. Wt Readings from Last 3 Encounters:  11/28/22 116 lb (52.6 kg)  11/12/22 116 lb 6.4 oz (52.8 kg)  10/23/22 116 lb 6.4 oz (52.8 kg)    Physical Exam Vitals reviewed.  Constitutional:      General: She is not in acute  distress. HENT:     Head: Normocephalic.     Right Ear: There is no impacted cerumen.     Left Ear: There is no impacted cerumen.     Nose: Nose normal.     Mouth/Throat:     Mouth: Mucous membranes are moist.  Eyes:     General:  Right eye: No discharge.        Left eye: No discharge.     Extraocular Movements:     Right eye: Normal extraocular motion and no nystagmus.     Left eye: Normal extraocular motion and no nystagmus.  Cardiovascular:     Rate and Rhythm: Normal rate. Rhythm irregular.     Pulses: Normal pulses.     Heart sounds: Murmur heard.  Pulmonary:     Effort: Pulmonary effort is normal. No respiratory distress.     Breath sounds: Normal breath sounds. No wheezing.  Abdominal:     General: Bowel sounds are normal. There is no distension.     Palpations: Abdomen is soft.     Tenderness: There is no abdominal tenderness.  Musculoskeletal:     Cervical back: Neck supple.     Right lower leg: No edema.     Left lower leg: No edema.  Skin:    General: Skin is warm.     Capillary Refill: Capillary refill takes less than 2 seconds.  Neurological:     General: No focal deficit present.     Mental Status: She is alert and oriented to person, place, and time.     Motor: Weakness present.     Gait: Gait abnormal.     Comments: walker  Psychiatric:        Mood and Affect: Mood normal.        Behavior: Behavior normal.     Labs reviewed: Basic Metabolic Panel: Recent Labs    05/20/22 1120 06/24/22 1624 07/08/22 1454 07/08/22 1738 07/08/22 2225 08/15/22 1139 10/17/22 1613 10/23/22 1233  NA  --    < >  --  139   < > 140 141 136  K  --    < >  --  3.5   < > 3.7 3.7 3.7  CL  --    < >  --  106   < > 101 99 98  CO2  --    < >  --  27   < > 30 35* 30  GLUCOSE  --    < >  --  125*   < > 89 92 97  BUN  --    < >  --  9   < > 12 11 25*  CREATININE  --    < >  --  0.65   < > 0.75 0.66 0.78  CALCIUM  --    < >  --  7.6*   < > 8.1* 8.7 8.5*  MG  --   --    --  1.8  --   --   --   --   TSH 17.58*  --  19.195*  --   --   --  0.24*  --    < > = values in this interval not displayed.   Liver Function Tests: Recent Labs    08/15/22 1139 10/17/22 1613 10/23/22 1233  AST 25 19 23   ALT 20 13 13   ALKPHOS  --   --  79  BILITOT 0.6 0.5 0.6  PROT 7.0 6.5 7.5  ALBUMIN  --   --  4.0   No results for input(s): "LIPASE", "AMYLASE" in the last 8760 hours. No results for input(s): "AMMONIA" in the last 8760 hours. CBC: Recent Labs    08/15/22 1139 10/17/22 1613 10/23/22 1233  WBC 5.2 6.6 7.6  NEUTROABS 2,642 3,168 4.3  HGB 14.2  14.1 14.7  HCT 40.5 42.4 42.8  MCV 96.4 95.5 95.5  PLT 225 262 186   Lipid Panel: Recent Labs    07/09/22 0332  CHOL 207*  HDL 82  LDLCALC 108*  TRIG 84  CHOLHDL 2.5   TSH: Recent Labs    05/20/22 1120 07/08/22 1454 10/17/22 1613  TSH 17.58* 19.195* 0.24*   A1C: Lab Results  Component Value Date   HGBA1C 5.8 (H) 07/09/2022     Assessment/Plan 1. Primary insomnia - ongoing - failed GDR - will increase temazepam to previous dose - discussed sedation and falls risks> advised to stop med and contact PCP - temazepam (RESTORIL) 15 MG capsule; Take 1 capsule (15 mg total) by mouth at bedtime.  Dispense: 30 capsule; Refill: 2  2. Sensorineural hearing loss (SNHL) of both ears - stroke 07/08/2022 - sudden change in hearing after event - described as " head in barrel" - exam unremarkable - Ambulatory referral to Audiology  Total time: 24 minutes. Greater than 50% of total time spent doing patient education regarding insomnia and hearing loss including symptom/medication management.   Next appt: none Kristen Whitehead  [facility] [phone]

## 2022-12-01 DIAGNOSIS — M6281 Muscle weakness (generalized): Secondary | ICD-10-CM | POA: Diagnosis not present

## 2022-12-01 DIAGNOSIS — R2681 Unsteadiness on feet: Secondary | ICD-10-CM | POA: Diagnosis not present

## 2022-12-01 DIAGNOSIS — R2689 Other abnormalities of gait and mobility: Secondary | ICD-10-CM | POA: Diagnosis not present

## 2022-12-01 DIAGNOSIS — I69398 Other sequelae of cerebral infarction: Secondary | ICD-10-CM | POA: Diagnosis not present

## 2022-12-02 DIAGNOSIS — H18423 Band keratopathy, bilateral: Secondary | ICD-10-CM | POA: Diagnosis not present

## 2022-12-02 DIAGNOSIS — H1820 Unspecified corneal edema: Secondary | ICD-10-CM | POA: Diagnosis not present

## 2022-12-02 DIAGNOSIS — H04123 Dry eye syndrome of bilateral lacrimal glands: Secondary | ICD-10-CM | POA: Diagnosis not present

## 2022-12-05 DIAGNOSIS — H04123 Dry eye syndrome of bilateral lacrimal glands: Secondary | ICD-10-CM | POA: Diagnosis not present

## 2022-12-05 DIAGNOSIS — H1820 Unspecified corneal edema: Secondary | ICD-10-CM | POA: Diagnosis not present

## 2022-12-06 ENCOUNTER — Other Ambulatory Visit: Payer: Self-pay | Admitting: General Practice

## 2022-12-07 ENCOUNTER — Other Ambulatory Visit: Payer: Self-pay | Admitting: Cardiovascular Disease

## 2022-12-07 DIAGNOSIS — I4821 Permanent atrial fibrillation: Secondary | ICD-10-CM

## 2022-12-09 NOTE — Telephone Encounter (Signed)
Pt last saw Dr Flora Lipps 11/12/22, last labs 10/23/22 Creat 0.78, age 89, weight 52.6kg, based on specified criteria pt is on appropriate dosage of Eliqui 2.5mg  BID for afib.  Will refill rx.

## 2022-12-18 DIAGNOSIS — H353211 Exudative age-related macular degeneration, right eye, with active choroidal neovascularization: Secondary | ICD-10-CM | POA: Diagnosis not present

## 2022-12-26 ENCOUNTER — Observation Stay (HOSPITAL_COMMUNITY)
Admission: EM | Admit: 2022-12-26 | Discharge: 2022-12-31 | Disposition: A | Discharge status: Skilled Nursing Facility | Payer: Medicare Other | Attending: Family Medicine | Admitting: Family Medicine

## 2022-12-26 ENCOUNTER — Emergency Department (HOSPITAL_COMMUNITY): Payer: Medicare Other

## 2022-12-26 ENCOUNTER — Encounter (HOSPITAL_COMMUNITY): Payer: Self-pay

## 2022-12-26 ENCOUNTER — Ambulatory Visit: Payer: Medicare Other | Admitting: Audiologist

## 2022-12-26 ENCOUNTER — Other Ambulatory Visit: Payer: Self-pay

## 2022-12-26 DIAGNOSIS — S79912A Unspecified injury of left hip, initial encounter: Secondary | ICD-10-CM | POA: Diagnosis not present

## 2022-12-26 DIAGNOSIS — R2681 Unsteadiness on feet: Secondary | ICD-10-CM | POA: Diagnosis not present

## 2022-12-26 DIAGNOSIS — S32592A Other specified fracture of left pubis, initial encounter for closed fracture: Secondary | ICD-10-CM | POA: Diagnosis not present

## 2022-12-26 DIAGNOSIS — I480 Paroxysmal atrial fibrillation: Secondary | ICD-10-CM | POA: Insufficient documentation

## 2022-12-26 DIAGNOSIS — Z66 Do not resuscitate: Secondary | ICD-10-CM | POA: Insufficient documentation

## 2022-12-26 DIAGNOSIS — S3210XD Unspecified fracture of sacrum, subsequent encounter for fracture with routine healing: Secondary | ICD-10-CM | POA: Diagnosis not present

## 2022-12-26 DIAGNOSIS — Z85850 Personal history of malignant neoplasm of thyroid: Secondary | ICD-10-CM | POA: Diagnosis not present

## 2022-12-26 DIAGNOSIS — M25562 Pain in left knee: Secondary | ICD-10-CM | POA: Diagnosis not present

## 2022-12-26 DIAGNOSIS — I110 Hypertensive heart disease with heart failure: Secondary | ICD-10-CM | POA: Insufficient documentation

## 2022-12-26 DIAGNOSIS — Z7409 Other reduced mobility: Secondary | ICD-10-CM | POA: Diagnosis not present

## 2022-12-26 DIAGNOSIS — S32502A Unspecified fracture of left pubis, initial encounter for closed fracture: Secondary | ICD-10-CM | POA: Diagnosis not present

## 2022-12-26 DIAGNOSIS — Z79899 Other long term (current) drug therapy: Secondary | ICD-10-CM | POA: Diagnosis not present

## 2022-12-26 DIAGNOSIS — W19XXXA Unspecified fall, initial encounter: Secondary | ICD-10-CM | POA: Diagnosis not present

## 2022-12-26 DIAGNOSIS — Y92001 Dining room of unspecified non-institutional (private) residence as the place of occurrence of the external cause: Secondary | ICD-10-CM | POA: Insufficient documentation

## 2022-12-26 DIAGNOSIS — I4821 Permanent atrial fibrillation: Secondary | ICD-10-CM

## 2022-12-26 DIAGNOSIS — E039 Hypothyroidism, unspecified: Secondary | ICD-10-CM | POA: Diagnosis not present

## 2022-12-26 DIAGNOSIS — I10 Essential (primary) hypertension: Secondary | ICD-10-CM | POA: Diagnosis present

## 2022-12-26 DIAGNOSIS — W228XXA Striking against or struck by other objects, initial encounter: Secondary | ICD-10-CM | POA: Diagnosis not present

## 2022-12-26 DIAGNOSIS — Z7901 Long term (current) use of anticoagulants: Secondary | ICD-10-CM | POA: Diagnosis not present

## 2022-12-26 DIAGNOSIS — Z9181 History of falling: Secondary | ICD-10-CM | POA: Diagnosis not present

## 2022-12-26 DIAGNOSIS — S3282XA Multiple fractures of pelvis without disruption of pelvic ring, initial encounter for closed fracture: Secondary | ICD-10-CM | POA: Diagnosis not present

## 2022-12-26 DIAGNOSIS — R079 Chest pain, unspecified: Secondary | ICD-10-CM | POA: Diagnosis not present

## 2022-12-26 DIAGNOSIS — I5032 Chronic diastolic (congestive) heart failure: Secondary | ICD-10-CM | POA: Diagnosis not present

## 2022-12-26 DIAGNOSIS — K5900 Constipation, unspecified: Secondary | ICD-10-CM | POA: Diagnosis not present

## 2022-12-26 DIAGNOSIS — E876 Hypokalemia: Secondary | ICD-10-CM | POA: Insufficient documentation

## 2022-12-26 DIAGNOSIS — M25552 Pain in left hip: Secondary | ICD-10-CM | POA: Diagnosis not present

## 2022-12-26 DIAGNOSIS — F5101 Primary insomnia: Secondary | ICD-10-CM

## 2022-12-26 DIAGNOSIS — E871 Hypo-osmolality and hyponatremia: Secondary | ICD-10-CM | POA: Diagnosis not present

## 2022-12-26 DIAGNOSIS — S32512A Fracture of superior rim of left pubis, initial encounter for closed fracture: Secondary | ICD-10-CM | POA: Diagnosis not present

## 2022-12-26 DIAGNOSIS — I4891 Unspecified atrial fibrillation: Secondary | ICD-10-CM | POA: Diagnosis present

## 2022-12-26 DIAGNOSIS — I499 Cardiac arrhythmia, unspecified: Secondary | ICD-10-CM | POA: Diagnosis not present

## 2022-12-26 DIAGNOSIS — Z8673 Personal history of transient ischemic attack (TIA), and cerebral infarction without residual deficits: Secondary | ICD-10-CM | POA: Insufficient documentation

## 2022-12-26 LAB — CBC WITH DIFFERENTIAL/PLATELET
Abs Immature Granulocytes: 0.05 10*3/uL (ref 0.00–0.07)
Basophils Absolute: 0 10*3/uL (ref 0.0–0.1)
Basophils Relative: 0 %
Eosinophils Absolute: 0.1 10*3/uL (ref 0.0–0.5)
Eosinophils Relative: 1 %
HCT: 39.1 % (ref 36.0–46.0)
Hemoglobin: 13.2 g/dL (ref 12.0–15.0)
Immature Granulocytes: 1 %
Lymphocytes Relative: 22 %
Lymphs Abs: 2.1 10*3/uL (ref 0.7–4.0)
MCH: 32 pg (ref 26.0–34.0)
MCHC: 33.8 g/dL (ref 30.0–36.0)
MCV: 94.7 fL (ref 80.0–100.0)
Monocytes Absolute: 0.8 10*3/uL (ref 0.1–1.0)
Monocytes Relative: 8 %
Neutro Abs: 6.9 10*3/uL (ref 1.7–7.7)
Neutrophils Relative %: 68 %
Platelets: 208 10*3/uL (ref 150–400)
RBC: 4.13 MIL/uL (ref 3.87–5.11)
RDW: 14.6 % (ref 11.5–15.5)
WBC: 10 10*3/uL (ref 4.0–10.5)
nRBC: 0 % (ref 0.0–0.2)

## 2022-12-26 LAB — BASIC METABOLIC PANEL
Anion gap: 10 (ref 5–15)
BUN: 18 mg/dL (ref 8–23)
CO2: 26 mmol/L (ref 22–32)
Calcium: 7.7 mg/dL — ABNORMAL LOW (ref 8.9–10.3)
Chloride: 98 mmol/L (ref 98–111)
Creatinine, Ser: 0.74 mg/dL (ref 0.44–1.00)
GFR, Estimated: >60 mL/min (ref >60)
Glucose, Bld: 152 mg/dL — ABNORMAL HIGH (ref 70–99)
Potassium: 2.8 mmol/L — ABNORMAL LOW (ref 3.5–5.1)
Sodium: 134 mmol/L — ABNORMAL LOW (ref 135–145)

## 2022-12-26 LAB — TYPE AND SCREEN
ABO/RH(D): A POS
Antibody Screen: NEGATIVE

## 2022-12-26 LAB — ABO/RH: ABO/RH(D): A POS

## 2022-12-26 LAB — PROTIME-INR
INR: 1 (ref 0.8–1.2)
Prothrombin Time: 13.4 seconds (ref 11.4–15.2)

## 2022-12-26 MED ORDER — LATANOPROST 0.005 % OP SOLN
1.0000 [drp] | Freq: Every day | OPHTHALMIC | Status: DC
  Administered 2022-12-27 – 2022-12-30 (×4): 1 [drp] via OPHTHALMIC
  Filled 2022-12-26: qty 2.5

## 2022-12-26 MED ORDER — APIXABAN 2.5 MG PO TABS
2.5000 mg | ORAL_TABLET | Freq: Two times a day (BID) | ORAL | Status: DC
  Administered 2022-12-26 – 2022-12-31 (×11): 2.5 mg via ORAL
  Filled 2022-12-26 (×12): qty 1

## 2022-12-26 MED ORDER — OXYCODONE HCL 5 MG PO TABS
2.5000 mg | ORAL_TABLET | ORAL | Status: DC | PRN
  Administered 2022-12-26 – 2022-12-31 (×11): 2.5 mg via ORAL
  Filled 2022-12-26 (×11): qty 1

## 2022-12-26 MED ORDER — DORZOLAMIDE HCL 2 % OP SOLN
1.0000 [drp] | Freq: Two times a day (BID) | OPHTHALMIC | Status: DC
  Administered 2022-12-26 – 2022-12-31 (×11): 1 [drp] via OPHTHALMIC
  Filled 2022-12-26: qty 10

## 2022-12-26 MED ORDER — TEMAZEPAM 15 MG PO CAPS
15.0000 mg | ORAL_CAPSULE | Freq: Every evening | ORAL | Status: DC | PRN
  Administered 2022-12-26 – 2022-12-31 (×4): 15 mg via ORAL
  Filled 2022-12-26 (×4): qty 1

## 2022-12-26 MED ORDER — ONDANSETRON HCL 4 MG/2ML IJ SOLN
4.0000 mg | Freq: Four times a day (QID) | INTRAMUSCULAR | Status: DC | PRN
  Administered 2022-12-26 – 2022-12-30 (×3): 4 mg via INTRAVENOUS
  Filled 2022-12-26 (×3): qty 2

## 2022-12-26 MED ORDER — METHOCARBAMOL 500 MG PO TABS
500.0000 mg | ORAL_TABLET | Freq: Once | ORAL | Status: AC
  Administered 2022-12-26: 500 mg via ORAL
  Filled 2022-12-26: qty 1

## 2022-12-26 MED ORDER — ONDANSETRON HCL 4 MG PO TABS: 4.0000 mg | ORAL_TABLET | Freq: Four times a day (QID) | ORAL | Status: DC | PRN

## 2022-12-26 MED ORDER — ACETAMINOPHEN 500 MG PO TABS
1000.0000 mg | ORAL_TABLET | Freq: Once | ORAL | Status: AC
  Administered 2022-12-26: 1000 mg via ORAL
  Filled 2022-12-26: qty 2

## 2022-12-26 MED ORDER — FUROSEMIDE 40 MG PO TABS
40.0000 mg | ORAL_TABLET | Freq: Every day | ORAL | Status: DC
  Administered 2022-12-26 – 2022-12-31 (×6): 40 mg via ORAL
  Filled 2022-12-26 (×6): qty 1

## 2022-12-26 MED ORDER — PRAMIPEXOLE DIHYDROCHLORIDE 0.125 MG PO TABS
0.1250 mg | ORAL_TABLET | Freq: Every day | ORAL | Status: DC
  Administered 2022-12-26 – 2022-12-30 (×5): 0.125 mg via ORAL
  Filled 2022-12-26: qty 0.5
  Filled 2022-12-26: qty 1
  Filled 2022-12-26: qty 0.5
  Filled 2022-12-26: qty 1
  Filled 2022-12-26: qty 0.5
  Filled 2022-12-26: qty 1
  Filled 2022-12-26: qty 0.5

## 2022-12-26 MED ORDER — ATENOLOL 25 MG PO TABS
25.0000 mg | ORAL_TABLET | Freq: Two times a day (BID) | ORAL | Status: DC
  Administered 2022-12-26 – 2022-12-31 (×11): 25 mg via ORAL
  Filled 2022-12-26 (×12): qty 1

## 2022-12-26 MED ORDER — FENTANYL CITRATE PF 50 MCG/ML IJ SOSY: 50.0000 ug | PREFILLED_SYRINGE | Freq: Once | INTRAMUSCULAR | Status: DC

## 2022-12-26 MED ORDER — POTASSIUM CHLORIDE CRYS ER 20 MEQ PO TBCR
40.0000 meq | EXTENDED_RELEASE_TABLET | Freq: Two times a day (BID) | ORAL | Status: AC
  Administered 2022-12-26 (×2): 40 meq via ORAL
  Filled 2022-12-26 (×2): qty 2

## 2022-12-26 MED ORDER — FENTANYL CITRATE PF 50 MCG/ML IJ SOSY
50.0000 ug | PREFILLED_SYRINGE | INTRAMUSCULAR | Status: DC | PRN
  Administered 2022-12-26: 50 ug via INTRAVENOUS
  Filled 2022-12-26: qty 1

## 2022-12-26 MED ORDER — SODIUM CHLORIDE (PF) 0.9 % IJ SOLN
INTRAMUSCULAR | Status: AC
  Filled 2022-12-26: qty 50

## 2022-12-26 MED ORDER — POTASSIUM CHLORIDE CRYS ER 20 MEQ PO TBCR
40.0000 meq | EXTENDED_RELEASE_TABLET | ORAL | Status: AC
  Administered 2022-12-26 (×2): 40 meq via ORAL
  Filled 2022-12-26 (×2): qty 2

## 2022-12-26 MED ORDER — ONDANSETRON HCL 4 MG/2ML IJ SOLN
4.0000 mg | Freq: Once | INTRAMUSCULAR | Status: AC
  Administered 2022-12-26: 4 mg via INTRAVENOUS
  Filled 2022-12-26: qty 2

## 2022-12-26 MED ORDER — ACETAMINOPHEN 325 MG PO TABS
650.0000 mg | ORAL_TABLET | ORAL | Status: DC
  Administered 2022-12-26 – 2022-12-31 (×28): 650 mg via ORAL
  Filled 2022-12-26 (×31): qty 2

## 2022-12-26 MED ORDER — EZETIMIBE 10 MG PO TABS
10.0000 mg | ORAL_TABLET | Freq: Every day | ORAL | Status: DC
  Administered 2022-12-26 – 2022-12-31 (×6): 10 mg via ORAL
  Filled 2022-12-26 (×7): qty 1

## 2022-12-26 MED ORDER — IOHEXOL 300 MG/ML  SOLN
80.0000 mL | Freq: Once | INTRAMUSCULAR | Status: AC | PRN
  Administered 2022-12-26: 80 mL via INTRAVENOUS

## 2022-12-26 MED ORDER — LEVOTHYROXINE SODIUM 50 MCG PO TABS
50.0000 ug | ORAL_TABLET | Freq: Every day | ORAL | Status: DC
  Administered 2022-12-27 – 2022-12-31 (×5): 50 ug via ORAL
  Filled 2022-12-26 (×6): qty 1

## 2022-12-26 NOTE — Assessment & Plan Note (Addendum)
Continue Eliquis 2.5 mg bid. Continue atenolol 25 mg bid.

## 2022-12-26 NOTE — H&P (Signed)
History and Physical    Kristen Whitehead ZOX:[id number] DOB: Nov 03, 1922 DOA: 12/26/2022  DOS: the patient was seen and examined on 12/26/2022  PCP: [name], NP   Patient coming from: Home  I have personally briefly reviewed patient's old medical records in [facility]  CC: Fall at home HPI: 89 yo WF with hx of PAF, on Elquis, HTN, chronic diastolic CHF, hypothyroidism presents to ER after a fall at home. Pt was in her dining room when she fell.  Patient states that her toe got caught on the leg of a chair and caused her to fall on her left hip.  Patient placed her life alert button.  Fire department had to force open the door.  Patient denies any head trauma.  Patient usually lives with her granddaughter in [place] for about 3 weeks and then comes home to [place] for about a week.  She has been doing this since her stroke last year.  Today was the first day she has been home in [place] for the last 3 weeks.  On arrival temp 98.6, heart rate 158, blood pressure 145/127.  White count 10.0, hemoglobin 13.2, platelet 208  Sodium 134, potassium 2.8, BUN of 18, creatinine 0.7, glucose 152  Hip x-ray showed a inferior pubic ramus fracture on the left extending into the pubic symphysis medially.  No femoral fractures were noted.  [facility] hospitalist contacted for admission.     ED Course: xrays shows left inf pubic ramus fracture.  Review of Systems:  Review of Systems  Constitutional: Negative.   HENT: Negative.    Eyes: Negative.   Respiratory: Negative.    Cardiovascular: Negative.   Gastrointestinal: Negative.   Genitourinary: Negative.   Musculoskeletal:  Positive for falls.       Left hip pain  Skin: Negative.   Neurological: Negative.   Endo/Heme/Allergies: Negative.   Psychiatric/Behavioral: Negative.    All other systems reviewed and are negative.   Past Medical History:  Diagnosis Date   Acute on chronic diastolic CHF (congestive heart failure)  (HCC) 02/01/2014   Bladder prolapse, female, acquired    Cancer [facility])    thyroid   Chest pain    no known ischemic heart disease; negative Myoview July 2013. EF 75% with no ischemia.    Chronic anticoagulation    Chronic atrial fibrillation (HCC)    managed with rate control and coumadin   Chronic diastolic heart failure (HCC)    GERD (gastroesophageal reflux disease)    Heart disease    History of congenital mitral regurgitation    mild   History of heart attack    Multiple    History of mitral valve prolapse 06/15/2008   a. echo 1/14: mild LVH, EF 60-65%, mild MR, mild to mod BAE, PASP 35   Hypercholesterolemia    Hypertension    Hypothyroidism    Stroke [facility])     Past Surgical History:  Procedure Laterality Date   ABDOMINAL HYSTERECTOMY     CATARACT EXTRACTION     CHOLECYSTECTOMY     Thyroidectomy     TONSILLECTOMY       reports that she has never smoked. She has never used smokeless tobacco. She reports that she does not drink alcohol and does not use drugs.  Allergies  Allergen Reactions   Azithromycin Nausea Only and Other (See Comments)   Compazine [Prochlorperazine Edisylate] Nausea Only   Demerol Nausea Only   Dolophine [Methadone] Nausea Only   Ebastine Nausea Only  EBS   Erythromycin Nausea Only and Other (See Comments)   Gluten Meal Diarrhea   Ibuprofen Nausea Only and Other (See Comments)   Meperidine Hcl Other (See Comments)   Morphine Sulfate Other (See Comments)   Statins Other (See Comments)    myalgias   Tetanus Toxoids Nausea Only   Tetracycline Other (See Comments)   Tetracyclines & Related Nausea Only    Family History  Problem Relation Age of Onset   Stroke Mother 47   Stroke Father 61   Arthritis Daughter    Gout Daughter    Immunocompromised Granddaughter    Heart attack Neg Hx    Heart disease Neg Hx     Prior to Admission medications   Medication Sig Start Date End Date Taking? Authorizing Provider  acetaminophen  (TYLENOL) 500 MG tablet Take 2 tablets (1,000 mg total) by mouth every 8 (eight) hours as needed. 09/20/22   Medina-Vargas, Monina C, NP  amLODipine (NORVASC) 2.5 MG tablet TAKE 1 TABLET BY MOUTH AS NEEDED FOR SYSTOLIC BLOOD PRESSURE>140. 12/09/22   O'Neal, Ronnald Ramp, MD  atenolol (TENORMIN) 25 MG tablet Take 1 tablet (25 mg total) by mouth 2 (two) times daily. 09/24/22   Sande Rives, MD  dorzolamide (TRUSOPT) 2 % ophthalmic solution Place 1 drop into the left eye 2 (two) times daily. 09/24/19   [provider]  ELIQUIS 2.5 MG TABS tablet TAKE 1 TABLET(2.5 MG) BY MOUTH TWICE DAILY 12/09/22   O'Neal, Ronnald Ramp, MD  ezetimibe (ZETIA) 10 MG tablet Take 1 tablet (10 mg total) by mouth daily. 08/02/22   O'NealRonnald Ramp, MD  furosemide (LASIX) 40 MG tablet Take 1 tablet (40 mg total) by mouth daily. 08/02/22 07/28/23  O'NealRonnald Ramp, MD  latanoprost (XALATAN) 0.005 % ophthalmic solution Place 1 drop into both eyes at bedtime.  02/23/13   [provider]  levothyroxine (SYNTHROID) 50 MCG tablet Take 1 tablet (50 mcg total) by mouth daily. 10/20/22   Medina-Vargas, Monina C, NP  Multiple Vitamin (MULTIVITAMIN WITH MINERALS) TABS tablet Take 1 tablet by mouth daily. 07/16/22   Lynnae January, NP  nitroGLYCERIN (NITROSTAT) 0.4 MG SL tablet DISSOLVE 1 TABLET UNDER THE TONGUE EVERY 5 MINUTES FOR 3 DOSES AS NEEDED  FOR  CHEST  PAIN 12/23/18   Chilton Si, MD  omeprazole (PRILOSEC) 20 MG capsule Take 20 mg by mouth daily as needed (acid reflux).    [provider]  pramipexole (MIRAPEX) 0.125 MG tablet TAKE 1 TABLET BY MOUTH AT  BEDTIME 05/13/22   Fargo, Amy E, NP  PREMARIN vaginal cream Place 2 g vaginally 2 (two) times a week. Uses on Sunday and wednesday 03/25/18   [provider]  sodium chloride (MURO 128) 5 % ophthalmic solution Place 1 drop into the right eye at bedtime.     [provider]  temazepam (RESTORIL) 15 MG capsule Take 1 capsule (15  mg total) by mouth at bedtime. 11/28/22   [name], NP    Physical Exam: Vitals:   12/26/22 0029 12/26/22 0030 12/26/22 0032 12/26/22 0208  BP: (!) 145/127 119/85  107/77  Pulse: (!) 158 76  97  Resp: 19 (!) 29  (!) 22  Temp: 98.6 F (37 C)     SpO2: 96% 95%  95%  Weight:   52.6 kg   Height:   5\' 11"  (1.803 m)     Physical Exam Vitals and nursing note reviewed.  Constitutional:  General: She is not in acute distress.    Appearance: She is not toxic-appearing or diaphoretic.     Comments: Thin, female. No distress  HENT:     Head: Normocephalic and atraumatic.     Nose: Nose normal.  Eyes:     General: No scleral icterus. Cardiovascular:     Rate and Rhythm: Normal rate. Rhythm irregular.  Pulmonary:     Effort: Pulmonary effort is normal. No respiratory distress.     Breath sounds: Normal breath sounds. No wheezing.  Abdominal:     General: Abdomen is flat. Bowel sounds are normal. There is no distension.     Palpations: Abdomen is soft.     Tenderness: There is no abdominal tenderness.  Musculoskeletal:     Right lower leg: No edema.     Left lower leg: No edema.  Skin:    General: Skin is warm and dry.     Capillary Refill: Capillary refill takes less than 2 seconds.  Neurological:     General: No focal deficit present.     Mental Status: She is alert and oriented to person, place, and time.      Labs on Admission: I have personally reviewed following labs and imaging studies  CBC: Recent Labs  Lab 12/26/22 0040  WBC 10.0  NEUTROABS 6.9  HGB 13.2  HCT 39.1  MCV 94.7  PLT 208   Basic Metabolic Panel: Recent Labs  Lab 12/26/22 0040  NA 134*  K 2.8*  CL 98  CO2 26  GLUCOSE 152*  BUN 18  CREATININE 0.74  CALCIUM 7.7*   GFR: Estimated Creatinine Clearance: 32.6 mL/min (by C-G formula based on SCr of 0.74 mg/dL).  Coagulation Profile: Recent Labs  Lab 12/26/22 0040  INR 1.0   BNP (last 3 results) Recent Labs    07/08/22 1448   BNP 359.8*   Radiological Exams on Admission: I have personally reviewed images DG Knee Complete 4 Views Left  Result Date: 12/26/2022 CLINICAL DATA:  Recent fall with left knee pain, initial encounter EXAM: LEFT KNEE - COMPLETE 4+ VIEW COMPARISON:  None Available. FINDINGS: No evidence of fracture, dislocation, or joint effusion. No evidence of arthropathy or other focal bone abnormality. Soft tissues are unremarkable. IMPRESSION: No acute abnormality noted. Electronically Signed   By: Alcide Clever M.D.   On: 12/26/2022 01:27   DG Hip Unilat With Pelvis 2-3 Views Left  Result Date: 12/26/2022 CLINICAL DATA:  Recent fall with hip pain, initial encounter EXAM: DG HIP (WITH OR WITHOUT PELVIS) 3V LEFT COMPARISON:  None Available. FINDINGS: Pelvic ring shows fracture through the inferior pubic ramus on the left as well as extending through the pubic symphysis medially. Proximal femurs are well seated. No acute femoral fracture is noted. No soft tissue changes are seen. IMPRESSION: Fractures involving the inferior pubic ramus on the left and extending superiorly through the pubic symphysis. Electronically Signed   By: Alcide Clever M.D.   On: 12/26/2022 01:27   DG Chest Port 1 View  Result Date: 12/26/2022 CLINICAL DATA:  Status post fall.  Pain. EXAM: PORTABLE CHEST 1 VIEW COMPARISON:  Radiographs 07/08/2022 FINDINGS: Stable cardiomediastinal silhouette. Aortic atherosclerotic calcification. Chronic bronchitic changes. No focal consolidation, pleural effusion, or pneumothorax. The previous nodular opacity in the right lower lobe is no longer visualized. Demineralization. No definite displaced rib fractures. Remote right fourth posterior rib fracture. IMPRESSION: No acute cardiopulmonary process. Electronically Signed   By: Minerva Fester M.D.   On: 12/26/2022  01:23    EKG: My personal interpretation of EKG shows: no EKG  Assessment/Plan Principal Problem:   Inferior pubic ramus fracture, left, closed,  initial encounter [facility]) Active Problems:   Atrial fibrillation (HCC)   Essential hypertension   Hypothyroidism   Chronic anticoagulation   Chronic diastolic heart failure (HCC)   Hypokalemia   DNR (do not resuscitate)/DNI(Do Not Intubate)    Assessment and Plan: * Inferior pubic ramus fracture, left, closed, initial encounter (HCC) Observation med/surg bed. WBAT. PT consult. Scheduled tylenol 975 mg qid. Judicious use of opiates given her advanced age.  Chronic diastolic heart failure (HCC) Stable. Continue lasix 40 mg daily.  Chronic anticoagulation Continue Eliquis 2.5 mg bid.  Hypothyroidism Continue synthroid 50 mcg daily.  Essential hypertension Stable. Continue atenolol 25 mg bid  Atrial fibrillation (HCC) Continue Eliquis 2.5 mg bid. Continue atenolol 25 mg bid.  DNR (do not resuscitate)/DNI(Do Not Intubate) Verified pt's DNR/DNI status. Witnessed by her son-in-law Annette Stable.  Pt would like to fill out DNR form and HCPOA forms while in the hospital. Please consult chaplain to assist.  Hypokalemia Replete with oral kcl.   DVT prophylaxis: Eliquis Code Status: DNR/DNI(Do NOT Intubate). Discussed with pt. Witnessed by son-in-law Annette Stable Family Communication: discussed with pt and son-in-law Bill  Disposition Plan: return home with home health vs SNF  Consults called: none  Admission status: Observation, Telemetry bed   Carollee Herter, DO [facility] 12/26/2022, 2:45 AM

## 2022-12-26 NOTE — ED Triage Notes (Signed)
Pt BIB EMS with reports of a fall. No loc, pt did not hit her head. Pt is on blood thinners and is complaining of left hip pain.   18 g left ac  25 mcg fentanyl

## 2022-12-26 NOTE — Subjective & Objective (Addendum)
CC: Fall at home HPI: 89 yo WF with hx of PAF, on Elquis, HTN, chronic diastolic CHF, hypothyroidism presents to ER after a fall at home. Pt was in her dining room when she fell.  Patient states that her toe got caught on the leg of a chair and caused her to fall on her left hip.  Patient placed her life alert button.  Fire department had to force open the door.  Patient denies any head trauma.  Patient usually lives with her granddaughter in [place] for about 3 weeks and then comes home to [place] for about a week.  She has been doing this since her stroke last year.  Today was the first day she has been home in [place] for the last 3 weeks.  On arrival temp 98.6, heart rate 158, blood pressure 145/127.  White count 10.0, hemoglobin 13.2, platelet 208  Sodium 134, potassium 2.8, BUN of 18, creatinine 0.7, glucose 152  Hip x-ray showed a inferior pubic ramus fracture on the left extending into the pubic symphysis medially.  No femoral fractures were noted.  [facility] hospitalist contacted for admission.

## 2022-12-26 NOTE — Assessment & Plan Note (Signed)
Replete with oral kcl 

## 2022-12-26 NOTE — Assessment & Plan Note (Signed)
Continue synthroid 50 mcg daily 

## 2022-12-26 NOTE — ED Provider Notes (Signed)
[place] EMERGENCY DEPARTMENT AT [facility] Provider Note   CSN: [id number] Arrival date & time: 12/26/22  0019     History  Chief Complaint  Patient presents with   Fall   Hip Pain    Kristen Whitehead is a 89 y.o. female.  The history is provided by the patient.  Patient with extensive medical history including CVA, A-fib on Eliquis presents after mechanical fall.  Patient reports she was walking when she lost her step and fell directly on her left hip.  Denies head injury or LOC.  No headache or neck pain.  No back pain.  She only has pain in her left hip.  She lives alone and was able to call 911 She had otherwise been at her baseline prior to the fall    Past Medical History:  Diagnosis Date   Acute on chronic diastolic CHF (congestive heart failure) (HCC) 02/01/2014   Bladder prolapse, female, acquired    Cancer [facility])    thyroid   Chest pain    no known ischemic heart disease; negative Myoview July 2013. EF 75% with no ischemia.    Chronic anticoagulation    Chronic atrial fibrillation (HCC)    managed with rate control and coumadin   Chronic diastolic heart failure (HCC)    GERD (gastroesophageal reflux disease)    Heart disease    History of congenital mitral regurgitation    mild   History of heart attack    Multiple    History of mitral valve prolapse 06/15/2008   a. echo 1/14: mild LVH, EF 60-65%, mild MR, mild to mod BAE, PASP 35   Hypercholesterolemia    Hypertension    Hypothyroidism    Stroke [facility])     Home Medications Prior to Admission medications   Medication Sig Start Date End Date Taking? Authorizing Provider  acetaminophen (TYLENOL) 500 MG tablet Take 2 tablets (1,000 mg total) by mouth every 8 (eight) hours as needed. 09/20/22   Medina-Vargas, Monina C, NP  amLODipine (NORVASC) 2.5 MG tablet TAKE 1 TABLET BY MOUTH AS NEEDED FOR SYSTOLIC BLOOD PRESSURE>140. 12/09/22   O'Neal, Ronnald Ramp, MD  atenolol (TENORMIN) 25 MG tablet  Take 1 tablet (25 mg total) by mouth 2 (two) times daily. 09/24/22   Sande Rives, MD  dorzolamide (TRUSOPT) 2 % ophthalmic solution Place 1 drop into the left eye 2 (two) times daily. 09/24/19   [provider]  ELIQUIS 2.5 MG TABS tablet TAKE 1 TABLET(2.5 MG) BY MOUTH TWICE DAILY 12/09/22   O'Neal, Ronnald Ramp, MD  ezetimibe (ZETIA) 10 MG tablet Take 1 tablet (10 mg total) by mouth daily. 08/02/22   O'NealRonnald Ramp, MD  furosemide (LASIX) 40 MG tablet Take 1 tablet (40 mg total) by mouth daily. 08/02/22 07/28/23  O'NealRonnald Ramp, MD  latanoprost (XALATAN) 0.005 % ophthalmic solution Place 1 drop into both eyes at bedtime.  02/23/13   [provider]  levothyroxine (SYNTHROID) 50 MCG tablet Take 1 tablet (50 mcg total) by mouth daily. 10/20/22   Medina-Vargas, Monina C, NP  Multiple Vitamin (MULTIVITAMIN WITH MINERALS) TABS tablet Take 1 tablet by mouth daily. 07/16/22   Lynnae January, NP  nitroGLYCERIN (NITROSTAT) 0.4 MG SL tablet DISSOLVE 1 TABLET UNDER THE TONGUE EVERY 5 MINUTES FOR 3 DOSES AS NEEDED  FOR  CHEST  PAIN 12/23/18   Chilton Si, MD  omeprazole (PRILOSEC) 20 MG capsule Take 20 mg by mouth daily as needed (acid reflux).  [provider]  pramipexole (MIRAPEX) 0.125 MG tablet TAKE 1 TABLET BY MOUTH AT  BEDTIME 05/13/22   Fargo, Amy E, NP  PREMARIN vaginal cream Place 2 g vaginally 2 (two) times a week. Uses on Sunday and wednesday 03/25/18   [provider]  sodium chloride (MURO 128) 5 % ophthalmic solution Place 1 drop into the right eye at bedtime.     [provider]  temazepam (RESTORIL) 15 MG capsule Take 1 capsule (15 mg total) by mouth at bedtime. 11/28/22   Fargo, Amy E, NP      Allergies    Azithromycin, Compazine [prochlorperazine edisylate], Demerol, Dolophine [methadone], Ebastine, Erythromycin, Gluten meal, Ibuprofen, Meperidine hcl, Morphine sulfate, Statins, Tetanus toxoids, Tetracycline, and Tetracyclines &  related    Review of Systems   Review of Systems  Constitutional:  Negative for fever.  Musculoskeletal:  Positive for arthralgias.    Physical Exam Updated Vital Signs BP 107/77   Pulse 97   Temp 98.6 F (37 C)   Resp (!) 22   Ht 1.803 m (5\' 11" )   Wt 52.6 kg   SpO2 95%   BMI 16.17 kg/m  Physical Exam CONSTITUTIONAL: Elderly and frail HEAD: Normocephalic/atraumatic, no visible trauma EYES: EOMI/PERRL ENMT: Mucous membranes moist NECK: supple no meningeal signs SPINE/BACK:entire spine nontender No bruising/crepitance/stepoffs noted to spine CV: Tachycardic and irregular LUNGS: Lungs are clear to auscultation bilaterally, no apparent distress ABDOMEN: soft, nontender NEURO: Pt is awake/alert/appropriate EXTREMITIES: Distal pulses equal and intact in both feet.  Patient keeps left hip in a flexed position.  Significant tenderness noted with range of motion left hip.  Mild tenderness to left knee All other extremities/joints palpated/ranged and nontender SKIN: warm, color normal PSYCH: anxious  ED Results / Procedures / Treatments   Labs (all labs ordered are listed, but only abnormal results are displayed) Labs Reviewed  BASIC METABOLIC PANEL - Abnormal; Notable for the following components:      Result Value   Sodium 134 (*)    Potassium 2.8 (*)    Glucose, Bld 152 (*)    Calcium 7.7 (*)    All other components within normal limits  CBC WITH DIFFERENTIAL/PLATELET  PROTIME-INR  TYPE AND SCREEN  ABO/RH    EKG EKG Interpretation  Date/Time:  Thursday Dec 26 2022 00:28:51 EDT Ventricular Rate:  134 PR Interval:    QRS Duration: 88 QT Interval:  339 QTC Calculation: 483 R Axis:   62 Text Interpretation: Atrial fibrillation Anterior infarct, age indeterminate Abnormal ekg Confirmed by Zadie Rhine ([id number]) on 12/26/2022 12:32:53 AM  Radiology DG Knee Complete 4 Views Left  Result Date: 12/26/2022 CLINICAL DATA:  Recent fall with left knee pain, initial  encounter EXAM: LEFT KNEE - COMPLETE 4+ VIEW COMPARISON:  None Available. FINDINGS: No evidence of fracture, dislocation, or joint effusion. No evidence of arthropathy or other focal bone abnormality. Soft tissues are unremarkable. IMPRESSION: No acute abnormality noted. Electronically Signed   By: Alcide Clever M.D.   On: 12/26/2022 01:27   DG Hip Unilat With Pelvis 2-3 Views Left  Result Date: 12/26/2022 CLINICAL DATA:  Recent fall with hip pain, initial encounter EXAM: DG HIP (WITH OR WITHOUT PELVIS) 3V LEFT COMPARISON:  None Available. FINDINGS: Pelvic ring shows fracture through the inferior pubic ramus on the left as well as extending through the pubic symphysis medially. Proximal femurs are well seated. No acute femoral fracture is noted. No soft tissue changes are seen. IMPRESSION: Fractures involving the inferior pubic  ramus on the left and extending superiorly through the pubic symphysis. Electronically Signed   By: Alcide Clever M.D.   On: 12/26/2022 01:27   DG Chest Port 1 View  Result Date: 12/26/2022 CLINICAL DATA:  Status post fall.  Pain. EXAM: PORTABLE CHEST 1 VIEW COMPARISON:  Radiographs 07/08/2022 FINDINGS: Stable cardiomediastinal silhouette. Aortic atherosclerotic calcification. Chronic bronchitic changes. No focal consolidation, pleural effusion, or pneumothorax. The previous nodular opacity in the right lower lobe is no longer visualized. Demineralization. No definite displaced rib fractures. Remote right fourth posterior rib fracture. IMPRESSION: No acute cardiopulmonary process. Electronically Signed   By: Minerva Fester M.D.   On: 12/26/2022 01:23    Procedures Procedures    Medications Ordered in ED Medications  fentaNYL (SUBLIMAZE) injection 50 mcg (50 mcg Intravenous Given 12/26/22 0207)  dorzolamide (TRUSOPT) 2 % ophthalmic solution 1 drop (has no administration in time range)  latanoprost (XALATAN) 0.005 % ophthalmic solution 1 drop (has no administration in time range)   fentaNYL (SUBLIMAZE) injection 50 mcg (has no administration in time range)  iohexol (OMNIPAQUE) 300 MG/ML solution 80 mL (has no administration in time range)  acetaminophen (TYLENOL) tablet 1,000 mg (has no administration in time range)    ED Course/ Medical Decision Making/ A&P Clinical Course as of 12/26/22 0241  Thu Dec 26, 2022  0200 Patient presents after mechanical fall injuring her left hip.  Initial x-ray reveals pubic ramus fracture but no obvious hip fracture.  Will obtain CT imaging to further evaluate the fracture.  After that she will need to be admitted. No other signs of acute traumatic injury. Patient is requesting her eyedrops [DW]  0240 Endorsed to Dr. Imogene Burn with CT imaging pending HR is improved at this time Pt/family updated on plan  [DW]    Clinical Course User Index [DW] Zadie Rhine, MD                             Medical Decision Making Amount and/or Complexity of Data Reviewed Labs: ordered. Radiology: ordered.  Risk Prescription drug management. Decision regarding hospitalization.   This patient presents to the ED for concern of hip pain, this involves an extensive number of treatment options, and is a complaint that carries with it a high risk of complications and morbidity.  The differential diagnosis includes but is not limited to hip fracture, dislocated hip, muscle strain, lumbar spinal fracture  Comorbidities that complicate the patient evaluation: Patient's presentation is complicated by their history of atrial fibrillation, CVA  Social Determinants of Health: Patient's  lives alone   increases the complexity of managing their presentation  Additional history obtained: Additional history obtained from family Records reviewed previous admission documents  Lab Tests: I Ordered, and personally interpreted labs.  The pertinent results include:  mild hypokalemia  Imaging Studies ordered: I ordered imaging studies including X-ray hip,  chest and knee   I independently visualized and interpreted imaging which showed pubic ramus fracture I agree with the radiologist interpretation  Cardiac Monitoring: The patient was maintained on a cardiac monitor.  I personally viewed and interpreted the cardiac monitor which showed an underlying rhythm of:  Atrial Fibrillation  Medicines ordered and prescription drug management: I ordered medication including fentanyl for pain Reevaluation of the patient after these medicines showed that the patient    improved    Critical Interventions:  admit for pain management  Consultations Obtained: I requested consultation with the admitting physician [facility]  dr Imogene Burn , and discussed  findings as well as pertinent plan - they recommend: will admit, CT pelvis pending  Reevaluation: After the interventions noted above, I reevaluated the patient and found that they have :improved  Complexity of problems addressed: Patient's presentation is most consistent with  acute presentation with potential threat to life or bodily function  Disposition: After consideration of the diagnostic results and the patient's response to treatment,  I feel that the patent would benefit from admission   .    Patient without any signs of head trauma, denies headache, reports she only fell on her left hip.  Patient is on Eliquis, but last dose was yesterday morning Patient did request her eyedrops for her history of glaucoma       Final Clinical Impression(s) / ED Diagnoses Final diagnoses:  Closed fracture of ramus of left pubis, initial encounter [facility])    Rx / DC Orders ED Discharge Orders     None         Zadie Rhine, MD 12/26/22 [phone]

## 2022-12-26 NOTE — [place] FL2 (Signed)
[place] MEDICAID FL2 LEVEL OF CARE FORM     IDENTIFICATION  Patient Name: Kristen Whitehead Birthdate: Aug 21, 1922 Sex: female Admission Date (Current Location): 12/26/2022  [facility] and [place] Number:  Producer, television/film/video and Address:  [facility],  [address] [place], [place] [address]      Provider Number: [id number]  Attending Physician Name and Address:  [name], MD  Relative Name and Phone Number:  Mitzi Katz(dtr)336 [phone]    Current Level of Care: Hospital Recommended Level of Care: Skilled Nursing Facility Prior Approval Number:    Date Approved/Denied:   PASRR Number: [id number]  Discharge Plan: SNF    Current Diagnoses: Patient Active Problem List   Diagnosis Date Noted   Inferior pubic ramus fracture, left, closed, initial encounter (HCC) 12/26/2022   Hypokalemia 12/26/2022   DNR (do not resuscitate)/DNI(Do Not Intubate) 12/26/2022   Abnormal MRI scan, bone 11/21/2022   Basilar artery thrombosis 07/08/2022   Chronic diastolic heart failure (HCC) 12/11/2019   Localized swelling, mass and lump, upper limb 05/06/2019   Pain in joint of left elbow 05/06/2019   Malignant tumor of thyroid gland (HCC) 09/14/2018   Chronic anticoagulation 11/18/2016   Weakness 11/10/2014   Dyspnea 02/01/2014   Encounter for therapeutic drug monitoring 09/16/2013   Fatigue 04/08/2011   Essential hypertension 02/11/2011   Mitral regurgitation 02/11/2011   Hypothyroidism 02/11/2011   Hypercholesterolemia 02/11/2011   Atrial fibrillation (HCC) 12/03/2010    Orientation RESPIRATION BLADDER Height & Weight     Self, Time, Situation, Place  Normal Continent Weight: 51.6 kg Height:  4\' 11"  (149.9 cm)  BEHAVIORAL SYMPTOMS/MOOD NEUROLOGICAL BOWEL NUTRITION STATUS      Continent Diet (Regular)  AMBULATORY STATUS COMMUNICATION OF NEEDS Skin   Limited Assist Verbally Normal                       Personal Care Assistance Level of Assistance  Bathing,  Feeding, Dressing Bathing Assistance: Limited assistance Feeding assistance: Limited assistance Dressing Assistance: Limited assistance     Functional Limitations Info  Sight, Hearing, Speech Sight Info: Impaired (eyeglasses) Hearing Info: Adequate Speech Info: Adequate    SPECIAL CARE FACTORS FREQUENCY  PT (By licensed PT), OT (By licensed OT)     PT Frequency: 5x week OT Frequency: 5x week            Contractures Contractures Info: Not present    Additional Factors Info  Code Status, Allergies Code Status Info: DNR Allergies Info: Azithromycin, Compazine (Prochlorperazine Edisylate), Demerol, Dolophine (Methadone), Ebastine, Erythromycin, Gluten Meal, Ibuprofen, Meperidine Hcl, Morphine Sulfate, Statins, Tetanus Toxoids, Tetracyclines & Related           Current Medications (12/26/2022):  This is the current hospital active medication list Current Facility-Administered Medications  Medication Dose Route Frequency Provider Last Rate Last Admin   acetaminophen (TYLENOL) tablet 650 mg  650 mg Oral Q4H Carollee Herter, DO   650 mg at 12/26/22 1207   apixaban (ELIQUIS) tablet 2.5 mg  2.5 mg Oral BID Carollee Herter, DO   2.5 mg at 12/26/22 0435   atenolol (TENORMIN) tablet 25 mg  25 mg Oral BID Carollee Herter, DO   25 mg at 12/26/22 0435   dorzolamide (TRUSOPT) 2 % ophthalmic solution 1 drop  1 drop Left Eye BID Carollee Herter, DO   1 drop at 12/26/22 0859   ezetimibe (ZETIA) tablet 10 mg  10 mg Oral Daily Carollee Herter, DO   10  mg at 12/26/22 0855   fentaNYL (SUBLIMAZE) injection 50 mcg  50 mcg Intravenous Q30 min PRN Carollee Herter, DO   50 mcg at 12/26/22 0207   fentaNYL (SUBLIMAZE) injection 50 mcg  50 mcg Intravenous Once Carollee Herter, DO       furosemide (LASIX) tablet 40 mg  40 mg Oral Daily Carollee Herter, DO   40 mg at 12/26/22 1208   [START ON 12/27/2022] latanoprost (XALATAN) 0.005 % ophthalmic solution 1 drop  1 drop Both Eyes QHS Carollee Herter, DO       [START ON 12/27/2022] levothyroxine  (SYNTHROID) tablet 50 mcg  50 mcg Oral Q0600 Carollee Herter, DO       ondansetron [facility]) tablet 4 mg  4 mg Oral Q6H PRN Carollee Herter, DO       Or   ondansetron [facility]) injection 4 mg  4 mg Intravenous Q6H PRN Carollee Herter, DO       oxyCODONE (Oxy IR/ROXICODONE) immediate release tablet 2.5 mg  2.5 mg Oral Q4H PRN Carollee Herter, DO   2.5 mg at 12/26/22 1448   potassium chloride SA (KLOR-CON M) CR tablet 40 mEq  40 mEq Oral BID [name], MD   40 mEq at 12/26/22 1207   pramipexole (MIRAPEX) tablet 0.125 mg  0.125 mg Oral QHS Carollee Herter, DO       temazepam (RESTORIL) capsule 15 mg  15 mg Oral QHS PRN Carollee Herter, DO         Discharge Medications: Please see discharge summary for a list of discharge medications.  Relevant Imaging Results:  Relevant Lab Results:   Additional Information SS#[id number]  Isair Inabinet, Olegario Messier, RN

## 2022-12-26 NOTE — Assessment & Plan Note (Signed)
Continue Eliquis 2.5mg bid.  

## 2022-12-26 NOTE — Assessment & Plan Note (Addendum)
Observation med/surg bed. WBAT. PT consult. Scheduled tylenol 975 mg qid. Judicious use of opiates given her advanced age. Oxycodone 2.5 mg q4h prn.

## 2022-12-26 NOTE — Care Management Obs Status (Signed)
MEDICARE OBSERVATION STATUS NOTIFICATION   Patient Details  Name: ARNELL PORATH MRN: [id number] Date of Birth: 08-Jan-1923   Medicare Observation Status Notification Given:  Yes    MahabirOlegario Messier, RN 12/26/2022, 2:57 PM

## 2022-12-26 NOTE — Progress Notes (Signed)
[facility]  PROGRESS NOTE  Kristen Whitehead:[id number] DOB: September 24, 1922 DOA: 12/26/2022 PCP: [name], NP   Brief HPI:   89 year old female with history of paroxysmal atrial fibrillation on Eliquis, hypertension, chronic diastolic CHF, hypothyroidism presents to ER after fall at home.  Patient was in her dining room when she fell.  Patient states that her toe got caught on the leg of the chair and caused her to fall on her left hip.  Patient placed her life alert button.  She denies head trauma.  She was brought to the ED.  Patient usually lives with her granddaughter in [place] for about 3 weeks and then comes home to [place] for about a week.  She has been doing this since her stroke last year.  Today was the first day she has been home in [place] for the last 3 weeks.   X-ray of hip showed inferior pubic ramus fracture on the left, extending into the pubic symphysis medially.  No femoral fractures were noted.   Subjective   Patient seen and examined, complains of pelvic pain.   Assessment/Plan:    Inferior pubic ramus fracture, left -Weightbearing as tolerated, PT consulted -Continue pain management with Tylenol, oxycodone -Patient to go to skilled nursing facility  Chronic diastolic heart failure -Continue Lasix 40 mg p.o. daily  Paroxysmal atrial fibrillation  -Continue atenolol 25 mg p.o. twice daily  -Continue chronic anticoagulation with apixaban  Hypothyroidism -Continue Synthroid  Hypertension -Continue atenolol  Hypokalemia -Potassium being replaced -Follow BMP in am's    Medications     acetaminophen  650 mg Oral Q4H   apixaban  2.5 mg Oral BID   atenolol  25 mg Oral BID   dorzolamide  1 drop Left Eye BID   ezetimibe  10 mg Oral Daily   fentaNYL (SUBLIMAZE) injection  50 mcg Intravenous Once   furosemide  40 mg Oral Daily   [START ON 12/27/2022] latanoprost  1 drop Both Eyes QHS   levothyroxine  50 mcg Oral Daily   potassium  chloride  40 mEq Oral Q4H   potassium chloride  40 mEq Oral BID   pramipexole  0.125 mg Oral QHS     Data Reviewed:   CBG:  No results for input(s): "GLUCAP" in the last 168 hours.  SpO2: 97 %    Vitals:   12/26/22 0427 12/26/22 0429 12/26/22 0435 12/26/22 0819  BP:  117/73  101/63  Pulse:  (!) 57 (!) 145 67  Resp:  19  16  Temp:  97.8 F (36.6 C)  97.7 F (36.5 C)  TempSrc:  Oral  Oral  SpO2:  95%  97%  Weight:      Height: 4\' 11"  (1.499 m)         Data Reviewed:  Basic Metabolic Panel: Recent Labs  Lab 12/26/22 0040  NA 134*  K 2.8*  CL 98  CO2 26  GLUCOSE 152*  BUN 18  CREATININE 0.74  CALCIUM 7.7*    CBC: Recent Labs  Lab 12/26/22 0040  WBC 10.0  NEUTROABS 6.9  HGB 13.2  HCT 39.1  MCV 94.7  PLT 208    LFT No results for input(s): "AST", "ALT", "ALKPHOS", "BILITOT", "PROT", "ALBUMIN" in the last 168 hours.   Antibiotics: Anti-infectives (From admission, onward)    None        DVT prophylaxis: Eliquis  Code Status: DNR  Family Communication: No family at bedside   CONSULTS    Objective  Physical Examination:   General-appears in no acute distress Heart-S1-S2, regular, no murmur auscultated Lungs-clear to auscultation bilaterally, no wheezing or crackles auscultated Abdomen-soft, nontender, no organomegaly Extremities-no edema in the lower extremities Neuro-alert, oriented x3, no focal deficit noted  Status is: Inpatient:             [name]   [facility] If 7PM-7AM, please contact night-coverage at [email or web address], Office  [phone]   12/26/2022, 8:52 AM  LOS: 0 days

## 2022-12-26 NOTE — TOC Initial Note (Addendum)
Transition of Care [facility]) - Initial/Assessment Note    Patient Details  Name: Kristen Whitehead MRN: [id number] Date of Birth: 30-May-1923  Transition of Care [facility]) CM/SW Contact:    Lanier Clam, RN Phone Number: 12/26/2022, 2:54 PM  Clinical Narrative: In agreement to ST SNF-will fax out-per dtr Mitzi-do not send to [facility] -3:48p await bed offers.                  Expected Discharge Plan: Skilled Nursing Facility Barriers to Discharge: Continued Medical Work up   Patient Goals and CMS Choice Patient states their goals for this hospitalization and ongoing recovery are:: Rehab   Choice offered to / list presented to : Adult Children [place] ownership interest in [facility].provided to:: Adult Children    Expected Discharge Plan and Services   Discharge Planning Services: CM Consult Post Acute Care Choice: Skilled Nursing Facility Living arrangements for the past 2 months: Single Family Home                                      Prior Living Arrangements/Services Living arrangements for the past 2 months: Single Family Home Lives with:: Self Patient language and need for interpreter reviewed:: Yes Do you feel safe going back to the place where you live?: Yes      Need for Family Participation in Patient Care: Yes (Comment) Care giver support system in place?: Yes (comment)   Criminal Activity/Legal Involvement Pertinent to Current Situation/Hospitalization: No - Comment as needed  Activities of Daily Living      Permission Sought/Granted Permission sought to share information with : Case Manager Permission granted to share information with : Yes, Verbal Permission Granted  Share Information with NAME: Case manager           Emotional Assessment Appearance:: Appears stated age Attitude/Demeanor/Rapport: Gracious Affect (typically observed): Accepting Orientation: : Oriented to Self, Oriented to Place, Oriented to  Time, Oriented to  Situation Alcohol / Substance Use: Not Applicable Psych Involvement: No (comment)  Admission diagnosis:  Inferior pubic ramus fracture, left, closed, initial encounter (HCC) [S32.592A] Closed fracture of ramus of left pubis, initial encounter [facility]) [S32.592A] Patient Active Problem List   Diagnosis Date Noted   Inferior pubic ramus fracture, left, closed, initial encounter (HCC) 12/26/2022   Hypokalemia 12/26/2022   DNR (do not resuscitate)/DNI(Do Not Intubate) 12/26/2022   Abnormal MRI scan, bone 11/21/2022   Basilar artery thrombosis 07/08/2022   Chronic diastolic heart failure (HCC) 12/11/2019   Localized swelling, mass and lump, upper limb 05/06/2019   Pain in joint of left elbow 05/06/2019   Malignant tumor of thyroid gland (HCC) 09/14/2018   Chronic anticoagulation 11/18/2016   Weakness 11/10/2014   Dyspnea 02/01/2014   Encounter for therapeutic drug monitoring 09/16/2013   Fatigue 04/08/2011   Essential hypertension 02/11/2011   Mitral regurgitation 02/11/2011   Hypothyroidism 02/11/2011   Hypercholesterolemia 02/11/2011   Atrial fibrillation (HCC) 12/03/2010   PCP:  [name], NP Pharmacy:   [facility] [phone] [address] - [place], [place] - [address] [place] [address] Phone: [phone] Fax: [phone]  [facility] - [place], [place] - [address] [place] [address] Phone: [phone] Fax: [phone]  [id number] - [place], [place] - [address] AT [facility] [phone] [place] [address] Phone: [phone] Fax: [phone]  [facility] - [place], [place] - [address] [place] [address] Phone: [phone] Fax: [phone]  [facility] [address] [place] [address] Phone: [phone] Fax: [phone]  [facility] STORE  #[id number] [place], [place] - [address] AT [facility] [address] [place] [address] Phone: [phone] Fax: [phone]  [facility] [phone] - [place], [place] - [address] AT [facility] [address] [place] [address] Phone: [phone] Fax: [phone]     Social Determinants of Health (SDOH) Social History: SDOH Screenings   Food Insecurity: No Food Insecurity (05/06/2022)  Housing: Low Risk  (03/28/2022)  Transportation Needs: No Transportation Needs (05/06/2022)  Utilities: Not At Risk (05/06/2022)  Alcohol Screen: Low Risk  (05/06/2022)  Depression (PHQ2-9): Low Risk  (09/30/2022)  Financial Resource Strain: Low Risk  (05/06/2022)  Physical Activity: Insufficiently Active (05/06/2022)  Social Connections: Moderately Integrated (05/06/2022)  Stress: No Stress Concern Present (05/06/2022)  Tobacco Use: Low Risk  (12/26/2022)   SDOH Interventions:     Readmission Risk Interventions     No data to display

## 2022-12-26 NOTE — Assessment & Plan Note (Signed)
Verified pt's DNR/DNI status. Witnessed by her son-in-law Annette Stable.  Pt would like to fill out DNR form and HCPOA forms while in the hospital. Please consult chaplain to assist.

## 2022-12-26 NOTE — Assessment & Plan Note (Addendum)
Stable. Continue atenolol 25 mg bid

## 2022-12-26 NOTE — Evaluation (Signed)
Physical Therapy Evaluation Patient Details Name: Kristen Whitehead MRN: [id number] DOB: 06-20-1923 Today's Date: 12/26/2022  History of Present Illness  89 yo WF with hx of PAF, on Elquis, CVA, HTN, chronic diastolic CHF, hypothyroidism presents to ER after a fall at home.  Pt sustained a left closed Inferior pubic ramus fracture  Clinical Impression  Pt admitted with above diagnosis.  Pt currently with functional limitations due to the deficits listed below (see PT Problem List). Pt will benefit from acute skilled PT to increase their independence and safety with mobility to allow discharge.  Pt reporting cramps in bil LEs (RN notified).  Pt able to sit EOB and perform sit to stand.  Pt then encouraged OOB to recliner which pt requiring mod assist due to pain, decreased balance, and weakness.  Pt also reports dizziness with mobility: vitals: 117/99 mmHg, 97% room air, 53 bpm once in recliner.  Recommend post acute rehab upon d/c.        Recommendations for follow up therapy are one component of a multi-disciplinary discharge planning process, led by the attending physician.  Recommendations may be updated based on patient status, additional functional criteria and insurance authorization.  Follow Up Recommendations Can patient physically be transported by private vehicle: No     Assistance Recommended at Discharge Frequent or constant Supervision/Assistance  Patient can return home with the following  A lot of help with walking and/or transfers;A lot of help with bathing/dressing/bathroom    Equipment Recommendations None recommended by PT  Recommendations for Other Services       Functional Status Assessment Patient has had a recent decline in their functional status and demonstrates the ability to make significant improvements in function in a reasonable and predictable amount of time.     Precautions / Restrictions Precautions Precautions: Fall Restrictions Other  Position/Activity Restrictions: WBAT      Mobility  Bed Mobility Overal bed mobility: Needs Assistance Bed Mobility: Supine to Sit     Supine to sit: Max assist     General bed mobility comments: assist for LEs over EOB and trunk upright, pt held onto bed rail to self assist as able    Transfers Overall transfer level: Needs assistance Equipment used: Rolling walker (2 wheels) Transfers: Sit to/from Stand, Bed to chair/wheelchair/BSC Sit to Stand: Mod assist Stand pivot transfers: Mod assist         General transfer comment: verbal cues for hand placement, weight shifting, pushing through UEs to self support, cues for pivoting (turning heels) as pt felt unable to take steps; assist for weakness and pain    Ambulation/Gait                  Stairs            Wheelchair Mobility    Modified Rankin (Stroke Patients Only)       Balance Overall balance assessment: History of Falls                                           Pertinent Vitals/Pain Pain Assessment Pain Assessment: Faces Faces Pain Scale: Hurts even more Pain Location: bil legs "cramping" Pain Descriptors / Indicators: Cramping Pain Intervention(s): Repositioned, Premedicated before session, Monitored during session (per RN, pt had pain meds, notified of pt's cramping pain)    Home Living Family/patient expects to be discharged to:: Private residence Living Arrangements:  Alone Available Help at Discharge: Family;Available PRN/intermittently Type of Home: House Home Access: Stairs to enter Entrance Stairs-Rails: None Entrance Stairs-Number of Steps: 1   Home Layout: One level Home Equipment: Agricultural consultant (2 wheels);Cane - single point;Grab bars - tub/shower      Prior Function Prior Level of Function : Independent/Modified Independent;History of Falls (last six months)             Mobility Comments: furniture surfs inside home and uses RW outside the  home ADLs Comments: completes her own IADL/ADL     Hand Dominance        Extremity/Trunk Assessment   Upper Extremity Assessment Upper Extremity Assessment: Generalized weakness    Lower Extremity Assessment Lower Extremity Assessment: LLE deficits/detail LLE: Unable to fully assess due to pain       Communication   Communication: HOH  Cognition Arousal/Alertness: Awake/alert Behavior During Therapy: WFL for tasks assessed/performed Overall Cognitive Status: Within Functional Limits for tasks assessed                                          General Comments      Exercises     Assessment/Plan    PT Assessment Patient needs continued PT services  PT Problem List Decreased strength;Decreased activity tolerance;Decreased mobility;Pain;Decreased knowledge of use of DME;Decreased balance       PT Treatment Interventions DME instruction;Therapeutic exercise;Gait training;Stair training;Functional mobility training;Therapeutic activities;Patient/family education    PT Goals (Current goals can be found in the Care Plan section)  Acute Rehab PT Goals PT Goal Formulation: With patient Time For Goal Achievement: 01/09/23 Potential to Achieve Goals: Good    Frequency Min 1X/week     Co-evaluation               AM-PAC PT "6 Clicks" Mobility  Outcome Measure Help needed turning from your back to your side while in a flat bed without using bedrails?: A Lot Help needed moving from lying on your back to sitting on the side of a flat bed without using bedrails?: A Lot Help needed moving to and from a bed to a chair (including a wheelchair)?: A Lot Help needed standing up from a chair using your arms (e.g., wheelchair or bedside chair)?: A Lot Help needed to walk in hospital room?: Total Help needed climbing 3-5 steps with a railing? : Total 6 Click Score: 10    End of Session Equipment Utilized During Treatment: Gait belt Activity Tolerance:  Patient limited by pain Patient left: in chair;with call bell/phone within reach;with chair alarm set Nurse Communication: Mobility status PT Visit Diagnosis: Difficulty in walking, not elsewhere classified (R26.2)    Time: 1017-1050 PT Time Calculation (min) (ACUTE ONLY): 33 min   Charges:   PT Evaluation $PT Eval Low Complexity: 1 Low PT Treatments $Therapeutic Activity: 8-22 mins       Paulino Door, DPT Physical Therapist Acute Rehabilitation Services Office: [phone]   Janan Halter [place] 12/26/2022, 1:36 PM

## 2022-12-26 NOTE — Assessment & Plan Note (Addendum)
Stable. Continue lasix 40mg daily

## 2022-12-27 DIAGNOSIS — S32592A Other specified fracture of left pubis, initial encounter for closed fracture: Secondary | ICD-10-CM | POA: Diagnosis not present

## 2022-12-27 DIAGNOSIS — I10 Essential (primary) hypertension: Secondary | ICD-10-CM | POA: Diagnosis not present

## 2022-12-27 DIAGNOSIS — I4821 Permanent atrial fibrillation: Secondary | ICD-10-CM | POA: Diagnosis not present

## 2022-12-27 DIAGNOSIS — I5032 Chronic diastolic (congestive) heart failure: Secondary | ICD-10-CM | POA: Diagnosis not present

## 2022-12-27 DIAGNOSIS — Z7901 Long term (current) use of anticoagulants: Secondary | ICD-10-CM | POA: Diagnosis not present

## 2022-12-27 LAB — CBC
HCT: 29.3 % — ABNORMAL LOW (ref 36.0–46.0)
Hemoglobin: 9.7 g/dL — ABNORMAL LOW (ref 12.0–15.0)
MCH: 32.4 pg (ref 26.0–34.0)
MCHC: 33.1 g/dL (ref 30.0–36.0)
MCV: 98 fL (ref 80.0–100.0)
Platelets: 151 10*3/uL (ref 150–400)
RBC: 2.99 MIL/uL — ABNORMAL LOW (ref 3.87–5.11)
RDW: 15.3 % (ref 11.5–15.5)
WBC: 8.2 10*3/uL (ref 4.0–10.5)
nRBC: 0 % (ref 0.0–0.2)

## 2022-12-27 LAB — BASIC METABOLIC PANEL
Anion gap: 6 (ref 5–15)
BUN: 17 mg/dL (ref 8–23)
CO2: 26 mmol/L (ref 22–32)
Calcium: 7.1 mg/dL — ABNORMAL LOW (ref 8.9–10.3)
Chloride: 99 mmol/L (ref 98–111)
Creatinine, Ser: 0.95 mg/dL (ref 0.44–1.00)
GFR, Estimated: 54 mL/min — ABNORMAL LOW (ref >60)
Glucose, Bld: 106 mg/dL — ABNORMAL HIGH (ref 70–99)
Potassium: 5.1 mmol/L (ref 3.5–5.1)
Sodium: 131 mmol/L — ABNORMAL LOW (ref 135–145)

## 2022-12-27 MED ORDER — POLYVINYL ALCOHOL 1.4 % OP SOLN
1.0000 [drp] | OPHTHALMIC | Status: DC | PRN
  Filled 2022-12-27 (×2): qty 15

## 2022-12-27 MED ORDER — SODIUM CHLORIDE (HYPERTONIC) 5 % OP SOLN
1.0000 [drp] | Freq: Every day | OPHTHALMIC | Status: DC
  Administered 2022-12-27 – 2022-12-30 (×4): 1 [drp] via OPHTHALMIC

## 2022-12-27 MED ORDER — NETARSUDIL DIMESYLATE 0.02 % OP SOLN
1.0000 [drp] | Freq: Every morning | OPHTHALMIC | Status: DC
  Administered 2022-12-27 – 2022-12-31 (×5): 1 [drp] via OPHTHALMIC

## 2022-12-27 MED ORDER — METOCLOPRAMIDE HCL 10 MG PO TABS
5.0000 mg | ORAL_TABLET | Freq: Three times a day (TID) | ORAL | Status: DC
  Administered 2022-12-27 – 2022-12-28 (×5): 5 mg via ORAL
  Filled 2022-12-27 (×5): qty 1

## 2022-12-27 MED ORDER — PANTOPRAZOLE SODIUM 40 MG PO TBEC
40.0000 mg | DELAYED_RELEASE_TABLET | Freq: Every day | ORAL | Status: DC
  Administered 2022-12-27 – 2022-12-31 (×5): 40 mg via ORAL
  Filled 2022-12-27 (×5): qty 1

## 2022-12-27 NOTE — TOC Progression Note (Signed)
Transition of Care [facility]) - Progression Note    Patient Details  Name: Kristen Whitehead MRN: [id number] Date of Birth: Jan 23, 1923  Transition of Care [facility]) CM/SW Contact  Minh Jasper, Olegario Messier, RN Phone Number: 12/27/2022, 3:33 PM  Clinical Narrative:  Bed offers given to Mitzi(dtr)await choice prior auth.     Expected Discharge Plan: Skilled Nursing Facility Barriers to Discharge: Continued Medical Work up  Expected Discharge Plan and Services   Discharge Planning Services: CM Consult Post Acute Care Choice: Skilled Nursing Facility Living arrangements for the past 2 months: Single Family Home                                       Social Determinants of Health (SDOH) Interventions SDOH Screenings   Food Insecurity: No Food Insecurity (12/26/2022)  Housing: Low Risk  (12/26/2022)  Transportation Needs: No Transportation Needs (12/26/2022)  Utilities: Not At Risk (12/26/2022)  Alcohol Screen: Low Risk  (05/06/2022)  Depression (PHQ2-9): Low Risk  (09/30/2022)  Financial Resource Strain: Low Risk  (05/06/2022)  Physical Activity: Insufficiently Active (05/06/2022)  Social Connections: Moderately Integrated (05/06/2022)  Stress: No Stress Concern Present (05/06/2022)  Tobacco Use: Low Risk  (12/26/2022)    Readmission Risk Interventions     No data to display

## 2022-12-27 NOTE — Progress Notes (Addendum)
[facility]  PROGRESS NOTE  Kristen Whitehead ZOX:[id number] DOB: 06-22-1923 DOA: 12/26/2022 PCP: [name], NP   Brief HPI:   89 year old female with history of paroxysmal atrial fibrillation on Eliquis, hypertension, chronic diastolic CHF, hypothyroidism presents to ER after fall at home.  Patient was in her dining room when she fell.  Patient states that her toe got caught on the leg of the chair and caused her to fall on her left hip.  Patient placed her life alert button.  She denies head trauma.  She was brought to the ED.  Patient usually lives with her granddaughter in [place] for about 3 weeks and then comes home to [place] for about a week.  She has been doing this since her stroke last year.  Today was the first day she has been home in [place] for the last 3 weeks.   X-ray of hip showed inferior pubic ramus fracture on the left, extending into the pubic symphysis medially.  No femoral fractures were noted.   Subjective   Patient seen and examined, complains of vomiting whenever she eats food.  Started yesterday after patient was started on oxycodone for pain.   Assessment/Plan:    Inferior pubic ramus fracture, left -Weightbearing as tolerated, PT consulted -Continue pain management with Tylenol, oxycodone -Patient to go to skilled nursing facility -Continue oxycodone as needed for pain  Nausea and vomiting -Will schedule Reglan 5 mg 3 times daily and at bedtime with meals -Start Zofran as needed for nausea and vomiting -Start Protonix 40 mg p.o. daily  Chronic diastolic heart failure -Continue Lasix 40 mg p.o. daily  Paroxysmal atrial fibrillation  -Continue atenolol 25 mg p.o. twice daily  -Continue chronic anticoagulation with apixaban  Hypothyroidism -Continue Synthroid  Hypertension -Continue atenolol  Hypokalemia -Replete  Hyponatremia -Mild, 131 ;likely from poor p.o. intake -Started on full liquid diet as above -Follow sodium  level in a.m.   Medications     acetaminophen  650 mg Oral Q4H   apixaban  2.5 mg Oral BID   atenolol  25 mg Oral BID   dorzolamide  1 drop Left Eye BID   ezetimibe  10 mg Oral Daily   fentaNYL (SUBLIMAZE) injection  50 mcg Intravenous Once   furosemide  40 mg Oral Daily   latanoprost  1 drop Both Eyes QHS   levothyroxine  50 mcg Oral Q0600   pramipexole  0.125 mg Oral QHS     Data Reviewed:   CBG:  No results for input(s): "GLUCAP" in the last 168 hours.  SpO2: 93 %    Vitals:   12/26/22 1608 12/26/22 2042 12/27/22 0419 12/27/22 0500  BP: (!) 100/48 118/75 (!) 97/56   Pulse: 74 97 88   Resp: 16 17 16    Temp: 97.8 F (36.6 C) 98.7 F (37.1 C) 98.2 F (36.8 C)   TempSrc: Oral Oral Oral   SpO2: 94% 93% 93%   Weight:    54.6 kg  Height:          Data Reviewed:  Basic Metabolic Panel: Recent Labs  Lab 12/26/22 0040 12/27/22 0537  NA 134* 131*  K 2.8* 5.1  CL 98 99  CO2 26 26  GLUCOSE 152* 106*  BUN 18 17  CREATININE 0.74 0.95  CALCIUM 7.7* 7.1*    CBC: Recent Labs  Lab 12/26/22 0040 12/27/22 0651  WBC 10.0 8.2  NEUTROABS 6.9  --   HGB 13.2 9.7*  HCT 39.1 29.3*  MCV  94.7 98.0  PLT 208 151    LFT No results for input(s): "AST", "ALT", "ALKPHOS", "BILITOT", "PROT", "ALBUMIN" in the last 168 hours.   Antibiotics: Anti-infectives (From admission, onward)    None        DVT prophylaxis: Eliquis  Code Status: DNR  Family Communication: Discussed with family member at bedside   CONSULTS    Objective    Physical Examination:  General-appears in no acute distress Heart-S1-S2, regular, no murmur auscultated Lungs-clear to auscultation bilaterally, no wheezing or crackles auscultated Abdomen-soft, nontender, no organomegaly Extremities-no edema in the lower extremities Neuro-alert, oriented x3, no focal deficit noted  Status is: Inpatient:             [name]   [facility] If 7PM-7AM, please contact  night-coverage at [email or web address], Office  [phone]   12/27/2022, 8:53 AM  LOS: 0 days

## 2022-12-27 NOTE — Progress Notes (Signed)
Pt care assumed from previous RN at 1530 this shift. I have reviewed the previous RN's assessment and agree with their findings. Continue plan of care.

## 2022-12-27 NOTE — Congregational Nurse Program (Signed)
I have reviewed the previous RN's assessment and agree with their findings. Continue plan of care.

## 2022-12-28 DIAGNOSIS — I10 Essential (primary) hypertension: Secondary | ICD-10-CM | POA: Diagnosis not present

## 2022-12-28 DIAGNOSIS — S32592A Other specified fracture of left pubis, initial encounter for closed fracture: Secondary | ICD-10-CM | POA: Diagnosis not present

## 2022-12-28 LAB — BASIC METABOLIC PANEL
Anion gap: 7 (ref 5–15)
BUN: 19 mg/dL (ref 8–23)
CO2: 24 mmol/L (ref 22–32)
Calcium: 7 mg/dL — ABNORMAL LOW (ref 8.9–10.3)
Chloride: 102 mmol/L (ref 98–111)
Creatinine, Ser: 0.95 mg/dL (ref 0.44–1.00)
GFR, Estimated: 54 mL/min — ABNORMAL LOW (ref >60)
Glucose, Bld: 110 mg/dL — ABNORMAL HIGH (ref 70–99)
Potassium: 4.2 mmol/L (ref 3.5–5.1)
Sodium: 133 mmol/L — ABNORMAL LOW (ref 135–145)

## 2022-12-28 MED ORDER — METOCLOPRAMIDE HCL 10 MG PO TABS: 5.0000 mg | ORAL_TABLET | Freq: Three times a day (TID) | ORAL | Status: DC | PRN

## 2022-12-28 NOTE — Progress Notes (Signed)
[facility]  PROGRESS NOTE  Kristen Whitehead ZOX:[id number] DOB: November 28, 1922 DOA: 12/26/2022 PCP: [name], NP   Brief HPI:   89 year old female with history of paroxysmal atrial fibrillation on Eliquis, hypertension, chronic diastolic CHF, hypothyroidism presents to ER after fall at home.  Patient was in her dining room when she fell.  Patient states that her toe got caught on the leg of the chair and caused her to fall on her left hip.  Patient placed her life alert button.  She denies head trauma.  She was brought to the ED.  Patient usually lives with her granddaughter in [place] for about 3 weeks and then comes home to [place] for about a week.  She has been doing this since her stroke last year.  Today was the first day she has been home in [place] for the last 3 weeks.   X-ray of hip showed inferior pubic ramus fracture on the left, extending into the pubic symphysis medially.  No femoral fractures were noted.   Subjective   Nausea has improved.  Complains of pain in the pelvis.   Assessment/Plan:    Inferior pubic ramus fracture, left -Weightbearing as tolerated, PT consulted -Continue pain management with Tylenol, oxycodone -Patient to go to skilled nursing facility -Continue oxycodone as needed for pain  Nausea and vomiting -Improved after starting scheduled Reglan 5 mg 3 times daily nightly -At this time will change Reglan to 5 mg every 8 hours as needed -Continue Zofran as needed for nausea and vomiting -Continue Protonix 40 mg p.o. daily  Chronic diastolic heart failure -Continue Lasix 40 mg p.o. daily  Paroxysmal atrial fibrillation  -Continue atenolol 25 mg p.o. twice daily  -Continue chronic anticoagulation with apixaban  Hypothyroidism -Continue Synthroid  Hypertension -Continue atenolol  Hypokalemia -Replete  Hyponatremia -Mild, 133 ;likely from poor p.o. intake -Started on full liquid diet as above -Follow sodium level in  a.m.   Medications     acetaminophen  650 mg Oral Q4H   apixaban  2.5 mg Oral BID   atenolol  25 mg Oral BID   dorzolamide  1 drop Left Eye BID   ezetimibe  10 mg Oral Daily   fentaNYL (SUBLIMAZE) injection  50 mcg Intravenous Once   furosemide  40 mg Oral Daily   latanoprost  1 drop Both Eyes QHS   levothyroxine  50 mcg Oral Q0600   Netarsudil Dimesylate  1 drop Right Eye q morning   pantoprazole  40 mg Oral Q1200   pramipexole  0.125 mg Oral QHS   sodium chloride  1 drop Right Eye QHS     Data Reviewed:   CBG:  No results for input(s): "GLUCAP" in the last 168 hours.  SpO2: 91 %    Vitals:   12/27/22 0500 12/27/22 1226 12/27/22 2057 12/28/22 0514  BP:  (!) 104/58 131/79 (!) 92/55  Pulse:  89 (!) 109 92  Resp:  18 17 17   Temp:  97.8 F (36.6 C) (!) 97.5 F (36.4 C) 98.3 F (36.8 C)  TempSrc:  Oral Oral Oral  SpO2:  93% 96% 91%  Weight: 54.6 kg   56.3 kg  Height:          Data Reviewed:  Basic Metabolic Panel: Recent Labs  Lab 12/26/22 0040 12/27/22 0537 12/28/22 0509  NA 134* 131* 133*  K 2.8* 5.1 4.2  CL 98 99 102  CO2 26 26 24   GLUCOSE 152* 106* 110*  BUN 18 17 19  CREATININE 0.74 0.95 0.95  CALCIUM 7.7* 7.1* 7.0*    CBC: Recent Labs  Lab 12/26/22 0040 12/27/22 0651  WBC 10.0 8.2  NEUTROABS 6.9  --   HGB 13.2 9.7*  HCT 39.1 29.3*  MCV 94.7 98.0  PLT 208 151    LFT No results for input(s): "AST", "ALT", "ALKPHOS", "BILITOT", "PROT", "ALBUMIN" in the last 168 hours.   Antibiotics: Anti-infectives (From admission, onward)    None        DVT prophylaxis: Eliquis  Code Status: DNR  Family Communication: Discussed with family member at bedside   CONSULTS    Objective    Physical Examination:  General-appears in no acute distress Heart-S1-S2, regular, no murmur auscultated Lungs-clear to auscultation bilaterally, no wheezing or crackles auscultated Abdomen-soft, nontender, no organomegaly Extremities-no edema in  the lower extremities Neuro-alert, oriented x3, no focal deficit noted  Status is: Inpatient:             [name]   [facility] If 7PM-7AM, please contact night-coverage at [email or web address], Office  [phone]   12/28/2022, 9:34 AM  LOS: 0 days

## 2022-12-28 NOTE — Plan of Care (Signed)
  Problem: Nutrition: Goal: Adequate nutrition will be maintained Outcome: Progressing   Problem: Coping: Goal: Level of anxiety will decrease Outcome: Progressing   

## 2022-12-29 DIAGNOSIS — S32592A Other specified fracture of left pubis, initial encounter for closed fracture: Secondary | ICD-10-CM | POA: Diagnosis not present

## 2022-12-29 DIAGNOSIS — I10 Essential (primary) hypertension: Secondary | ICD-10-CM | POA: Diagnosis not present

## 2022-12-29 DIAGNOSIS — K5900 Constipation, unspecified: Secondary | ICD-10-CM

## 2022-12-29 DIAGNOSIS — Z7901 Long term (current) use of anticoagulants: Secondary | ICD-10-CM | POA: Diagnosis not present

## 2022-12-29 DIAGNOSIS — I5032 Chronic diastolic (congestive) heart failure: Secondary | ICD-10-CM | POA: Diagnosis not present

## 2022-12-29 MED ORDER — ORAL CARE MOUTH RINSE: 15.0000 mL | OROMUCOSAL | Status: DC | PRN

## 2022-12-29 MED ORDER — BISACODYL 10 MG RE SUPP: 10.0000 mg | Freq: Every day | RECTAL | Status: DC | PRN

## 2022-12-29 MED ORDER — BISACODYL 5 MG PO TBEC
5.0000 mg | DELAYED_RELEASE_TABLET | Freq: Every day | ORAL | Status: DC | PRN
  Administered 2022-12-29: 5 mg via ORAL
  Filled 2022-12-29: qty 1

## 2022-12-29 NOTE — Progress Notes (Signed)
[facility]  PROGRESS NOTE  Kristen Whitehead WUJ:[id number] DOB: 03/28/1923 DOA: 12/26/2022 PCP: [name], NP   Brief HPI:   89 year old female with history of paroxysmal atrial fibrillation on Eliquis, hypertension, chronic diastolic CHF, hypothyroidism presents to ER after fall at home.  Patient was in her dining room when she fell.  Patient states that her toe got caught on the leg of the chair and caused her to fall on her left hip.  Patient placed her life alert button.  She denies head trauma.  She was brought to the ED.  Patient usually lives with her granddaughter in [place] for about 3 weeks and then comes home to [place] for about a week.  She has been doing this since her stroke last year.  Today was the first day she has been home in [place] for the last 3 weeks.   X-ray of hip showed inferior pubic ramus fracture on the left, extending into the pubic symphysis medially.  No femoral fractures were noted.    Assessment/Plan:    Inferior pubic ramus fracture, left -Weightbearing as tolerated, PT consulted -Continue pain management with Tylenol, oxycodone -Patient to go to skilled nursing facility -Continue oxycodone as needed for pain  Nausea and vomiting -Improved after starting scheduled Reglan 5 mg 3 times daily nightly -At this time will change Reglan to 5 mg every 8 hours as needed -Continue Zofran as needed for nausea and vomiting -Continue Protonix 40 mg p.o. daily -Change diet to soft diet  Chronic diastolic heart failure -Continue Lasix 40 mg p.o. daily  Paroxysmal atrial fibrillation  -Continue atenolol 25 mg p.o. twice daily  -Continue chronic anticoagulation with apixaban  Hypothyroidism -Continue Synthroid  Hypertension -Continue atenolol  Hypokalemia -Replete  Hyponatremia -Mild, 133 ;likely from poor p.o. intake -Started on full liquid diet as above -Follow sodium level in a.m.  Constipation -Start Dulcolax suppository 10  mg as needed   Medications     acetaminophen  650 mg Oral Q4H   apixaban  2.5 mg Oral BID   atenolol  25 mg Oral BID   dorzolamide  1 drop Left Eye BID   ezetimibe  10 mg Oral Daily   fentaNYL (SUBLIMAZE) injection  50 mcg Intravenous Once   furosemide  40 mg Oral Daily   latanoprost  1 drop Both Eyes QHS   levothyroxine  50 mcg Oral Q0600   Netarsudil Dimesylate  1 drop Right Eye q morning   pantoprazole  40 mg Oral Q1200   pramipexole  0.125 mg Oral QHS   sodium chloride  1 drop Right Eye QHS     Data Reviewed:   CBG:  No results for input(s): "GLUCAP" in the last 168 hours.  SpO2: 93 %    Vitals:   12/28/22 1255 12/28/22 2043 12/29/22 0458 12/29/22 0553  BP: (!) 105/53 119/83  (!) 101/59  Pulse: 92 (!) 103    Resp:  18  18  Temp: (!) 97.5 F (36.4 C) 98.3 F (36.8 C)  98.2 F (36.8 C)  TempSrc: Oral Oral  Oral  SpO2: 95% 95%  93%  Weight:   55.3 kg   Height:          Data Reviewed:  Basic Metabolic Panel: Recent Labs  Lab 12/26/22 0040 12/27/22 0537 12/28/22 0509  NA 134* 131* 133*  K 2.8* 5.1 4.2  CL 98 99 102  CO2 26 26 24   GLUCOSE 152* 106* 110*  BUN 18 17 19  CREATININE 0.74 0.95 0.95  CALCIUM 7.7* 7.1* 7.0*    CBC: Recent Labs  Lab 12/26/22 0040 12/27/22 0651  WBC 10.0 8.2  NEUTROABS 6.9  --   HGB 13.2 9.7*  HCT 39.1 29.3*  MCV 94.7 98.0  PLT 208 151    LFT No results for input(s): "AST", "ALT", "ALKPHOS", "BILITOT", "PROT", "ALBUMIN" in the last 168 hours.   Antibiotics: Anti-infectives (From admission, onward)    None        DVT prophylaxis: Eliquis  Code Status: DNR  Family Communication: Discussed with family member at bedside   CONSULTS      Subjective   Patient seen and examined, complains of constipation.  Pain well-controlled.  Objective    Physical Examination:  General-appears in no acute distress Heart-S1-S2, regular, no murmur auscultated Lungs-clear to auscultation  bilaterally Abdomen-soft, nontender, no organomegaly Extremities-no edema in the lower extremities Neuro-alert, oriented x3, no focal deficit noted  Status is: Inpatient:             [name]   [facility] If 7PM-7AM, please contact night-coverage at [email or web address], Office  [phone]   12/29/2022, 9:24 AM  LOS: 0 days

## 2022-12-29 NOTE — Plan of Care (Signed)
  Problem: Health Behavior/Discharge Planning: Goal: Ability to manage health-related needs will improve Outcome: Progressing   

## 2022-12-30 DIAGNOSIS — I5032 Chronic diastolic (congestive) heart failure: Secondary | ICD-10-CM | POA: Diagnosis not present

## 2022-12-30 DIAGNOSIS — S32592A Other specified fracture of left pubis, initial encounter for closed fracture: Secondary | ICD-10-CM | POA: Diagnosis not present

## 2022-12-30 DIAGNOSIS — Z7901 Long term (current) use of anticoagulants: Secondary | ICD-10-CM | POA: Diagnosis not present

## 2022-12-30 DIAGNOSIS — I10 Essential (primary) hypertension: Secondary | ICD-10-CM | POA: Diagnosis not present

## 2022-12-30 MED ORDER — BISACODYL 10 MG RE SUPP
10.0000 mg | Freq: Once | RECTAL | Status: AC
  Administered 2022-12-30: 10 mg via RECTAL
  Filled 2022-12-30: qty 1

## 2022-12-30 NOTE — Progress Notes (Signed)
Physical Therapy Treatment Patient Details Name: Kristen Whitehead MRN: [id number] DOB: 1922-08-23 Today's Date: 12/30/2022   History of Present Illness 89 yo WF with hx of PAF, on Elquis, CVA, HTN, chronic diastolic CHF, hypothyroidism presents to ER after a fall at home.  Pt sustained a left closed Inferior pubic ramus fracture    PT Comments    Pt reported she's not had a BM in 8 days. Mod assist to transfer bed to bedside commode with RW. Pt became pale, dizzy, had dry heaving, HR 135-150 while sitting on bedside commode, RN called to room and she administered zofran. Pt did have a BM. +2 mod assist to pivot back to bed. Pt pleasant and puts forth good effort.     Recommendations for follow up therapy are one component of a multi-disciplinary discharge planning process, led by the attending physician.  Recommendations may be updated based on patient status, additional functional criteria and insurance authorization.  Follow Up Recommendations  Can patient physically be transported by private vehicle: No    Assistance Recommended at Discharge Frequent or constant Supervision/Assistance  Patient can return home with the following A lot of help with walking and/or transfers;A lot of help with bathing/dressing/bathroom;Assistance with cooking/housework;Assist for transportation;Help with stairs or ramp for entrance   Equipment Recommendations  None recommended by PT    Recommendations for Other Services       Precautions / Restrictions Precautions Precautions: Fall Precaution Comments: L pelvic fx, WBAT Restrictions Weight Bearing Restrictions: No Other Position/Activity Restrictions: WBAT     Mobility  Bed Mobility Overal bed mobility: Needs Assistance Bed Mobility: Sidelying to Sit, Sit to Sidelying   Sidelying to sit: Max assist     Sit to sidelying: Max assist General bed mobility comments: assist for LEs over EOB and trunk upright, pt held onto bed rail to self  assist as able    Transfers Overall transfer level: Needs assistance Equipment used: Rolling walker (2 wheels) Transfers: Sit to/from Stand, Bed to chair/wheelchair/BSC Sit to Stand: Mod assist Stand pivot transfers: Mod assist, +2 safety/equipment         General transfer comment: Pt reported she hasn't had a BM in 8 days. Performed SPT bed to bedside commode transfer wtih RW. verbal cues for hand placement, weight shifting, pushing through UEs to self support; Pt reported dizziness and had dry heaving once sitting on bedside commode, HR 135-150, RN notified and came in room and gave pt zofran. BP WNL. Pt did have a BM. Assisted pt back to bed with +2 mod assist for balance.    Ambulation/Gait                   Stairs             Wheelchair Mobility    Modified Rankin (Stroke Patients Only)       Balance Overall balance assessment: History of Falls                                          Cognition Arousal/Alertness: Awake/alert Behavior During Therapy: WFL for tasks assessed/performed Overall Cognitive Status: Within Functional Limits for tasks assessed  Exercises General Exercises - Lower Extremity Ankle Circles/Pumps: AROM, Both, 10 reps, Sidelying Long Arc Quad: AROM, Both, 10 reps, Seated    General Comments        Pertinent Vitals/Pain Pain Assessment Pain Score: 7  Pain Location: L hip Pain Descriptors / Indicators: Sore Pain Intervention(s): Limited activity within patient's tolerance, Monitored during session, Premedicated before session, Repositioned    Home Living                          Prior Function            PT Goals (current goals can now be found in the care plan section) Acute Rehab PT Goals PT Goal Formulation: With patient/family Time For Goal Achievement: 01/09/23 Potential to Achieve Goals: Good Progress towards PT goals:  Progressing toward goals    Frequency    Min 1X/week      PT Plan Current plan remains appropriate    Co-evaluation              AM-PAC PT "6 Clicks" Mobility   Outcome Measure  Help needed turning from your back to your side while in a flat bed without using bedrails?: A Lot Help needed moving from lying on your back to sitting on the side of a flat bed without using bedrails?: A Lot Help needed moving to and from a bed to a chair (including a wheelchair)?: A Lot Help needed standing up from a chair using your arms (e.g., wheelchair or bedside chair)?: A Lot Help needed to walk in hospital room?: Total Help needed climbing 3-5 steps with a railing? : Total 6 Click Score: 10    End of Session Equipment Utilized During Treatment: Gait belt Activity Tolerance: Patient limited by pain Patient left: in chair;with call bell/phone within reach;with chair alarm set Nurse Communication: Mobility status PT Visit Diagnosis: Difficulty in walking, not elsewhere classified (R26.2)     Time: 1132-1206 PT Time Calculation (min) (ACUTE ONLY): 34 min  Charges:  $Therapeutic Activity: 23-37 mins                     Ralene Bathe Kistler PT 12/30/2022  Acute Rehabilitation Services  Office [phone]

## 2022-12-30 NOTE — TOC Progression Note (Addendum)
Transition of Care [facility]) - Progression Note    Patient Details  Name: ALIVEAH HOWERY MRN: [id number] Date of Birth: 05-24-1923  [facility] CM/SW Contact  Naiara Lombardozzi, Olegario Messier, RN Phone Number: 12/30/2022, 12:08 PM  Clinical Narrative:  Mitzi(dtr) chose [facility]-will await PT note prior auth.  -2:51p-auth initiated-pendind Berkley Harvey [id number]#[id number] auth for [facility].    Expected Discharge Plan: Skilled Nursing Facility Barriers to Discharge: Continued Medical Work up  Expected Discharge Plan and Services   Discharge Planning Services: CM Consult Post Acute Care Choice: Skilled Nursing Facility Living arrangements for the past 2 months: Single Family Home                                       Social Determinants of Health (SDOH) Interventions SDOH Screenings   Food Insecurity: No Food Insecurity (12/26/2022)  Housing: Low Risk  (12/26/2022)  Transportation Needs: No Transportation Needs (12/26/2022)  Utilities: Not At Risk (12/26/2022)  Alcohol Screen: Low Risk  (05/06/2022)  Depression (PHQ2-9): Low Risk  (09/30/2022)  Financial Resource Strain: Low Risk  (05/06/2022)  Physical Activity: Insufficiently Active (05/06/2022)  Social Connections: Moderately Integrated (05/06/2022)  Stress: No Stress Concern Present (05/06/2022)  Tobacco Use: Low Risk  (12/26/2022)    Readmission Risk Interventions     No data to display

## 2022-12-30 NOTE — Progress Notes (Signed)
[facility]  PROGRESS NOTE  Kristen Whitehead ZOX:[id number] DOB: 04-30-1923 DOA: 12/26/2022 PCP: [name], NP   Brief HPI:   89 year old female with history of paroxysmal atrial fibrillation on Eliquis, hypertension, chronic diastolic CHF, hypothyroidism presents to ER after fall at home.  Patient was in her dining room when she fell.  Patient states that her toe got caught on the leg of the chair and caused her to fall on her left hip.  Patient placed her life alert button.  She denies head trauma.  She was brought to the ED.  Patient usually lives with her granddaughter in [place] for about 3 weeks and then comes home to [place] for about a week.  She has been doing this since her stroke last year.  Today was the first day she has been home in [place] for the last 3 weeks.   X-ray of hip showed inferior pubic ramus fracture on the left, extending into the pubic symphysis medially.  No femoral fractures were noted.    Assessment/Plan:    Inferior pubic ramus fracture, left -Weightbearing as tolerated, PT consulted -Continue pain management with Tylenol, oxycodone -Patient to go to skilled nursing facility -Continue oxycodone as needed for pain  Nausea and vomiting -Developed nausea and vomiting, improved with scheduled Reglan; now Reglan discontinued -Continue Zofran as needed, Protonix 40 mg daily   Chronic diastolic heart failure -Continue Lasix 40 mg p.o. daily  Paroxysmal atrial fibrillation  -Continue atenolol 25 mg p.o. twice daily  -Continue chronic anticoagulation with apixaban  Hypothyroidism -Continue Synthroid  Hypertension -Continue atenolol  Hypokalemia -Replete  Hyponatremia -Mild, 133 ;likely from poor p.o. intake -Started on full liquid diet as above -Follow sodium level in a.m.  Constipation -Dulcolax suppository 10 mg x 1   Medications     acetaminophen  650 mg Oral Q4H   apixaban  2.5 mg Oral BID   atenolol  25 mg Oral  BID   bisacodyl  10 mg Rectal Once   dorzolamide  1 drop Left Eye BID   ezetimibe  10 mg Oral Daily   fentaNYL (SUBLIMAZE) injection  50 mcg Intravenous Once   furosemide  40 mg Oral Daily   latanoprost  1 drop Both Eyes QHS   levothyroxine  50 mcg Oral Q0600   Netarsudil Dimesylate  1 drop Right Eye q morning   pantoprazole  40 mg Oral Q1200   pramipexole  0.125 mg Oral QHS   sodium chloride  1 drop Right Eye QHS     Data Reviewed:   CBG:  No results for input(s): "GLUCAP" in the last 168 hours.  SpO2: 93 %    Vitals:   12/29/22 1350 12/29/22 2216 12/30/22 0500 12/30/22 0514  BP: 117/72 111/68  131/74  Pulse: 94 98  91  Resp: 16 18  18   Temp: 98.9 F (37.2 C) 98.8 F (37.1 C)  98.5 F (36.9 C)  TempSrc: Oral Oral  Oral  SpO2: 95% 96%  93%  Weight:   58.4 kg   Height:          Data Reviewed:  Basic Metabolic Panel: Recent Labs  Lab 12/26/22 0040 12/27/22 0537 12/28/22 0509  NA 134* 131* 133*  K 2.8* 5.1 4.2  CL 98 99 102  CO2 26 26 24   GLUCOSE 152* 106* 110*  BUN 18 17 19   CREATININE 0.74 0.95 0.95  CALCIUM 7.7* 7.1* 7.0*    CBC: Recent Labs  Lab 12/26/22 0040 12/27/22 8119  WBC 10.0 8.2  NEUTROABS 6.9  --   HGB 13.2 9.7*  HCT 39.1 29.3*  MCV 94.7 98.0  PLT 208 151    LFT No results for input(s): "AST", "ALT", "ALKPHOS", "BILITOT", "PROT", "ALBUMIN" in the last 168 hours.   Antibiotics: Anti-infectives (From admission, onward)    None        DVT prophylaxis: Eliquis  Code Status: DNR  Family Communication: Discussed with family member at bedside   CONSULTS      Subjective   Complains of constipation.  Patient states that she has not moved her bowels in the last 7 to 8 days.  Denies nausea or vomiting.  Objective    Physical Examination:  General-appears in no acute distress Heart-S1-S2, regular, no murmur auscultated Lungs-clear to auscultation bilaterally, no wheezing or crackles auscultated Abdomen-soft,  nontender, no organomegaly Extremities-no edema in the lower extremities Neuro-alert, oriented x3, no focal deficit noted  Status is: Inpatient:             [name]   [facility] If 7PM-7AM, please contact night-coverage at [email or web address], Office  [phone]   12/30/2022, 8:30 AM  LOS: 0 days

## 2022-12-31 DIAGNOSIS — I5032 Chronic diastolic (congestive) heart failure: Secondary | ICD-10-CM | POA: Diagnosis not present

## 2022-12-31 DIAGNOSIS — Z79899 Other long term (current) drug therapy: Secondary | ICD-10-CM | POA: Diagnosis not present

## 2022-12-31 DIAGNOSIS — Z85850 Personal history of malignant neoplasm of thyroid: Secondary | ICD-10-CM | POA: Diagnosis not present

## 2022-12-31 DIAGNOSIS — M25552 Pain in left hip: Secondary | ICD-10-CM | POA: Diagnosis not present

## 2022-12-31 DIAGNOSIS — Z743 Need for continuous supervision: Secondary | ICD-10-CM | POA: Diagnosis not present

## 2022-12-31 DIAGNOSIS — E785 Hyperlipidemia, unspecified: Secondary | ICD-10-CM | POA: Diagnosis not present

## 2022-12-31 DIAGNOSIS — D649 Anemia, unspecified: Secondary | ICD-10-CM | POA: Diagnosis not present

## 2022-12-31 DIAGNOSIS — R2681 Unsteadiness on feet: Secondary | ICD-10-CM | POA: Diagnosis not present

## 2022-12-31 DIAGNOSIS — Z4789 Encounter for other orthopedic aftercare: Secondary | ICD-10-CM | POA: Diagnosis not present

## 2022-12-31 DIAGNOSIS — S32502D Unspecified fracture of left pubis, subsequent encounter for fracture with routine healing: Secondary | ICD-10-CM | POA: Diagnosis not present

## 2022-12-31 DIAGNOSIS — I10 Essential (primary) hypertension: Secondary | ICD-10-CM | POA: Diagnosis not present

## 2022-12-31 DIAGNOSIS — I69398 Other sequelae of cerebral infarction: Secondary | ICD-10-CM | POA: Diagnosis not present

## 2022-12-31 DIAGNOSIS — R0902 Hypoxemia: Secondary | ICD-10-CM | POA: Diagnosis not present

## 2022-12-31 DIAGNOSIS — Z7409 Other reduced mobility: Secondary | ICD-10-CM | POA: Diagnosis not present

## 2022-12-31 DIAGNOSIS — R2689 Other abnormalities of gait and mobility: Secondary | ICD-10-CM | POA: Diagnosis not present

## 2022-12-31 DIAGNOSIS — R079 Chest pain, unspecified: Secondary | ICD-10-CM | POA: Diagnosis not present

## 2022-12-31 DIAGNOSIS — I110 Hypertensive heart disease with heart failure: Secondary | ICD-10-CM | POA: Diagnosis not present

## 2022-12-31 DIAGNOSIS — H04123 Dry eye syndrome of bilateral lacrimal glands: Secondary | ICD-10-CM | POA: Diagnosis not present

## 2022-12-31 DIAGNOSIS — Z8673 Personal history of transient ischemic attack (TIA), and cerebral infarction without residual deficits: Secondary | ICD-10-CM | POA: Diagnosis not present

## 2022-12-31 DIAGNOSIS — I2510 Atherosclerotic heart disease of native coronary artery without angina pectoris: Secondary | ICD-10-CM | POA: Diagnosis not present

## 2022-12-31 DIAGNOSIS — I4821 Permanent atrial fibrillation: Secondary | ICD-10-CM | POA: Diagnosis not present

## 2022-12-31 DIAGNOSIS — E876 Hypokalemia: Secondary | ICD-10-CM | POA: Diagnosis not present

## 2022-12-31 DIAGNOSIS — E46 Unspecified protein-calorie malnutrition: Secondary | ICD-10-CM | POA: Diagnosis not present

## 2022-12-31 DIAGNOSIS — K219 Gastro-esophageal reflux disease without esophagitis: Secondary | ICD-10-CM | POA: Diagnosis not present

## 2022-12-31 DIAGNOSIS — W19XXXD Unspecified fall, subsequent encounter: Secondary | ICD-10-CM | POA: Diagnosis not present

## 2022-12-31 DIAGNOSIS — Z7901 Long term (current) use of anticoagulants: Secondary | ICD-10-CM | POA: Diagnosis not present

## 2022-12-31 DIAGNOSIS — Z7401 Bed confinement status: Secondary | ICD-10-CM | POA: Diagnosis not present

## 2022-12-31 DIAGNOSIS — R251 Tremor, unspecified: Secondary | ICD-10-CM | POA: Diagnosis not present

## 2022-12-31 DIAGNOSIS — M6281 Muscle weakness (generalized): Secondary | ICD-10-CM | POA: Diagnosis not present

## 2022-12-31 DIAGNOSIS — K5900 Constipation, unspecified: Secondary | ICD-10-CM | POA: Diagnosis not present

## 2022-12-31 DIAGNOSIS — H409 Unspecified glaucoma: Secondary | ICD-10-CM | POA: Diagnosis not present

## 2022-12-31 DIAGNOSIS — E871 Hypo-osmolality and hyponatremia: Secondary | ICD-10-CM | POA: Diagnosis not present

## 2022-12-31 DIAGNOSIS — Z9181 History of falling: Secondary | ICD-10-CM | POA: Diagnosis not present

## 2022-12-31 DIAGNOSIS — S32592A Other specified fracture of left pubis, initial encounter for closed fracture: Secondary | ICD-10-CM | POA: Diagnosis not present

## 2022-12-31 DIAGNOSIS — E039 Hypothyroidism, unspecified: Secondary | ICD-10-CM | POA: Diagnosis not present

## 2022-12-31 DIAGNOSIS — I480 Paroxysmal atrial fibrillation: Secondary | ICD-10-CM | POA: Diagnosis not present

## 2022-12-31 DIAGNOSIS — S32512A Fracture of superior rim of left pubis, initial encounter for closed fracture: Secondary | ICD-10-CM | POA: Diagnosis not present

## 2022-12-31 MED ORDER — OXYCODONE HCL 5 MG PO TABS: 2.5000 mg | ORAL_TABLET | Freq: Four times a day (QID) | ORAL | 0 refills | Status: DC | PRN

## 2022-12-31 MED ORDER — TEMAZEPAM 15 MG PO CAPS
15.0000 mg | ORAL_CAPSULE | Freq: Every day | ORAL | 0 refills | Status: DC
Start: 2022-12-31 — End: 2023-04-23

## 2022-12-31 MED ORDER — ALUM & MAG HYDROXIDE-SIMETH 200-200-20 MG/5ML PO SUSP
30.0000 mL | Freq: Once | ORAL | Status: AC
  Administered 2022-12-31: 30 mL via ORAL
  Filled 2022-12-31: qty 30

## 2022-12-31 MED ORDER — SENNOSIDES-DOCUSATE SODIUM 8.6-50 MG PO TABS
1.0000 | ORAL_TABLET | Freq: Two times a day (BID) | ORAL | Status: DC
  Administered 2022-12-31: 1 via ORAL
  Filled 2022-12-31: qty 1

## 2022-12-31 MED ORDER — SENNOSIDES-DOCUSATE SODIUM 8.6-50 MG PO TABS: 1.0000 | ORAL_TABLET | Freq: Every day | ORAL | Status: DC

## 2022-12-31 NOTE — TOC Progression Note (Addendum)
Transition of Care [facility]) - Progression Note    Patient Details  Name: Kristen Whitehead MRN: [id number] Date of Birth: [birth year]  Transition of Care [facility]) CM/SW Contact  Jobin Montelongo, Olegario Messier, RN Phone Number: 12/31/2022, 11:29 AM  Clinical Narrative:  Received [facility] eff till 5/15-plan [facility] WG#[id number];[facility] NG#[id number] for [facility] await if bed available & if medically stable. MD notified. -11:37a-[facility] rep [facility] bed available. Awaiting if medically stable today.MD notified.     Expected Discharge Plan: Skilled Nursing Facility Barriers to Discharge: Continued Medical Work up  Expected Discharge Plan and Services   Discharge Planning Services: CM Consult Post Acute Care Choice: Skilled Nursing Facility Living arrangements for the past 2 months: Single Family Home                                       Social Determinants of Health (SDOH) Interventions SDOH Screenings   Food Insecurity: No Food Insecurity (12/26/2022)  Housing: Low Risk  (12/26/2022)  Transportation Needs: No Transportation Needs (12/26/2022)  Utilities: Not At Risk (12/26/2022)  Alcohol Screen: Low Risk  (05/06/2022)  Depression (PHQ2-9): Low Risk  (09/30/2022)  Financial Resource Strain: Low Risk  (05/06/2022)  Physical Activity: Insufficiently Active (05/06/2022)  Social Connections: Moderately Integrated (05/06/2022)  Stress: No Stress Concern Present (05/06/2022)  Tobacco Use: Low Risk  (12/26/2022)    Readmission Risk Interventions     No data to display

## 2022-12-31 NOTE — TOC Transition Note (Addendum)
Transition of Care [facility]) - CM/SW Discharge Note   Patient Details  Name: Kristen Whitehead MRN: [id number] Date of Birth: July 28, 1923  Transition of Care [facility]) CM/SW Contact:  Lanier Clam, RN Phone Number: 12/31/2022, 12:49 PM   Clinical Narrative: d/c today [facility] Tr[facility] aware. Awaiting rm#,report tel# prior PTAR.   -1:25p-going to [facility] rm#[id number],report tel#[phone]. PTAR called.No further CM needs.   Final next level of care: Skilled Nursing Facility Barriers to Discharge: No Barriers Identified   Patient Goals and CMS Choice   Choice offered to / list presented to : Adult Children  Discharge Placement                         Discharge Plan and Services Additional resources added to the After Visit Summary for     Discharge Planning Services: CM Consult Post Acute Care Choice: Skilled Nursing Facility                               Social Determinants of Health (SDOH) Interventions SDOH Screenings   Food Insecurity: No Food Insecurity (12/26/2022)  Housing: Low Risk  (12/26/2022)  Transportation Needs: No Transportation Needs (12/26/2022)  Utilities: Not At Risk (12/26/2022)  Alcohol Screen: Low Risk  (05/06/2022)  Depression (PHQ2-9): Low Risk  (09/30/2022)  Financial Resource Strain: Low Risk  (05/06/2022)  Physical Activity: Insufficiently Active (05/06/2022)  Social Connections: Moderately Integrated (05/06/2022)  Stress: No Stress Concern Present (05/06/2022)  Tobacco Use: Low Risk  (12/26/2022)     Readmission Risk Interventions     No data to display

## 2022-12-31 NOTE — Discharge Summary (Signed)
Physician Discharge Summary   Patient: Kristen Whitehead MRN: [id number] DOB: April 01, 1923  Admit date:     12/26/2022  Discharge date: 12/31/22  Discharge Physician: [name]   PCP: [name], NP   Recommendations at discharge:      Discharge Diagnoses: Principal Problem:   Inferior pubic ramus fracture, left, closed, initial encounter [facility]) Active Problems:   Atrial fibrillation (HCC)   Essential hypertension   Hypothyroidism   Chronic anticoagulation   Chronic diastolic heart failure (HCC)   Hypokalemia   DNR (do not resuscitate)/DNI(Do Not Intubate)  Resolved Problems:   * No resolved hospital problems. *  Hospital Course:  89 year old female with history of paroxysmal atrial fibrillation on Eliquis, hypertension, chronic diastolic CHF, hypothyroidism presents to ER after fall at home.  Patient was in her dining room when she fell.  Patient states that her toe got caught on the leg of the chair and caused her to fall on her left hip.  Patient placed her life alert button.  She denies head trauma.  She was brought to the ED.  Patient usually lives with her granddaughter in [place] for about 3 weeks and then comes home to [place] for about a week.  She has been doing this since her stroke last year.  Today was the first day she has been home in [place] for the last 3 weeks.    X-ray of hip showed inferior pubic ramus fracture on the left, extending into the pubic symphysis medially.  No femoral fractures were noted.  Assessment and Plan:   Inferior pubic ramus fracture, left -Weightbearing as tolerated, PT consulted -Continue pain management with Tylenol, oxycodone -Patient to go to skilled nursing facility -Continue oxycodone as needed for pain   Nausea and vomiting -Developed nausea and vomiting, improved with scheduled Reglan; now Reglan discontinued -Continue Protonix 40 mg daily     Chronic diastolic heart failure -Continue Lasix 40 mg p.o.  daily   Paroxysmal atrial fibrillation  -Continue atenolol 25 mg p.o. twice daily  -Continue chronic anticoagulation with apixaban   Hypothyroidism -Continue Synthroid   Hypertension -Continue atenolol   Hypokalemia -Replete   Hyponatremia -Mild, 133 ;likely from poor p.o. intake    Constipation -Resolved with Senokot-S tablets -Continue Senokot tablet daily        Consultants:  Procedures performed:  Disposition: Skilled nursing facility Diet recommendation:  Discharge Diet Orders (From admission, onward)     Start     Ordered   12/31/22 0000  Diet - low sodium heart healthy        12/31/22 1154           Regular diet DISCHARGE MEDICATION: Allergies as of 12/31/2022       Reactions   Azithromycin Nausea Only   Compazine [prochlorperazine Edisylate] Nausea Only   Demerol Nausea Only   Dolophine [methadone] Nausea Only   Ebastine Nausea Only   EBS   Erythromycin Nausea Only   Gluten Meal Diarrhea   Ibuprofen Nausea Only   Meperidine Hcl Other (See Comments)   Unknown    Morphine Sulfate Itching   Statins Other (See Comments)   myalgias   Tetanus Toxoids Nausea Only   Tetracyclines & Related Nausea Only        Medication List     STOP taking these medications    amLODipine 2.5 MG tablet Commonly known as: NORVASC   ibuprofen 200 MG tablet Commonly known as: ADVIL       TAKE  these medications    acetaminophen 500 MG tablet Commonly known as: TYLENOL Take 2 tablets (1,000 mg total) by mouth every 8 (eight) hours as needed. What changed:  when to take this reasons to take this   atenolol 25 MG tablet Commonly known as: TENORMIN Take 1 tablet (25 mg total) by mouth 2 (two) times daily.   chlorhexidine 0.12 % solution Commonly known as: PERIDEX Use as directed 15 mLs in the mouth or throat in the morning, at noon, and at bedtime.   dorzolamide 2 % ophthalmic solution Commonly known as: TRUSOPT Place 1 drop into the left eye 2  (two) times daily.   Eliquis 2.5 MG Tabs tablet Generic drug: apixaban TAKE 1 TABLET(2.5 MG) BY MOUTH TWICE DAILY What changed: See the new instructions.   ezetimibe 10 MG tablet Commonly known as: ZETIA Take 1 tablet (10 mg total) by mouth daily.   furosemide 40 MG tablet Commonly known as: LASIX Take 1 tablet (40 mg total) by mouth daily.   latanoprost 0.005 % ophthalmic solution Commonly known as: XALATAN Place 1 drop into both eyes at bedtime.   levothyroxine 50 MCG tablet Commonly known as: SYNTHROID Take 1 tablet (50 mcg total) by mouth daily. What changed: Another medication with the same name was removed. Continue taking this medication, and follow the directions you see here.   multivitamin with minerals Tabs tablet Take 1 tablet by mouth daily.   neomycin-polymyxin b-dexamethasone 3.5-10000-0.1 Susp Commonly known as: MAXITROL Place 1 drop into both eyes in the morning and at bedtime.   nitroGLYCERIN 0.4 MG SL tablet Commonly known as: Nitrostat DISSOLVE 1 TABLET UNDER THE TONGUE EVERY 5 MINUTES FOR 3 DOSES AS NEEDED  FOR  CHEST  PAIN What changed:  how much to take how to take this when to take this reasons to take this additional instructions   ofloxacin 0.3 % ophthalmic solution Commonly known as: OCUFLOX Place 1 drop into the right eye 4 (four) times daily.   omeprazole 20 MG capsule Commonly known as: PRILOSEC Take 20 mg by mouth daily as needed (acid reflux).   oxyCODONE 5 MG immediate release tablet Commonly known as: Oxy IR/ROXICODONE Take 0.5 tablets (2.5 mg total) by mouth every 6 (six) hours as needed for moderate pain.   pramipexole 0.125 MG tablet Commonly known as: MIRAPEX TAKE 1 TABLET BY MOUTH AT  BEDTIME   Premarin vaginal cream Generic drug: conjugated estrogens Place 2 g vaginally 2 (two) times a week. Uses on Sunday and wednesday   Refresh Lacri-Lube Oint Place 1 Application into both eyes at bedtime.   REFRESH OP Place 2  drops into both eyes daily as needed (dry eye).   Rhopressa 0.02 % Soln Generic drug: Netarsudil Dimesylate Place 1 drop into the right eye every morning.   senna-docusate 8.6-50 MG tablet Commonly known as: Senokot-S Take 1 tablet by mouth daily.   sodium chloride 5 % ophthalmic solution Commonly known as: MURO 128 Place 1 drop into the right eye at bedtime.   temazepam 15 MG capsule Commonly known as: RESTORIL Take 1 capsule (15 mg total) by mouth at bedtime.        Discharge Exam: Filed Weights   12/29/22 0458 12/30/22 0500 12/31/22 0500  Weight: 55.3 kg 58.4 kg 58.2 kg   General-appears in no acute distress Heart-S1-S2, regular, no murmur auscultated Lungs-clear to auscultation bilaterally, no wheezing or crackles auscultated Abdomen-soft, nontender, no organomegaly Extremities-no edema in the lower extremities Neuro-alert, oriented x3, no  focal deficit noted  Condition at discharge: good  The results of significant diagnostics from this hospitalization (including imaging, microbiology, ancillary and laboratory) are listed below for reference.   Imaging Studies: CT PELVIS W CONTRAST  Result Date: 12/26/2022 CLINICAL DATA:  Recent fall with pubic rami fractures on the left EXAM: CT PELVIS WITH CONTRAST TECHNIQUE: Multidetector CT imaging of the pelvis was performed using the standard protocol following the bolus administration of intravenous contrast. RADIATION DOSE REDUCTION: This exam was performed according to the departmental dose-optimization program which includes automated exposure control, adjustment of the mA and/or kV according to patient size and/or use of iterative reconstruction technique. CONTRAST:  80mL OMNIPAQUE IOHEXOL 300 MG/ML  SOLN COMPARISON:  None Available. FINDINGS: Urinary Tract: Simple cyst is noted in the lower pole of the right kidney. No follow-up is necessary. No obstructive changes are seen. The bladder is partially distended. Bowel: No  obstructive changes of the visualized bowel are seen. Diverticular changes are noted in the colon. Vascular/Lymphatic: Atherosclerotic calcifications are noted. No aneurysmal dilatation is seen. Reproductive:  Uterus has been surgically removed. Other: No free fluid is noted. Considerable density is noted throughout the subcutaneous fat near the greater trochanter on the left consistent with recent injury. Some pooling of contrast material is noted along the greater trochanter on the left. Musculoskeletal: The known inferior pubic ramus fracture on the left is seen with extension to the superior aspect the pubic symphysis. Multiple comminuted fragments are seen. Additionally, fracture involving the left sacrum is seen. Considerable thickening of the operator muscles on the left is noted related to the fracture. This extends superiorly from the pubic symphysis consistent with local hemorrhage. IMPRESSION: Fractures involving the inferior pubic ramus and pubic symphysis on the left with associated soft tissue hemorrhage. Fracture involving the left sacrum is noted as well. Soft tissue hemorrhage is noted adjacent to the above mention fractures as well as in the soft tissues adjacent to the greater trochanter. No expanding hematoma is noted. Electronically Signed   By: Alcide Clever M.D.   On: 12/26/2022 03:42   DG Knee Complete 4 Views Left  Result Date: 12/26/2022 CLINICAL DATA:  Recent fall with left knee pain, initial encounter EXAM: LEFT KNEE - COMPLETE 4+ VIEW COMPARISON:  None Available. FINDINGS: No evidence of fracture, dislocation, or joint effusion. No evidence of arthropathy or other focal bone abnormality. Soft tissues are unremarkable. IMPRESSION: No acute abnormality noted. Electronically Signed   By: Alcide Clever M.D.   On: 12/26/2022 01:27   DG Hip Unilat With Pelvis 2-3 Views Left  Result Date: 12/26/2022 CLINICAL DATA:  Recent fall with hip pain, initial encounter EXAM: DG HIP (WITH OR WITHOUT  PELVIS) 3V LEFT COMPARISON:  None Available. FINDINGS: Pelvic ring shows fracture through the inferior pubic ramus on the left as well as extending through the pubic symphysis medially. Proximal femurs are well seated. No acute femoral fracture is noted. No soft tissue changes are seen. IMPRESSION: Fractures involving the inferior pubic ramus on the left and extending superiorly through the pubic symphysis. Electronically Signed   By: Alcide Clever M.D.   On: 12/26/2022 01:27   DG Chest Port 1 View  Result Date: 12/26/2022 CLINICAL DATA:  Status post fall.  Pain. EXAM: PORTABLE CHEST 1 VIEW COMPARISON:  Radiographs 07/08/2022 FINDINGS: Stable cardiomediastinal silhouette. Aortic atherosclerotic calcification. Chronic bronchitic changes. No focal consolidation, pleural effusion, or pneumothorax. The previous nodular opacity in the right lower lobe is no longer visualized. Demineralization. No definite  displaced rib fractures. Remote right fourth posterior rib fracture. IMPRESSION: No acute cardiopulmonary process. Electronically Signed   By: Minerva Fester M.D.   On: 12/26/2022 01:23    Microbiology: Results for orders placed or performed during the hospital encounter of 07/08/22  Resp Panel by RT-PCR (Flu A&B, Covid) Anterior Nasal Swab     Status: None   Collection Time: 07/08/22  3:54 PM   Specimen: Anterior Nasal Swab  Result Value Ref Range Status   SARS Coronavirus 2 by RT PCR NEGATIVE NEGATIVE Final    Comment: (NOTE) SARS-CoV-2 target nucleic acids are NOT DETECTED.  The SARS-CoV-2 RNA is generally detectable in upper respiratory specimens during the acute phase of infection. The lowest concentration of SARS-CoV-2 viral copies this assay can detect is 138 copies/mL. A negative result does not preclude SARS-Cov-2 infection and should not be used as the sole basis for treatment or other patient management decisions. A negative result may occur with  improper specimen collection/handling,  submission of specimen other than nasopharyngeal swab, presence of viral mutation(s) within the areas targeted by this assay, and inadequate number of viral copies(<138 copies/mL). A negative result must be combined with clinical observations, patient history, and epidemiological information. The expected result is Negative.  Fact Sheet for Patients:  BloggerCourse.com  Fact Sheet for Healthcare Providers:  SeriousBroker.it  This test is no t yet approved or cleared by the [place] FDA and  has been authorized for detection and/or diagnosis of SARS-CoV-2 by FDA under an Emergency Use Authorization (EUA). This EUA will remain  in effect (meaning this test can be used) for the duration of the COVID-19 declaration under Section 564(b)(1) of the Act, 21 U.S.C.section 360bbb-3(b)(1), unless the authorization is terminated  or revoked sooner.       Influenza A by PCR NEGATIVE NEGATIVE Final   Influenza B by PCR NEGATIVE NEGATIVE Final    Comment: (NOTE) The Xpert Xpress SARS-CoV-2/FLU/RSV plus assay is intended as an aid in the diagnosis of influenza from Nasopharyngeal swab specimens and should not be used as a sole basis for treatment. Nasal washings and aspirates are unacceptable for Xpert Xpress SARS-CoV-2/FLU/RSV testing.  Fact Sheet for Patients: BloggerCourse.com  Fact Sheet for Healthcare Providers: SeriousBroker.it  This test is not yet approved or cleared by the [place] FDA and has been authorized for detection and/or diagnosis of SARS-CoV-2 by FDA under an Emergency Use Authorization (EUA). This EUA will remain in effect (meaning this test can be used) for the duration of the COVID-19 declaration under Section 564(b)(1) of the Act, 21 U.S.C. section 360bbb-3(b)(1), unless the authorization is terminated or revoked.  Performed at [facility], [address] [place], [place] [address]   MRSA Next Gen by PCR, Nasal     Status: None   Collection Time: 07/09/22  6:31 AM   Specimen: Nasal Mucosa; Nasal Swab  Result Value Ref Range Status   MRSA by PCR Next Gen NOT DETECTED NOT DETECTED Final    Comment: (NOTE) The GeneXpert MRSA Assay (FDA approved for NASAL specimens only), is one component of a comprehensive MRSA colonization surveillance program. It is not intended to diagnose MRSA infection nor to guide or monitor treatment for MRSA infections. Test performance is not FDA approved in patients less than 33 years old. Performed at [facility], [address], [place], [place] [address]    *Note: Due to a large number of results and/or encounters for the requested time period, some  results have not been displayed. A complete set of results can be found in Results Review.    Labs: CBC: Recent Labs  Lab 12/26/22 0040 12/27/22 0651  WBC 10.0 8.2  NEUTROABS 6.9  --   HGB 13.2 9.7*  HCT 39.1 29.3*  MCV 94.7 98.0  PLT 208 151   Basic Metabolic Panel: Recent Labs  Lab 12/26/22 0040 12/27/22 0537 12/28/22 0509  NA 134* 131* 133*  K 2.8* 5.1 4.2  CL 98 99 102  CO2 26 26 24   GLUCOSE 152* 106* 110*  BUN 18 17 19   CREATININE 0.74 0.95 0.95  CALCIUM 7.7* 7.1* 7.0*     Discharge time spent: greater than 30 minutes.  Signed: [name], MD [facility] 12/31/2022

## 2023-01-03 DIAGNOSIS — D649 Anemia, unspecified: Secondary | ICD-10-CM | POA: Diagnosis not present

## 2023-01-03 DIAGNOSIS — S32512A Fracture of superior rim of left pubis, initial encounter for closed fracture: Secondary | ICD-10-CM | POA: Diagnosis not present

## 2023-01-03 DIAGNOSIS — I480 Paroxysmal atrial fibrillation: Secondary | ICD-10-CM | POA: Diagnosis not present

## 2023-01-21 DIAGNOSIS — D649 Anemia, unspecified: Secondary | ICD-10-CM | POA: Diagnosis not present

## 2023-01-21 DIAGNOSIS — I110 Hypertensive heart disease with heart failure: Secondary | ICD-10-CM | POA: Diagnosis not present

## 2023-01-21 DIAGNOSIS — E46 Unspecified protein-calorie malnutrition: Secondary | ICD-10-CM | POA: Diagnosis not present

## 2023-01-21 DIAGNOSIS — E039 Hypothyroidism, unspecified: Secondary | ICD-10-CM | POA: Diagnosis not present

## 2023-01-21 DIAGNOSIS — Z79891 Long term (current) use of opiate analgesic: Secondary | ICD-10-CM | POA: Diagnosis not present

## 2023-01-21 DIAGNOSIS — E785 Hyperlipidemia, unspecified: Secondary | ICD-10-CM | POA: Diagnosis not present

## 2023-01-21 DIAGNOSIS — S32502D Unspecified fracture of left pubis, subsequent encounter for fracture with routine healing: Secondary | ICD-10-CM | POA: Diagnosis not present

## 2023-01-21 DIAGNOSIS — I5032 Chronic diastolic (congestive) heart failure: Secondary | ICD-10-CM | POA: Diagnosis not present

## 2023-01-21 DIAGNOSIS — Z9181 History of falling: Secondary | ICD-10-CM | POA: Diagnosis not present

## 2023-01-21 DIAGNOSIS — Z7901 Long term (current) use of anticoagulants: Secondary | ICD-10-CM | POA: Diagnosis not present

## 2023-01-21 DIAGNOSIS — I480 Paroxysmal atrial fibrillation: Secondary | ICD-10-CM | POA: Diagnosis not present

## 2023-01-22 ENCOUNTER — Telehealth: Payer: Self-pay

## 2023-01-22 NOTE — Telephone Encounter (Signed)
Appointment has been change to virtual Product/process development scientist)

## 2023-01-22 NOTE — Telephone Encounter (Signed)
That should be fine. 

## 2023-01-22 NOTE — Telephone Encounter (Signed)
Toniann Fail, patient's caregiver called inquiring about patient's appointment for tomorrow. She wants to know if visit can be a virtual visit. Just checking to be sure before changing to virtual visit.  Message sent to [facility]. NP

## 2023-01-23 ENCOUNTER — Encounter: Payer: Medicare Other | Admitting: Orthopedic Surgery

## 2023-01-24 DIAGNOSIS — E785 Hyperlipidemia, unspecified: Secondary | ICD-10-CM | POA: Diagnosis not present

## 2023-01-24 DIAGNOSIS — D649 Anemia, unspecified: Secondary | ICD-10-CM | POA: Diagnosis not present

## 2023-01-24 DIAGNOSIS — E46 Unspecified protein-calorie malnutrition: Secondary | ICD-10-CM | POA: Diagnosis not present

## 2023-01-24 DIAGNOSIS — E039 Hypothyroidism, unspecified: Secondary | ICD-10-CM | POA: Diagnosis not present

## 2023-01-24 DIAGNOSIS — Z79891 Long term (current) use of opiate analgesic: Secondary | ICD-10-CM | POA: Diagnosis not present

## 2023-01-24 DIAGNOSIS — Z7901 Long term (current) use of anticoagulants: Secondary | ICD-10-CM | POA: Diagnosis not present

## 2023-01-24 DIAGNOSIS — S32502D Unspecified fracture of left pubis, subsequent encounter for fracture with routine healing: Secondary | ICD-10-CM | POA: Diagnosis not present

## 2023-01-24 DIAGNOSIS — I480 Paroxysmal atrial fibrillation: Secondary | ICD-10-CM | POA: Diagnosis not present

## 2023-01-24 DIAGNOSIS — Z9181 History of falling: Secondary | ICD-10-CM | POA: Diagnosis not present

## 2023-01-24 DIAGNOSIS — I5032 Chronic diastolic (congestive) heart failure: Secondary | ICD-10-CM | POA: Diagnosis not present

## 2023-01-24 DIAGNOSIS — I110 Hypertensive heart disease with heart failure: Secondary | ICD-10-CM | POA: Diagnosis not present

## 2023-01-27 DIAGNOSIS — Z79891 Long term (current) use of opiate analgesic: Secondary | ICD-10-CM | POA: Diagnosis not present

## 2023-01-27 DIAGNOSIS — E46 Unspecified protein-calorie malnutrition: Secondary | ICD-10-CM | POA: Diagnosis not present

## 2023-01-27 DIAGNOSIS — I5032 Chronic diastolic (congestive) heart failure: Secondary | ICD-10-CM | POA: Diagnosis not present

## 2023-01-27 DIAGNOSIS — I110 Hypertensive heart disease with heart failure: Secondary | ICD-10-CM | POA: Diagnosis not present

## 2023-01-27 DIAGNOSIS — Z9181 History of falling: Secondary | ICD-10-CM | POA: Diagnosis not present

## 2023-01-27 DIAGNOSIS — Z7901 Long term (current) use of anticoagulants: Secondary | ICD-10-CM | POA: Diagnosis not present

## 2023-01-27 DIAGNOSIS — E785 Hyperlipidemia, unspecified: Secondary | ICD-10-CM | POA: Diagnosis not present

## 2023-01-27 DIAGNOSIS — E039 Hypothyroidism, unspecified: Secondary | ICD-10-CM | POA: Diagnosis not present

## 2023-01-27 DIAGNOSIS — I480 Paroxysmal atrial fibrillation: Secondary | ICD-10-CM | POA: Diagnosis not present

## 2023-01-27 DIAGNOSIS — D649 Anemia, unspecified: Secondary | ICD-10-CM | POA: Diagnosis not present

## 2023-01-27 DIAGNOSIS — S32502D Unspecified fracture of left pubis, subsequent encounter for fracture with routine healing: Secondary | ICD-10-CM | POA: Diagnosis not present

## 2023-01-28 ENCOUNTER — Telehealth: Payer: Medicare Other | Admitting: Family

## 2023-01-28 DIAGNOSIS — E785 Hyperlipidemia, unspecified: Secondary | ICD-10-CM | POA: Diagnosis not present

## 2023-01-28 DIAGNOSIS — Z9181 History of falling: Secondary | ICD-10-CM | POA: Diagnosis not present

## 2023-01-28 DIAGNOSIS — Z79891 Long term (current) use of opiate analgesic: Secondary | ICD-10-CM | POA: Diagnosis not present

## 2023-01-28 DIAGNOSIS — I480 Paroxysmal atrial fibrillation: Secondary | ICD-10-CM | POA: Diagnosis not present

## 2023-01-28 DIAGNOSIS — I110 Hypertensive heart disease with heart failure: Secondary | ICD-10-CM | POA: Diagnosis not present

## 2023-01-28 DIAGNOSIS — Z7901 Long term (current) use of anticoagulants: Secondary | ICD-10-CM | POA: Diagnosis not present

## 2023-01-28 DIAGNOSIS — E039 Hypothyroidism, unspecified: Secondary | ICD-10-CM | POA: Diagnosis not present

## 2023-01-28 DIAGNOSIS — I5032 Chronic diastolic (congestive) heart failure: Secondary | ICD-10-CM | POA: Diagnosis not present

## 2023-01-28 DIAGNOSIS — E46 Unspecified protein-calorie malnutrition: Secondary | ICD-10-CM | POA: Diagnosis not present

## 2023-01-28 DIAGNOSIS — D649 Anemia, unspecified: Secondary | ICD-10-CM | POA: Diagnosis not present

## 2023-01-28 DIAGNOSIS — S32502D Unspecified fracture of left pubis, subsequent encounter for fracture with routine healing: Secondary | ICD-10-CM | POA: Diagnosis not present

## 2023-01-29 DIAGNOSIS — H353211 Exudative age-related macular degeneration, right eye, with active choroidal neovascularization: Secondary | ICD-10-CM | POA: Diagnosis not present

## 2023-01-30 DIAGNOSIS — S32502D Unspecified fracture of left pubis, subsequent encounter for fracture with routine healing: Secondary | ICD-10-CM | POA: Diagnosis not present

## 2023-01-30 DIAGNOSIS — I110 Hypertensive heart disease with heart failure: Secondary | ICD-10-CM | POA: Diagnosis not present

## 2023-01-30 DIAGNOSIS — Z9181 History of falling: Secondary | ICD-10-CM | POA: Diagnosis not present

## 2023-01-30 DIAGNOSIS — Z7901 Long term (current) use of anticoagulants: Secondary | ICD-10-CM | POA: Diagnosis not present

## 2023-01-30 DIAGNOSIS — D649 Anemia, unspecified: Secondary | ICD-10-CM | POA: Diagnosis not present

## 2023-01-30 DIAGNOSIS — E785 Hyperlipidemia, unspecified: Secondary | ICD-10-CM | POA: Diagnosis not present

## 2023-01-30 DIAGNOSIS — I5032 Chronic diastolic (congestive) heart failure: Secondary | ICD-10-CM | POA: Diagnosis not present

## 2023-01-30 DIAGNOSIS — I480 Paroxysmal atrial fibrillation: Secondary | ICD-10-CM | POA: Diagnosis not present

## 2023-01-30 DIAGNOSIS — E039 Hypothyroidism, unspecified: Secondary | ICD-10-CM | POA: Diagnosis not present

## 2023-01-30 DIAGNOSIS — Z79891 Long term (current) use of opiate analgesic: Secondary | ICD-10-CM | POA: Diagnosis not present

## 2023-01-30 DIAGNOSIS — E46 Unspecified protein-calorie malnutrition: Secondary | ICD-10-CM | POA: Diagnosis not present

## 2023-01-31 DIAGNOSIS — R2689 Other abnormalities of gait and mobility: Secondary | ICD-10-CM | POA: Diagnosis not present

## 2023-01-31 DIAGNOSIS — I69398 Other sequelae of cerebral infarction: Secondary | ICD-10-CM | POA: Diagnosis not present

## 2023-01-31 DIAGNOSIS — M6281 Muscle weakness (generalized): Secondary | ICD-10-CM | POA: Diagnosis not present

## 2023-01-31 DIAGNOSIS — R2681 Unsteadiness on feet: Secondary | ICD-10-CM | POA: Diagnosis not present

## 2023-02-03 DIAGNOSIS — E039 Hypothyroidism, unspecified: Secondary | ICD-10-CM | POA: Diagnosis not present

## 2023-02-03 DIAGNOSIS — S32502D Unspecified fracture of left pubis, subsequent encounter for fracture with routine healing: Secondary | ICD-10-CM | POA: Diagnosis not present

## 2023-02-03 DIAGNOSIS — D649 Anemia, unspecified: Secondary | ICD-10-CM | POA: Diagnosis not present

## 2023-02-03 DIAGNOSIS — I110 Hypertensive heart disease with heart failure: Secondary | ICD-10-CM | POA: Diagnosis not present

## 2023-02-03 DIAGNOSIS — I480 Paroxysmal atrial fibrillation: Secondary | ICD-10-CM | POA: Diagnosis not present

## 2023-02-03 DIAGNOSIS — Z9181 History of falling: Secondary | ICD-10-CM | POA: Diagnosis not present

## 2023-02-03 DIAGNOSIS — E785 Hyperlipidemia, unspecified: Secondary | ICD-10-CM | POA: Diagnosis not present

## 2023-02-03 DIAGNOSIS — Z7901 Long term (current) use of anticoagulants: Secondary | ICD-10-CM | POA: Diagnosis not present

## 2023-02-03 DIAGNOSIS — I5032 Chronic diastolic (congestive) heart failure: Secondary | ICD-10-CM | POA: Diagnosis not present

## 2023-02-03 DIAGNOSIS — E46 Unspecified protein-calorie malnutrition: Secondary | ICD-10-CM | POA: Diagnosis not present

## 2023-02-03 DIAGNOSIS — Z79891 Long term (current) use of opiate analgesic: Secondary | ICD-10-CM | POA: Diagnosis not present

## 2023-02-04 DIAGNOSIS — I110 Hypertensive heart disease with heart failure: Secondary | ICD-10-CM | POA: Diagnosis not present

## 2023-02-04 DIAGNOSIS — S32502D Unspecified fracture of left pubis, subsequent encounter for fracture with routine healing: Secondary | ICD-10-CM | POA: Diagnosis not present

## 2023-02-04 DIAGNOSIS — E039 Hypothyroidism, unspecified: Secondary | ICD-10-CM | POA: Diagnosis not present

## 2023-02-04 DIAGNOSIS — E46 Unspecified protein-calorie malnutrition: Secondary | ICD-10-CM | POA: Diagnosis not present

## 2023-02-04 DIAGNOSIS — I480 Paroxysmal atrial fibrillation: Secondary | ICD-10-CM | POA: Diagnosis not present

## 2023-02-04 DIAGNOSIS — Z9181 History of falling: Secondary | ICD-10-CM | POA: Diagnosis not present

## 2023-02-04 DIAGNOSIS — D649 Anemia, unspecified: Secondary | ICD-10-CM | POA: Diagnosis not present

## 2023-02-04 DIAGNOSIS — I5032 Chronic diastolic (congestive) heart failure: Secondary | ICD-10-CM | POA: Diagnosis not present

## 2023-02-04 DIAGNOSIS — Z79891 Long term (current) use of opiate analgesic: Secondary | ICD-10-CM | POA: Diagnosis not present

## 2023-02-04 DIAGNOSIS — Z7901 Long term (current) use of anticoagulants: Secondary | ICD-10-CM | POA: Diagnosis not present

## 2023-02-04 DIAGNOSIS — E785 Hyperlipidemia, unspecified: Secondary | ICD-10-CM | POA: Diagnosis not present

## 2023-02-05 DIAGNOSIS — E785 Hyperlipidemia, unspecified: Secondary | ICD-10-CM | POA: Diagnosis not present

## 2023-02-05 DIAGNOSIS — E039 Hypothyroidism, unspecified: Secondary | ICD-10-CM | POA: Diagnosis not present

## 2023-02-05 DIAGNOSIS — I110 Hypertensive heart disease with heart failure: Secondary | ICD-10-CM | POA: Diagnosis not present

## 2023-02-05 DIAGNOSIS — Z79891 Long term (current) use of opiate analgesic: Secondary | ICD-10-CM | POA: Diagnosis not present

## 2023-02-05 DIAGNOSIS — I480 Paroxysmal atrial fibrillation: Secondary | ICD-10-CM | POA: Diagnosis not present

## 2023-02-05 DIAGNOSIS — Z9181 History of falling: Secondary | ICD-10-CM | POA: Diagnosis not present

## 2023-02-05 DIAGNOSIS — S32502D Unspecified fracture of left pubis, subsequent encounter for fracture with routine healing: Secondary | ICD-10-CM | POA: Diagnosis not present

## 2023-02-05 DIAGNOSIS — I5032 Chronic diastolic (congestive) heart failure: Secondary | ICD-10-CM | POA: Diagnosis not present

## 2023-02-05 DIAGNOSIS — D649 Anemia, unspecified: Secondary | ICD-10-CM | POA: Diagnosis not present

## 2023-02-05 DIAGNOSIS — E46 Unspecified protein-calorie malnutrition: Secondary | ICD-10-CM | POA: Diagnosis not present

## 2023-02-05 DIAGNOSIS — Z7901 Long term (current) use of anticoagulants: Secondary | ICD-10-CM | POA: Diagnosis not present

## 2023-02-06 ENCOUNTER — Telehealth (INDEPENDENT_AMBULATORY_CARE_PROVIDER_SITE_OTHER): Payer: Medicare Other | Admitting: Orthopedic Surgery

## 2023-02-06 ENCOUNTER — Telehealth: Payer: Medicare Other | Admitting: Orthopedic Surgery

## 2023-02-06 ENCOUNTER — Encounter: Payer: Self-pay | Admitting: Orthopedic Surgery

## 2023-02-06 DIAGNOSIS — K219 Gastro-esophageal reflux disease without esophagitis: Secondary | ICD-10-CM

## 2023-02-06 DIAGNOSIS — S32592D Other specified fracture of left pubis, subsequent encounter for fracture with routine healing: Secondary | ICD-10-CM | POA: Diagnosis not present

## 2023-02-06 MED ORDER — OMEPRAZOLE 20 MG PO CPDR
20.0000 mg | DELAYED_RELEASE_CAPSULE | Freq: Every day | ORAL | 2 refills | Status: DC
Start: 2023-02-06 — End: 2023-11-10

## 2023-02-06 NOTE — Progress Notes (Signed)
Careteam: Patient Care Team: [name], NP as PCP - General (Adult Health Nurse Practitioner) O'Neal, Ronnald Ramp, MD as PCP - Cardiology (Cardiology) Clarene Duke Karma Lew, RN as [facility] Care Management [place], Enrique Sack as [facility] Care Management O'Neal, Ronnald Ramp, MD as Consulting Physician (Cardiology)  Seen by: Hazle Nordmann, AGNP-C  PLACE OF SERVICE:  [facility]  Advanced Directive information Does Patient Have a Medical Advance Directive?: Yes, Type of Advance Directive: Out of facility DNR (pink MOST or yellow form);Healthcare Power of [place];Living will, Does patient want to make changes to medical advance directive?: No - Patient declined  Allergies  Allergen Reactions   Azithromycin Nausea Only   Compazine [Prochlorperazine Edisylate] Nausea Only   Demerol Nausea Only   Dolophine [Methadone] Nausea Only   Ebastine Nausea Only    EBS   Erythromycin Nausea Only   Gluten Meal Diarrhea   Ibuprofen Nausea Only   Meperidine Hcl Other (See Comments)    Unknown    Morphine Sulfate Itching   Statins Other (See Comments)    myalgias   Tetanus Toxoids Nausea Only   Tetracyclines & Related Nausea Only    No chief complaint on file.    HPI: Patient is a 89 y.o. female seen today via video visit for hospital follow up 05/09-05/14.   05/09 hospitalized due to pelvic/rib fracture. She was discharged to [facility] for additional PT/OT. She was discharged home about 3 weeks ago. She is ambulating with wheelchair. PT is coming 2 times weekly. WBAT. HH RN also visiting. She is doing some mild walking and bed exercises. She admits to some stiffness. She is off oxycodone. She is taking tylenol prn. No falls since returning home. Appetite fair. She is drinking supplement shake daily. Last weight at home 111 lbs.    Review of Systems:  Review of Systems  Constitutional:  Positive for malaise/fatigue.  HENT: Negative.    Eyes:  Positive for  blurred vision.  Respiratory:  Negative for cough, sputum production, shortness of breath and wheezing.   Cardiovascular:  Negative for chest pain and leg swelling.  Gastrointestinal: Negative.  Negative for constipation.  Genitourinary: Negative.   Musculoskeletal:  Positive for joint pain. Negative for falls.  Skin: Negative.   Neurological:  Positive for weakness. Negative for dizziness.  Psychiatric/Behavioral:  Negative for depression. The patient has insomnia. The patient is not nervous/anxious.     Past Medical History:  Diagnosis Date   Acute on chronic diastolic CHF (congestive heart failure) (HCC) 02/01/2014   Bladder prolapse, female, acquired    Cancer [facility])    thyroid   Chest pain    no known ischemic heart disease; negative Myoview July 2013. EF 75% with no ischemia.    Chronic anticoagulation    Chronic atrial fibrillation (HCC)    managed with rate control and coumadin   Chronic diastolic heart failure (HCC)    GERD (gastroesophageal reflux disease)    Heart disease    History of congenital mitral regurgitation    mild   History of heart attack    Multiple    History of mitral valve prolapse 06/15/2008   a. echo 1/14: mild LVH, EF 60-65%, mild MR, mild to mod BAE, PASP 35   Hypercholesterolemia    Hypertension    Hypothyroidism    Stroke [facility])    Past Surgical History:  Procedure Laterality Date   ABDOMINAL HYSTERECTOMY     CATARACT EXTRACTION  CHOLECYSTECTOMY     Thyroidectomy     TONSILLECTOMY     Social History:   reports that she has never smoked. She has never used smokeless tobacco. She reports that she does not drink alcohol and does not use drugs.  Family History  Problem Relation Age of Onset   Stroke Mother 12   Stroke Father 10   Arthritis Daughter    Gout Daughter    Immunocompromised Granddaughter    Heart attack Neg Hx    Heart disease Neg Hx     Medications: Patient's Medications  New Prescriptions   No medications on file   Previous Medications   ACETAMINOPHEN (TYLENOL) 500 MG TABLET    Take 2 tablets (1,000 mg total) by mouth every 8 (eight) hours as needed.   ATENOLOL (TENORMIN) 25 MG TABLET    Take 25 mg by mouth every morning.   CHLORHEXIDINE (PERIDEX) 0.12 % SOLUTION    Use as directed 15 mLs in the mouth or throat in the morning, at noon, and at bedtime.   DOCUSATE SODIUM (COLACE PO)    Take 1 tablet by mouth daily.   DORZOLAMIDE (TRUSOPT) 2 % OPHTHALMIC SOLUTION    Place 1 drop into the left eye 2 (two) times daily.   ELIQUIS 2.5 MG TABS TABLET    TAKE 1 TABLET(2.5 MG) BY MOUTH TWICE DAILY   EZETIMIBE (ZETIA) 10 MG TABLET    Take 1 tablet (10 mg total) by mouth daily.   FUROSEMIDE (LASIX) 40 MG TABLET    Take 1 tablet (40 mg total) by mouth daily.   LATANOPROST (XALATAN) 0.005 % OPHTHALMIC SOLUTION    Place 1 drop into both eyes at bedtime.    LEVOTHYROXINE (SYNTHROID) 50 MCG TABLET    Take 1 tablet (50 mcg total) by mouth daily.   MULTIPLE VITAMIN (MULTIVITAMIN WITH MINERALS) TABS TABLET    Take 1 tablet by mouth daily.   NITROGLYCERIN (NITROSTAT) 0.4 MG SL TABLET    DISSOLVE 1 TABLET UNDER THE TONGUE EVERY 5 MINUTES FOR 3 DOSES AS NEEDED  FOR  CHEST  PAIN   OMEPRAZOLE (PRILOSEC) 20 MG CAPSULE    Take 20 mg by mouth daily as needed (acid reflux).   OXYCODONE (OXY IR/ROXICODONE) 5 MG IMMEDIATE RELEASE TABLET    Take 0.5 tablets (2.5 mg total) by mouth every 6 (six) hours as needed for moderate pain.   POLYVINYL ALCOHOL-POVIDONE (REFRESH OP)    Place 2 drops into both eyes daily as needed (dry eye).   PREMARIN VAGINAL CREAM    Place 2 g vaginally 2 (two) times a week. Uses on Sunday and wednesday   RHOPRESSA 0.02 % SOLN    Place 1 drop into the right eye every morning.   TEMAZEPAM (RESTORIL) 15 MG CAPSULE    Take 1 capsule (15 mg total) by mouth at bedtime.   WHITE PETROLATUM-MINERAL OIL (REFRESH LACRI-LUBE) OINT    Place 1 Application into both eyes at bedtime.  Modified Medications   No medications on  file  Discontinued Medications   ATENOLOL (TENORMIN) 25 MG TABLET    Take 1 tablet (25 mg total) by mouth 2 (two) times daily.   NEOMYCIN-POLYMYXIN B-DEXAMETHASONE (MAXITROL) 3.5-10000-0.1 SUSP    Place 1 drop into both eyes in the morning and at bedtime.   OFLOXACIN (OCUFLOX) 0.3 % OPHTHALMIC SOLUTION    Place 1 drop into the right eye 4 (four) times daily.   PRAMIPEXOLE (MIRAPEX) 0.125 MG TABLET    TAKE  1 TABLET BY MOUTH AT  BEDTIME   SENNA-DOCUSATE (SENOKOT-S) 8.6-50 MG TABLET    Take 1 tablet by mouth daily.   SODIUM CHLORIDE (MURO 128) 5 % OPHTHALMIC SOLUTION    Place 1 drop into the right eye at bedtime.     Physical Exam:  There were no vitals filed for this visit. There is no height or weight on file to calculate BMI. Wt Readings from Last 3 Encounters:  12/31/22 128 lb 4.9 oz (58.2 kg)  11/28/22 116 lb (52.6 kg)  11/12/22 116 lb 6.4 oz (52.8 kg)    Physical Exam Vitals (Exam limited due to virtual visit) reviewed.  Constitutional:      General: She is not in acute distress. HENT:     Head: Normocephalic.  Neurological:     Mental Status: She is alert.     Labs reviewed: Basic Metabolic Panel: Recent Labs    05/20/22 1120 06/24/22 1624 07/08/22 1454 07/08/22 1738 07/08/22 2225 10/17/22 1613 10/23/22 1233 12/26/22 0040 12/27/22 0537 12/28/22 0509  NA  --    < >  --  139   < > 141   < > 134* 131* 133*  K  --    < >  --  3.5   < > 3.7   < > 2.8* 5.1 4.2  CL  --    < >  --  106   < > 99   < > 98 99 102  CO2  --    < >  --  27   < > 35*   < > 26 26 24   GLUCOSE  --    < >  --  125*   < > 92   < > 152* 106* 110*  BUN  --    < >  --  9   < > 11   < > 18 17 19   CREATININE  --    < >  --  0.65   < > 0.66   < > 0.74 0.95 0.95  CALCIUM  --    < >  --  7.6*   < > 8.7   < > 7.7* 7.1* 7.0*  MG  --   --   --  1.8  --   --   --   --   --   --   TSH 17.58*  --  19.195*  --   --  0.24*  --   --   --   --    < > = values in this interval not displayed.   Liver Function  Tests: Recent Labs    08/15/22 1139 10/17/22 1613 10/23/22 1233  AST 25 19 23   ALT 20 13 13   ALKPHOS  --   --  79  BILITOT 0.6 0.5 0.6  PROT 7.0 6.5 7.5  ALBUMIN  --   --  4.0   No results for input(s): "LIPASE", "AMYLASE" in the last 8760 hours. No results for input(s): "AMMONIA" in the last 8760 hours. CBC: Recent Labs    10/17/22 1613 10/23/22 1233 12/26/22 0040 12/27/22 0651  WBC 6.6 7.6 10.0 8.2  NEUTROABS 3,168 4.3 6.9  --   HGB 14.1 14.7 13.2 9.7*  HCT 42.4 42.8 39.1 29.3*  MCV 95.5 95.5 94.7 98.0  PLT 262 186 208 151   Lipid Panel: Recent Labs    07/09/22 0332  CHOL 207*  HDL 82  LDLCALC 108*  TRIG 84  CHOLHDL  2.5   TSH: Recent Labs    05/20/22 1120 07/08/22 1454 10/17/22 1613  TSH 17.58* 19.195* 0.24*   A1C: Lab Results  Component Value Date   HGBA1C 5.8 (H) 07/09/2022     Assessment/Plan 1. Closed fracture of left inferior pubic ramus with routine healing, subsequent encounter - 05/09 mechanical fall  - hospitalized 05/09- 05/14 - discharged to [facility] - now home - pain controlled with tylenol - working with [facility] PT 2x/week - no recent falls  2. Gastroesophageal reflux disease without esophagitis - hgb stable  - cont omeprazole  - omeprazole (PRILOSEC) 20 MG capsule; Take 1 capsule (20 mg total) by mouth daily.  Dispense: 90 capsule; Refill: 2    Virtual Visit   I connected with Kristen Whitehead and verified that I am speaking with the correct person using two identifiers.  Location: [facility] Patient: Kristen Whitehead Provider: [name], NP    I discussed the limitations, risks, security and privacy concerns of performing an evaluation and management service by telephone and the availability of in person appointments. I also discussed with the patient that there may be a patient responsible charge related to this service. The patient expressed understanding and agreed to proceed.   I discussed the  assessment and treatment plan with the patient. The patient was provided an opportunity to ask questions and all were answered. The patient agreed with the plan and demonstrated an understanding of the instructions.   The patient was advised to call back or seek an in-person evaluation if the symptoms worsen or if the condition fails to improve as anticipated.  I provided 12 minutes of non-face-to-face time during this encounter.  Hazle Nordmann NP Avs printed and mailed    Next appt: 03/06/2023  Sahmya Arai Scherry Whitehead  [facility] [phone]    This service is provided via telemedicine  No vital signs collected/recorded due to the encounter was a telemedicine visit.   Location of patient (ex: home, work):  Home  Patient consents to a telephone visit:  Yes  Location of the provider (ex: office, home):  [employer]  Name of any referring provider:  [name], NP   Names of all persons participating in the telemedicine service and their role in the encounter:  Patient, Meda Klinefelter, RMA, Hazle Nordmann, NP.    Time spent on call:  8 minutes spent on the phone with Medical Assistant.

## 2023-02-11 DIAGNOSIS — Z9181 History of falling: Secondary | ICD-10-CM | POA: Diagnosis not present

## 2023-02-11 DIAGNOSIS — E039 Hypothyroidism, unspecified: Secondary | ICD-10-CM | POA: Diagnosis not present

## 2023-02-11 DIAGNOSIS — E46 Unspecified protein-calorie malnutrition: Secondary | ICD-10-CM | POA: Diagnosis not present

## 2023-02-11 DIAGNOSIS — Z7901 Long term (current) use of anticoagulants: Secondary | ICD-10-CM | POA: Diagnosis not present

## 2023-02-11 DIAGNOSIS — I5032 Chronic diastolic (congestive) heart failure: Secondary | ICD-10-CM | POA: Diagnosis not present

## 2023-02-11 DIAGNOSIS — I480 Paroxysmal atrial fibrillation: Secondary | ICD-10-CM | POA: Diagnosis not present

## 2023-02-11 DIAGNOSIS — I110 Hypertensive heart disease with heart failure: Secondary | ICD-10-CM | POA: Diagnosis not present

## 2023-02-11 DIAGNOSIS — S32502D Unspecified fracture of left pubis, subsequent encounter for fracture with routine healing: Secondary | ICD-10-CM | POA: Diagnosis not present

## 2023-02-11 DIAGNOSIS — E785 Hyperlipidemia, unspecified: Secondary | ICD-10-CM | POA: Diagnosis not present

## 2023-02-11 DIAGNOSIS — D649 Anemia, unspecified: Secondary | ICD-10-CM | POA: Diagnosis not present

## 2023-02-11 DIAGNOSIS — Z79891 Long term (current) use of opiate analgesic: Secondary | ICD-10-CM | POA: Diagnosis not present

## 2023-02-12 DIAGNOSIS — S32502D Unspecified fracture of left pubis, subsequent encounter for fracture with routine healing: Secondary | ICD-10-CM | POA: Diagnosis not present

## 2023-02-12 DIAGNOSIS — I480 Paroxysmal atrial fibrillation: Secondary | ICD-10-CM | POA: Diagnosis not present

## 2023-02-12 DIAGNOSIS — E785 Hyperlipidemia, unspecified: Secondary | ICD-10-CM | POA: Diagnosis not present

## 2023-02-12 DIAGNOSIS — E46 Unspecified protein-calorie malnutrition: Secondary | ICD-10-CM | POA: Diagnosis not present

## 2023-02-12 DIAGNOSIS — I5032 Chronic diastolic (congestive) heart failure: Secondary | ICD-10-CM | POA: Diagnosis not present

## 2023-02-12 DIAGNOSIS — Z9181 History of falling: Secondary | ICD-10-CM | POA: Diagnosis not present

## 2023-02-12 DIAGNOSIS — I110 Hypertensive heart disease with heart failure: Secondary | ICD-10-CM | POA: Diagnosis not present

## 2023-02-12 DIAGNOSIS — Z7901 Long term (current) use of anticoagulants: Secondary | ICD-10-CM | POA: Diagnosis not present

## 2023-02-12 DIAGNOSIS — D649 Anemia, unspecified: Secondary | ICD-10-CM | POA: Diagnosis not present

## 2023-02-12 DIAGNOSIS — E039 Hypothyroidism, unspecified: Secondary | ICD-10-CM | POA: Diagnosis not present

## 2023-02-12 DIAGNOSIS — Z79891 Long term (current) use of opiate analgesic: Secondary | ICD-10-CM | POA: Diagnosis not present

## 2023-02-13 DIAGNOSIS — E785 Hyperlipidemia, unspecified: Secondary | ICD-10-CM | POA: Diagnosis not present

## 2023-02-13 DIAGNOSIS — Z9181 History of falling: Secondary | ICD-10-CM | POA: Diagnosis not present

## 2023-02-13 DIAGNOSIS — I480 Paroxysmal atrial fibrillation: Secondary | ICD-10-CM | POA: Diagnosis not present

## 2023-02-13 DIAGNOSIS — E039 Hypothyroidism, unspecified: Secondary | ICD-10-CM | POA: Diagnosis not present

## 2023-02-13 DIAGNOSIS — S32502D Unspecified fracture of left pubis, subsequent encounter for fracture with routine healing: Secondary | ICD-10-CM | POA: Diagnosis not present

## 2023-02-13 DIAGNOSIS — I5032 Chronic diastolic (congestive) heart failure: Secondary | ICD-10-CM | POA: Diagnosis not present

## 2023-02-13 DIAGNOSIS — E46 Unspecified protein-calorie malnutrition: Secondary | ICD-10-CM | POA: Diagnosis not present

## 2023-02-13 DIAGNOSIS — D649 Anemia, unspecified: Secondary | ICD-10-CM | POA: Diagnosis not present

## 2023-02-13 DIAGNOSIS — Z79891 Long term (current) use of opiate analgesic: Secondary | ICD-10-CM | POA: Diagnosis not present

## 2023-02-13 DIAGNOSIS — I110 Hypertensive heart disease with heart failure: Secondary | ICD-10-CM | POA: Diagnosis not present

## 2023-02-13 DIAGNOSIS — Z7901 Long term (current) use of anticoagulants: Secondary | ICD-10-CM | POA: Diagnosis not present

## 2023-02-17 DIAGNOSIS — I480 Paroxysmal atrial fibrillation: Secondary | ICD-10-CM | POA: Diagnosis not present

## 2023-02-17 DIAGNOSIS — Z79891 Long term (current) use of opiate analgesic: Secondary | ICD-10-CM | POA: Diagnosis not present

## 2023-02-17 DIAGNOSIS — E039 Hypothyroidism, unspecified: Secondary | ICD-10-CM | POA: Diagnosis not present

## 2023-02-17 DIAGNOSIS — Z9181 History of falling: Secondary | ICD-10-CM | POA: Diagnosis not present

## 2023-02-17 DIAGNOSIS — E785 Hyperlipidemia, unspecified: Secondary | ICD-10-CM | POA: Diagnosis not present

## 2023-02-17 DIAGNOSIS — I110 Hypertensive heart disease with heart failure: Secondary | ICD-10-CM | POA: Diagnosis not present

## 2023-02-17 DIAGNOSIS — E46 Unspecified protein-calorie malnutrition: Secondary | ICD-10-CM | POA: Diagnosis not present

## 2023-02-17 DIAGNOSIS — S32502D Unspecified fracture of left pubis, subsequent encounter for fracture with routine healing: Secondary | ICD-10-CM | POA: Diagnosis not present

## 2023-02-17 DIAGNOSIS — D649 Anemia, unspecified: Secondary | ICD-10-CM | POA: Diagnosis not present

## 2023-02-17 DIAGNOSIS — I5032 Chronic diastolic (congestive) heart failure: Secondary | ICD-10-CM | POA: Diagnosis not present

## 2023-02-17 DIAGNOSIS — Z7901 Long term (current) use of anticoagulants: Secondary | ICD-10-CM | POA: Diagnosis not present

## 2023-02-18 DIAGNOSIS — E46 Unspecified protein-calorie malnutrition: Secondary | ICD-10-CM | POA: Diagnosis not present

## 2023-02-18 DIAGNOSIS — I5032 Chronic diastolic (congestive) heart failure: Secondary | ICD-10-CM | POA: Diagnosis not present

## 2023-02-18 DIAGNOSIS — I480 Paroxysmal atrial fibrillation: Secondary | ICD-10-CM | POA: Diagnosis not present

## 2023-02-18 DIAGNOSIS — D649 Anemia, unspecified: Secondary | ICD-10-CM | POA: Diagnosis not present

## 2023-02-18 DIAGNOSIS — I110 Hypertensive heart disease with heart failure: Secondary | ICD-10-CM | POA: Diagnosis not present

## 2023-02-18 DIAGNOSIS — E785 Hyperlipidemia, unspecified: Secondary | ICD-10-CM | POA: Diagnosis not present

## 2023-02-18 DIAGNOSIS — Z7901 Long term (current) use of anticoagulants: Secondary | ICD-10-CM | POA: Diagnosis not present

## 2023-02-18 DIAGNOSIS — Z9181 History of falling: Secondary | ICD-10-CM | POA: Diagnosis not present

## 2023-02-18 DIAGNOSIS — Z79891 Long term (current) use of opiate analgesic: Secondary | ICD-10-CM | POA: Diagnosis not present

## 2023-02-18 DIAGNOSIS — S32502D Unspecified fracture of left pubis, subsequent encounter for fracture with routine healing: Secondary | ICD-10-CM | POA: Diagnosis not present

## 2023-02-18 DIAGNOSIS — E039 Hypothyroidism, unspecified: Secondary | ICD-10-CM | POA: Diagnosis not present

## 2023-02-19 DIAGNOSIS — I480 Paroxysmal atrial fibrillation: Secondary | ICD-10-CM | POA: Diagnosis not present

## 2023-02-19 DIAGNOSIS — D649 Anemia, unspecified: Secondary | ICD-10-CM | POA: Diagnosis not present

## 2023-02-19 DIAGNOSIS — I110 Hypertensive heart disease with heart failure: Secondary | ICD-10-CM | POA: Diagnosis not present

## 2023-02-19 DIAGNOSIS — E46 Unspecified protein-calorie malnutrition: Secondary | ICD-10-CM | POA: Diagnosis not present

## 2023-02-19 DIAGNOSIS — E039 Hypothyroidism, unspecified: Secondary | ICD-10-CM | POA: Diagnosis not present

## 2023-02-19 DIAGNOSIS — Z9181 History of falling: Secondary | ICD-10-CM | POA: Diagnosis not present

## 2023-02-19 DIAGNOSIS — I5032 Chronic diastolic (congestive) heart failure: Secondary | ICD-10-CM | POA: Diagnosis not present

## 2023-02-19 DIAGNOSIS — Z7901 Long term (current) use of anticoagulants: Secondary | ICD-10-CM | POA: Diagnosis not present

## 2023-02-19 DIAGNOSIS — S32502D Unspecified fracture of left pubis, subsequent encounter for fracture with routine healing: Secondary | ICD-10-CM | POA: Diagnosis not present

## 2023-02-19 DIAGNOSIS — E785 Hyperlipidemia, unspecified: Secondary | ICD-10-CM | POA: Diagnosis not present

## 2023-02-19 DIAGNOSIS — Z79891 Long term (current) use of opiate analgesic: Secondary | ICD-10-CM | POA: Diagnosis not present

## 2023-02-20 DIAGNOSIS — D649 Anemia, unspecified: Secondary | ICD-10-CM | POA: Diagnosis not present

## 2023-02-20 DIAGNOSIS — I5032 Chronic diastolic (congestive) heart failure: Secondary | ICD-10-CM | POA: Diagnosis not present

## 2023-02-20 DIAGNOSIS — Z79891 Long term (current) use of opiate analgesic: Secondary | ICD-10-CM | POA: Diagnosis not present

## 2023-02-20 DIAGNOSIS — E46 Unspecified protein-calorie malnutrition: Secondary | ICD-10-CM | POA: Diagnosis not present

## 2023-02-20 DIAGNOSIS — Z9181 History of falling: Secondary | ICD-10-CM | POA: Diagnosis not present

## 2023-02-20 DIAGNOSIS — I480 Paroxysmal atrial fibrillation: Secondary | ICD-10-CM | POA: Diagnosis not present

## 2023-02-20 DIAGNOSIS — S32502D Unspecified fracture of left pubis, subsequent encounter for fracture with routine healing: Secondary | ICD-10-CM | POA: Diagnosis not present

## 2023-02-20 DIAGNOSIS — E039 Hypothyroidism, unspecified: Secondary | ICD-10-CM | POA: Diagnosis not present

## 2023-02-20 DIAGNOSIS — I110 Hypertensive heart disease with heart failure: Secondary | ICD-10-CM | POA: Diagnosis not present

## 2023-02-20 DIAGNOSIS — E785 Hyperlipidemia, unspecified: Secondary | ICD-10-CM | POA: Diagnosis not present

## 2023-02-20 DIAGNOSIS — Z7901 Long term (current) use of anticoagulants: Secondary | ICD-10-CM | POA: Diagnosis not present

## 2023-02-24 DIAGNOSIS — S32502D Unspecified fracture of left pubis, subsequent encounter for fracture with routine healing: Secondary | ICD-10-CM | POA: Diagnosis not present

## 2023-02-24 DIAGNOSIS — Z9181 History of falling: Secondary | ICD-10-CM | POA: Diagnosis not present

## 2023-02-24 DIAGNOSIS — I5032 Chronic diastolic (congestive) heart failure: Secondary | ICD-10-CM | POA: Diagnosis not present

## 2023-02-24 DIAGNOSIS — Z7901 Long term (current) use of anticoagulants: Secondary | ICD-10-CM | POA: Diagnosis not present

## 2023-02-24 DIAGNOSIS — E46 Unspecified protein-calorie malnutrition: Secondary | ICD-10-CM | POA: Diagnosis not present

## 2023-02-24 DIAGNOSIS — D649 Anemia, unspecified: Secondary | ICD-10-CM | POA: Diagnosis not present

## 2023-02-24 DIAGNOSIS — I110 Hypertensive heart disease with heart failure: Secondary | ICD-10-CM | POA: Diagnosis not present

## 2023-02-24 DIAGNOSIS — E785 Hyperlipidemia, unspecified: Secondary | ICD-10-CM | POA: Diagnosis not present

## 2023-02-24 DIAGNOSIS — Z79891 Long term (current) use of opiate analgesic: Secondary | ICD-10-CM | POA: Diagnosis not present

## 2023-02-24 DIAGNOSIS — I480 Paroxysmal atrial fibrillation: Secondary | ICD-10-CM | POA: Diagnosis not present

## 2023-02-24 DIAGNOSIS — E039 Hypothyroidism, unspecified: Secondary | ICD-10-CM | POA: Diagnosis not present

## 2023-02-25 ENCOUNTER — Telehealth: Payer: Self-pay

## 2023-02-25 DIAGNOSIS — I480 Paroxysmal atrial fibrillation: Secondary | ICD-10-CM | POA: Diagnosis not present

## 2023-02-25 DIAGNOSIS — I5032 Chronic diastolic (congestive) heart failure: Secondary | ICD-10-CM | POA: Diagnosis not present

## 2023-02-25 DIAGNOSIS — Z9181 History of falling: Secondary | ICD-10-CM | POA: Diagnosis not present

## 2023-02-25 DIAGNOSIS — D649 Anemia, unspecified: Secondary | ICD-10-CM | POA: Diagnosis not present

## 2023-02-25 DIAGNOSIS — Z79891 Long term (current) use of opiate analgesic: Secondary | ICD-10-CM | POA: Diagnosis not present

## 2023-02-25 DIAGNOSIS — E039 Hypothyroidism, unspecified: Secondary | ICD-10-CM | POA: Diagnosis not present

## 2023-02-25 DIAGNOSIS — E46 Unspecified protein-calorie malnutrition: Secondary | ICD-10-CM | POA: Diagnosis not present

## 2023-02-25 DIAGNOSIS — I110 Hypertensive heart disease with heart failure: Secondary | ICD-10-CM | POA: Diagnosis not present

## 2023-02-25 DIAGNOSIS — E785 Hyperlipidemia, unspecified: Secondary | ICD-10-CM | POA: Diagnosis not present

## 2023-02-25 DIAGNOSIS — S32502D Unspecified fracture of left pubis, subsequent encounter for fracture with routine healing: Secondary | ICD-10-CM | POA: Diagnosis not present

## 2023-02-25 DIAGNOSIS — Z7901 Long term (current) use of anticoagulants: Secondary | ICD-10-CM | POA: Diagnosis not present

## 2023-02-25 NOTE — Telephone Encounter (Signed)
04/11 visit her temazepam was increased to 15 mg due to ongoing insomnia. Recommend staying at that dose unless increased  drowsiness.

## 2023-02-25 NOTE — Telephone Encounter (Signed)
PA request received for Temazepa 7.5 mg capsule. According to patient's active medication list she is on 15mg . What should patient be taking and do I need to proceed with PA for 7.5mg .  Message sent to Hazle Nordmann, NP

## 2023-02-26 DIAGNOSIS — Z9181 History of falling: Secondary | ICD-10-CM | POA: Diagnosis not present

## 2023-02-26 DIAGNOSIS — D649 Anemia, unspecified: Secondary | ICD-10-CM | POA: Diagnosis not present

## 2023-02-26 DIAGNOSIS — E785 Hyperlipidemia, unspecified: Secondary | ICD-10-CM | POA: Diagnosis not present

## 2023-02-26 DIAGNOSIS — I5032 Chronic diastolic (congestive) heart failure: Secondary | ICD-10-CM | POA: Diagnosis not present

## 2023-02-26 DIAGNOSIS — I110 Hypertensive heart disease with heart failure: Secondary | ICD-10-CM | POA: Diagnosis not present

## 2023-02-26 DIAGNOSIS — E039 Hypothyroidism, unspecified: Secondary | ICD-10-CM | POA: Diagnosis not present

## 2023-02-26 DIAGNOSIS — I480 Paroxysmal atrial fibrillation: Secondary | ICD-10-CM | POA: Diagnosis not present

## 2023-02-26 DIAGNOSIS — E46 Unspecified protein-calorie malnutrition: Secondary | ICD-10-CM | POA: Diagnosis not present

## 2023-02-26 DIAGNOSIS — Z7901 Long term (current) use of anticoagulants: Secondary | ICD-10-CM | POA: Diagnosis not present

## 2023-02-26 DIAGNOSIS — S32502D Unspecified fracture of left pubis, subsequent encounter for fracture with routine healing: Secondary | ICD-10-CM | POA: Diagnosis not present

## 2023-02-26 DIAGNOSIS — Z79891 Long term (current) use of opiate analgesic: Secondary | ICD-10-CM | POA: Diagnosis not present

## 2023-02-27 ENCOUNTER — Ambulatory Visit: Payer: Medicare Other | Admitting: Orthopedic Surgery

## 2023-02-27 DIAGNOSIS — E785 Hyperlipidemia, unspecified: Secondary | ICD-10-CM | POA: Diagnosis not present

## 2023-02-27 DIAGNOSIS — I5032 Chronic diastolic (congestive) heart failure: Secondary | ICD-10-CM | POA: Diagnosis not present

## 2023-02-27 DIAGNOSIS — E039 Hypothyroidism, unspecified: Secondary | ICD-10-CM | POA: Diagnosis not present

## 2023-02-27 DIAGNOSIS — S32502D Unspecified fracture of left pubis, subsequent encounter for fracture with routine healing: Secondary | ICD-10-CM | POA: Diagnosis not present

## 2023-02-27 DIAGNOSIS — Z9181 History of falling: Secondary | ICD-10-CM | POA: Diagnosis not present

## 2023-02-27 DIAGNOSIS — E46 Unspecified protein-calorie malnutrition: Secondary | ICD-10-CM | POA: Diagnosis not present

## 2023-02-27 DIAGNOSIS — Z7901 Long term (current) use of anticoagulants: Secondary | ICD-10-CM | POA: Diagnosis not present

## 2023-02-27 DIAGNOSIS — Z79891 Long term (current) use of opiate analgesic: Secondary | ICD-10-CM | POA: Diagnosis not present

## 2023-02-27 DIAGNOSIS — I110 Hypertensive heart disease with heart failure: Secondary | ICD-10-CM | POA: Diagnosis not present

## 2023-02-27 DIAGNOSIS — D649 Anemia, unspecified: Secondary | ICD-10-CM | POA: Diagnosis not present

## 2023-02-27 DIAGNOSIS — I480 Paroxysmal atrial fibrillation: Secondary | ICD-10-CM | POA: Diagnosis not present

## 2023-03-02 DIAGNOSIS — R2681 Unsteadiness on feet: Secondary | ICD-10-CM | POA: Diagnosis not present

## 2023-03-02 DIAGNOSIS — I69398 Other sequelae of cerebral infarction: Secondary | ICD-10-CM | POA: Diagnosis not present

## 2023-03-02 DIAGNOSIS — M6281 Muscle weakness (generalized): Secondary | ICD-10-CM | POA: Diagnosis not present

## 2023-03-02 DIAGNOSIS — R2689 Other abnormalities of gait and mobility: Secondary | ICD-10-CM | POA: Diagnosis not present

## 2023-03-03 DIAGNOSIS — E039 Hypothyroidism, unspecified: Secondary | ICD-10-CM | POA: Diagnosis not present

## 2023-03-03 DIAGNOSIS — E785 Hyperlipidemia, unspecified: Secondary | ICD-10-CM | POA: Diagnosis not present

## 2023-03-03 DIAGNOSIS — Z79891 Long term (current) use of opiate analgesic: Secondary | ICD-10-CM | POA: Diagnosis not present

## 2023-03-03 DIAGNOSIS — I480 Paroxysmal atrial fibrillation: Secondary | ICD-10-CM | POA: Diagnosis not present

## 2023-03-03 DIAGNOSIS — Z9181 History of falling: Secondary | ICD-10-CM | POA: Diagnosis not present

## 2023-03-03 DIAGNOSIS — D649 Anemia, unspecified: Secondary | ICD-10-CM | POA: Diagnosis not present

## 2023-03-03 DIAGNOSIS — I5032 Chronic diastolic (congestive) heart failure: Secondary | ICD-10-CM | POA: Diagnosis not present

## 2023-03-03 DIAGNOSIS — Z7901 Long term (current) use of anticoagulants: Secondary | ICD-10-CM | POA: Diagnosis not present

## 2023-03-03 DIAGNOSIS — I110 Hypertensive heart disease with heart failure: Secondary | ICD-10-CM | POA: Diagnosis not present

## 2023-03-03 DIAGNOSIS — E46 Unspecified protein-calorie malnutrition: Secondary | ICD-10-CM | POA: Diagnosis not present

## 2023-03-03 DIAGNOSIS — S32502D Unspecified fracture of left pubis, subsequent encounter for fracture with routine healing: Secondary | ICD-10-CM | POA: Diagnosis not present

## 2023-03-04 DIAGNOSIS — E785 Hyperlipidemia, unspecified: Secondary | ICD-10-CM | POA: Diagnosis not present

## 2023-03-04 DIAGNOSIS — S32502D Unspecified fracture of left pubis, subsequent encounter for fracture with routine healing: Secondary | ICD-10-CM | POA: Diagnosis not present

## 2023-03-04 DIAGNOSIS — D649 Anemia, unspecified: Secondary | ICD-10-CM | POA: Diagnosis not present

## 2023-03-04 DIAGNOSIS — I480 Paroxysmal atrial fibrillation: Secondary | ICD-10-CM | POA: Diagnosis not present

## 2023-03-04 DIAGNOSIS — Z7901 Long term (current) use of anticoagulants: Secondary | ICD-10-CM | POA: Diagnosis not present

## 2023-03-04 DIAGNOSIS — I110 Hypertensive heart disease with heart failure: Secondary | ICD-10-CM | POA: Diagnosis not present

## 2023-03-04 DIAGNOSIS — Z9181 History of falling: Secondary | ICD-10-CM | POA: Diagnosis not present

## 2023-03-04 DIAGNOSIS — Z79891 Long term (current) use of opiate analgesic: Secondary | ICD-10-CM | POA: Diagnosis not present

## 2023-03-04 DIAGNOSIS — E039 Hypothyroidism, unspecified: Secondary | ICD-10-CM | POA: Diagnosis not present

## 2023-03-04 DIAGNOSIS — I5032 Chronic diastolic (congestive) heart failure: Secondary | ICD-10-CM | POA: Diagnosis not present

## 2023-03-04 DIAGNOSIS — E46 Unspecified protein-calorie malnutrition: Secondary | ICD-10-CM | POA: Diagnosis not present

## 2023-03-05 ENCOUNTER — Ambulatory Visit: Payer: Medicare Other | Admitting: Family Medicine

## 2023-03-05 ENCOUNTER — Encounter: Payer: Self-pay | Admitting: Family Medicine

## 2023-03-05 VITALS — BP 139/90 | HR 141 | Ht <= 58 in | Wt 111.4 lb

## 2023-03-05 DIAGNOSIS — M542 Cervicalgia: Secondary | ICD-10-CM | POA: Diagnosis not present

## 2023-03-05 DIAGNOSIS — I6302 Cerebral infarction due to thrombosis of basilar artery: Secondary | ICD-10-CM

## 2023-03-05 NOTE — Patient Instructions (Signed)
Below is our plan:  We will continue to monitor symptoms. Please talk with your PCP tomorrow about the abnormal imaging results with Dr Frances Furbish 08/2022 and show them the bump on your arm.   Please make sure you are staying well hydrated. I recommend 50-60 ounces daily. Well balanced diet and regular exercise encouraged. Consistent sleep schedule with 6-8 hours recommended.   Please continue follow up with care team as directed.   Follow up with me as needed   You may receive a survey regarding today's visit. I encourage you to leave honest feed back as I do use this information to improve patient care. Thank you for seeing me today!

## 2023-03-05 NOTE — Progress Notes (Signed)
Chief Complaint  Patient presents with   Room 16    Pt is here is with her Daughter and Ivor Costa. Pt states that things have been a lot better. Pt states that she is walking more. Pt states that her RLS has been doing better. Pt states that her legs feels numb. Pt has a (spider bite) on her arm and her arm is blue and would like for the doctor to look at it.     HISTORY OF PRESENT ILLNESS:  03/05/23 ALL:  Kristen Whitehead is a 89 y.o. female here today for follow up s/p CVA 06/2022. She was seen in consult with Dr Frances Furbish 08/2022. Stroke prevention discussed and MRI cervical spine ordered for concerns of neck pain. Results showed possible inflammatory, bone marrow cancer or metastatic disease. She was referred back to PCP for further eval.   Since, she reports doing better. She did have a fall with fracture of inferior pubic ramus. She is working with PT. She is more active around the home. She is using her walker at home. No falls since. RLS symptoms improved. Legs feel numb intermittently. Not painful. She stays cold. She has not seen PCP. She has appt tomorrow.   She has a red bump on right forearm. She noticed about a week ago. Concerned it may have been a spider bite. No obvious puncture marks on skin. She has had significant bruising. Bump is tender but no other pain. No warmth, draining or swelling.   Now living [place] her [place]. Her granddaughter and great grand son live on the family compound and help care for her. She continues Eliquis. Not on statin therapy.    HISTORY (copied from Dr Teofilo Pod previous note)  I saw Kristen Whitehead as a referral from the hospital for evaluation after her recent stroke.  The patient is accompanied by her daughter today.  I had previously followed her in the past for restless leg syndrome.  Kristen Whitehead is a 89 year old female with an underlying complex medical history of thyroid cancer, chronic atrial fibrillation,  chronic diastolic congestive heart failure, history of heart attack, macular degeneration, history of mitral valve prolapse, hypertension, hyperlipidemia, hypothyroidism, restless leg syndrome, symptoms, and reflux disease, who reports feeling better.  She was hospitalized in November from 07/08/2022 through 07/15/2022.  She presented with inability to walk and weakness.  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  Initial CT head without contrast was negative, CT angiogram head and neck revealed the thrombus.  She was outside the window for thrombolysis.  Stroke was deemed secondary to thromboembolism with history of A-fib not on anticoagulation.  She had stopped Eliquis due to history of falls.  Her A-fib with RVR improved with the addition of atenolol.  Thrombectomy was deferred and patient was placed on heparin drip with transition to Eliquis.  MRI of the brain showed small acute infarcts in the left midbrain and right medial pons.  Echocardiogram showed an EF of 60 to 65%, LDL was 108 and she was started on Zetia.  A1c was 5.8.  She was started on Eliquis 2.5 mg twice daily.   She currently stays with her granddaughter.  Her daughter lives close by, they all live on family property.  She is a non-smoker.  She does not currently drink any alcohol.  She does drink caffeine in the form of decaf tea but several cups per day,  on average 6 to 8 cups.  She reports that she has had neck discomfort in the past 6 months, particularly on the left side radiating upwards to her ear or behind the ear.  She has occasional occipital headaches.  These are not new.  As far as her restless legs, she no longer takes pramipexole.  Of note, she takes temazepam nightly, 15 mg strength.  She has been on temazepam for years and reports that she cannot sleep without it.   REVIEW OF SYSTEMS: Out of a complete 14 system review of symptoms, the patient complains only of the following  symptoms, numbness of feet and legs, easy bruising, imbalance, hip pain and all other reviewed systems are negative.   ALLERGIES: Allergies  Allergen Reactions   Azithromycin Nausea Only   Compazine [Prochlorperazine Edisylate] Nausea Only   Demerol Nausea Only   Dolophine [Methadone] Nausea Only   Ebastine Nausea Only    EBS   Erythromycin Nausea Only   Gluten Meal Diarrhea   Ibuprofen Nausea Only   Meperidine Hcl Other (See Comments)    Unknown    Morphine Sulfate Itching   Statins Other (See Comments)    myalgias   Tetanus Toxoids Nausea Only   Tetracyclines & Related Nausea Only     HOME MEDICATIONS: Outpatient Medications Prior to Visit  Medication Sig Dispense Refill   acetaminophen (TYLENOL) 500 MG tablet Take 2 tablets (1,000 mg total) by mouth every 8 (eight) hours as needed. 30 tablet 0   atenolol (TENORMIN) 25 MG tablet Take 25 mg by mouth every morning.     chlorhexidine (PERIDEX) 0.12 % solution Use as directed 15 mLs in the mouth or throat in the morning, at noon, and at bedtime.     dorzolamide (TRUSOPT) 2 % ophthalmic solution Place 1 drop into the left eye 2 (two) times daily.     ELIQUIS 2.5 MG TABS tablet TAKE 1 TABLET(2.5 MG) BY MOUTH TWICE DAILY 180 tablet 1   ezetimibe (ZETIA) 10 MG tablet Take 1 tablet (10 mg total) by mouth daily. 30 tablet 10   furosemide (LASIX) 40 MG tablet Take 1 tablet (40 mg total) by mouth daily. 90 tablet 3   latanoprost (XALATAN) 0.005 % ophthalmic solution Place 1 drop into both eyes at bedtime.      levothyroxine (SYNTHROID) 50 MCG tablet Take 1 tablet (50 mcg total) by mouth daily. 90 tablet 3   Multiple Vitamin (MULTIVITAMIN WITH MINERALS) TABS tablet Take 1 tablet by mouth daily. 30 tablet 0   nitroGLYCERIN (NITROSTAT) 0.4 MG SL tablet DISSOLVE 1 TABLET UNDER THE TONGUE EVERY 5 MINUTES FOR 3 DOSES AS NEEDED  FOR  CHEST  PAIN 50 tablet 2   omeprazole (PRILOSEC) 20 MG capsule Take 1 capsule (20 mg total) by mouth daily. 90  capsule 2   oxyCODONE (OXY IR/ROXICODONE) 5 MG immediate release tablet Take 0.5 tablets (2.5 mg total) by mouth every 6 (six) hours as needed for moderate pain. 5 tablet 0   Polyvinyl Alcohol-Povidone (REFRESH OP) Place 2 drops into both eyes daily as needed (dry eye).     PREMARIN vaginal cream Place 2 g vaginally 2 (two) times a week. Uses on Sunday and wednesday     RHOPRESSA 0.02 % SOLN Place 1 drop into the right eye every morning.     temazepam (RESTORIL) 15 MG capsule Take 1 capsule (15 mg total) by mouth at bedtime. 5 capsule 0   White Petrolatum-Mineral Oil (REFRESH LACRI-LUBE) OINT Place  1 Application into both eyes at bedtime.     Docusate Sodium (COLACE PO) Take 1 tablet by mouth daily. (Patient not taking: Reported on 03/05/2023)     No facility-administered medications prior to visit.     PAST MEDICAL HISTORY: Past Medical History:  Diagnosis Date   Acute on chronic diastolic CHF (congestive heart failure) (HCC) 02/01/2014   Bladder prolapse, female, acquired    Cancer [facility])    thyroid   Chest pain    no known ischemic heart disease; negative Myoview July 2013. EF 75% with no ischemia.    Chronic anticoagulation    Chronic atrial fibrillation (HCC)    managed with rate control and coumadin   Chronic diastolic heart failure (HCC)    GERD (gastroesophageal reflux disease)    Heart disease    History of congenital mitral regurgitation    mild   History of heart attack    Multiple    History of mitral valve prolapse 06/15/2008   a. echo 1/14: mild LVH, EF 60-65%, mild MR, mild to mod BAE, PASP 35   Hypercholesterolemia    Hypertension    Hypothyroidism    Stroke (HCC)      PAST SURGICAL HISTORY: Past Surgical History:  Procedure Laterality Date   ABDOMINAL HYSTERECTOMY     CATARACT EXTRACTION     CHOLECYSTECTOMY     Thyroidectomy     TONSILLECTOMY       FAMILY HISTORY: Family History  Problem Relation Age of Onset   Stroke Mother 74   Stroke Father 67    Arthritis Daughter    Gout Daughter    Immunocompromised Granddaughter    Heart attack Neg Hx    Heart disease Neg Hx      SOCIAL HISTORY: Social History   Socioeconomic History   Marital status: Widowed    Spouse name: Not on file   Number of children: 3   Years of education: BA   Highest education level: Not on file  Occupational History    Employer: RETIRED   Occupation: Retired [employer]  Tobacco Use   Smoking status: Never   Smokeless tobacco: Never  Vaping Use   Vaping status: Never Used  Substance and Sexual Activity   Alcohol use: No   Drug use: No   Sexual activity: Not Currently  Other Topics Concern   Not on file  Social History Narrative   Pt lives at home alone.   Caffeine Use: quit 14yrs ago      Diet: No [place]      Do you drink/ eat things with caffeineNo      Marital status:   Widowed                            What year were you married ? 1947      Do you live in a house, apartment,assistred living, condo, trailer, etc.)? [address]      Is it one or more stories? 1      How many persons live in your home ? 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  No  Type & how often       ADVANCED DIRECTIVES (Please bring copies)      Do you have a living Whitehead? Yes      Do you have a DNR form?   Yes                    If not, do you want to discuss one?       Do you have signed POA?HPOA forms?  Yes               If so, please bring to your appointment      FUNCTIONAL STATUS- To be completed by Spouse / child / Staff       Do you have difficulty bathing or dressing yourself ?  No      Do you have difficulty preparing food or eating ?  No      Do you have difficulty managing your mediation ?  No      Do you have difficulty managing your finances  ?  No      Do you have difficulty affording your medication ?  No      Social Determinants of Health   Financial Resource Strain: Low Risk  (05/06/2022)   Overall Financial Resource Strain (CARDIA)    Difficulty of Paying Living Expenses: Not hard at all  Food Insecurity: No Food Insecurity (12/26/2022)   Hunger Vital Sign    Worried About Running Out of Food in the Last Year: Never true    Ran Out of Food in the Last Year: Never true  Transportation Needs: No Transportation Needs (12/26/2022)   PRAPARE - Administrator, Civil Service (Medical): No    Lack of Transportation (Non-Medical): No  Physical Activity: Insufficiently Active (05/06/2022)   Exercise Vital Sign    Days of Exercise per Week: 2 days    Minutes of Exercise per Session: 10 min  Stress: No Stress Concern Present (05/06/2022)   [employer] of Occupational Health - Occupational Stress Questionnaire    Feeling of Stress : Not at all  Social Connections: Unknown (09/05/2022)   [facility] from [facility]   Social Network    Social Network: Not on file  Intimate Partner Violence: Not At Risk (12/26/2022)   Humiliation, Afraid, Rape, and Kick questionnaire    Fear of Current or Ex-Partner: No    Emotionally Abused: No    Physically Abused: No    Sexually Abused: No     PHYSICAL EXAM  Vitals:   03/05/23 1401  BP: (!) 139/90  Pulse: (!) 141  Weight: 111 lb 6.4 oz (50.5 kg)  Height: 4\' 10"  (1.473 m)   Body mass index is 23.28 kg/m.  Generalized: Well developed, in no acute distress  Cardiology: normal rate and rhythm, no murmur auscultated  Respiratory: clear to auscultation bilaterally    Neurological examination  Mentation: Alert oriented to time, place, history taking. Follows all commands speech and language fluent Cranial nerve II-XII: Pupils were equal round reactive to light. Extraocular movements were full, visual field were full on confrontational test. Facial sensation and strength  were normal. Uvula tongue midline. Head turning and shoulder shrug  were normal and symmetric. Motor: The motor testing reveals 4+ over 5 strength of all 4 extremities. Good symmetric motor tone is noted throughout.  Sensory: Sensory testing is intact to soft touch on all 4 extremities. No evidence of extinction is noted.  Gait and station: Gait is  stable with walker.  Skin: erythematous nodule of right posterior forearm. No obvious signs of infection but significant bruising noted.    DIAGNOSTIC DATA (LABS, IMAGING, TESTING) - I reviewed patient records, labs, notes, testing and imaging myself where available.  Lab Results  Component Value Date   WBC 8.2 12/27/2022   HGB 9.7 (L) 12/27/2022   HCT 29.3 (L) 12/27/2022   MCV 98.0 12/27/2022   PLT 151 12/27/2022      Component Value Date/Time   NA 133 (L) 12/28/2022 0509   NA 140 01/27/2020 1211   K 4.2 12/28/2022 0509   CL 102 12/28/2022 0509   CO2 24 12/28/2022 0509   GLUCOSE 110 (H) 12/28/2022 0509   BUN 19 12/28/2022 0509   BUN 11 01/27/2020 1211   CREATININE 0.95 12/28/2022 0509   CREATININE 0.78 10/23/2022 1233   CREATININE 0.66 10/17/2022 1613   CALCIUM 7.0 (L) 12/28/2022 0509   PROT 7.5 10/23/2022 1233   PROT 6.5 01/16/2018 1427   ALBUMIN 4.0 10/23/2022 1233   ALBUMIN 4.0 01/16/2018 1427   AST 23 10/23/2022 1233   ALT 13 10/23/2022 1233   ALKPHOS 79 10/23/2022 1233   BILITOT 0.6 10/23/2022 1233   GFRNONAA 54 (L) 12/28/2022 0509   GFRNONAA >60 10/23/2022 1233   GFRAA 70 01/27/2020 1211   Lab Results  Component Value Date   CHOL 207 (H) 07/09/2022   HDL 82 07/09/2022   LDLCALC 108 (H) 07/09/2022   LDLDIRECT 98.8 11/04/2012   TRIG 84 07/09/2022   CHOLHDL 2.5 07/09/2022   Lab Results  Component Value Date   HGBA1C 5.8 (H) 07/09/2022   Lab Results  Component Value Date   VITAMINB12 659 09/24/2012   Lab Results  Component Value Date   TSH 0.24 (L) 10/17/2022       11/23/2020    1:34 PM  MMSE - Mini  Mental State Exam  Orientation to time 4  Orientation to Place 5  Registration 3  Attention/ Calculation 5  Recall 3  Language- name 2 objects 2  Language- repeat 1  Language- follow 3 step command 3  Language- read & follow direction 1  Write a sentence 1  Copy design 1  Total score 29         No data to display           ASSESSMENT AND PLAN  89 y.o. year old female  has a past medical history of Acute on chronic diastolic CHF (congestive heart failure) (HCC) (02/01/2014), Bladder prolapse, female, acquired, Cancer (HCC), Chest pain, Chronic anticoagulation, Chronic atrial fibrillation (HCC), Chronic diastolic heart failure (HCC), GERD (gastroesophageal reflux disease), Heart disease, History of congenital mitral regurgitation, History of heart attack, History of mitral valve prolapse (06/15/2008), Hypercholesterolemia, Hypertension, Hypothyroidism, and Stroke (HCC). here with    Cerebral infarction due to thrombosis of basilar artery [facility])  Cervicalgia  Kristen Whitehead is doing well. We Whitehead continue to monitor. I have advised that she talk with her PCP, tomorrow, regarding abnormal imaging results and skin nodule. No obvious signs of infection noted, today. Fall precautions advised. Healthy lifestyle habits encouraged. She Whitehead follow up with PCP as directed. She Whitehead return to see me as needed. She verbalizes understanding and agreement with this plan.   No orders of the defined types were placed in this encounter.    No orders of the defined types were placed in this encounter.    Shawnie Dapper, MSN, FNP-C 03/05/2023, 4:19 PM  [facility] [address], [address] [place], [place] [address] [phone]

## 2023-03-06 ENCOUNTER — Encounter: Payer: Self-pay | Admitting: Orthopedic Surgery

## 2023-03-06 ENCOUNTER — Ambulatory Visit (INDEPENDENT_AMBULATORY_CARE_PROVIDER_SITE_OTHER): Payer: Medicare Other | Admitting: Orthopedic Surgery

## 2023-03-06 VITALS — BP 110/86 | HR 83 | Temp 97.6°F | Resp 16 | Ht <= 58 in | Wt 110.4 lb

## 2023-03-06 DIAGNOSIS — T63301A Toxic effect of unspecified spider venom, accidental (unintentional), initial encounter: Secondary | ICD-10-CM | POA: Diagnosis not present

## 2023-03-06 DIAGNOSIS — H903 Sensorineural hearing loss, bilateral: Secondary | ICD-10-CM | POA: Diagnosis not present

## 2023-03-06 MED ORDER — CEPHALEXIN 500 MG PO CAPS
500.0000 mg | ORAL_CAPSULE | Freq: Four times a day (QID) | ORAL | 0 refills | Status: DC
Start: 2023-03-06 — End: 2023-03-10

## 2023-03-06 NOTE — Progress Notes (Signed)
Careteam: Patient Care Team: [name], NP as PCP - General (Adult Health Nurse Practitioner) O'Neal, Ronnald Ramp, MD as PCP - Cardiology (Cardiology) Clarene Duke, Karma Lew, RN as [facility] Care Management O'Neal, Ronnald Ramp, MD as Consulting Physician (Cardiology)  Seen by: Hazle Nordmann, AGNP-C  PLACE OF SERVICE:  [facility]  Advanced Directive information    Allergies  Allergen Reactions   Azithromycin Nausea Only   Compazine [Prochlorperazine Edisylate] Nausea Only   Demerol Nausea Only   Dolophine [Methadone] Nausea Only   Ebastine Nausea Only    EBS   Erythromycin Nausea Only   Gluten Meal Diarrhea   Ibuprofen Nausea Only   Meperidine Hcl Other (See Comments)    Unknown    Morphine Sulfate Itching   Statins Other (See Comments)    myalgias   Tetanus Toxoids Nausea Only   Tetracyclines & Related Nausea Only    Chief Complaint  Patient presents with   Medical Management of Chronic Issues    3 month follow up.    Immunizations    Discuss the need for DTAP vaccine.      HPI: Patient is a 89 y.o. female seen today for acute visit due to spider bite.   Granddaughter present during encounter.   Right forearm with increased bruising. She also has a small area that resembles insect bite. Granddaughter suspects is it a spider bite. Denies recent injury. She is on low dose Eliquis. Afebrile. Vitals stable.   Continues to have diminished hearing after stroke. Requesting audiology referral.    Review of Systems:  Review of Systems  Constitutional:  Negative for fever.  HENT:  Positive for hearing loss. Negative for ear pain and tinnitus.   Respiratory:  Negative for cough, shortness of breath and wheezing.   Cardiovascular:  Negative for chest pain and leg swelling.  Musculoskeletal:  Negative for myalgias.  Skin:        Insect bite, bruising  Neurological:  Negative for sensory change.  Psychiatric/Behavioral:  Negative for depression. The  patient is not nervous/anxious.     Past Medical History:  Diagnosis Date   Acute on chronic diastolic CHF (congestive heart failure) (HCC) 02/01/2014   Bladder prolapse, female, acquired    Cancer [facility])    thyroid   Chest pain    no known ischemic heart disease; negative Myoview July 2013. EF 75% with no ischemia.    Chronic anticoagulation    Chronic atrial fibrillation (HCC)    managed with rate control and coumadin   Chronic diastolic heart failure (HCC)    GERD (gastroesophageal reflux disease)    Heart disease    History of congenital mitral regurgitation    mild   History of heart attack    Multiple    History of mitral valve prolapse 06/15/2008   a. echo 1/14: mild LVH, EF 60-65%, mild MR, mild to mod BAE, PASP 35   Hypercholesterolemia    Hypertension    Hypothyroidism    Stroke [facility])    Past Surgical History:  Procedure Laterality Date   ABDOMINAL HYSTERECTOMY     CATARACT EXTRACTION     CHOLECYSTECTOMY     Thyroidectomy     TONSILLECTOMY     Social History:   reports that she has never smoked. She has never used smokeless tobacco. She reports that she does not drink alcohol and does not use drugs.  Family History  Problem Relation Age of Onset   Stroke Mother 36  Stroke Father 72   Arthritis Daughter    Gout Daughter    Immunocompromised Granddaughter    Heart attack Neg Hx    Heart disease Neg Hx     Medications: Patient's Medications  New Prescriptions   No medications on file  Previous Medications   ACETAMINOPHEN (TYLENOL) 500 MG TABLET    Take 2 tablets (1,000 mg total) by mouth every 8 (eight) hours as needed.   ATENOLOL (TENORMIN) 25 MG TABLET    Take 25 mg by mouth every morning.   CHLORHEXIDINE (PERIDEX) 0.12 % SOLUTION    Use as directed 15 mLs in the mouth or throat in the morning, at noon, and at bedtime.   DOCUSATE SODIUM (COLACE PO)    Take 1 tablet by mouth daily.   DORZOLAMIDE (TRUSOPT) 2 % OPHTHALMIC SOLUTION    Place 1 drop into  the left eye 2 (two) times daily.   ELIQUIS 2.5 MG TABS TABLET    TAKE 1 TABLET(2.5 MG) BY MOUTH TWICE DAILY   EZETIMIBE (ZETIA) 10 MG TABLET    Take 1 tablet (10 mg total) by mouth daily.   FUROSEMIDE (LASIX) 40 MG TABLET    Take 1 tablet (40 mg total) by mouth daily.   LATANOPROST (XALATAN) 0.005 % OPHTHALMIC SOLUTION    Place 1 drop into both eyes at bedtime.    LEVOTHYROXINE (SYNTHROID) 50 MCG TABLET    Take 1 tablet (50 mcg total) by mouth daily.   MULTIPLE VITAMIN (MULTIVITAMIN WITH MINERALS) TABS TABLET    Take 1 tablet by mouth daily.   NITROGLYCERIN (NITROSTAT) 0.4 MG SL TABLET    DISSOLVE 1 TABLET UNDER THE TONGUE EVERY 5 MINUTES FOR 3 DOSES AS NEEDED  FOR  CHEST  PAIN   OMEPRAZOLE (PRILOSEC) 20 MG CAPSULE    Take 1 capsule (20 mg total) by mouth daily.   OXYCODONE (OXY IR/ROXICODONE) 5 MG IMMEDIATE RELEASE TABLET    Take 0.5 tablets (2.5 mg total) by mouth every 6 (six) hours as needed for moderate pain.   POLYVINYL ALCOHOL-POVIDONE (REFRESH OP)    Place 2 drops into both eyes daily as needed (dry eye).   PREMARIN VAGINAL CREAM    Place 2 g vaginally 2 (two) times a week. Uses on Sunday and wednesday   RHOPRESSA 0.02 % SOLN    Place 1 drop into the right eye every morning.   TEMAZEPAM (RESTORIL) 15 MG CAPSULE    Take 1 capsule (15 mg total) by mouth at bedtime.   WHITE PETROLATUM-MINERAL OIL (REFRESH LACRI-LUBE) OINT    Place 1 Application into both eyes at bedtime.  Modified Medications   No medications on file  Discontinued Medications   No medications on file    Physical Exam:  Vitals:   03/06/23 1409  BP: 110/86  Pulse: 83  Resp: 16  Temp: 97.6 F (36.4 C)  SpO2: 92%  Weight: 110 lb 6.4 oz (50.1 kg)  Height: 4\' 10"  (1.473 m)   Body mass index is 23.07 kg/m. Wt Readings from Last 3 Encounters:  03/06/23 110 lb 6.4 oz (50.1 kg)  03/05/23 111 lb 6.4 oz (50.5 kg)  12/31/22 128 lb 4.9 oz (58.2 kg)    Physical Exam Vitals reviewed.  Constitutional:      General:  She is not in acute distress. HENT:     Head: Normocephalic.     Right Ear: There is no impacted cerumen.     Left Ear: There is no impacted cerumen.  Eyes:     General:        Right eye: No discharge.        Left eye: No discharge.  Cardiovascular:     Rate and Rhythm: Normal rate. Rhythm irregular.     Pulses: Normal pulses.     Heart sounds: Normal heart sounds.  Pulmonary:     Effort: Pulmonary effort is normal.     Breath sounds: Normal breath sounds.  Abdominal:     General: Bowel sounds are normal.     Palpations: Abdomen is soft.  Skin:    General: Skin is warm.     Capillary Refill: Capillary refill takes less than 2 seconds.     Findings: Bruising present.     Comments: Small insect bite to right forearm, approx 0.5 cm, surrounded with bruising, no skin breakdown, some tenderness to right arm, no warmth.   Neurological:     General: No focal deficit present.     Mental Status: She is alert and oriented to person, place, and time.     Motor: Weakness present.     Gait: Gait abnormal.     Comments: Walker/ wheelchair  Psychiatric:        Mood and Affect: Mood normal.     Labs reviewed: Basic Metabolic Panel: Recent Labs    05/20/22 1120 06/24/22 1624 07/08/22 1454 07/08/22 1738 07/08/22 2225 10/17/22 1613 10/23/22 1233 12/26/22 0040 12/27/22 0537 12/28/22 0509  NA  --    < >  --  139   < > 141   < > 134* 131* 133*  K  --    < >  --  3.5   < > 3.7   < > 2.8* 5.1 4.2  CL  --    < >  --  106   < > 99   < > 98 99 102  CO2  --    < >  --  27   < > 35*   < > 26 26 24   GLUCOSE  --    < >  --  125*   < > 92   < > 152* 106* 110*  BUN  --    < >  --  9   < > 11   < > 18 17 19   CREATININE  --    < >  --  0.65   < > 0.66   < > 0.74 0.95 0.95  CALCIUM  --    < >  --  7.6*   < > 8.7   < > 7.7* 7.1* 7.0*  MG  --   --   --  1.8  --   --   --   --   --   --   TSH 17.58*  --  19.195*  --   --  0.24*  --   --   --   --    < > = values in this interval not displayed.    Liver Function Tests: Recent Labs    08/15/22 1139 10/17/22 1613 10/23/22 1233  AST 25 19 23   ALT 20 13 13   ALKPHOS  --   --  79  BILITOT 0.6 0.5 0.6  PROT 7.0 6.5 7.5  ALBUMIN  --   --  4.0   No results for input(s): "LIPASE", "AMYLASE" in the last 8760 hours. No results for input(s): "AMMONIA" in the last 8760 hours. CBC: Recent Labs  10/17/22 1613 10/23/22 1233 12/26/22 0040 12/27/22 0651  WBC 6.6 7.6 10.0 8.2  NEUTROABS 3,168 4.3 6.9  --   HGB 14.1 14.7 13.2 9.7*  HCT 42.4 42.8 39.1 29.3*  MCV 95.5 95.5 94.7 98.0  PLT 262 186 208 151   Lipid Panel: Recent Labs    07/09/22 0332  CHOL 207*  HDL 82  LDLCALC 108*  TRIG 84  CHOLHDL 2.5   TSH: Recent Labs    05/20/22 1120 07/08/22 1454 10/17/22 1613  TSH 17.58* 19.195* 0.24*   A1C: Lab Results  Component Value Date   HGBA1C 5.8 (H) 07/09/2022     Assessment/Plan 1. Spider bite wound, accidental or unintentional, initial encounter - bite mark on right forearm surrounded by bruising, mild tenderness - will start Keflex> no improvement > recommend doxycycline - cephALEXin (KEFLEX) 500 MG capsule; Take 1 capsule (500 mg total) by mouth 4 (four) times daily for 7 days.  Dispense: 28 capsule; Refill: 0  2. Sensorineural hearing loss (SNHL) of both ears - Ambulatory referral to Audiology  Total time: 22 minutes. Greater than 50% of total time spent doing patient education regarding insect bite and hearing loss including symptom/medication management.    Next appt: 07/10/2023  Hazle Nordmann, Juel Burrow  [facility] [phone]

## 2023-03-06 NOTE — Patient Instructions (Signed)
If spider bite not improving after staring antibiotics let me know  Referral for audiologist made  Consider seeing different provider at [facility]

## 2023-03-07 DIAGNOSIS — Z9181 History of falling: Secondary | ICD-10-CM | POA: Diagnosis not present

## 2023-03-07 DIAGNOSIS — E785 Hyperlipidemia, unspecified: Secondary | ICD-10-CM | POA: Diagnosis not present

## 2023-03-07 DIAGNOSIS — E46 Unspecified protein-calorie malnutrition: Secondary | ICD-10-CM | POA: Diagnosis not present

## 2023-03-07 DIAGNOSIS — D649 Anemia, unspecified: Secondary | ICD-10-CM | POA: Diagnosis not present

## 2023-03-07 DIAGNOSIS — I110 Hypertensive heart disease with heart failure: Secondary | ICD-10-CM | POA: Diagnosis not present

## 2023-03-07 DIAGNOSIS — I480 Paroxysmal atrial fibrillation: Secondary | ICD-10-CM | POA: Diagnosis not present

## 2023-03-07 DIAGNOSIS — Z7901 Long term (current) use of anticoagulants: Secondary | ICD-10-CM | POA: Diagnosis not present

## 2023-03-07 DIAGNOSIS — Z79891 Long term (current) use of opiate analgesic: Secondary | ICD-10-CM | POA: Diagnosis not present

## 2023-03-07 DIAGNOSIS — I5032 Chronic diastolic (congestive) heart failure: Secondary | ICD-10-CM | POA: Diagnosis not present

## 2023-03-07 DIAGNOSIS — E039 Hypothyroidism, unspecified: Secondary | ICD-10-CM | POA: Diagnosis not present

## 2023-03-07 DIAGNOSIS — S32502D Unspecified fracture of left pubis, subsequent encounter for fracture with routine healing: Secondary | ICD-10-CM | POA: Diagnosis not present

## 2023-03-10 ENCOUNTER — Encounter: Payer: Self-pay | Admitting: Orthopedic Surgery

## 2023-03-10 ENCOUNTER — Other Ambulatory Visit: Payer: Self-pay | Admitting: Orthopedic Surgery

## 2023-03-10 DIAGNOSIS — D649 Anemia, unspecified: Secondary | ICD-10-CM | POA: Diagnosis not present

## 2023-03-10 DIAGNOSIS — E785 Hyperlipidemia, unspecified: Secondary | ICD-10-CM | POA: Diagnosis not present

## 2023-03-10 DIAGNOSIS — E46 Unspecified protein-calorie malnutrition: Secondary | ICD-10-CM | POA: Diagnosis not present

## 2023-03-10 DIAGNOSIS — Z9181 History of falling: Secondary | ICD-10-CM | POA: Diagnosis not present

## 2023-03-10 DIAGNOSIS — I480 Paroxysmal atrial fibrillation: Secondary | ICD-10-CM | POA: Diagnosis not present

## 2023-03-10 DIAGNOSIS — I110 Hypertensive heart disease with heart failure: Secondary | ICD-10-CM | POA: Diagnosis not present

## 2023-03-10 DIAGNOSIS — I5032 Chronic diastolic (congestive) heart failure: Secondary | ICD-10-CM | POA: Diagnosis not present

## 2023-03-10 DIAGNOSIS — S32502D Unspecified fracture of left pubis, subsequent encounter for fracture with routine healing: Secondary | ICD-10-CM | POA: Diagnosis not present

## 2023-03-10 DIAGNOSIS — E039 Hypothyroidism, unspecified: Secondary | ICD-10-CM | POA: Diagnosis not present

## 2023-03-10 DIAGNOSIS — Z79891 Long term (current) use of opiate analgesic: Secondary | ICD-10-CM | POA: Diagnosis not present

## 2023-03-10 DIAGNOSIS — Z7901 Long term (current) use of anticoagulants: Secondary | ICD-10-CM | POA: Diagnosis not present

## 2023-03-10 DIAGNOSIS — T63301A Toxic effect of unspecified spider venom, accidental (unintentional), initial encounter: Secondary | ICD-10-CM

## 2023-03-10 MED ORDER — DOXYCYCLINE HYCLATE 100 MG PO TABS: 100.0000 mg | ORAL_TABLET | Freq: Two times a day (BID) | ORAL | 0 refills | Status: AC

## 2023-03-11 DIAGNOSIS — S32502D Unspecified fracture of left pubis, subsequent encounter for fracture with routine healing: Secondary | ICD-10-CM | POA: Diagnosis not present

## 2023-03-11 DIAGNOSIS — E039 Hypothyroidism, unspecified: Secondary | ICD-10-CM | POA: Diagnosis not present

## 2023-03-11 DIAGNOSIS — Z79891 Long term (current) use of opiate analgesic: Secondary | ICD-10-CM | POA: Diagnosis not present

## 2023-03-11 DIAGNOSIS — D649 Anemia, unspecified: Secondary | ICD-10-CM | POA: Diagnosis not present

## 2023-03-11 DIAGNOSIS — Z9181 History of falling: Secondary | ICD-10-CM | POA: Diagnosis not present

## 2023-03-11 DIAGNOSIS — I5032 Chronic diastolic (congestive) heart failure: Secondary | ICD-10-CM | POA: Diagnosis not present

## 2023-03-11 DIAGNOSIS — I110 Hypertensive heart disease with heart failure: Secondary | ICD-10-CM | POA: Diagnosis not present

## 2023-03-11 DIAGNOSIS — E46 Unspecified protein-calorie malnutrition: Secondary | ICD-10-CM | POA: Diagnosis not present

## 2023-03-11 DIAGNOSIS — E785 Hyperlipidemia, unspecified: Secondary | ICD-10-CM | POA: Diagnosis not present

## 2023-03-11 DIAGNOSIS — I480 Paroxysmal atrial fibrillation: Secondary | ICD-10-CM | POA: Diagnosis not present

## 2023-03-11 DIAGNOSIS — Z7901 Long term (current) use of anticoagulants: Secondary | ICD-10-CM | POA: Diagnosis not present

## 2023-03-12 ENCOUNTER — Emergency Department (HOSPITAL_COMMUNITY)
Admission: EM | Admit: 2023-03-12 | Discharge: 2023-03-12 | Disposition: A | Discharge status: Home / Self Care | Payer: Medicare Other | Attending: Emergency Medicine | Admitting: Emergency Medicine

## 2023-03-12 ENCOUNTER — Emergency Department (HOSPITAL_COMMUNITY): Payer: Medicare Other

## 2023-03-12 ENCOUNTER — Encounter (HOSPITAL_COMMUNITY): Payer: Self-pay

## 2023-03-12 DIAGNOSIS — E039 Hypothyroidism, unspecified: Secondary | ICD-10-CM | POA: Diagnosis not present

## 2023-03-12 DIAGNOSIS — E876 Hypokalemia: Secondary | ICD-10-CM | POA: Diagnosis not present

## 2023-03-12 DIAGNOSIS — Z7901 Long term (current) use of anticoagulants: Secondary | ICD-10-CM | POA: Diagnosis not present

## 2023-03-12 DIAGNOSIS — R519 Headache, unspecified: Secondary | ICD-10-CM

## 2023-03-12 DIAGNOSIS — I10 Essential (primary) hypertension: Secondary | ICD-10-CM | POA: Diagnosis not present

## 2023-03-12 DIAGNOSIS — I517 Cardiomegaly: Secondary | ICD-10-CM | POA: Diagnosis not present

## 2023-03-12 DIAGNOSIS — J449 Chronic obstructive pulmonary disease, unspecified: Secondary | ICD-10-CM | POA: Diagnosis not present

## 2023-03-12 DIAGNOSIS — R079 Chest pain, unspecified: Secondary | ICD-10-CM | POA: Diagnosis not present

## 2023-03-12 DIAGNOSIS — Z79890 Hormone replacement therapy (postmenopausal): Secondary | ICD-10-CM | POA: Diagnosis not present

## 2023-03-12 DIAGNOSIS — H353211 Exudative age-related macular degeneration, right eye, with active choroidal neovascularization: Secondary | ICD-10-CM | POA: Diagnosis not present

## 2023-03-12 LAB — CBC
HCT: 42.6 % (ref 36.0–46.0)
Hemoglobin: 14 g/dL (ref 12.0–15.0)
MCH: 32.9 pg (ref 26.0–34.0)
MCHC: 32.9 g/dL (ref 30.0–36.0)
MCV: 100.2 fL — ABNORMAL HIGH (ref 80.0–100.0)
Platelets: 219 10*3/uL (ref 150–400)
RBC: 4.25 MIL/uL (ref 3.87–5.11)
RDW: 14.2 % (ref 11.5–15.5)
WBC: 5.3 10*3/uL (ref 4.0–10.5)
nRBC: 0 % (ref 0.0–0.2)

## 2023-03-12 LAB — BASIC METABOLIC PANEL
Anion gap: 12 (ref 5–15)
BUN: 19 mg/dL (ref 8–23)
CO2: 28 mmol/L (ref 22–32)
Calcium: 8.8 mg/dL — ABNORMAL LOW (ref 8.9–10.3)
Chloride: 96 mmol/L — ABNORMAL LOW (ref 98–111)
Creatinine, Ser: 0.6 mg/dL (ref 0.44–1.00)
GFR, Estimated: >60 mL/min (ref >60)
Glucose, Bld: 99 mg/dL (ref 70–99)
Potassium: 3.1 mmol/L — ABNORMAL LOW (ref 3.5–5.1)
Sodium: 136 mmol/L (ref 135–145)

## 2023-03-12 LAB — HEPATIC FUNCTION PANEL
ALT: 25 U/L (ref 0–44)
AST: 33 U/L (ref 15–41)
Albumin: 3.9 g/dL (ref 3.5–5.0)
Alkaline Phosphatase: 88 U/L (ref 38–126)
Bilirubin, Direct: 0.2 mg/dL (ref 0.0–0.2)
Indirect Bilirubin: 0.6 mg/dL (ref 0.3–0.9)
Total Bilirubin: 0.8 mg/dL (ref 0.3–1.2)
Total Protein: 7 g/dL (ref 6.5–8.1)

## 2023-03-12 LAB — TROPONIN I (HIGH SENSITIVITY): Troponin I (High Sensitivity): 9 ng/L (ref <18)

## 2023-03-12 MED ORDER — LACTATED RINGERS IV BOLUS
1000.0000 mL | Freq: Once | INTRAVENOUS | Status: AC
  Administered 2023-03-12: 1000 mL via INTRAVENOUS

## 2023-03-12 MED ORDER — ACETAMINOPHEN 325 MG PO TABS
650.0000 mg | ORAL_TABLET | Freq: Once | ORAL | Status: AC
  Administered 2023-03-12: 650 mg via ORAL
  Filled 2023-03-12: qty 2

## 2023-03-12 MED ORDER — POTASSIUM CHLORIDE CRYS ER 20 MEQ PO TBCR
40.0000 meq | EXTENDED_RELEASE_TABLET | Freq: Once | ORAL | Status: AC
  Administered 2023-03-12: 40 meq via ORAL
  Filled 2023-03-12: qty 2

## 2023-03-12 NOTE — ED Triage Notes (Signed)
Pt states that BP has been high today, having headache today and feel like her heart is pounding. Hx of afib and stroke

## 2023-03-12 NOTE — ED Provider Notes (Signed)
[place] EMERGENCY DEPARTMENT AT [facility] Provider Note   CSN: [id number] Arrival date & time: 03/12/23  1934     History  Chief Complaint  Patient presents with   Hypertension   Chest Pain    Kristen Whitehead is a 89 y.o. female.  Patient is a 89 year old female with a past medical history of A-fib on Eliquis, hypertension and hypothyroidism presenting to the emergency department with high blood pressure, palpitations and headache.  Patient reports that she has been treated for a spider bite on her right forearm.  She states that yesterday her primary doctor switched her antibiotics and when she woke up today she was feeling unwell.  She states that she had a mild occipital headache and worsening of her left-sided neck pain.  She states that she felt like she was having heart palpitations.  She denies any chest pain or shortness of breath, fevers or chills.  She denies any nausea or vomiting.  She denies any new vision changes, numbness or weakness.  She states that she checked her blood pressure today and it was significantly elevated over 200 systolic.  She states that when she had her blood pressure checked by her primary yesterday it was in the 110s.  She denies any other recent medication changes and has been taking her medications as prescribed.  The history is provided by the patient and a relative.  Hypertension Associated symptoms include chest pain.  Chest Pain      Home Medications Prior to Admission medications   Medication Sig Start Date End Date Taking? Authorizing Provider  acetaminophen (TYLENOL) 500 MG tablet Take 2 tablets (1,000 mg total) by mouth every 8 (eight) hours as needed. Patient taking differently: Take 500 mg by mouth every 8 (eight) hours as needed for mild pain or headache. 09/20/22  Yes Medina-Vargas, Monina C, NP  atenolol (TENORMIN) 25 MG tablet Take 25 mg by mouth every morning.   Yes [provider]  chlorhexidine  (PERIDEX) 0.12 % solution Use as directed 15 mLs in the mouth or throat daily. 12/16/22  Yes [provider]  docusate sodium (COLACE) 100 MG capsule Take 100 mg by mouth daily.   Yes [provider]  dorzolamide (TRUSOPT) 2 % ophthalmic solution Place 1 drop into the left eye 2 (two) times daily. 09/24/19  Yes [provider]  doxycycline (VIBRA-TABS) 100 MG tablet Take 1 tablet (100 mg total) by mouth 2 (two) times daily for 7 days. 03/10/23 03/17/23 Yes Fargo, Amy E, NP  ELIQUIS 2.5 MG TABS tablet TAKE 1 TABLET(2.5 MG) BY MOUTH TWICE DAILY Patient taking differently: Take 2.5 mg by mouth 2 (two) times daily. 12/09/22  Yes O'Neal, Ronnald Ramp, MD  ezetimibe (ZETIA) 10 MG tablet Take 1 tablet (10 mg total) by mouth daily. Patient taking differently: Take 10 mg by mouth at bedtime. 08/02/22  Yes O'Neal, Ronnald Ramp, MD  furosemide (LASIX) 40 MG tablet Take 1 tablet (40 mg total) by mouth daily. Patient taking differently: Take 40 mg by mouth in the morning. 08/02/22 07/28/23 Yes O'Neal, Ronnald Ramp, MD  latanoprost (XALATAN) 0.005 % ophthalmic solution Place 1 drop into both eyes at bedtime.  02/23/13  Yes [provider]  levothyroxine (SYNTHROID) 50 MCG tablet Take 1 tablet (50 mcg total) by mouth daily. Patient taking differently: Take 50 mcg by mouth daily before lunch. 10/20/22  Yes Medina-Vargas, Monina C, NP  nitroGLYCERIN (NITROSTAT) 0.4 MG SL tablet DISSOLVE 1 TABLET UNDER THE TONGUE  EVERY 5 MINUTES FOR 3 DOSES AS NEEDED  FOR  CHEST  PAIN Patient taking differently: Place 0.4 mg under the tongue every 5 (five) minutes x 3 doses as needed for chest pain. 12/23/18  Yes Chilton Si, MD  PREMARIN vaginal cream Place 2 g vaginally 2 (two) times a week. Uses on Sunday and wednesday 03/25/18  Yes [provider]  RHOPRESSA 0.02 % SOLN Place 1 drop into the right eye every morning. 12/11/22  Yes [provider]  temazepam (RESTORIL) 15 MG capsule  Take 1 capsule (15 mg total) by mouth at bedtime. 12/31/22  Yes [name], MD  Multiple Vitamin (MULTIVITAMIN WITH MINERALS) TABS tablet Take 1 tablet by mouth daily. Patient not taking: Reported on 03/12/2023 07/16/22   Lynnae January, NP  neomycin-polymyxin-dexamethasone (MAXITROL) 0.1 % ophthalmic suspension Place 1 drop into both eyes 4 (four) times daily.    [provider]  omeprazole (PRILOSEC) 20 MG capsule Take 1 capsule (20 mg total) by mouth daily. Patient not taking: Reported on 03/12/2023 02/06/23   [name], NP  oxyCODONE (OXY IR/ROXICODONE) 5 MG immediate release tablet Take 0.5 tablets (2.5 mg total) by mouth every 6 (six) hours as needed for moderate pain. Patient not taking: Reported on 03/12/2023 12/31/22   [name], MD  Polyvinyl Alcohol-Povidone (REFRESH OP) Place 2 drops into both eyes daily as needed (dry eye).    [provider]  White Petrolatum-Mineral Oil (REFRESH LACRI-LUBE) OINT Place 1 Application into both eyes at bedtime.    [provider]      Allergies    Azithromycin, Compazine [prochlorperazine edisylate], Demerol, Dolophine [methadone], Ebastine, Erythromycin, Gluten meal, Ibuprofen, Meperidine hcl, Morphine sulfate, Other, Statins, Tetanus toxoids, and Tetracyclines & related    Review of Systems   Review of Systems  Cardiovascular:  Positive for chest pain.    Physical Exam Updated Vital Signs BP (!) 171/107   Pulse 72   Temp 98 F (36.7 C) (Oral)   Resp 15   Ht 4\' 10"  (1.473 m)   Wt 49.9 kg   SpO2 96%   BMI 22.99 kg/m  Physical Exam Vitals and nursing note reviewed.  Constitutional:      General: She is not in acute distress.    Appearance: She is well-developed.  HENT:     Head: Normocephalic and atraumatic.  Eyes:     Extraocular Movements: Extraocular movements intact.     Pupils: Pupils are equal, round, and reactive to light.  Cardiovascular:     Rate and Rhythm: Normal rate. Rhythm irregular.      Pulses:          Radial pulses are 2+ on the right side and 2+ on the left side.     Heart sounds: Normal heart sounds.  Pulmonary:     Effort: Pulmonary effort is normal.     Breath sounds: Normal breath sounds.  Abdominal:     Palpations: Abdomen is soft.     Tenderness: There is no abdominal tenderness.  Musculoskeletal:        General: Normal range of motion.     Cervical back: Normal range of motion and neck supple.     Right lower leg: No tenderness.     Left lower leg: No tenderness.  Skin:    General: Skin is warm and dry.  Neurological:     General: No focal deficit present.     Mental Status: She is alert and oriented  to person, place, and time.     Cranial Nerves: No cranial nerve deficit, dysarthria or facial asymmetry.     Sensory: Sensation is intact.     Motor: Motor function is intact.     Coordination: Coordination is intact.  Psychiatric:        Mood and Affect: Mood normal.        Behavior: Behavior normal.     ED Results / Procedures / Treatments   Labs (all labs ordered are listed, but only abnormal results are displayed) Labs Reviewed  BASIC METABOLIC PANEL - Abnormal; Notable for the following components:      Result Value   Potassium 3.1 (*)    Chloride 96 (*)    Calcium 8.8 (*)    All other components within normal limits  CBC - Abnormal; Notable for the following components:   MCV 100.2 (*)    All other components within normal limits  HEPATIC FUNCTION PANEL  TROPONIN I (HIGH SENSITIVITY)    EKG EKG Interpretation Date/Time:  Wednesday March 12 2023 20:13:52 EDT Ventricular Rate:  72 PR Interval:    QRS Duration:  72 QT Interval:  400 QTC Calculation: 438 R Axis:   -18  Text Interpretation: Atrial fibrillation Ventricular premature complex Borderline left axis deviation Probable anteroseptal infarct, old Since last tracing of earlier today No significant change was found Confirmed by Elayne Snare ([id number]) on 03/12/2023 10:03:12  PM  Radiology CT Head Wo Contrast  Result Date: 03/12/2023 CLINICAL DATA:  Headache, new onset (Age >= 51y) EXAM: CT HEAD WITHOUT CONTRAST TECHNIQUE: Contiguous axial images were obtained from the base of the skull through the vertex without intravenous contrast. RADIATION DOSE REDUCTION: This exam was performed according to the departmental dose-optimization program which includes automated exposure control, adjustment of the mA and/or kV according to patient size and/or use of iterative reconstruction technique. COMPARISON:  None Available. FINDINGS: Brain: There is atrophy and chronic small vessel disease changes. No acute intracranial abnormality. Specifically, no hemorrhage, hydrocephalus, mass lesion, acute infarction, or significant intracranial injury. Vascular: No hyperdense vessel or unexpected calcification. Skull: No acute calvarial abnormality. Sinuses/Orbits: No acute findings Other: None IMPRESSION: Atrophy, chronic microvascular disease. No acute intracranial abnormality. Electronically Signed   By: Charlett Nose M.D.   On: 03/12/2023 20:53   DG Chest 2 View  Result Date: 03/12/2023 CLINICAL DATA:  Chest pain, hypertension EXAM: CHEST - 2 VIEW COMPARISON:  Previous studies including the examination of 12/26/2022 FINDINGS: Transverse diameter of heart is increased. Increase in the AP diameter of chest suggests COPD. There are no signs of pulmonary edema or focal pulmonary consolidation. There is no pleural effusion or pneumothorax. Surgical clips are seen in thyroid bed. IMPRESSION: Cardiomegaly. COPD. There are no signs of pulmonary edema or focal pulmonary consolidation. Electronically Signed   By: Ernie Avena M.D.   On: 03/12/2023 20:46    Procedures Procedures    Medications Ordered in ED Medications  acetaminophen (TYLENOL) tablet 650 mg (650 mg Oral Given 03/12/23 2112)  lactated ringers bolus 1,000 mL (1,000 mLs Intravenous New Bag/Given 03/12/23 2114)  potassium  chloride SA (KLOR-CON M) CR tablet 40 mEq (40 mEq Oral Given 03/12/23 2231)    ED Course/ Medical Decision Making/ A&P Clinical Course as of 03/12/23 2243  Wed Mar 12, 2023  2202 Symptoms ongoing all day and initial troponin is negative, single troponin is sufficient. Mild hypokalemia will be repleted. No abnormalities on CTH. [VK]    Clinical Course User Index [  VK] Rexford Maus, DO                             Medical Decision Making This patient presents to the ED with chief complaint(s) of palpitations, HTN with pertinent past medical history of HTN, a fib on Eliquis, prior CVA without residual deficits, hypothyroidism which further complicates the presenting complaint. The complaint involves an extensive differential diagnosis and also carries with it a high risk of complications and morbidity.    The differential diagnosis includes due to patient's age with new onset headache concern for possible ICH, mass effect, considering dehydration, electrolyte abnormality, arrhythmia, anemia, no signs of worsening cellulitis or skin infection on exam  Additional history obtained: Additional history obtained from family Records reviewed Primary Care Documents and outpatient cardiology records  ED Course and Reassessment: On patient's arrival to the emergency department she was hypertensive to the 170s and otherwise hemodynamically stable in no acute distress.  EKG was performed and showed rate controlled atrial fibrillation without acute ischemic changes.  Patient will have labs and head CT and she will be given Tylenol and fluids for her headache and will be closely reassessed.  Independent labs interpretation:  The following labs were independently interpreted: Mild hypokalemia otherwise within normal range  Independent visualization of imaging: - I independently visualized the following imaging with scope of interpretation limited to determining acute life threatening conditions  related to emergency care: Chest x-ray, CT head, which revealed no acute disease  Consultation: - Consulted or discussed management/test interpretation w/ external professional: N/A  Consideration for admission or further workup: Patient has no emergent conditions requiring admission or further work-up at this time and is stable for discharge home with primary care follow-up  Social Determinants of health: N/A    Amount and/or Complexity of Data Reviewed Labs: ordered. Radiology: ordered.  Risk OTC drugs. Prescription drug management.          Final Clinical Impression(s) / ED Diagnoses Final diagnoses:  Hypertension, unspecified type  Acute nonintractable headache, unspecified headache type  Hypokalemia    Rx / DC Orders ED Discharge Orders     None         Rexford Maus, DO 03/12/23 2243

## 2023-03-12 NOTE — Discharge Instructions (Signed)
You were seen in the emergency department for your high blood pressure and your headache.  You had no signs of abnormality within your brain and your workup showed that your potassium level was slightly low but was otherwise normal.  You are in atrial fibrillation but you are in a normal heart rate.  It is unclear why your blood pressure was high today but you can continue to check your blood pressure daily and I recommend taking it at the same time every day when you have been resting for at least 15 minutes to get an accurate reading and see if your blood pressures are consistently running high to determine if you need any changes to your medication.  You can follow-up with your primary doctor in the next few days to have your symptoms rechecked.  You should return to the emergency department for significantly worsening headaches, repetitive vomiting, numbness or weakness on one side of the body compared to the other or any other new or concerning symptoms.

## 2023-03-13 ENCOUNTER — Other Ambulatory Visit: Payer: Self-pay | Admitting: Orthopedic Surgery

## 2023-03-13 ENCOUNTER — Telehealth: Payer: Self-pay

## 2023-03-13 MED ORDER — ATENOLOL 25 MG PO TABS: 37.5000 mg | ORAL_TABLET | Freq: Every morning | ORAL | 1 refills | Status: DC

## 2023-03-13 NOTE — Telephone Encounter (Signed)
Patient called stating that she spent most of the night at the hospital. She states that her blood pressure and pulse were elevated. She says this morning her BP was 159*97 and her pulse was 109. She says a few minutes after taking these readings she went to the bathroom which is close to her room, when she got back in the bed she took her vital a  again and states that blood pressure remained the same,but her pulse went up to 129. She was told by the hospital to call our office this morning to see what she should do. Please advise.  Message sent to Hazle Nordmann, NP

## 2023-03-13 NOTE — Telephone Encounter (Signed)
I spoke with patient's granddaughter and she verbalized her understanding and agreed.

## 2023-03-13 NOTE — Telephone Encounter (Signed)
Recommend increasing atenolol to 37.5 mg (1.5 tablets) every morning. Check bp every morning> 2 hours AFTER taking blood pressure medications. Also take blood pressure once in evening.

## 2023-03-14 ENCOUNTER — Telehealth: Payer: Self-pay

## 2023-03-14 NOTE — Telephone Encounter (Signed)
Discussed arm with Toniann Fail, patients granddaughter/caregiver. Advised to continue doxycycline. Apply heat compress for 20 minutes 2-3 times daily. She is on a blood thinner and advanced age. These types of injuries take time to heal. We are scheduled for video visit 07/29.

## 2023-03-14 NOTE — Telephone Encounter (Signed)
Spoke with patient and response from Hazle Nordmann, NP discussed. Patient seemed to verbalize her understandiing.

## 2023-03-14 NOTE — Telephone Encounter (Signed)
This encounter was created in error - please disregard.

## 2023-03-14 NOTE — Telephone Encounter (Signed)
Patient had a friend call about the area on her arm. She states that the area is not getting any better. The area is black and blue. Patient is not feeling well she states that she is having hot and cold flashes. Please advise.  Message sent to Hazle Nordmann, NP

## 2023-03-17 ENCOUNTER — Telehealth (INDEPENDENT_AMBULATORY_CARE_PROVIDER_SITE_OTHER): Payer: Medicare Other | Admitting: Orthopedic Surgery

## 2023-03-17 ENCOUNTER — Encounter: Payer: Self-pay | Admitting: Orthopedic Surgery

## 2023-03-17 VITALS — BP 148/60 | HR 70

## 2023-03-17 DIAGNOSIS — E039 Hypothyroidism, unspecified: Secondary | ICD-10-CM | POA: Diagnosis not present

## 2023-03-17 DIAGNOSIS — E46 Unspecified protein-calorie malnutrition: Secondary | ICD-10-CM | POA: Diagnosis not present

## 2023-03-17 DIAGNOSIS — Z9181 History of falling: Secondary | ICD-10-CM | POA: Diagnosis not present

## 2023-03-17 DIAGNOSIS — I480 Paroxysmal atrial fibrillation: Secondary | ICD-10-CM | POA: Diagnosis not present

## 2023-03-17 DIAGNOSIS — I10 Essential (primary) hypertension: Secondary | ICD-10-CM | POA: Diagnosis not present

## 2023-03-17 DIAGNOSIS — S32502D Unspecified fracture of left pubis, subsequent encounter for fracture with routine healing: Secondary | ICD-10-CM | POA: Diagnosis not present

## 2023-03-17 DIAGNOSIS — T63301D Toxic effect of unspecified spider venom, accidental (unintentional), subsequent encounter: Secondary | ICD-10-CM | POA: Diagnosis not present

## 2023-03-17 DIAGNOSIS — E785 Hyperlipidemia, unspecified: Secondary | ICD-10-CM | POA: Diagnosis not present

## 2023-03-17 DIAGNOSIS — I110 Hypertensive heart disease with heart failure: Secondary | ICD-10-CM | POA: Diagnosis not present

## 2023-03-17 DIAGNOSIS — Z8781 Personal history of (healed) traumatic fracture: Secondary | ICD-10-CM | POA: Diagnosis not present

## 2023-03-17 DIAGNOSIS — Z7901 Long term (current) use of anticoagulants: Secondary | ICD-10-CM | POA: Diagnosis not present

## 2023-03-17 DIAGNOSIS — I5032 Chronic diastolic (congestive) heart failure: Secondary | ICD-10-CM | POA: Diagnosis not present

## 2023-03-17 DIAGNOSIS — Z79891 Long term (current) use of opiate analgesic: Secondary | ICD-10-CM | POA: Diagnosis not present

## 2023-03-17 DIAGNOSIS — D649 Anemia, unspecified: Secondary | ICD-10-CM | POA: Diagnosis not present

## 2023-03-17 MED ORDER — ATENOLOL 25 MG PO TABS
25.0000 mg | ORAL_TABLET | Freq: Every morning | ORAL | 1 refills | Status: DC
Start: 2023-03-17 — End: 2023-03-25

## 2023-03-17 NOTE — Progress Notes (Signed)
Careteam: Patient Care Team: Kristen Heir, NP as PCP - General (Adult Health Nurse Practitioner) O'Neal, Ronnald Ramp, MD as PCP - Cardiology (Cardiology) Clarene Duke, Karma Lew, RN as [facility] Care Management O'Neal, Ronnald Ramp, MD as Consulting Physician (Cardiology)  Seen by: Hazle Nordmann, AGNP-C  PLACE OF SERVICE:  [facility]  Advanced Directive information Does Patient Have a Medical Advance Directive?: Yes, Type of Advance Directive: Healthcare Power of [place];Living will;Out of facility DNR (pink MOST or yellow form), Does patient want to make changes to medical advance directive?: No - Patient declined  Allergies  Allergen Reactions   Azithromycin Nausea Only   Compazine [Prochlorperazine Edisylate] Nausea Only   Demerol Nausea Only   Dolophine [Methadone] Nausea Only   Ebastine Nausea Only    EBS   Erythromycin Nausea Only   Gluten Meal Diarrhea and Other (See Comments)    Has Celiac Disease   Ibuprofen Nausea Only   Meperidine Hcl Other (See Comments)    Reaction not noted   Morphine Sulfate Itching   Other Diarrhea and Other (See Comments)    CELIAC DISEASE   Statins Other (See Comments)    Myalgias    Tetanus Toxoids Nausea Only   Tetracyclines & Related Nausea Only    Chief Complaint  Patient presents with   Acute Visit    Power mobility chair/arm check.     HPI: Patient is a 89 y.o. female seen today via video visit due to unstable gait.   H/o left pelvic fracture 12/26/2022. She was discharged to [facility] skilled nursing facility for about 3 weeks for additional PT/OT. She is now home with granddaughter who is caregiver. She is receiving HH PT/OT. She is now ambulating with wheelchair. 1 person assist with transfers. No recent falls. OT has recommended power wheelchair. Tylenol effective in reducing breakthrough pain.   07/18 seen for suspected spider bite. Keflex started but switched to doxycycline due to ineffectiveness. Bruising has  improved. She continues to have small raised bump to right forearm. Pain to area has improved. She has 2 days of doxycycline left to complete. Afebrile.   07/24 ED visit due to elevated blood pressure. No changes to medication. She was advised to f/u with PCP. Advised to try 37.5 (1.5 tablets) of atenolol. Pill is hard to cut/score. They have been taking home pressures, most < 150/90. Denies chest pain, headaches, blurred vision or sob. Granddaughter/caregiver believe she had increased anxiety during this time. She had other family members taking care of her last week.   Review of Systems:  Review of Systems  Constitutional:  Negative for fever and malaise/fatigue.  Respiratory:  Negative for cough, shortness of breath and wheezing.   Cardiovascular:  Negative for chest pain and leg swelling.  Musculoskeletal:  Positive for back pain and joint pain. Negative for falls.       Unstable gait  Neurological:  Positive for weakness. Negative for dizziness and headaches.  Psychiatric/Behavioral:  Negative for depression. The patient is nervous/anxious and has insomnia.     Past Medical History:  Diagnosis Date   Acute on chronic diastolic CHF (congestive heart failure) (HCC) 02/01/2014   Bladder prolapse, female, acquired    Cancer [facility])    thyroid   Chest pain    no known ischemic heart disease; negative Myoview July 2013. EF 75% with no ischemia.    Chronic anticoagulation    Chronic atrial fibrillation (HCC)    managed with rate control and coumadin  Chronic diastolic heart failure (HCC)    GERD (gastroesophageal reflux disease)    Heart disease    History of congenital mitral regurgitation    mild   History of heart attack    Multiple    History of mitral valve prolapse 06/15/2008   a. echo 1/14: mild LVH, EF 60-65%, mild MR, mild to mod BAE, PASP 35   Hypercholesterolemia    Hypertension    Hypothyroidism    Stroke [facility])    Past Surgical History:  Procedure Laterality Date    ABDOMINAL HYSTERECTOMY     CATARACT EXTRACTION     CHOLECYSTECTOMY     Thyroidectomy     TONSILLECTOMY     Social History:   reports that she has never smoked. She has never used smokeless tobacco. She reports that she does not drink alcohol and does not use drugs.  Family History  Problem Relation Age of Onset   Stroke Mother 23   Stroke Father 37   Arthritis Daughter    Gout Daughter    Immunocompromised Granddaughter    Heart attack Neg Hx    Heart disease Neg Hx     Medications: Patient's Medications  New Prescriptions   No medications on file  Previous Medications   ACETAMINOPHEN (TYLENOL) 500 MG TABLET    Take 2 tablets (1,000 mg total) by mouth every 8 (eight) hours as needed.   ATENOLOL (TENORMIN) 25 MG TABLET    Take 1.5 tablets (37.5 mg total) by mouth every morning.   CHLORHEXIDINE (PERIDEX) 0.12 % SOLUTION    Use as directed 15 mLs in the mouth or throat daily.   DOCUSATE SODIUM (COLACE) 100 MG CAPSULE    Take 100 mg by mouth daily.   DORZOLAMIDE (TRUSOPT) 2 % OPHTHALMIC SOLUTION    Place 1 drop into the left eye 2 (two) times daily.   DOXYCYCLINE (VIBRA-TABS) 100 MG TABLET    Take 1 tablet (100 mg total) by mouth 2 (two) times daily for 7 days.   ELIQUIS 2.5 MG TABS TABLET    TAKE 1 TABLET(2.5 MG) BY MOUTH TWICE DAILY   EZETIMIBE (ZETIA) 10 MG TABLET    Take 1 tablet (10 mg total) by mouth daily.   FUROSEMIDE (LASIX) 40 MG TABLET    Take 1 tablet (40 mg total) by mouth daily.   LATANOPROST (XALATAN) 0.005 % OPHTHALMIC SOLUTION    Place 1 drop into both eyes at bedtime.    LEVOTHYROXINE (SYNTHROID) 50 MCG TABLET    Take 1 tablet (50 mcg total) by mouth daily.   MULTIPLE VITAMIN (MULTIVITAMIN WITH MINERALS) TABS TABLET    Take 1 tablet by mouth daily.   NITROGLYCERIN (NITROSTAT) 0.4 MG SL TABLET    DISSOLVE 1 TABLET UNDER THE TONGUE EVERY 5 MINUTES FOR 3 DOSES AS NEEDED  FOR  CHEST  PAIN   OMEPRAZOLE (PRILOSEC) 20 MG CAPSULE    Take 1 capsule (20 mg total) by mouth  daily.   POLYVINYL ALCOHOL-POVIDONE (REFRESH OP)    Place 2 drops into both eyes daily as needed (dry eye).   PREMARIN VAGINAL CREAM    Place 2 g vaginally 2 (two) times a week. Uses on Sunday and wednesday   RHOPRESSA 0.02 % SOLN    Place 1 drop into the right eye every morning.   TEMAZEPAM (RESTORIL) 15 MG CAPSULE    Take 1 capsule (15 mg total) by mouth at bedtime.   WHITE PETROLATUM-MINERAL OIL (REFRESH LACRI-LUBE) OINT  Place 1 Application into both eyes at bedtime.  Modified Medications   No medications on file  Discontinued Medications   NEOMYCIN-POLYMYXIN-DEXAMETHASONE (MAXITROL) 0.1 % OPHTHALMIC SUSPENSION    Place 1 drop into both eyes 4 (four) times daily.   OXYCODONE (OXY IR/ROXICODONE) 5 MG IMMEDIATE RELEASE TABLET    Take 0.5 tablets (2.5 mg total) by mouth every 6 (six) hours as needed for moderate pain.    Physical Exam:  There were no vitals filed for this visit. There is no height or weight on file to calculate BMI. Wt Readings from Last 3 Encounters:  03/12/23 110 lb (49.9 kg)  03/06/23 110 lb 6.4 oz (50.1 kg)  03/05/23 111 lb 6.4 oz (50.5 kg)    Physical Exam Vitals (exam limited due to video visit) reviewed.  Constitutional:      General: She is not in acute distress. Skin:    Comments: Right arm appears less bruised, small raised bump noted to right forearm  Neurological:     Mental Status: She is alert.     Labs reviewed: Basic Metabolic Panel: Recent Labs    05/20/22 1120 06/24/22 1624 07/08/22 1454 07/08/22 1738 07/08/22 2225 10/17/22 1613 10/23/22 1233 12/27/22 0537 12/28/22 0509 03/12/23 2107  NA  --    < >  --  139   < > 141   < > 131* 133* 136  K  --    < >  --  3.5   < > 3.7   < > 5.1 4.2 3.1*  CL  --    < >  --  106   < > 99   < > 99 102 96*  CO2  --    < >  --  27   < > 35*   < > 26 24 28   GLUCOSE  --    < >  --  125*   < > 92   < > 106* 110* 99  BUN  --    < >  --  9   < > 11   < > 17 19 19   CREATININE  --    < >  --  0.65   < >  0.66   < > 0.95 0.95 0.60  CALCIUM  --    < >  --  7.6*   < > 8.7   < > 7.1* 7.0* 8.8*  MG  --   --   --  1.8  --   --   --   --   --   --   TSH 17.58*  --  19.195*  --   --  0.24*  --   --   --   --    < > = values in this interval not displayed.   Liver Function Tests: Recent Labs    10/17/22 1613 10/23/22 1233 03/12/23 2107  AST 19 23 33  ALT 13 13 25   ALKPHOS  --  79 88  BILITOT 0.5 0.6 0.8  PROT 6.5 7.5 7.0  ALBUMIN  --  4.0 3.9   No results for input(s): "LIPASE", "AMYLASE" in the last 8760 hours. No results for input(s): "AMMONIA" in the last 8760 hours. CBC: Recent Labs    10/17/22 1613 10/23/22 1233 12/26/22 0040 12/27/22 0651 03/12/23 2107  WBC 6.6 7.6 10.0 8.2 5.3  NEUTROABS 3,168 4.3 6.9  --   --   HGB 14.1 14.7 13.2 9.7* 14.0  HCT 42.4 42.8 39.1  29.3* 42.6  MCV 95.5 95.5 94.7 98.0 100.2*  PLT 262 186 208 151 219   Lipid Panel: Recent Labs    07/09/22 0332  CHOL 207*  HDL 82  LDLCALC 108*  TRIG 84  CHOLHDL 2.5   TSH: Recent Labs    05/20/22 1120 07/08/22 1454 10/17/22 1613  TSH 17.58* 19.195* 0.24*   A1C: Lab Results  Component Value Date   HGBA1C 5.8 (H) 07/09/2022     Assessment/Plan 1. History of pelvic fracture - 05/09 mechanical fall> xray confirmed fractures to inferior pubic ramus on left extending to pubic symphysis - completed 3 weeks rehab - ambulating with wheelchair - 1+ assist with transfers - on recent falls - enrolled in HHPT/OT - PWC recommended by OT> agree with recommendation to improve quality of life and mobility  2. Spider bite wound, accidental or unintentional, subsequent encounter - ongoing - started on Keflex, but switched to doxycycline> still has 2 days - bruising appears improved - small bump has not reduced in size - recommend warm compress 20 min TID x 7 days - may need dermatology referral to [facility]  3. Essential hypertension - controlled, goal < 150/90 - suspect increased anxiety  last week - will go back to atenolol 25 mg po daily - atenolol (TENORMIN) 25 MG tablet; Take 1 tablet (25 mg total) by mouth every morning.  Dispense: 120 tablet; Refill: 1   Virtual Visit   I connected with Kristen Whitehead by video visit and verified that I am speaking with the correct person using two identifiers.  Location: [facility] Patient: Kristen Whitehead Provider: [name], NP    I discussed the limitations, risks, security and privacy concerns of performing an evaluation and management service by telephone and the availability of in person appointments. I also discussed with the patient that there may be a patient responsible charge related to this service. The patient expressed understanding and agreed to proceed.   I discussed the assessment and treatment plan with the patient. The patient was provided an opportunity to ask questions and all were answered. The patient agreed with the plan and demonstrated an understanding of the instructions.   The patient was advised to call back or seek an in-person evaluation if the symptoms worsen or if the condition fails to improve as anticipated.  I provided 14 minutes of face-to-face time during this encounter.  Kristen Heir, NP , AGNP Avs printed and mailed   Next appt: 07/10/2023  Bettina Gavia  [facility] & Adult Medicine [phone]

## 2023-03-17 NOTE — Progress Notes (Signed)
   This service is provided via telemedicine  No vital signs collected/recorded due to the encounter was a telemedicine visit.   Location of patient (ex: home, work):  Home  Patient consents to a telephone visit:  Yes  Location of the provider (ex: office, home):  [facility].  Name of any referring provider:  [name], NP   Names of all persons participating in the telemedicine service and their role in the encounter:  Patient, Granddaughter Toniann Fail, Meda Klinefelter, RMA, Hazle Nordmann, NP.    Time spent on call: 8 minutes spent on the phone with Medical Assistant.

## 2023-03-18 ENCOUNTER — Ambulatory Visit: Payer: Medicare Other | Admitting: Audiology

## 2023-03-18 DIAGNOSIS — Z7901 Long term (current) use of anticoagulants: Secondary | ICD-10-CM | POA: Diagnosis not present

## 2023-03-18 DIAGNOSIS — E039 Hypothyroidism, unspecified: Secondary | ICD-10-CM | POA: Diagnosis not present

## 2023-03-18 DIAGNOSIS — I480 Paroxysmal atrial fibrillation: Secondary | ICD-10-CM | POA: Diagnosis not present

## 2023-03-18 DIAGNOSIS — S32502D Unspecified fracture of left pubis, subsequent encounter for fracture with routine healing: Secondary | ICD-10-CM | POA: Diagnosis not present

## 2023-03-18 DIAGNOSIS — Z79891 Long term (current) use of opiate analgesic: Secondary | ICD-10-CM | POA: Diagnosis not present

## 2023-03-18 DIAGNOSIS — Z9181 History of falling: Secondary | ICD-10-CM | POA: Diagnosis not present

## 2023-03-18 DIAGNOSIS — I5032 Chronic diastolic (congestive) heart failure: Secondary | ICD-10-CM | POA: Diagnosis not present

## 2023-03-18 DIAGNOSIS — I110 Hypertensive heart disease with heart failure: Secondary | ICD-10-CM | POA: Diagnosis not present

## 2023-03-18 DIAGNOSIS — E46 Unspecified protein-calorie malnutrition: Secondary | ICD-10-CM | POA: Diagnosis not present

## 2023-03-18 DIAGNOSIS — D649 Anemia, unspecified: Secondary | ICD-10-CM | POA: Diagnosis not present

## 2023-03-18 DIAGNOSIS — E785 Hyperlipidemia, unspecified: Secondary | ICD-10-CM | POA: Diagnosis not present

## 2023-03-25 ENCOUNTER — Ambulatory Visit: Payer: Self-pay

## 2023-03-25 ENCOUNTER — Telehealth (INDEPENDENT_AMBULATORY_CARE_PROVIDER_SITE_OTHER): Payer: Medicare Other | Admitting: Orthopedic Surgery

## 2023-03-25 ENCOUNTER — Encounter: Payer: Self-pay | Admitting: Orthopedic Surgery

## 2023-03-25 DIAGNOSIS — I10 Essential (primary) hypertension: Secondary | ICD-10-CM

## 2023-03-25 DIAGNOSIS — R000 Tachycardia, unspecified: Secondary | ICD-10-CM

## 2023-03-25 MED ORDER — ATENOLOL 25 MG PO TABS
25.0000 mg | ORAL_TABLET | Freq: Two times a day (BID) | ORAL | 1 refills | Status: DC
Start: 2023-03-25 — End: 2023-09-18

## 2023-03-25 NOTE — Progress Notes (Signed)
Careteam: Patient Care Team: [name], NP as PCP - General (Adult Health Nurse Practitioner) O'Neal, Ronnald Ramp, MD as PCP - Cardiology (Cardiology) Clarene Duke, Karma Lew, RN as [facility] Care Management O'Neal, Ronnald Ramp, MD as Consulting Physician (Cardiology)  Seen by: Hazle Nordmann, AGNP-C  PLACE OF SERVICE:  [facility]  Advanced Directive information Does Patient Have a Medical Advance Directive?: Yes, Type of Advance Directive: Healthcare Power of [name];Living will;Out of facility DNR (pink MOST or yellow form), Does patient want to make changes to medical advance directive?: No - Patient declined  Allergies  Allergen Reactions   Azithromycin Nausea Only   Compazine [Prochlorperazine Edisylate] Nausea Only   Demerol Nausea Only   Dolophine [Methadone] Nausea Only   Ebastine Nausea Only    EBS   Erythromycin Nausea Only   Gluten Meal Diarrhea and Other (See Comments)    Has Celiac Disease   Ibuprofen Nausea Only   Meperidine Hcl Other (See Comments)    Reaction not noted   Morphine Sulfate Itching   Other Diarrhea and Other (See Comments)    CELIAC DISEASE   Statins Other (See Comments)    Myalgias    Tetanus Toxoids Nausea Only   Tetracyclines & Related Nausea Only    Chief Complaint  Patient presents with   Acute Visit    Patient granddaughter wants to discuss heart rate and blood pressure.      HPI: Patient is a 89 y.o. female seen today via video visit due to elevated blood pressure.   Granddaughter present during encounter.   H/o afib, basilar artery thrombosis, CHF, HTN and mitral regurgitation. She has been taking blood pressures daily and noticed they are averaging SBP > 150. HR is also > 100. She has been checking vitals about 20 minutes after exertion. She is currently taking atenolol. Last wee she was advised to have extra 1/2 tablet if SBP > 150 or HR >100. Todays, blood pressure was 157/108, HR 130's. She took extra 1/2  tablet and pressure improved 146/96, HR 86. Treatment options discussed with patient. Atenolol pill is hard to cut in half. Requesting to try atenolol 25 mg po BID. Denies chest pain, sob, blurred vision. She has had a mild intermittent headache once. Afebrile. Vitals stable.    Review of Systems:  Review of Systems  Constitutional:  Negative for fever.  Respiratory:  Negative for cough, shortness of breath and wheezing.   Cardiovascular:  Negative for chest pain and leg swelling.  Musculoskeletal:  Negative for falls.  Neurological:  Positive for headaches. Negative for dizziness.  Psychiatric/Behavioral:  Negative for depression. The patient is nervous/anxious and has insomnia.     Past Medical History:  Diagnosis Date   Acute on chronic diastolic CHF (congestive heart failure) (HCC) 02/01/2014   Bladder prolapse, female, acquired    Cancer [facility])    thyroid   Chest pain    no known ischemic heart disease; negative Myoview July 2013. EF 75% with no ischemia.    Chronic anticoagulation    Chronic atrial fibrillation (HCC)    managed with rate control and coumadin   Chronic diastolic heart failure (HCC)    GERD (gastroesophageal reflux disease)    Heart disease    History of congenital mitral regurgitation    mild   History of heart attack    Multiple    History of mitral valve prolapse 06/15/2008   a. echo 1/14: mild LVH, EF 60-65%, mild MR, mild  to mod BAE, PASP 35   Hypercholesterolemia    Hypertension    Hypothyroidism    Stroke [facility])    Past Surgical History:  Procedure Laterality Date   ABDOMINAL HYSTERECTOMY     CATARACT EXTRACTION     CHOLECYSTECTOMY     Thyroidectomy     TONSILLECTOMY     Social History:   reports that she has never smoked. She has never used smokeless tobacco. She reports that she does not drink alcohol and does not use drugs.  Family History  Problem Relation Age of Onset   Stroke Mother 9   Stroke Father 59   Arthritis Daughter     Gout Daughter    Immunocompromised Granddaughter    Heart attack Neg Hx    Heart disease Neg Hx     Medications: Patient's Medications  New Prescriptions   No medications on file  Previous Medications   ACETAMINOPHEN (TYLENOL) 500 MG TABLET    Take 2 tablets (1,000 mg total) by mouth every 8 (eight) hours as needed.   ATENOLOL (TENORMIN) 25 MG TABLET    Take 1 tablet (25 mg total) by mouth every morning.   CHLORHEXIDINE (PERIDEX) 0.12 % SOLUTION    Use as directed 15 mLs in the mouth or throat daily.   DOCUSATE SODIUM (COLACE) 100 MG CAPSULE    Take 100 mg by mouth daily.   DORZOLAMIDE (TRUSOPT) 2 % OPHTHALMIC SOLUTION    Place 1 drop into the left eye 2 (two) times daily.   ELIQUIS 2.5 MG TABS TABLET    TAKE 1 TABLET(2.5 MG) BY MOUTH TWICE DAILY   EZETIMIBE (ZETIA) 10 MG TABLET    Take 1 tablet (10 mg total) by mouth daily.   FUROSEMIDE (LASIX) 40 MG TABLET    Take 1 tablet (40 mg total) by mouth daily.   LATANOPROST (XALATAN) 0.005 % OPHTHALMIC SOLUTION    Place 1 drop into both eyes at bedtime.    LEVOTHYROXINE (SYNTHROID) 50 MCG TABLET    Take 1 tablet (50 mcg total) by mouth daily.   MULTIPLE VITAMIN (MULTIVITAMIN WITH MINERALS) TABS TABLET    Take 1 tablet by mouth daily.   NITROGLYCERIN (NITROSTAT) 0.4 MG SL TABLET    DISSOLVE 1 TABLET UNDER THE TONGUE EVERY 5 MINUTES FOR 3 DOSES AS NEEDED  FOR  CHEST  PAIN   OMEPRAZOLE (PRILOSEC) 20 MG CAPSULE    Take 1 capsule (20 mg total) by mouth daily.   POLYVINYL ALCOHOL-POVIDONE (REFRESH OP)    Place 2 drops into both eyes daily as needed (dry eye).   PREMARIN VAGINAL CREAM    Place 2 g vaginally 2 (two) times a week. Uses on Sunday and wednesday   RHOPRESSA 0.02 % SOLN    Place 1 drop into the right eye every morning.   TEMAZEPAM (RESTORIL) 15 MG CAPSULE    Take 1 capsule (15 mg total) by mouth at bedtime.   WHITE PETROLATUM-MINERAL OIL (REFRESH LACRI-LUBE) OINT    Place 1 Application into both eyes at bedtime.  Modified Medications   No  medications on file  Discontinued Medications   No medications on file    Physical Exam:  There were no vitals filed for this visit. There is no height or weight on file to calculate BMI. Wt Readings from Last 3 Encounters:  03/12/23 110 lb (49.9 kg)  03/06/23 110 lb 6.4 oz (50.1 kg)  03/05/23 111 lb 6.4 oz (50.5 kg)    Physical Exam Vitals (  exam limited due to virtual visit) reviewed.  Constitutional:      General: She is not in acute distress. Neurological:     Mental Status: She is alert.     Labs reviewed: Basic Metabolic Panel: Recent Labs    05/20/22 1120 06/24/22 1624 07/08/22 1454 07/08/22 1738 07/08/22 2225 10/17/22 1613 10/23/22 1233 12/27/22 0537 12/28/22 0509 03/12/23 2107  NA  --    < >  --  139   < > 141   < > 131* 133* 136  K  --    < >  --  3.5   < > 3.7   < > 5.1 4.2 3.1*  CL  --    < >  --  106   < > 99   < > 99 102 96*  CO2  --    < >  --  27   < > 35*   < > 26 24 28   GLUCOSE  --    < >  --  125*   < > 92   < > 106* 110* 99  BUN  --    < >  --  9   < > 11   < > 17 19 19   CREATININE  --    < >  --  0.65   < > 0.66   < > 0.95 0.95 0.60  CALCIUM  --    < >  --  7.6*   < > 8.7   < > 7.1* 7.0* 8.8*  MG  --   --   --  1.8  --   --   --   --   --   --   TSH 17.58*  --  19.195*  --   --  0.24*  --   --   --   --    < > = values in this interval not displayed.   Liver Function Tests: Recent Labs    10/17/22 1613 10/23/22 1233 03/12/23 2107  AST 19 23 33  ALT 13 13 25   ALKPHOS  --  79 88  BILITOT 0.5 0.6 0.8  PROT 6.5 7.5 7.0  ALBUMIN  --  4.0 3.9   No results for input(s): "LIPASE", "AMYLASE" in the last 8760 hours. No results for input(s): "AMMONIA" in the last 8760 hours. CBC: Recent Labs    10/17/22 1613 10/23/22 1233 12/26/22 0040 12/27/22 0651 03/12/23 2107  WBC 6.6 7.6 10.0 8.2 5.3  NEUTROABS 3,168 4.3 6.9  --   --   HGB 14.1 14.7 13.2 9.7* 14.0  HCT 42.4 42.8 39.1 29.3* 42.6  MCV 95.5 95.5 94.7 98.0 100.2*  PLT 262 186 208  151 219   Lipid Panel: Recent Labs    07/09/22 0332  CHOL 207*  HDL 82  LDLCALC 108*  TRIG 84  CHOLHDL 2.5   TSH: Recent Labs    05/20/22 1120 07/08/22 1454 10/17/22 1613  TSH 17.58* 19.195* 0.24*   A1C: Lab Results  Component Value Date   HGBA1C 5.8 (H) 07/09/2022     Assessment/Plan 1. Essential hypertension - uncontrolled, SBP > 150/90 - BUN/creat 19/0.60 03/12/2023 - mild headache otherwise asymptomatic - will increase atenolol to 25 mg BID - advised to check blood pressures twice daily 1-2 hours after taking medication - atenolol (TENORMIN) 25 MG tablet; Take 1 tablet (25 mg total) by mouth 2 (two) times daily.  Dispense: 120 tablet; Refill: 1  2. Tachycardia with heart  rate 100-120 beats per minute - improved with extra atenolol - goal HR< 100 - see above - consider recheck K+   Virtual Visit  I connected with Kristen Whitehead via virtual visit and verified that I am speaking with the correct person using two identifiers.  Location:[facility] Patient: Kristen Whitehead Provider: [name], NP    I discussed the limitations, risks, security and privacy concerns of performing an evaluation and management service by telephone and the availability of in person appointments. I also discussed with the patient that there may be a patient responsible charge related to this service. The patient expressed understanding and agreed to proceed.   I discussed the assessment and treatment plan with the patient. The patient was provided an opportunity to ask questions and all were answered. The patient agreed with the plan and demonstrated an understanding of the instructions.   The patient was advised to call back or seek an in-person evaluation if the symptoms worsen or if the condition fails to improve as anticipated.  I provided 11 minutes of face-to-face time during this encounter.  Hazle Nordmann, Juel Burrow Avs printed and mailed   Next appt: 07/10/2023    Scherry Ran  [facility] & Adult Medicine [phone]    This service is provided via telemedicine  No vital signs collected/recorded due to the encounter was a telemedicine visit.   Location of patient (ex: home, work):  Home  Patient consents to a telephone visit:  Yes  Location of the provider (ex: office, home):  [employer].  Name of any referring provider:  .ppc   Names of all persons participating in the telemedicine service and their role in the encounter:  Patient, Granddaughter Toniann Fail, Meda Klinefelter, RMA, Hazle Nordmann, NP.    Time spent on call:  8 minutes spent on the phone with Medical Assistant.

## 2023-03-25 NOTE — Patient Instructions (Signed)
-   will increase atenolol to 25 mg twice daily - recommend checking blood pressures twice daily 1-2 hours after taking medication  Report any dizziness, persistent headaches, blurred vision  Goal blood pressure < 150/90 and HR < 100

## 2023-03-25 NOTE — Patient Outreach (Signed)
  Care Coordination   03/25/2023 Name: ENDA TO MRN: [id number] DOB: 04-Aug-1923   Care Coordination Outreach Attempts:  An unsuccessful telephone outreach was attempted for a scheduled appointment today.  Follow Up Plan:  Additional outreach attempts will be made to offer the patient care coordination information and services.   Encounter Outcome:  No Answer   Care Coordination Interventions:  No, not indicated    Delsa Sale, RN, BSN, CCM Care Management Coordinator [facility] Care Management  Direct Phone: [phone]

## 2023-03-27 ENCOUNTER — Ambulatory Visit: Payer: Medicare Other | Admitting: Audiology

## 2023-04-02 DIAGNOSIS — I69398 Other sequelae of cerebral infarction: Secondary | ICD-10-CM | POA: Diagnosis not present

## 2023-04-02 DIAGNOSIS — R2689 Other abnormalities of gait and mobility: Secondary | ICD-10-CM | POA: Diagnosis not present

## 2023-04-02 DIAGNOSIS — R2681 Unsteadiness on feet: Secondary | ICD-10-CM | POA: Diagnosis not present

## 2023-04-02 DIAGNOSIS — M6281 Muscle weakness (generalized): Secondary | ICD-10-CM | POA: Diagnosis not present

## 2023-04-11 ENCOUNTER — Telehealth: Payer: Self-pay | Admitting: *Deleted

## 2023-04-16 NOTE — Progress Notes (Signed)
  Care Coordination Note  04/16/2023 Name: KACIE DIERS MRN: [id number] DOB: 1923-07-17  SARANYA WIGHTMAN is a 89 y.o. year old female who is a primary care patient of Coletta Memos, Amy E, NP and is actively engaged with the care management team. I reached out to [name] by phone today to assist with re-scheduling a follow up visit with the RN Case Manager  Follow up plan: Unsuccessful telephone outreach attempt made. A HIPAA compliant phone message was left for the patient providing contact information and requesting a return call.   [facility]  Care Coordination Care Guide  Direct Dial: [phone]

## 2023-04-17 ENCOUNTER — Ambulatory Visit: Payer: Medicare Other | Attending: Orthopedic Surgery | Admitting: Audiology

## 2023-04-17 DIAGNOSIS — H903 Sensorineural hearing loss, bilateral: Secondary | ICD-10-CM | POA: Diagnosis not present

## 2023-04-17 NOTE — Progress Notes (Signed)
  Care Coordination Note  04/17/2023 Name: Kristen Whitehead MRN: [id number] DOB: 1923/07/06  Kristen Whitehead is a 89 y.o. year old female who is a primary care patient of Coletta Memos, Amy E, NP and is actively engaged with the care management team. I reached out to [name] by phone today to assist with re-scheduling a follow up visit with the RN Case Manager  Follow up plan: Unsuccessful telephone outreach attempt made. A HIPAA compliant phone message was left for the patient providing contact information and requesting a return call.  We have been unable to make contact with the patient for follow up. The care management team is available to follow up with the patient after provider conversation with the patient regarding recommendation for care management engagement and subsequent re-referral to the care management team.   [facility] Care Coordination Care Guide  Direct Dial: [phone]

## 2023-04-17 NOTE — Procedures (Signed)
  Outpatient Audiology and [facility] [address] [place], [place]  [address] [phone]  AUDIOLOGICAL  EVALUATION  NAME: Kristen Whitehead     DOB:   [birth year]      MRN: [id number]                                                                                     DATE: 04/17/2023     REFERENT: [name], NP STATUS: Outpatient DIAGNOSIS: Sensorineural hearing loss  History: [name] was seen for an audiological evaluation due to decreased hearing occurring for 1 year.  She was accompanied to the appointment by her daughter.  [name] reports she first noticed her hearing loss in November 2023 after having a stroke.  She reports increased difficulty communicating with her family members and hearing conversations.  [name] denies otalgia, tinnitus, aural fullness, and vertigo.  [name] reports as a child she had multiple ear infections and a procedure was done that perforated her right eardrum.   Evaluation:  Otoscopy showed a clear view of the tympanic membrane in the left ear and otoscopy showed a clear view of the tympanic membrane in the right ear with excessive white scarring. Tympanometry results were consistent in the left ear with normal middle ear pressure and reduced tympanic membrane mobility.  Tympanometry results were consistent in the right ear with normal middle ear pressure and normal tympanic membrane mobility.  [name] reported tympanometry was painful in the right ear. Audiometric testing was completed using Conventional Audiometry techniques with insert earphones and TDH headphones. Test results are consistent with a mild to profound sensorineural hearing loss, bilaterally, with the exception of a conductive component noted at 250 Hz in the right ear. Speech Recognition Thresholds were obtained at 45 dB HL in the right ear and at 45  dB HL in the left ear. Word Recognition Testing was completed at 80 dB HL and [name] scored 88% in the right ear and 84% in  the left ear.     Results:  The test results were reviewed with [name] and her daughter. Today's test results are consistent with a mild to profound sensorineural hearing loss, bilaterally, with the exception of a conductive component noted at 250 Hz in the right ear.  [name] will have hearing and communication difficulty in all listening environments.  She will benefit from the use of good communication strategies and the use of hearing aids for both ears.  [name] and her daughter were given copies of her audiogram today.  They were also given information on hearing aid providers in the [place] area and handouts regarding good communication strategies.  [name] reported she resides in [place] and will schedule an appointment with an audiologist in [place] to further pursue hearing aids.  Recommendations: 1.   Communication needs assessment with an audiologist to further discuss hearing aids. 2.   Monitor hearing sensitivity as needed.   40 minutes spent testing and counseling on results.   If you have any questions please feel free to contact me at [phone].  Marton Redwood Audiologist, Au.D., CCC-A 04/17/2023  1:21 PM  Cc: [name], NP

## 2023-04-22 ENCOUNTER — Other Ambulatory Visit: Payer: Self-pay | Admitting: Orthopedic Surgery

## 2023-04-22 DIAGNOSIS — F5101 Primary insomnia: Secondary | ICD-10-CM

## 2023-04-22 NOTE — Telephone Encounter (Signed)
Patient has request refill on medication Temazepam 15mg . Patient medication last refilled 12/31/2022. Patient has Non Opioid Contract on file dated 04/17/2021. Patient has upcoming appointment 07/10/2023. Update Contract added to patient appointment notes. Medication pend and sent to PCP [name], NP for approval.

## 2023-04-23 DIAGNOSIS — H353211 Exudative age-related macular degeneration, right eye, with active choroidal neovascularization: Secondary | ICD-10-CM | POA: Diagnosis not present

## 2023-05-19 DIAGNOSIS — Z6822 Body mass index (BMI) 22.0-22.9, adult: Secondary | ICD-10-CM | POA: Diagnosis not present

## 2023-05-25 ENCOUNTER — Other Ambulatory Visit: Payer: Self-pay | Admitting: General Practice

## 2023-05-28 DIAGNOSIS — H401131 Primary open-angle glaucoma, bilateral, mild stage: Secondary | ICD-10-CM | POA: Diagnosis not present

## 2023-05-28 DIAGNOSIS — H524 Presbyopia: Secondary | ICD-10-CM | POA: Diagnosis not present

## 2023-05-28 DIAGNOSIS — H353231 Exudative age-related macular degeneration, bilateral, with active choroidal neovascularization: Secondary | ICD-10-CM | POA: Diagnosis not present

## 2023-05-28 DIAGNOSIS — H35342 Macular cyst, hole, or pseudohole, left eye: Secondary | ICD-10-CM | POA: Diagnosis not present

## 2023-05-28 DIAGNOSIS — H04123 Dry eye syndrome of bilateral lacrimal glands: Secondary | ICD-10-CM | POA: Diagnosis not present

## 2023-06-13 DIAGNOSIS — H353211 Exudative age-related macular degeneration, right eye, with active choroidal neovascularization: Secondary | ICD-10-CM | POA: Diagnosis not present

## 2023-07-10 ENCOUNTER — Encounter: Payer: Self-pay | Admitting: Orthopedic Surgery

## 2023-07-10 ENCOUNTER — Ambulatory Visit (INDEPENDENT_AMBULATORY_CARE_PROVIDER_SITE_OTHER): Payer: Medicare Other | Admitting: Orthopedic Surgery

## 2023-07-10 VITALS — BP 120/86 | HR 63 | Temp 97.5°F | Resp 16 | Ht <= 58 in | Wt 109.6 lb

## 2023-07-10 DIAGNOSIS — I5032 Chronic diastolic (congestive) heart failure: Secondary | ICD-10-CM

## 2023-07-10 DIAGNOSIS — I10 Essential (primary) hypertension: Secondary | ICD-10-CM | POA: Diagnosis not present

## 2023-07-10 DIAGNOSIS — Z8673 Personal history of transient ischemic attack (TIA), and cerebral infarction without residual deficits: Secondary | ICD-10-CM

## 2023-07-10 DIAGNOSIS — R2681 Unsteadiness on feet: Secondary | ICD-10-CM | POA: Diagnosis not present

## 2023-07-10 DIAGNOSIS — I4821 Permanent atrial fibrillation: Secondary | ICD-10-CM

## 2023-07-10 DIAGNOSIS — E039 Hypothyroidism, unspecified: Secondary | ICD-10-CM

## 2023-07-10 NOTE — Patient Instructions (Addendum)
Please schedule medicare annual wellness video visit with me sometime in 2025

## 2023-07-10 NOTE — Progress Notes (Signed)
Careteam: Patient Care Team: [name], NP as PCP - General (Adult Health Nurse Practitioner) O'Neal, Ronnald Ramp, MD as PCP - Cardiology (Cardiology) O'Neal, Ronnald Ramp, MD as Consulting Physician (Cardiology) Diona Foley, MD as Consulting Physician (Ophthalmology)  Seen by: Hazle Nordmann, AGNP-C  PLACE OF SERVICE:  [facility]  Advanced Directive information Does Patient Have a Medical Advance Directive?: Yes, Type of Advance Directive: Healthcare Power of [place];Living will;Out of facility DNR (pink MOST or yellow form), Does patient want to make changes to medical advance directive?: No - Patient declined  Allergies  Allergen Reactions   Azithromycin Nausea Only   Compazine [Prochlorperazine Edisylate] Nausea Only   Demerol Nausea Only   Dolophine [Methadone] Nausea Only   Ebastine Nausea Only    EBS   Erythromycin Nausea Only   Gluten Meal Diarrhea and Other (See Comments)    Has Celiac Disease   Ibuprofen Nausea Only   Meperidine Hcl Other (See Comments)    Reaction not noted   Morphine Sulfate Itching   Other Diarrhea and Other (See Comments)    CELIAC DISEASE   Statins Other (See Comments)    Myalgias    Tetanus Toxoids Nausea Only   Tetracyclines & Related Nausea Only    Chief Complaint  Patient presents with   Medical Management of Chronic Issues    4 month follow up.    Immunizations    Discuss the need for Influenza vaccine, DTAP vaccine, and Covid Booster.    Health Maintenance    Discuss the need for AWV.   Contract     Medication Contract      HPI: Patient is a 89 y.o. female seen today for medical management of chronic conditions.   Daughter present during encounter.   No health concerns today.   Doing well living with granddaughter.   Ambulating with walker. No recent falls. H/o pelvic fracture 12/2022.   Now using hearing aids. Does not always wear per family.   Weight stable. Eating 2-3 meals daily.   Off temazepam.  Sleeping well.   Blood pressure 120/86, remains on atenolol.   PAF, HR< 100, remains on low dose Eliquis and atenolol.   No sob, edema or weight gain, remains on furosemide.   H/o CVA 07/08/2022> CT angio head and neck revealed thrombus to tip of basilar artery, bilateral PCA and left SCA/ MRI brain revealed small acute infarcts in left midbrain and right medial pons. Remains on Zetia and low dose Eliquis.   She had flu and covid vaccines at local pharmacy.   Review of Systems:  Review of Systems  Constitutional: Negative.   HENT:  Positive for hearing loss.   Eyes:  Negative for blurred vision.  Respiratory:  Negative for cough, shortness of breath and wheezing.   Cardiovascular:  Negative for chest pain and leg swelling.  Gastrointestinal:  Positive for heartburn.  Genitourinary: Negative.   Musculoskeletal:  Negative for falls and joint pain.  Skin: Negative.   Neurological:  Positive for weakness. Negative for dizziness and headaches.  Psychiatric/Behavioral:  Negative for depression and memory loss. The patient is not nervous/anxious and does not have insomnia.     Past Medical History:  Diagnosis Date   Acute on chronic diastolic CHF (congestive heart failure) (HCC) 02/01/2014   Bladder prolapse, female, acquired    Cancer [facility])    thyroid   Chest pain    no known ischemic heart disease; negative Myoview July 2013. EF 75% with  no ischemia.    Chronic anticoagulation    Chronic atrial fibrillation (HCC)    managed with rate control and coumadin   Chronic diastolic heart failure (HCC)    GERD (gastroesophageal reflux disease)    Heart disease    History of congenital mitral regurgitation    mild   History of heart attack    Multiple    History of mitral valve prolapse 06/15/2008   a. echo 1/14: mild LVH, EF 60-65%, mild MR, mild to mod BAE, PASP 35   Hypercholesterolemia    Hypertension    Hypothyroidism    Stroke [facility])    Past Surgical History:  Procedure  Laterality Date   ABDOMINAL HYSTERECTOMY     CATARACT EXTRACTION     CHOLECYSTECTOMY     Thyroidectomy     TONSILLECTOMY     Social History:   reports that she has never smoked. She has never used smokeless tobacco. She reports that she does not drink alcohol and does not use drugs.  Family History  Problem Relation Age of Onset   Stroke Mother 15   Stroke Father 7   Arthritis Daughter    Gout Daughter    Immunocompromised Granddaughter    Heart attack Neg Hx    Heart disease Neg Hx     Medications: Patient's Medications  New Prescriptions   No medications on file  Previous Medications   ACETAMINOPHEN (TYLENOL) 500 MG TABLET    Take 2 tablets (1,000 mg total) by mouth every 8 (eight) hours as needed.   ATENOLOL (TENORMIN) 25 MG TABLET    Take 1 tablet (25 mg total) by mouth 2 (two) times daily.   DOCUSATE SODIUM (COLACE) 100 MG CAPSULE    Take 100 mg by mouth daily.   DORZOLAMIDE (TRUSOPT) 2 % OPHTHALMIC SOLUTION    Place 1 drop into the left eye 2 (two) times daily.   ELIQUIS 2.5 MG TABS TABLET    TAKE 1 TABLET(2.5 MG) BY MOUTH TWICE DAILY   EZETIMIBE (ZETIA) 10 MG TABLET    Take 1 tablet (10 mg total) by mouth daily.   FUROSEMIDE (LASIX) 40 MG TABLET    TAKE 1 TABLET BY MOUTH EVERY MORNING AND 1/2 EVERY EVENING.   LATANOPROST (XALATAN) 0.005 % OPHTHALMIC SOLUTION    Place 1 drop into both eyes at bedtime.    LEVOTHYROXINE (SYNTHROID) 50 MCG TABLET    Take 1 tablet (50 mcg total) by mouth daily.   MULTIPLE VITAMIN (MULTIVITAMIN WITH MINERALS) TABS TABLET    Take 1 tablet by mouth daily.   NITROGLYCERIN (NITROSTAT) 0.4 MG SL TABLET    DISSOLVE 1 TABLET UNDER THE TONGUE EVERY 5 MINUTES FOR 3 DOSES AS NEEDED  FOR  CHEST  PAIN   OMEPRAZOLE (PRILOSEC) 20 MG CAPSULE    Take 1 capsule (20 mg total) by mouth daily.   PREMARIN VAGINAL CREAM    Place 2 g vaginally 2 (two) times a week. Uses on Sunday and wednesday  Modified Medications   No medications on file  Discontinued  Medications   CHLORHEXIDINE (PERIDEX) 0.12 % SOLUTION    Use as directed 15 mLs in the mouth or throat daily.   POLYVINYL ALCOHOL-POVIDONE (REFRESH OP)    Place 2 drops into both eyes daily as needed (dry eye).   RHOPRESSA 0.02 % SOLN    Place 1 drop into the right eye every morning.   TEMAZEPAM (RESTORIL) 15 MG CAPSULE    TAKE 1 CAPSULE(15 MG) BY  MOUTH AT BEDTIME   WHITE PETROLATUM-MINERAL OIL (REFRESH LACRI-LUBE) OINT    Place 1 Application into both eyes at bedtime.    Physical Exam:  Vitals:   07/10/23 1357  BP: 120/86  Pulse: 63  Resp: 16  Temp: (!) 97.5 F (36.4 C)  SpO2: 94%  Weight: 109 lb 9.6 oz (49.7 kg)  Height: 4\' 10"  (1.473 m)   Body mass index is 22.91 kg/m. Wt Readings from Last 3 Encounters:  07/10/23 109 lb 9.6 oz (49.7 kg)  03/12/23 110 lb (49.9 kg)  03/06/23 110 lb 6.4 oz (50.1 kg)    Physical Exam Vitals reviewed.  Constitutional:      General: She is not in acute distress. HENT:     Head: Normocephalic.  Eyes:     General:        Right eye: No discharge.        Left eye: No discharge.  Cardiovascular:     Rate and Rhythm: Normal rate. Rhythm irregular.     Pulses: Normal pulses.     Heart sounds: Normal heart sounds.  Pulmonary:     Effort: Pulmonary effort is normal. No respiratory distress.     Breath sounds: Normal breath sounds. No wheezing or rales.  Abdominal:     General: Bowel sounds are normal.     Palpations: Abdomen is soft.  Musculoskeletal:     Cervical back: Neck supple.     Right lower leg: No edema.     Left lower leg: No edema.  Skin:    General: Skin is warm.     Capillary Refill: Capillary refill takes less than 2 seconds.  Neurological:     General: No focal deficit present.     Mental Status: She is alert and oriented to person, place, and time.     Motor: Weakness present.     Gait: Gait abnormal.     Comments: FWW  Psychiatric:        Mood and Affect: Mood normal.     Labs reviewed: Basic Metabolic  Panel: Recent Labs    10/17/22 1613 10/23/22 1233 12/27/22 0537 12/28/22 0509 03/12/23 2107  NA 141   < > 131* 133* 136  K 3.7   < > 5.1 4.2 3.1*  CL 99   < > 99 102 96*  CO2 35*   < > 26 24 28   GLUCOSE 92   < > 106* 110* 99  BUN 11   < > 17 19 19   CREATININE 0.66   < > 0.95 0.95 0.60  CALCIUM 8.7   < > 7.1* 7.0* 8.8*  TSH 0.24*  --   --   --   --    < > = values in this interval not displayed.   Liver Function Tests: Recent Labs    10/17/22 1613 10/23/22 1233 03/12/23 2107  AST 19 23 33  ALT 13 13 25   ALKPHOS  --  79 88  BILITOT 0.5 0.6 0.8  PROT 6.5 7.5 7.0  ALBUMIN  --  4.0 3.9   No results for input(s): "LIPASE", "AMYLASE" in the last 8760 hours. No results for input(s): "AMMONIA" in the last 8760 hours. CBC: Recent Labs    10/17/22 1613 10/23/22 1233 12/26/22 0040 12/27/22 0651 03/12/23 2107  WBC 6.6 7.6 10.0 8.2 5.3  NEUTROABS 3,168 4.3 6.9  --   --   HGB 14.1 14.7 13.2 9.7* 14.0  HCT 42.4 42.8 39.1 29.3* 42.6  MCV 95.5  95.5 94.7 98.0 100.2*  PLT 262 186 208 151 219   Lipid Panel: No results for input(s): "CHOL", "HDL", "LDLCALC", "TRIG", "CHOLHDL", "LDLDIRECT" in the last 8760 hours. TSH: Recent Labs    10/17/22 1613  TSH 0.24*   A1C: Lab Results  Component Value Date   HGBA1C 5.8 (H) 07/09/2022     Assessment/Plan 1. Essential hypertension - controlled, goal < 150/90 - cont atenolol and furosemide - CBC with Differential/Platelet - Complete Metabolic Panel with eGFR  2. Permanent atrial fibrillation (HCC) - HR< 100 with atenolol - on low dose Eliquis for clot prevention - TSH  3. Hypothyroidism, unspecified type - cont levothyroxine  - TSH  4. Chronic diastolic heart failure (HCC) - compensated - cont furosemide   5. History of CVA (cerebrovascular accident) - 07/08/2022> weakness and slurred speech - CT angio head and neck revealed thrombus to tip of basilar artery, bilateral PCA and left SCA/ MRI brain revealed small  acute infarcts in left midbrain and right medial pons  - cont Zetia and low dose Eliquis  6. Unstable gait - ongoing - h/o pelvic fracture 12/2022 - no recent falls  - ambulates well with FWW  Total time: 32 minutes. Greater than 50% of total time spent doing patient education regarding health maintenance, HTN, PAF, hypothyroidism, CHF and unstable gait including symptom/medication management.     Next appt: Visit date not found  Elya Diloreto Scherry Ran  [facility] [phone]

## 2023-07-11 LAB — CBC WITH DIFFERENTIAL/PLATELET
Absolute Lymphocytes: 1498 {cells}/uL (ref 850–3900)
Absolute Monocytes: 478 {cells}/uL (ref 200–950)
Basophils Absolute: 21 {cells}/uL (ref 0–200)
Basophils Relative: 0.4 %
Eosinophils Absolute: 42 {cells}/uL (ref 15–500)
Eosinophils Relative: 0.8 %
HCT: 43.3 % (ref 35.0–45.0)
Hemoglobin: 14.3 g/dL (ref 11.7–15.5)
MCH: 32.6 pg (ref 27.0–33.0)
MCHC: 33 g/dL (ref 32.0–36.0)
MCV: 98.6 fL (ref 80.0–100.0)
MPV: 10 fL (ref 7.5–12.5)
Monocytes Relative: 9.2 %
Neutro Abs: 3162 {cells}/uL (ref 1500–7800)
Neutrophils Relative %: 60.8 %
Platelets: 214 10*3/uL (ref 140–400)
RBC: 4.39 10*6/uL (ref 3.80–5.10)
RDW: 13.1 % (ref 11.0–15.0)
Total Lymphocyte: 28.8 %
WBC: 5.2 10*3/uL (ref 3.8–10.8)

## 2023-07-11 LAB — COMPLETE METABOLIC PANEL WITH GFR
AG Ratio: 1.5 (calc) (ref 1.0–2.5)
ALT: 16 U/L (ref 6–29)
AST: 24 U/L (ref 10–35)
Albumin: 4.3 g/dL (ref 3.6–5.1)
Alkaline phosphatase (APISO): 73 U/L (ref 37–153)
BUN: 18 mg/dL (ref 7–25)
CO2: 31 mmol/L (ref 20–32)
Calcium: 8.9 mg/dL (ref 8.6–10.4)
Chloride: 101 mmol/L (ref 98–110)
Creat: 0.86 mg/dL (ref 0.60–0.95)
Globulin: 2.9 g/dL (ref 1.9–3.7)
Glucose, Bld: 85 mg/dL (ref 65–139)
Potassium: 4 mmol/L (ref 3.5–5.3)
Sodium: 143 mmol/L (ref 135–146)
Total Bilirubin: 0.8 mg/dL (ref 0.2–1.2)
Total Protein: 7.2 g/dL (ref 6.1–8.1)
eGFR: 61 mL/min/{1.73_m2} (ref >=60)

## 2023-07-11 LAB — TSH: TSH: 17.34 m[IU]/L — ABNORMAL HIGH (ref 0.40–4.50)

## 2023-07-16 ENCOUNTER — Other Ambulatory Visit: Payer: Self-pay

## 2023-07-16 ENCOUNTER — Other Ambulatory Visit: Payer: Self-pay | Admitting: Orthopedic Surgery

## 2023-07-16 DIAGNOSIS — E039 Hypothyroidism, unspecified: Secondary | ICD-10-CM

## 2023-07-16 DIAGNOSIS — R7989 Other specified abnormal findings of blood chemistry: Secondary | ICD-10-CM

## 2023-07-16 MED ORDER — LEVOTHYROXINE SODIUM 75 MCG PO TABS
75.0000 ug | ORAL_TABLET | Freq: Every day | ORAL | 3 refills | Status: DC
Start: 2023-07-16 — End: 2023-09-19

## 2023-07-28 DIAGNOSIS — H353211 Exudative age-related macular degeneration, right eye, with active choroidal neovascularization: Secondary | ICD-10-CM | POA: Diagnosis not present

## 2023-08-04 ENCOUNTER — Other Ambulatory Visit: Payer: Self-pay | Admitting: Orthopedic Surgery

## 2023-08-04 ENCOUNTER — Telehealth: Payer: Medicare Other | Admitting: *Deleted

## 2023-08-04 DIAGNOSIS — F5101 Primary insomnia: Secondary | ICD-10-CM

## 2023-08-04 MED ORDER — TEMAZEPAM 15 MG PO CAPS: 15.0000 mg | ORAL_CAPSULE | Freq: Every evening | ORAL | 4 refills | Status: DC | PRN

## 2023-08-04 NOTE — Progress Notes (Signed)
Toniann Fail, caregiver, calls to report she never stopped taking temazepam for sleeping. She has taken medication> 10 years for sleeping. She has 2 pills left. Will refill medication today.

## 2023-08-04 NOTE — Telephone Encounter (Signed)
Medication refilled. Toniann Fail called and updated.

## 2023-08-04 NOTE — Telephone Encounter (Signed)
Patient caregiver, Toniann Fail called and stated that patient has been on Temazepam 15mg  for years and she has called twice requesting a refill and it still has not been sent to the pharmacy.   Temazepam was Discontinued at last appointment on 07/10/2023. Patient is still taking it and needs it for rest.   Please Advise.

## 2023-08-13 ENCOUNTER — Other Ambulatory Visit: Payer: Self-pay | Admitting: Cardiovascular Disease

## 2023-08-15 ENCOUNTER — Other Ambulatory Visit: Payer: Self-pay

## 2023-08-15 DIAGNOSIS — R7989 Other specified abnormal findings of blood chemistry: Secondary | ICD-10-CM

## 2023-08-15 DIAGNOSIS — E039 Hypothyroidism, unspecified: Secondary | ICD-10-CM

## 2023-08-28 ENCOUNTER — Other Ambulatory Visit: Payer: Self-pay | Admitting: Orthopedic Surgery

## 2023-08-28 ENCOUNTER — Encounter: Payer: Self-pay | Admitting: Orthopedic Surgery

## 2023-08-28 DIAGNOSIS — F419 Anxiety disorder, unspecified: Secondary | ICD-10-CM

## 2023-08-28 MED ORDER — BUSPIRONE HCL 5 MG PO TABS
5.0000 mg | ORAL_TABLET | Freq: Two times a day (BID) | ORAL | 3 refills | Status: DC
Start: 2023-08-28 — End: 2024-01-02

## 2023-08-28 NOTE — Progress Notes (Signed)
 Caregiver reports increased anxiety, especially at night. She is having trouble sleeping and also calling other physicians almost daily. She is taking temazepam . Caregiver has also had friends sit with her to see if anxiety improves. Friends have now refused to help because of rude/ mean behavior. She is taking temazepam  at night. H/o hyponatremia in past. Treatment options discussed, will try buspar  for increased anxiety. Advised to reach out to home health agency for caregiver assistance.

## 2023-09-14 ENCOUNTER — Other Ambulatory Visit: Payer: Self-pay | Admitting: Cardiovascular Disease

## 2023-09-14 DIAGNOSIS — I4821 Permanent atrial fibrillation: Secondary | ICD-10-CM

## 2023-09-15 NOTE — Telephone Encounter (Signed)
Prescription refill request for Eliquis received. Indication:afib Last office visit:3/24 Scr:0.86  11/24 Age: 89 Weight:49.7  kg  Prescription refilled

## 2023-09-16 ENCOUNTER — Encounter: Payer: Self-pay | Admitting: Orthopedic Surgery

## 2023-09-16 NOTE — Telephone Encounter (Signed)
Per Patient Advice:  Hey Amy it's Toniann Fail  again.Marland KitchenMarland KitchenI had to call ems at 3 am. She was shaking, could barely walk, said she had a headache. I took her vitals and her BP was 66/54 HR 63. I elevated her feet in the bed until the ambulance arrived and by then she had stabilized. We are coming in for lab on Thursday and I was wondering if I should have her seen or what should I do? She refused to go to the hospital and the emt didn't think it was anything like stroke or heart attack. Please Advise  Outgoing call placed to patient, no answer, and no voicemail. We will need to make additional attempts to reach patient.    Message sent to [name], NP

## 2023-09-16 NOTE — Telephone Encounter (Signed)
Patient's granddaughter spoke with [name], NP over the phone this morning and had no other questions or concerns at this time.

## 2023-09-18 ENCOUNTER — Ambulatory Visit (INDEPENDENT_AMBULATORY_CARE_PROVIDER_SITE_OTHER): Payer: Medicare PPO | Admitting: Orthopedic Surgery

## 2023-09-18 ENCOUNTER — Encounter: Payer: Self-pay | Admitting: Orthopedic Surgery

## 2023-09-18 VITALS — BP 114/80 | HR 78 | Temp 97.2°F | Resp 16 | Ht <= 58 in | Wt 106.0 lb

## 2023-09-18 DIAGNOSIS — R634 Abnormal weight loss: Secondary | ICD-10-CM

## 2023-09-18 DIAGNOSIS — Z7189 Other specified counseling: Secondary | ICD-10-CM

## 2023-09-18 DIAGNOSIS — I959 Hypotension, unspecified: Secondary | ICD-10-CM

## 2023-09-18 DIAGNOSIS — F5101 Primary insomnia: Secondary | ICD-10-CM

## 2023-09-18 DIAGNOSIS — I5032 Chronic diastolic (congestive) heart failure: Secondary | ICD-10-CM

## 2023-09-18 DIAGNOSIS — I10 Essential (primary) hypertension: Secondary | ICD-10-CM

## 2023-09-18 DIAGNOSIS — E039 Hypothyroidism, unspecified: Secondary | ICD-10-CM

## 2023-09-18 MED ORDER — ATENOLOL 25 MG PO TABS
ORAL_TABLET | ORAL | 3 refills | Status: AC
Start: 2023-09-18 — End: ?

## 2023-09-18 NOTE — Patient Instructions (Signed)
Message me hospice of your choosing  Hold furosemide> let me know if any shortness or breath or ankle edema  Start atenolol 25 mg every morning and 50 mg every evening

## 2023-09-18 NOTE — Progress Notes (Addendum)
Careteam: Patient Care Team: Kristen Heir, NP as PCP - General (Adult Health Nurse Practitioner) O'Neal, Ronnald Ramp, MD as PCP - Cardiology (Cardiology) O'Neal, Ronnald Ramp, MD as Consulting Physician (Cardiology) Diona Foley, MD as Consulting Physician (Ophthalmology)  Seen by: Hazle Nordmann, AGNP-C  PLACE OF SERVICE:  [facility]  Advanced Directive information Does Patient Have a Medical Advance Directive?: Yes, Type of Advance Directive: Out of facility DNR (pink MOST or yellow form), Does patient want to make changes to medical advance directive?: No - Patient declined  Allergies  Allergen Reactions   Azithromycin Nausea Only   Compazine [Prochlorperazine Edisylate] Nausea Only   Demerol Nausea Only   Dolophine [Methadone] Nausea Only   Ebastine Nausea Only    EBS   Erythromycin Nausea Only   Gluten Meal Diarrhea and Other (See Comments)    Has Celiac Disease   Ibuprofen Nausea Only   Meperidine Hcl Other (See Comments)    Reaction not noted   Morphine Sulfate Itching   Other Diarrhea and Other (See Comments)    CELIAC DISEASE   Statins Other (See Comments)    Myalgias    Tetanus Toxoids Nausea Only   Tetracyclines & Related Nausea Only    No chief complaint on file.    HPI: Patient is a 89 y.o. female seen today for acute visit to discuss advanced directives and fluctuating blood pressure.   Discussed the use of AI scribe software for clinical note transcription with the patient, who gave verbal consent to proceed.  History of Present Illness   The patient is a 89 year old female with congestive heart failure, stroke, atrial fibrillation, and high blood pressure who presents for discussion of hospice care. She is accompanied by her granddaughter, who is her primary caregiver.  She is considering transitioning to home hospice care due to her advanced age and multiple health issues, focusing on quality of life and comfort. Her health has declined over the  past year, and she wishes to have as many good days as possible. Currently living at home, she is interested in hospice services that can provide assistance with her medications and daily care needs. She  has a general DNR in place. She does not wish to go to the hospital anymore in the future if her health would to decline.   She has experienced significant weight loss over the past few years, from 129 pounds in June 2022 to 110 pounds in July 2024, and currently weighs 106 pounds. She feels more fatigued and notes changes in her mobility, though she is not experiencing pain. She consumes two meals a day and supplements with Boost drinks between meals and before bed, also enjoying Breyers chocolate ice cream to help maintain her calorie intake.  She has a history of congestive heart failure, atrial fibrillation, and high blood pressure, with fluctuating blood pressure readings, particularly high in the evening, reaching up to 200/110 mmHg. She will have headaches when blood pressure is this high. She is taking atenolol 25 mg twice daily, but started taking extra atenolol in evening when blood pressure was in 200's. Today's blood pressure 114/80, HR 78. Furosemide was stopped due to concerns about low blood pressure and dehydration. 3 days ago, she was shaking and had blood pressure in the 60's. Her granddaughter called 911 and lifted her legs above her chest. She refused to go to the hospital after EMS evaluation. She was advised to hold furosemide after incident. No current shortness  of breath or ankle swelling is reported.  She experiences sleep disturbances, waking after only two to three hours of sleep, and has tried melatonin without success. Remains on Temazepam at this time.     No recent falls. Ambulating short distances with walker. Granddaughter contacted home health agency about power wheelchair which was recommended last PT visit but never heard form them again.   Review of Systems:  Review  of Systems  Constitutional:  Positive for weight loss.  HENT: Negative.    Eyes:  Negative for blurred vision.  Respiratory:  Negative for cough, shortness of breath and wheezing.   Cardiovascular:  Negative for chest pain and leg swelling.  Gastrointestinal:  Positive for constipation.  Genitourinary:  Negative for dysuria and frequency.  Musculoskeletal:  Positive for joint pain. Negative for falls.  Skin: Negative.   Neurological:  Positive for weakness and headaches. Negative for dizziness.  Psychiatric/Behavioral:  Negative for depression and memory loss. The patient has insomnia. The patient is not nervous/anxious.     Past Medical History:  Diagnosis Date   Acute on chronic diastolic CHF (congestive heart failure) (HCC) 02/01/2014   Bladder prolapse, female, acquired    Cancer [facility])    thyroid   Chest pain    no known ischemic heart disease; negative Myoview July 2013. EF 75% with no ischemia.    Chronic anticoagulation    Chronic atrial fibrillation (HCC)    managed with rate control and coumadin   Chronic diastolic heart failure (HCC)    GERD (gastroesophageal reflux disease)    Heart disease    History of congenital mitral regurgitation    mild   History of heart attack    Multiple    History of mitral valve prolapse 06/15/2008   a. echo 1/14: mild LVH, EF 60-65%, mild MR, mild to mod BAE, PASP 35   Hypercholesterolemia    Hypertension    Hypothyroidism    Stroke [facility])    Past Surgical History:  Procedure Laterality Date   ABDOMINAL HYSTERECTOMY     CATARACT EXTRACTION     CHOLECYSTECTOMY     Thyroidectomy     TONSILLECTOMY     Social History:   reports that she has never smoked. She has never used smokeless tobacco. She reports that she does not drink alcohol and does not use drugs.  Family History  Problem Relation Age of Onset   Stroke Mother 57   Stroke Father 24   Arthritis Daughter    Gout Daughter    Immunocompromised Granddaughter    Heart  attack Neg Hx    Heart disease Neg Hx     Medications: Patient's Medications  New Prescriptions   No medications on file  Previous Medications   ATENOLOL (TENORMIN) 25 MG TABLET    Take 25 mg by mouth in the morning and at bedtime. 1 extra tablet as needed if blood pressure spike.   BUSPIRONE (BUSPAR) 5 MG TABLET    Take 1 tablet (5 mg total) by mouth 2 (two) times daily.   DORZOLAMIDE (TRUSOPT) 2 % OPHTHALMIC SOLUTION    Place 1 drop into the left eye 2 (two) times daily.   ELIQUIS 2.5 MG TABS TABLET    TAKE 1 TABLET(2.5 MG) BY MOUTH TWICE DAILY   EZETIMIBE (ZETIA) 10 MG TABLET    TAKE 1 TABLET(10 MG) BY MOUTH DAILY   LATANOPROST (XALATAN) 0.005 % OPHTHALMIC SOLUTION    Place 1 drop into both eyes at bedtime.  LEVOTHYROXINE (SYNTHROID) 75 MCG TABLET    Take 1 tablet (75 mcg total) by mouth daily.   OMEPRAZOLE (PRILOSEC) 20 MG CAPSULE    Take 1 capsule (20 mg total) by mouth daily.   PREMARIN VAGINAL CREAM    Place 2 g vaginally 2 (two) times a week. Uses on Sunday and wednesday   SODIUM CHLORIDE, HYPERTONIC, (SODIUM CHLORIDE OP)    Place 1 drop into both eyes 3 (three) times daily as needed.   TEMAZEPAM (RESTORIL) 15 MG CAPSULE    Take 1 capsule (15 mg total) by mouth at bedtime as needed for sleep.  Modified Medications   No medications on file  Discontinued Medications   ACETAMINOPHEN (TYLENOL) 500 MG TABLET    Take 2 tablets (1,000 mg total) by mouth every 8 (eight) hours as needed.   ATENOLOL (TENORMIN) 25 MG TABLET    Take 1 tablet (25 mg total) by mouth 2 (two) times daily.   DOCUSATE SODIUM (COLACE) 100 MG CAPSULE    Take 100 mg by mouth daily.   FUROSEMIDE (LASIX) 40 MG TABLET    TAKE 1 TABLET BY MOUTH EVERY MORNING AND 1/2 EVERY EVENING.   MULTIPLE VITAMIN (MULTIVITAMIN WITH MINERALS) TABS TABLET    Take 1 tablet by mouth daily.   NITROGLYCERIN (NITROSTAT) 0.4 MG SL TABLET    DISSOLVE 1 TABLET UNDER THE TONGUE EVERY 5 MINUTES FOR 3 DOSES AS NEEDED  FOR  CHEST  PAIN     Physical Exam:  Vitals:   09/18/23 1321  Pulse: 78  Resp: 16  Temp: (!) 97.2 F (36.2 C)  SpO2: 92%  Weight: 106 lb (48.1 kg)  Height: 4\' 10"  (1.473 m)   Body mass index is 22.15 kg/m. Wt Readings from Last 3 Encounters:  09/18/23 106 lb (48.1 kg)  07/10/23 109 lb 9.6 oz (49.7 kg)  03/12/23 110 lb (49.9 kg)    Physical Exam Vitals reviewed.  Constitutional:      General: She is not in acute distress.    Comments: Frail appearance  HENT:     Head: Normocephalic.  Eyes:     General:        Right eye: No discharge.        Left eye: No discharge.  Cardiovascular:     Rate and Rhythm: Normal rate. Rhythm irregular.     Pulses: Normal pulses.     Heart sounds: Normal heart sounds.  Pulmonary:     Effort: Pulmonary effort is normal.     Breath sounds: Normal breath sounds.  Abdominal:     General: Bowel sounds are normal.     Palpations: Abdomen is soft.  Musculoskeletal:     Cervical back: Neck supple.     Right lower leg: No edema.     Left lower leg: No edema.  Skin:    General: Skin is warm.     Capillary Refill: Capillary refill takes less than 2 seconds.  Neurological:     General: No focal deficit present.     Mental Status: She is alert and oriented to person, place, and time.     Motor: Weakness present.     Gait: Gait abnormal.     Comments: walker  Psychiatric:        Mood and Affect: Mood normal.     Labs reviewed: Basic Metabolic Panel: Recent Labs    10/17/22 1613 10/23/22 1233 12/28/22 0509 03/12/23 2107 07/10/23 1435  NA 141   < > 133* 136  143  K 3.7   < > 4.2 3.1* 4.0  CL 99   < > 102 96* 101  CO2 35*   < > 24 28 31   GLUCOSE 92   < > 110* 99 85  BUN 11   < > 19 19 18   CREATININE 0.66   < > 0.95 0.60 0.86  CALCIUM 8.7   < > 7.0* 8.8* 8.9  TSH 0.24*  --   --   --  17.34*   < > = values in this interval not displayed.   Liver Function Tests: Recent Labs    10/23/22 1233 03/12/23 2107 07/10/23 1435  AST 23 33 24  ALT 13  25 16   ALKPHOS 79 88  --   BILITOT 0.6 0.8 0.8  PROT 7.5 7.0 7.2  ALBUMIN 4.0 3.9  --    No results for input(s): "LIPASE", "AMYLASE" in the last 8760 hours. No results for input(s): "AMMONIA" in the last 8760 hours. CBC: Recent Labs    10/23/22 1233 12/26/22 0040 12/27/22 0651 03/12/23 2107 07/10/23 1435  WBC 7.6 10.0 8.2 5.3 5.2  NEUTROABS 4.3 6.9  --   --  3,162  HGB 14.7 13.2 9.7* 14.0 14.3  HCT 42.8 39.1 29.3* 42.6 43.3  MCV 95.5 94.7 98.0 100.2* 98.6  PLT 186 208 151 219 214   Lipid Panel: No results for input(s): "CHOL", "HDL", "LDLCALC", "TRIG", "CHOLHDL", "LDLDIRECT" in the last 8760 hours. TSH: Recent Labs    10/17/22 1613 07/10/23 1435  TSH 0.24* 17.34*   A1C: Lab Results  Component Value Date   HGBA1C 5.8 (H) 07/09/2022     Assessment/Plan 1. Advanced care planning/counseling discussion (Primary) - progressive weakness and weight loss x 18 months - does not want future hospitalizations or aggressive medical interventions - recommend hospice> patient/caregiver agreeable - granddaughter will message me agency of choice - she has DNR  2. Hypotension, unspecified hypotension type - 1 episode of BP in 60's 01/28> EMS recommended hospital evaluation> patient refused - advised to hold furosemide until seen - suspect dehydration from furosemide use - today's pressure 114/80 - discontinue furosemide  3. Essential hypertension - see above - elevated pressures at night> ? Anxiety - she was taking extra atenolol when increased pressures occur - will increase evening atenolol> notify PCP if any dizziness or low BP - atenolol (TENORMIN) 25 MG tablet; Take 1 tablet (25 mg total) by mouth every morning AND 2 tablets (50 mg total) every evening.  Dispense: 90 tablet; Refill: 3 - Basic Metabolic Panel with eGFR  4. Weight loss - 01/2021 129 lbs - 01/2022 126 lbs - 02/2023 110 lbs - today's weight 106 lbs  - cont Boost  5. Hypothyroidism, unspecified  type - TSH 17.34 06/2023 - cont levothyroxine - TSH  6. Primary insomnia - cont temazepam  7. Chronic diastolic heart failure - see above - appears compensated after stopping furosemide - advised to contact PCP if shortness of breath or edema occur  Total time: 51 minutes. Greater than 50% of total time spent doing patient education regarding advanced directives, fluctuating blood pressure, weight loss, insomnia and hypothyroidism including symptom/medication management.    Next appt: 01/08/2024  Hazle Nordmann, Juel Burrow  [facility] [phone]

## 2023-09-19 ENCOUNTER — Encounter: Payer: Self-pay | Admitting: Orthopedic Surgery

## 2023-09-19 ENCOUNTER — Other Ambulatory Visit: Payer: Self-pay | Admitting: Orthopedic Surgery

## 2023-09-19 DIAGNOSIS — E039 Hypothyroidism, unspecified: Secondary | ICD-10-CM

## 2023-09-19 LAB — BASIC METABOLIC PANEL WITH GFR
BUN: 18 mg/dL (ref 7–25)
CO2: 33 mmol/L — ABNORMAL HIGH (ref 20–32)
Calcium: 8.9 mg/dL (ref 8.6–10.4)
Chloride: 98 mmol/L (ref 98–110)
Creat: 0.92 mg/dL (ref 0.60–0.95)
Glucose, Bld: 97 mg/dL (ref 65–139)
Potassium: 4.4 mmol/L (ref 3.5–5.3)
Sodium: 138 mmol/L (ref 135–146)
eGFR: 56 mL/min/{1.73_m2} — ABNORMAL LOW (ref >=60)

## 2023-09-19 LAB — TSH: TSH: 20.61 m[IU]/L — ABNORMAL HIGH (ref 0.40–4.50)

## 2023-09-19 MED ORDER — LEVOTHYROXINE SODIUM 88 MCG PO TABS
88.0000 ug | ORAL_TABLET | Freq: Every day | ORAL | 3 refills | Status: AC
Start: 2023-09-19 — End: ?

## 2023-10-14 ENCOUNTER — Telehealth: Payer: Self-pay | Admitting: Emergency Medicine

## 2023-10-14 ENCOUNTER — Other Ambulatory Visit: Payer: Self-pay | Admitting: Orthopedic Surgery

## 2023-10-14 DIAGNOSIS — R634 Abnormal weight loss: Secondary | ICD-10-CM

## 2023-10-14 NOTE — Telephone Encounter (Signed)
 This is fine. Thank you for assistance.

## 2023-10-14 NOTE — Telephone Encounter (Signed)
 I copy/pasted this into the referral- in response to [place] at [facility] requesting the Order. Is there anything more you want to add?  10/14/2023: Ambulatory referral to Hospice Order [id number]    09/18/2023 Office Visit Note with Hazle Nordmann, NP:   1. Advanced care planning/counseling discussion (Primary) - progressive weakness and weight loss x 18 months - does not want future hospitalizations or aggressive medical interventions - recommend hospice> patient/caregiver agreeable - granddaughter will message me agency of choice - she has DNR

## 2023-10-14 NOTE — Telephone Encounter (Signed)
 Kristen Whitehead with [facility] hospice called stating that they need an order and clinical x 6 months. He received call from daughter stating that she spoke with amy and had a recommendation for their services

## 2023-10-14 NOTE — Telephone Encounter (Signed)
 Will forward message to referral coordinator.

## 2023-10-14 NOTE — Telephone Encounter (Signed)
 Referral to [facility] in [place] made.

## 2023-11-08 ENCOUNTER — Other Ambulatory Visit: Payer: Self-pay | Admitting: Orthopedic Surgery

## 2023-11-08 ENCOUNTER — Other Ambulatory Visit: Payer: Self-pay | Admitting: Cardiovascular Disease

## 2023-11-08 DIAGNOSIS — K219 Gastro-esophageal reflux disease without esophagitis: Secondary | ICD-10-CM

## 2024-01-01 ENCOUNTER — Other Ambulatory Visit: Payer: Self-pay | Admitting: Cardiovascular Disease

## 2024-01-01 ENCOUNTER — Other Ambulatory Visit: Payer: Self-pay | Admitting: Orthopedic Surgery

## 2024-01-01 DIAGNOSIS — F419 Anxiety disorder, unspecified: Secondary | ICD-10-CM

## 2024-01-08 ENCOUNTER — Ambulatory Visit: Payer: Medicare Other | Admitting: Orthopedic Surgery

## 2024-01-31 ENCOUNTER — Other Ambulatory Visit: Payer: Self-pay | Admitting: Cardiovascular Disease

## 2024-02-03 ENCOUNTER — Other Ambulatory Visit: Payer: Self-pay | Admitting: Orthopedic Surgery

## 2024-02-03 DIAGNOSIS — F5101 Primary insomnia: Secondary | ICD-10-CM

## 2024-02-03 NOTE — Telephone Encounter (Signed)
 Patient has request for refill on Temazepam . Patient last refill was 08/04/2023. Patient has no contract on file. Patient has no upcoming appointments Medication pend and sent to PCP Celina Colla, Amy E, NP) for approval.

## 2024-02-10 ENCOUNTER — Telehealth: Payer: Self-pay

## 2024-02-10 NOTE — Transitions of Care (Post Inpatient/ED Visit) (Signed)
 02/10/2024  TOC RN called [facility] in [place] and spoke with staff who states patient transferred from their hospice to another hospice - TOC outreach not indicated   Patient ID: Kristen Whitehead, female   DOB: 1923/03/05, 89 y.o.   MRN: [id number]  Shona Prow RN, CCM [place]  VBCI-Population Health RN Care Manager [phone]

## 2024-02-25 ENCOUNTER — Ambulatory Visit: Payer: Self-pay | Admitting: *Deleted

## 2024-02-25 ENCOUNTER — Ambulatory Visit: Admitting: Adult Health

## 2024-02-25 VITALS — BP 118/72 | HR 77 | Temp 97.9°F | Resp 17 | Ht <= 58 in | Wt 101.0 lb

## 2024-02-25 DIAGNOSIS — I10 Essential (primary) hypertension: Secondary | ICD-10-CM

## 2024-02-25 DIAGNOSIS — E039 Hypothyroidism, unspecified: Secondary | ICD-10-CM

## 2024-02-25 DIAGNOSIS — I5032 Chronic diastolic (congestive) heart failure: Secondary | ICD-10-CM | POA: Diagnosis not present

## 2024-02-25 LAB — COMPREHENSIVE METABOLIC PANEL WITH GFR
AG Ratio: 1.4 (calc) (ref 1.0–2.5)
ALT: 15 U/L (ref 6–29)
AST: 27 U/L (ref 10–35)
Albumin: 3.8 g/dL (ref 3.6–5.1)
Alkaline phosphatase (APISO): 71 U/L (ref 37–153)
BUN: 24 mg/dL (ref 7–25)
CO2: 28 mmol/L (ref 20–32)
Calcium: 8.6 mg/dL (ref 8.6–10.4)
Chloride: 100 mmol/L (ref 98–110)
Creat: 0.76 mg/dL (ref 0.60–0.95)
Globulin: 2.7 g/dL (ref 1.9–3.7)
Glucose, Bld: 95 mg/dL (ref 65–99)
Potassium: 4.3 mmol/L (ref 3.5–5.3)
Sodium: 137 mmol/L (ref 135–146)
Total Bilirubin: 0.6 mg/dL (ref 0.2–1.2)
Total Protein: 6.5 g/dL (ref 6.1–8.1)
eGFR: 70 mL/min/1.73m2 (ref >=60)

## 2024-02-25 LAB — TSH: TSH: 3.66 m[IU]/L (ref 0.40–4.50)

## 2024-02-25 LAB — CBC
HCT: 40.7 % (ref 35.0–45.0)
Hemoglobin: 13.3 g/dL (ref 11.7–15.5)
MCH: 31.4 pg (ref 27.0–33.0)
MCHC: 32.7 g/dL (ref 32.0–36.0)
MCV: 96 fL (ref 80.0–100.0)
MPV: 9.5 fL (ref 7.5–12.5)
Platelets: 290 Thousand/uL (ref 140–400)
RBC: 4.24 Million/uL (ref 3.80–5.10)
RDW: 14 % (ref 11.0–15.0)
WBC: 5.7 Thousand/uL (ref 3.8–10.8)

## 2024-02-25 NOTE — Patient Instructions (Signed)
 Start taking omeprazole  20 mg twice daily before breakfast and supper

## 2024-02-25 NOTE — Telephone Encounter (Signed)
 FYI Only or Action Required?: FYI only for provider.  Patient was last seen in primary care on 09/18/2023 by Gil Greig BRAVO, NP.  Called Nurse Triage reporting Abdominal Pain.  Symptoms began several weeks ago.  Interventions attempted: Rest, hydration, or home remedies.  Symptoms are: gradually worsening.  Triage Disposition: See HCP Within 4 Hours (Or PCP Triage)  Patient/caregiver understands and will follow disposition?: yes Reason for Disposition  [1] MILD-MODERATE pain AND [2] constant AND [3] present > 2 hours  Answer Assessment - Initial Assessment Questions 1. LOCATION: Where does it hurt?      Under the rib- pressure - all across- made worse with eating, some constipation noted 2. RADIATION: Does the pain shoot anywhere else? (e.g., chest, back)     no 3. ONSET: When did the pain begin? (e.g., minutes, hours or days ago)      Several weeks- getting worse  5. PATTERN Does the pain come and go, or is it constant?    - If it comes and goes: How long does it last? Do you have pain now?     (Note: Comes and goes means the pain is intermittent. It goes away completely between bouts.)    - If constant: Is it getting better, staying the same, or getting worse?      (Note: Constant means the pain never goes away completely; most serious pain is constant and gets worse.)      Started- Comes and goes- more frequent- now constant- patient has decreased her intake due to the discomfort 6. SEVERITY: How bad is the pain?  (e.g., Scale 1-10; mild, moderate, or severe)    - MILD (1-3): Doesn't interfere with normal activities, abdomen soft and not tender to touch..     - MODERATE (4-7): Interferes with normal activities or awakens from sleep, abdomen tender to touch.     - SEVERE (8-10): Excruciating pain, doubled over, unable to do any normal activities.       Eating makes it worse- patient is finding it harder to eat, mild 7. RECURRENT SYMPTOM: Have you ever had this  type of stomach pain before? If Yes, ask: When was the last time? and What happened that time?      no 8. AGGRAVATING FACTORS: Does anything seem to cause this pain? (e.g., foods, stress, alcohol )     Eating makes it worse, walking is difficult 9. CARDIAC SYMPTOMS: Do you have any of the following symptoms: chest pain, difficulty breathing, sweating, nausea?     Bad episodes- may cause chest pain- 1 week ago EMS was called- but episode passes and patient declined ED 10. OTHER SYMPTOMS: Do you have any other symptoms? (e.g., back pain, diarrhea, fever, urination pain, vomiting)       constipation  Protocols used: Abdominal Pain - Upper-A-AH   Copied from CRM [id number]. Topic: Clinical - Red Word Triage >> Feb 25, 2024  9:26 AM Kristen Whitehead wrote: Red Word that prompted transfer to Nurse Triage: Patient states she's having stomach problems. The assisted living community that she's currently at, [facility], does not have a doctor there. Patient sees Dr. Gil but she is not on call nor in the office today per CAL. Patient states whenever she eats, she gets sick. She stated it started as a little pain a couple of weeks ago and she has a bad heart and this issue causes her heart to act up as well.

## 2024-02-25 NOTE — Progress Notes (Signed)
 Location:  [facility]   Place of Service:  psc clinic     CODE STATUS:   Allergies  Allergen Reactions   Azithromycin Nausea Only   Compazine [Prochlorperazine Edisylate] Nausea Only   Demerol Nausea Only   Dolophine [Methadone] Nausea Only   Ebastine Nausea Only    EBS   Erythromycin Nausea Only   Gluten Meal Diarrhea and Other (See Comments)    Has Celiac Disease   Ibuprofen Nausea Only   Meperidine Hcl Other (See Comments)    Reaction not noted   Morphine  Sulfate Itching   Other Diarrhea and Other (See Comments)    CELIAC DISEASE   Statins Other (See Comments)    Myalgias    Tetanus Toxoids Nausea Only   Tetracyclines & Related Nausea Only    Chief Complaint  Patient presents with   Abdominal Pain    HPI:  Has had abdominal pain epigastric area. For the past few weeks; while she was in [place] . Had pain for 3 days did call 911 but did not go to hospital. She is living in assisted living. Eating tends to make the pain worse. No nausea or vomiting; does have belching. She has not been taking her gas pills since being in assisted living.   Past Medical History:  Diagnosis Date   Acute on chronic diastolic CHF (congestive heart failure) (HCC) 02/01/2014   Bladder prolapse, female, acquired    Cancer (HCC)    thyroid    Chest pain    no known ischemic heart disease; negative Myoview  July 2013. EF 75% with no ischemia.    Chronic anticoagulation    Chronic atrial fibrillation (HCC)    managed with rate control and coumadin    Chronic diastolic heart failure (HCC)    GERD (gastroesophageal reflux disease)    Heart disease    History of congenital mitral regurgitation    mild   History of heart attack    Multiple    History of mitral valve prolapse 06/15/2008   a. echo 1/14: mild LVH, EF 60-65%, mild MR, mild to mod BAE, PASP 35   Hypercholesterolemia    Hypertension    Hypothyroidism    Stroke [facility])     Past Surgical History:  Procedure  Laterality Date   ABDOMINAL HYSTERECTOMY     CATARACT EXTRACTION     CHOLECYSTECTOMY     Thyroidectomy     TONSILLECTOMY      Social History   Socioeconomic History   Marital status: Widowed    Spouse name: Not on file   Number of children: 3   Years of education: BA   Highest education level: Bachelor's degree (e.g., BA, AB, BS)  Occupational History    Employer: RETIRED   Occupation: Retired Estate manager/land agent  Tobacco Use   Smoking status: Never   Smokeless tobacco: Never  Vaping Use   Vaping status: Never Used  Substance and Sexual Activity   Alcohol  use: No   Drug use: No   Sexual activity: Not Currently  Other Topics Concern   Not on file  Social History Narrative   Pt lives at home alone.   Caffeine Use: quit 86yrs ago      Diet: No Brien      Do you drink/ eat things with caffeineNo      Marital status:   Widowed  What year were you married ? 1947      Do you live in a house, apartment,assistred living, condo, trailer, etc.)? [address]      Is it one or more stories? 1      How many persons live in your home ? 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  No                            Type & how often       ADVANCED DIRECTIVES (Please bring copies)      Do you have a living will? Yes      Do you have a DNR form?   Yes                    If not, do you want to discuss one?       Do you have signed POA?HPOA forms?  Yes               If so, please bring to your appointment      FUNCTIONAL STATUS- To be completed by Spouse / child / Staff       Do you have difficulty bathing or dressing yourself ?  No      Do you have difficulty preparing food or eating ?  No      Do you have difficulty managing your mediation ?  No      Do you have difficulty managing your  finances ?  No      Do you have difficulty affording your medication ?  No      Social Drivers of Corporate investment banker Strain: Low Risk  (02/25/2024)   Overall Financial Resource Strain (CARDIA)    Difficulty of Paying Living Expenses: Not very hard  Food Insecurity: No Food Insecurity (02/25/2024)   Hunger Vital Sign    Worried About Running Out of Food in the Last Year: Never true    Ran Out of Food in the Last Year: Never true  Transportation Needs: No Transportation Needs (02/25/2024)   PRAPARE - Administrator, Civil Service (Medical): No    Lack of Transportation (Non-Medical): No  Physical Activity: Inactive (02/25/2024)   Exercise Vital Sign    Days of Exercise per Week: 0 days    Minutes of Exercise per Session: Not on file  Stress: Patient Declined (02/25/2024)   [employer] of Occupational Health - Occupational Stress Questionnaire    Feeling of Stress: Patient declined  Social Connections: Moderately Integrated (02/25/2024)   Social Connection and Isolation Panel    Frequency of Communication with Friends and Family: More than three times a week    Frequency of Social Gatherings with Friends and Family: Twice a week    Attends Religious Services: More than 4 times per year    Active Member of [employer] or Organizations: Yes    Attends Banker Meetings: More than 4 times per year    Marital Status: Widowed  Intimate Partner Violence: Not At Risk (12/26/2022)   Humiliation, Afraid, Rape, and Kick questionnaire    Fear of Current or Ex-Partner:  No    Emotionally Abused: No    Physically Abused: No    Sexually Abused: No   Family History  Problem Relation Age of Onset   Stroke Mother 51   Stroke Father 19   Arthritis Daughter    Gout Daughter    Immunocompromised Granddaughter    Heart attack Neg Hx    Heart disease Neg Hx       VITAL SIGNS BP 118/72   Pulse 77   Temp 97.9 F (36.6 C)   Resp 17   Ht 4' 10 (1.473 m)   Wt 101 lb  (45.8 kg)   SpO2 93%   BMI 21.11 kg/m   Outpatient Encounter Medications as of 02/25/2024  Medication Sig   atenolol  (TENORMIN ) 25 MG tablet Take 1 tablet (25 mg total) by mouth every morning AND 2 tablets (50 mg total) every evening.   busPIRone  (BUSPAR ) 5 MG tablet TAKE 1 TABLET(5 MG) BY MOUTH TWICE DAILY   dorzolamide  (TRUSOPT ) 2 % ophthalmic solution Place 1 drop into the left eye 2 (two) times daily.   ELIQUIS  2.5 MG TABS tablet TAKE 1 TABLET(2.5 MG) BY MOUTH TWICE DAILY   ezetimibe  (ZETIA ) 10 MG tablet TAKE 1 TABLET(10 MG) BY MOUTH DAILY   latanoprost  (XALATAN ) 0.005 % ophthalmic solution Place 1 drop into both eyes at bedtime.    levothyroxine  (SYNTHROID ) 88 MCG tablet Take 1 tablet (88 mcg total) by mouth daily.   omeprazole  (PRILOSEC) 20 MG capsule TAKE 1 CAPSULE(20 MG) BY MOUTH DAILY   PREMARIN vaginal cream Place 2 g vaginally 2 (two) times a week. Uses on Sunday and wednesday   Sodium Chloride , Hypertonic, (SODIUM CHLORIDE  OP) Place 1 drop into both eyes 3 (three) times daily as needed.   temazepam  (RESTORIL ) 15 MG capsule TAKE 1 CAPSULE BY MOUTH EVERY NIGHT AT BEDTIME AS NEEDED FOR SLEEP   No facility-administered encounter medications on file as of 02/25/2024.     SIGNIFICANT DIAGNOSTIC EXAMS  Review of Systems  Constitutional:  Negative for malaise/fatigue.  Respiratory:  Negative for cough and shortness of breath.   Cardiovascular:  Negative for chest pain, palpitations and leg swelling.  Gastrointestinal:  Positive for abdominal pain and heartburn. Negative for constipation, diarrhea, nausea and vomiting.  Musculoskeletal:  Negative for back pain, joint pain and myalgias.  Skin: Negative.   Neurological:  Negative for dizziness.  Psychiatric/Behavioral:  The patient is not nervous/anxious.     Physical Exam Constitutional:      General: She is not in acute distress.    Appearance: She is well-developed. She is not diaphoretic.  Neck:     Thyroid : No thyromegaly.   Cardiovascular:     Rate and Rhythm: Normal rate and regular rhythm.     Heart sounds: Normal heart sounds.  Pulmonary:     Effort: Pulmonary effort is normal. No respiratory distress.     Breath sounds: Normal breath sounds.  Abdominal:     General: Bowel sounds are decreased. There is no distension.     Palpations: Abdomen is soft.     Tenderness: There is abdominal tenderness in the left upper quadrant.  Musculoskeletal:        General: Normal range of motion.     Cervical back: Neck supple.     Right lower leg: No edema.     Left lower leg: No edema.  Lymphadenopathy:     Cervical: No cervical adenopathy.  Skin:    General: Skin is warm and dry.  Neurological:     Mental Status: She is alert. Mental status is at baseline.  Psychiatric:        Mood and Affect: Mood normal.     ASSESSMENT/ PLAN:  TODAY:   Abdominal pain: more than likely this represents worsening gerd; hiatal hernia which she has had in the past or constipation; which is not a problem for her. Will increase prilosec to 20 mg twice daily before meals and will begin gas x three times daily as needed    Barnie Seip NP [facility]  s call [phone]

## 2024-02-26 ENCOUNTER — Ambulatory Visit: Payer: Self-pay

## 2024-03-09 ENCOUNTER — Ambulatory Visit: Payer: Self-pay

## 2024-03-09 NOTE — Telephone Encounter (Signed)
 Patient was scheduled by E2C2 on 03/11/24

## 2024-03-09 NOTE — Telephone Encounter (Signed)
 FYI Only or Action Required?: FYI only for provider.  Patient was last seen in primary care on 02/25/2024 by Kristen Barnie RAMAN, NP.  Called Nurse Triage reporting Abdominal Pain.  Symptoms began 2-3 months.  Interventions attempted: Nothing.  Symptoms are: unchanged.  Triage Disposition: See PCP When Office is Open (Within 3 Days)  Patient/caregiver understands and will follow disposition?: Yes   Copied from CRM [id number]. Topic: Clinical - Red Word Triage >> Mar 09, 2024  8:47 AM Zane F wrote: Red Word that prompted transfer to Nurse Triage:   Stomach pain that she feels like its pressing against her ribs  Callback Number: [id number] Reason for Disposition  [1] Few streaks of blood in vomit AND [2] occurred one time  Answer Assessment - Initial Assessment Questions 1. LOCATION: Where does it hurt?      abd 2. RADIATION: Does the pain shoot anywhere else? (e.g., chest, back)     no 3. ONSET: When did the pain begin? (e.g., minutes, hours or days ago)      X 2 - 3 months 4. SUDDEN: Gradual or sudden onset?     gradual 5. PATTERN Does the pain come and go, or is it constant?     Comes and goes 6. SEVERITY: How bad is the pain?  (e.g., Scale 1-10; mild, moderate, or severe)     Unknown - pt states it hurts 7. RECURRENT SYMPTOM: Have you ever had this type of stomach pain before? If Yes, ask: When was the last time? and What happened that time?      no 8. CAUSE: What do you think is causing the stomach pain? (e.g., gallstones, recent abdominal surgery)     unknown 9. RELIEVING/AGGRAVATING FACTORS: What makes it better or worse? (e.g., antacids, bending or twisting motion, bowel movement)     no 10. OTHER SYMPTOMS: Do you have any other symptoms? (e.g., back pain, diarrhea, fever, urination pain, vomiting)       fever 11. PREGNANCY: Is there any chance you are pregnant? When was your last menstrual period?       Na  Feels like its pressing  against ribs  Protocols used: Abdominal Pain - Female-A-AH

## 2024-03-09 NOTE — Telephone Encounter (Signed)
 Gil Greig BRAVO, NP to Me (Selected Message)     03/09/24  9:29 AM It has been awhile since I have seen her. I thought she was hospice? If she is followed by hospice, I would recommend they contact them to evaluate her.    Spoke with patient and she sated that she is no longer under the care of hospice. Patient stated her son will be accompanying her to the appointment on 03/11/24

## 2024-03-11 ENCOUNTER — Ambulatory Visit (INDEPENDENT_AMBULATORY_CARE_PROVIDER_SITE_OTHER): Admitting: Orthopedic Surgery

## 2024-03-11 ENCOUNTER — Encounter: Payer: Self-pay | Admitting: Orthopedic Surgery

## 2024-03-11 ENCOUNTER — Ambulatory Visit
Admission: RE | Admit: 2024-03-11 | Discharge: 2024-03-11 | Disposition: A | Discharge status: Home / Self Care | Source: Ambulatory Visit | Attending: Orthopedic Surgery | Admitting: Orthopedic Surgery

## 2024-03-11 VITALS — BP 126/64 | HR 74 | Temp 97.0°F | Resp 16 | Ht <= 58 in | Wt 104.2 lb

## 2024-03-11 DIAGNOSIS — R1013 Epigastric pain: Secondary | ICD-10-CM

## 2024-03-11 DIAGNOSIS — R600 Localized edema: Secondary | ICD-10-CM | POA: Diagnosis not present

## 2024-03-11 LAB — CBC WITH DIFFERENTIAL/PLATELET
Absolute Lymphocytes: 1690 {cells}/uL (ref 850–3900)
Absolute Monocytes: 475 {cells}/uL (ref 200–950)
Basophils Absolute: 10 {cells}/uL (ref 0–200)
Basophils Relative: 0.2 %
Eosinophils Absolute: 163 {cells}/uL (ref 15–500)
Eosinophils Relative: 3.4 %
HCT: 39.5 % (ref 35.0–45.0)
Hemoglobin: 12.5 g/dL (ref 11.7–15.5)
MCH: 31.1 pg (ref 27.0–33.0)
MCHC: 31.6 g/dL — ABNORMAL LOW (ref 32.0–36.0)
MCV: 98.3 fL (ref 80.0–100.0)
MPV: 9.8 fL (ref 7.5–12.5)
Monocytes Relative: 9.9 %
Neutro Abs: 2462 {cells}/uL (ref 1500–7800)
Neutrophils Relative %: 51.3 %
Platelets: 201 Thousand/uL (ref 140–400)
RBC: 4.02 Million/uL (ref 3.80–5.10)
RDW: 14.4 % (ref 11.0–15.0)
Total Lymphocyte: 35.2 %
WBC: 4.8 Thousand/uL (ref 3.8–10.8)

## 2024-03-11 LAB — COMPREHENSIVE METABOLIC PANEL WITH GFR
AG Ratio: 1.5 (calc) (ref 1.0–2.5)
ALT: 17 U/L (ref 6–29)
AST: 25 U/L (ref 10–35)
Albumin: 3.9 g/dL (ref 3.6–5.1)
Alkaline phosphatase (APISO): 81 U/L (ref 37–153)
BUN: 20 mg/dL (ref 7–25)
CO2: 33 mmol/L — ABNORMAL HIGH (ref 20–32)
Calcium: 8.3 mg/dL — ABNORMAL LOW (ref 8.6–10.4)
Chloride: 99 mmol/L (ref 98–110)
Creat: 0.66 mg/dL (ref 0.60–0.95)
Globulin: 2.6 g/dL (ref 1.9–3.7)
Glucose, Bld: 102 mg/dL — ABNORMAL HIGH (ref 65–99)
Potassium: 4.3 mmol/L (ref 3.5–5.3)
Sodium: 138 mmol/L (ref 135–146)
Total Bilirubin: 0.7 mg/dL (ref 0.2–1.2)
Total Protein: 6.5 g/dL (ref 6.1–8.1)
eGFR: 79 mL/min/1.73m2 (ref >=60)

## 2024-03-11 LAB — LIPASE: Lipase: 47 U/L (ref 7–60)

## 2024-03-11 LAB — AMYLASE: Amylase: 31 U/L (ref 21–101)

## 2024-03-11 MED ORDER — SIMETHICONE 80 MG PO CHEW: 80.0000 mg | CHEWABLE_TABLET | Freq: Two times a day (BID) | ORAL | 0 refills | Status: AC | PRN

## 2024-03-11 NOTE — Progress Notes (Unsigned)
 Careteam: Patient Care Team: Gil Greig BRAVO, NP as PCP - General (Adult Health Nurse Practitioner) O'Neal, Darryle Ned, MD as PCP - Cardiology (Cardiology) O'Neal, Darryle Ned, MD as Consulting Physician (Cardiology) Fleeta Zerita DASEN, MD as Consulting Physician (Ophthalmology) Tobie Baptist, MD as Consulting Physician (Ophthalmology)  Seen by: Greig Gil, AGNP-C  PLACE OF SERVICE:  [facility]  Advanced Directive information Does Patient Have a Medical Advance Directive?: Yes, Type of Advance Directive: Healthcare Power of [place];Living will;Out of facility DNR (pink MOST or yellow form), Does patient want to make changes to medical advance directive?: No - Patient declined  Allergies  Allergen Reactions   Azithromycin Nausea Only   Compazine [Prochlorperazine Edisylate] Nausea Only   Demerol Nausea Only   Dolophine [Methadone] Nausea Only   Ebastine Nausea Only    EBS   Erythromycin Nausea Only   Gluten Meal Diarrhea and Other (See Comments)    Has Celiac Disease   Ibuprofen Nausea Only   Meperidine Hcl Other (See Comments)    Reaction not noted   Morphine  Sulfate Itching   Other Diarrhea and Other (See Comments)    CELIAC DISEASE   Statins Other (See Comments)    Myalgias    Tetanus Toxoids Nausea Only   Tetracyclines & Related Nausea Only    Chief Complaint  Patient presents with   Abdominal Pain    Patient complains of abdominal pain. Patient states this has been ongoing for 6 months.     HPI: Patient is a 89 y.o. female seen today for acute visit due to abdominal pain.   Discussed the use of AI scribe software for clinical note transcription with the patient, who gave verbal consent to proceed.  History of Present Illness   Kristen Whitehead is a 89 year old female with a hiatal hernia who presents with ongoing abdominal pain.  Hospice services discontinued a few months ago.   She has experienced abdominal pain for about six months, described as a  'belt going halfway around' her upper abdomen, causing her stomach to feel 'real hard' and her ribs to 'stick out.' The pain sometimes radiates up her sternum and over her shoulder, primarily affecting her right side. Initially occurring about once a week, the episodes have become more frequent. Some episodes are associated with overeating, and she has gained four to five pounds, which she finds uncomfortable.  She has a known history of a hiatal hernia, which was previously bothersome but had not been a significant issue until recently. She has been taking omeprazole  and Gas-X for symptom management, but the pain 'comes back' despite these medications. She has also been experiencing irregular bowel movements, which she describes as 'all loused up.'  She recently moved to an assisted living facility, which has changed her meal schedule and diet. She mentions eating more consistently and having access to a variety of foods, including vegetables and desserts. She has a history of celiac's gluten intolerance and has been cautious about her diet, opting for vegetables without additional seasoning when uncertain about gluten content.  She has a history of edema in her legs, which has improved since discontinuing Lasix . She has been more active, walking more frequently.        Review of Systems:  Review of Systems  Constitutional: Negative.   HENT: Negative.    Respiratory: Negative.    Cardiovascular: Negative.   Gastrointestinal:  Positive for abdominal pain, constipation and heartburn. Negative for blood in stool, diarrhea, nausea and  vomiting.  Genitourinary: Negative.   Musculoskeletal: Negative.   Skin: Negative.   Neurological: Negative.   Psychiatric/Behavioral: Negative.      Past Medical History:  Diagnosis Date   Acute on chronic diastolic CHF (congestive heart failure) (HCC) 02/01/2014   Bladder prolapse, female, acquired    Cancer [facility])    thyroid    Chest pain    no known  ischemic heart disease; negative Myoview  July 2013. EF 75% with no ischemia.    Chronic anticoagulation    Chronic atrial fibrillation (HCC)    managed with rate control and coumadin    Chronic diastolic heart failure (HCC)    GERD (gastroesophageal reflux disease)    Heart disease    History of congenital mitral regurgitation    mild   History of heart attack    Multiple    History of mitral valve prolapse 06/15/2008   a. echo 1/14: mild LVH, EF 60-65%, mild MR, mild to mod BAE, PASP 35   Hypercholesterolemia    Hypertension    Hypothyroidism    Stroke [facility])    Past Surgical History:  Procedure Laterality Date   ABDOMINAL HYSTERECTOMY     CATARACT EXTRACTION     CHOLECYSTECTOMY     Thyroidectomy     TONSILLECTOMY     Social History:   reports that she has never smoked. She has never used smokeless tobacco. She reports that she does not drink alcohol  and does not use drugs.  Family History  Problem Relation Age of Onset   Stroke Mother 75   Stroke Father 41   Arthritis Daughter    Gout Daughter    Immunocompromised Granddaughter    Heart attack Neg Hx    Heart disease Neg Hx     Medications: Patient's Medications  New Prescriptions   No medications on file  Previous Medications   ATENOLOL  (TENORMIN ) 25 MG TABLET    Take 1 tablet (25 mg total) by mouth every morning AND 2 tablets (50 mg total) every evening.   BUSPIRONE  (BUSPAR ) 5 MG TABLET    TAKE 1 TABLET(5 MG) BY MOUTH TWICE DAILY   DORZOLAMIDE  (TRUSOPT ) 2 % OPHTHALMIC SOLUTION    Place 1 drop into the left eye 2 (two) times daily.   ELIQUIS  2.5 MG TABS TABLET    TAKE 1 TABLET(2.5 MG) BY MOUTH TWICE DAILY   EZETIMIBE  (ZETIA ) 10 MG TABLET    TAKE 1 TABLET(10 MG) BY MOUTH DAILY   LATANOPROST  (XALATAN ) 0.005 % OPHTHALMIC SOLUTION    Place 1 drop into both eyes at bedtime.    LEVOTHYROXINE  (SYNTHROID ) 88 MCG TABLET    Take 1 tablet (88 mcg total) by mouth daily.   PREMARIN VAGINAL CREAM    Place 2 g vaginally 2 (two)  times a week. Uses on Sunday and wednesday   SODIUM CHLORIDE , HYPERTONIC, (SODIUM CHLORIDE  OP)    Place 1 drop into both eyes 3 (three) times daily as needed.   TEMAZEPAM  (RESTORIL ) 15 MG CAPSULE    TAKE 1 CAPSULE BY MOUTH EVERY NIGHT AT BEDTIME AS NEEDED FOR SLEEP  Modified Medications   No medications on file  Discontinued Medications   No medications on file    Physical Exam:  Vitals:   03/11/24 1252  BP: 126/64  Pulse: 74  Resp: 16  Temp: (!) 97 F (36.1 C)  SpO2: 94%  Weight: 104 lb 3.2 oz (47.3 kg)  Height: 4' 10 (1.473 m)   Body mass index is 21.78 kg/m. Wt  Readings from Last 3 Encounters:  03/11/24 104 lb 3.2 oz (47.3 kg)  02/25/24 101 lb (45.8 kg)  09/18/23 106 lb (48.1 kg)    Physical Exam Vitals reviewed.  Constitutional:      General: She is not in acute distress. HENT:     Head: Normocephalic.  Cardiovascular:     Rate and Rhythm: Normal rate and regular rhythm.     Pulses: Normal pulses.     Heart sounds: Normal heart sounds.  Pulmonary:     Effort: Pulmonary effort is normal.     Breath sounds: Normal breath sounds.  Chest:     Comments: Increased tenderness under left and right floating ribs Abdominal:     General: Bowel sounds are normal. There is no distension.     Palpations: Abdomen is rigid. There is no mass.     Tenderness: There is abdominal tenderness in the epigastric area.     Hernia: No hernia is present.  Musculoskeletal:     Right lower leg: No edema.     Left lower leg: No edema.  Skin:    General: Skin is warm.     Capillary Refill: Capillary refill takes less than 2 seconds.  Neurological:     General: No focal deficit present.     Mental Status: She is alert and oriented to person, place, and time.     Gait: Gait abnormal.  Psychiatric:        Mood and Affect: Mood normal.     Labs reviewed: Basic Metabolic Panel: Recent Labs    07/10/23 1435 09/18/23 1404 02/25/24 1514  NA 143 138 137  K 4.0 4.4 4.3  CL 101  98 100  CO2 31 33* 28  GLUCOSE 85 97 95  BUN 18 18 24   CREATININE 0.86 0.92 0.76  CALCIUM  8.9 8.9 8.6  TSH 17.34* 20.61* 3.66   Liver Function Tests: Recent Labs    03/12/23 2107 07/10/23 1435 02/25/24 1514  AST 33 24 27  ALT 25 16 15   ALKPHOS 88  --   --   BILITOT 0.8 0.8 0.6  PROT 7.0 7.2 6.5  ALBUMIN 3.9  --   --    No results for input(s): LIPASE, AMYLASE in the last 8760 hours. No results for input(s): AMMONIA in the last 8760 hours. CBC: Recent Labs    03/12/23 2107 07/10/23 1435 02/25/24 1514  WBC 5.3 5.2 5.7  NEUTROABS  --  3,162  --   HGB 14.0 14.3 13.3  HCT 42.6 43.3 40.7  MCV 100.2* 98.6 96.0  PLT 219 214 290   Lipid Panel: No results for input(s): CHOL, HDL, LDLCALC, TRIG, CHOLHDL, LDLDIRECT in the last 8760 hours. TSH: Recent Labs    07/10/23 1435 09/18/23 1404 02/25/24 1514  TSH 17.34* 20.61* 3.66   A1C: Lab Results  Component Value Date   HGBA1C 5.8 (H) 07/09/2022     Assessment/Plan 1. Epigastric pain (Primary) - onset 6 months, averaging 1 episode per week - abdomen rigid, tenderness to epigastric/ rib area - h/o celiacs and hiatal hernia - CMP> alk phos, AST/ALT unremarkable - Lipase> normal - Amylase> normal - CBC with Differential/Platelet> WBC 4.8, hgb 12.5 - plan for KUB and CXR to r/o constipation/ mass/ irregularity - recommend journal when incidents occur to recognize pattern - cont Gas-X prn - cont omeprazole   - DG Abd 1 View; Future - simethicone  (MYLICON) 80 MG chewable tablet; Chew 1 tablet (80 mg total) by mouth 2 (two) times  daily as needed for flatulence. Ok to self administer  Dispense: 30 tablet; Refill: 0 - DG Chest 2 View; Future  2. Bilateral leg edema - resolved - off furosemide   Total time: 36 minutes. Greater than 50% of total time spent doing patient education regarding ongoing abdominal pain and lower leg edema including symptom/medication management.      Next appt: 03/25/2024   Greig Whitehead, Kristen  [facility] [phone]

## 2024-03-11 NOTE — Patient Instructions (Signed)
 Try a journal to capture what happened when abdominal pain occurred  You may take Gas-X as needed  Try smaller portions when eating> do not over eat  Find out pharmacy Fredick is using   Continue omeprazole  twice daily

## 2024-03-12 ENCOUNTER — Ambulatory Visit: Payer: Self-pay | Admitting: Orthopedic Surgery

## 2024-03-16 ENCOUNTER — Other Ambulatory Visit: Payer: Self-pay | Admitting: Orthopedic Surgery

## 2024-03-16 MED ORDER — POLYETHYLENE GLYCOL 3350 17 G PO PACK: 17.0000 g | PACK | Freq: Every day | ORAL | Status: AC | PRN

## 2024-03-25 ENCOUNTER — Ambulatory Visit: Admitting: Orthopedic Surgery

## 2024-04-08 ENCOUNTER — Telehealth: Payer: Self-pay

## 2024-04-08 NOTE — Telephone Encounter (Signed)
 Copied from CRM [id number]. Topic: Clinical - Medication Question >> Apr 08, 2024  3:16 PM Carrielelia G wrote: Questioning if medications are listed on my list: Sodium Chloride , Hypertonic, (SODIUM CHLORIDE  OP) and Gas X. Patient states she needs this information to go to the nurse or administrator at [facility] [phone]  Please advise

## 2024-04-08 NOTE — Telephone Encounter (Signed)
 Spoke with patient, patient aware that she can access her medication list via mychart. Patient stated she does not have a computer to access mychart. Patient asked that we call the nurse at [facility] to inform them of all the medications she is taking.   I called [facility] at the number below, spoke with the nurse, and she said that Kristen Whitehead is confused,  they did not need for us  to call them, as they have all of her active medications.  I asked if I should contact a family member and the nurse stated patients son visited her yesterday.  I called Kristen Whitehead, patients son (no answer) and no voicemail picked up. Reason for call was to see if an appointment is needed to address confusion. Routing to PCP as a FYI

## 2024-04-14 ENCOUNTER — Other Ambulatory Visit: Payer: Self-pay | Admitting: Orthopedic Surgery

## 2024-04-14 DIAGNOSIS — F5101 Primary insomnia: Secondary | ICD-10-CM

## 2024-04-14 MED ORDER — TEMAZEPAM 15 MG PO CAPS: 15.0000 mg | ORAL_CAPSULE | Freq: Every evening | ORAL | 1 refills | Status: DC | PRN

## 2024-04-14 NOTE — Telephone Encounter (Signed)
 Copied from CRM #[id number]. Topic: Clinical - Medication Refill >> Apr 14, 2024  3:05 PM Carmell R wrote: Medication: temazepam  (RESTORIL ) 15 MG capsule  Has the patient contacted their pharmacy? Yes, Med tech Jama says the patient is unable to get the medication from the pharmacy where there are refills. They are changing the pharmacy.  This is the patient's preferred pharmacy:  [facility] - [place], [place] - [phone] [address] [place] [address] Phone: [phone] Fax: [phone]  Is this the correct pharmacy for this prescription? Yes If no, delete pharmacy and type the correct one.   Has the prescription been filled recently? Yes  Is the patient out of the medication? Yes and asks that this be expedited/high priority  Has the patient been seen for an appointment in the last year OR does the patient have an upcoming appointment? Yes  Can we respond through MyChart? Yes  Agent: Please be advised that Rx refills may take up to 3 business days. We ask that you follow-up with your pharmacy.

## 2024-04-22 ENCOUNTER — Telehealth: Payer: Self-pay

## 2024-04-22 NOTE — Telephone Encounter (Signed)
 Copied from CRM [id number]. Topic: Clinical - Prescription Issue >> Apr 22, 2024  3:52 PM Kristen Whitehead wrote: Reason for CRM: Patient called to ask the provider could he call the facility and speak with the nurse who is there until 5 on Tuesday and Thursday.  She stated that the provider needs to call and let them know that she can have her eye drops on her and take them as needed. She said her eyes get dry and she needs them but has to walk so far to get them. Nurse line and you can leave a message [phone]

## 2024-04-22 NOTE — Telephone Encounter (Signed)
 Message routed to PCP Gil Greig BRAVO, NP . Please advise.

## 2024-04-22 NOTE — Telephone Encounter (Signed)
 Called facility and was unable to leave message. Ok to self administer eye drops dorzolamide  and latanoprost .

## 2024-04-23 NOTE — Telephone Encounter (Signed)
 Tried Academic librarian line, No answer and cannot LM.

## 2024-04-27 ENCOUNTER — Encounter: Payer: Self-pay | Admitting: Orthopedic Surgery

## 2024-04-27 ENCOUNTER — Other Ambulatory Visit: Payer: Self-pay | Admitting: Orthopedic Surgery

## 2024-04-27 NOTE — Telephone Encounter (Signed)
Message routed to PCP Fargo, Amy E, NP  

## 2024-04-27 NOTE — Telephone Encounter (Signed)
 I have faxed new order to facility. Message routed to PCP Gil Greig BRAVO, NP as RICK.

## 2024-04-30 ENCOUNTER — Other Ambulatory Visit: Payer: Self-pay | Admitting: Orthopedic Surgery

## 2024-04-30 ENCOUNTER — Telehealth: Payer: Self-pay

## 2024-04-30 MED ORDER — GENTEAL TEARS NIGHT-TIME OP OINT: 1.0000 | TOPICAL_OINTMENT | Freq: Every day | OPHTHALMIC | 11 refills | Status: AC

## 2024-04-30 NOTE — Telephone Encounter (Signed)
 Copied from CRM #[id number]. Topic: Clinical - Prescription Issue >> Apr 30, 2024  9:40 AM Laurier BROCKS wrote: Reason for CRM: Helmut  from [facility] [phone] called to advise the following script for: White Petrolatum-Mineral Oil (GENTEAL TEARS NIGHT-TIME) OINT did not include the providers signature to make it an active order and the script does not include the dosage amount/ which eye the patient should be using the medication for.

## 2024-04-30 NOTE — Telephone Encounter (Signed)
 Order faxed to [phone]. I also tried to return call, was sent to Carressa's personal line, no answer and no voicemail picked up.

## 2024-04-30 NOTE — Telephone Encounter (Signed)
 Amy please advise  I added both eye and 1 strip to rx,if this is correct please sign, and once printed I will stamp and fax

## 2024-05-13 ENCOUNTER — Encounter: Payer: Self-pay | Admitting: Orthopedic Surgery

## 2024-05-13 ENCOUNTER — Ambulatory Visit (INDEPENDENT_AMBULATORY_CARE_PROVIDER_SITE_OTHER): Admitting: Orthopedic Surgery

## 2024-05-13 VITALS — BP 110/82 | HR 65 | Temp 96.7°F | Resp 16 | Ht <= 58 in | Wt 99.2 lb

## 2024-05-13 DIAGNOSIS — R634 Abnormal weight loss: Secondary | ICD-10-CM | POA: Diagnosis not present

## 2024-05-13 DIAGNOSIS — R1013 Epigastric pain: Secondary | ICD-10-CM | POA: Diagnosis not present

## 2024-05-13 DIAGNOSIS — Z23 Encounter for immunization: Secondary | ICD-10-CM

## 2024-05-13 DIAGNOSIS — K5901 Slow transit constipation: Secondary | ICD-10-CM | POA: Diagnosis not present

## 2024-05-13 DIAGNOSIS — I4821 Permanent atrial fibrillation: Secondary | ICD-10-CM

## 2024-05-13 DIAGNOSIS — H04123 Dry eye syndrome of bilateral lacrimal glands: Secondary | ICD-10-CM | POA: Diagnosis not present

## 2024-05-13 MED ORDER — ARTIFICIAL TEARS OP SOLN: 1.0000 [drp] | Freq: Four times a day (QID) | OPHTHALMIC | 12 refills | Status: AC | PRN

## 2024-05-13 MED ORDER — ARTIFICIAL TEARS OP SOLN: 1.0000 [drp] | Freq: Four times a day (QID) | OPHTHALMIC | 12 refills | Status: DC | PRN

## 2024-05-13 NOTE — Patient Instructions (Addendum)
 Flu vaccine given today   May have artificial tears in room  as needed   Drink more water  Eat more chocolate or calories

## 2024-05-14 NOTE — Progress Notes (Addendum)
 Careteam: Patient Care Team: Gil Greig BRAVO, NP as PCP - General (Adult Health Nurse Practitioner) O'Neal, Darryle Ned, MD as PCP - Cardiology (Cardiology) O'Neal, Darryle Ned, MD as Consulting Physician (Cardiology) Fleeta Zerita DASEN, MD as Consulting Physician (Ophthalmology) Tobie Baptist, MD as Consulting Physician (Ophthalmology)  Seen by: Greig Gil, AGNP-C  PLACE OF SERVICE:  [facility]  Advanced Directive information Does Patient Have a Medical Advance Directive?: Yes, Type of Advance Directive: Out of facility DNR (pink MOST or yellow form), Does patient want to make changes to medical advance directive?: No - Patient declined  Allergies  Allergen Reactions   Azithromycin Nausea Only   Compazine [Prochlorperazine Edisylate] Nausea Only   Demerol Nausea Only   Dolophine [Methadone] Nausea Only   Ebastine Nausea Only    EBS   Erythromycin Nausea Only   Gluten Meal Diarrhea and Other (See Comments)    Has Celiac Disease   Ibuprofen Nausea Only   Meperidine Hcl Other (See Comments)    Reaction not noted   Morphine  Sulfate Itching   Other Diarrhea and Other (See Comments)    CELIAC DISEASE   Statins Other (See Comments)    Myalgias    Tetanus Toxoid-Containing Vaccines Nausea Only   Tetracyclines & Related Nausea Only    Chief Complaint  Patient presents with   Medical Management of Chronic Issues    2 month follow up. Discuss the need for DTAP vaccine, Influenza vaccine, Covid Booster, and AWV.     HPI: Patient is a 89 y.o. female seen today for medical management of chronic conditions.   Discussed the use of AI scribe software for clinical note transcription with the patient, who gave verbal consent to proceed.  History of Present Illness   Kristen Whitehead is a 89 year old female who presents with weight loss and constipation.  She has experienced weight loss, decreasing from 104 pounds to 99 pounds since her last visit. She attributes difficulty  in gaining weight to dietary restrictions, specifically an inability to consume wheat products due to gluten intolerance. Her diet includes oatmeal, scrambled eggs, and whole milk for breakfast, supplemented with ice cream and Boost drinks when available. She is interested in finding high-calorie, gluten-free foods to aid in weight gain.  She experiences constipation, with bowel movements occurring every three days, managed with daily Miralax  and occasional Dulcolax. Her bowel movements are irregular, with episodes of diarrhea and gas. She maintains a diary to track her bowel movements and ensures not exceeding three days without a movement.  She drinks water with meals but acknowledges it may not be sufficient, consuming four to five glasses daily.   She reports dry eyes, particularly in the afternoon, and uses artificial tears as needed. She expresses frustration with the accessibility of her medications, especially those prescribed for as-needed use.  No chest pain or shortness of breath, though she notes occasional shortness of breath when lying down, requiring her to elevate her bed. No recent falls. She mentions a history of leg swelling and a lump on her body present for a couple of months, described as hard and fixed.  She resides in an assisted living facility, noting challenges with medication management and dietary accommodations.       Review of Systems:  Review of Systems  Constitutional: Negative.   HENT: Negative.    Respiratory:  Positive for shortness of breath. Negative for wheezing.   Cardiovascular:  Positive for leg swelling. Negative for chest pain.  Gastrointestinal:  Positive for constipation and diarrhea.  Genitourinary:  Negative for dysuria.  Musculoskeletal:  Negative for falls and joint pain.  Neurological:  Positive for weakness. Negative for dizziness and headaches.  Psychiatric/Behavioral:  Negative for depression and memory loss. The patient is not  nervous/anxious and does not have insomnia.     Past Medical History:  Diagnosis Date   Acute on chronic diastolic CHF (congestive heart failure) (HCC) 02/01/2014   Bladder prolapse, female, acquired    Cancer [facility])    thyroid    Chest pain    no known ischemic heart disease; negative Myoview  July 2013. EF 75% with no ischemia.    Chronic anticoagulation    Chronic atrial fibrillation (HCC)    managed with rate control and coumadin    Chronic diastolic heart failure (HCC)    GERD (gastroesophageal reflux disease)    Heart disease    History of congenital mitral regurgitation    mild   History of heart attack    Multiple    History of mitral valve prolapse 06/15/2008   a. echo 1/14: mild LVH, EF 60-65%, mild MR, mild to mod BAE, PASP 35   Hypercholesterolemia    Hypertension    Hypothyroidism    Stroke [facility])    Past Surgical History:  Procedure Laterality Date   ABDOMINAL HYSTERECTOMY     CATARACT EXTRACTION     CHOLECYSTECTOMY     Thyroidectomy     TONSILLECTOMY     Social History:   reports that she has never smoked. She has never used smokeless tobacco. She reports that she does not drink alcohol  and does not use drugs.  Family History  Problem Relation Age of Onset   Stroke Mother 52   Stroke Father 52   Arthritis Daughter    Gout Daughter    Immunocompromised Granddaughter    Heart attack Neg Hx    Heart disease Neg Hx     Medications: Patient's Medications  New Prescriptions   ARTIFICIAL TEARS OPHTHALMIC SOLUTION    Place 1 drop into both eyes 4 (four) times daily as needed (patient may self administer in her room).  Previous Medications   ACETAMINOPHEN  (TYLENOL ) 500 MG TABLET    Take 1,000 mg by mouth 3 (three) times daily as needed for mild pain (pain score 1-3), moderate pain (pain score 4-6), fever or headache.   ATENOLOL  (TENORMIN ) 25 MG TABLET    Take 1 tablet (25 mg total) by mouth every morning AND 2 tablets (50 mg total) every evening.   BISACODYL   (DULCOLAX) 5 MG EC TABLET    Take 5 mg by mouth daily as needed for moderate constipation.   BUSPIRONE  (BUSPAR ) 5 MG TABLET    TAKE 1 TABLET(5 MG) BY MOUTH TWICE DAILY   DORZOLAMIDE  (TRUSOPT ) 2 % OPHTHALMIC SOLUTION    Place 1 drop into the left eye 2 (two) times daily.   ELIQUIS  2.5 MG TABS TABLET    TAKE 1 TABLET(2.5 MG) BY MOUTH TWICE DAILY   LATANOPROST  (XALATAN ) 0.005 % OPHTHALMIC SOLUTION    Place 1 drop into both eyes at bedtime.    LEVOTHYROXINE  (SYNTHROID ) 88 MCG TABLET    Take 1 tablet (88 mcg total) by mouth daily.   OMEPRAZOLE  (PRILOSEC) 20 MG CAPSULE    Take 20 mg by mouth 2 (two) times daily before a meal.   POLYETHYLENE GLYCOL (MIRALAX ) 17 G PACKET    Take 17 g by mouth daily as needed. Give 07/30 & 07/31 due  to constipation, then daily prn   PREMARIN VAGINAL CREAM    Place 2 g vaginally 2 (two) times a week. Uses on Sunday and wednesday   SIMETHICONE  (MYLICON) 80 MG CHEWABLE TABLET    Chew 1 tablet (80 mg total) by mouth 2 (two) times daily as needed for flatulence. Ok to self administer   TEMAZEPAM  (RESTORIL ) 15 MG CAPSULE    Take 1 capsule (15 mg total) by mouth at bedtime as needed for sleep.   WHITE PETROLATUM-MINERAL OIL (GENTEAL TEARS NIGHT-TIME) OINT    Apply 1 strip to eye at bedtime. Patient may self administer at bedtime Both Eyes  Modified Medications   No medications on file  Discontinued Medications   EZETIMIBE  (ZETIA ) 10 MG TABLET    TAKE 1 TABLET(10 MG) BY MOUTH DAILY   HYDROXYPROPYL METHYLCELLULOSE / HYPROMELLOSE (ISOPTO TEARS / GONIOVISC) 2.5 % OPHTHALMIC SOLUTION    1 drop 4 (four) times daily as needed for dry eyes (may self administer and keep in room).   SODIUM CHLORIDE , HYPERTONIC, (SODIUM CHLORIDE  OP)    Place 1 drop into both eyes 3 (three) times daily as needed.    Physical Exam:  Vitals:   05/13/24 1318  BP: 110/82  Pulse: 65  Resp: 16  Temp: (!) 96.7 F (35.9 C)  SpO2: 96%  Weight: 99 lb 3.2 oz (45 kg)  Height: 4' 10 (1.473 m)   Body mass  index is 20.73 kg/m. Wt Readings from Last 3 Encounters:  05/13/24 99 lb 3.2 oz (45 kg)  03/11/24 104 lb 3.2 oz (47.3 kg)  02/25/24 101 lb (45.8 kg)    Physical Exam Vitals reviewed.  Constitutional:      General: She is not in acute distress. HENT:     Head: Normocephalic.  Eyes:     General:        Right eye: No discharge.        Left eye: No discharge.  Cardiovascular:     Rate and Rhythm: Normal rate and regular rhythm.     Pulses: Normal pulses.     Heart sounds: Normal heart sounds.  Pulmonary:     Effort: Pulmonary effort is normal. No respiratory distress.     Breath sounds: Normal breath sounds. No wheezing or rales.  Abdominal:     General: Bowel sounds are normal. There is no distension.     Palpations: Abdomen is soft.     Tenderness: There is no abdominal tenderness.  Musculoskeletal:     Cervical back: Neck supple.     Right lower leg: No edema.     Left lower leg: No edema.  Skin:    General: Skin is warm.     Capillary Refill: Capillary refill takes less than 2 seconds.  Neurological:     General: No focal deficit present.     Mental Status: She is alert and oriented to person, place, and time.     Gait: Gait abnormal.  Psychiatric:        Mood and Affect: Mood normal.     Labs reviewed: Basic Metabolic Panel: Recent Labs    07/10/23 1435 09/18/23 1404 02/25/24 1514 03/11/24 1349  NA 143 138 137 138  K 4.0 4.4 4.3 4.3  CL 101 98 100 99  CO2 31 33* 28 33*  GLUCOSE 85 97 95 102*  BUN 18 18 24 20   CREATININE 0.86 0.92 0.76 0.66  CALCIUM  8.9 8.9 8.6 8.3*  TSH 17.34* 20.61* 3.66  --  Liver Function Tests: Recent Labs    07/10/23 1435 02/25/24 1514 03/11/24 1349  AST 24 27 25   ALT 16 15 17   BILITOT 0.8 0.6 0.7  PROT 7.2 6.5 6.5   Recent Labs    03/11/24 1349  LIPASE 47  AMYLASE 31   No results for input(s): AMMONIA in the last 8760 hours. CBC: Recent Labs    07/10/23 1435 02/25/24 1514 03/11/24 1349  WBC 5.2 5.7 4.8   NEUTROABS 3,162  --  2,462  HGB 14.3 13.3 12.5  HCT 43.3 40.7 39.5  MCV 98.6 96.0 98.3  PLT 214 290 201   Lipid Panel: No results for input(s): CHOL, HDL, LDLCALC, TRIG, CHOLHDL, LDLDIRECT in the last 8760 hours. TSH: Recent Labs    07/10/23 1435 09/18/23 1404 02/25/24 1514  TSH 17.34* 20.61* 3.66   A1C: Lab Results  Component Value Date   HGBA1C 5.8 (H) 07/09/2022     Assessment/Plan 1. Immunization due (Primary) - Flu vaccine HIGH DOSE PF(Fluzone Trivalent)  2. Epigastric pain - 07/29 KUB noted constipation - does not drink water well - moving bowels a few times weekly - epigastric pain resolved with improved bowel regimen - cont miralax  and biscodyl prn  3. Dry eyes - Artificial Tears ophthalmic solution; Place 1 drop into both eyes 4 (four) times daily as needed (patient may self administer in her room).  Dispense: 15 mL; Refill: 12  4. Weight loss - BMI 20.73 - TSH 3.66 02/25/2024 - weight loss 5 lbs from last encounter - discussed increasing calories in diet - cont Boost - consider recheck TSH if weight loss continues  5. Slow transit constipation - see above  6. Permanent atrial fibrillation (HCC) - HR< 100 with atenolol  - cont Eliquis  for clot prevention - recommend stopping DOAC if falls occur   Total time: 40 minutes. Greater than 50% of total time spent doing patient education regarding health maintenance, epigastric pain, constipation, PAF and weight loss including symptom/medication management.      Next appt: 09/23/2024  Greig Cluster, ELNITA  [facility] [phone]

## 2024-05-27 ENCOUNTER — Other Ambulatory Visit: Payer: Self-pay

## 2024-05-27 DIAGNOSIS — F5101 Primary insomnia: Secondary | ICD-10-CM

## 2024-05-27 NOTE — Telephone Encounter (Signed)
 Patient has request refill on medication Temazepam . Medication pend and sent to covering provider Mast, Man, NP.

## 2024-05-28 MED ORDER — TEMAZEPAM 15 MG PO CAPS: 15.0000 mg | ORAL_CAPSULE | Freq: Every evening | ORAL | 1 refills | Status: DC | PRN

## 2024-06-17 ENCOUNTER — Ambulatory Visit: Payer: Self-pay

## 2024-06-17 NOTE — Telephone Encounter (Signed)
   Contacted [facility] was unable to get in touch with nurse regarding providers response. Also contacted patient and informed her that her provider stated it was okay for her to administer medications herself. Will be faxing both medications with instructions from Provider for self administrating medication.

## 2024-06-17 NOTE — Telephone Encounter (Signed)
 Patient would like for pcp Fargo, Amy E, NP   To confirm with [facility] that patient is able to keep her GasX and Eye drops with her for her own administration due to staff not assisting her when needed.

## 2024-06-17 NOTE — Telephone Encounter (Signed)
 She may self administer Gas-X and eye drops to herself.

## 2024-06-17 NOTE — Telephone Encounter (Signed)
 Nurse attempted to call pt back: no answer and unable to leave message due to no voicemail set up.   Message from [place] B sent at 06/17/2024 12:06 PM EDT  Summary: Kristen, Whitehead (Self) [phone] issue   Reason for Triage: Patient stating she needs for her pcp to call the location where she is currently residing and inform them that she is able to have her gasx when needed. She states that she hasn't slept all night due to the pains in her stomach, she also states that she is having issues with getting her eye drops as well. She normally needs them in late afternoon but she is being given the eyedrops at their convenience. She informed me that she is actually unable to eat like she wants to because she can't get the gasx to keep the stomach issues calm. Please call patient at [phone]

## 2024-06-17 NOTE — Telephone Encounter (Signed)
 FYI Only or Action Required?: Action required by provider: clinical question for provider.  Patient was last seen in primary care on 05/13/2024 by Gil Greig BRAVO, NP.  Called Nurse Triage reporting Medication Problem.  Symptoms began several months ago.  Interventions attempted: pt self advocating for otc medication at bedside  Symptoms are: stable.  Triage Disposition: Home Care  Patient/caregiver understands and will follow disposition?: No, wishes to speak with PCP    Reason for Disposition  [1] MILD-MODERATE pain AND [2] comes and goes (cramps)  Answer Assessment - Initial Assessment Questions Additional info:  1) Patient is upset she cannot take her GasX when she needs, she would like an order from pcp to keep GasX in her room to take as needed. Right now she is needing to ask for medication but with length of time she waits she then gets bloated with abdominal pain which takes hours for her to feel better again. Was up all night with stomach ache.    2) She would also like an order to keep her Eye drops-Artificial Tears in her room. Length of time to wait from asking to administration is too long and by then it is too late and she is needing to deal with pain.   3) 3 months ago moved to [place] and now having issues with her stomach and eye pain due to not taking her medication when needed. She stated staff told her nobody dies from dry eye or gas.   4) patient states she offered to take competency test to prove she is capable of administering her own OTC medicine. She is fine with prescription medication being administered at staff leisure but wants her OTC for her own administration.   5) This writer advised message will be sent to pcp for follow up to her concerns for otc medication at bedside. [place] states the nurse is here today but good luck getting a hold of her, I never can, she is always in such a hurry and I can't even talk with her when she comes to my room  because she has to rush right out    1. LOCATION: Where does it hurt?      Gas pain  2. RADIATION: Does the pain shoot anywhere else? (e.g., chest, back)     no 3. ONSET: When did the pain begin? (e.g., minutes, hours or days ago)      chronic 4. SUDDEN: Gradual or sudden onset?     gradual 5. PATTERN Does the pain come and go, or is it constant?     Intermittent  6. SEVERITY: How bad is the pain?  (e.g., Scale 1-10; mild, moderate, or severe)     Varies-severe when GasX not administered on time.  7. RECURRENT SYMPTOM: Have you ever had this type of stomach pain before? If Yes, ask: When was the last time? and What happened that time?      Yes 8. CAUSE: What do you think is causing the stomach pain? (e.g., gallstones, recent abdominal surgery)     Gas 9. RELIEVING/AGGRAVATING FACTORS: What makes it better or worse? (e.g., antacids, bending or twisting motion, bowel movement)     Gas X  10. OTHER SYMPTOMS: Do you have any other symptoms? (e.g., back pain, diarrhea, fever, urination pain, vomiting)       Denies  Protocols used: Abdominal Pain - Female-A-AH

## 2024-06-18 NOTE — Telephone Encounter (Signed)
 Papers have been faxed to [facility] facility.

## 2024-07-01 ENCOUNTER — Encounter (HOSPITAL_COMMUNITY): Payer: Self-pay

## 2024-07-01 ENCOUNTER — Other Ambulatory Visit: Payer: Self-pay

## 2024-07-01 ENCOUNTER — Emergency Department (HOSPITAL_COMMUNITY)

## 2024-07-01 ENCOUNTER — Emergency Department (HOSPITAL_COMMUNITY)
Admission: EM | Admit: 2024-07-01 | Discharge: 2024-07-01 | Disposition: A | Discharge status: Home / Self Care | Attending: Emergency Medicine | Admitting: Emergency Medicine

## 2024-07-01 ENCOUNTER — Ambulatory Visit: Payer: Self-pay

## 2024-07-01 DIAGNOSIS — M4854XA Collapsed vertebra, not elsewhere classified, thoracic region, initial encounter for fracture: Secondary | ICD-10-CM | POA: Diagnosis not present

## 2024-07-01 DIAGNOSIS — R109 Unspecified abdominal pain: Secondary | ICD-10-CM | POA: Diagnosis present

## 2024-07-01 DIAGNOSIS — R1012 Left upper quadrant pain: Secondary | ICD-10-CM | POA: Diagnosis not present

## 2024-07-01 DIAGNOSIS — I6782 Cerebral ischemia: Secondary | ICD-10-CM | POA: Insufficient documentation

## 2024-07-01 DIAGNOSIS — M47812 Spondylosis without myelopathy or radiculopathy, cervical region: Secondary | ICD-10-CM | POA: Diagnosis not present

## 2024-07-01 DIAGNOSIS — W010XXA Fall on same level from slipping, tripping and stumbling without subsequent striking against object, initial encounter: Secondary | ICD-10-CM | POA: Diagnosis not present

## 2024-07-01 DIAGNOSIS — Z8673 Personal history of transient ischemic attack (TIA), and cerebral infarction without residual deficits: Secondary | ICD-10-CM | POA: Diagnosis not present

## 2024-07-01 DIAGNOSIS — R1013 Epigastric pain: Secondary | ICD-10-CM | POA: Insufficient documentation

## 2024-07-01 DIAGNOSIS — Z79890 Hormone replacement therapy (postmenopausal): Secondary | ICD-10-CM | POA: Insufficient documentation

## 2024-07-01 DIAGNOSIS — Z7901 Long term (current) use of anticoagulants: Secondary | ICD-10-CM | POA: Insufficient documentation

## 2024-07-01 DIAGNOSIS — S0990XA Unspecified injury of head, initial encounter: Secondary | ICD-10-CM | POA: Diagnosis not present

## 2024-07-01 DIAGNOSIS — E039 Hypothyroidism, unspecified: Secondary | ICD-10-CM | POA: Diagnosis not present

## 2024-07-01 DIAGNOSIS — I5032 Chronic diastolic (congestive) heart failure: Secondary | ICD-10-CM | POA: Diagnosis not present

## 2024-07-01 DIAGNOSIS — W19XXXA Unspecified fall, initial encounter: Secondary | ICD-10-CM

## 2024-07-01 LAB — COMPREHENSIVE METABOLIC PANEL WITH GFR
ALT: 16 U/L (ref 0–44)
AST: 30 U/L (ref 15–41)
Albumin: 3.4 g/dL — ABNORMAL LOW (ref 3.5–5.0)
Alkaline Phosphatase: 69 U/L (ref 38–126)
Anion gap: 12 (ref 5–15)
BUN: 16 mg/dL (ref 8–23)
CO2: 24 mmol/L (ref 22–32)
Calcium: 7.8 mg/dL — ABNORMAL LOW (ref 8.9–10.3)
Chloride: 100 mmol/L (ref 98–111)
Creatinine, Ser: 0.75 mg/dL (ref 0.44–1.00)
GFR, Estimated: >60 mL/min (ref >60)
Glucose, Bld: 80 mg/dL (ref 70–99)
Potassium: 3.5 mmol/L (ref 3.5–5.1)
Sodium: 136 mmol/L (ref 135–145)
Total Bilirubin: 1.3 mg/dL — ABNORMAL HIGH (ref 0.0–1.2)
Total Protein: 6.7 g/dL (ref 6.5–8.1)

## 2024-07-01 LAB — CBC
HCT: 40.7 % (ref 36.0–46.0)
Hemoglobin: 13.8 g/dL (ref 12.0–15.0)
MCH: 31.5 pg (ref 26.0–34.0)
MCHC: 33.9 g/dL (ref 30.0–36.0)
MCV: 92.9 fL (ref 80.0–100.0)
Platelets: 202 K/uL (ref 150–400)
RBC: 4.38 MIL/uL (ref 3.87–5.11)
RDW: 14 % (ref 11.5–15.5)
WBC: 5.6 K/uL (ref 4.0–10.5)
nRBC: 0 % (ref 0.0–0.2)

## 2024-07-01 LAB — TROPONIN I (HIGH SENSITIVITY)
Troponin I (High Sensitivity): 8 ng/L (ref <18)
Troponin I (High Sensitivity): 11 ng/L (ref <18)

## 2024-07-01 LAB — LIPASE, BLOOD: Lipase: 34 U/L (ref 11–51)

## 2024-07-01 MED ORDER — SUCRALFATE 1 G PO TABS
1.0000 g | ORAL_TABLET | Freq: Once | ORAL | Status: AC
  Administered 2024-07-01: 1 g via ORAL
  Filled 2024-07-01: qty 1

## 2024-07-01 MED ORDER — IOHEXOL 350 MG/ML SOLN
75.0000 mL | Freq: Once | INTRAVENOUS | Status: AC | PRN
  Administered 2024-07-01: 75 mL via INTRAVENOUS

## 2024-07-01 MED ORDER — SUCRALFATE 1 G PO TABS: 1.0000 g | ORAL_TABLET | Freq: Three times a day (TID) | ORAL | 1 refills | Status: AC

## 2024-07-01 MED ORDER — LACTATED RINGERS IV BOLUS
500.0000 mL | Freq: Once | INTRAVENOUS | Status: AC
  Administered 2024-07-01: 500 mL via INTRAVENOUS

## 2024-07-01 MED ORDER — SUCRALFATE 1 G PO TABS: 1.0000 g | ORAL_TABLET | Freq: Three times a day (TID) | ORAL | 1 refills | Status: DC

## 2024-07-01 NOTE — Discharge Instructions (Signed)
 All the blood work looks normal.  It will be important for you to follow-up with the doctor and try to get the foods out of your diet that could be causing your symptoms.  If not the next step would be following up with a gastroenterologist.  You can try the Carafate in addition to your other medications to see if it helps with your symptoms of abdominal pain.  If you start having fever, vomiting, inability to catch her breath or confusion return to the emergency room.

## 2024-07-01 NOTE — Telephone Encounter (Signed)
 FYI Only or Action Required?: FYI only for provider: ED advised.  Patient was last seen in primary care on 05/13/2024 by Gil Greig BRAVO, NP.  Called Nurse Triage reporting Fall.  Symptoms began yesterday.  Interventions attempted: Nothing.  Symptoms are: stable.  Triage Disposition: Go to ED Now (or PCP Triage)  Patient/caregiver understands and will follow disposition?: Yes Reason for Disposition  Patient sounds very sick or weak to the triager  Answer Assessment - Initial Assessment Questions Patient's son Oneil calling in with patient today. Patient states her stomach started bothering her this week, not eating much. States after her fall she got into her chair after being on the ground and does not remember anything after that. Patient stated she is having trouble breathing which started last night after the fall but its not because her ribs hurt. O2 94%.  1. MECHANISM: How did the fall happen?     Patient is unsure, states feet got tangled up in walker and just fell. States has not been feeling good, pain in stomach  2. ONSET: When did the fall happen? (e.g., minutes, hours, or days ago)     Last night  4. LOCATION: What part of the body hit the ground? (e.g., back, buttocks, head, hips, knees, hands, head, stomach)     Left side. Left hip was bothering patient last night but not today. Bruise on left rib cage  5. INJURY: Did you hurt (injure) yourself when you fell? If Yes, ask: What did you injure? Tell me more about this? (e.g., body area; type of injury; pain severity)     Left ribs  6. PAIN: Is there any pain? If Yes, ask: How bad is the pain? (e.g., Scale 0-10; or none, mild,      States not having any pain right now but left hip and rib cage were bothering her   7. OTHER SYMPTOMS: Do you have any other symptoms? (e.g., dizziness, fever, weakness; new-onset or worsening).      SOB, getting hot  Protocols used: Falls and [facility]  Copied from CRM  [id number]. Topic: Clinical - Red Word Triage >> Jul 01, 2024 10:34 AM Graeme ORN wrote: Red Word that prompted transfer to Nurse Triage: Fall - bruising - shortness of breast concerned about broken rib.

## 2024-07-01 NOTE — ED Provider Notes (Signed)
 [place] EMERGENCY DEPARTMENT AT [facility] Provider Note   CSN: [id number] Arrival date & time: 07/01/24  1156     Patient presents with: Fall (FOT last night) and Shortness of Breath   Kristen Whitehead is a 89 y.o. female.   Patient is a 89 year old female with a history of chronic A-fib on Eliquis , hypothyroidism, chronic diastolic heart failure, prior stroke, and celiac disease who is presenting today after a fall.  Patient reports that she is also having abdominal pain.  Patient had recently moved into assisted living and since that time has had more more abdominal pain.  She is concerned that they are not following a celiac diet and they do use a lot of oil which she also thinks could be causing her abdominal pain.  She reports it is upper abdominal pain that used to be intermittent but now was present pretty much all the time.  She also reports explosive bowel movements sometimes 2-3 times a day that does have some formed stool in it but also is associated with a lot of gas.  Because of this she has not been eating as much.  Also patient reports that last night she was walking into bed and she was getting tired and thinks she tripped because she did not lift her foot up as high.  She remembers hitting the ground mostly on her right side.  She was able to crawl over the chair then get up and go back to bed.  When she woke up this morning she was having soreness and discomfort in her right shoulder and hip.  She was able to walk.  And she continues to have ongoing abdominal pain.  She also complains of some soreness in her left ribs.  She does not think she hit her head and denies any loss of consciousness.  Patient states she has not had any nausea or vomiting.  She has not had recent cough or shortness of breath.  No recent medication changes.  The history is provided by the patient.  Fall Associated symptoms include shortness of breath.  Shortness of Breath      Prior  to Admission medications   Medication Sig Start Date End Date Taking? Authorizing Provider  acetaminophen  (TYLENOL ) 500 MG tablet Take 1,000 mg by mouth 3 (three) times daily as needed for mild pain (pain score 1-3), moderate pain (pain score 4-6), fever or headache.    [provider]  Artificial Tears ophthalmic solution Place 1 drop into both eyes 4 (four) times daily as needed (patient may self administer in her room). 05/13/24 05/13/25  Gil Greig BRAVO, NP  atenolol  (TENORMIN ) 25 MG tablet Take 1 tablet (25 mg total) by mouth every morning AND 2 tablets (50 mg total) every evening. 09/18/23   Fargo, Amy E, NP  bisacodyl  (DULCOLAX) 5 MG EC tablet Take 5 mg by mouth daily as needed for moderate constipation.    [provider]  busPIRone  (BUSPAR ) 5 MG tablet TAKE 1 TABLET(5 MG) BY MOUTH TWICE DAILY 01/02/24   Fargo, Amy E, NP  dorzolamide  (TRUSOPT ) 2 % ophthalmic solution Place 1 drop into the left eye 2 (two) times daily. 09/24/19   [provider]  ELIQUIS  2.5 MG TABS tablet TAKE 1 TABLET(2.5 MG) BY MOUTH TWICE DAILY 09/15/23   O'Neal, Darryle Ned, MD  latanoprost  (XALATAN ) 0.005 % ophthalmic solution Place 1 drop into both eyes at bedtime.  02/23/13   [provider]  levothyroxine  (SYNTHROID ) 88  MCG tablet Take 1 tablet (88 mcg total) by mouth daily. 09/19/23   Fargo, Amy E, NP  omeprazole  (PRILOSEC) 20 MG capsule Take 20 mg by mouth 2 (two) times daily before a meal.    [provider]  polyethylene glycol (MIRALAX ) 17 g packet Take 17 g by mouth daily as needed. Give 07/30 & 07/31 due to constipation, then daily prn 03/16/24   Gil No E, NP  PREMARIN vaginal cream Place 2 g vaginally 2 (two) times a week. Uses on Sunday and wednesday 03/25/18   [provider]  simethicone  (MYLICON) 80 MG chewable tablet Chew 1 tablet (80 mg total) by mouth 2 (two) times daily as needed for flatulence. Ok to self administer 03/11/24   Gil, Amy E, NP  sucralfate  (CARAFATE) 1 g tablet Take 1 tablet (1 g total) by mouth 4 (four) times daily -  with meals and at bedtime. 07/01/24   Doretha Folks, MD  temazepam  (RESTORIL ) 15 MG capsule Take 1 capsule (15 mg total) by mouth at bedtime as needed for sleep. 05/28/24   Mast, Man X, NP  White Petrolatum-Mineral Oil (GENTEAL TEARS NIGHT-TIME) OINT Apply 1 strip to eye at bedtime. Patient may self administer at bedtime Both Eyes 04/30/24   Gil No E, NP    Allergies: Azithromycin, Compazine [prochlorperazine edisylate], Demerol, Dolophine [methadone], Ebastine, Erythromycin, Gluten meal, Ibuprofen, Meperidine hcl, Morphine  sulfate, Other, Statins, Tetanus toxoid-containing vaccines, and Tetracyclines & related    Review of Systems  Respiratory:  Positive for shortness of breath.     Updated Vital Signs BP (!) 186/90   Pulse 64   Temp 98 F (36.7 C) (Oral)   Resp 19   Ht 4' 11 (1.499 m)   Wt 45.8 kg   SpO2 98%   BMI 20.40 kg/m   Physical Exam Vitals and nursing note reviewed.  Constitutional:      General: She is in acute distress.     Appearance: She is well-developed.  HENT:     Head: Normocephalic and atraumatic.  Eyes:     Pupils: Pupils are equal, round, and reactive to light.  Cardiovascular:     Rate and Rhythm: Normal rate and regular rhythm.     Heart sounds: Normal heart sounds. No murmur heard.    No friction rub.  Pulmonary:     Effort: Pulmonary effort is normal.     Breath sounds: Normal breath sounds. No wheezing or rales.  Abdominal:     General: Bowel sounds are normal. There is no distension.     Palpations: Abdomen is soft.     Tenderness: There is abdominal tenderness in the epigastric area and left upper quadrant. There is no guarding or rebound.   Musculoskeletal:        General: No tenderness. Normal range of motion.     Cervical back: Normal range of motion and neck supple.     Right lower leg: No edema.     Left lower leg: No edema.     Comments: No edema   Skin:    General: Skin is warm and dry.     Findings: No rash.  Neurological:     Mental Status: She is alert and oriented to person, place, and time.     Cranial Nerves: No cranial nerve deficit.  Psychiatric:        Behavior: Behavior normal.     (all labs ordered are listed, but only abnormal results are displayed) Labs Reviewed  COMPREHENSIVE METABOLIC PANEL WITH GFR - Abnormal; Notable for the following components:      Result Value   Calcium  7.8 (*)    Albumin 3.4 (*)    Total Bilirubin 1.3 (*)    All other components within normal limits  CBC  LIPASE, BLOOD  TROPONIN I (HIGH SENSITIVITY)  TROPONIN I (HIGH SENSITIVITY)    EKG: EKG Interpretation Date/Time:  Thursday July 01 2024 12:27:39 EST Ventricular Rate:  70 PR Interval:    QRS Duration:  54 QT Interval:  400 QTC Calculation: 432 R Axis:   -53  Text Interpretation: Atrial fibrillation Left axis deviation Anterior infarct , age undetermined ST & T wave abnormality, consider lateral ischemia Artifact When compared with ECG of 12-Mar-2023 20:13, PREVIOUS ECG IS PRESENT Confirmed by Doretha Folks ([id number]) on 07/01/2024 4:42:22 PM  Radiology: CT Head Wo Contrast Result Date: 07/01/2024 CLINICAL DATA:  Worsening headache.  Fall x2 at home this week. EXAM: CT HEAD WITHOUT CONTRAST CT CERVICAL SPINE WITHOUT CONTRAST TECHNIQUE: Multidetector CT imaging of the head and cervical spine was performed following the standard protocol without intravenous contrast. Multiplanar CT image reconstructions of the cervical spine were also generated. RADIATION DOSE REDUCTION: This exam was performed according to the departmental dose-optimization program which includes automated exposure control, adjustment of the mA and/or kV according to patient size and/or use of iterative reconstruction technique. COMPARISON:  Head CT 03/12/2023 and cervical spine CT 06/11/2017, MRI cervical spine 09/10/2022 FINDINGS: CT HEAD FINDINGS Brain:  Ventricles, cisterns and other CSF spaces are normal. There is chronic ischemic microvascular disease present. There is no mass, mass effect, shift of midline structures or acute hemorrhage. No evidence of acute infarction. Vascular: No hyperdense vessel or unexpected calcification. Skull: Normal. Negative for fracture or focal lesion. Sinuses/Orbits: No acute finding. Other: None. CT CERVICAL SPINE FINDINGS Alignment: Normal. Skull base and vertebrae: Vertebral body heights over the cervical spine are normal. There is mild spondylosis of the cervical spine to include uncovertebral joint spurring and facet arthropathy. Minimal right-sided neural foraminal narrowing at the C3-4 level. No acute fracture. Mild loss of anterior vertebral body height of T1 unchanged. Soft tissues and spinal canal: Prevertebral soft tissues are normal. No evidence of canal stenosis. Disc levels:  Minimal disc space narrowing at the C5-6 level. Upper chest: No acute findings. Other: None. IMPRESSION: 1. No acute brain injury. 2. Chronic ischemic microvascular disease. 3. No acute cervical spine injury. 4. Mild spondylosis of the cervical spine with minimal disc disease at the C5-6 level. Minimal right-sided neural foraminal narrowing at the C3-4 level. 5. Stable chronic mild anterior wedging of T1. Electronically Signed   By: Toribio Agreste M.D.   On: 07/01/2024 15:48   CT Cervical Spine Wo Contrast Result Date: 07/01/2024 CLINICAL DATA:  Worsening headache.  Fall x2 at home this week. EXAM: CT HEAD WITHOUT CONTRAST CT CERVICAL SPINE WITHOUT CONTRAST TECHNIQUE: Multidetector CT imaging of the head and cervical spine was performed following the standard protocol without intravenous contrast. Multiplanar CT image reconstructions of the cervical spine were also generated. RADIATION DOSE REDUCTION: This exam was performed according to the departmental dose-optimization program which includes automated exposure control, adjustment of the mA  and/or kV according to patient size and/or use of iterative reconstruction technique. COMPARISON:  Head CT 03/12/2023 and cervical spine CT 06/11/2017, MRI cervical spine 09/10/2022 FINDINGS: CT HEAD FINDINGS Brain: Ventricles, cisterns and other CSF spaces are normal. There is chronic ischemic microvascular disease present. There is no mass, mass  effect, shift of midline structures or acute hemorrhage. No evidence of acute infarction. Vascular: No hyperdense vessel or unexpected calcification. Skull: Normal. Negative for fracture or focal lesion. Sinuses/Orbits: No acute finding. Other: None. CT CERVICAL SPINE FINDINGS Alignment: Normal. Skull base and vertebrae: Vertebral body heights over the cervical spine are normal. There is mild spondylosis of the cervical spine to include uncovertebral joint spurring and facet arthropathy. Minimal right-sided neural foraminal narrowing at the C3-4 level. No acute fracture. Mild loss of anterior vertebral body height of T1 unchanged. Soft tissues and spinal canal: Prevertebral soft tissues are normal. No evidence of canal stenosis. Disc levels:  Minimal disc space narrowing at the C5-6 level. Upper chest: No acute findings. Other: None. IMPRESSION: 1. No acute brain injury. 2. Chronic ischemic microvascular disease. 3. No acute cervical spine injury. 4. Mild spondylosis of the cervical spine with minimal disc disease at the C5-6 level. Minimal right-sided neural foraminal narrowing at the C3-4 level. 5. Stable chronic mild anterior wedging of T1. Electronically Signed   By: Toribio Agreste M.D.   On: 07/01/2024 15:48   CT ABDOMEN PELVIS W CONTRAST Result Date: 07/01/2024 EXAM: CT ABDOMEN AND PELVIS WITH CONTRAST 07/01/2024 03:10:13 PM TECHNIQUE: CT of the abdomen and pelvis was performed with the administration of intravenous contrast. Multiplanar reformatted images are provided for review. Automated exposure control, iterative reconstruction, and/or weight-based adjustment  of the mA/kV was utilized to reduce the radiation dose to as low as reasonably achievable. COMPARISON: None available. CLINICAL HISTORY: Abdominal pain, acute, nonlocalized. Additional clinical data fall at home. FINDINGS: LOWER CHEST: No pleural fluid or pneumothorax. LIVER: The liver is unremarkable. GALLBLADDER AND BILE DUCTS: Post cholecystectomy. The common bile duct is mildly dilated, typical of post cholecystectomy physiology. SPLEEN: No acute abnormality. PANCREAS: No acute abnormality. ADRENAL GLANDS: No acute abnormality. KIDNEYS, URETERS AND BLADDER: Bilateral extrarenal pelvises. No stones in the kidneys or ureters. No hydronephrosis or obstruction. No perinephric or periureteral stranding. Urinary bladder is unremarkable. GI AND BOWEL: Stomach demonstrates no acute abnormality. Appendix normal. No bowel inflammation or obstruction. PERITONEUM AND RETROPERITONEUM: No ascites. No free air. VASCULATURE: Aorta is normal in caliber. LYMPH NODES: No lymphadenopathy. REPRODUCTIVE ORGANS: No acute abnormality. BONES AND SOFT TISSUES: No acute spine fracture or pelvic fracture. Remote left ischemic fracture. No focal soft tissue abnormality. IMPRESSION: 1. No acute findings in the abdomen or pelvis. Electronically signed by: Norleen Boxer MD 07/01/2024 03:40 PM EST RP Workstation: [id number]   DG Chest 1 View Result Date: 07/01/2024 CLINICAL DATA:  Shortness of breath. EXAM: CHEST  1 VIEW COMPARISON:  Chest radiograph dated 03/11/2024. FINDINGS: No focal consolidation, pleural effusion or pneumothorax. Mild cardiomegaly. Atherosclerotic calcification of the aorta. No acute osseous pathology. IMPRESSION: 1. No active disease. 2. Mild cardiomegaly. Electronically Signed   By: Vanetta Chou M.D.   On: 07/01/2024 13:43     Procedures   Medications Ordered in the ED  iohexol  (OMNIPAQUE ) 350 MG/ML injection 75 mL (75 mLs Intravenous Contrast Given 07/01/24 1510)  lactated ringers  bolus 500 mL (0 mLs  Intravenous Stopped 07/01/24 1812)  sucralfate (CARAFATE) tablet 1 g (1 g Oral Given 07/01/24 1721)                                    Medical Decision Making Amount and/or Complexity of Data Reviewed External Data Reviewed: notes. Labs: ordered. Decision-making details documented in ED Course. Radiology: ordered and independent  interpretation performed. Decision-making details documented in ED Course.  Risk Prescription drug management.   Pt with multiple medical problems and comorbidities and presenting today with a complaint that caries a high risk for morbidity and mortality.   Here today with the above complaints.  Patient is able to range all extremities and is able to ambulate and low suspicion for acute fracture.  Patient has no external signs of head trauma at this time is awake and alert and oriented.  Patient does have abdominal pain and a long history of intermittent abdominal pain but has not worsened since she recently moved to assisted living.  She is not having any bloody stools.  Otherwise she denies any shortness of breath even though she reported earlier she denies it at this time.  She has not had any cough or congestion.  No fevers and lower suspicion for pneumonia, ACS.  Will rule out bowel obstruction, hepatitis, pancreatitis.  Low suspicion for renal stone.  Also concern for possible rib fracture.  I independently interpreted patient's labs and EKG.  Troponin x 2 was negative, CBC, CMP and lipase without acute findings.  EKG without acute findings.  I have independently visualized and interpreted pt's images today.  Head CT and cervical spine without evidence of acute fracture.  Radiology reports no acute issues but some microvascular changes and degenerative changes in the neck, CT abdomen pelvis without acute findings today.  Chest x-ray without rib fractures or acute findings.  All this was discussed with the patient and her son.  She was given some IV fluids due to  decreased intake this past week.  Also given Carafate but did discuss it might be dietary causes in the new living situation.  Also possibility for pancreatic insufficiency.  Encouraged follow-up with PCP and possibly GI.  At this time no indication for admission.  Vital signs have been stable.  Patient is well-appearing.  She and her son are comfortable with this plan.       Final diagnoses:  Fall, initial encounter  Epigastric abdominal pain    ED Discharge Orders          Ordered    sucralfate (CARAFATE) 1 g tablet  3 times daily with meals & bedtime,   Status:  Discontinued        07/01/24 1836    sucralfate (CARAFATE) 1 g tablet  3 times daily with meals & bedtime        07/01/24 1914               Doretha Folks, MD 07/01/24 2203

## 2024-07-01 NOTE — ED Triage Notes (Signed)
 PT arrived POV d/t fall pt states she thinks she tripped over something (per pt)pt was feeling really tired and hasn't been feeling well this week c/o SOB, explosive diarrhea, head feeling like its full of air.PT is on blood thinner doesn't know which ( per pt they don't tell you what you take) Hx of stroke and falls Pt rates pain 6/10 on her rib d/t fall.

## 2024-07-01 NOTE — ED Provider Triage Note (Signed)
 Emergency Medicine Provider Triage Evaluation Note  SUELLA COGAR , a 89 y.o. female  was evaluated in triage.  Pt complains of abdominal pain, shortness of breath, and fall at home x 2 this week.  Review of Systems  Positive: See above Negative: Headache  Physical Exam  There were no vitals taken for this visit. Gen:   Awake, no distress   Resp:  Normal effort  MSK:   Moves extremities without difficulty  Other:  No obvious head injury, no cervical spinal tenderness  Medical Decision Making  Medically screening exam initiated at 12:13 PM.  Appropriate orders placed.  ANNALIESA BLANN was informed that the remainder of the evaluation will be completed by another provider, this initial triage assessment does not replace that evaluation, and the importance of remaining in the ED until their evaluation is complete.  Patient does report a fall last night as well as earlier this week but does not know why she fell.  Denies any known head trauma but is unsure if she hit her head last night.  She does not have any outward signs of head trauma and denies any headache so we will hold off on trauma alert at this time in light of this.  Appropriate orders are placed to further work her up in regards to her shortness of breath and abdominal discomfort.  CT scan is ordered to evaluate for possibility of head injury that the patient is unaware of but again at this time she does not have any symptoms to suggest that and denies any known head trauma.   Ula Prentice SAUNDERS, MD 07/01/24 1215

## 2024-07-07 ENCOUNTER — Other Ambulatory Visit: Payer: Self-pay | Admitting: Orthopedic Surgery

## 2024-07-07 ENCOUNTER — Telehealth: Payer: Self-pay

## 2024-07-07 ENCOUNTER — Ambulatory Visit: Payer: Self-pay

## 2024-07-07 DIAGNOSIS — R058 Other specified cough: Secondary | ICD-10-CM

## 2024-07-07 MED ORDER — COUGH DROPS MT LOZG
1.0000 | LOZENGE | OROMUCOSAL | Status: AC | PRN
Start: 2024-07-07 — End: ?

## 2024-07-07 NOTE — Telephone Encounter (Signed)
 Copied from CRM [id number]. Topic: Clinical - Medication Question >> Jul 07, 2024 10:11 AM Chiquita SQUIBB wrote: Reason for CRM: Patient is requesting the doctor send a note to her facility that she can have cough drops when she needs them as she has a cough that is constant and wants to be able to have her cough drops. Patient would also like to let the doctor know she has been very hot lately, which is different for her because she is usually cold due to her thyroid  which usually has her medication adjusted.

## 2024-07-07 NOTE — Telephone Encounter (Signed)
Patient has no questions at this time.

## 2024-07-07 NOTE — Telephone Encounter (Signed)
 See Notes From Triage Notes from Triage Nurse

## 2024-07-07 NOTE — Telephone Encounter (Signed)
 FYI Only or Action Required?: Action required by provider: Requesting PCP to call assisted living facility.  Patient was last seen in primary care on 05/13/2024 by Gil Greig BRAVO, NP.  Called Nurse Triage reporting Cough.  Symptoms began several months ago.  Interventions attempted: OTC medications: cough drops.  Symptoms are: stable.  Triage Disposition: Information or Advice Only Call  Patient/caregiver understands and will follow disposition?: Yes Reason for Disposition  Health information question, no triage required and triager able to answer question  Answer Assessment - Initial Assessment Questions Patient needs PCP to call the nurse at the assisted living facility to have cough drops at her disposal. Patient is unsure of phone number, states PCP should know it. She is also having hot flashes sometimes and is usually cold due to thyroid  being taken out and thinks her thyroid  medication needs to be adjusted. She also wants to know if PCP wants her to continue taking Doculax everyday, she states she has blow outs when she takes it but then constipated if not taking it.  Please advise.   1. REASON FOR CALL: What is the main reason for your call? or How can I best help you?     Patient requesting for cough drops to be given to her at assisted living.   2. SYMPTOMS : Do you have any symptoms?      Coughing all the time, dry eyes  Protocols used: Information Only Call - No Triage-A-AH  Copied from CRM [id number]. Topic: Clinical - Red Word Triage >> Jul 07, 2024 10:10 AM Chiquita SQUIBB wrote: Red Word that prompted transfer to Nurse Triage: Patient is calling in stating that her eyes are still painful and the eye drops havent been a whole lot.

## 2024-07-07 NOTE — Telephone Encounter (Signed)
 Thyroid  level was normal back in July. If you are taking levothyroxine  as prescribed and on empty stomach the first hour daily, I do not see need to recheck at this time. We can fax order to have cough drops as needed. Please do not take with increased sleepiness. Cough drop orders has been placed.

## 2024-07-08 ENCOUNTER — Encounter: Payer: Self-pay | Admitting: Oncology

## 2024-07-12 ENCOUNTER — Ambulatory Visit: Payer: Self-pay

## 2024-07-12 NOTE — Telephone Encounter (Signed)
 FYI Only or Action Required?: FYI only for provider: appointment scheduled on 11/26.  Patient was last seen in primary care on 05/13/2024 by Gil Greig BRAVO, NP.  Called Nurse Triage reporting Thyroid  Problem.  Symptoms began several weeks ago.  Interventions attempted: Prescription medications: Synthroid .  Symptoms are: gradually worsening.  Triage Disposition: See PCP When Office is Open (Within 3 Days)  Patient/caregiver understands and will follow disposition?: Yes  Copied from CRM #[id number]. Topic: Clinical - Medical Advice >> Jul 12, 2024  1:59 PM Debby BROCKS wrote: Reason for CRM: Patient has not been feeling well and for the past 2 weeks she's been hot in terms of body temperature. She would like to know what she can do to help soothen it whether if an appointment is needed or if she can just be provided medical advice for steps. She doesn't know if its due to thyroid  or not. Reason for Disposition  Nursing judgment or information in reference  Answer Assessment - Initial Assessment Questions Patient had Throid removed at 89yo. When her body temperature regulation is affected she normally needs her Synthroid  adjusted. Last labs drawn in July were normal after increase from 75mcg to  in January. She denies fever, CP, SOB, or dizziness. She denies UTI sx. Has celiac and was seen in ED on 11/14 for abdominal pain. That has lessened but still having explosive stools.  Appt with PCP office 11/26 to discuss. ED/UC precautions advised.   1. REASON FOR CALL: What is your main concern right now?     Increased body temperature 2. ONSET: When did the body temp start?     2 weeks 3. SEVERITY: How bad is the body temp?     Normally very cold natured- wearing summer cloths- keeps room at 78 normally now at 70 4. FUNCTIONAL IMPAIRMENT: How have things been going for you overall? Have you had more difficulty than usual doing your normal daily activities? (e.g., self-care, school, work,  interactions)     Gets overheated and needs fresh air. Hard to do at night when in assisted living 5. RELIEVING AND AGGRAVATING FACTORS: What makes it better or worse? (e.g., certain activities, rest)     Fresh air 6. FEVER: Do you have a fever?     Denies  7. OTHER SYMPTOMS: Do you have any other new symptoms?     Abdominal discomfort- seen in ED 11/14 8. TREATMENTS AND RESPONSE: What have you done so far to try to make this better? What medicines have you used?     Lighter clothes  Protocols used: No Guideline Available-A-AH

## 2024-07-14 ENCOUNTER — Encounter: Admitting: Family

## 2024-07-16 ENCOUNTER — Encounter: Payer: Self-pay | Admitting: Orthopedic Surgery

## 2024-07-18 NOTE — Progress Notes (Signed)
   This encounter was created in error - please disregard. No show

## 2024-07-19 ENCOUNTER — Ambulatory Visit: Payer: Self-pay

## 2024-07-19 NOTE — Telephone Encounter (Signed)
 FYI

## 2024-07-19 NOTE — Telephone Encounter (Signed)
 With pt permission, this RN contacted 911 and EMS was dispatched.   FYI Only or Action Required?: FYI only for provider: ED advised.  Patient was last seen in primary care on 05/13/2024 by Gil Greig BRAVO, NP.  Called Nurse Triage reporting Dizziness and Fatigue.  Symptoms began several weeks ago.  Interventions attempted: Rest, hydration, or home remedies.  Symptoms are: gradually worsening.  Triage Disposition: Go to ED Now (or PCP Triage)  Patient/caregiver understands and will follow disposition?: Yes Reason for Disposition  Patient sounds very sick or weak to the triager  Answer Assessment - Initial Assessment Questions 3 week hx of fatigue and lightheadedness and inability to get up to leave her room. Also reports right eye discomfort and difficulty reading d/t calcium  build-up and macular degeneration but is painful the past 3 weeks. States she can see something on the eye and that it is weeping and that something was sticking up out of it. States left arm is sore and she has lost 2 lbs.   States is normally very active and is happy at [facility] so she is concerned that she is being less interactive.   Reports that she took her vitals herself 07/15/24 and BP was 194/129 and pulse was 105. Denies headache. States staff does not check her BP as far as she knows.  Patient requested that her daughter be called at [phone]. Pt's daughter states that she is ok with her going to the ED via EMS if pt agrees. This RN provided rationales for ED evaluation to pt and her daughter.   1. SYMPTOM: What is the main symptom you are concerned about? (e.g., weakness, numbness)     Feels lightheaded when standing, described as an un sureness if I'll be able to stand. Also reports worsening of vision and feeling hot. Is concerned about TIA or hypothyroidism.  2. ONSET: When did this start? (e.g., minutes, hours, days; while sleeping)     3 weeks  3. LAST NORMAL: When  was the last time you (the patient) were normal (no symptoms)?     3 weeks ago  4. PATTERN Does this come and go, or has it been constant since it started?  Is it present now?     Constant  5. CARDIAC SYMPTOMS: Have you had any of the following symptoms: chest pain, difficulty breathing, palpitations?     Denies  6. NEUROLOGIC SYMPTOMS: Have you had any of the following symptoms: headache, dizziness, vision loss, double vision, changes in speech, unsteady on your feet?     See above  7. OTHER SYMPTOMS: Do you have any other symptoms?     See above  Protocols used: [facility] Copied from CRM #[id number]. Topic: Clinical - Red Word Triage >> Jul 19, 2024  1:52 PM Dominique A wrote: [facility] that prompted transfer to Nurse Triage: Patient stated for the past three weeks she has not been feeling right, she has been real tired and have not felt the same. Patient has been feeling woozy, she has been thinking she would get better but has not. Patient has a film over her right eye and cannot see and she is seeing something white in her eye.  Past Medical History:  Diagnosis Date   Acute on chronic diastolic CHF (congestive heart failure) (HCC) 02/01/2014   Bladder prolapse, female, acquired    Cancer (HCC)    thyroid    Chest pain    no known ischemic heart disease; negative Myoview  July 2013.  EF 75% with no ischemia.    Chronic anticoagulation    Chronic atrial fibrillation (HCC)    managed with rate control and coumadin    Chronic diastolic heart failure (HCC)    GERD (gastroesophageal reflux disease)    Heart disease    History of congenital mitral regurgitation    mild   History of heart attack    Multiple    History of mitral valve prolapse 06/15/2008   a. echo 1/14: mild LVH, EF 60-65%, mild MR, mild to mod BAE, PASP 35   Hypercholesterolemia    Hypertension    Hypothyroidism    Stroke (HCC)

## 2024-07-20 ENCOUNTER — Ambulatory Visit: Payer: Self-pay

## 2024-07-20 NOTE — Telephone Encounter (Signed)
 Patient/caller refused triage. This RN called [facility] to notify and will send high priority to clinic.     Copied from CRM #[id number]. Topic: Clinical - Red Word Triage >> Jul 20, 2024  3:05 PM DeAngela L wrote: Red Word that prompted transfer to Nurse Triage: patient stated she was not feeling like herself and she not sure if she is going to have a stroke or not but she is not having any pain and just saying she is moving slower than normal but listening to her and wanted to note she is discussing not doing much lately and not going down for lunch or leaving her room a lot but also states her vision is not the best so this could be a concern why also   But she decline NT call cause she says they will tell her to go to hospital and she is fine  Pt num [phone]

## 2024-07-29 ENCOUNTER — Ambulatory Visit: Admitting: Orthopedic Surgery

## 2024-07-29 ENCOUNTER — Encounter: Payer: Self-pay | Admitting: Orthopedic Surgery

## 2024-07-29 VITALS — BP 130/80 | HR 57 | Temp 98.3°F | Ht 59.0 in | Wt 101.2 lb

## 2024-07-29 DIAGNOSIS — Q233 Congenital mitral insufficiency: Secondary | ICD-10-CM

## 2024-07-29 DIAGNOSIS — I5032 Chronic diastolic (congestive) heart failure: Secondary | ICD-10-CM | POA: Diagnosis not present

## 2024-07-29 DIAGNOSIS — E890 Postprocedural hypothyroidism: Secondary | ICD-10-CM | POA: Diagnosis not present

## 2024-07-29 DIAGNOSIS — R634 Abnormal weight loss: Secondary | ICD-10-CM | POA: Diagnosis not present

## 2024-07-29 DIAGNOSIS — F419 Anxiety disorder, unspecified: Secondary | ICD-10-CM

## 2024-07-29 DIAGNOSIS — R5381 Other malaise: Secondary | ICD-10-CM | POA: Diagnosis not present

## 2024-07-29 DIAGNOSIS — Z7189 Other specified counseling: Secondary | ICD-10-CM | POA: Diagnosis not present

## 2024-07-29 DIAGNOSIS — I4821 Permanent atrial fibrillation: Secondary | ICD-10-CM

## 2024-07-29 NOTE — Patient Instructions (Signed)
 Recommend humidifier to help with dry eyes

## 2024-07-29 NOTE — Progress Notes (Unsigned)
 Kristen Whitehead

## 2024-07-30 ENCOUNTER — Ambulatory Visit: Payer: Self-pay | Admitting: Orthopedic Surgery

## 2024-07-30 LAB — TSH: TSH: 4.56 m[IU]/L — ABNORMAL HIGH (ref 0.40–4.50)

## 2024-07-30 NOTE — Progress Notes (Signed)
 Careteam: Patient Care Team: Gil Greig BRAVO, NP as PCP - General (Adult Health Nurse Practitioner) O'Neal, Darryle Ned, MD as PCP - Cardiology (Cardiology) O'Neal, Darryle Ned, MD as Consulting Physician (Cardiology) Fleeta Zerita DASEN, MD as Consulting Physician (Ophthalmology) Tobie Baptist, MD as Consulting Physician (Ophthalmology)  Seen by: Greig Gil, AGNP-C  PLACE OF SERVICE:  [facility]  Advanced Directive information    Allergies[1]  Chief Complaint  Patient presents with   Acute Visit     HPI: Patient is a 89 y.o. female seen today for acute visit due to weakness.   Discussed the use of AI scribe software for clinical note transcription with the patient, who gave verbal consent to proceed.  History of Present Illness   Kristen Whitehead is a 89 year old female who presents with persistent heat intolerance and eye discomfort.  She experiences persistent heat intolerance, feeling excessively warm despite cooler surroundings. Her thyroid  function was checked in July and was normal at 3.66. She takes levothyroxine  before breakfast, but there is confusion about whether it is taken on an empty stomach as recommended since it is given by AL nurse.   She has a history of falls, with a recent incident on November 13th leading to an ED visit. No fracture or injury. Lab work was unremarkable.   She has experienced weight loss, dropping from 123 pounds to 99 pounds over the past 18 months. She attributes some of this to increased activity since moving to assisted living, where she walks more frequently. Her clothes are now too large for her.  She has a history of atrial fibrillation and congestive heart failure, which are chronic conditions she manages.     Goals of care discussed with patient and son. She was enrolled in hospice in the past due to decline in physical condition and weight loss. She would like to enroll in hospice again.   Review of Systems:  Review of  Systems  Constitutional:  Positive for malaise/fatigue and weight loss.  HENT: Negative.    Eyes:  Positive for blurred vision and pain.  Respiratory: Negative.    Cardiovascular: Negative.   Gastrointestinal: Negative.   Genitourinary: Negative.   Musculoskeletal:  Positive for falls. Negative for joint pain.  Skin: Negative.   Neurological:  Positive for weakness. Negative for dizziness and headaches.  Endo/Heme/Allergies: Negative.        Hot flashes  Psychiatric/Behavioral:  Negative for depression and memory loss. The patient is not nervous/anxious.     Past Medical History:  Diagnosis Date   Acute on chronic diastolic CHF (congestive heart failure) (HCC) 02/01/2014   Bladder prolapse, female, acquired    Cancer (HCC)    thyroid    Chest pain    no known ischemic heart disease; negative Myoview  July 2013. EF 75% with no ischemia.    Chronic anticoagulation    Chronic atrial fibrillation (HCC)    managed with rate control and coumadin    Chronic diastolic heart failure (HCC)    GERD (gastroesophageal reflux disease)    Heart disease    History of congenital mitral regurgitation    mild   History of heart attack    Multiple    History of mitral valve prolapse 06/15/2008   a. echo 1/14: mild LVH, EF 60-65%, mild MR, mild to mod BAE, PASP 35   Hypercholesterolemia    Hypertension    Hypothyroidism    Stroke [facility])    Past Surgical History:  Procedure Laterality Date  ABDOMINAL HYSTERECTOMY     CATARACT EXTRACTION     CHOLECYSTECTOMY     Thyroidectomy     TONSILLECTOMY     Social History:   reports that she has never smoked. She has never used smokeless tobacco. She reports that she does not drink alcohol  and does not use drugs.  Family History  Problem Relation Age of Onset   Stroke Mother 32   Stroke Father 17   Arthritis Daughter    Gout Daughter    Immunocompromised Granddaughter    Heart attack Neg Hx    Heart disease Neg Hx      Medications: Patient's Medications  New Prescriptions   No medications on file  Previous Medications   ACETAMINOPHEN  (TYLENOL ) 500 MG TABLET    Take 1,000 mg by mouth 3 (three) times daily as needed for mild pain (pain score 1-3), moderate pain (pain score 4-6), fever or headache.   ARTIFICIAL TEARS OPHTHALMIC SOLUTION    Place 1 drop into both eyes 4 (four) times daily as needed (patient may self administer in her room).   ATENOLOL  (TENORMIN ) 25 MG TABLET    Take 1 tablet (25 mg total) by mouth every morning AND 2 tablets (50 mg total) every evening.   BISACODYL  (DULCOLAX) 5 MG EC TABLET    Take 5 mg by mouth daily as needed for moderate constipation.   BUSPIRONE  (BUSPAR ) 5 MG TABLET    TAKE 1 TABLET(5 MG) BY MOUTH TWICE DAILY   DORZOLAMIDE  (TRUSOPT ) 2 % OPHTHALMIC SOLUTION    Place 1 drop into the left eye 2 (two) times daily.   ELIQUIS  2.5 MG TABS TABLET    TAKE 1 TABLET(2.5 MG) BY MOUTH TWICE DAILY   LATANOPROST  (XALATAN ) 0.005 % OPHTHALMIC SOLUTION    Place 1 drop into both eyes at bedtime.    LEVOTHYROXINE  (SYNTHROID ) 88 MCG TABLET    Take 1 tablet (88 mcg total) by mouth daily.   OMEPRAZOLE  (PRILOSEC) 20 MG CAPSULE    Take 20 mg by mouth 2 (two) times daily before a meal.   POLYETHYLENE GLYCOL (MIRALAX ) 17 G PACKET    Take 17 g by mouth daily as needed. Give 07/30 & 07/31 due to constipation, then daily prn   PREMARIN VAGINAL CREAM    Place 2 g vaginally 2 (two) times a week. Uses on Sunday and wednesday   SIMETHICONE  (MYLICON) 80 MG CHEWABLE TABLET    Chew 1 tablet (80 mg total) by mouth 2 (two) times daily as needed for flatulence. Ok to self administer   SUCRALFATE  (CARAFATE ) 1 G TABLET    Take 1 tablet (1 g total) by mouth 4 (four) times daily -  with meals and at bedtime.   TEMAZEPAM  (RESTORIL ) 15 MG CAPSULE    Take 1 capsule (15 mg total) by mouth at bedtime as needed for sleep.   THROAT LOZENGES (COUGH DROPS) LOZG    Use as directed 1 lozenge in the mouth or throat as needed.    WHITE PETROLATUM-MINERAL OIL (GENTEAL TEARS NIGHT-TIME) OINT    Apply 1 strip to eye at bedtime. Patient may self administer at bedtime Both Eyes  Modified Medications   No medications on file  Discontinued Medications   No medications on file    Physical Exam:  Vitals:   07/29/24 1528  BP: 130/80  Pulse: (!) 57  Temp: 98.3 F (36.8 C)  SpO2: 95%  Weight: 101 lb 3.2 oz (45.9 kg)  Height: 4' 11 (1.499 m)  Body mass index is 20.44 kg/m. Wt Readings from Last 3 Encounters:  07/29/24 101 lb 3.2 oz (45.9 kg)  07/01/24 101 lb (45.8 kg)  05/13/24 99 lb 3.2 oz (45 kg)    Physical Exam Vitals reviewed.  Constitutional:      General: She is not in acute distress. HENT:     Head: Normocephalic.  Eyes:     General:        Right eye: No discharge.        Left eye: No discharge.  Cardiovascular:     Rate and Rhythm: Normal rate. Rhythm irregular.     Pulses: Normal pulses.     Heart sounds: Murmur heard.  Pulmonary:     Effort: Pulmonary effort is normal.     Breath sounds: Normal breath sounds.  Abdominal:     Palpations: Abdomen is soft.  Musculoskeletal:     Cervical back: Neck supple.     Right lower leg: No edema.     Left lower leg: No edema.  Skin:    General: Skin is warm.     Capillary Refill: Capillary refill takes less than 2 seconds.  Neurological:     General: No focal deficit present.     Mental Status: She is alert and oriented to person, place, and time.  Psychiatric:        Mood and Affect: Mood normal.     Labs reviewed: Basic Metabolic Panel: Recent Labs    09/18/23 1404 02/25/24 1514 03/11/24 1349 07/01/24 1219 07/29/24 1627  NA 138 137 138 136  --   K 4.4 4.3 4.3 3.5  --   CL 98 100 99 100  --   CO2 33* 28 33* 24  --   GLUCOSE 97 95 102* 80  --   BUN 18 24 20 16   --   CREATININE 0.92 0.76 0.66 0.75  --   CALCIUM  8.9 8.6 8.3* 7.8*  --   TSH 20.61* 3.66  --   --  4.56*   Liver Function Tests: Recent Labs    02/25/24 1514  03/11/24 1349 07/01/24 1219  AST 27 25 30   ALT 15 17 16   ALKPHOS  --   --  69  BILITOT 0.6 0.7 1.3*  PROT 6.5 6.5 6.7  ALBUMIN  --   --  3.4*   Recent Labs    03/11/24 1349 07/01/24 1219  LIPASE 47 34  AMYLASE 31  --    No results for input(s): AMMONIA in the last 8760 hours. CBC: Recent Labs    02/25/24 1514 03/11/24 1349 07/01/24 1219  WBC 5.7 4.8 5.6  NEUTROABS  --  2,462  --   HGB 13.3 12.5 13.8  HCT 40.7 39.5 40.7  MCV 96.0 98.3 92.9  PLT 290 201 202   Lipid Panel: No results for input(s): CHOL, HDL, LDLCALC, TRIG, CHOLHDL, LDLDIRECT in the last 8760 hours. TSH: Recent Labs    09/18/23 1404 02/25/24 1514 07/29/24 1627  TSH 20.61* 3.66 4.56*   A1C: Lab Results  Component Value Date   HGBA1C 5.8 (H) 07/09/2022     Assessment/Plan 1. Postoperative hypothyroidism (Primary) - TSH> 4.56 07/30/2024 - intermittent hot flashes reported - cont levothyroxine    2. Weight loss - BMI 20.44 - current weight 101 lbs> was 109 lbs (06/2023)> was 116 (11/2022) - Ambulatory referral to Hospice  3. Physical deconditioning - ongoing - 11/13 fall without injury> ED evaluation unremarkable - needing more help with ADLs - suspect  advanced age as cause  - Ambulatory referral to Hospice  4. Chronic diastolic heart failure (HCC) - LVEF 60-65% 06/2022 - lung sounds clear, no weight gain or edema - compensated - not on diuretics  5. Permanent atrial fibrillation (HCC) - HR< 100 with atenolol  - cont low dose eliquis  for clot prevention  6. Anxiety - worse with physical decline - no panic attacks - cont buspar  and temazepam    7. Advanced care planning/counseling discussion - goals of care discussed - want comfort based approach - agree to hospice consult due to advanced age, weight loss and physical decline   Total time: 46 minutes. Greater than 50% of total time spent doing patient education regarding physical decline, weight loss, anxiety,  goals of care and hypothyroidism including symptom/medication management.      Next appt: 09/23/2024  Greig Cluster, ELNITA  [facility] [phone]      [1]  Allergies Allergen Reactions   Azithromycin Nausea Only   Compazine [Prochlorperazine Edisylate] Nausea Only   Demerol Nausea Only   Dolophine [Methadone] Nausea Only   Ebastine Nausea Only    EBS   Erythromycin Nausea Only   Gluten Meal Diarrhea and Other (See Comments)    Has Celiac Disease   Ibuprofen Nausea Only   Meperidine Hcl Other (See Comments)    Reaction not noted   Morphine  Sulfate Itching   Other Diarrhea and Other (See Comments)    CELIAC DISEASE   Statins Other (See Comments)    Myalgias    Tetanus Toxoid-Containing Vaccines Nausea Only   Tetracyclines & Related Nausea Only

## 2024-08-09 ENCOUNTER — Telehealth: Payer: Self-pay | Admitting: Orthopedic Surgery

## 2024-08-09 DIAGNOSIS — F5101 Primary insomnia: Secondary | ICD-10-CM

## 2024-08-09 NOTE — Telephone Encounter (Unsigned)
 Copied from CRM #[id number]. Topic: Clinical - Medication Refill >> Aug 09, 2024  3:04 PM Chiquita SQUIBB wrote: Medication: temazepam  (RESTORIL ) 15 MG capsule  Has the patient contacted their pharmacy? Yes- [facility] patients facility is calling.  (Agent: If no, request that the patient contact the pharmacy for the refill. If patient does not wish to contact the pharmacy document the reason why and proceed with request.) (Agent: If yes, when and what did the pharmacy advise?)  This is the patient's preferred pharmacy:   [facility] - [place], [place] - [address] [place] [address] Phone: [phone] Fax: [phone]  Is this the correct pharmacy for this prescription? Yes If no, delete pharmacy and type the correct one.   Has the prescription been filled recently? No  Is the patient out of the medication? No  Has the patient been seen for an appointment in the last year OR does the patient have an upcoming appointment? Yes  Can we respond through MyChart? No  Agent: Please be advised that Rx refills may take up to 3 business days. We ask that you follow-up with your pharmacy.

## 2024-08-10 MED ORDER — TEMAZEPAM 15 MG PO CAPS: 15.0000 mg | ORAL_CAPSULE | Freq: Every evening | ORAL | 1 refills | Status: AC | PRN

## 2024-08-10 NOTE — Telephone Encounter (Signed)
 Patient requested refill.  Epic LR: 05/28/2024  Pended Rx and sent to Greig Cluster, NP for approval.

## 2024-08-16 ENCOUNTER — Other Ambulatory Visit: Payer: Self-pay | Admitting: Orthopedic Surgery

## 2024-08-16 DIAGNOSIS — R634 Abnormal weight loss: Secondary | ICD-10-CM

## 2024-09-18 ENCOUNTER — Encounter: Payer: Self-pay | Admitting: Orthopedic Surgery

## 2024-09-23 ENCOUNTER — Ambulatory Visit: Payer: Self-pay | Admitting: Orthopedic Surgery

## 2024-09-23 ENCOUNTER — Ambulatory Visit: Admitting: Orthopedic Surgery
